# Patient Record
Sex: Female | Born: 1940
Health system: Southern US, Community
[De-identification: ages and names within clinical notes are randomized; demographics above are authoritative.]

## PROBLEM LIST (undated history)

## (undated) DIAGNOSIS — Z87898 Personal history of other specified conditions: Secondary | ICD-10-CM

## (undated) DIAGNOSIS — K219 Gastro-esophageal reflux disease without esophagitis: Secondary | ICD-10-CM

## (undated) DIAGNOSIS — M810 Age-related osteoporosis without current pathological fracture: Secondary | ICD-10-CM

## (undated) DIAGNOSIS — F418 Other specified anxiety disorders: Secondary | ICD-10-CM

## (undated) DIAGNOSIS — E039 Hypothyroidism, unspecified: Secondary | ICD-10-CM

## (undated) DIAGNOSIS — F419 Anxiety disorder, unspecified: Secondary | ICD-10-CM

## (undated) DIAGNOSIS — I4891 Unspecified atrial fibrillation: Secondary | ICD-10-CM

## (undated) HISTORY — DX: Other specified anxiety disorders: F41.8

## (undated) HISTORY — PX: VARICOSE VEIN SURGERY: SHX832

## (undated) HISTORY — DX: Unspecified atrial fibrillation: I48.91

## (undated) HISTORY — DX: Anxiety disorder, unspecified: F41.9

## (undated) HISTORY — PX: TOTAL ABDOMINAL HYSTERECTOMY: SHX209

## (undated) HISTORY — PX: MOUTH SURGERY: SHX715

## (undated) HISTORY — DX: Personal history of other specified conditions: Z87.898

## (undated) HISTORY — DX: Gastro-esophageal reflux disease without esophagitis: K21.9

## (undated) HISTORY — PX: SHOULDER ARTHROSCOPY: SHX128

## (undated) HISTORY — DX: Hemochromatosis, unspecified: E83.119

## (undated) HISTORY — DX: Hypothyroidism, unspecified: E03.9

## (undated) HISTORY — PX: COLONOSCOPY: SHX174

## (undated) HISTORY — PX: UPPER GASTROINTESTINAL ENDOSCOPY: SHX188

---

## 2003-07-09 ENCOUNTER — Encounter (INDEPENDENT_AMBULATORY_CARE_PROVIDER_SITE_OTHER): Payer: Self-pay | Admitting: *Deleted

## 2003-07-09 ENCOUNTER — Ambulatory Visit (HOSPITAL_COMMUNITY): Admission: RE | Admit: 2003-07-09 | Discharge: 2003-07-09 | Payer: Self-pay | Admitting: Surgery

## 2004-05-28 ENCOUNTER — Encounter: Admission: RE | Admit: 2004-05-28 | Discharge: 2004-05-28 | Payer: Self-pay | Admitting: Emergency Medicine

## 2004-10-26 ENCOUNTER — Ambulatory Visit: Payer: Self-pay | Admitting: Internal Medicine

## 2004-10-29 ENCOUNTER — Ambulatory Visit: Payer: Self-pay

## 2004-11-02 ENCOUNTER — Encounter (INDEPENDENT_AMBULATORY_CARE_PROVIDER_SITE_OTHER): Payer: Self-pay | Admitting: Specialist

## 2004-11-02 ENCOUNTER — Ambulatory Visit (HOSPITAL_BASED_OUTPATIENT_CLINIC_OR_DEPARTMENT_OTHER): Admission: RE | Admit: 2004-11-02 | Discharge: 2004-11-02 | Payer: Self-pay | Admitting: Otolaryngology

## 2004-11-02 ENCOUNTER — Ambulatory Visit (HOSPITAL_COMMUNITY): Admission: RE | Admit: 2004-11-02 | Discharge: 2004-11-02 | Payer: Self-pay | Admitting: Otolaryngology

## 2004-11-05 ENCOUNTER — Ambulatory Visit: Payer: Self-pay

## 2005-03-17 ENCOUNTER — Ambulatory Visit (HOSPITAL_COMMUNITY): Admission: RE | Admit: 2005-03-17 | Discharge: 2005-03-17 | Payer: Self-pay | Admitting: *Deleted

## 2005-03-17 ENCOUNTER — Encounter (INDEPENDENT_AMBULATORY_CARE_PROVIDER_SITE_OTHER): Payer: Self-pay | Admitting: *Deleted

## 2005-07-03 ENCOUNTER — Emergency Department (HOSPITAL_COMMUNITY): Admission: EM | Admit: 2005-07-03 | Discharge: 2005-07-03 | Payer: Self-pay | Admitting: Emergency Medicine

## 2006-01-16 ENCOUNTER — Ambulatory Visit: Payer: Self-pay | Admitting: Internal Medicine

## 2006-11-15 ENCOUNTER — Ambulatory Visit: Payer: Self-pay | Admitting: Internal Medicine

## 2006-11-22 ENCOUNTER — Ambulatory Visit: Payer: Self-pay

## 2008-03-12 ENCOUNTER — Ambulatory Visit: Payer: Self-pay | Admitting: Internal Medicine

## 2008-03-17 ENCOUNTER — Ambulatory Visit: Payer: Self-pay | Admitting: Internal Medicine

## 2008-04-10 ENCOUNTER — Ambulatory Visit: Payer: Self-pay | Admitting: Internal Medicine

## 2008-06-09 ENCOUNTER — Encounter: Admission: RE | Admit: 2008-06-09 | Discharge: 2008-06-09 | Payer: Self-pay | Admitting: Emergency Medicine

## 2008-10-20 DIAGNOSIS — I4891 Unspecified atrial fibrillation: Secondary | ICD-10-CM

## 2008-10-20 DIAGNOSIS — R55 Syncope and collapse: Secondary | ICD-10-CM

## 2008-10-20 DIAGNOSIS — I491 Atrial premature depolarization: Secondary | ICD-10-CM

## 2008-10-23 ENCOUNTER — Ambulatory Visit: Payer: Self-pay | Admitting: Internal Medicine

## 2009-05-18 ENCOUNTER — Ambulatory Visit: Payer: Self-pay | Admitting: Oncology

## 2009-06-15 ENCOUNTER — Encounter: Payer: Self-pay | Admitting: Internal Medicine

## 2009-06-15 LAB — CBC & DIFF AND RETIC
BASO%: 1.2 % (ref 0.0–2.0)
HCT: 37.6 % (ref 34.8–46.6)
Immature Retic Fract: 4.7 % (ref 0.00–10.70)
MCHC: 34.3 g/dL (ref 31.5–36.0)
MONO#: 0.5 10*3/uL (ref 0.1–0.9)
NEUT%: 56.6 % (ref 38.4–76.8)
RBC: 3.73 10*6/uL (ref 3.70–5.45)
Retic %: 0.98 % (ref 0.50–1.50)
Retic Ct Abs: 36.55 10*3/uL (ref 18.30–72.70)
WBC: 5.8 10*3/uL (ref 3.9–10.3)
lymph#: 1.6 10*3/uL (ref 0.9–3.3)
nRBC: 0 % (ref 0–0)

## 2009-06-15 LAB — IRON AND TIBC
Iron: 177 ug/dL — ABNORMAL HIGH (ref 42–145)
TIBC: 259 ug/dL (ref 250–470)
UIBC: 82 ug/dL

## 2009-06-15 LAB — COMPREHENSIVE METABOLIC PANEL
ALT: 24 U/L (ref 0–35)
AST: 25 U/L (ref 0–37)
Alkaline Phosphatase: 43 U/L (ref 39–117)
Creatinine, Ser: 0.61 mg/dL (ref 0.40–1.20)
Total Bilirubin: 0.6 mg/dL (ref 0.3–1.2)

## 2009-06-15 LAB — SEDIMENTATION RATE: Sed Rate: 7 mm/hr (ref 0–22)

## 2009-06-15 LAB — LACTATE DEHYDROGENASE: LDH: 169 U/L (ref 94–250)

## 2009-07-08 ENCOUNTER — Encounter (HOSPITAL_COMMUNITY): Admission: RE | Admit: 2009-07-08 | Discharge: 2009-10-06 | Payer: Self-pay | Admitting: Oncology

## 2009-07-31 ENCOUNTER — Encounter: Admission: RE | Admit: 2009-07-31 | Discharge: 2009-07-31 | Payer: Self-pay | Admitting: Emergency Medicine

## 2009-10-07 ENCOUNTER — Encounter: Admission: RE | Admit: 2009-10-07 | Discharge: 2009-10-07 | Payer: Self-pay | Admitting: Emergency Medicine

## 2009-10-09 ENCOUNTER — Encounter (HOSPITAL_COMMUNITY): Admission: RE | Admit: 2009-10-09 | Discharge: 2010-01-07 | Payer: Self-pay | Admitting: Oncology

## 2009-10-12 ENCOUNTER — Encounter: Admission: RE | Admit: 2009-10-12 | Discharge: 2009-10-12 | Payer: Self-pay | Admitting: Emergency Medicine

## 2009-12-04 ENCOUNTER — Ambulatory Visit: Payer: Self-pay | Admitting: Oncology

## 2009-12-08 LAB — CBC & DIFF AND RETIC
BASO%: 0.7 % (ref 0.0–2.0)
Basophils Absolute: 0 10*3/uL (ref 0.0–0.1)
EOS%: 2.5 % (ref 0.0–7.0)
Eosinophils Absolute: 0.1 10*3/uL (ref 0.0–0.5)
HCT: 36.2 % (ref 34.8–46.6)
HGB: 12.4 g/dL (ref 11.6–15.9)
Immature Retic Fract: 5.8 % (ref 0.00–10.70)
LYMPH%: 26.5 % (ref 14.0–49.7)
MCH: 34.9 pg — ABNORMAL HIGH (ref 25.1–34.0)
MCHC: 34.3 g/dL (ref 31.5–36.0)
MCV: 102 fL — ABNORMAL HIGH (ref 79.5–101.0)
MONO#: 0.4 10*3/uL (ref 0.1–0.9)
MONO%: 7.6 % (ref 0.0–14.0)
NEUT#: 3.5 10*3/uL (ref 1.5–6.5)
NEUT%: 62.7 % (ref 38.4–76.8)
Platelets: 211 10*3/uL (ref 145–400)
RBC: 3.55 10*6/uL — ABNORMAL LOW (ref 3.70–5.45)
RDW: 12.9 % (ref 11.2–14.5)
Retic %: 1.48 % (ref 0.50–1.50)
Retic Ct Abs: 52.54 10*3/uL (ref 18.30–72.70)
WBC: 5.5 10*3/uL (ref 3.9–10.3)
lymph#: 1.5 10*3/uL (ref 0.9–3.3)

## 2009-12-08 LAB — IRON AND TIBC
%SAT: 35 % (ref 20–55)
Iron: 90 ug/dL (ref 42–145)
TIBC: 256 ug/dL (ref 250–470)
UIBC: 166 ug/dL

## 2009-12-08 LAB — COMPREHENSIVE METABOLIC PANEL
ALT: 20 U/L (ref 0–35)
AST: 23 U/L (ref 0–37)
Albumin: 4.3 g/dL (ref 3.5–5.2)
Alkaline Phosphatase: 50 U/L (ref 39–117)
BUN: 11 mg/dL (ref 6–23)
CO2: 24 mEq/L (ref 19–32)
Calcium: 8.9 mg/dL (ref 8.4–10.5)
Chloride: 105 mEq/L (ref 96–112)
Creatinine, Ser: 0.63 mg/dL (ref 0.40–1.20)
Glucose, Bld: 127 mg/dL — ABNORMAL HIGH (ref 70–99)
Potassium: 3.5 mEq/L (ref 3.5–5.3)
Sodium: 141 mEq/L (ref 135–145)
Total Bilirubin: 0.4 mg/dL (ref 0.3–1.2)
Total Protein: 6.6 g/dL (ref 6.0–8.3)

## 2009-12-08 LAB — LACTATE DEHYDROGENASE: LDH: 184 U/L (ref 94–250)

## 2009-12-08 LAB — FERRITIN: Ferritin: 117 ng/mL (ref 10–291)

## 2009-12-15 ENCOUNTER — Encounter: Payer: Self-pay | Admitting: Internal Medicine

## 2010-02-04 ENCOUNTER — Encounter (HOSPITAL_COMMUNITY)
Admission: RE | Admit: 2010-02-04 | Discharge: 2010-03-23 | Payer: Self-pay | Source: Home / Self Care | Admitting: Oncology

## 2010-03-16 ENCOUNTER — Encounter: Admission: RE | Admit: 2010-03-16 | Discharge: 2010-03-16 | Payer: Self-pay | Admitting: Emergency Medicine

## 2010-04-22 ENCOUNTER — Ambulatory Visit: Payer: Self-pay | Admitting: Internal Medicine

## 2010-04-26 ENCOUNTER — Encounter (HOSPITAL_COMMUNITY)
Admission: RE | Admit: 2010-04-26 | Discharge: 2010-07-25 | Payer: Self-pay | Source: Home / Self Care | Attending: Oncology | Admitting: Oncology

## 2010-06-04 ENCOUNTER — Ambulatory Visit: Payer: Self-pay | Admitting: Oncology

## 2010-06-08 LAB — COMPREHENSIVE METABOLIC PANEL
ALT: 12 U/L (ref 0–35)
AST: 17 U/L (ref 0–37)
Albumin: 4.1 g/dL (ref 3.5–5.2)
Alkaline Phosphatase: 55 U/L (ref 39–117)
BUN: 14 mg/dL (ref 6–23)
CO2: 26 mEq/L (ref 19–32)
Calcium: 9.3 mg/dL (ref 8.4–10.5)
Chloride: 106 mEq/L (ref 96–112)
Creatinine, Ser: 0.7 mg/dL (ref 0.40–1.20)
Glucose, Bld: 91 mg/dL (ref 70–99)
Potassium: 4.3 mEq/L (ref 3.5–5.3)
Sodium: 141 mEq/L (ref 135–145)
Total Bilirubin: 0.3 mg/dL (ref 0.3–1.2)
Total Protein: 6.6 g/dL (ref 6.0–8.3)

## 2010-06-08 LAB — LACTATE DEHYDROGENASE: LDH: 172 U/L (ref 94–250)

## 2010-06-08 LAB — CBC & DIFF AND RETIC
BASO%: 0.6 % (ref 0.0–2.0)
Basophils Absolute: 0 10*3/uL (ref 0.0–0.1)
EOS%: 3 % (ref 0.0–7.0)
Eosinophils Absolute: 0.2 10*3/uL (ref 0.0–0.5)
HCT: 36.2 % (ref 34.8–46.6)
HGB: 12.6 g/dL (ref 11.6–15.9)
Immature Retic Fract: 5.4 % (ref 0.00–10.70)
LYMPH%: 24.3 % (ref 14.0–49.7)
MCH: 34.9 pg — ABNORMAL HIGH (ref 25.1–34.0)
MCHC: 34.8 g/dL (ref 31.5–36.0)
MCV: 100.3 fL (ref 79.5–101.0)
MONO#: 0.4 10*3/uL (ref 0.1–0.9)
MONO%: 6.9 % (ref 0.0–14.0)
NEUT#: 4.2 10*3/uL (ref 1.5–6.5)
NEUT%: 65.2 % (ref 38.4–76.8)
Platelets: 243 10*3/uL (ref 145–400)
RBC: 3.61 10*6/uL — ABNORMAL LOW (ref 3.70–5.45)
RDW: 12.7 % (ref 11.2–14.5)
Retic %: 0.7 % (ref 0.50–1.50)
Retic Ct Abs: 25.27 10*3/uL (ref 18.30–72.70)
WBC: 6.4 10*3/uL (ref 3.9–10.3)
lymph#: 1.6 10*3/uL (ref 0.9–3.3)
nRBC: 0 % (ref 0–0)

## 2010-06-08 LAB — IRON AND TIBC
%SAT: 46 % (ref 20–55)
Iron: 114 ug/dL (ref 42–145)
TIBC: 248 ug/dL — ABNORMAL LOW (ref 250–470)
UIBC: 134 ug/dL

## 2010-06-08 LAB — FERRITIN: Ferritin: 36 ng/mL (ref 10–291)

## 2010-06-15 ENCOUNTER — Encounter: Payer: Self-pay | Admitting: Internal Medicine

## 2010-07-15 ENCOUNTER — Encounter
Admission: RE | Admit: 2010-07-15 | Discharge: 2010-07-15 | Payer: Self-pay | Source: Home / Self Care | Attending: Emergency Medicine | Admitting: Emergency Medicine

## 2010-07-21 LAB — HEMOGLOBIN AND HEMATOCRIT, BLOOD
HCT: 41.6 % (ref 36.0–46.0)
Hemoglobin: 14.3 g/dL (ref 12.0–15.0)

## 2010-07-21 LAB — FERRITIN: Ferritin: 60 ng/mL (ref 10–291)

## 2010-07-27 NOTE — Letter (Signed)
[facility]   Imported By: Roderic Ovens 07/20/2009 10:16:47  _____________________________________________________________________  External Attachment:    Type:   Image     Comment:   External Document

## 2010-07-27 NOTE — Letter (Signed)
[facility]   Imported By: Roderic Ovens 01/29/2010 14:02:22  _____________________________________________________________________  External Attachment:    Type:   Image     Comment:   External Document

## 2010-07-27 NOTE — Assessment & Plan Note (Signed)
Summary: pt wants checkup/past due appt   CC:  check up.  History of Present Illness: [name] is seen in followup for atrial fibrillation. This is the largely quiescent on  flecainide therapy.   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   She is now undergoing phlebotomy monthly     He does have occasional palpitations associated with lightheadedness. We tried to use a 30 day event recorder in t. Unfortunately we did not get a very good data and the one date on which he had symptoms she had only an isolated PAC.  The symptoms occur primarily while standing; it occurred occasionally while sitting and driving.  Current Medications (verified): 1)  Flecainide Acetate 100 Mg Tabs (Flecainide Acetate) .... Take 1 Tablet Twice A Day 2)  Synthroid 112 Mcg Tabs (Levothyroxine Sodium) .... Take 1 By Mouth Once Daily 3)  Nexium 40 Mg Cpdr (Esomeprazole Magnesium) .... Take 1 By Mouth Once Daily 4)  Folic Acid 1 Mg Tabs (Folic Acid) .... Take 1 By Mouth Once Daily 5)  B-12 Injection .... Every Month 6)  Vitamin D .... Weekly 7)  Caltrate 600 1500 Mg Tabs (Calcium Carbonate) .... Take Once Daily 8)  Celexa 10 Mg Tabs (Citalopram Hydrobromide) .Marland Kitchen.. 1 Tab By Mouth Once Daily 9)  Aspirin 81 Mg Tbec (Aspirin) .... Take One Tablet By Mouth Daily  Allergies: No Known Drug Allergies  Past History:  Past Surgical History: Last updated: 10/20/2008 Tonsillectomy hysterectomy - 1971  Social History: Last updated: 10/20/2008 Retired  Tobacco Use - Former. - 1999 Alcohol Use - yes - 2 daily Regular Exercise - yes Drug Use - no  Past Medical History: Atrial fibrillation Syncope Hemochromatosis-homozygous-C282Y gene  Family History: hemochromatosis  Vital Signs:  Patient profile:   70 year  old female Height:      64 inches Weight:      143 pounds BMI:     24.63 Pulse rate:   70 / minute Resp:     12 per minute BP sitting:   118 / 78  (left arm)  Vitals Entered By: Kem Parkinson (April 22, 2010 11:24 AM)  Physical Exam  General:  The patient was alert and oriented in no acute distress. HEENT Normal.  Neck veins were flat, carotids were brisk.  Lungs were clear.  Heart sounds were regular without murmurs or gallops.  Abdomen was soft with active bowel sounds. There is no clubbing cyanosis or edema. Skin Warm and dry    EKG  Procedure date:  04/22/2010  Findings:      sinus rhythm at 70 Intervals 0.16/0.07/0.40 Axis is -17 Low-voltage  Impression & Recommendations:  Problem # 1:  ATRIAL FIBRILLATION (ICD-427.31) stabel Her updated medication list for this problem includes:    Flecainide Acetate 100 Mg Tabs (Flecainide acetate) .Marland Kitchen... Take 1 tablet twice a day    Aspirin 81 Mg Tbec (Aspirin) .Marland Kitchen... Take one tablet by mouth daily  Orders: EKG w/ Interpretation (93000)  Problem # 2:  HEREDITARY HEMOCHROMATOSIS (ICD-275.01) we discussed at great length hemochromatosis and its potential effects. Holding to further review it is a mildly low voltage might be a manifestation although the current therapy of phlebotomy is clearly won't be begun in any case.  Patient Instructions: 1)  Your physician recommends that you schedule  a follow-up appointment in: YEAR WITH DR Graciela Husbands 2)  Your physician recommends that you continue on your current medications as directed. Please refer to the Current Medication list given to you today.

## 2010-07-29 NOTE — Letter (Signed)
Summary: [facility]  [facility]   Imported By: Lester Woodbury 06/30/2010 10:22:06  _____________________________________________________________________  External Attachment:    Type:   Image     Comment:   External Document

## 2010-08-04 ENCOUNTER — Other Ambulatory Visit: Payer: Self-pay | Admitting: Emergency Medicine

## 2010-08-04 DIAGNOSIS — M25551 Pain in right hip: Secondary | ICD-10-CM

## 2010-08-07 ENCOUNTER — Other Ambulatory Visit: Payer: Self-pay

## 2010-08-11 ENCOUNTER — Ambulatory Visit
Admission: RE | Admit: 2010-08-11 | Discharge: 2010-08-11 | Disposition: A | Discharge status: Home / Self Care | Payer: Medicare Other | Source: Ambulatory Visit | Attending: Emergency Medicine | Admitting: Emergency Medicine

## 2010-08-11 DIAGNOSIS — M25551 Pain in right hip: Secondary | ICD-10-CM

## 2010-08-20 ENCOUNTER — Telehealth: Payer: Self-pay | Admitting: Internal Medicine

## 2010-09-02 NOTE — Progress Notes (Signed)
Summary: pt calling re heat waves-sob  Phone Note Call from Patient   Caller: Patient [phone] or [phone] Reason for Call: Talk to Nurse Summary of Call: feeling like heat waves-sob-x 2 weeks-denies chest pain-not sure heart related- Initial call taken by: Glynda Jaeger,  August 20, 2010 10:35 AM  Follow-up for Phone Call        Phone Call Completed SPOKE WITH PT FEELS OKAY AT THIS TIME S/S NOTED OVER 2 WEEK PERIOD. INSTRUCTED TO F/U WITH PMD DOES NOT SOUND HEART RELATED. PER PT NO CHESTPAIN,B/P RUNNING GOOD ,FEELS HEART IS IN NORMAL RYTHM NO SWELLING NOTED PT VERBALIZED UNDERSTANDING . INFORMED TO CALL BACK I F S/S INCREASE OR CHANGE . Follow-up by: Scherrie Bateman, LPN,  August 20, 2010 11:34 AM  Additional Follow-up for Phone Call Additional follow up Details #1::        agree  Additional Follow-up by: Nathen May, MD, [facility],  August 23, 2010 6:21 PM

## 2010-09-07 LAB — FERRITIN: Ferritin: 80 ng/mL (ref 10–291)

## 2010-09-07 LAB — HEMOGLOBIN AND HEMATOCRIT, BLOOD
HCT: 39.3 % (ref 36.0–46.0)
Hemoglobin: 13.6 g/dL (ref 12.0–15.0)

## 2010-09-10 LAB — FERRITIN: Ferritin: 58 ng/mL (ref 10–291)

## 2010-09-10 LAB — HEMOGLOBIN AND HEMATOCRIT, BLOOD
HCT: 40 % (ref 36.0–46.0)
Hemoglobin: 14.1 g/dL (ref 12.0–15.0)

## 2010-09-12 LAB — HEMOGLOBIN AND HEMATOCRIT, BLOOD
HCT: 37.9 % (ref 36.0–46.0)
Hemoglobin: 12.9 g/dL (ref 12.0–15.0)

## 2010-09-12 LAB — FERRITIN: Ferritin: 253 ng/mL (ref 10–291)

## 2010-09-13 LAB — HEMOGLOBIN AND HEMATOCRIT, BLOOD
HCT: 40.1 % (ref 36.0–46.0)
Hemoglobin: 13.7 g/dL (ref 12.0–15.0)

## 2010-09-13 LAB — FERRITIN: Ferritin: 216 ng/mL (ref 10–291)

## 2010-09-14 LAB — HEMOGLOBIN AND HEMATOCRIT, BLOOD: HCT: 39.5 % (ref 36.0–46.0)

## 2010-09-16 LAB — HEMOGLOBIN AND HEMATOCRIT, BLOOD: Hemoglobin: 12.7 g/dL (ref 12.0–15.0)

## 2010-09-16 LAB — FERRITIN: Ferritin: 248 ng/mL (ref 10–291)

## 2010-10-05 ENCOUNTER — Ambulatory Visit
Admission: RE | Admit: 2010-10-05 | Discharge: 2010-10-05 | Disposition: A | Discharge status: Home / Self Care | Payer: Medicare Other | Source: Ambulatory Visit | Attending: Emergency Medicine | Admitting: Emergency Medicine

## 2010-10-05 ENCOUNTER — Encounter: Payer: Self-pay | Admitting: Internal Medicine

## 2010-10-05 ENCOUNTER — Encounter: Payer: Self-pay | Admitting: Physician Assistant

## 2010-10-05 ENCOUNTER — Other Ambulatory Visit: Payer: Self-pay | Admitting: Emergency Medicine

## 2010-10-05 DIAGNOSIS — R072 Precordial pain: Secondary | ICD-10-CM

## 2010-10-06 ENCOUNTER — Encounter: Payer: Self-pay | Admitting: Physician Assistant

## 2010-10-06 ENCOUNTER — Other Ambulatory Visit: Payer: Self-pay | Admitting: Emergency Medicine

## 2010-10-06 ENCOUNTER — Ambulatory Visit (INDEPENDENT_AMBULATORY_CARE_PROVIDER_SITE_OTHER): Payer: Medicare Other | Admitting: Physician Assistant

## 2010-10-06 VITALS — BP 112/80 | HR 78 | Ht 64.0 in | Wt 142.0 lb

## 2010-10-06 DIAGNOSIS — R110 Nausea: Secondary | ICD-10-CM

## 2010-10-06 DIAGNOSIS — R079 Chest pain, unspecified: Secondary | ICD-10-CM

## 2010-10-06 DIAGNOSIS — R0602 Shortness of breath: Secondary | ICD-10-CM | POA: Insufficient documentation

## 2010-10-06 DIAGNOSIS — E039 Hypothyroidism, unspecified: Secondary | ICD-10-CM | POA: Insufficient documentation

## 2010-10-06 DIAGNOSIS — R002 Palpitations: Secondary | ICD-10-CM

## 2010-10-06 DIAGNOSIS — I4891 Unspecified atrial fibrillation: Secondary | ICD-10-CM

## 2010-10-06 NOTE — Assessment & Plan Note (Signed)
She is currently maintaining sinus rhythm.  If her monitor demonstrates more frequent atrial fibrillation and her Myoview study rules out ischemic heart disease, she may be able to increase her flecainide.

## 2010-10-06 NOTE — Assessment & Plan Note (Addendum)
Her chest symptoms are somewhat atypical for ischemia.  However, they were severe.  Her EKG is unchanged.  She does have hemochromatosis.  I would think that she is at risk for pulmonary emboli.  She did have a recent trip to [place].  Her PCP did order a d-dimer.  Those results are not back yet.  I would have a low threshold to proceed with chest CT to rule out pulmonary embolism.  Given her family history, I would suggest that we proceed with a stress Myoview study.  If she has ischemic heart disease, she would not be a candidate for flecainide, which he is currently taking.  I will have her follow up with Dr. Graciela Husbands in the next couple weeks.  She knows to go to the emergency room if she has recurrent or severe symptoms.  I did speak to Dr. Lorenz Coaster.  Her DDimer was negative and her troponin was negative.

## 2010-10-06 NOTE — Patient Instructions (Signed)
Your physician has requested that you have en exercise stress Myoview 786.50, 786.05. For further information please visit [email or web address]. Please follow instruction sheet, as given.  Your physician has requested that you have an echocardiogram 786.05, 427.31. Echocardiography is a painless test that uses sound waves to create images of your heart. It provides your doctor with information about the size and shape of your heart and how well your heart's chambers and valves are working. This procedure takes approximately one hour. There are no restrictions for this procedure.   Your physician has recommended that you wear an event monitor 785.1. Event monitors are medical devices that record the heart's electrical activity. Doctors most often [name] these monitors to diagnose arrhythmias. Arrhythmias are problems with the speed or rhythm of the heartbeat. The monitor is a small, portable device. You can wear one while you do your normal daily activities. This is usually used to diagnose what is causing palpitations/syncope (passing out).  Your physician recommends that you schedule a follow-up appointment in: 2 WEEKS WITH DR. Graciela Husbands AS PER SCOTT WEAVER, PA-C.

## 2010-10-06 NOTE — Assessment & Plan Note (Signed)
She has noted some increased shortness of breath recently.  Her volume is stable on exam.  I will obtain an echocardiogram as well.

## 2010-10-06 NOTE — Assessment & Plan Note (Signed)
She may be having recurrent atrial fibrillation.  It has been over 2 years since she last wore a monitor.  We will obtain a 21 day event monitor as well.  I have asked her PCP to also add a TSH to her labs.

## 2010-10-06 NOTE — Progress Notes (Signed)
History of Present Illness: Primary Electrophysiologist:  Dr. Sherryl Manges  [name] is a 70 y.o. female with a h/o atrial fibrillation largely controlled by Flecainide and hereditary hemochromatosis.  Echo done in 2006 demonstrated normal LVF.  Myoview in 2008 was low risk.  When last seen in 03/2010 by Dr. Graciela Husbands she was maintaining NSR.  She is referred back by her PCP for chest pain.    3 days ago, she developed sharp substernal chest pain while at rest.  It radiated to her back.  She felt like she pulled a muscle.  The pain lasted most of the day.  It occurred again yesterday while she was just pouring coffee.  It again occurred throughout the day.  She had some diaphoresis and shortness of breath.  She denies syncope.  She denies exertional chest pain.  She does have some dyspnea with exertion.  This seems to have been present over the last couple of months.  She probably describes NYHA class II symptoms.  She has had chronic palpitations over the years even while on flecainide.  However, over the last 6 months or so they've increased in frequency.  She denies orthopnea, PND or significant pedal edema.  She denies hemoptysis.  Of note, she did travel to [place] a few weeks ago by airplane.  EKG done at her PCP's office yesterday was non-acute.  CXR done was also unremarkable.  She had some labs drawn but we do not have those results yet.  Past Medical History  Diagnosis Date  . Atrial fibrillation     Flecainide therapy  . Hemochromatosis     indentified by the C282Y gene mutation; Dr. Cyndie Chime  . History of syncope   . Hypothyroidism   . Depression with anxiety   . GERD (gastroesophageal reflux disease)    Past Surgical History  Procedure Date  . Total abdominal hysterectomy     Current Outpatient Prescriptions  Medication Sig Dispense Refill  . aspirin 81 MG EC tablet Take 81 mg by mouth daily.        Marland Kitchen buPROPion (WELLBUTRIN SR) 150 MG 12 hr tablet Take 150 mg by mouth 2  (two) times daily.        . Calcium Carbonate 1500 MG TABS Take 1 tablet by mouth daily.        . Cholecalciferol (VITAMIN D) 1000 UNITS capsule Take 1,000 Units by mouth daily.        Marland Kitchen esomeprazole (NEXIUM) 40 MG capsule Take 40 mg by mouth daily before breakfast.        . flecainide (TAMBOCOR) 100 MG tablet Take 100 mg by mouth. 1/2 tablet in the morning and 1/2 tablet in the evening        . folic acid (FOLVITE) 1 MG tablet Take 1 mg by mouth daily.        Marland Kitchen levothyroxine (SYNTHROID, LEVOTHROID) 112 MCG tablet Take 112 mcg by mouth daily.        Marland Kitchen VITAMIN B1-B12 IM Inject 1,000 mcg into the muscle every 30 (thirty) days.        Marland Kitchen DISCONTD: citalopram (CELEXA) 10 MG tablet Take 10 mg by mouth daily.        Marland Kitchen DISCONTD: ergocalciferol (VITAMIN D2) 50000 UNITS capsule Take 50,000 Units by mouth once a week.        Marland Kitchen DISCONTD: flecainide (TAMBOCOR) 100 MG tablet Take 100 mg by mouth 2 (two) times daily.          No  Known Allergies  History  Substance Use Topics  . Smoking status: Former Smoker    Quit date: 06/27/1997  . Smokeless tobacco: Not on file  . Alcohol Use: 0.5 oz/week    1 drink(s) per week     2 DRINKS DAILY    Family History  Problem Relation Age of Onset  . Stroke Mother   . Heart disease Father 56  . Hemochromatosis Brother     ROS:  Please see the history of present illness.  She denies fevers, chills, cough, melena, hematochezia, weight changes.  All other systems reviewed and negative.  Vital Signs: BP 112/80  Pulse 78  Ht 5\' 4"  (1.626 m)  Wt 142 lb (64.411 kg)  BMI 24.37 kg/m2  PHYSICAL EXAM: Well nourished, well developed, in no acute distress HEENT: normal Neck: no JVD Vascular: No carotid bruits Endocrine: No thyromegaly Cardiac:  normal S1, S2; RRR; no murmur Lungs:  clear to auscultation bilaterally, no wheezing, rhonchi or rales Abd: soft, nontender, no hepatomegaly Ext: no edema Skin: warm and dry Neuro:  CNs 2-12 intact, no focal  abnormalities noted Psych: Normal affect  EKG:  Normal sinus rhythm, heart rate 78, left axis deviation, nonspecific ST-T wave changes, no significant change when compared to prior tracings  ASSESSMENT AND PLAN:

## 2010-10-07 ENCOUNTER — Encounter: Payer: Self-pay | Admitting: Physician Assistant

## 2010-10-07 ENCOUNTER — Ambulatory Visit
Admission: RE | Admit: 2010-10-07 | Discharge: 2010-10-07 | Disposition: A | Discharge status: Home / Self Care | Payer: Medicare Other | Source: Ambulatory Visit | Attending: Emergency Medicine | Admitting: Emergency Medicine

## 2010-10-07 DIAGNOSIS — R110 Nausea: Secondary | ICD-10-CM

## 2010-10-08 ENCOUNTER — Other Ambulatory Visit: Payer: Self-pay | Admitting: Emergency Medicine

## 2010-10-08 ENCOUNTER — Other Ambulatory Visit (HOSPITAL_COMMUNITY): Payer: Self-pay | Admitting: Emergency Medicine

## 2010-10-08 DIAGNOSIS — R7989 Other specified abnormal findings of blood chemistry: Secondary | ICD-10-CM

## 2010-10-08 DIAGNOSIS — R110 Nausea: Secondary | ICD-10-CM

## 2010-10-08 DIAGNOSIS — D1800 Hemangioma unspecified site: Secondary | ICD-10-CM

## 2010-10-08 DIAGNOSIS — R1011 Right upper quadrant pain: Secondary | ICD-10-CM

## 2010-10-11 ENCOUNTER — Other Ambulatory Visit: Payer: Medicare Other

## 2010-10-12 ENCOUNTER — Other Ambulatory Visit: Payer: Self-pay | Admitting: Oncology

## 2010-10-12 ENCOUNTER — Encounter (HOSPITAL_COMMUNITY): Payer: Medicare Other | Attending: Oncology

## 2010-10-12 LAB — HEMOGLOBIN AND HEMATOCRIT, BLOOD: Hemoglobin: 13.8 g/dL (ref 12.0–15.0)

## 2010-10-14 ENCOUNTER — Encounter (HOSPITAL_COMMUNITY): Payer: Self-pay | Admitting: Radiology

## 2010-10-14 ENCOUNTER — Ambulatory Visit (INDEPENDENT_AMBULATORY_CARE_PROVIDER_SITE_OTHER): Payer: Medicare Other

## 2010-10-14 ENCOUNTER — Ambulatory Visit (HOSPITAL_COMMUNITY): Payer: Medicare Other | Attending: Internal Medicine

## 2010-10-14 ENCOUNTER — Ambulatory Visit (HOSPITAL_BASED_OUTPATIENT_CLINIC_OR_DEPARTMENT_OTHER): Payer: Medicare Other | Admitting: Radiology

## 2010-10-14 DIAGNOSIS — R0609 Other forms of dyspnea: Secondary | ICD-10-CM | POA: Insufficient documentation

## 2010-10-14 DIAGNOSIS — I079 Rheumatic tricuspid valve disease, unspecified: Secondary | ICD-10-CM | POA: Insufficient documentation

## 2010-10-14 DIAGNOSIS — R079 Chest pain, unspecified: Secondary | ICD-10-CM

## 2010-10-14 DIAGNOSIS — I4891 Unspecified atrial fibrillation: Secondary | ICD-10-CM | POA: Insufficient documentation

## 2010-10-14 DIAGNOSIS — R0602 Shortness of breath: Secondary | ICD-10-CM

## 2010-10-14 DIAGNOSIS — I491 Atrial premature depolarization: Secondary | ICD-10-CM

## 2010-10-14 DIAGNOSIS — R002 Palpitations: Secondary | ICD-10-CM

## 2010-10-14 DIAGNOSIS — I059 Rheumatic mitral valve disease, unspecified: Secondary | ICD-10-CM | POA: Insufficient documentation

## 2010-10-14 DIAGNOSIS — R0989 Other specified symptoms and signs involving the circulatory and respiratory systems: Secondary | ICD-10-CM | POA: Insufficient documentation

## 2010-10-14 DIAGNOSIS — R072 Precordial pain: Secondary | ICD-10-CM | POA: Insufficient documentation

## 2010-10-14 MED ORDER — TECHNETIUM TC 99M TETROFOSMIN IV KIT
33.0000 | PACK | Freq: Once | INTRAVENOUS | Status: AC | PRN
  Administered 2010-10-14: 33 via INTRAVENOUS

## 2010-10-14 MED ORDER — TECHNETIUM TC 99M TETROFOSMIN IV KIT
11.0000 | PACK | Freq: Once | INTRAVENOUS | Status: AC | PRN
  Administered 2010-10-14: 11 via INTRAVENOUS

## 2010-10-14 NOTE — Progress Notes (Signed)
[facility] SITE 3 NUCLEAR MED [address] [place] [address] [phone]  Cardiology Nuclear Med Study  [name] is a 70 y.o. female [id number] 1940/11/14   Nuclear Med Background Indication for Stress Test:  Evaluation for Ischemia History: 2006  Echo -Nml and 5/08 Myocardial Perfusion Study-Nml Cardiac Risk Factors: Family History - CAD and History of Smoking  Symptoms:  Chest Pain- last episode-last week, DOE, Fatigue with Exertion, Light-Headedness, Palpitations and SOB   Nuclear Pre-Procedure Caffeine/Decaff Intake:  None NPO After: 7:30pm   Lungs:  clear IV 0.9% NS with Angio Cath:  20g  IV Site: R Antecubital  IV Started by:  Bonnita Levan, RN  Chest Size (in):  38 Cup Size: C  Height: 5\' 4"  (1.626 m)  Weight:  140 lb (63.504 kg)  BMI:  Body mass index is 24.03 kg/(m^2). Tech Comments:  N/A    Nuclear Med Study 1 or 2 day study: 1 day  Stress Test Type:  Stress  Reading MD: Olga Millers, MD  Order Authorizing Provider:  Dr. Berton Mount  Resting Radionuclide: Technetium 41m Tetrofosmin  Resting Radionuclide Dose: 11.0 mCi   Stress Radionuclide:  Technetium 69m Tetrofosmin  Stress Radionuclide Dose: 33.0 mCi           Stress Protocol Rest HR: 75 Stress HR: 144  Rest BP: 132/81 Stress BP: 147/67  Exercise Time (min): 7:32 METS: 9.3     % Max HR: 95.36 bpm    Dose of Adenosine (mg):  n/a Dose of Lexiscan: n/a mg  Dose of Atropine (mg): n/a Dose of Dobutamine: n/a mcg/kg/min (at max HR)  Stress Test Technologist: Frederick Peers, EMT-P  Nuclear Technologist:  Doyne Keel, CNMT     Rest Procedure:  Myocardial perfusion imaging was performed at rest 45 minutes following the intravenous administration of Technetium 53m Tetrofosmin. Rest ECG: NSR  Stress Procedure:  The patient exercised for 7:72mins.  The patient stopped due to SOB/leg fatigue and denied any chest pain(pt felt chest burning"" like when you run"left as quick as she  sat down at rest..  There were non specific ST-T wave changes/occ PACs/PVCS.  Technetium 75m Tetrofosmin was injected at peak exercise and myocardial perfusion imaging was performed after a brief delay. Stress ECG: No significant ST segment change suggestive of ischemia.  QPS Raw Data Images:  Acquisition technically good; normal left ventricular size. Stress Images:  Normal homogeneous uptake in all areas of the myocardium. Rest Images:  Normal homogeneous uptake in all areas of the myocardium. Subtraction (SDS):  No evidence of ischemia. Transient Ischemic Dilatation (Normal <1.22):  0.84 Lung/Heart Ratio (Normal <0.45):  0.30  Quantitative Gated Spect Images QGS EDV:  43 ml QGS ESV:  6 ml QGS cine images:  Normal Wall Motion QGS EF: 86%  Impression Exercise Capacity:  Fair exercise capacity. BP Response:  Normal blood pressure response. Clinical Symptoms:  There is chest burning. ECG Impression:  No significant ST segment change suggestive of ischemia. Comparison with Prior Nuclear Study: No significant change from previous study  Overall Impression:  Normal stress nuclear study.  Olga Millers

## 2010-10-15 NOTE — Progress Notes (Addendum)
Copy routed to Dr. Graciela Husbands.[name]  Please Inform Patient normal study  Thanks

## 2010-10-18 ENCOUNTER — Encounter (HOSPITAL_COMMUNITY)
Admission: RE | Admit: 2010-10-18 | Discharge: 2010-10-18 | Disposition: A | Discharge status: Home / Self Care | Payer: Medicare Other | Source: Ambulatory Visit | Attending: Emergency Medicine | Admitting: Emergency Medicine

## 2010-10-18 DIAGNOSIS — R1011 Right upper quadrant pain: Secondary | ICD-10-CM | POA: Insufficient documentation

## 2010-10-18 DIAGNOSIS — R7402 Elevation of levels of lactic acid dehydrogenase (LDH): Secondary | ICD-10-CM | POA: Insufficient documentation

## 2010-10-18 DIAGNOSIS — R7401 Elevation of levels of liver transaminase levels: Secondary | ICD-10-CM | POA: Insufficient documentation

## 2010-10-18 DIAGNOSIS — R110 Nausea: Secondary | ICD-10-CM | POA: Insufficient documentation

## 2010-10-18 MED ORDER — TECHNETIUM TC 99M MEBROFENIN IV KIT
5.0000 | PACK | Freq: Once | INTRAVENOUS | Status: AC | PRN
  Administered 2010-10-18: 5 via INTRAVENOUS

## 2010-10-21 ENCOUNTER — Ambulatory Visit: Payer: Medicare Other | Admitting: Physician Assistant

## 2010-10-26 ENCOUNTER — Ambulatory Visit (INDEPENDENT_AMBULATORY_CARE_PROVIDER_SITE_OTHER): Payer: Medicare Other | Admitting: Physician Assistant

## 2010-10-26 ENCOUNTER — Encounter: Payer: Self-pay | Admitting: Physician Assistant

## 2010-10-26 VITALS — BP 118/78 | HR 72 | Ht 64.0 in | Wt 140.0 lb

## 2010-10-26 DIAGNOSIS — I4891 Unspecified atrial fibrillation: Secondary | ICD-10-CM

## 2010-10-26 DIAGNOSIS — R079 Chest pain, unspecified: Secondary | ICD-10-CM

## 2010-10-26 NOTE — Assessment & Plan Note (Signed)
Atypical.  With normal Myoview and Echo, this appears to be non-cardiac.  Continue with follow up with PCP to look for other reasons.

## 2010-10-26 NOTE — Patient Instructions (Signed)
Your physician recommends that you schedule a follow-up appointment in: 2 months with Dr Logan Bores may stop wearing the monitor and mail it back in.

## 2010-10-26 NOTE — Progress Notes (Signed)
History of Present Illness: Primary Electrophysiologist:  Dr. Sherryl Manges  [name] is a 70 y.o. female with a h/o atrial fibrillation largely controlled by Flecainide and hereditary hemochromatosis.  Echo done in 2006 demonstrated normal LVF.  Myoview in 2008 was low risk.  When last seen in 03/2010 by Dr. Graciela Husbands she was maintaining NSR.  I saw her 10/06/10 for chest pain.  A d-dimer done by her PCP was negative.  I set her up for a stress Myoview study which was done on 10/14/10 that demonstrated no ischemia and an EF of 86%.  I also obtained an echocardiogram that demonstrated an EF of 55-60%, Grade one diastolic dysfunction and LAE.  She is wearing an event monitor.  This has demonstrated mainly NSR with PACs.  She did have 2 brief episodes of WCT.  I reviewed this with Dr. Graciela Husbands.  He axis did not change and this appeared to be preceded by a PAC.  With normal LVF and no ventricular arrhythmias with exercise during her GXT myoview, this seems to be paroxysmal atrial tachycardia (possibly atrial fibrillation).    She is doing well.  Her PCP has worked her up for gallbladder disease which has been negative.  She did have some elevated LFTs and has follow up pending.  She denies exertional chest pain or dyspnea.  No syncope or near syncope.  No orthopnea or PND or edema.  She is eager to get her monitor off.  Her palpitations are infrequent now.  Past Medical History  Diagnosis Date  . Atrial fibrillation     Flecainide therapy  . Hemochromatosis     indentified by the C282Y gene mutation; Dr. Cyndie Chime  . History of syncope   . Hypothyroidism   . Depression with anxiety   . GERD (gastroesophageal reflux disease)    Past Surgical History  Procedure Date  . Total abdominal hysterectomy     Current Outpatient Prescriptions  Medication Sig Dispense Refill  . aspirin 81 MG EC tablet Take 81 mg by mouth daily.        Marland Kitchen buPROPion (WELLBUTRIN SR) 150 MG 12 hr tablet Take 150 mg by mouth  daily.       . Calcium Carbonate 1500 MG TABS Take 1 tablet by mouth daily.        . Cholecalciferol (VITAMIN D) 1000 UNITS capsule Take 1,000 Units by mouth daily.        Marland Kitchen esomeprazole (NEXIUM) 40 MG capsule Take 40 mg by mouth daily before breakfast.        . flecainide (TAMBOCOR) 100 MG tablet Take 100 mg by mouth. 1/2 tablet in the morning and 1/2 tablet in the evening        . folic acid (FOLVITE) 1 MG tablet Take 1 mg by mouth daily.        Marland Kitchen levothyroxine (SYNTHROID, LEVOTHROID) 112 MCG tablet Take 112 mcg by mouth daily.        Marland Kitchen VITAMIN B1-B12 IM Inject 1,000 mcg into the muscle every 30 (thirty) days.          No Known Allergies  Vital Signs: BP 118/78  Pulse 72  Ht 5\' 4"  (1.626 m)  Wt 140 lb (63.504 kg)  BMI 24.03 kg/m2  PHYSICAL EXAM: Well nourished, well developed, in no acute distress HEENT: normal Neck: no JVD Cardiac:  normal S1, S2; RRR; no murmur Lungs:  clear to auscultation bilaterally, no wheezing, rhonchi or rales Abd: soft, nontender, no hepatomegaly Ext: no edema  Skin: warm and dry Neuro:  CNs 2-12 intact, no focal abnormalities noted  ASSESSMENT AND PLAN:

## 2010-10-26 NOTE — Assessment & Plan Note (Signed)
As noted, she had 2 episodes of wide complex tachycardia that was likely an ectopic atrial tachycardia.  I reviewed this with Dr. Graciela Husbands.  She has normal LV function.  She had no ventricular arrhythmias with exercise during her Myoview.  This is all likely paroxysmal atrial fibrillation or some other type of atrial tachycardia.  I discussed with Dr. Graciela Husbands the possibility of adjusting her flecainide.  However, we will continue her current dose for now.  I will have her follow up with Dr. Graciela Husbands in 2 months.  She knows to return sooner if she has a change in her symptoms.

## 2010-11-01 ENCOUNTER — Telehealth: Payer: Self-pay | Admitting: *Deleted

## 2010-11-01 NOTE — Telephone Encounter (Signed)
PT AWARE OF MYOVIEW RESULTS./CY 

## 2010-11-01 NOTE — Telephone Encounter (Signed)
LMTCB RE NORMAL MYOVIEW .Zack Seal

## 2010-11-01 NOTE — Telephone Encounter (Signed)
Pt rtn your call and you can reach her at [phone]

## 2010-11-02 ENCOUNTER — Telehealth: Payer: Self-pay | Admitting: *Deleted

## 2010-11-02 NOTE — Telephone Encounter (Signed)
PT AWARE OF MYOVIEW RESULTS./CY 

## 2010-11-09 NOTE — Assessment & Plan Note (Signed)
[facility]                         ELECTROPHYSIOLOGY OFFICE NOTE   DONTA, MCINROY                      MRN:          [id number]  DATE:03/12/2008                            DOB:          [birth year]    Ms. Creekmore is seen in followup for atrial fibrillation.  She has  complaints of episodes where she becomes abruptly presyncopal.  These  last only seconds.   She also has episodes where she feels her heart fluttering.  These  former episodes occur every week or two and the others occur almost  daily.  The more concerning spells have occurred while sitting.   Her medications include flecainide 50 b.i.d., Synthroid 112 mcg a day,  she is not on a rate-controlling drug for reasons that are not entirely  clear at this point.   Her blood pressure was 98/60, her pulse was 72.  Her lungs were clear.  Her heart sounds were regular.  Extremities were without edema.   Electrocardiogram demonstrated sinus rhythm at 70 with intervals of  0.16/0.08/0.3.  The axis was leftward of -24.   IMPRESSION:  1. Paroxysmal atrial fibrillation.  2. Recurrent presyncope.  3. Flutters, which I presume were atrial fibrillation.   I am concerned about these episodes of presyncope.  I wonder whether  they are post-termination pauses or vasomotor response to atrial  fibrillation.   We will plan to get event recorders done in about 3 weeks to review it.     Duke Salvia, MD, [facility]  Electronically Signed    SCK/MedQ  DD: 03/12/2008  DT: 03/13/2008  Job #: [id number]   cc:   Reuben Likes, M.D.

## 2010-11-09 NOTE — Assessment & Plan Note (Signed)
[facility]                         ELECTROPHYSIOLOGY OFFICE NOTE   LAASYA, PEYTON                      MRN:          [id number]  DATE:04/10/2008                            DOB:          April 10, 1941    Ms. Hornbaker is seen following an event recorder placed for episodes of  presyncope.  These occurred standing and sitting.  She has a history of  atrial fibrillation.  Unfortunately, we have incomplete data with a most  recent recording been only of through the first week and I have not seen  anything subsequent.  However, she did have 1 of her serious episodes on  March 21, 2008, which was in the record that we had on that day, the  one thing we saw was a single PAC.   Her medications are unchanged.  Her blood pressure today was somewhat  better 111/67, the pulse is 75.  The lungs were clear.  Heart sounds  were regular.  Extremities were without edema.   IMPRESSION:  1. Paroxysmal atrial fibrillation.  2. Recurrent presyncope.  3. Flutters, with all that we could record was PACs.   I am not quite sure what is the explanation for Mrs. Bolds's spells,  We will plan to see her again in 3-4 months.  She is to let us know if  they are more serious.  I told her that I think implantable recorder or  a 30-day event monitor might be helpful in the event that we have a  problem.     Duke Salvia, MD, [facility]  Electronically Signed    SCK/MedQ  DD: 04/10/2008  DT: 04/11/2008  Job #: [id number]

## 2010-11-09 NOTE — Letter (Signed)
Nov 15, 2006    Reuben Likes, M.D.  [address]  [place], [place] [address]   RE:  [name], [name]  MRN:  [id number]  /  DOB:  01-19-1941   Dear Onalee Hua:   It was a pleasure to get your tracing on Ms. Renfro the other day.  I am  always impressed at your astuteness.   I was also very concerned about the electrocardiogram and bring her in  today.  She has had some increasing problems with shortness of breath  but no significant chest discomfort.   We reviewed her previous Myoview scan from 2 years ago that demonstrated  one, both normal left ventricular function; two, normal blood pressure  response to exercise; and three, no evidence of ischemia, however, not  withstanding that the electrocardiographic abnormalities were quite  striking.   She is also complaining of more problems with palpitations.  These were  quite brief, lasting mostly seconds, occasionally up to a minute.  They  were quite different from the atrial fibrillation that was sustained,  that was identified in 2002 of which we have an electrocardiogram.  She  is not sure whether her heart started going faster or slower with these  palpitations.   MEDICATIONS:  1. Flecainide, she is still taking it once a day.  2. Folic acid.  3. Nexium.  4. Synthroid.   EXAMINATION:  Her blood pressure is 102/70, her pulse was 76.  LUNGS:  Clear.  HEART:  Sounds were regular.  EXTREMITIES:  Without edema.   Electrocardiogram dated today demonstrated sinus rhythm at 76 with  interval 0.15/0.08/0.38, the axis was mildly leftward.  There is minor  ST segment flattening and biphasic Q waves in V2 and V3.  The  electrocardiogram from your office last week demonstrated ST segment  depression in leads V1, V2, V3, V4, and V5 with T wave inversions in the  anterior precordium.  There is mild ST segment depression in the  inferior leads.   IMPRESSION:  1. Atrial fibrillation and palpitations, currently on flecainide.  2.  New onset ST segment changes in the anterior precordium that are      quite dynamic in the setting of a previously negative Myoview scan      2 years ago.  3. Flecainide therapy for #1 being taken daily.   Theodoro Grist, I am concerned about the abnormalities on the EKG and I suggest  that we undertake repeat Myoview scanning.  I would have a low threshold  to pursue catheterization if there were any abnormalities evident.   This is important both for prognosis also related to her antiarrhythmic  drug therapy as flecainide is contraindicated in the presence of  coronary artery disease.   Thank you very much for asking [name] to see her and I will touch base with  you about this over the next couple of days.    Sincerely,      Duke Salvia, MD, [facility]  Electronically Signed    SCK/MedQ  DD: 11/15/2006  DT: 11/15/2006  Job #: [id number]

## 2010-11-12 NOTE — Op Note (Signed)
NAME:  [name], [name]             ACCOUNT NO.:  [id number]   MEDICAL RECORD NO.:  [id number]          PATIENT TYPE:  AMB   LOCATION:  SDS                          FACILITY:  [facility]   PHYSICIAN:  Balinda Quails, M.D.    DATE OF BIRTH:  Jan 30, 1941   DATE OF PROCEDURE:  03/17/2005  DATE OF DISCHARGE:  03/17/2005                                 OPERATIVE REPORT   SURGEON:  Denman George, M.D.   ASSISTANT:  Nurse.   ANESTHETIC:  General endotracheal.   ANESTHESIOLOGIST:  Maren Beach, M.D.   PREOPERATIVE DIAGNOSIS:  Varicose veins, left lower extremity.   POSTOPERATIVE DIAGNOSIS:  Varicose veins, left lower extremity.   PROCEDURE:  1.  Stripping of left greater saphenous vein.  2.  Stripping and ligation of multiple venous tributaries, left lower      extremity.   CLINICAL NOTE:  [name] is a 70 year old female who has previously  undergone intravenous laser therapy for left greater saphenous incompetence  associated with symptomatic varicosities.  She has aching discomfort and  swelling.  This was unrelieved by conservative measures including  analgesics, leg elevation and support hose.   Due to previous failure of her endovenous approach, she is brought to the  operating at this time for an open stripping of her left greater saphenous  vein along with ligation and stripping of tributaries.   PROCEDURE NOTE:  The patient was brought to the operating room in stable  condition.  General endotracheal anesthesia induced.  Left leg prepped and  draped in sterile fashion.   Oblique skin incision was made through the left groin at the saphenofemoral  junction.  Dissection carried down to expose a very large incompetence  greater saphenous vein.  The tributaries of the saphenous vein in the groin  were ligated with 3-0 silk and divided.  The vein was encircled with a 2-0  silk tie.   A second skin incision was then made through a scar in the in the left calf.  Dissection carried down through subcutaneous tissue.  A large tuft of  superficial varicosities was present.  These were teased from the  subcutaneous tissue, ligated with 3-0 silk and the stripped.  The greater  saphenous vein was identified.  This was ligated distally with 2-0 silk.  The vein was controlled proximally and a long plastic stripper passed up the  vein to the saphenofemoral junction.  The greater saphenous vein was ligated  to the saphenofemoral junction with 2-0 silk.  Using a stripper device, the  greater saphenous vein was then stripped from the proximal calf to the  groin.   There was a large ropy varicosity along the lateral malleolus which was  causing the patient discomfort.  his was all stripped through stepladder  incisions throughout its length.  Ligated both proximally and distally with  3-0 silk.   The wound was then irrigated with saline solution.  The left groin incision  was closed with running 3-0 Vicryl suture in a subcutaneous layer and 4-0  Monocryl for skin.  The medial calf incision was closed with interrupted  3-0  Vicryl suture for the subcutaneous layer and 4-0 Monocryl for the skin.   The left lateral malleolar incisions were closed with single layer of  interrupted 4-0 dermal Vicryl.  Steri-Strips applied.   The left leg was then dressed with 4 x 4's, Kerlix and Ace wrap from the  ankle to the proximal thigh.   There were no apparent complications.  The patient tolerated the procedure  well.  Transferred to the recovery room in stable condition.      Balinda Quails, M.D.  Electronically Signed     PGH/MEDQ  D:  03/17/2005  T:  03/18/2005  Job:  [id number]

## 2010-11-12 NOTE — Op Note (Signed)
NAME:  [name], [name]             ACCOUNT NO.:  [id number]   MEDICAL RECORD NO.:  [id number]          PATIENT TYPE:  AMB   LOCATION:  DSC                          FACILITY:  [facility]   PHYSICIAN:  Christopher E. Ezzard Standing, M.D.DATE OF BIRTH:  04/18/1941   DATE OF PROCEDURE:  11/02/2004  DATE OF DISCHARGE:                                 OPERATIVE REPORT   PREOPERATIVE DIAGNOSIS:  Palate lesion.   POSTOPERATIVE DIAGNOSIS:  Palate lesion.   PROCEDURE:  Excision of hard palate lesion (1 cm).   SURGEON:  Kristine Garbe. Ezzard Standing, M.D.   ANESTHESIA:  General endotracheal.   COMPLICATIONS:  None.   BRIEF CLINICAL NOTE:  [name] is a 70 year old female who has had a  small nodule on the palate area that has been intermittently bleeding for  the past several months.  She has noted the small lesion for about a year  now.  On examination, she has a slightly raised nodule on the hard palate  just right of the midline, slightly raised and discolored consistent with  probable hemangioma.  She is taken to the operating room at this time for  excision of palatal lesion.   DESCRIPTION OF PROCEDURE:  After adequate endotracheal anesthesia, the  palate was injected with 1 mL of Xylocaine with epinephrine for hemostasis  and local anesthetic.  The lesion was excised and sent to pathology.  She  had some slight elevation or nodule of the bony palate most likely reactive.  As we were waiting for frozen section on the soft tissue lesion, the bony  curet was used to remove the nodular elevation of the hard palate.  Final  pathology report on the palatal lesion was consistent with hemangioma.  Hemostasis was obtained with a cautery.  This completed the procedure.  [name] awoke from anesthesia and was transferred to recovery room  postoperatively doing well.   DISPOSITION:  [name] is discharged home later this morning on Tylenol No.3  p.r.n. pain and will continue with her regular medications  and follow up in  my office in 10 days for recheck.      CEN/MEDQ  D:  11/02/2004  T:  11/02/2004  Job:  [id number]   cc:   Reuben Likes, M.D.  [address]  [place]  [place] [address]  Fax: [phone]

## 2010-11-12 NOTE — Op Note (Signed)
NAME:  [name], [name]                         ACCOUNT NO.:  [id number]   MEDICAL RECORD NO.:  [id number]                   PATIENT TYPE:  OIB   LOCATION:  2899                                 FACILITY:  [facility]   PHYSICIAN:  Theresia Majors. Tanda Rockers, M.D.             DATE OF BIRTH:  07/13/1940   DATE OF PROCEDURE:  07/09/2003  DATE OF DISCHARGE:                                 OPERATIVE REPORT   PREOPERATIVE DIAGNOSIS:  Ulcerated lesion, left lower extremity,  questionable thrombosed and ulcerated varix, versus neoplasia.   POSTOPERATIVE DIAGNOSIS:  Ulcerated lesion, left lower extremity  questionable thrombosed and ulcerated varix, versus neoplasia.   OPERATION:  Phlebectomy with excisional biopsy.   SURGEON:  Theresia Majors. Tanda Rockers, M.D.   INDICATIONS FOR PROCEDURE:  The patient is a 71 year old lady referred by  Dr. Karie Soda. Woods for evaluation of a non-healing ulceration on the medial  aspect of the left lower extremity.  The non-invasive studies suggested a  plethora of varicosities underlying the inflammatory lesion.  We recommended  that the patient go for an excisional biopsy and a phlebectomy.   DESCRIPTION OF PROCEDURE:  The patient was taken to the day surgery suite  and placed on the table in the supine position.  The anesthetic used were 1%  Xylocaine.  The areas were prepped with Betadine soap and Betadine solution  and sterile drapes were applied.  The initial elliptical incision completely  excised the inflammatory lesion, and it was noted that underneath there were  areas of varicose veins which were in addition excised.  Several areas of  vein were extracted with hemorrhage controlled with cautery.  The incisions  were closed with interrupted subcuticular sutures of #5-0 nylon.  Sterile  compressive dressings were applied, reinforced with a six inch Ace bandage.  The patient tolerated the procedure well and was transferred to the holding  area in satisfactory condition, with  stable vital signs.  Specimens will be forwarded to pathology for final disposition.                                               Harold A. Tanda Rockers, M.D.    Cephus Slater  D:  07/09/2003  T:  07/09/2003  Job:  [id number]   cc:   Reuben Likes, M.D.  [address]  [place]  [place] [address]  Fax: [phone]

## 2010-11-30 ENCOUNTER — Telehealth: Payer: Self-pay | Admitting: *Deleted

## 2010-11-30 NOTE — Telephone Encounter (Signed)
The patient is aware of her event monitor results.

## 2010-12-07 ENCOUNTER — Encounter (HOSPITAL_BASED_OUTPATIENT_CLINIC_OR_DEPARTMENT_OTHER): Payer: Medicare Other | Admitting: Oncology

## 2010-12-07 ENCOUNTER — Other Ambulatory Visit: Payer: Self-pay | Admitting: Oncology

## 2010-12-07 LAB — CBC WITH DIFFERENTIAL/PLATELET
BASO%: 1 % (ref 0.0–2.0)
LYMPH%: 26.9 % (ref 14.0–49.7)
MCHC: 34.5 g/dL (ref 31.5–36.0)
MONO#: 0.5 10*3/uL (ref 0.1–0.9)
RBC: 3.64 10*6/uL — ABNORMAL LOW (ref 3.70–5.45)
RDW: 12.8 % (ref 11.2–14.5)
WBC: 5.8 10*3/uL (ref 3.9–10.3)
lymph#: 1.5 10*3/uL (ref 0.9–3.3)

## 2010-12-07 LAB — COMPREHENSIVE METABOLIC PANEL
ALT: 22 U/L (ref 0–35)
CO2: 25 mEq/L (ref 19–32)
Chloride: 102 mEq/L (ref 96–112)
Sodium: 138 mEq/L (ref 135–145)
Total Bilirubin: 0.6 mg/dL (ref 0.3–1.2)
Total Protein: 6.6 g/dL (ref 6.0–8.3)

## 2010-12-07 LAB — LACTATE DEHYDROGENASE: LDH: 193 U/L (ref 94–250)

## 2010-12-07 LAB — IRON AND TIBC: %SAT: 49 % (ref 20–55)

## 2010-12-31 ENCOUNTER — Encounter (HOSPITAL_BASED_OUTPATIENT_CLINIC_OR_DEPARTMENT_OTHER): Payer: Medicare Other | Admitting: Oncology

## 2011-01-13 ENCOUNTER — Encounter: Payer: Self-pay | Admitting: Internal Medicine

## 2011-01-17 ENCOUNTER — Ambulatory Visit (INDEPENDENT_AMBULATORY_CARE_PROVIDER_SITE_OTHER): Payer: Medicare Other | Admitting: Internal Medicine

## 2011-01-17 ENCOUNTER — Encounter: Payer: Self-pay | Admitting: Internal Medicine

## 2011-01-17 DIAGNOSIS — R002 Palpitations: Secondary | ICD-10-CM

## 2011-01-17 DIAGNOSIS — I4891 Unspecified atrial fibrillation: Secondary | ICD-10-CM

## 2011-01-17 NOTE — Assessment & Plan Note (Signed)
No symptomatic atrial fibrillation. 30 day monitor demonstrated none. We will continue her flecainide

## 2011-01-17 NOTE — Progress Notes (Signed)
  HPI  [name] is a 70 y.o. female seen in followup for atrial fibrillation Occurring in the context of hemochromatosis This is the largely quiescent on flecainide therapy.  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  She is now undergoing phlebotomy monthly  She is having palpitations. In the fall we tried an event recorder it did not work for a well. Tereso Newcomer has tried another one. She is here today to review it.  She is having dyspnea and underwent an echo And a stress test. I could find a former. I could not find the latter. I do see note however from Tereso Newcomer that both were read as normal. She does have mild left atrial enlargement   Past Medical History  Diagnosis Date  . Atrial fibrillation     a. Flecainide therapy;  b. event monitor 4/12  . Hemochromatosis     indentified by the C282Y gene mutation; Dr. Cyndie Chime  . History of syncope   . Hypothyroidism   . Depression with anxiety   . GERD (gastroesophageal reflux disease)   . Chest pain     a. GXT myoview 4/12: no isch., EF 86%;   b. echo 4/12: EF 55-65%, grade 1 diast dysfxn, LAE    Past Surgical History  Procedure Date  . Total abdominal hysterectomy   . Tonsillectomy     Current Outpatient Prescriptions  Medication Sig Dispense Refill  . aspirin 81 MG EC tablet Take 81 mg by mouth daily.        . Calcium Carbonate 1500 MG TABS Take 1 tablet by mouth daily.        . Cholecalciferol (VITAMIN D) 1000 UNITS capsule Take 1,000 Units by mouth daily.        Marland Kitchen esomeprazole (NEXIUM) 40 MG capsule Take 40 mg by mouth daily before breakfast.        . flecainide (TAMBOCOR) 100 MG tablet Take 100 mg by mouth. 1/2 tablet in the morning and 1/2 tablet in the evening        . folic acid (FOLVITE) 1 MG tablet Take 1 mg by  mouth daily.        Marland Kitchen levothyroxine (SYNTHROID, LEVOTHROID) 112 MCG tablet Take 112 mcg by mouth daily.        Marland Kitchen VITAMIN B1-B12 IM Inject 1,000 mcg into the muscle every 30 (thirty) days.          No Known Allergies  Review of Systems negative except from HPI and PMH  Physical Exam Well developed and well nourished in no acute distress HENT normal E scleral and icterus clear Neck Supple JVP flat; carotids brisk and full Clear to ausculation Regular rate and rhythm, no murmurs gallops or rub Soft with active bowel sounds No clubbing cyanosis and edema Alert and oriented, grossly normal motor and sensory function Skin Warm and Dry    Assessment and  Plan

## 2011-01-17 NOTE — Patient Instructions (Signed)
Your physician wants you to follow-up in: 6 months  You will receive a reminder letter in the mail two months in advance. If you don't receive a letter, please call our office to schedule the follow-up appointment.  Your physician recommends that you continue on your current medications as directed. Please refer to the Current Medication list given to you today.  

## 2011-01-18 ENCOUNTER — Other Ambulatory Visit: Payer: Self-pay | Admitting: Oncology

## 2011-01-18 ENCOUNTER — Ambulatory Visit (HOSPITAL_COMMUNITY): Payer: Medicare Other | Attending: Oncology

## 2011-01-18 LAB — FERRITIN: Ferritin: 19 ng/mL (ref 10–291)

## 2011-04-06 ENCOUNTER — Other Ambulatory Visit: Payer: Self-pay | Admitting: Dermatology

## 2011-04-08 ENCOUNTER — Ambulatory Visit
Admission: RE | Admit: 2011-04-08 | Discharge: 2011-04-08 | Disposition: A | Discharge status: Home / Self Care | Payer: Medicare Other | Source: Ambulatory Visit | Attending: Emergency Medicine | Admitting: Emergency Medicine

## 2011-04-19 ENCOUNTER — Encounter (HOSPITAL_COMMUNITY): Payer: Medicare Other

## 2011-04-21 ENCOUNTER — Other Ambulatory Visit: Payer: Self-pay | Admitting: Oncology

## 2011-04-21 ENCOUNTER — Encounter (HOSPITAL_COMMUNITY): Payer: Medicare Other

## 2011-04-21 ENCOUNTER — Ambulatory Visit (HOSPITAL_COMMUNITY)
Admission: RE | Admit: 2011-04-21 | Discharge: 2011-04-21 | Disposition: A | Discharge status: Home / Self Care | Payer: Medicare Other | Source: Ambulatory Visit | Attending: Oncology | Admitting: Oncology

## 2011-04-21 LAB — HEMOGLOBIN AND HEMATOCRIT, BLOOD
HCT: 37.5 % (ref 36.0–46.0)
Hemoglobin: 12.4 g/dL (ref 12.0–15.0)

## 2011-04-21 LAB — FERRITIN: Ferritin: 10 ng/mL (ref 10–291)

## 2011-05-21 ENCOUNTER — Telehealth: Payer: Self-pay | Admitting: Oncology

## 2011-05-21 NOTE — Telephone Encounter (Signed)
Talked to pt's husband , left message ,regarding appt that was cancelled in December and r/s to February

## 2011-05-27 ENCOUNTER — Telehealth: Payer: Self-pay

## 2011-05-27 NOTE — Telephone Encounter (Signed)
Received VM from pt wishing to r/s a lab appt next month.  Message forwarded to [facility], scheduler. dph

## 2011-06-07 ENCOUNTER — Other Ambulatory Visit: Payer: Medicare Other | Admitting: Lab

## 2011-07-01 ENCOUNTER — Telehealth: Payer: Self-pay | Admitting: Oncology

## 2011-07-01 ENCOUNTER — Other Ambulatory Visit: Payer: Medicare Other | Admitting: Lab

## 2011-07-01 NOTE — Telephone Encounter (Signed)
Talked to pt, she is aware of alab on 08/03/11 and 08/12/11 MD visit

## 2011-07-19 ENCOUNTER — Other Ambulatory Visit (HOSPITAL_COMMUNITY): Payer: Self-pay

## 2011-07-19 ENCOUNTER — Other Ambulatory Visit: Payer: Self-pay

## 2011-07-19 ENCOUNTER — Other Ambulatory Visit: Payer: Self-pay | Admitting: Oncology

## 2011-07-19 ENCOUNTER — Telehealth: Payer: Self-pay

## 2011-07-19 NOTE — Telephone Encounter (Signed)
Received call from Tammy at [facility] short stay requesting orders for pt for labs (previously H&H & Ferritin) and phlebotomy for Hgb > 12.  Pt scheduled for tomorrow 1/23.  Per Tammy, Dr Cyndie Chime to "sign & hold" orders, so they can be released in their dept.    Note to Dr Cyndie Chime. dph

## 2011-07-21 ENCOUNTER — Encounter (HOSPITAL_COMMUNITY): Admission: RE | Admit: 2011-07-21 | Payer: Medicare Other | Source: Ambulatory Visit

## 2011-07-21 ENCOUNTER — Encounter (HOSPITAL_COMMUNITY): Payer: Medicare Other

## 2011-07-26 ENCOUNTER — Other Ambulatory Visit (HOSPITAL_COMMUNITY): Payer: Self-pay | Admitting: *Deleted

## 2011-07-27 ENCOUNTER — Encounter (HOSPITAL_COMMUNITY): Payer: Self-pay

## 2011-07-27 ENCOUNTER — Encounter (HOSPITAL_COMMUNITY)
Admission: RE | Admit: 2011-07-27 | Discharge: 2011-07-27 | Disposition: A | Discharge status: Home / Self Care | Payer: Medicare Other | Source: Ambulatory Visit | Attending: Oncology | Admitting: Oncology

## 2011-07-27 LAB — FERRITIN: Ferritin: 12 ng/mL (ref 10–291)

## 2011-07-27 LAB — HEMOGLOBIN AND HEMATOCRIT, BLOOD
HCT: 32.1 % — ABNORMAL LOW (ref 36.0–46.0)
Hemoglobin: 10.6 g/dL — ABNORMAL LOW (ref 12.0–15.0)

## 2011-07-28 ENCOUNTER — Telehealth: Payer: Self-pay | Admitting: *Deleted

## 2011-07-28 NOTE — Telephone Encounter (Signed)
Message copied by Sabino Snipes on Thu Jul 28, 2011 10:22 AM ------      Message from: Levert Feinstein      Created: Thu Jul 28, 2011  9:27 AM       Call pt:  Ferritin 12 and hemoglobin down - we can back off on phlebotomy - check lab in 3 months and  Re-eval

## 2011-07-28 NOTE — Telephone Encounter (Signed)
Pt notified of Dr.Granfortuna's message & reports that she has an appt in April.

## 2011-08-03 ENCOUNTER — Other Ambulatory Visit: Payer: Medicare Other | Admitting: Lab

## 2011-08-12 ENCOUNTER — Ambulatory Visit: Payer: Medicare Other | Admitting: Oncology

## 2011-10-07 ENCOUNTER — Other Ambulatory Visit (HOSPITAL_BASED_OUTPATIENT_CLINIC_OR_DEPARTMENT_OTHER): Payer: Medicare Other | Admitting: Lab

## 2011-10-07 LAB — CBC WITH DIFFERENTIAL/PLATELET
BASO%: 1.6 % (ref 0.0–2.0)
Basophils Absolute: 0.1 10*3/uL (ref 0.0–0.1)
EOS%: 4.1 % (ref 0.0–7.0)
Eosinophils Absolute: 0.2 10*3/uL (ref 0.0–0.5)
HCT: 33 % — ABNORMAL LOW (ref 34.8–46.6)
HGB: 10.8 g/dL — ABNORMAL LOW (ref 11.6–15.9)
LYMPH%: 21.7 % (ref 14.0–49.7)
MCH: 31.3 pg (ref 25.1–34.0)
MCHC: 32.9 g/dL (ref 31.5–36.0)
MCV: 95.2 fL (ref 79.5–101.0)
MONO#: 0.6 10*3/uL (ref 0.1–0.9)
MONO%: 9.6 % (ref 0.0–14.0)
NEUT#: 3.7 10*3/uL (ref 1.5–6.5)
NEUT%: 63 % (ref 38.4–76.8)
Platelets: 251 10*3/uL (ref 145–400)
RBC: 3.46 10*6/uL — ABNORMAL LOW (ref 3.70–5.45)
RDW: 15.9 % — ABNORMAL HIGH (ref 11.2–14.5)
WBC: 5.9 10*3/uL (ref 3.9–10.3)
lymph#: 1.3 10*3/uL (ref 0.9–3.3)
nRBC: 0 % (ref 0–0)

## 2011-10-10 ENCOUNTER — Telehealth: Payer: Self-pay | Admitting: Oncology

## 2011-10-10 ENCOUNTER — Ambulatory Visit (HOSPITAL_BASED_OUTPATIENT_CLINIC_OR_DEPARTMENT_OTHER): Payer: Medicare Other | Admitting: Oncology

## 2011-10-10 NOTE — Progress Notes (Signed)
Hematology and Oncology Follow Up Visit  [name] [id number] 1941-02-10 71 y.o. 10/10/2011 5:05 PM   Principle Diagnosis: Encounter Diagnosis  Name Primary?  . 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 Her brother and 2 of her daughters are affected as well. Her brother required a liver transplant. She has done well with her disease and never required phlebotomy until December of 2010 since her serum ferritin levels always ran low and she had normal liver functions. Ferritin recorded in December 2010 was 248. She was started on a monthly phlebotomy program. She had a rapid response. Ferritin was down to 58 by August 2011. Interval between phlebotomies decreased to every 3 months. Most recently at time of phlebotomy in October 2012 ferritin was down to 10. Repeat value at time of most recent phlebotomy at the end of January of this year was 17. Hemoglobin has now started to fall from her baseline of 12.5 g down to a current value of 10.8 g.  She is asymptomatic at this time. She's had no interim medical problems. She denies any joint pains. No paresthesias. Liver functions remain normal.  Medications: reviewed  Allergies: No Known Allergies  Review of Systems: Constitutional:   No constitutional symptoms Respiratory: no cough or dyspnea Cardiovascular:  No chest pain or palpitations  Gastrointestinal: no change in bowel habit Genito-Urinary:  Not questioned Musculoskeletal: no muscle or bone pain , no joint pain Neurologic: no headache or change in vision Skin: no rash Remaining ROS negative.  Physical Exam: Blood pressure 123/52, pulse 96, temperature 96.8 F (36 C), temperature source Oral, height 5\' 4"  (1.626 m), weight 144 lb 4.8 oz (65.454 kg). Wt Readings from Last 3 Encounters:  10/10/11 144 lb 4.8 oz (65.454 kg)  01/17/11 144 lb 12.8 oz (65.681 kg)  10/26/10 140 lb (63.504  kg)     General appearance:  Thin Caucasian woman HENNT:  Pharynx no erythema or exudate Lymph nodes:  No adenopathy Breasts: not examined Lungs: clear to auscultation resonant to percussion Heart: regular rhythm no murmur Abdomen: soft nontender no mass no organomegaly Extremities: no edema no calf tenderness Vascular: no cyanosis Neurologic: motor strength 5 over 5 reflexes 1+ symmetric Skin: no rash or ecchymosis  Lab Results: Lab Results  Component Value Date   WBC 5.9 10/07/2011   HGB 10.8* 10/07/2011   HCT 33.0* 10/07/2011   MCV 95.2 10/07/2011   PLT 251 10/07/2011     Chemistry      Component Value Date/Time   NA 138 12/07/2010 1341   K 4.1 12/07/2010 1341   CL 102 12/07/2010 1341   CO2 25 12/07/2010 1341   BUN 11 12/07/2010 1341   CREATININE 0.67 12/07/2010 1341      Component Value Date/Time   CALCIUM 9.4 12/07/2010 1341   ALKPHOS 59 12/07/2010 1341   AST 19 12/07/2010 1341   ALT 22 12/07/2010 1341   BILITOT 0.6 12/07/2010 1341       Impression and Plan: #1. Homozygote for the C282Y hemachromatosis gene. She is now iron deplete from repetitive phlebotomies to the extent that she is now anemic. I'm going to stop the regular phlebotomy program. Repeat a CBC and iron studies on May 20. Make a decision at that point on the optimal interval for her next phlebotomy.  #2. History of atrial fibrillation  #3. Hypothyroid on replacement   CC:.  Dr. Leslee Home; Dr. Sherryl Manges;  Dr. Sharrell Ku; Dr. Varney Baas   Levert Feinstein, MD 4/15/20135:05 PM

## 2011-10-10 NOTE — Telephone Encounter (Signed)
Per pt she states that we are to cx her phleb for 4/24 at med day.  L/M  For Donica/dr g nurse to take care of cx appt.   Appts made and printed for pt aom

## 2011-10-18 ENCOUNTER — Telehealth: Payer: Self-pay

## 2011-10-18 NOTE — Telephone Encounter (Signed)
Received call from Clydie Braun at [facility] Stay reporting pt is scheduled to come in tomorrow & requesting orders be entered into EP[facility].   Note to Dr Cyndie Chime. dph

## 2011-10-18 NOTE — Telephone Encounter (Signed)
Pt notified by phone that appt for phlebotomy tomorrow has been cancelled as was discussed at pt's MD appt.  Pt agrees.  Misty Stanley, RN in short stay aware. dph

## 2011-10-19 ENCOUNTER — Encounter (HOSPITAL_COMMUNITY): Admission: RE | Admit: 2011-10-19 | Payer: Medicare Other | Source: Ambulatory Visit

## 2011-11-14 ENCOUNTER — Other Ambulatory Visit: Payer: Medicare Other

## 2011-11-15 ENCOUNTER — Telehealth: Payer: Self-pay | Admitting: Oncology

## 2011-11-15 ENCOUNTER — Other Ambulatory Visit: Payer: Medicare Other

## 2011-11-15 NOTE — Telephone Encounter (Signed)
Pt called cancelling lab appt for today, she will call to r/s her appt, Called nurse to Dr.Granfortuna and left message regarding lab being cancelled by pt

## 2011-11-24 ENCOUNTER — Other Ambulatory Visit (HOSPITAL_BASED_OUTPATIENT_CLINIC_OR_DEPARTMENT_OTHER): Payer: Medicare Other | Admitting: Lab

## 2011-11-24 LAB — CBC WITH DIFFERENTIAL/PLATELET
BASO%: 1.4 % (ref 0.0–2.0)
LYMPH%: 25.1 % (ref 14.0–49.7)
MCHC: 33.2 g/dL (ref 31.5–36.0)
MCV: 94.9 fL (ref 79.5–101.0)
MONO#: 0.4 10*3/uL (ref 0.1–0.9)
MONO%: 7 % (ref 0.0–14.0)
NEUT#: 3.1 10*3/uL (ref 1.5–6.5)
Platelets: 236 10*3/uL (ref 145–400)
RBC: 3.71 10*6/uL (ref 3.70–5.45)
RDW: 15.7 % — ABNORMAL HIGH (ref 11.2–14.5)
WBC: 5 10*3/uL (ref 3.9–10.3)
nRBC: 0 % (ref 0–0)

## 2011-11-24 LAB — IRON AND TIBC: Iron: 154 ug/dL — ABNORMAL HIGH (ref 42–145)

## 2012-01-26 ENCOUNTER — Ambulatory Visit: Payer: Medicare Other | Admitting: Physician Assistant

## 2012-02-02 ENCOUNTER — Ambulatory Visit (INDEPENDENT_AMBULATORY_CARE_PROVIDER_SITE_OTHER): Payer: Medicare Other | Admitting: Physician Assistant

## 2012-02-02 ENCOUNTER — Encounter: Payer: Self-pay | Admitting: Physician Assistant

## 2012-02-02 VITALS — BP 114/73 | HR 93 | Ht 64.0 in | Wt 143.0 lb

## 2012-02-02 DIAGNOSIS — I4891 Unspecified atrial fibrillation: Secondary | ICD-10-CM

## 2012-02-02 DIAGNOSIS — R42 Dizziness and giddiness: Secondary | ICD-10-CM

## 2012-02-02 LAB — BASIC METABOLIC PANEL
BUN: 13 mg/dL (ref 6–23)
Chloride: 103 mEq/L (ref 96–112)
Creatinine, Ser: 0.6 mg/dL (ref 0.4–1.2)
Glucose, Bld: 92 mg/dL (ref 70–99)
Potassium: 3.4 mEq/L — ABNORMAL LOW (ref 3.5–5.1)

## 2012-02-02 MED ORDER — GABAPENTIN 300 MG PO CAPS: 300.0000 mg | ORAL_CAPSULE | Freq: Every evening | ORAL | Status: DC | PRN

## 2012-02-02 MED ORDER — IBUPROFEN 800 MG PO TABS: 800.0000 mg | ORAL_TABLET | ORAL | Status: DC | PRN

## 2012-02-02 MED ORDER — METHOCARBAMOL 500 MG PO TABS: 500.0000 mg | ORAL_TABLET | Freq: Every evening | ORAL | Status: AC | PRN

## 2012-02-02 MED ORDER — METOPROLOL SUCCINATE ER 25 MG PO TB24: 12.5000 mg | ORAL_TABLET | Freq: Every day | ORAL | Status: DC

## 2012-02-02 NOTE — Patient Instructions (Addendum)
Your physician has recommended you make the following change in your medication: START TOPROLOL XL 25 MG TABLET; TAKE 1/2 (HALF) TABLET TO = 12.5 MG DAILY.  Your physician recommends that you return for lab work in: [facility], [facility]  Your physician recommends that you schedule a follow-up appointment in: 3-4 MONTHS WITH DR. Graciela Husbands

## 2012-02-02 NOTE — Progress Notes (Signed)
[address] [place], [place]  [address] Phone: [phone] Fax:  [phone]  Date:  02/02/2012   Name:  [name]   DOB:  May 12, 1941   MRN:  [id number]  PCP:  Teena Irani, PA  Primary Cardiologist/Primary Electrophysiologist:  Dr. Sherryl Manges    History of Present Illness: [name] is a 71 y.o. female who returns for evaluation of dizziness.   She has a h/o atrial fibrillation largely controlled by Flecainide and hereditary hemochromatosis. Myoview 10/14/10: no ischemia and an EF of 86%. Echocardiogram: EF of 55-60%, Grade 1 diastolic dysfunction and LAE. Event monitor 5/12: mainly NSR with PACs. She did have 2 brief episodes of WCT. I reviewed this with Dr. Graciela Husbands. No AFib felt to be evident.  Last seen by Dr. Sherryl Manges in 7/12.    She had 2 episodes of dizziness 2 weeks ago that was short lived.  May have lasted 2-3 mins.  EKG at her PCP office was "ok.".  She denies chest pain or dyspnea.  She did have jaw and shoulder discomfort with this episode like she had with her first episode of AFib (not as bad this time).  No orthopnea, PND, edema.  She has not had a recurrence.    Wt Readings from Last 3 Encounters:  02/02/12 143 lb (64.864 kg)  10/10/11 144 lb 4.8 oz (65.454 kg)  01/17/11 144 lb 12.8 oz (65.681 kg)     Past Medical History  Diagnosis Date  . Atrial fibrillation     a. Flecainide therapy;  b. event monitor 4/12  . Hemochromatosis     indentified by the C282Y gene mutation; Dr. Cyndie Chime  . History of syncope   . Hypothyroidism   . Depression with anxiety   . GERD (gastroesophageal reflux disease)   . Chest pain     a. GXT myoview 4/12: no isch., EF 86%;   b. echo 4/12: EF 55-65%, grade 1 diast dysfxn, LAE    Current Outpatient Prescriptions  Medication Sig Dispense Refill  . aspirin 81 MG EC tablet Take 81 mg by mouth daily.        . Calcium Carbonate 1500 MG TABS Take 1 tablet by mouth as needed.       .  Cholecalciferol (VITAMIN D) 1000 UNITS capsule Take 1,000 Units by mouth as needed.       Marland Kitchen esomeprazole (NEXIUM) 40 MG capsule Take 40 mg by mouth daily before breakfast.        . flecainide (TAMBOCOR) 100 MG tablet Take 100 mg by mouth. 1/2 tablet in the morning and 1/2 tablet in the evening        . folic acid (FOLVITE) 1 MG tablet Take 1 mg by mouth daily.        Marland Kitchen levothyroxine (SYNTHROID, LEVOTHROID) 112 MCG tablet Take 112 mcg by mouth daily.        Marland Kitchen VITAMIN B1-B12 IM Inject 1,000 mcg into the muscle every 30 (thirty) days.          Allergies: No Known Allergies  History  Substance Use Topics  . Smoking status: Former Smoker    Quit date: 06/27/1997  . Smokeless tobacco: Never Used  . Alcohol Use: 0.5 oz/week    1 drink(s) per week     2 DRINKS DAILY     ROS:  Please see the history of present illness.    All other systems reviewed and negative.   PHYSICAL EXAM: VS:  BP 114/73  Pulse 93  Ht 5\' 4"  (1.626 m)  Wt 143 lb (64.864 kg)  BMI 24.55 kg/m2  Orthostatic VS: Lying  BP 103/68, HR 83 Sitting BP 103/68, HR 85 Stand BP 115/74, HR 96  Well nourished, well developed, in no acute distress HEENT: normal Neck: no JVD Vascular: no carotid bruits Cardiac:  normal S1, S2; RRR; no murmur Lungs:  clear to auscultation bilaterally, no wheezing, rhonchi or rales Abd: soft, nontender, no hepatomegaly Ext: no edema Skin: warm and dry Neuro:  CNs 2-12 intact, no focal abnormalities noted  EKG:  Sinus rhythm, heart rate 92, normal axis, nonspecific ST-T wave changes, PAC, no change from prior tracing      ASSESSMENT AND PLAN:  1. Dizziness She had symptoms that were somewhat reminiscent of her prior AFib.  This was very brief.  I notice she is not on an AV nodal blocking agent in the setting of Flecainide. In case she is having recurrent atrial arrhythmias I will put her on low dose Toprol XL 12.5 mg daily.  She will call me if she cannot tolerate it.  I will also check  BMET, TSH.  Follow up with Dr. Sherryl Manges in 3 mos or sooner prn.  2.  Atrial Fibrillation Maintaining NSR.  Continue Flecainide.  Start Toprol as noted and follow up in 3 mos.  Signed, Tereso Newcomer, PA-C  1:03 PM 02/02/2012

## 2012-02-03 ENCOUNTER — Telehealth: Payer: Self-pay | Admitting: *Deleted

## 2012-02-03 DIAGNOSIS — I4891 Unspecified atrial fibrillation: Secondary | ICD-10-CM

## 2012-02-03 NOTE — Telephone Encounter (Signed)
Message copied by Tarri Fuller on Fri Feb 03, 2012 10:33 AM ------      Message from: [place], [place] T      Created: Thu Feb 02, 2012  5:19 PM       K+ a little low.      Increase dietary K+.      Repeat bmet in 2 weeks.      Tereso Newcomer, PA-C  5:18 PM 02/02/2012

## 2012-02-03 NOTE — Telephone Encounter (Signed)
s/w pt's husband who is aware of lab results today and will get repeat bmet 8/21

## 2012-02-13 ENCOUNTER — Telehealth: Payer: Self-pay | Admitting: Physician Assistant

## 2012-02-13 NOTE — Telephone Encounter (Signed)
Patient is aware to come for repeat lab work on 02/15/12 to re-check potassium level.

## 2012-02-13 NOTE — Telephone Encounter (Signed)
Pt calling re blood work for 8-21, not sure why or what this is for, said she had blood work at last visit 02-02-12, pls call

## 2012-02-15 ENCOUNTER — Other Ambulatory Visit (INDEPENDENT_AMBULATORY_CARE_PROVIDER_SITE_OTHER): Payer: Medicare Other

## 2012-02-15 DIAGNOSIS — I4891 Unspecified atrial fibrillation: Secondary | ICD-10-CM

## 2012-02-15 LAB — BASIC METABOLIC PANEL
BUN: 11 mg/dL (ref 6–23)
CO2: 28 mEq/L (ref 19–32)
Chloride: 105 mEq/L (ref 96–112)
Creatinine, Ser: 0.7 mg/dL (ref 0.4–1.2)
Glucose, Bld: 85 mg/dL (ref 70–99)

## 2012-02-20 ENCOUNTER — Telehealth: Payer: Self-pay | Admitting: *Deleted

## 2012-02-20 NOTE — Telephone Encounter (Signed)
no answer, I will try again later  

## 2012-02-20 NOTE — Telephone Encounter (Signed)
lmom labs ok, no changes 

## 2012-02-20 NOTE — Telephone Encounter (Signed)
Message copied by Tarri Fuller on Mon Feb 20, 2012 11:24 AM ------      Message from: [place], [place] T      Created: Sun Feb 19, 2012  6:38 AM       K+ ok.      Continue with current treatment plan.      Tereso Newcomer, PA-C  6:38 AM 02/19/2012

## 2012-02-20 NOTE — Telephone Encounter (Signed)
Message copied by Tarri Fuller on Mon Feb 20, 2012  3:20 PM ------      Message from: [place], [place] T      Created: Sun Feb 19, 2012  6:38 AM       K+ ok.      Continue with current treatment plan.      Tereso Newcomer, PA-C  6:38 AM 02/19/2012

## 2012-03-06 ENCOUNTER — Other Ambulatory Visit: Payer: Self-pay | Admitting: Physician Assistant

## 2012-03-06 DIAGNOSIS — D1803 Hemangioma of intra-abdominal structures: Secondary | ICD-10-CM

## 2012-03-09 ENCOUNTER — Ambulatory Visit
Admission: RE | Admit: 2012-03-09 | Discharge: 2012-03-09 | Disposition: A | Discharge status: Home / Self Care | Payer: Medicare Other | Source: Ambulatory Visit | Attending: Physician Assistant | Admitting: Physician Assistant

## 2012-03-09 DIAGNOSIS — D1803 Hemangioma of intra-abdominal structures: Secondary | ICD-10-CM

## 2012-04-02 ENCOUNTER — Other Ambulatory Visit (HOSPITAL_BASED_OUTPATIENT_CLINIC_OR_DEPARTMENT_OTHER): Payer: Medicare Other | Admitting: Lab

## 2012-04-02 LAB — CBC WITH DIFFERENTIAL/PLATELET
BASO%: 0.5 % (ref 0.0–2.0)
EOS%: 0.9 % (ref 0.0–7.0)
Eosinophils Absolute: 0.1 10*3/uL (ref 0.0–0.5)
LYMPH%: 17.3 % (ref 14.0–49.7)
MCH: 31.6 pg (ref 25.1–34.0)
MCHC: 33.3 g/dL (ref 31.5–36.0)
MCV: 94.7 fL (ref 79.5–101.0)
MONO%: 10.6 % (ref 0.0–14.0)
Platelets: 296 10*3/uL (ref 145–400)
RBC: 3.8 10*6/uL (ref 3.70–5.45)
nRBC: 0 % (ref 0–0)

## 2012-04-03 ENCOUNTER — Telehealth: Payer: Self-pay | Admitting: *Deleted

## 2012-04-03 NOTE — Telephone Encounter (Signed)
Message copied by Orbie Hurst on Tue Apr 03, 2012 12:19 PM ------      Message from: Levert Feinstein      Created: Mon Apr 02, 2012  7:33 PM       Call pt  Ferritin remains low at 7  Can hold off on phlebotomies

## 2012-04-03 NOTE — Telephone Encounter (Signed)
Spoke with patient and let her know that ferritin remains low at 7.  She can hold off on phlebtomies.  She appreciated the information

## 2012-04-09 ENCOUNTER — Telehealth: Payer: Self-pay | Admitting: Oncology

## 2012-04-09 ENCOUNTER — Ambulatory Visit (HOSPITAL_BASED_OUTPATIENT_CLINIC_OR_DEPARTMENT_OTHER): Payer: Medicare Other | Admitting: Nurse Practitioner

## 2012-04-09 DIAGNOSIS — I4891 Unspecified atrial fibrillation: Secondary | ICD-10-CM

## 2012-04-09 NOTE — Telephone Encounter (Signed)
gv and printed appt schedule for JAN and April for pt.

## 2012-04-09 NOTE — Progress Notes (Signed)
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 She initially required phlebotomy December 2010 at which time the ferritin was 248. She was started on monthly phlebotomy program. Ferritin was down to 58 by 02/01/2010. Interval between phlebotomies was decreased every 3 months. In October 2012 the ferritin was down to 10. She was last phlebotomized in January of this year at which time the ferritin was 12. She had also become anemic with a hemoglobin of 10.6. Labs done 11/24/2011 returned with a ferritin of 10 and hemoglobin 11.7. Most recent ferritin on 04/02/2012 returned at 7 and hemoglobin 12.  [name] reports that she feels well. No interim illnesses or infections. She denies any bleeding. No chest pain or shortness of breath.  Objective: Blood pressure 110/66, pulse 70, temperature 99.1 F (37.3 C), temperature source Oral, resp. rate 20, height 5\' 4"  (1.626 m), weight 144 lb 6.4 oz (65.499 kg).  Oropharynx is without thrush or ulceration. No palpable cervical, supraclavicular or axillary lymph nodes. Lungs are clear. Regular cardiac rhythm. Abdomen is soft and nontender. No organomegaly. Extremities are without edema. Calves are soft and nontender.  Lab Results: Lab Results  Component Value Date   WBC 9.1 04/02/2012   HGB 12.0 04/02/2012   HCT 36.0 04/02/2012   MCV 94.7 04/02/2012   PLT 296 04/02/2012    Chemistry:    Chemistry      Component Value Date/Time   NA 140 02/15/2012 1444   K 4.1 02/15/2012 1444   CL 105 02/15/2012 1444   CO2 28 02/15/2012 1444   BUN 11 02/15/2012 1444   CREATININE 0.7 02/15/2012 1444      Component Value Date/Time   CALCIUM 9.4 02/15/2012 1444   ALKPHOS 59 12/07/2010 1341   AST 19 12/07/2010 1341   ALT 22 12/07/2010 1341   BILITOT 0.6 12/07/2010 1341       Studies/Results: No results found.  Medications: I have reviewed the patient's current  medications.  Assessment/Plan:  1. Homozygote for the C282Y hemachromatosis gene. She was last phlebotomized in January of this year. Most recent ferritin on 04/02/2012 returned at 7. 2. History of atrial fibrillation. 3. Hypothyroid on replacement.  Disposition-[name]'s ferritin remains low. We will have her return at a three-month interval for a repeat ferritin and CBC. She will return for a followup visit in 6 months. She will contact the office in the interim with any problems.  Lonna Cobb ANP/GNP-BC

## 2012-06-07 ENCOUNTER — Ambulatory Visit: Payer: Medicare Other | Admitting: Internal Medicine

## 2012-07-03 ENCOUNTER — Other Ambulatory Visit: Payer: Medicare Other | Admitting: Lab

## 2012-07-17 ENCOUNTER — Ambulatory Visit (INDEPENDENT_AMBULATORY_CARE_PROVIDER_SITE_OTHER): Payer: Medicare Other | Admitting: Internal Medicine

## 2012-07-17 ENCOUNTER — Encounter: Payer: Self-pay | Admitting: Internal Medicine

## 2012-07-17 VITALS — BP 102/65 | HR 60 | Ht 63.5 in | Wt 149.8 lb

## 2012-07-17 DIAGNOSIS — I4891 Unspecified atrial fibrillation: Secondary | ICD-10-CM

## 2012-07-17 NOTE — Assessment & Plan Note (Signed)
Paroxysmal atrial fibrillation. We'll after discussed with Dr. Lavone Nian whether she needs to be on aspirin; probably not from afib point of view

## 2012-07-17 NOTE — Progress Notes (Signed)
skf Patient Care Team: Teena Irani as PCP - General (Physician Assistant)   HPI  [name] is a 72 y.o. female seen in followup for atrial fibrillation Occurring in the context of hemochromatosis   She is doing quite well. With only infrequent arrhythmia  Her phlebotomies are becoming increasingly infrequent    She also has been diagnosed as having hemochromatosis identified by the C282Y gene mutation for which she is homozygous, and by , piss poor luck, with her heterozygous spouse, she gave birth to three daughters all of whom are homozygous.     She is having dyspnea and underwent an echo And a stress test. I could find a former. I could not find the latter. I do see note however from Tereso Newcomer that both were read as normal.     Past Medical History  Diagnosis Date  . Atrial fibrillation     a. Flecainide therapy;  b. event monitor 4/12  . Hemochromatosis     indentified by the C282Y gene mutation; Dr. Cyndie Chime  . History of syncope   . Hypothyroidism   . Depression with anxiety   . GERD (gastroesophageal reflux disease)   . Chest pain     a. GXT myoview 4/12: no isch., EF 86%;   b. echo 4/12: EF 55-65%, grade 1 diast dysfxn, LAE    Past Surgical History  Procedure Date  . Total abdominal hysterectomy   . Tonsillectomy     Current Outpatient Prescriptions  Medication Sig Dispense Refill  . aspirin 81 MG EC tablet Take 81 mg by mouth daily.        . Calcium Carbonate 1500 MG TABS Take 1 tablet by mouth as needed.       . Cholecalciferol (VITAMIN D) 1000 UNITS capsule Take 1,000 Units by mouth as needed.       . flecainide (TAMBOCOR) 100 MG tablet Take 100 mg by mouth. 1/2 tablet in the morning and 1/2 tablet in the evening        . folic acid (FOLVITE) 1 MG tablet Take 1 mg by mouth daily.        Marland Kitchen gabapentin (NEURONTIN) 300 MG capsule Take 1 capsule (300 mg total) by mouth at bedtime as needed.      Marland Kitchen ibuprofen (ADVIL,MOTRIN) 800 MG tablet Take 1  tablet (800 mg total) by mouth as needed for pain.      Marland Kitchen levothyroxine (SYNTHROID, LEVOTHROID) 112 MCG tablet Take 112 mcg by mouth daily.        . metoprolol succinate (TOPROL-XL) 25 MG 24 hr tablet Take 0.5 tablets (12.5 mg total) by mouth daily.  30 tablet  11  . pantoprazole (PROTONIX) 40 MG tablet Take 40 mg by mouth daily.      Marland Kitchen VITAMIN B1-B12 IM Inject 1,000 mcg into the muscle every 30 (thirty) days.          No Known Allergies  Review of Systems negative except from HPI and PMH  Physical Exam BP 102/65  Pulse 60  Ht 5' 3.5" (1.613 m)  Wt 149 lb 12.8 oz (67.949 kg)  BMI 26.12 kg/m2 Well developed and well nourished in no acute distress HENT normal E scleral and icterus clear Neck Supple JVP flat; carotids brisk and full Clear to ausculation Regular rate and rhythm, no murmurs gallops or rub Soft with active bowel sounds No clubbing cyanosis none Edema Alert and oriented, grossly normal motor and sensory function Skin Warm and Dry  ECG demonstrates sinus at 69 Interval 17/07/41 Low-voltage  Assessment and  Plan

## 2012-07-17 NOTE — Patient Instructions (Signed)
Your physician wants you to follow-up in: 1 year with Dr. Klein. You will receive a reminder letter in the mail two months in advance. If you don't receive a letter, please call our office to schedule the follow-up appointment.  Your physician recommends that you continue on your current medications as directed. Please refer to the Current Medication list given to you today.  

## 2012-08-13 ENCOUNTER — Telehealth: Payer: Self-pay | Admitting: Oncology

## 2012-08-13 NOTE — Telephone Encounter (Signed)
Pt called and r/s appt labs from 4/4 to 3/24, pt aware of appts

## 2012-09-17 ENCOUNTER — Other Ambulatory Visit (HOSPITAL_BASED_OUTPATIENT_CLINIC_OR_DEPARTMENT_OTHER): Payer: Medicare Other | Admitting: Lab

## 2012-09-17 LAB — CBC WITH DIFFERENTIAL/PLATELET
Basophils Absolute: 0.1 10*3/uL (ref 0.0–0.1)
EOS%: 4.3 % (ref 0.0–7.0)
HGB: 12.3 g/dL (ref 11.6–15.9)
MCH: 32.8 pg (ref 25.1–34.0)
MCV: 97.2 fL (ref 79.5–101.0)
MONO%: 8.4 % (ref 0.0–14.0)
RDW: 15 % — ABNORMAL HIGH (ref 11.2–14.5)

## 2012-09-17 LAB — COMPREHENSIVE METABOLIC PANEL (CC13)
AST: 23 U/L (ref 5–34)
Alkaline Phosphatase: 61 U/L (ref 40–150)
BUN: 14.3 mg/dL (ref 7.0–26.0)
Creatinine: 0.7 mg/dL (ref 0.6–1.1)
Potassium: 3.8 mEq/L (ref 3.5–5.1)

## 2012-09-25 ENCOUNTER — Other Ambulatory Visit: Payer: Medicare Other | Admitting: Lab

## 2012-09-28 ENCOUNTER — Ambulatory Visit (HOSPITAL_BASED_OUTPATIENT_CLINIC_OR_DEPARTMENT_OTHER): Payer: Medicare Other | Admitting: Oncology

## 2012-09-28 ENCOUNTER — Telehealth: Payer: Self-pay | Admitting: Oncology

## 2012-09-28 ENCOUNTER — Other Ambulatory Visit: Payer: Medicare Other | Admitting: Lab

## 2012-09-28 NOTE — Patient Instructions (Signed)
Lab and vist 1 week after with NP in August Lab and visit 1 week later with MD in 1 year

## 2012-09-28 NOTE — Telephone Encounter (Signed)
gv and printed appt schedule for pt for Aug and March 2015

## 2012-09-30 NOTE — Progress Notes (Signed)
Hematology and Oncology Follow Up Visit  [name] [id number] 1941/01/24 72 y.o. 09/30/2012 10:25 AM   Principle Diagnosis: Encounter Diagnosis  Name Primary?  . Hereditary hemochromatosis Yes     Interim History:   Followup visit for this pleasant 72 year old woman who is a homozygote for the C282Y hemachromatosis gene. She has an interesting family history since the gene is dominant but variably expressed in different family members. Her brother had clinical hemachromatosis and developed end-stage liver disease requiring transplant and ultimately died of complications of his disease. [name] has only required a rare phlebotomy since her ferritin levels run low. Her 3 daughters are affected. 2 of the 3 have not required phlebotomies despite homozygous status.  She's had no interim medical problems.  Medications: reviewed  Allergies: No Known Allergies  Review of Systems: Constitutional:   No constitutional symptoms Respiratory: No cough or dyspnea Cardiovascular:  No chest pain or palpitations Gastrointestinal: No change in bowel habit Genito-Urinary: No urinary tract symptoms, no vaginal bleeding Musculoskeletal: No muscle bone or joint pain Neurologic: No headache or change in vision Skin: No rash or ecchymosis Remaining ROS negative.  Physical Exam: Blood pressure 120/59, pulse 67, temperature 97.9 F (36.6 C), temperature source Oral, resp. rate 20, height 5' 3.5" (1.613 m), weight 144 lb 14.4 oz (65.726 kg). Wt Readings from Last 3 Encounters:  09/28/12 144 lb 14.4 oz (65.726 kg)  07/17/12 149 lb 12.8 oz (67.949 kg)  04/09/12 144 lb 6.4 oz (65.499 kg)     General appearance: Thin Caucasian woman HENNT: Pharynx no erythema or exudate Lymph nodes: No adenopathy Breasts: Lungs: Clear to auscultation resonant to percussion Heart: Regular rhythm no murmur Abdomen: Soft, nontender, no mass, no organomegaly Extremities: No edema, no calf tenderness Vascular:  No cyanosis Neurologic: No focal deficit. Skin: No rash or ecchymosis. No bronzing  Lab Results: Lab Results  Component Value Date   WBC 5.0 09/17/2012   HGB 12.3 09/17/2012   HCT 36.3 09/17/2012   MCV 97.2 09/17/2012   PLT 207 09/17/2012     Chemistry      Component Value Date/Time   NA 140 09/17/2012 1136   NA 140 02/15/2012 1444   K 3.8 09/17/2012 1136   K 4.1 02/15/2012 1444   CL 105 09/17/2012 1136   CL 105 02/15/2012 1444   CO2 26 09/17/2012 1136   CO2 28 02/15/2012 1444   BUN 14.3 09/17/2012 1136   BUN 11 02/15/2012 1444   CREATININE 0.7 09/17/2012 1136   CREATININE 0.7 02/15/2012 1444      Component Value Date/Time   CALCIUM 9.1 09/17/2012 1136   CALCIUM 9.4 02/15/2012 1444   ALKPHOS 61 09/17/2012 1136   ALKPHOS 59 12/07/2010 1341   AST 23 09/17/2012 1136   AST 19 12/07/2010 1341   ALT 21 09/17/2012 1136   ALT 22 12/07/2010 1341   BILITOT 0.37 09/17/2012 1136   BILITOT 0.6 12/07/2010 1341    Ferritin 11 done 09/17/2012 with highest value recorded in the last 3 years of 262 in April 2011  Impression and Plan: Homozygous status for the C282Y hemachromatosis gene. Low expression with chronically low ferritins with minimal phlebotomy requirements. Plan: Continue to monitor lab studies every 3 months. Phlebotomy only if ferritin is just up over 150.   CC:.    Levert Feinstein, MD 4/6/201410:25 AM

## 2012-11-14 ENCOUNTER — Other Ambulatory Visit: Payer: Self-pay | Admitting: Dermatology

## 2012-11-22 ENCOUNTER — Other Ambulatory Visit: Payer: Self-pay | Admitting: *Deleted

## 2012-11-22 DIAGNOSIS — R42 Dizziness and giddiness: Secondary | ICD-10-CM

## 2012-11-22 DIAGNOSIS — I4891 Unspecified atrial fibrillation: Secondary | ICD-10-CM

## 2012-11-22 MED ORDER — METOPROLOL SUCCINATE ER 25 MG PO TB24: 12.5000 mg | ORAL_TABLET | Freq: Every day | ORAL | Status: DC

## 2013-01-17 ENCOUNTER — Telehealth: Payer: Self-pay | Admitting: Oncology

## 2013-01-17 NOTE — Telephone Encounter (Signed)
Pt called and r/s appt to 8/8/ lab and ML

## 2013-01-25 ENCOUNTER — Ambulatory Visit: Payer: Medicare Other | Admitting: Nurse Practitioner

## 2013-01-25 ENCOUNTER — Other Ambulatory Visit: Payer: Medicare Other | Admitting: Lab

## 2013-02-01 ENCOUNTER — Other Ambulatory Visit (HOSPITAL_BASED_OUTPATIENT_CLINIC_OR_DEPARTMENT_OTHER): Payer: Medicare Other | Admitting: Lab

## 2013-02-01 ENCOUNTER — Telehealth: Payer: Self-pay | Admitting: Oncology

## 2013-02-01 ENCOUNTER — Ambulatory Visit (HOSPITAL_BASED_OUTPATIENT_CLINIC_OR_DEPARTMENT_OTHER): Payer: Medicare Other | Admitting: Nurse Practitioner

## 2013-02-01 NOTE — Telephone Encounter (Signed)
gv and printed appt sched and avs for pt  °

## 2013-02-01 NOTE — Progress Notes (Signed)
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 She initially required phlebotomy December 2010 at which time the ferritin was 248. She was started on monthly phlebotomy program. Ferritin was down to 58 by 02/01/2010. Interval between phlebotomies was decreased every 3 months. In October 2012 the ferritin was down to 10. She was last phlebotomized in January 2013. Subsequent ferritin levels have remained less than 12.  She is seen today for scheduled followup. No interim illnesses or infections. Overall she is feeling well. Only complaint is intermittent pain at the right groin and anterior right thigh. She had the pain intermittently for several years. It has worsened recently. She reports a previous diagnosis of "arthritis". She denies back pain. She has a good appetite. No fevers. No shortness of breath or cough. No bowel or bladder problems.   Objective: Blood pressure 99/66, pulse 67, temperature 97 F (36.1 C), temperature source Oral, resp. rate 18, height 5' 3.5" (1.613 m), weight 146 lb 14.4 oz (66.633 kg).  No thrush or ulcerations. No palpable cervical, supraclavicular or axillary lymph nodes. Lungs are clear. Regular cardiac rhythm. Abdomen is soft and nontender. No organomegaly. Extremities are without edema. Calves are soft and nontender. Nontender over the right hip region. No discomfort with internal/external rotation of the right hip.  Lab Results: Lab Results  Component Value Date   WBC 5.0 09/17/2012   HGB 12.3 09/17/2012   HCT 36.3 09/17/2012   MCV 97.2 09/17/2012   PLT 207 09/17/2012    Chemistry:    Chemistry      Component Value Date/Time   NA 140 09/17/2012 1136   NA 140 02/15/2012 1444   K 3.8 09/17/2012 1136   K 4.1 02/15/2012 1444   CL 105 09/17/2012 1136   CL 105 02/15/2012 1444   CO2 26 09/17/2012 1136   CO2 28 02/15/2012 1444   BUN 14.3 09/17/2012 1136   BUN 11 02/15/2012 1444   CREATININE 0.7  09/17/2012 1136   CREATININE 0.7 02/15/2012 1444      Component Value Date/Time   CALCIUM 9.1 09/17/2012 1136   CALCIUM 9.4 02/15/2012 1444   ALKPHOS 61 09/17/2012 1136   ALKPHOS 59 12/07/2010 1341   AST 23 09/17/2012 1136   AST 19 12/07/2010 1341   ALT 21 09/17/2012 1136   ALT 22 12/07/2010 1341   BILITOT 0.37 09/17/2012 1136   BILITOT 0.6 12/07/2010 1341       Studies/Results: No results found.  Medications: I have reviewed the patient's current medications.  Assessment/Plan:  1. Homozygote for the C282Y hemachromatosis gene. She was last phlebotomized in January 2013. Ferritin today returned at 10. 2. History of atrial fibrillation. 3. Hypothyroid on replacement. 4. Right hip/leg pain. She plans to followup with her primary care provider.  Disposition-she appears stable. Ferritin remains in goal range. She will return for a repeat ferritin in 3 months and a followup visit in 6 months.  [name] ANP/GNP-BC

## 2013-04-24 ENCOUNTER — Other Ambulatory Visit: Payer: Self-pay | Admitting: Obstetrics and Gynecology

## 2013-04-24 DIAGNOSIS — N644 Mastodynia: Secondary | ICD-10-CM

## 2013-05-03 ENCOUNTER — Encounter (INDEPENDENT_AMBULATORY_CARE_PROVIDER_SITE_OTHER): Payer: Self-pay

## 2013-05-03 ENCOUNTER — Other Ambulatory Visit (HOSPITAL_BASED_OUTPATIENT_CLINIC_OR_DEPARTMENT_OTHER): Payer: Medicare Other

## 2013-05-03 DIAGNOSIS — D649 Anemia, unspecified: Secondary | ICD-10-CM

## 2013-05-03 LAB — FERRITIN CHCC: Ferritin: 17 ng/ml (ref 9–269)

## 2013-05-10 ENCOUNTER — Telehealth: Payer: Self-pay | Admitting: *Deleted

## 2013-05-10 NOTE — Telephone Encounter (Signed)
Message copied by Sabino Snipes on Fri May 10, 2013  1:45 PM ------      Message from: Levert Feinstein      Created: Mon May 06, 2013  9:25 PM       Call pt  Ferritin remains low at 17 ------

## 2013-05-10 NOTE — Telephone Encounter (Signed)
Pt informed of ferritin result per Dr Cyndie Chime.

## 2013-05-14 ENCOUNTER — Ambulatory Visit
Admission: RE | Admit: 2013-05-14 | Discharge: 2013-05-14 | Disposition: A | Discharge status: Home / Self Care | Payer: Medicare Other | Source: Ambulatory Visit | Attending: Obstetrics and Gynecology | Admitting: Obstetrics and Gynecology

## 2013-05-14 DIAGNOSIS — N644 Mastodynia: Secondary | ICD-10-CM

## 2013-05-20 ENCOUNTER — Emergency Department (HOSPITAL_COMMUNITY)
Admission: EM | Admit: 2013-05-20 | Discharge: 2013-05-20 | Disposition: A | Discharge status: Home / Self Care | Payer: Medicare Other | Attending: Emergency Medicine | Admitting: Emergency Medicine

## 2013-05-20 ENCOUNTER — Encounter (HOSPITAL_COMMUNITY): Payer: Self-pay | Admitting: Emergency Medicine

## 2013-05-20 DIAGNOSIS — R002 Palpitations: Secondary | ICD-10-CM | POA: Insufficient documentation

## 2013-05-20 DIAGNOSIS — Z79899 Other long term (current) drug therapy: Secondary | ICD-10-CM | POA: Insufficient documentation

## 2013-05-20 DIAGNOSIS — Z7982 Long term (current) use of aspirin: Secondary | ICD-10-CM | POA: Insufficient documentation

## 2013-05-20 DIAGNOSIS — K219 Gastro-esophageal reflux disease without esophagitis: Secondary | ICD-10-CM | POA: Insufficient documentation

## 2013-05-20 DIAGNOSIS — I4891 Unspecified atrial fibrillation: Secondary | ICD-10-CM

## 2013-05-20 DIAGNOSIS — M79609 Pain in unspecified limb: Secondary | ICD-10-CM | POA: Insufficient documentation

## 2013-05-20 DIAGNOSIS — R011 Cardiac murmur, unspecified: Secondary | ICD-10-CM | POA: Insufficient documentation

## 2013-05-20 DIAGNOSIS — E039 Hypothyroidism, unspecified: Secondary | ICD-10-CM | POA: Insufficient documentation

## 2013-05-20 DIAGNOSIS — R6884 Jaw pain: Secondary | ICD-10-CM | POA: Insufficient documentation

## 2013-05-20 DIAGNOSIS — I209 Angina pectoris, unspecified: Secondary | ICD-10-CM | POA: Insufficient documentation

## 2013-05-20 DIAGNOSIS — F341 Dysthymic disorder: Secondary | ICD-10-CM | POA: Insufficient documentation

## 2013-05-20 DIAGNOSIS — Z87891 Personal history of nicotine dependence: Secondary | ICD-10-CM | POA: Insufficient documentation

## 2013-05-20 LAB — CBC WITH DIFFERENTIAL/PLATELET
Basophils Absolute: 0.1 10*3/uL (ref 0.0–0.1)
HCT: 39.4 % (ref 36.0–46.0)
Hemoglobin: 13.7 g/dL (ref 12.0–15.0)
Lymphocytes Relative: 31 % (ref 12–46)
Lymphs Abs: 2 10*3/uL (ref 0.7–4.0)
MCV: 99 fL (ref 78.0–100.0)
Monocytes Absolute: 0.7 10*3/uL (ref 0.1–1.0)
Monocytes Relative: 11 % (ref 3–12)
Neutro Abs: 3.2 10*3/uL (ref 1.7–7.7)
Neutrophils Relative %: 50 % (ref 43–77)
RDW: 13.6 % (ref 11.5–15.5)
WBC: 6.5 10*3/uL (ref 4.0–10.5)

## 2013-05-20 LAB — BASIC METABOLIC PANEL
BUN: 11 mg/dL (ref 6–23)
CO2: 28 mEq/L (ref 19–32)
Chloride: 105 mEq/L (ref 96–112)
Creatinine, Ser: 0.56 mg/dL (ref 0.50–1.10)
GFR calc Af Amer: >90 mL/min (ref >90)
Glucose, Bld: 99 mg/dL (ref 70–99)
Potassium: 3.4 mEq/L — ABNORMAL LOW (ref 3.5–5.1)

## 2013-05-20 LAB — MAGNESIUM: Magnesium: 1.8 mg/dL (ref 1.5–2.5)

## 2013-05-20 LAB — PHOSPHORUS: Phosphorus: 3.1 mg/dL (ref 2.3–4.6)

## 2013-05-20 LAB — TROPONIN I: Troponin I: <0.3 ng/mL (ref <0.30)

## 2013-05-20 MED ORDER — ASPIRIN 81 MG PO CHEW
162.0000 mg | CHEWABLE_TABLET | Freq: Once | ORAL | Status: AC
  Administered 2013-05-20: 162 mg via ORAL
  Filled 2013-05-20: qty 2

## 2013-05-20 MED ORDER — FLECAINIDE ACETATE 50 MG PO TABS
50.0000 mg | ORAL_TABLET | Freq: Once | ORAL | Status: AC
  Administered 2013-05-20: 50 mg via ORAL
  Filled 2013-05-20: qty 1

## 2013-05-20 MED ORDER — SODIUM CHLORIDE 0.9 % IV BOLUS (SEPSIS)
1000.0000 mL | Freq: Once | INTRAVENOUS | Status: AC
  Administered 2013-05-20: 1000 mL via INTRAVENOUS

## 2013-05-20 NOTE — ED Notes (Signed)
Patient found waking up from chest pain, some pain in arm and neck, took 325mg  asa, ems arrived pt in rapid heart rate 184, afib  Rvr, heart rate converted before intervention were needed

## 2013-05-28 ENCOUNTER — Encounter: Payer: Self-pay | Admitting: Physician Assistant

## 2013-05-28 ENCOUNTER — Ambulatory Visit (INDEPENDENT_AMBULATORY_CARE_PROVIDER_SITE_OTHER): Payer: Medicare Other | Admitting: Physician Assistant

## 2013-05-28 VITALS — BP 126/74 | HR 69 | Ht 64.0 in | Wt 146.0 lb

## 2013-05-28 DIAGNOSIS — E039 Hypothyroidism, unspecified: Secondary | ICD-10-CM

## 2013-05-28 DIAGNOSIS — I4891 Unspecified atrial fibrillation: Secondary | ICD-10-CM

## 2013-05-28 DIAGNOSIS — E876 Hypokalemia: Secondary | ICD-10-CM

## 2013-05-28 LAB — BASIC METABOLIC PANEL
CO2: 26 mEq/L (ref 19–32)
Calcium: 9.4 mg/dL (ref 8.4–10.5)
Chloride: 104 mEq/L (ref 96–112)
Potassium: 4.3 mEq/L (ref 3.5–5.1)
Sodium: 138 mEq/L (ref 135–145)

## 2013-05-28 MED ORDER — METOPROLOL SUCCINATE ER 25 MG PO TB24: 25.0000 mg | ORAL_TABLET | Freq: Every day | ORAL | Status: DC

## 2013-05-28 NOTE — Progress Notes (Signed)
[address] [place], [place]  [address] Phone: [phone] Fax:  [phone]  Date:  05/28/2013   ID:  [name], DOB 06-27-1941, MRN [id number]  PCP:  Teena Irani, PA-C  Cardiologist:  Dr. Sherryl Manges     History of Present Illness: [name] is a 72 y.o. female with a h/o atrial fibrillation largely controlled by Flecainide and hereditary hemochromatosis. Myoview 10/14/10: no ischemia and an EF of 86%. Echocardiogram: EF of 55-60%, Grade 1 diastolic dysfunction and LAE. Event monitor 5/12: mainly NSR with PACs. She did have 2 brief episodes of WCT but no AFib.  Last seen by Dr. Sherryl Manges 06/2012.  She was seen in the ED 11/24 with chest pain and palpitations.  ED notes indicate she was in AFib with RVR (HR 181) when EMS arrived but she converted to NSR spontaneously.  All ECGs in the ED demonstrate NSR.  CEs were negative.  She was d/c to home to f/u with cardiology.  She notes that she awoke from sleep around 12 AM with severe chest, L arm and jaw pain.  She thought she was having a MI.  She called EMS and notes that she felt better when she converted to NSR in the ambulance.  She had similar episodes with her AFib 10+ years ago.  She has not had any further palpitations, chest pain, dyspnea since her trip to the ED.  She denies orthopnea, PND, edema.  No syncope.   Recent Labs: 09/17/2012: ALT 21  05/20/2013: Creatinine 0.56; Hemoglobin 13.7; Potassium 3.4*   Wt Readings from Last 3 Encounters:  05/28/13 146 lb (66.225 kg)  05/20/13 145 lb (65.772 kg)  02/01/13 146 lb 14.4 oz (66.633 kg)     Past Medical History  Diagnosis Date  . Atrial fibrillation     a. Flecainide therapy;  b. event monitor 4/12  . Hemochromatosis     indentified by the C282Y gene mutation; Dr. Cyndie Chime  . History of syncope   . Hypothyroidism   . Depression with anxiety   . GERD (gastroesophageal reflux disease)   . Chest pain     a. GXT myoview 4/12: no isch., EF  86%;   b. echo 4/12: EF 55-65%, grade 1 diast dysfxn, LAE    Current Outpatient Prescriptions  Medication Sig Dispense Refill  . aspirin 81 MG EC tablet Take 81 mg by mouth daily.        . Calcium Carbonate 1500 MG TABS Take 1 tablet by mouth 3 (three) times a week.      . cholecalciferol (VITAMIN D) 1000 UNITS tablet Take 1,000 Units by mouth 3 (three) times a week.      . flecainide (TAMBOCOR) 100 MG tablet Take 100 mg by mouth. 1/2 tablet in the morning and 1/2 tablet in the evening        . folic acid (FOLVITE) 1 MG tablet Take 1 mg by mouth daily.        Marland Kitchen levothyroxine (SYNTHROID, LEVOTHROID) 112 MCG tablet Take 112 mcg by mouth daily.        . metoprolol succinate (TOPROL-XL) 25 MG 24 hr tablet Take 0.5 tablets (12.5 mg total) by mouth daily.  90 tablet  3  . pantoprazole (PROTONIX) 40 MG tablet Take 40 mg by mouth daily.      Marland Kitchen VITAMIN B1-B12 IM Inject 1,000 mcg into the muscle every 30 (thirty) days.       Marland Kitchen VITAMIN E PO Take  1 tablet by mouth 3 (three) times a week.       No current facility-administered medications for this visit.    Allergies:   Review of patient's allergies indicates no known allergies.   Social History:  The patient  reports that she quit smoking about 15 years ago. She has never used smokeless tobacco. She reports that she drinks about 0.5 ounces of alcohol per week. She reports that she does not use illicit drugs.   Family History:  The patient's family history includes Heart disease (age of onset: 38) in her father; Hemochromatosis in her brother; Stroke in her mother.   ROS:  Please see the history of present illness.      All other systems reviewed and negative.   PHYSICAL EXAM: VS:  BP 126/74  Pulse 69  Ht 5\' 4"  (1.626 m)  Wt 146 lb (66.225 kg)  BMI 25.05 kg/m2 Well nourished, well developed, in no acute distress HEENT: normal Neck: no JVD Endocrine:  No TM Cardiac:  normal S1, S2; RRR; no murmur Lungs:  clear to auscultation bilaterally, no  wheezing, rhonchi or rales Abd: soft, nontender, no hepatomegaly Ext: no edema Skin: warm and dry Neuro:  CNs 2-12 intact, no focal abnormalities noted  EKG:  NSR, HR 69, NSSTTW changes     ASSESSMENT AND PLAN:  1. Atrial Fibrillation:  There are no ECGs or tele strips available from 11/24 that demonstrate AFib.  She had fairly consistent symptoms 10+ years ago and her symptoms resolved with restoration of NSR.  I reviewed her case with Dr. Sherryl Manges.  We will keep her on the same dose of Flecainide.  I will increase her Toprol XL to 25 mg QD. She can take an extra Toprol XL 25 mg if she has recurrent symptoms.  She will need earlier follow up if she has recurrent episodes.  CHADS2=0.  She remains on ASA only.  2. Hypothyroidism:  Check TSH. 3. Hypokalemia:  Check BMET today to follow up on K+. 4. Disposition:  Keep F/u with Dr. Sherryl Manges in 07/2013 as planned.   Signed, Tereso Newcomer, PA-C  05/28/2013 10:10 AM

## 2013-05-28 NOTE — Patient Instructions (Addendum)
Keep with follow appointment with Dr Graciela Husbands for 08/19/12 at 11:00am.  Your physician has recommended you make the following change in your medication: INCREASE TOPROL 25 MG DAILY, OK to take an extra toprol 25 mg  x 1 for palpatations/chest pain( atrial fib )  Your physician recommends that you have lab work today ( BMET & TSH )

## 2013-06-01 NOTE — ED Provider Notes (Signed)
CSN: [id number]     Arrival date & time 05/20/13  0103 History   First MD Initiated Contact with Patient 05/20/13 0109     Chief Complaint  Patient presents with  . Tachycardia    pt at midnight woke up for chest pain, took 324mg  po, ems arrived pt in afib rvr   (Consider location/radiation/quality/duration/timing/severity/associated sxs/prior Treatment) HPI Comments: 72 y.o. female with a h/o atrial fibrillation largely controlled by Flecainide and hereditary hemochromatosis. Myoview 10/14/10: no ischemia and an EF of 86%. Echocardiogram: EF of 55-60%, Grade 1 diastolic dysfunction and LAE.   Pt comes in with cc of chest pain. She notes that she awoke from sleep around 12 AM with severe chest, L arm and jaw pain.  She thought she was having a MI.  She called EMS - who noted that patient was in afib with RVR. Pt notes that she felt better when she converted to NSR in the ambulance.  She had similar episodes with her AFib 10+ years ago.  She has not had any further palpitations, chest pain, dyspnea since her trip to the ED.  She denies orthopnea, PND, edema.  No syncope. No recent infections. Pt does admit to occasionally missing flecainide. No hx of PE, DVT, and no risk factors for the same.   The history is provided by the patient and medical records.    Past Medical History  Diagnosis Date  . Atrial fibrillation     a. Flecainide therapy;  b. event monitor 4/12  . Hemochromatosis     indentified by the C282Y gene mutation; Dr. Cyndie Chime  . History of syncope   . Hypothyroidism   . Depression with anxiety   . GERD (gastroesophageal reflux disease)   . Chest pain     a. GXT myoview 4/12: no isch., EF 86%;   b. echo 4/12: EF 55-65%, grade 1 diast dysfxn, LAE   Past Surgical History  Procedure Laterality Date  . Total abdominal hysterectomy    . Tonsillectomy     Family History  Problem Relation Age of Onset  . Stroke Mother   . Heart disease Father 5  . Hemochromatosis  Brother    History  Substance Use Topics  . Smoking status: Former Smoker    Quit date: 06/27/1997  . Smokeless tobacco: Never Used  . Alcohol Use: 0.5 oz/week    1 drink(s) per week     Comment: 2 DRINKS DAILY   OB History   Grav Para Term Preterm Abortions TAB SAB Ect Mult Living                 Review of Systems  Constitutional: Negative for fever and activity change.  HENT: Negative for facial swelling.   Respiratory: Negative for cough, shortness of breath and wheezing.   Cardiovascular: Positive for chest pain and palpitations.  Gastrointestinal: Negative for nausea, vomiting, abdominal pain, diarrhea, constipation, blood in stool and abdominal distention.  Genitourinary: Negative for dysuria, hematuria and difficulty urinating.  Musculoskeletal: Negative for neck pain.  Skin: Negative for color change.  Neurological: Negative for syncope, speech difficulty and headaches.  Hematological: Does not bruise/bleed easily.  Psychiatric/Behavioral: Negative for confusion.    Allergies  Review of patient's allergies indicates no known allergies.  Home Medications   Current Outpatient Rx  Name  Route  Sig  Dispense  Refill  . aspirin 81 MG EC tablet   Oral   Take 81 mg by mouth daily.           Marland Kitchen  Calcium Carbonate 1500 MG TABS   Oral   Take 1 tablet by mouth 3 (three) times a week.         . cholecalciferol (VITAMIN D) 1000 UNITS tablet   Oral   Take 1,000 Units by mouth 3 (three) times a week.         . flecainide (TAMBOCOR) 100 MG tablet   Oral   Take 100 mg by mouth. 1/2 tablet in the morning and 1/2 tablet in the evening           . folic acid (FOLVITE) 1 MG tablet   Oral   Take 1 mg by mouth daily.           Marland Kitchen levothyroxine (SYNTHROID, LEVOTHROID) 112 MCG tablet   Oral   Take 112 mcg by mouth daily.           . pantoprazole (PROTONIX) 40 MG tablet   Oral   Take 40 mg by mouth daily.         Marland Kitchen VITAMIN E PO   Oral   Take 1 tablet by  mouth 3 (three) times a week.         . metoprolol succinate (TOPROL-XL) 25 MG 24 hr tablet   Oral   Take 1 tablet (25 mg total) by mouth daily.   90 tablet   3   . VITAMIN B1-B12 IM   Intramuscular   Inject 1,000 mcg into the muscle every 30 (thirty) days.           BP 121/57  Pulse 73  Temp(Src) 98 F (36.7 C) (Oral)  Resp 16  Ht 5\' 4"  (1.626 m)  Wt 145 lb (65.772 kg)  BMI 24.88 kg/m2  SpO2 93% Physical Exam  Nursing note and vitals reviewed. Constitutional: She is oriented to person, place, and time. She appears well-developed and well-nourished.  HENT:  Head: Normocephalic and atraumatic.  Eyes: EOM are normal. Pupils are equal, round, and reactive to light.  Neck: Neck supple.  Cardiovascular: Normal rate and regular rhythm.   Murmur heard. Pulmonary/Chest: Effort normal. No respiratory distress.  Abdominal: Soft. She exhibits no distension. There is no tenderness. There is no rebound and no guarding.  Musculoskeletal: She exhibits no edema and no tenderness.  Neurological: She is alert and oriented to person, place, and time.  Skin: Skin is warm and dry.    ED Course  Procedures (including critical care time) Labs Review Labs Reviewed  CBC WITH DIFFERENTIAL - Abnormal; Notable for the following:    MCH 34.4 (*)    Eosinophils Relative 8 (*)    All other components within normal limits  BASIC METABOLIC PANEL - Abnormal; Notable for the following:    Potassium 3.4 (*)    All other components within normal limits  MAGNESIUM  PHOSPHORUS  TROPONIN I  TROPONIN I   Imaging Review No results found.  EKG Interpretation    Date/Time:  Monday May 20 2013 01:16:22 EST Ventricular Rate:  94 PR Interval:  154 QRS Duration: 88 QT Interval:  373 QTC Calculation: 466 R Axis:   -38 Text Interpretation:  Sinus rhythm Left axis deviation Low voltage, precordial leads RSR' in V1 or V2, right VCD or RVH Borderline T abnormalities, anterior leads ED PHYSICIAN  INTERPRETATION AVAILABLE IN [facility] Confirmed by TEST, RECORD ([id number]) on 05/22/2013 2:20:33 PM            MDM   1. Atrial fibrillation   2. Angina pectoris  EKG Interpretation    Date/Time:  Monday May 20 2013 01:16:22 EST Ventricular Rate:  94 PR Interval:  154 QRS Duration: 88 QT Interval:  373 QTC Calculation: 466 R Axis:   -38 Text Interpretation:  Sinus rhythm Left axis deviation Low voltage, precordial leads RSR' in V1 or V2, right VCD or RVH Borderline T abnormalities, anterior leads ED PHYSICIAN INTERPRETATION AVAILABLE IN [facility] Confirmed by TEST, RECORD ([id number]) on 05/22/2013 2:20:33 PM            Pt comes in with cc of chest pain. Hx of afib, on ASA and flecainide. Chest pain was accompanied by afib with rvr. Now chest pain free, and is sinus rhythm. EKG shows no acute signs of MI. Pt was observed for extended period of time in the ED and remained chest pain free and is sinus rhythm.  The chest pain with afib with rvr - could be essentially a positive stress. Cards f/u stressed. Pt reliable, has a Data processing manager. Will d.c   Derwood Kaplan, MD 06/01/13 [phone]

## 2013-06-06 ENCOUNTER — Telehealth: Payer: Self-pay | Admitting: Internal Medicine

## 2013-06-06 NOTE — Telephone Encounter (Signed)
New Problem:  Pt states she was recently given  Etodolac 400 mg tablets, one tab twice daily for inflammation and pain.. Pt is calling to see if this medication is ok for her to take. Pt states it says the medication may cause heart problems. PT is requesting a call back. If she does not answer... Pt states it is ok to speak with her husband, Mr Whitebread or her daughter Carollee Herter.

## 2013-06-07 NOTE — Telephone Encounter (Signed)
Ok to take per Dr. Graciela Husbands. I did overview with her that the longer she takes this medication the greater risk she has for heart attack/stroke. Patient verbalized understanding and agreeable to plan.

## 2013-08-19 ENCOUNTER — Ambulatory Visit (INDEPENDENT_AMBULATORY_CARE_PROVIDER_SITE_OTHER): Payer: Medicare Other | Admitting: Internal Medicine

## 2013-08-19 ENCOUNTER — Encounter: Payer: Self-pay | Admitting: Internal Medicine

## 2013-08-19 VITALS — BP 113/69 | HR 59 | Ht 63.5 in | Wt 146.0 lb

## 2013-08-19 DIAGNOSIS — I4891 Unspecified atrial fibrillation: Secondary | ICD-10-CM

## 2013-08-19 NOTE — Progress Notes (Signed)
Patient Care Team: Aura Dials as PCP - General (Physician Assistant)   HPI  [name] is a 73 y.o. female seen in followup for atrial fibrillation occurring in the context of hemochromatosis  She was seen in the emergency room 11/14 for chest pain and palpitations. Per EMS note, she was in atrial fibrillation on their arrival; was in sinus by the time she was seen in the ER. It was elected to keep her on her flecainide  Her CHADS-VASc score is 2; age/gender.   Her phlebotomies are becoming increasingly infrequent  She also has been diagnosed as having hemochromatosis identified by the C282Y gene mutation for which she is homozygous, and by , piss poor luck, with her heterozygous spouse, she gave birth to three daughters all of whom are homozygous.      Past Medical History  Diagnosis Date  . Atrial fibrillation     a. Flecainide therapy;  b. event monitor 4/12  . Hemochromatosis     indentified by the C282Y gene mutation; Dr. Beryle Beams  . History of syncope   . Hypothyroidism   . Depression with anxiety   . GERD (gastroesophageal reflux disease)   . Chest pain     a. GXT myoview 4/12: no isch., EF 86%;   b. echo 4/12: EF 55-65%, grade 1 diast dysfxn, LAE    Past Surgical History  Procedure Laterality Date  . Total abdominal hysterectomy    . Tonsillectomy      Current Outpatient Prescriptions  Medication Sig Dispense Refill  . aspirin 81 MG EC tablet Take 81 mg by mouth daily.        . Calcium Carbonate 1500 MG TABS Take 1 tablet by mouth 3 (three) times a week.      . cholecalciferol (VITAMIN D) 1000 UNITS tablet Take 1,000 Units by mouth 3 (three) times a week.      . cyclobenzaprine (FLEXERIL) 10 MG tablet as needed.      . flecainide (TAMBOCOR) 100 MG tablet Take 100 mg by mouth. 1/2 tablet in the morning and 1/2 tablet in the evening        . folic acid (FOLVITE) 1 MG tablet Take 1 mg by mouth daily.        Marland Kitchen levothyroxine (SYNTHROID,  LEVOTHROID) 112 MCG tablet Take 112 mcg by mouth daily.        . metoprolol succinate (TOPROL-XL) 25 MG 24 hr tablet Take 1 tablet (25 mg total) by mouth daily.  90 tablet  3  . omeprazole (PRILOSEC) 40 MG capsule daily.      Baird Cancer ophthalmic ointment as needed.      Marland Kitchen VITAMIN B1-B12 IM Inject 1,000 mcg into the muscle every 30 (thirty) days.       Marland Kitchen VITAMIN E PO Take 1 tablet by mouth 3 (three) times a week.      . zolpidem (AMBIEN) 10 MG tablet as needed.       No current facility-administered medications for this visit.    No Known Allergies  Review of Systems negative except from HPI and PMH  Physical Exam BP 113/69  Pulse 59  Ht 5' 3.5" (1.613 m)  Wt 146 lb (66.225 kg)  BMI 25.45 kg/m2 Well developed and nourished in no acute distress HENT normal Neck supple with JVP-flat Clear Regular rate and rhythm, no murmurs or gallops Abd-soft with active BS No Clubbing cyanosis edema Skin-warm and dry A & Oriented  Grossly  normal sensory and motor function\  ECG demonstrates sinus rhythm at 56 Intervals 16[birth year]44   Assessment and  Plan  1 Atrial Fibrillation:  We'll continue on current medications. In the event that there are clinical recurrences of note, we'll need to discuss anticoagulation probably done in conjunction with hematology.  I suggested that she have recurrent atrial fibrillation she could take 200 mg of flecainide on a one-time basis.  We'll see her again in one year  Laboratories are normal 12/14

## 2013-08-19 NOTE — Patient Instructions (Signed)
Your physician recommends that you continue on your current medications as directed. Please refer to the Current Medication list given to you today.  Your physician wants you to follow-up in: 1 year with Dr. Klein.  You will receive a reminder letter in the mail two months in advance. If you don't receive a letter, please call our office to schedule the follow-up appointment.  

## 2013-08-25 ENCOUNTER — Encounter: Payer: Self-pay | Admitting: Oncology

## 2013-09-11 ENCOUNTER — Other Ambulatory Visit: Payer: Self-pay | Admitting: *Deleted

## 2013-09-11 MED ORDER — FLECAINIDE ACETATE 100 MG PO TABS: ORAL_TABLET | ORAL | Status: DC

## 2013-09-20 ENCOUNTER — Ambulatory Visit: Payer: Medicare Other | Admitting: Oncology

## 2013-09-20 ENCOUNTER — Other Ambulatory Visit: Payer: Medicare Other

## 2013-10-10 ENCOUNTER — Other Ambulatory Visit (INDEPENDENT_AMBULATORY_CARE_PROVIDER_SITE_OTHER): Payer: Medicare Other

## 2013-10-10 LAB — CBC WITH DIFFERENTIAL/PLATELET
BASOS ABS: 0.1 10*3/uL (ref 0.0–0.1)
BASOS PCT: 1 % (ref 0–1)
EOS ABS: 0.3 10*3/uL (ref 0.0–0.7)
Eosinophils Relative: 6 % — ABNORMAL HIGH (ref 0–5)
HCT: 37.2 % (ref 36.0–46.0)
Hemoglobin: 12.6 g/dL (ref 12.0–15.0)
Lymphocytes Relative: 24 % (ref 12–46)
Lymphs Abs: 1.2 10*3/uL (ref 0.7–4.0)
MCH: 32.9 pg (ref 26.0–34.0)
MCHC: 33.9 g/dL (ref 30.0–36.0)
MCV: 97.1 fL (ref 78.0–100.0)
MONO ABS: 0.7 10*3/uL (ref 0.1–1.0)
Monocytes Relative: 14 % — ABNORMAL HIGH (ref 3–12)
NEUTROS ABS: 2.8 10*3/uL (ref 1.7–7.7)
NEUTROS PCT: 55 % (ref 43–77)
Platelets: 266 10*3/uL (ref 150–400)
RBC: 3.83 MIL/uL — ABNORMAL LOW (ref 3.87–5.11)
RDW: 14.1 % (ref 11.5–15.5)
WBC: 5.1 10*3/uL (ref 4.0–10.5)

## 2013-10-10 LAB — COMPLETE METABOLIC PANEL WITH GFR
ALBUMIN: 4 g/dL (ref 3.5–5.2)
ALK PHOS: 61 U/L (ref 39–117)
ALT: 13 U/L (ref 0–35)
AST: 19 U/L (ref 0–37)
BILIRUBIN TOTAL: 0.5 mg/dL (ref 0.2–1.2)
BUN: 9 mg/dL (ref 6–23)
CO2: 30 mEq/L (ref 19–32)
Calcium: 9.2 mg/dL (ref 8.4–10.5)
Chloride: 104 mEq/L (ref 96–112)
Creat: 0.58 mg/dL (ref 0.50–1.10)
GFR, Est African American: >89 mL/min
GFR, Est Non African American: >89 mL/min
GLUCOSE: 81 mg/dL (ref 70–99)
POTASSIUM: 4.2 meq/L (ref 3.5–5.3)
Sodium: 140 mEq/L (ref 135–145)
Total Protein: 6.7 g/dL (ref 6.0–8.3)

## 2013-10-10 LAB — FERRITIN: FERRITIN: 12 ng/mL (ref 10–291)

## 2013-10-10 NOTE — Addendum Note (Signed)
Addended by: Orson Gear on: 10/10/2013 10:29 AM   Modules accepted: Orders

## 2013-10-14 ENCOUNTER — Encounter: Payer: Self-pay | Admitting: Oncology

## 2013-10-14 ENCOUNTER — Ambulatory Visit (INDEPENDENT_AMBULATORY_CARE_PROVIDER_SITE_OTHER): Payer: Medicare Other | Admitting: Oncology

## 2013-10-14 NOTE — Patient Instructions (Signed)
Lab checks every 4 months: 02/10/14, 06/09/14, 10/06/14 Visit with Dr Darnell Level 10/13/14  30 minutes

## 2013-10-14 NOTE — Progress Notes (Signed)
Patient ID: [name], female   DOB: 05-30-1941, 73 y.o.   MRN: [id number] Hematology and Oncology Follow Up Visit  STELLAROSE [name] [id number] 1941-03-28 73 y.o. 10/14/2013 10:49 AM   Principle Diagnosis: Encounter Diagnosis  Name Primary?  . Hereditary hemochromatosis Yes     Interim History:    Followup visit for this pleasant 73 year old woman who is a homozygote for the C282Y hemachromatosis gene. She has an interesting family history since the gene is dominant but variably expressed in different family members. Her brother had clinical hemachromatosis and developed end-stage liver disease requiring transplant and ultimately died of complications of his disease. [name] has only required a rare phlebotomy since her ferritin levels run low. She initially required phlebotomy December 2010 at which time the ferritin was 248. She was started on monthly phlebotomy program. Ferritin was down to 58 by 02/01/2010. Interval between phlebotomies was decreased every 3 months. In October 2012 the ferritin was down to 10. She was last phlebotomized in January 2013. Ferritin 79 consistently low since that time and she has not required subsequent phlebotomy. Lab in anticipation of today's visit done on 10/10/2013 with ferritin of 12.  Her 3 daughters are affected. All are homozygote's. 2 of the 3 have not required phlebotomies despite homozygous status. She has had no interim medical problems. Atrial fibrillation well-controlled on current medications. She is not on any chronic anticoagulation.  Medications: reviewed  Allergies: No Known Allergies  Review of Systems: Hematology:  No bleeding or bruising ENT ROS: No sore throat Breast ROS: She gets annual mammograms Respiratory ROS: No cough or dyspnea Cardiovascular ROS:  No chest pain or palpitations Gastrointestinal ROS: No abdominal pain or change in bowel habit   Genito-Urinary ROS: Not questioned Musculoskeletal ROS: No muscle  bone or joint pain Neurological ROS: No headache or change in vision  Dermatological ROS: No rash  Remaining ROS negative:   Physical Exam: Blood pressure 101/70, pulse 58, temperature 97.6 F (36.4 C), temperature source Oral, height 5\' 4"  (1.626 m), weight 146 lb 12.8 oz (66.588 kg), SpO2 96.00%. Wt Readings from Last 3 Encounters:  10/14/13 146 lb 12.8 oz (66.588 kg)  08/19/13 146 lb (66.225 kg)  05/28/13 146 lb (66.225 kg)     General appearance: Well-nourished Caucasian woman HENNT: Pharynx no erythema, exudate, mass, or ulcer. No thyromegaly or thyroid nodules Lymph nodes: No cervical, supraclavicular, or axillary lymphadenopathy Breasts:  Lungs: Clear to auscultation, resonant to percussion throughout Heart: Regular rhythm, no murmur, no gallop, no rub, no click, no edema Abdomen: Soft, nontender, normal bowel sounds, no mass, no organomegaly Extremities: No edema, no calf tenderness Musculoskeletal: no joint deformities GU:  Vascular: Carotid pulses 2+, no bruits,  Neurologic: Alert, oriented, PERRLA,  nerves grossly normal, motor strength 5 over 5, reflexes 1+ symmetric, upper body coordination normal, gait normal, Skin: No rash or ecchymosis  Lab Results: CBC W/Diff    Component Value Date/Time   WBC 5.1 10/10/2013 1026   WBC 5.0 09/17/2012 1136   RBC 3.83* 10/10/2013 1026   RBC 3.73 09/17/2012 1136   HGB 12.6 10/10/2013 1026   HGB 12.3 09/17/2012 1136   HCT 37.2 10/10/2013 1026   HCT 36.3 09/17/2012 1136   PLT 266 10/10/2013 1026   PLT 207 09/17/2012 1136   MCV 97.1 10/10/2013 1026   MCV 97.2 09/17/2012 1136   MCH 32.9 10/10/2013 1026   MCH 32.8 09/17/2012 1136   MCHC 33.9 10/10/2013 1026   MCHC 33.8 09/17/2012 1136  RDW 14.1 10/10/2013 1026   RDW 15.0* 09/17/2012 1136   LYMPHSABS 1.2 10/10/2013 1026   LYMPHSABS 1.5 09/17/2012 1136   MONOABS 0.7 10/10/2013 1026   MONOABS 0.4 09/17/2012 1136   EOSABS 0.3 10/10/2013 1026   EOSABS 0.2 09/17/2012 1136   BASOSABS 0.1 10/10/2013  1026   BASOSABS 0.1 09/17/2012 1136     Chemistry      Component Value Date/Time   NA 140 10/10/2013 1026   NA 140 09/17/2012 1136   K 4.2 10/10/2013 1026   K 3.8 09/17/2012 1136   CL 104 10/10/2013 1026   CL 105 09/17/2012 1136   CO2 30 10/10/2013 1026   CO2 26 09/17/2012 1136   BUN 9 10/10/2013 1026   BUN 14.3 09/17/2012 1136   CREATININE 0.58 10/10/2013 1026   CREATININE 0.5 05/28/2013 1051   CREATININE 0.7 09/17/2012 1136      Component Value Date/Time   CALCIUM 9.2 10/10/2013 1026   CALCIUM 9.1 09/17/2012 1136   ALKPHOS 61 10/10/2013 1026   ALKPHOS 61 09/17/2012 1136   AST 19 10/10/2013 1026   AST 23 09/17/2012 1136   ALT 13 10/10/2013 1026   ALT 21 09/17/2012 1136   BILITOT 0.5 10/10/2013 1026   BILITOT 0.37 09/17/2012 1136      Impression:  #1. Homozygous status for the C282Y hemachromatosis gene. Ferritin staying low off phlebotomy. #2 decrease frequency of blood checks to every 4 months and clinical visit in one year.  #2. History of gastric fibrillation currently in sinus rhythm on current medications.  #3. Hypothyroid on replacement    CC: Patient Care Team: Aura Dials, PA-C as PCP - General (Physician Assistant)   Annia Belt, MD 4/20/201510:49 AM

## 2014-01-29 ENCOUNTER — Telehealth: Payer: Self-pay | Admitting: Internal Medicine

## 2014-01-29 ENCOUNTER — Ambulatory Visit (INDEPENDENT_AMBULATORY_CARE_PROVIDER_SITE_OTHER): Payer: Medicare Other | Admitting: *Deleted

## 2014-01-29 VITALS — BP 138/85 | HR 56

## 2014-01-29 DIAGNOSIS — I4891 Unspecified atrial fibrillation: Secondary | ICD-10-CM

## 2014-01-29 NOTE — Telephone Encounter (Signed)
New message          Pt is in afib and she believes it is acting up / pt would like to see dr [name] today

## 2014-01-29 NOTE — Telephone Encounter (Signed)
Patient tells me that this morning she felt some pain in her chest/arm/neck that lasted very briefly. She states that she currently feels fine. Pt coming in this morning around 11 am for [name] to do EKG and determine course of action.

## 2014-01-29 NOTE — Patient Instructions (Signed)
Patient EKG showing sinus brady. Pt not in AFib. Pt instructed to call back if symptoms arise/occur, AFib reoccurs. She is agreeable to plan.

## 2014-02-19 ENCOUNTER — Other Ambulatory Visit: Payer: Medicare Other

## 2014-06-09 ENCOUNTER — Other Ambulatory Visit: Payer: Medicare Other

## 2014-07-07 ENCOUNTER — Other Ambulatory Visit: Payer: Self-pay | Admitting: Physician Assistant

## 2014-07-31 ENCOUNTER — Other Ambulatory Visit: Payer: Self-pay | Admitting: Oncology

## 2014-08-25 ENCOUNTER — Encounter: Payer: Self-pay | Admitting: Internal Medicine

## 2014-08-25 ENCOUNTER — Ambulatory Visit (INDEPENDENT_AMBULATORY_CARE_PROVIDER_SITE_OTHER): Payer: Medicare Other | Admitting: Internal Medicine

## 2014-08-25 VITALS — BP 110/64 | HR 62 | Ht 63.5 in | Wt 147.2 lb

## 2014-08-25 DIAGNOSIS — R0689 Other abnormalities of breathing: Secondary | ICD-10-CM

## 2014-08-25 DIAGNOSIS — I480 Paroxysmal atrial fibrillation: Secondary | ICD-10-CM

## 2014-08-25 DIAGNOSIS — R0989 Other specified symptoms and signs involving the circulatory and respiratory systems: Secondary | ICD-10-CM

## 2014-08-25 MED ORDER — METOPROLOL SUCCINATE ER 25 MG PO TB24: ORAL_TABLET | ORAL | Status: DC

## 2014-08-25 NOTE — Progress Notes (Signed)
Patient Care Team: Aura Dials, PA-C as PCP - General (Physician Assistant)   HPI  [name] is a 74 y.o. female seen in followup for atrial fibrillation occurring in the context of hemochromatosis  She was seen in the emergency room 11/14 for chest pain and palpitations. Per EMS note, she was in atrial fibrillation on their arrival; was in sinus by the time she was seen in the ER. It was elected to keep her on her flecainide  Her CHADS-VASc score is 2; age/gender.   Her phlebotomies are becoming increasingly infrequent  She also has been diagnosed as having hemochromatosis identified by the C282Y gene mutation for which she is homozygous, and by , piss poor luck, with her heterozygous spouse, she gave birth to three daughters all of whom are homozygous.    normal nuclear study 4/15  Past Medical History  Diagnosis Date  . Atrial fibrillation     a. Flecainide therapy;  b. event monitor 4/12  . Hemochromatosis     indentified by the C282Y gene mutation; Dr. Beryle Beams  . History of syncope   . Hypothyroidism   . Depression with anxiety   . GERD (gastroesophageal reflux disease)   . Chest pain     a. GXT myoview 4/12: no isch., EF 86%;   b. echo 4/12: EF 55-65%, grade 1 diast dysfxn, LAE    Past Surgical History  Procedure Laterality Date  . Total abdominal hysterectomy    . Tonsillectomy      Current Outpatient Prescriptions  Medication Sig Dispense Refill  . aspirin 81 MG EC tablet Take 81 mg by mouth daily.      . Calcium Carbonate 1500 MG TABS Take 1 tablet by mouth 3 (three) times a week.    . cholecalciferol (VITAMIN D) 1000 UNITS tablet Take 1,000 Units by mouth 3 (three) times a week.    . cyclobenzaprine (FLEXERIL) 10 MG tablet as needed.    . flecainide (TAMBOCOR) 100 MG tablet 1/2 tablet in the morning and 1/2 tablet in the evening 90 tablet 3  . folic acid (FOLVITE) 1 MG tablet Take 1 mg by mouth daily.      Marland Kitchen levothyroxine (SYNTHROID,  LEVOTHROID) 112 MCG tablet Take 112 mcg by mouth daily.      . metoprolol succinate (TOPROL-XL) 25 MG 24 hr tablet Take 1 tablet by mouth  daily 90 tablet 0  . omeprazole (PRILOSEC) 40 MG capsule daily.    Baird Cancer ophthalmic ointment as needed.    Marland Kitchen VITAMIN B1-B12 IM Inject 1,000 mcg into the muscle every 30 (thirty) days.     Marland Kitchen VITAMIN E PO Take 1 tablet by mouth 3 (three) times a week.    . zolpidem (AMBIEN) 10 MG tablet as needed.     No current facility-administered medications for this visit.    No Known Allergies  Review of Systems negative except from HPI and PMH  Physical Exam BP 110/64 mmHg  Pulse 62  Ht 5' 3.5" (1.613 m)  Wt 147 lb 3.2 oz (66.769 kg)  BMI 25.66 kg/m2 Well developed and nourished in no acute distress HENT normal Neck supple with JVP-flat Clear Regular rate and rhythm, no murmurs or gallops Abd-soft with active BS No Clubbing cyanosis edema Skin-warm and dry A & Oriented  Grossly normal sensory and motor function\  ECG demonstrates sinus rhythm at 56 Intervals 16/08/44   Assessment and  Plan  1 Atrial Fibrillation:  We'll continue  on current medications. In the event that there are clinical recurrences of note, we'll need to discuss anticoagulation probably done in conjunction with hematology.  I suggested that she have recurrent atrial fibrillation she could take 200 mg of flecainide on a one-time basis.  We'll see her again in one year  Laboratories are normal 12/14

## 2014-08-25 NOTE — Patient Instructions (Signed)
Your physician recommends that you continue on your current medications as directed. Please refer to the Current Medication list given to you today.  A chest x-ray takes a picture of the organs and structures inside the chest, including the heart, lungs, and blood vessels. This test can show several things, including, whether the heart is enlarges; whether fluid is building up in the lungs; and whether pacemaker / defibrillator leads are still in place.  Your physician wants you to follow-up in: 1 year with Dr. Caryl Comes.  You will receive a reminder letter in the mail two months in advance. If you don't receive a letter, please call our office to schedule the follow-up appointment.

## 2014-08-26 ENCOUNTER — Ambulatory Visit
Admission: RE | Admit: 2014-08-26 | Discharge: 2014-08-26 | Disposition: A | Discharge status: Home / Self Care | Payer: Medicare Other | Source: Ambulatory Visit | Attending: Internal Medicine | Admitting: Internal Medicine

## 2014-08-26 DIAGNOSIS — R0689 Other abnormalities of breathing: Secondary | ICD-10-CM

## 2014-08-27 ENCOUNTER — Other Ambulatory Visit: Payer: Self-pay | Admitting: *Deleted

## 2014-09-29 ENCOUNTER — Other Ambulatory Visit (INDEPENDENT_AMBULATORY_CARE_PROVIDER_SITE_OTHER): Payer: Medicare Other

## 2014-09-29 LAB — CBC WITH DIFFERENTIAL/PLATELET
BASOS PCT: 1 % (ref 0–1)
Basophils Absolute: 0.1 10*3/uL (ref 0.0–0.1)
EOS ABS: 0.3 10*3/uL (ref 0.0–0.7)
Eosinophils Relative: 6 % — ABNORMAL HIGH (ref 0–5)
HEMATOCRIT: 38.4 % (ref 36.0–46.0)
Hemoglobin: 12.7 g/dL (ref 12.0–15.0)
Lymphocytes Relative: 26 % (ref 12–46)
Lymphs Abs: 1.3 10*3/uL (ref 0.7–4.0)
MCH: 32.6 pg (ref 26.0–34.0)
MCHC: 33.1 g/dL (ref 30.0–36.0)
MCV: 98.7 fL (ref 78.0–100.0)
MONOS PCT: 10 % (ref 3–12)
MPV: 12.8 fL — AB (ref 8.6–12.4)
Monocytes Absolute: 0.5 10*3/uL (ref 0.1–1.0)
NEUTROS ABS: 2.9 10*3/uL (ref 1.7–7.7)
NEUTROS PCT: 57 % (ref 43–77)
Platelets: 227 10*3/uL (ref 150–400)
RBC: 3.89 MIL/uL (ref 3.87–5.11)
RDW: 14 % (ref 11.5–15.5)
WBC: 5.1 10*3/uL (ref 4.0–10.5)

## 2014-09-29 LAB — COMPREHENSIVE METABOLIC PANEL
ALBUMIN: 4.1 g/dL (ref 3.5–5.2)
ALK PHOS: 60 U/L (ref 39–117)
ALT: 18 U/L (ref 0–35)
AST: 21 U/L (ref 0–37)
BILIRUBIN TOTAL: 0.6 mg/dL (ref 0.2–1.2)
BUN: 12 mg/dL (ref 6–23)
CALCIUM: 9.1 mg/dL (ref 8.4–10.5)
CO2: 26 mEq/L (ref 19–32)
CREATININE: 0.54 mg/dL (ref 0.50–1.10)
Chloride: 104 mEq/L (ref 96–112)
Glucose, Bld: 74 mg/dL (ref 70–99)
Potassium: 4.4 mEq/L (ref 3.5–5.3)
Sodium: 139 mEq/L (ref 135–145)
Total Protein: 6.6 g/dL (ref 6.0–8.3)

## 2014-09-29 LAB — FERRITIN: FERRITIN: 20 ng/mL (ref 10–291)

## 2014-09-30 ENCOUNTER — Telehealth: Payer: Self-pay | Admitting: *Deleted

## 2014-09-30 NOTE — Telephone Encounter (Signed)
-----   Message from Annia Belt, MD sent at 09/29/2014  8:43 PM EDT ----- Call pt  Ferritin remains low at 20  Nothing needs to be done

## 2014-09-30 NOTE — Telephone Encounter (Signed)
Pt called - no answer. Left message Ferritin low @ 20 and nothing needs to be done per Dr Beryle Beams. And to call if she has any questions.

## 2014-10-06 ENCOUNTER — Encounter: Payer: Self-pay | Admitting: Oncology

## 2014-10-06 ENCOUNTER — Ambulatory Visit (INDEPENDENT_AMBULATORY_CARE_PROVIDER_SITE_OTHER): Payer: Medicare Other | Admitting: Oncology

## 2014-10-06 ENCOUNTER — Other Ambulatory Visit: Payer: Medicare Other

## 2014-10-06 ENCOUNTER — Encounter: Payer: Medicare Other | Admitting: Oncology

## 2014-10-06 NOTE — Patient Instructions (Signed)
Lab 10/06/15 MD visit 1-2 weeks after lab

## 2014-10-06 NOTE — Progress Notes (Signed)
Patient ID: [name], female   DOB: 1941/02/23, 74 y.o.   MRN: [id number] Hematology and Oncology Follow Up Visit  [name] [id number] August 21, 1940 74 y.o. 10/06/2014 12:33 PM   Principle Diagnosis: Encounter Diagnosis  Name Primary?  . Hereditary hemochromatosis Yes   clinical summary: 74 year old woman who is a homozygote for the C282Y hemachromatosis gene. She has an interesting family history since the gene is dominant but variably expressed in different family members. Her brother had clinical hemachromatosis and developed end-stage liver disease requiring transplant and ultimately died of complications of his disease. Another brother is affected and on a phlebotomy program. A third brother is not affected. All 3 of her daughters are affected. All are homozygotes.  2 of  3 have not required phlebotomies despite homozygous status. Mrs. Fernicola has only required a rare phlebotomy since her ferritin levels run low. She initially required phlebotomy December 2010 at which time the ferritin was 248. She was started on monthly phlebotomy program. Ferritin was down to 58 by 02/01/2010. Interval between phlebotomies was decreased every 3 months. In October 2012 the ferritin was down to 10. She was last phlebotomized in January 2013. Ferritin 79 consistently low since that time and she has not required subsequent phlebotomy.    Interim History:   She is doing very well. She has had no interim medical problems. She remains active. She follows up with primary care physician, gynecology and cardiology (chronic atrial fibrillation). At time of most recent visit with cardiology 08/25/2014, EKG showed sinus rhythm.  Medications: reviewed  Allergies: No Known Allergies  Review of Systems: See history of present illness. She is up-to-date on all of her health maintenance exams including pelvic and Pap smears, colonoscopies, and mammograms. Remaining ROS negative:   Physical Exam: Blood  pressure 112/56, pulse 65, temperature 97.7 F (36.5 C), temperature source Oral, height 5\' 4"  (1.626 m), weight 149 lb 9.6 oz (67.858 kg), SpO2 98 %. Wt Readings from Last 3 Encounters:  10/06/14 149 lb 9.6 oz (67.858 kg)  08/25/14 147 lb 3.2 oz (66.769 kg)  10/14/13 146 lb 12.8 oz (66.588 kg)     General appearance: Thin well-nourished Caucasian woman HENNT: Pharynx no erythema, exudate, mass, or ulcer. No thyromegaly or thyroid nodules Lymph nodes: No cervical, supraclavicular, or axillary lymphadenopathy Breasts:  Lungs: Clear to auscultation, resonant to percussion throughout Heart: Regular rhythm, no murmur, no gallop, no rub, no click, no edema Abdomen: Soft, nontender, normal bowel sounds, no mass, no organomegaly Extremities: No edema, no calf tenderness Musculoskeletal: no joint deformities GU:  Vascular: Carotid pulses 2+, no bruits,  Neurologic: Alert, oriented, PERRLA, , cranial nerves grossly normal, motor strength 5 over 5, reflexes 1+ symmetric, upper body coordination normal, gait normal, Skin: No rash or ecchymosis  Lab Results: CBC W/Diff    Component Value Date/Time   WBC 5.1 09/29/2014 1104   WBC 5.0 09/17/2012 1136   RBC 3.89 09/29/2014 1104   RBC 3.73 09/17/2012 1136   HGB 12.7 09/29/2014 1104   HGB 12.3 09/17/2012 1136   HCT 38.4 09/29/2014 1104   HCT 36.3 09/17/2012 1136   PLT 227 09/29/2014 1104   PLT 207 09/17/2012 1136   MCV 98.7 09/29/2014 1104   MCV 97.2 09/17/2012 1136   MCH 32.6 09/29/2014 1104   MCH 32.8 09/17/2012 1136   MCHC 33.1 09/29/2014 1104   MCHC 33.8 09/17/2012 1136   RDW 14.0 09/29/2014 1104   RDW 15.0* 09/17/2012 1136   LYMPHSABS 1.3 09/29/2014  1104   LYMPHSABS 1.5 09/17/2012 1136   MONOABS 0.5 09/29/2014 1104   MONOABS 0.4 09/17/2012 1136   EOSABS 0.3 09/29/2014 1104   EOSABS 0.2 09/17/2012 1136   BASOSABS 0.1 09/29/2014 1104   BASOSABS 0.1 09/17/2012 1136     Chemistry      Component Value Date/Time   NA 139  09/29/2014 1104   NA 140 09/17/2012 1136   K 4.4 09/29/2014 1104   K 3.8 09/17/2012 1136   CL 104 09/29/2014 1104   CL 105 09/17/2012 1136   CO2 26 09/29/2014 1104   CO2 26 09/17/2012 1136   BUN 12 09/29/2014 1104   BUN 14.3 09/17/2012 1136   CREATININE 0.54 09/29/2014 1104   CREATININE 0.5 05/28/2013 1051   CREATININE 0.7 09/17/2012 1136      Component Value Date/Time   CALCIUM 9.1 09/29/2014 1104   CALCIUM 9.1 09/17/2012 1136   ALKPHOS 60 09/29/2014 1104   ALKPHOS 61 09/17/2012 1136   AST 21 09/29/2014 1104   AST 23 09/17/2012 1136   ALT 18 09/29/2014 1104   ALT 21 09/17/2012 1136   BILITOT 0.6 09/29/2014 1104   BILITOT 0.37 09/17/2012 1136    Ferritin: 20   Radiological Studies: No results found.  Impression:  #1. Homozygous status for the C282Y hemachromatosis gene. Ferritin staying low off phlebotomy.  She is now on annual follow-up.  #2. History of atrial fibrillation currently in sinus rhythm on current medications. She has no contraindication to anticoagulation if it is needed.  #3. Hypothyroid on replacement  CC: Patient Care Team: Aura Dials, PA-C as PCP - General (Physician Assistant)   Annia Belt, MD 4/11/201612:33 PM

## 2014-10-07 ENCOUNTER — Other Ambulatory Visit: Payer: Self-pay | Admitting: Internal Medicine

## 2015-07-28 ENCOUNTER — Other Ambulatory Visit: Payer: Self-pay | Admitting: Internal Medicine

## 2015-10-06 ENCOUNTER — Other Ambulatory Visit (INDEPENDENT_AMBULATORY_CARE_PROVIDER_SITE_OTHER): Payer: Medicare Other

## 2015-10-07 LAB — CBC WITH DIFFERENTIAL/PLATELET
Basophils Absolute: 0.1 10*3/uL (ref 0.0–0.2)
Basos: 1 %
EOS (ABSOLUTE): 0.3 10*3/uL (ref 0.0–0.4)
EOS: 5 %
HEMATOCRIT: 36.5 % (ref 34.0–46.6)
HEMOGLOBIN: 12.1 g/dL (ref 11.1–15.9)
IMMATURE GRANULOCYTES: 0 %
Immature Grans (Abs): 0 10*3/uL (ref 0.0–0.1)
LYMPHS: 27 %
Lymphocytes Absolute: 1.3 10*3/uL (ref 0.7–3.1)
MCH: 32.4 pg (ref 26.6–33.0)
MCHC: 33.2 g/dL (ref 31.5–35.7)
MCV: 98 fL — AB (ref 79–97)
Monocytes Absolute: 0.5 10*3/uL (ref 0.1–0.9)
Monocytes: 10 %
NEUTROS ABS: 2.8 10*3/uL (ref 1.4–7.0)
NEUTROS PCT: 57 %
Platelets: 228 10*3/uL (ref 150–379)
RBC: 3.73 x10E6/uL — ABNORMAL LOW (ref 3.77–5.28)
RDW: 14.6 % (ref 12.3–15.4)
WBC: 5 10*3/uL (ref 3.4–10.8)

## 2015-10-07 LAB — CMP14 + ANION GAP
ALBUMIN: 4.2 g/dL (ref 3.5–4.8)
ALT: 14 IU/L (ref 0–32)
ANION GAP: 19 mmol/L — AB (ref 10.0–18.0)
AST: 21 IU/L (ref 0–40)
Albumin/Globulin Ratio: 1.7 (ref 1.2–2.2)
Alkaline Phosphatase: 58 IU/L (ref 39–117)
BUN / CREAT RATIO: 25 (ref 12–28)
BUN: 16 mg/dL (ref 8–27)
Bilirubin Total: 0.2 mg/dL (ref 0.0–1.2)
CALCIUM: 9.4 mg/dL (ref 8.7–10.3)
CO2: 22 mmol/L (ref 18–29)
CREATININE: 0.64 mg/dL (ref 0.57–1.00)
Chloride: 102 mmol/L (ref 96–106)
GFR, EST AFRICAN AMERICAN: 102 mL/min/{1.73_m2} (ref >59)
GFR, EST NON AFRICAN AMERICAN: 88 mL/min/{1.73_m2} (ref >59)
GLOBULIN, TOTAL: 2.5 g/dL (ref 1.5–4.5)
Glucose: 88 mg/dL (ref 65–99)
Potassium: 4.4 mmol/L (ref 3.5–5.2)
SODIUM: 143 mmol/L (ref 134–144)
TOTAL PROTEIN: 6.7 g/dL (ref 6.0–8.5)

## 2015-10-07 LAB — FERRITIN: Ferritin: 15 ng/mL (ref 15–150)

## 2015-10-09 ENCOUNTER — Encounter: Payer: Self-pay | Admitting: Internal Medicine

## 2015-10-09 ENCOUNTER — Ambulatory Visit (INDEPENDENT_AMBULATORY_CARE_PROVIDER_SITE_OTHER): Payer: Medicare Other | Admitting: Internal Medicine

## 2015-10-09 VITALS — BP 108/70 | HR 59 | Ht 64.0 in | Wt 145.4 lb

## 2015-10-09 DIAGNOSIS — I480 Paroxysmal atrial fibrillation: Secondary | ICD-10-CM

## 2015-10-09 NOTE — Patient Instructions (Signed)
Medication Instructions: - Your physician recommends that you continue on your current medications as directed. Please refer to the Current Medication list given to you today.  Labwork: - none  Procedures/Testing: - none  Follow-Up: - Your physician wants you to follow-up in: 1 year with Dr. Caryl Comes. You will receive a reminder letter in the mail two months in advance. If you don't receive a letter, please call our office to schedule the follow-up appointment.  Any Additional Special Instructions Will Be Listed Below (If Applicable). - Please call Heather back at [phone] if you decide which blood thinner you would like to start.    If you need a refill on your cardiac medications before your next appointment, please call your pharmacy.

## 2015-10-09 NOTE — Progress Notes (Signed)
Patient Care Team: Aura Dials, PA-C as PCP - General (Physician Assistant) Annia Belt, MD as Consulting Physician (Hematology) Deboraha Sprang, MD as Consulting Physician (Cardiology)   HPI  [name] is a 75 y.o. female seen in followup for atrial fibrillation for which he takes flecainide 50 mg twice daily. This occurs  in the context of hemochromatosis.  She is followed for this by Dr. Beryle Beams.    She was seen in the emergency room 11/14 for chest pain and palpitations. Per EMS note, she was in atrial fibrillation on their arrival; was in sinus by the time she was seen in the ER   No intercurrent   Her CHADS-VASc score is 2; age/gender.  She is not on anticoagulation although Dr. Timoteo Expose note 4/16 outlines that he sees no problems with using anticoagulation is indicated     She also has been diagnosed as having hemochromatosis identified by the C282Y gene mutation for which she is homozygous, and by , piss poor luck, with her heterozygous spouse, she gave birth to three daughters all of whom are homozygous.   Normal nuclear study 4/15  Past Medical History  Diagnosis Date  . Atrial fibrillation ([place])     a. Flecainide therapy;  b. event monitor 4/12  . Hemochromatosis     indentified by the C282Y gene mutation; Dr. Beryle Beams  . History of syncope   . Hypothyroidism   . Depression with anxiety   . GERD (gastroesophageal reflux disease)   . Chest pain     a. GXT myoview 4/12: no isch., EF 86%;   b. echo 4/12: EF 55-65%, grade 1 diast dysfxn, LAE    Past Surgical History  Procedure Laterality Date  . Total abdominal hysterectomy    . Tonsillectomy      Current Outpatient Prescriptions  Medication Sig Dispense Refill  . Biotin 1000 MCG tablet Take 1,000 mcg by mouth daily.    . Calcium Carbonate 1500 MG TABS Take 1 tablet by mouth 3 (three) times a week.    . cholecalciferol (VITAMIN D) 1000 UNITS tablet Take 1,000 Units by mouth 3  (three) times a week.    . cyclobenzaprine (FLEXERIL) 10 MG tablet as needed.    . flecainide (TAMBOCOR) 100 MG tablet Take one-half tablet by  mouth in the morning and  one-half tablet by mouth in the evening 90 tablet 3  . folic acid (FOLVITE) 1 MG tablet Take 1 mg by mouth daily.      Marland Kitchen levothyroxine (SYNTHROID, LEVOTHROID) 112 MCG tablet Take 112 mcg by mouth daily.      . metoprolol succinate (TOPROL-XL) 25 MG 24 hr tablet Take 1 tablet by mouth  daily 90 tablet 3  . omeprazole (PRILOSEC) 40 MG capsule daily.    Baird Cancer ophthalmic ointment as needed.    Marland Kitchen VITAMIN B1-B12 IM Inject 1,000 mcg into the muscle every 30 (thirty) days.     Marland Kitchen VITAMIN E PO Take 1 tablet by mouth 3 (three) times a week.    . zolpidem (AMBIEN) 10 MG tablet as needed.     No current facility-administered medications for this visit.    No Known Allergies  Review of Systems negative except from HPI and PMH  Physical Exam BP 108/70 mmHg  Pulse 59  Ht 5\' 4"  (1.626 m)  Wt 145 lb 6.4 oz (65.953 kg)  BMI 24.95 kg/m2 Well developed and nourished in no acute distress HENT normal Neck  supple with JVP-flat Clear Regular rate and rhythm, no murmurs or gallops Abd-soft with active BS No Clubbing cyanosis 1. Syncope + edema Skin-warm and dry A & Oriented  Grossly normal sensory and motor function\  ECG demonstrates  Assessment and  Plan  1 Atrial Fibrillation:     I suggested that she have recurrent atrial fibrillation she could take 200 mg of flecainide on a one-time basis.  We'll see her again in 9 months  Laboratories are normal 12/14

## 2015-10-13 ENCOUNTER — Ambulatory Visit (INDEPENDENT_AMBULATORY_CARE_PROVIDER_SITE_OTHER): Payer: Medicare Other | Admitting: Oncology

## 2015-10-13 ENCOUNTER — Encounter: Payer: Self-pay | Admitting: Oncology

## 2015-10-13 DIAGNOSIS — I480 Paroxysmal atrial fibrillation: Secondary | ICD-10-CM | POA: Diagnosis not present

## 2015-10-13 NOTE — Patient Instructions (Signed)
Visit with Dr Darnell Level in 1 year Lab 1-2 weeks before visit

## 2015-10-13 NOTE — Progress Notes (Signed)
Patient ID: [name], female   DOB: June 21, 1941, 75 y.o.   MRN: [id number] Hematology and Oncology Follow Up Visit  Tara YONKO [id number] 1941/05/31 75 y.o. 10/13/2015 1:54 PM   Principle Diagnosis: Encounter Diagnosis  Name Primary?  . Hereditary hemochromatosis ([place]) Yes  Clinical Summary: 75 year old woman who is a homozygote for the C282Y hemachromatosis gene. She has an interesting family history since the gene is dominant but variably expressed in different family members. Her brother had clinical hemachromatosis and developed end-stage liver disease requiring transplant and ultimately died of complications of his disease. Another brother is affected and on a phlebotomy program. A third brother is not affected. All 3 of her daughters are affected. All are homozygotes. 2 of3 have not required phlebotomies despite homozygous status. [name] has only required a rare phlebotomy since her ferritin levels run low. She initially required phlebotomy December 2010 at which time the ferritin was 248. She was started on monthly phlebotomy program. Ferritin was down to 58 by 02/01/2010. Interval between phlebotomies was decreased every 3 months. In October 2012 the ferritin was down to 10. She was last phlebotomized in January 2013. Ferritin  consistently low since that time and she has not required subsequent phlebotomy.   Interim History:  No medical issues since her last visit with me in April 2016. We spent most of our time discussing the risk versus benefit going on long-term anticoagulation in view of her paroxysmal atrial fibrillation. Her rate is well controlled. Every routine cardiogram done for the last 5 years shows that she is in sinus rhythm. She had one emergency room visit for acute onset of chest pain radiating to her jaw on 06/01/2013. Monitor strips on the ambulance showedatrial fibrillation but EKG in the emergency department showed sinus rhythm at that time rate 94.  She had a follow-up office visit with her cardiologist last week on April 14.  Medications: reviewed  Allergies: No Known Allergies  Review of Systems: See interim history Last colonoscopy 7 years ago. Not due for another one for 3 years. No change in bowel habit. Remaining ROS negative:   Physical Exam: Blood pressure 113/55, pulse 56, temperature 97.6 F (36.4 C), temperature source Oral, height 5\' 4"  (1.626 m), weight 146 lb (66.225 kg), SpO2 100 %. Wt Readings from Last 3 Encounters:  10/13/15 146 lb (66.225 kg)  10/09/15 145 lb 6.4 oz (65.953 kg)  10/06/14 149 lb 9.6 oz (67.858 kg)     General appearance: Well-nourished Caucasian woman HENNT: Pharynx no erythema, exudate, mass, or ulcer. No thyromegaly or thyroid nodules Lymph nodes: No cervical, supraclavicular, or axillary lymphadenopathy Breasts:  Lungs: Clear to auscultation, resonant to percussion throughout Heart: Regular rhythm, no murmur, no gallop, no rub, no click, no edema Abdomen: Soft, nontender, normal bowel sounds, no mass, no organomegaly Extremities: No edema, no calf tenderness Musculoskeletal: no joint deformities GU:  Vascular: Carotid pulses 2+, no bruits,  Neurologic: Alert, oriented, PERRLA, optic discs sharp and vessels normal, no hemorrhage or exudate, cranial nerves grossly normal, motor strength 5 over 5, reflexes 1+ symmetric, upper body coordination normal, gait normal, Skin: No rash or ecchymosis  Lab Results: CBC W/Diff    Component Value Date/Time   WBC 5.0 10/06/2015 1104   WBC 5.1 09/29/2014 1104   WBC 5.0 09/17/2012 1136   RBC 3.73* 10/06/2015 1104   RBC 3.89 09/29/2014 1104   RBC 3.73 09/17/2012 1136   HGB 12.7 09/29/2014 1104   HGB 12.3 09/17/2012 1136  HCT 36.5 10/06/2015 1104   HCT 38.4 09/29/2014 1104   HCT 36.3 09/17/2012 1136   PLT 228 10/06/2015 1104   PLT 227 09/29/2014 1104   PLT 207 09/17/2012 1136   MCV 98* 10/06/2015 1104   MCV 98.7 09/29/2014 1104   MCV 97.2  09/17/2012 1136   MCH 32.4 10/06/2015 1104   MCH 32.6 09/29/2014 1104   MCH 32.8 09/17/2012 1136   MCHC 33.2 10/06/2015 1104   MCHC 33.1 09/29/2014 1104   MCHC 33.8 09/17/2012 1136   RDW 14.6 10/06/2015 1104   RDW 14.0 09/29/2014 1104   RDW 15.0* 09/17/2012 1136   LYMPHSABS 1.3 10/06/2015 1104   LYMPHSABS 1.3 09/29/2014 1104   LYMPHSABS 1.5 09/17/2012 1136   MONOABS 0.5 09/29/2014 1104   MONOABS 0.4 09/17/2012 1136   EOSABS 0.3 10/06/2015 1104   EOSABS 0.3 09/29/2014 1104   EOSABS 0.2 09/17/2012 1136   BASOSABS 0.1 10/06/2015 1104   BASOSABS 0.1 09/29/2014 1104   BASOSABS 0.1 09/17/2012 1136     Chemistry      Component Value Date/Time   NA 143 10/06/2015 1104   NA 139 09/29/2014 1104   NA 140 09/17/2012 1136   K 4.4 10/06/2015 1104   K 3.8 09/17/2012 1136   CL 102 10/06/2015 1104   CL 105 09/17/2012 1136   CO2 22 10/06/2015 1104   CO2 26 09/17/2012 1136   BUN 16 10/06/2015 1104   BUN 12 09/29/2014 1104   BUN 14.3 09/17/2012 1136   CREATININE 0.64 10/06/2015 1104   CREATININE 0.54 09/29/2014 1104   CREATININE 0.7 09/17/2012 1136      Component Value Date/Time   CALCIUM 9.4 10/06/2015 1104   CALCIUM 9.1 09/17/2012 1136   ALKPHOS 58 10/06/2015 1104   ALKPHOS 61 09/17/2012 1136   AST 21 10/06/2015 1104   AST 23 09/17/2012 1136   ALT 14 10/06/2015 1104   ALT 21 09/17/2012 1136   BILITOT 0.2 10/06/2015 1104   BILITOT 0.6 09/29/2014 1104   BILITOT 0.37 09/17/2012 1136       Radiological Studies: No results found.  Impression:  #1. Homozygous for the C282Y hemachromatosis gene. She has not required phlebotomy in 4 years. Most recent ferritin was 15 on 10/06/2015. Hemoglobin 13 with MCV 98. Normal liver functions. At this point I am monitoring her labs and clinical status on a annual basis.  #2. Paroxysmal atrial fibrillation We discussed the risk versus benefit of long-term anticoagulation. Although she is in sinus rhythm most of the time. The fact that she  has episodic nature fibrillation still puts her at risk for stroke. We reviewed all of the available anticoagulants and their benefits versus risks. She has normal renal function so this is not an issue and she could take anyone of the 4 drugs approved. 2 of the drugs Pradaxa and Eloquis or twice daily drugs. 2 of the drugs Xarelto and Sayvasa oral once daily. All of them probably have some minor interaction with her Tambocor. Drugs with the least renal clearance would be Eloquis and Xarelto in that order. I gave her printed information about the Xarelto and the Eloquis. I do think it would be very reasonable to put her on long-term anticoagulation and she will call me with her decision on which drug she would prefer to start with.  #3. Hypothyroid on replacement  CC: Patient Care Team: Aura Dials, PA-C as PCP - General (Physician Assistant) Annia Belt, MD as Consulting Physician (Hematology) Revonda Standard  Caryl Comes, MD as Consulting Physician (Cardiology)   Annia Belt, MD 4/18/20171:54 PM

## 2015-10-19 NOTE — Addendum Note (Signed)
Addended by: Freada Bergeron on: 10/19/2015 02:18 PM   Modules accepted: Orders

## 2015-11-25 ENCOUNTER — Telehealth: Payer: Self-pay | Admitting: Internal Medicine

## 2015-11-25 NOTE — Telephone Encounter (Signed)
New Message;  Pt is calling in wanting to know if there are any Pulmonary doctor's he could referred her to for the persisting cough that she has. Please f/u with her.

## 2015-11-25 NOTE — Telephone Encounter (Signed)
Spoke with pt, she is getting the referral from her PCP, she wants to know from dr Caryl Comes who he likes as far as a pulmonary doctor. Will forward for dr klein's review.

## 2015-11-30 NOTE — Telephone Encounter (Signed)
Follow Up  Pt is calling to follow up on prev messages.

## 2015-11-30 NOTE — Telephone Encounter (Signed)
I called and spoke with the patient's husband- the patient is currently out of the house picking up a child, but he knew what the patient was calling about. I advised that Dr. Caryl Comes has had good experiences with Dr. Halford Chessman Dr. Alva/ & Dr. Chase Caller for pulmonary. He will grateful for the call back and will advise the patient.

## 2015-12-14 ENCOUNTER — Other Ambulatory Visit: Payer: Self-pay | Admitting: Internal Medicine

## 2015-12-15 ENCOUNTER — Other Ambulatory Visit: Payer: Self-pay

## 2015-12-15 MED ORDER — FLECAINIDE ACETATE 100 MG PO TABS: ORAL_TABLET | ORAL | Status: DC

## 2016-02-10 ENCOUNTER — Encounter: Payer: Self-pay | Admitting: Internal Medicine

## 2016-02-10 ENCOUNTER — Ambulatory Visit (INDEPENDENT_AMBULATORY_CARE_PROVIDER_SITE_OTHER): Payer: Medicare Other | Admitting: Internal Medicine

## 2016-02-10 VITALS — BP 112/64 | HR 64 | Ht 64.0 in | Wt 143.8 lb

## 2016-02-10 DIAGNOSIS — R05 Cough: Secondary | ICD-10-CM | POA: Diagnosis not present

## 2016-02-10 DIAGNOSIS — J387 Other diseases of larynx: Secondary | ICD-10-CM | POA: Diagnosis not present

## 2016-02-10 DIAGNOSIS — Z87891 Personal history of nicotine dependence: Secondary | ICD-10-CM | POA: Diagnosis not present

## 2016-02-10 DIAGNOSIS — R053 Chronic cough: Secondary | ICD-10-CM | POA: Insufficient documentation

## 2016-02-10 LAB — NITRIC OXIDE: Nitric Oxide: 12

## 2016-02-10 NOTE — Progress Notes (Signed)
Subjective:    Patient ID: [name], female    DOB: 1941/03/13, 75 y.o.   MRN: [id number]  PCP [name],[name], PA-C   HPI  IOV 02/10/2016  Chief Complaint  Patient presents with  . Pulmonary Consult    Pt referred by Dr. Melford Aase for chronic cough x 1 year. Pt states she feels she has a tickle that is causing her dry cough. Pt states she has DOE when climbing stairs. Pt deneis CP/tightness.     75 year old female with hemochromatosis homozygous gene followed by Dr. Beryle Beams (last phlebotomy many years ago and is on serial monitoring) with children and siblings with active disease. She has also atrial fibrillation followed by Dr. Caryl Comes. Reports insidious onset of chronic cough in the last 1 year. It is stable since onset. It fluctuates between mild and severe in severity. It is mostly dry in quality. Mostly present in the daytime but sometimes also wakes her up at night. It is definitely not progressive. It is episodic and present every day. Aggravated by talking sometimes and then the throat feels dry associated with tickle in the throat and also sensation the cough is coming from the upper chest retrosternally and sometimes relieved by drinking water or chewing on a lozenge. Also aggravated by seasonal changes particularly in the spring and the fall which makes her think she has allergies. There is sometimes associated gag. She also reports nonspecific occasional wheezing and associated shortness of breath that is nonspecific but present with exertion and relieved by rest No other clear cut aggravating or relieving factors.   cough associated history  - Medications: She is not on fish oil or ACE inhibitors - Sinus drainage: She does admit to spring allergies. She did have something removed from her hard palate several years ago by ENT does not know details - Acid reflux: She says she is significant acid reflux and is on Prilosec. Without which she'll have significant symptoms -  Pulmonary disease: FenO 12 02/10/2016  and normal. She denies any personal history of asthma or pulmonary fibrosis of COPD or emphysema smoked one pack per day started smoking at age 89 and quit in 1999. Making a 42 pack smoking history. Chest x-ray 08/26/2014 personally visualized is clear    Dr Lorenza Cambridge Reflux Symptom Index (> 13-15 suggestive of LPR cough) 0 -> 5  =  none ->severe problem 02/10/2016   Hoarseness of problem with voice 0  Clearing  Of Throat 2  Excess throat mucus or feeling of post nasal drip 2  Difficulty swallowing food, liquid or tablets 0  Cough after eating or lying down 1  Breathing difficulties or choking episodes 1  Troublesome or annoying cough 2  Sensation of something sticking in throat or lump in throat 0  Heartburn, chest pain, indigestion, or stomach acid coming up 3  TOTAL 11       has a past medical history of Atrial fibrillation ([place]); Chest pain; Depression with anxiety; GERD (gastroesophageal reflux disease); Hemochromatosis; History of syncope; and Hypothyroidism.   reports that she quit smoking about 18 years ago. She has a 42.00 pack-year smoking history. She has never used smokeless tobacco.  Past Surgical History:  Procedure Laterality Date  . MOUTH SURGERY    . SHOULDER ARTHROSCOPY Left   . TOTAL ABDOMINAL HYSTERECTOMY    . VARICOSE VEIN SURGERY      No Known Allergies   There is no immunization history on file for this patient.  Family History  Problem Relation Age of Onset  . Stroke Mother   . Heart disease Father 71  . Hemochromatosis Brother      Current Outpatient Prescriptions:  .  Biotin 1000 MCG tablet, Take 1,000 mcg by mouth daily., Disp: , Rfl:  .  Calcium Carbonate 1500 MG TABS, Take 1 tablet by mouth 3 (three) times a week., Disp: , Rfl:  .  cholecalciferol (VITAMIN D) 1000 UNITS tablet, Take 1,000 Units by mouth 3 (three) times a week., Disp: , Rfl:  .  cyclobenzaprine (FLEXERIL) 10 MG tablet, as needed., Disp:  , Rfl:  .  flecainide (TAMBOCOR) 100 MG tablet, Take one-half tablet by  mouth in the morning and  one-half tablet by mouth in the evening, Disp: 90 tablet, Rfl: 3 .  folic acid (FOLVITE) 1 MG tablet, Take 1 mg by mouth daily.  , Disp: , Rfl:  .  levothyroxine (SYNTHROID, LEVOTHROID) 112 MCG tablet, Take 112 mcg by mouth daily.  , Disp: , Rfl:  .  metoprolol succinate (TOPROL-XL) 25 MG 24 hr tablet, Take 1 tablet by mouth  daily, Disp: 90 tablet, Rfl: 3 .  omeprazole (PRILOSEC) 40 MG capsule, daily., Disp: , Rfl:  .  TOBRADEX ophthalmic ointment, as needed., Disp: , Rfl:  .  VITAMIN B1-B12 IM, Inject 1,000 mcg into the muscle every 30 (thirty) days. , Disp: , Rfl:  .  VITAMIN E PO, Take 1 tablet by mouth 3 (three) times a week., Disp: , Rfl:  .  zolpidem (AMBIEN) 10 MG tablet, as needed., Disp: , Rfl:      Review of Systems  Constitutional: Negative for fever and unexpected weight change.  HENT: Positive for congestion. Negative for dental problem, ear pain, nosebleeds, postnasal drip, rhinorrhea, sinus pressure, sneezing, sore throat and trouble swallowing.   Eyes: Negative for redness and itching.  Respiratory: Positive for cough. Negative for chest tightness, shortness of breath and wheezing.   Cardiovascular: Negative for palpitations and leg swelling.  Gastrointestinal: Negative for nausea and vomiting.  Genitourinary: Negative for dysuria.  Musculoskeletal: Negative for joint swelling.  Skin: Negative for rash.  Neurological: Negative for headaches.  Hematological: Does not bruise/bleed easily.  Psychiatric/Behavioral: Negative for dysphoric mood. The patient is not nervous/anxious.        Objective:   Physical Exam  Constitutional: She is oriented to person, place, and time. She appears well-developed and well-nourished. No distress.  HENT:  Head: Normocephalic and atraumatic.  Right Ear: External ear normal.  Left Ear: External ear normal.  Mouth/Throat: Oropharynx is  clear and moist. No oropharyngeal exudate.  Laryngeal quality to cough Intermittent clearing  Eyes: Conjunctivae and EOM are normal. Pupils are equal, round, and reactive to light. Right eye exhibits no discharge. Left eye exhibits no discharge. No scleral icterus.  Neck: Normal range of motion. Neck supple. No JVD present. No tracheal deviation present. No thyromegaly present.  Cardiovascular: Normal rate, regular rhythm, normal heart sounds and intact distal pulses.  Exam reveals no gallop and no friction rub.   No murmur heard. Pulmonary/Chest: Effort normal and breath sounds normal. No respiratory distress. She has no wheezes. She has no rales. She exhibits no tenderness.  I'm not sure if she has right-sided basal crackles  Abdominal: Soft. Bowel sounds are normal. She exhibits no distension and no mass. There is no tenderness. There is no rebound and no guarding.  Musculoskeletal: Normal range of motion. She exhibits no edema or tenderness.  Lymphadenopathy:    She has no  cervical adenopathy.  Neurological: She is alert and oriented to person, place, and time. She has normal reflexes. No cranial nerve deficit. She exhibits normal muscle tone. Coordination normal.  Skin: Skin is warm and dry. No rash noted. She is not diaphoretic. No erythema. No pallor.  Psychiatric: She has a normal mood and affect. Her behavior is normal. Judgment and thought content normal.  Vitals reviewed.  Vitals:   02/10/16 0920  BP: 112/64  Pulse: 64  SpO2: 99%  Weight: 143 lb 12.8 oz (65.2 kg)  Height: 5\' 4"  (1.626 m)          Assessment & Plan:     ICD-9-CM ICD-10-CM   1. Chronic cough 786.2 R05 CT Chest High Resolution     Pulmonary function test  2. Irritable larynx 478.79 J38.7 CT Chest High Resolution     Pulmonary function test  3. Stopped smoking with greater than 40 pack year history V15.82 Z87.891 CT Chest High Resolution     Pulmonary function test    Differential diagnosis is broad.  Eosinophilic Asthma is a very low probability based on normal exhaled nitric oxide. I think sinus drainage, acid reflux or playing a role. Given her smoking history intrinsic parenchymal lung disease such as COPD or interstitial lung disease had to be ruled out. Therefore we'll get a pulmonary function test and a high-resolution CT chest. Of note the CT chest can also service lung cancer screening given her smoking history and age of 77. She does seem to have an element of cough neuropathy and irritable larynx syndrome. 2 address this she would need gabapentin along with voice rehabilitation. At this point in time she does not want off for this approach because of pharmacology. We therefore don't pulmonary function test and high resolution CT chest and regroup and try to see if he can address any abnormalities.  She verbalized understanding and agreement with the plan    Dr. Brand Males, M.D., [facility].C.P Pulmonary and Critical Care Medicine Staff Physician [place] [facility] Pager: [phone], If no answer or between  15:00h - 7:00h: call [phone]  02/10/2016 9:55 AM

## 2016-02-10 NOTE — Addendum Note (Signed)
Addended by: Collier Salina on: 02/10/2016 02:37 PM   Modules accepted: Orders

## 2016-02-10 NOTE — Patient Instructions (Addendum)
ICD-9-CM ICD-10-CM   1. Chronic cough 786.2 R05   2. Irritable larynx 478.79 J38.7   3. Stopped smoking with greater than 40 pack year history V15.82 Z87.891    Do full PFT Do HRCT    Return to see me or my NP next few weeks after completion of above

## 2016-02-12 ENCOUNTER — Encounter (INDEPENDENT_AMBULATORY_CARE_PROVIDER_SITE_OTHER): Payer: Medicare Other | Admitting: Internal Medicine

## 2016-02-12 DIAGNOSIS — J387 Other diseases of larynx: Secondary | ICD-10-CM

## 2016-02-12 DIAGNOSIS — R05 Cough: Secondary | ICD-10-CM | POA: Diagnosis not present

## 2016-02-12 DIAGNOSIS — R053 Chronic cough: Secondary | ICD-10-CM

## 2016-02-12 DIAGNOSIS — Z87891 Personal history of nicotine dependence: Secondary | ICD-10-CM

## 2016-02-12 LAB — PULMONARY FUNCTION TEST
DL/VA % PRED: 63 %
DL/VA: 3.03 ml/min/mmHg/L
DLCO COR % PRED: 60 %
DLCO cor: 14.54 ml/min/mmHg
DLCO unc % pred: 58 %
DLCO unc: 14.07 ml/min/mmHg
FEF 25-75 POST: 1.57 L/s
FEF 25-75 Pre: 0.95 L/sec
FEF2575-%Change-Post: 64 %
FEF2575-%Pred-Post: 94 %
FEF2575-%Pred-Pre: 57 %
FEV1-%CHANGE-POST: 11 %
FEV1-%Pred-Post: 102 %
FEV1-%Pred-Pre: 91 %
FEV1-Post: 2.15 L
FEV1-Pre: 1.92 L
FEV1FVC-%Change-Post: 4 %
FEV1FVC-%PRED-PRE: 90 %
FEV6-%Change-Post: 7 %
FEV6-%Pred-Post: 112 %
FEV6-%Pred-Pre: 104 %
FEV6-POST: 2.99 L
FEV6-Pre: 2.79 L
FEV6FVC-%Change-Post: 0 %
FEV6FVC-%PRED-PRE: 103 %
FEV6FVC-%Pred-Post: 103 %
FVC-%CHANGE-POST: 6 %
FVC-%PRED-PRE: 101 %
FVC-%Pred-Post: 108 %
FVC-POST: 3.05 L
FVC-PRE: 2.85 L
POST FEV1/FVC RATIO: 71 %
PRE FEV6/FVC RATIO: 98 %
Post FEV6/FVC ratio: 98 %
Pre FEV1/FVC ratio: 68 %
RV % PRED: 106 %
RV: 2.41 L
TLC % pred: 115 %
TLC: 5.77 L

## 2016-02-16 ENCOUNTER — Ambulatory Visit (INDEPENDENT_AMBULATORY_CARE_PROVIDER_SITE_OTHER)
Admission: RE | Admit: 2016-02-16 | Discharge: 2016-02-16 | Disposition: A | Discharge status: Home / Self Care | Payer: Medicare Other | Source: Ambulatory Visit | Attending: Internal Medicine | Admitting: Internal Medicine

## 2016-02-16 DIAGNOSIS — R05 Cough: Secondary | ICD-10-CM | POA: Diagnosis not present

## 2016-02-16 DIAGNOSIS — J387 Other diseases of larynx: Secondary | ICD-10-CM | POA: Diagnosis not present

## 2016-02-16 DIAGNOSIS — Z87891 Personal history of nicotine dependence: Secondary | ICD-10-CM | POA: Diagnosis not present

## 2016-02-16 DIAGNOSIS — R053 Chronic cough: Secondary | ICD-10-CM

## 2016-02-18 ENCOUNTER — Other Ambulatory Visit: Payer: Medicare Other

## 2016-02-18 ENCOUNTER — Encounter: Payer: Self-pay | Admitting: Internal Medicine

## 2016-02-18 ENCOUNTER — Ambulatory Visit (INDEPENDENT_AMBULATORY_CARE_PROVIDER_SITE_OTHER): Payer: Medicare Other | Admitting: Internal Medicine

## 2016-02-18 VITALS — BP 104/64 | HR 68 | Ht 64.0 in | Wt 143.0 lb

## 2016-02-18 DIAGNOSIS — R918 Other nonspecific abnormal finding of lung field: Secondary | ICD-10-CM

## 2016-02-18 DIAGNOSIS — J849 Interstitial pulmonary disease, unspecified: Secondary | ICD-10-CM

## 2016-02-18 DIAGNOSIS — J439 Emphysema, unspecified: Secondary | ICD-10-CM

## 2016-02-18 DIAGNOSIS — Z87891 Personal history of nicotine dependence: Secondary | ICD-10-CM

## 2016-02-18 DIAGNOSIS — R05 Cough: Secondary | ICD-10-CM | POA: Diagnosis not present

## 2016-02-18 DIAGNOSIS — J387 Other diseases of larynx: Secondary | ICD-10-CM | POA: Diagnosis not present

## 2016-02-18 DIAGNOSIS — R053 Chronic cough: Secondary | ICD-10-CM

## 2016-02-18 MED ORDER — UMECLIDINIUM BROMIDE 62.5 MCG/INH IN AEPB: 1.0000 | INHALATION_SPRAY | Freq: Every day | RESPIRATORY_TRACT | 5 refills | Status: DC

## 2016-02-18 NOTE — Patient Instructions (Addendum)
Chronic cough - cough can be explained by any of the below issues  Stopped smoking with greater than 40 pack year history - continue remission  Irritable larynx - address this over time depending on residual cough after treatment with inhalers  Pulmonary emphysema, unspecified emphysema type (HCC) - start incruse daily  Multiple lung nodules on CT with smoking history - repeat HRCT chest without contrast in 4 months  ILD (interstitial lung disease) (HCC) - Check Serum:  ANA,  RF, anti-CCP, ssA, ssB, scl-70, - will call with results  - will monitor progress as we do followup CT for lung nodules - any discussion on biopsy based on progress of this on CT and your symptoms

## 2016-02-18 NOTE — Progress Notes (Signed)
Subjective:     Patient ID: [name], female   DOB: 1940-12-21, 75 y.o.   MRN: [id number]   HPI   PCP [name],[name], PA-C   HPI  IOV 02/10/2016  Chief Complaint  Patient presents with  . Pulmonary Consult    Pt referred by Dr. Melford Aase for chronic cough x 1 year. Pt states she feels she has a tickle that is causing her dry cough. Pt states she has DOE when climbing stairs. Pt deneis CP/tightness.     75 year old female with hemochromatosis homozygous gene followed by Dr. Beryle Beams (last phlebotomy many years ago and is on serial monitoring) with children and siblings with active disease. She has also atrial fibrillation followed by Dr. Caryl Comes. Reports insidious onset of chronic cough in the last 1 year. It is stable since onset. It fluctuates between mild and severe in severity. It is mostly dry in quality. Mostly present in the daytime but sometimes also wakes her up at night. It is definitely not progressive. It is episodic and present every day. Aggravated by talking sometimes and then the throat feels dry associated with tickle in the throat and also sensation the cough is coming from the upper chest retrosternally and sometimes relieved by drinking water or chewing on a lozenge. Also aggravated by seasonal changes particularly in the spring and the fall which makes her think she has allergies. There is sometimes associated gag. She also reports nonspecific occasional wheezing and associated shortness of breath that is nonspecific but present with exertion and relieved by rest No other clear cut aggravating or relieving factors.   cough associated history  - Medications: She is not on fish oil or ACE inhibitors - Sinus drainage: She does admit to spring allergies. She did have something removed from her hard palate several years ago by ENT does not know details - Acid reflux: She says she is significant acid reflux and is on Prilosec. Without which she'll have significant  symptoms - Pulmonary disease: FenO 12 02/10/2016  and normal. She denies any personal history of asthma or pulmonary fibrosis of COPD or emphysema smoked one pack per day started smoking at age 53 and quit in 1999. Making a 42 pack smoking history. Chest x-ray 08/26/2014 personally visualized is clear - Tobacco :  reports that she quit smoking about 18 years ago. She has a 42.00 pack-year smoking history. She has never used smokeless tobacco.       OV 02/18/2016  Chief Complaint  Patient presents with  . Follow-up    Pt here after PFT and HRCT. Pt denies changes in SOB and cough. Pt denies any new complaints at this time.    Follow-up chronic cough. This visit is to follow-up on test results of function test and high resolution CT chest which are described below9 results show isolated reduction in diffusion capacity which can be explained by possible early ILD and also emphysema. There are also new findings of nodule 6 mm bilaterally on lower lobes. She prefers a very cautious approach to this evaluation.     Pulmonary function test 02/12/2016 - FVC 2.8 cm/101%, FEV1 1.9 L/91%. Ratio 60/90%. Total lung capacity 115%. DLCO reduced at 14.5/60%. She is isolated reduction in diffusion capacity    HRCT chest 02/16/16 IMPRESSION: 1. Mild patchy subpleural reticulation in both lungs with a basilar predominance. No significant traction bronchiectasis. No frank honeycombing. Findings could represent an interstitial lung disease such as nonspecific interstitial pneumonia (NSIP), with early usual interstitial pneumonia (UIP)  not excluded. A follow-up high-resolution chest CT study in 12 months is recommended to assess temporal pattern stability. 2. Bilateral lower lobe solid pulmonary nodules, largest 6 mm. Non-contrast chest CT at 3-6 months is recommended. If the nodules are stable at time of repeat CT, then future CT at 18-24 months (from today's scan) is considered optional for low-risk  patients, but is recommended for high-risk patients. This recommendation follows the consensus statement: Guidelines for Management of Incidental Pulmonary Nodules Detected on CT Images:From the Fleischner Society 2017; published online before print (10.1148/radiol.IJ:2314499). 3. Additional findings include mild-to-moderate centrilobular emphysema, aortic atherosclerosis and 2 vessel coronary atherosclerosis.   Electronically Signed   By: Ilona Sorrel M.D.   On: 02/16/2016 14:14    Dr Lorenza Cambridge Reflux Symptom Index (> 13-15 suggestive of LPR cough)  02/10/2016   Hoarseness of problem with voice 0  Clearing  Of Throat 2  Excess throat mucus or feeling of post nasal drip 2  Difficulty swallowing food, liquid or tablets 0  Cough after eating or lying down 1  Breathing difficulties or choking episodes 1  Troublesome or annoying cough 2  Sensation of something sticking in throat or lump in throat 0  Heartburn, chest pain, indigestion, or stomach acid coming up 3  TOTAL 11      has a past medical history of Atrial fibrillation ([place]); Chest pain; Depression with anxiety; GERD (gastroesophageal reflux disease); Hemochromatosis; History of syncope; and Hypothyroidism.   reports that she quit smoking about 18 years ago. She has a 42.00 pack-year smoking history. She has never used smokeless tobacco.  Past Surgical History:  Procedure Laterality Date  . MOUTH SURGERY    . SHOULDER ARTHROSCOPY Left   . TOTAL ABDOMINAL HYSTERECTOMY    . VARICOSE VEIN SURGERY      No Known Allergies   There is no immunization history on file for this patient.  Family History  Problem Relation Age of Onset  . Stroke Mother   . Heart disease Father 2  . Hemochromatosis Brother      Current Outpatient Prescriptions:  .  Biotin 1000 MCG tablet, Take 1,000 mcg by mouth daily., Disp: , Rfl:  .  Calcium Carbonate 1500 MG TABS, Take 1 tablet by mouth 3 (three) times a week., Disp: , Rfl:  .   cholecalciferol (VITAMIN D) 1000 UNITS tablet, Take 1,000 Units by mouth 3 (three) times a week., Disp: , Rfl:  .  cyclobenzaprine (FLEXERIL) 10 MG tablet, as needed., Disp: , Rfl:  .  flecainide (TAMBOCOR) 100 MG tablet, Take one-half tablet by  mouth in the morning and  one-half tablet by mouth in the evening, Disp: 90 tablet, Rfl: 3 .  folic acid (FOLVITE) 1 MG tablet, Take 1 mg by mouth daily.  , Disp: , Rfl:  .  levothyroxine (SYNTHROID, LEVOTHROID) 112 MCG tablet, Take 112 mcg by mouth daily.  , Disp: , Rfl:  .  metoprolol succinate (TOPROL-XL) 25 MG 24 hr tablet, Take 1 tablet by mouth  daily, Disp: 90 tablet, Rfl: 3 .  omeprazole (PRILOSEC) 40 MG capsule, daily., Disp: , Rfl:  .  TOBRADEX ophthalmic ointment, as needed., Disp: , Rfl:  .  VITAMIN B1-B12 IM, Inject 1,000 mcg into the muscle every 30 (thirty) days. , Disp: , Rfl:  .  VITAMIN E PO, Take 1 tablet by mouth 3 (three) times a week., Disp: , Rfl:  .  zolpidem (AMBIEN) 10 MG tablet, as needed., Disp: , Rfl:  Review of Systems     Objective:   Physical Exam  Vitals:   02/18/16 1046  BP: 104/64  Pulse: 68  SpO2: 97%  Weight: 143 lb (64.9 kg)  Height: 5\' 4"  (1.626 m)   discussion only visit    Assessment:       ICD-9-CM ICD-10-CM   1. Chronic cough 786.2 R05   2. Stopped smoking with greater than 40 pack year history V15.82 Z87.891   3. Irritable larynx 478.79 J38.7   4. Pulmonary emphysema, unspecified emphysema type ([place]) 492.8 J43.9   5. Multiple lung nodules on CT 793.19 R91.8   6. ILD (interstitial lung disease) ([place]) 515 J84.9        Plan:     Chronic cough - cough can be explained by any of the below issues  Stopped smoking with greater than 40 pack year history - continue remission  Irritable larynx - address this over time depending on residual cough after treatment with inhalers  Pulmonary emphysema, unspecified emphysema type ([place]) - start incruse with next daily  Multiple lung nodules  on CT with smoking history - repeat HRCT chest without contrast in 4 months  ILD (interstitial lung disease) (HCC) - Check Serum:  ANA,  RF, anti-CCP, ssA, ssB, scl-70, - will call with results  - will monitor progress as we do followup CT for lung nodules - any discussion on biopsy based on progress of this on CT and your symptoms   > 50% of this > 25 min visit spent in face to face counseling or coordination of care    Dr. Brand Males, M.D., [facility].C.P Pulmonary and Critical Care Medicine Staff Physician [place] [facility] Pager: [phone], If no answer or between  15:00h - 7:00h: call [phone]  02/18/2016 11:31 AM

## 2016-02-18 NOTE — Progress Notes (Signed)
Pt seen at 02/18/16 appt and results were reviewed. Will sign off .

## 2016-02-19 LAB — ANTI-SCLERODERMA ANTIBODY: SCLERODERMA (SCL-70) (ENA) ANTIBODY, IGG: POSITIVE — AB

## 2016-02-19 LAB — RHEUMATOID FACTOR: Rhuematoid fact SerPl-aCnc: <10 IU/mL (ref <=14)

## 2016-02-19 LAB — SJOGRENS SYNDROME-A EXTRACTABLE NUCLEAR ANTIBODY: SSA (Ro) (ENA) Antibody, IgG: <1

## 2016-02-19 LAB — SJOGRENS SYNDROME-B EXTRACTABLE NUCLEAR ANTIBODY: SSB (La) (ENA) Antibody, IgG: <1

## 2016-02-19 LAB — CYCLIC CITRUL PEPTIDE ANTIBODY, IGG: Cyclic Citrullin Peptide Ab: <16 Units

## 2016-02-19 LAB — ANA: Anti Nuclear Antibody(ANA): NEGATIVE

## 2016-02-22 ENCOUNTER — Telehealth: Payer: Self-pay | Admitting: Internal Medicine

## 2016-02-22 NOTE — Telephone Encounter (Signed)
Autoimmune scleroderma antibody slightly positive. Might be related to ILD. Please refer to Dr Bronson Curb  Thanks  .Dr. Brand Males, M.D., [facility].C.P Pulmonary and Critical Care Medicine Staff Physician [place] [facility] Pager: [phone], If no answer or between  15:00h - 7:00h: call [phone]  02/22/2016 11:47 AM

## 2016-02-22 NOTE — Telephone Encounter (Signed)
LMTC x 1  

## 2016-02-22 NOTE — Telephone Encounter (Signed)
Spoke with pt and advised that Incruse was sent to [facility].  Pt states that this is fine.  She will wait to receive this via mail.

## 2016-02-22 NOTE — Telephone Encounter (Signed)
Lm with family for pt to call back x 1.

## 2016-04-14 ENCOUNTER — Telehealth: Payer: Self-pay | Admitting: Internal Medicine

## 2016-04-14 NOTE — Telephone Encounter (Signed)
Spoke with pt. States that she is having chest/shoulder/back pain. Describes the pain has being sharp. Does not think that this is heart related. Denies chest tightness, wheezing or SOB. Has been using OTC pain medications with no relief. Would like MR's recommendations.  MR - please advise. Thanks.

## 2016-04-14 NOTE — Telephone Encounter (Signed)
Pt aware of MR recommendations. Pt voiced understanding and had no further questions. Nothing further needed.

## 2016-04-14 NOTE — Telephone Encounter (Signed)
Does not sound lung related but do not know what it is from. IF persists, ges worse, any opther associated symptoms to go to ER or call PCP [name],[name], PA-C  Or call us back.

## 2016-04-30 ENCOUNTER — Other Ambulatory Visit: Payer: Self-pay | Admitting: Internal Medicine

## 2016-05-04 ENCOUNTER — Telehealth: Payer: Self-pay | Admitting: Internal Medicine

## 2016-05-04 NOTE — Telephone Encounter (Signed)
Called pharmacy to inform them that pt has request a refill with PCP. Pharmacy verbalized understanding.

## 2016-05-04 NOTE — Telephone Encounter (Signed)
Long term refills on this medication should be done by her PCP.  Thanks!

## 2016-05-04 NOTE — Telephone Encounter (Signed)
[facility] requesting a refill on Levothyroxine 112 mcg, taken daily. Would you like to refill this medication? Please advise

## 2016-06-02 ENCOUNTER — Emergency Department (HOSPITAL_COMMUNITY)
Admission: EM | Admit: 2016-06-02 | Discharge: 2016-06-02 | Disposition: A | Discharge status: Home / Self Care | Payer: Medicare Other | Attending: Emergency Medicine | Admitting: Emergency Medicine

## 2016-06-02 ENCOUNTER — Encounter (HOSPITAL_COMMUNITY): Payer: Self-pay | Admitting: Emergency Medicine

## 2016-06-02 ENCOUNTER — Emergency Department (HOSPITAL_COMMUNITY): Payer: Medicare Other

## 2016-06-02 DIAGNOSIS — R0600 Dyspnea, unspecified: Secondary | ICD-10-CM | POA: Diagnosis not present

## 2016-06-02 DIAGNOSIS — I480 Paroxysmal atrial fibrillation: Secondary | ICD-10-CM | POA: Insufficient documentation

## 2016-06-02 DIAGNOSIS — Z87891 Personal history of nicotine dependence: Secondary | ICD-10-CM | POA: Diagnosis not present

## 2016-06-02 DIAGNOSIS — E039 Hypothyroidism, unspecified: Secondary | ICD-10-CM | POA: Insufficient documentation

## 2016-06-02 DIAGNOSIS — I4891 Unspecified atrial fibrillation: Secondary | ICD-10-CM | POA: Diagnosis present

## 2016-06-02 LAB — I-STAT TROPONIN, ED: Troponin i, poc: 0 ng/mL (ref 0.00–0.08)

## 2016-06-02 LAB — PROTIME-INR
INR: 0.92
PROTHROMBIN TIME: 12.4 s (ref 11.4–15.2)

## 2016-06-02 LAB — CBC WITH DIFFERENTIAL/PLATELET
BASOS ABS: 0.1 10*3/uL (ref 0.0–0.1)
BASOS PCT: 1 %
EOS ABS: 0.3 10*3/uL (ref 0.0–0.7)
Eosinophils Relative: 7 %
HCT: 34.7 % — ABNORMAL LOW (ref 36.0–46.0)
HEMOGLOBIN: 11.5 g/dL — AB (ref 12.0–15.0)
Lymphocytes Relative: 31 %
Lymphs Abs: 1.3 10*3/uL (ref 0.7–4.0)
MCH: 32.3 pg (ref 26.0–34.0)
MCHC: 33.1 g/dL (ref 30.0–36.0)
MCV: 97.5 fL (ref 78.0–100.0)
MONOS PCT: 10 %
Monocytes Absolute: 0.4 10*3/uL (ref 0.1–1.0)
NEUTROS PCT: 51 %
Neutro Abs: 2.1 10*3/uL (ref 1.7–7.7)
Platelets: 211 10*3/uL (ref 150–400)
RBC: 3.56 MIL/uL — AB (ref 3.87–5.11)
RDW: 14.1 % (ref 11.5–15.5)
WBC: 4.2 10*3/uL (ref 4.0–10.5)

## 2016-06-02 LAB — BASIC METABOLIC PANEL
ANION GAP: 8 (ref 5–15)
BUN: 10 mg/dL (ref 6–20)
CHLORIDE: 106 mmol/L (ref 101–111)
CO2: 26 mmol/L (ref 22–32)
CREATININE: 0.58 mg/dL (ref 0.44–1.00)
Calcium: 9.2 mg/dL (ref 8.9–10.3)
GFR calc non Af Amer: >60 mL/min (ref >60)
Glucose, Bld: 108 mg/dL — ABNORMAL HIGH (ref 65–99)
Potassium: 3.6 mmol/L (ref 3.5–5.1)
SODIUM: 140 mmol/L (ref 135–145)

## 2016-06-02 LAB — BRAIN NATRIURETIC PEPTIDE: B Natriuretic Peptide: 289.8 pg/mL — ABNORMAL HIGH (ref 0.0–100.0)

## 2016-06-02 LAB — MAGNESIUM: MAGNESIUM: 1.9 mg/dL (ref 1.7–2.4)

## 2016-06-02 NOTE — ED Triage Notes (Signed)
Patient arrived to ED via [facility]. EMS reports: Patient awakened approx 0300-0315 with reports of chest pain radiating to jaw, arm, shoulder pain. No shortness of breath. Patient has hx of Afib & takes Fleckinide twice daily.  Patient took ASA 325 x 2 prior to EMS arrival.  Upon EMS arrival, pulse 130's-170's. Patient converted to NSR.  VSS. 143/64, Pulse 70, 96-99% on room air. Denies pain upon arrival to ED.

## 2016-06-02 NOTE — ED Provider Notes (Signed)
[facility] DEPT Provider Note   CSN: [id number] Arrival date & time: 06/02/16  0540     History   Chief Complaint Chief Complaint  Patient presents with  . Atrial Fibrillation    HPI [name] is a 75 y.o. female with a 14 year history of paroxysmal afib treated with flecainide and metoprolol succinate who had an episode of Afib today at 0300 which woke her from sleep. Patient states that she woke up with left sided chest pain that radiated into her neck, back and right arm which is typical for her afib. She endorsed lightheadedness with this pain but denied HA, SOB, N/V, abd pain, numbness or weakness of all extremities. Patient measured her HR at home and found it to be between 160-170. When the afib persisted for >1hr, she called EMS. En route to hospital at approximately 0500, patient spontaneously converted to normal sinus rhythm.  Patient did not take any additional doses of flecainide when the afib started. At time of interview, [name] states that she is not experiencing any symptoms and denies any chest pain, SOB, lightheadedness. Patient is not currently on any anticoagulation but is considering starting at her next cardiology visit. She states that Afib episodes are infrequent and she has not had a "long one" since last year. Patient has a history of hypothyroid that is treated with synthroid.   HPI  Past Medical History:  Diagnosis Date  . Atrial fibrillation ([place])    a. Flecainide therapy;  b. event monitor 4/12  . Chest pain    a. GXT myoview 4/12: no isch., EF 86%;   b. echo 4/12: EF 55-65%, grade 1 diast dysfxn, LAE  . Depression with anxiety   . GERD (gastroesophageal reflux disease)   . 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 Beryle Beams  . History of syncope   . Hypothyroidism     Patient Active Problem List   Diagnosis Date Noted  . Chronic cough 02/10/2016  . Irritable larynx 02/10/2016  . Stopped smoking with greater  than 40 pack year history 02/10/2016  . Chest pain, unspecified 10/06/2010  . Shortness of breath 10/06/2010  . Hypothyroidism   . Hereditary hemochromatosis ([place]) 04/22/2010  . ATRIAL FIBRILLATION 10/20/2008  . [facility] 10/20/2008  . SYNCOPE AND COLLAPSE 10/20/2008    Past Surgical History:  Procedure Laterality Date  . MOUTH SURGERY    . SHOULDER ARTHROSCOPY Left   . TOTAL ABDOMINAL HYSTERECTOMY    . VARICOSE VEIN SURGERY      OB History    No data available       Home Medications    Prior to Admission medications   Medication Sig Start Date End Date Taking? Authorizing Provider  Biotin 1000 MCG tablet Take 1,000 mcg by mouth daily.    Historical Provider, MD  Calcium Carbonate 1500 MG TABS Take 1 tablet by mouth 3 (three) times a week.    Historical Provider, MD  cholecalciferol (VITAMIN D) 1000 UNITS tablet Take 1,000 Units by mouth 3 (three) times a week.    Historical Provider, MD  cyclobenzaprine (FLEXERIL) 10 MG tablet as needed. 07/01/13   Historical Provider, MD  flecainide (TAMBOCOR) 100 MG tablet Take one-half tablet by  mouth in the morning and  one-half tablet by mouth in the evening 12/15/15   Deboraha Sprang, MD  folic acid (FOLVITE) 1 MG tablet Take 1 mg by mouth daily.      Historical Provider, MD  levothyroxine (  SYNTHROID, LEVOTHROID) 112 MCG tablet Take 112 mcg by mouth daily.      Historical Provider, MD  metoprolol succinate (TOPROL-XL) 25 MG 24 hr tablet TAKE 1 TABLET BY MOUTH  DAILY 05/02/16   Deboraha Sprang, MD  omeprazole (PRILOSEC) 40 MG capsule daily. 08/03/13   Historical Provider, MD  TOBRADEX ophthalmic ointment as needed. 07/01/13   Historical Provider, MD  umeclidinium bromide (INCRUSE ELLIPTA) 62.5 MCG/INH AEPB Inhale 1 puff into the lungs daily. 02/18/16   Brand Males, MD  VITAMIN B1-B12 IM Inject 1,000 mcg into the muscle every 30 (thirty) days.     Historical Provider, MD  VITAMIN E PO Take 1 tablet by mouth 3 (three) times a week.    Historical  Provider, MD  zolpidem (AMBIEN) 10 MG tablet as needed. 07/17/13   Historical Provider, MD    Family History Family History  Problem Relation Age of Onset  . Stroke Mother   . Heart disease Father 90  . Hemochromatosis Brother     Social History Social History  Substance Use Topics  . Smoking status: Former Smoker    Packs/day: 1.00    Years: 42.00    Quit date: 06/27/1997  . Smokeless tobacco: Never Used  . Alcohol use 0.6 oz/week    1 Standard drinks or equivalent per week     Comment: 2 DRINKS DAILY     Allergies   Patient has no known allergies.   Review of Systems Review of Systems  ROS 10 Systems reviewed and are negative for acute change except as noted in the HPI.     Physical Exam Updated Vital Signs BP 125/58 (BP Location: Right Arm)   Pulse 63   Temp 97.8 F (36.6 C) (Oral)   Resp 18   Ht 5\' 4"  (1.626 m)   Wt 145 lb (65.8 kg)   SpO2 99%   BMI 24.89 kg/m   Physical Exam  Constitutional: She is oriented to person, place, and time. She appears well-developed.  HENT:  Head: Normocephalic and atraumatic.  Eyes: EOM are normal.  Neck: Neck supple.  Cardiovascular: Normal rate and regular rhythm.   Pulmonary/Chest: Effort normal and breath sounds normal. No respiratory distress.  Abdominal: Soft. Bowel sounds are normal.  Neurological: She is alert and oriented to person, place, and time.  Skin: Skin is warm and dry.  Nursing note and vitals reviewed.    ED Treatments / Results  Labs (all labs ordered are listed, but only abnormal results are displayed) Labs Reviewed  CBC WITH DIFFERENTIAL/PLATELET - Abnormal; Notable for the following:       Result Value   RBC 3.56 (*)    Hemoglobin 11.5 (*)    HCT 34.7 (*)    All other components within normal limits  BASIC METABOLIC PANEL - Abnormal; Notable for the following:    Glucose, Bld 108 (*)    All other components within normal limits  BRAIN NATRIURETIC PEPTIDE - Abnormal; Notable for the  following:    B Natriuretic Peptide 289.8 (*)    All other components within normal limits  PROTIME-INR  MAGNESIUM  I-STAT TROPOININ, ED    EKG  EKG Interpretation  Date/Time:  Thursday June 02 2016 05:43:17 EST Ventricular Rate:  77 PR Interval:    QRS Duration: 91 QT Interval:  389 QTC Calculation: 441 R Axis:   -23 Text Interpretation:  Sinus rhythm Borderline left axis deviation Low voltage, precordial leads Abnormal R-wave progression, early transition No acute changes  Confirmed by Kathrynn Humble, MD, Thelma Comp [phone]) on 06/02/2016 7:36:32 AM       Radiology Dg Chest 2 View  Result Date: 06/02/2016 CLINICAL DATA:  Dyspnea.  Atrial fibrillation. EXAM: CHEST  2 VIEW COMPARISON:  08/26/2014 FINDINGS: The heart size and mediastinal contours are within normal limits. Both lungs are clear. The visualized skeletal structures are unremarkable. IMPRESSION: No active cardiopulmonary disease. Electronically Signed   By: Andreas Newport M.D.   On: 06/02/2016 06:34    Procedures Procedures (including critical care time)  Medications Ordered in ED Medications - No data to display   Initial Impression / Assessment and Plan / ED Course  I have reviewed the triage vital signs and the nursing notes.  Pertinent labs & imaging results that were available during my care of the patient were reviewed by me and considered in my medical decision making (see chart for details).  At the time of encounter, patient was asymptomatic and in normal sinus rhythm for > 3 hours. Possible causes of Afib episode including volume overload, new medications, new ischemic event, electrolyte abnormalities. There were no symptoms consistent with infection and pt has no PE risk factors.  MDM 1. Patient appears to be of normal fluid status and BNP is elevated (289.8). There is no previous BNP in EMR. CXR was [facility]. Patient does not have JVD, pedal edema. Patient's last echo was in 2012 and EF was WNL at that time.  BNP elevation could be simply due to afib with RVR causing heart strain earlier, or could be present due to new onset adib. Pt is not in decompensated CHF - and so we will defer further management to  Cards. -Patient is currently not on anticoagulation with CHA2DS2/VAS score of 2 (1 for age 27 and 22 for gender female). In January patient will turn 77 which will increase score to 3. Discussed risks/benefits of anticoagulation with patient and stated that she will likely start on anticoagulation to reduce stroke risk at next cardiology appointment.  CHA2DS2/VAS Stroke Risk Points      1 >= 2 Points: High Risk  1 - 1.99 Points: Medium Risk  0 Points: Low Risk    The patient's score has not changed in the past year.:  No Change        Details    Note: External data might be a factor in metrics not marked with    Points Metrics   This score determines the patient's risk of having a stroke if the  patient has atrial fibrillation.       0 Has Congestive Heart Failure:  No   0 Has Vascular Disease:  No   0 Has Hypertension:  No   0 Age:  34   0 Has Diabetes:  No   0 Had Stroke:  No Had TIA:  No Had thromboembolism:  No   1 Female:  Yes    3. Patient's EKG did not show any signs of ischemic changes. While presentation is concerning for possible ischemia, troponin levels were undetectable. Patient also states that this is her normal Afib presentation so suspicion for ACS is low at this time. Additionally, pt denies any exertional chest pain/dib during her routine  activities.  4. Patients BMP was unremarkable. Unlikely that electrolyte abnormality contributed to presentation based on current lab results.  Will discharge patient with plan to follow-up with cardiology and determine need for anticoagulation and possible medication changes at this time.     Clinical Course  Final Clinical Impressions(s) / ED Diagnoses   Final diagnoses:  PAF (paroxysmal atrial fibrillation) [facility])     New Prescriptions Discharge Medication List as of 06/02/2016  8:01 AM       Varney Biles, MD 06/02/16 1506

## 2016-06-02 NOTE — Discharge Instructions (Signed)
RETURN TO THE ER IF THE SYMPTOMS RETURN.

## 2016-06-02 NOTE — ED Notes (Signed)
Patient transported to X-ray 

## 2016-06-15 ENCOUNTER — Ambulatory Visit (INDEPENDENT_AMBULATORY_CARE_PROVIDER_SITE_OTHER)
Admission: RE | Admit: 2016-06-15 | Discharge: 2016-06-15 | Disposition: A | Discharge status: Home / Self Care | Payer: Medicare Other | Source: Ambulatory Visit | Attending: Internal Medicine | Admitting: Internal Medicine

## 2016-06-15 DIAGNOSIS — J849 Interstitial pulmonary disease, unspecified: Secondary | ICD-10-CM | POA: Diagnosis not present

## 2016-06-16 ENCOUNTER — Encounter: Payer: Self-pay | Admitting: Internal Medicine

## 2016-06-16 ENCOUNTER — Encounter (INDEPENDENT_AMBULATORY_CARE_PROVIDER_SITE_OTHER): Payer: Self-pay

## 2016-06-16 ENCOUNTER — Ambulatory Visit (INDEPENDENT_AMBULATORY_CARE_PROVIDER_SITE_OTHER): Payer: Medicare Other | Admitting: Internal Medicine

## 2016-06-16 VITALS — BP 130/74 | HR 60 | Ht 64.0 in | Wt 144.8 lb

## 2016-06-16 DIAGNOSIS — I480 Paroxysmal atrial fibrillation: Secondary | ICD-10-CM | POA: Diagnosis not present

## 2016-06-16 NOTE — Patient Instructions (Signed)
Medication Instructions: - Your physician recommends that you continue on your current medications as directed. Please refer to the Current Medication list given to you today.  Labwork: - none ordered  Procedures/Testing: - none ordered  Follow-Up: - Your physician wants you to follow-up in: 1 year with Dr. Caryl Comes. You will receive a reminder letter in the mail two months in advance. If you don't receive a letter, please call our office to schedule the follow-up appointment.  Any Additional Special Instructions Will Be Listed Below (If Applicable).  - Please call Dr. Olin Pia nurse, Nira Conn, at [phone] when you decide which blood thinner you want to take 1) Eliquis- twice daily 2) Pradaxa- twice daily 3) Xarelto- once daily    If you need a refill on your cardiac medications before your next appointment, please call your pharmacy.

## 2016-06-16 NOTE — Progress Notes (Signed)
Patient Care Team: Aura Dials, PA-C as PCP - General (Physician Assistant) Annia Belt, MD as Consulting Physician (Hematology) Deboraha Sprang, MD as Consulting Physician (Cardiology)   HPI  [name] is a 75 y.o. female seen in followup for atrial fibrillation for which he takes flecainide 50 mg twice daily. This occurs  in the context of hemochromatosis.  She is followed for this by Dr. Beryle Beams.    She was seen in the emergency room 11/14 for chest pain and palpitations. Per EMS note, she was in atrial fibrillation on their arrival; was in sinus by the time she was seen in the ER   this was associated with severe chest discomfort. Troponins were negative. She reverted spontaneously. She did not take metoprolol or flecainide.   t   Her CHADS-VASc score is 3; age/gender.  She has not been on anticoagulation although Dr. Timoteo Expose note 4/16 outlines that he sees no problems with using anticoagulation is indicated  At this juncture she would like to begin anticoagulation     She also has been diagnosed as having hemochromatosis identified by the C282Y gene mutation for which she is homozygous, and by , piss poor luck, with her heterozygous spouse, she gave birth to three daughters all of whom are homozygous.   Normal nuclear study 4/15  Past Medical History:  Diagnosis Date  . Atrial fibrillation ([place])    a. Flecainide therapy;  b. event monitor 4/12  . Chest pain    a. GXT myoview 4/12: no isch., EF 86%;   b. echo 4/12: EF 55-65%, grade 1 diast dysfxn, LAE  . Depression with anxiety   . GERD (gastroesophageal reflux disease)   . 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 Beryle Beams  . History of syncope   . Hypothyroidism     Past Surgical History:  Procedure Laterality Date  . MOUTH SURGERY    . SHOULDER ARTHROSCOPY Left   . TOTAL ABDOMINAL HYSTERECTOMY    . VARICOSE VEIN SURGERY      Current Outpatient Prescriptions    Medication Sig Dispense Refill  . Biotin 1000 MCG tablet Take 1,000 mcg by mouth daily.    . Calcium Carbonate 1500 MG TABS Take 1 tablet by mouth 3 (three) times a week.    . cholecalciferol (VITAMIN D) 1000 UNITS tablet Take 1,000 Units by mouth 3 (three) times a week.    . cyclobenzaprine (FLEXERIL) 10 MG tablet as needed.    . flecainide (TAMBOCOR) 100 MG tablet Take one-half tablet by  mouth in the morning and  one-half tablet by mouth in the evening 90 tablet 3  . folic acid (FOLVITE) 1 MG tablet Take 1 mg by mouth daily.      Marland Kitchen levothyroxine (SYNTHROID, LEVOTHROID) 112 MCG tablet Take 112 mcg by mouth daily.      . metoprolol succinate (TOPROL-XL) 25 MG 24 hr tablet TAKE 1 TABLET BY MOUTH  DAILY 90 tablet 3  . omeprazole (PRILOSEC) 40 MG capsule daily.    [facility] ophthalmic ointment as needed.    . umeclidinium bromide (INCRUSE ELLIPTA) 62.5 MCG/INH AEPB Inhale 1 puff into the lungs daily. 30 each 5  . VITAMIN B1-B12 IM Inject 1,000 mcg into the muscle every 30 (thirty) days.     Marland Kitchen VITAMIN E PO Take 1 tablet by mouth 3 (three) times a week.    . zolpidem (AMBIEN) 10 MG tablet as needed.  No current facility-administered medications for this visit.     No Known Allergies  Review of Systems negative except from HPI and PMH  Physical Exam BP 130/74   Pulse 60   Ht 5\' 4"  (1.626 m)   Wt 144 lb 12.8 oz (65.7 kg)   SpO2 94%   BMI 24.85 kg/m  Well developed and nourished in no acute distress HENT normal Neck supple with JVP-flat Clear Regular rate and rhythm, no murmurs or gallops Abd-soft with active BS No Clubbing cyanosis 1. Syncope + edema Skin-warm and dry A & Oriented  Grossly normal sensory and motor function\  ECG demonstrates Sinus rhythm at 57 Intervals 17/08/43   Assessment and  Plan  1 Atrial Fibrillation:     we have discussed again a plan for  recurrent atrial fibrillation;  she could take 200 mg of flecainide on a one-time basis.  She can also  take an extra metoprolol 25 mg.    We discussed the use of the NOACs compared to Coumadin. We briefly reviewed the data of at least comparability in stroke prevention, bleeding and outcome. We discussed some of the new once wherein somewhat associated with decreased ischemic stroke risk, one to be taken daily, and has been shown to be comparable and bleeding risk to aspirin.  We also discussed bleeding associated with warfarin as well as NOACs and a wall bleeding as a complication of all these drugs intracranial bleeding is more frequently associated with warfarin then the NOACs and a GI bleeding is more commonly associated with the latter  We have given her the list of the 3 anticoagulants. Her husband takes Rivaroxaban she would prefer that if it remains on the preferred drug list. She will let us know  Laboratories are normal  12/17 ne

## 2016-06-22 ENCOUNTER — Telehealth: Payer: Self-pay | Admitting: Internal Medicine

## 2016-06-22 DIAGNOSIS — R0602 Shortness of breath: Secondary | ICD-10-CM

## 2016-06-22 NOTE — Telephone Encounter (Signed)
Let [name] know that compared to august 2017 that decv 2017 shows no change. THe nodules and mild ILD are still there without change.   Plan  - give her first avail to come in and discuss  - repeat HRCT in 1 year - dec 2018  Thanks  [name], M.D., [facility].C.P Pulmonary and Critical Care Medicine Staff Physician [facility] Pager: [phone], If no answer or between  15:00h - 7:00h: call [phone]  06/22/2016 2:10 AM

## 2016-06-24 NOTE — Telephone Encounter (Signed)
Attempted to contact pt. No answer, no option to leave a message. Will try back.  

## 2016-06-28 NOTE — Telephone Encounter (Signed)
Called and spoke to pt. Informed her of the results and recs per MR. Order placed for HRCT and appt made with MR for 08/02/2016. Pt verbalized understanding and denied any further questions or concerns at this time.

## 2016-06-29 ENCOUNTER — Telehealth: Payer: Self-pay | Admitting: Internal Medicine

## 2016-06-29 MED ORDER — RIVAROXABAN 20 MG PO TABS: ORAL_TABLET | ORAL | 3 refills | Status: DC

## 2016-06-29 NOTE — Telephone Encounter (Signed)
New Message  Pt c/o medication issue:  1. Name of Medication: Xarelto once daily  2. How are you currently taking this medication (dosage and times per day)? Not Listed  3. Are you having a reaction (difficulty breathing--STAT)? N/A  4. What is your medication issue? Pt voiced she would like to go on this medication.  Please f/u

## 2016-06-29 NOTE — Telephone Encounter (Signed)
I called and spoke with the patient. She is aware that her Xarelto dose will be 20 mg once daily with food. She is agreeable. RX sent to [facility].

## 2016-06-29 NOTE — Telephone Encounter (Signed)
20 mg daily

## 2016-06-29 NOTE — Telephone Encounter (Signed)
To Dr. Caryl Comes to confirm dose of Xarelto.

## 2016-08-02 ENCOUNTER — Ambulatory Visit: Payer: Medicare Other | Admitting: Internal Medicine

## 2016-08-23 ENCOUNTER — Ambulatory Visit: Payer: Medicare Other | Admitting: Internal Medicine

## 2016-08-31 ENCOUNTER — Encounter: Payer: Self-pay | Admitting: Internal Medicine

## 2016-08-31 ENCOUNTER — Ambulatory Visit (INDEPENDENT_AMBULATORY_CARE_PROVIDER_SITE_OTHER): Payer: Medicare Other | Admitting: Internal Medicine

## 2016-08-31 DIAGNOSIS — J387 Other diseases of larynx: Secondary | ICD-10-CM

## 2016-08-31 DIAGNOSIS — J8410 Pulmonary fibrosis, unspecified: Secondary | ICD-10-CM | POA: Diagnosis not present

## 2016-08-31 DIAGNOSIS — J328 Other chronic sinusitis: Secondary | ICD-10-CM | POA: Diagnosis not present

## 2016-08-31 DIAGNOSIS — I7300 Raynaud's syndrome without gangrene: Secondary | ICD-10-CM

## 2016-08-31 DIAGNOSIS — J439 Emphysema, unspecified: Secondary | ICD-10-CM | POA: Diagnosis not present

## 2016-08-31 DIAGNOSIS — J329 Chronic sinusitis, unspecified: Secondary | ICD-10-CM | POA: Insufficient documentation

## 2016-08-31 MED ORDER — TIOTROPIUM BROMIDE MONOHYDRATE 2.5 MCG/ACT IN AERS: 2.0000 | INHALATION_SPRAY | Freq: Every day | RESPIRATORY_TRACT | 3 refills | Status: DC

## 2016-08-31 MED ORDER — FLUTICASONE PROPIONATE 50 MCG/ACT NA SUSP: 2.0000 | Freq: Every day | NASAL | 5 refills | Status: DC

## 2016-08-31 NOTE — Assessment & Plan Note (Signed)
This is contrinubting to cough  Plan Ct sinus wo contrast  take generic fluticasone inhaler 2 squirts each nostril daily Will reassess at followup

## 2016-08-31 NOTE — Assessment & Plan Note (Signed)
Conttributing to cough and shortness of  Breath - ? Progression  PLAN Change incruse to spiriva respimat  Refer Dr Estanislado Pandy to rule out autoimmune-ILD In 3 months do Pre-bd spiro and dlco only. No lung volume or bd response Timing of repeat CT chest depending on Dr Estanislado Pandy opinion

## 2016-08-31 NOTE — Patient Instructions (Signed)
Pulmonary emphysema with fibrosis of lung ([place]) Conttributing to cough and shortness of  Breath - ? Progression  PLAN Change incruse to spiriva respimat  Refer Dr Estanislado Pandy to rule out autoimmune-ILD In 3 months do Pre-bd spiro and dlco only. No lung volume or bd response Timing of repeat CT chest depending on Dr Estanislado Pandy opinion  [name] phenomenon (by history or observed) Based on history of Raynaud + autoimmune antibody positive and + ILD - cncern you have systemic autoimmune disease  Plan  - refer Dr Charlette Caffey of rheum  Irritable larynx Definite element of cough neuropathy Rx for this is voice rehab and gabapentin but will address this in future  Sinusitis, chronic This is contrinubting to cough  Plan Ct sinus wo contrast  take generic fluticasone inhaler 2 squirts each nostril daily Will reassess at followup  Followup  - 3 months to Pre-bd spiro and dlco only. No lung volume or bd response.  - return to see me after pft in 3 months

## 2016-08-31 NOTE — Assessment & Plan Note (Signed)
Definite element of cough neuropathy Rx for this is voice rehab and gabapentin but will address this in future

## 2016-08-31 NOTE — Progress Notes (Signed)
Subjective:     Patient ID: [name], female   DOB: Oct 31, 1940, 76 y.o.   MRN: [id number]  HPI    PCP [name],[name], PA-C   HPI  IOV 02/10/2016  Chief Complaint  Patient presents with  . Pulmonary Consult    Pt referred by Dr. Melford Aase for chronic cough x 1 year. Pt states she feels she has a tickle that is causing her dry cough. Pt states she has DOE when climbing stairs. Pt deneis CP/tightness.     76 year old female with hemochromatosis homozygous gene followed by Dr. Beryle Beams (last phlebotomy many years ago and is on serial monitoring) with children and siblings with active disease. She has also atrial fibrillation followed by Dr. Caryl Comes. Reports insidious onset of chronic cough in the last 1 year. It is stable since onset. It fluctuates between mild and severe in severity. It is mostly dry in quality. Mostly present in the daytime but sometimes also wakes her up at night. It is definitely not progressive. It is episodic and present every day. Aggravated by talking sometimes and then the throat feels dry associated with tickle in the throat and also sensation the cough is coming from the upper chest retrosternally and sometimes relieved by drinking water or chewing on a lozenge. Also aggravated by seasonal changes particularly in the spring and the fall which makes her think she has allergies. There is sometimes associated gag. She also reports nonspecific occasional wheezing and associated shortness of breath that is nonspecific but present with exertion and relieved by rest No other clear cut aggravating or relieving factors.   cough associated history  - Medications: She is not on fish oil or ACE inhibitors - Sinus drainage: She does admit to spring allergies. She did have something removed from her hard palate several years ago by ENT does not know details - Acid reflux: She says she is significant acid reflux and is on Prilosec. Without which she'll have significant  symptoms - Pulmonary disease: FenO 12 02/10/2016  and normal. She denies any personal history of asthma or pulmonary fibrosis of COPD or emphysema smoked one pack per day started smoking at age 20 and quit in 1999. Making a 42 pack smoking history. Chest x-ray 08/26/2014 personally visualized is clear - Tobacco :  reports that she quit smoking about 18 years ago. She has a 42.00 pack-year smoking history. She has never used smokeless tobacco.       OV 02/18/2016  Chief Complaint  Patient presents with  . Follow-up    Pt here after PFT and HRCT. Pt denies changes in SOB and cough. Pt denies any new complaints at this time.    Follow-up chronic cough. This visit is to follow-up on test results of function test and high resolution CT chest which are described below9 results show isolated reduction in diffusion capacity which can be explained by possible early ILD and also emphysema. There are also new findings of nodule 6 mm bilaterally on lower lobes. She prefers a very cautious approach to this evaluation.     Pulmonary function test 02/12/2016 - FVC 2.8 cm/101%, FEV1 1.9 L/91%. Ratio 60/90%. Total lung capacity 115%. DLCO reduced at 14.5/60%. She is isolated reduction in diffusion capacity    HRCT chest 02/16/16 IMPRESSION: 1. Mild patchy subpleural reticulation in both lungs with a basilar predominance. No significant traction bronchiectasis. No frank honeycombing. Findings could represent an interstitial lung disease such as nonspecific interstitial pneumonia (NSIP), with early usual interstitial pneumonia (UIP)  not excluded. A follow-up high-resolution chest CT study in 12 months is recommended to assess temporal pattern stability. 2. Bilateral lower lobe solid pulmonary nodules, largest 6 mm. Non-contrast chest CT at 3-6 months is recommended. If the nodules are stable at time of repeat CT, then future CT at 18-24 months (from today's scan) is considered optional for low-risk  patients, but is recommended for high-risk patients. This recommendation follows the consensus statement: Guidelines for Management of Incidental Pulmonary Nodules Detected on CT Images:From the Fleischner Society 2017; published online before print (10.1148/radiol.7902409735). 3. Additional findings include mild-to-moderate centrilobular emphysema, aortic atherosclerosis and 2 vessel coronary atherosclerosis.   Electronically Signed   By: Ilona Sorrel M.D.   On: 02/16/2016 14:14   OV 08/31/2016  Chief Complaint  Patient presents with  . Follow-up    HRCT was never scheduled. Pt states that the cough is still present >> slightly improved since last OV. Pt states that she feels her allergies has gotten it ramped up again.    Follow-up multifactorial cough associated with autoimmune antibody positive, interstitial lung disease and emphysema previous history of smoking  After last visit in August 2017 she had high resolution CT chest in December 2017 that showed persistence of ILD. I personally visualized the CT chest. He comes for follow-up. She tells me that since August 2017 she has insidious onset of shortness of breath and is progressively worse. It is mild and present only on inclines is not therefore tenderness. In terms of her cough is persistent. It is associated with significant sinus drainage that she thinks is allergy related. She constantly clears her throat. Lab review shows autoimmune antibody positive in August 2017 with scleroderma antibody slightly elevated at 6.4. I referred her to rheumatology but she does not remember this and she did not make this follow-up. She also tells me that she definitely has a long history of [name] phenomena in her fingers but no one has ever formally diagnosed with connective tissue disease.   Dr Lorenza Cambridge Reflux Symptom Index (> 13-15 suggestive of LPR cough)  02/10/2016   Hoarseness of problem with voice 0  Clearing  Of Throat 2   Excess throat mucus or feeling of post nasal drip 2  Difficulty swallowing food, liquid or tablets 0  Cough after eating or lying down 1  Breathing difficulties or choking episodes 1  Troublesome or annoying cough 2  Sensation of something sticking in throat or lump in throat 0  Heartburn, chest pain, indigestion, or stomach acid coming up 3  TOTAL 11      has a past medical history of Atrial fibrillation ([place]); Chest pain; Depression with anxiety; GERD (gastroesophageal reflux disease); Hemochromatosis; History of syncope; and Hypothyroidism.   reports that she quit smoking about 19 years ago. She has a 42.00 pack-year smoking history. She has never used smokeless tobacco.  Past Surgical History:  Procedure Laterality Date  . MOUTH SURGERY    . SHOULDER ARTHROSCOPY Left   . TOTAL ABDOMINAL HYSTERECTOMY    . VARICOSE VEIN SURGERY      No Known Allergies  Immunization History  Administered Date(s) Administered  . Pneumococcal Conjugate-13 06/12/2014  . Pneumococcal Polysaccharide-23 06/27/2010  . Tdap 03/16/2011  . Zoster 06/27/2010    Family History  Problem Relation Age of Onset  . Stroke Mother   . Heart disease Father 18  . Hemochromatosis Brother      Current Outpatient Prescriptions:  .  Biotin 1000 MCG tablet, Take 1,000 mcg by  mouth daily., Disp: , Rfl:  .  Calcium Carbonate 1500 MG TABS, Take 1 tablet by mouth 3 (three) times a week., Disp: , Rfl:  .  cholecalciferol (VITAMIN D) 1000 UNITS tablet, Take 1,000 Units by mouth 3 (three) times a week., Disp: , Rfl:  .  cyclobenzaprine (FLEXERIL) 10 MG tablet, as needed., Disp: , Rfl:  .  flecainide (TAMBOCOR) 100 MG tablet, Take one-half tablet by  mouth in the morning and  one-half tablet by mouth in the evening, Disp: 90 tablet, Rfl: 3 .  folic acid (FOLVITE) 1 MG tablet, Take 1 mg by mouth daily.  , Disp: , Rfl:  .  levothyroxine (SYNTHROID, LEVOTHROID) 112 MCG tablet, Take 112 mcg by mouth daily.  , Disp: ,  Rfl:  .  metoprolol succinate (TOPROL-XL) 25 MG 24 hr tablet, TAKE 1 TABLET BY MOUTH  DAILY, Disp: 90 tablet, Rfl: 3 .  omeprazole (PRILOSEC) 40 MG capsule, daily., Disp: , Rfl:  .  rivaroxaban (XARELTO) 20 MG TABS tablet, Take one tablet (20 mg) by mouth once daily with food, Disp: 90 tablet, Rfl: 3 .  TOBRADEX ophthalmic ointment, as needed., Disp: , Rfl:  .  umeclidinium bromide (INCRUSE ELLIPTA) 62.5 MCG/INH AEPB, Inhale 1 puff into the lungs daily., Disp: 30 each, Rfl: 5 .  VITAMIN B1-B12 IM, Inject 1,000 mcg into the muscle every 30 (thirty) days. , Disp: , Rfl:  .  VITAMIN E PO, Take 1 tablet by mouth 3 (three) times a week., Disp: , Rfl:  .  zolpidem (AMBIEN) 10 MG tablet, as needed., Disp: , Rfl:     Review of Systems     Objective:   Physical Exam  Constitutional: She is oriented to person, place, and time. She appears well-developed and well-nourished. No distress.  HENT:  Head: Normocephalic and atraumatic.  Right Ear: External ear normal.  Left Ear: External ear normal.  Mouth/Throat: Oropharynx is clear and moist. No oropharyngeal exudate.  Nasal twang Clears throat Post nasal drip +  Eyes: Conjunctivae and EOM are normal. Pupils are equal, round, and reactive to light. Right eye exhibits no discharge. Left eye exhibits no discharge. No scleral icterus.  Neck: Normal range of motion. Neck supple. No JVD present. No tracheal deviation present. No thyromegaly present.  Cardiovascular: Normal rate, regular rhythm, normal heart sounds and intact distal pulses.  Exam reveals no gallop and no friction rub.   No murmur heard. Pulmonary/Chest: Effort normal. No respiratory distress. She has no wheezes. She has rales. She exhibits no tenderness.  Abdominal: Soft. Bowel sounds are normal. She exhibits no distension and no mass. There is no tenderness. There is no rebound and no guarding.  Musculoskeletal: Normal range of motion. She exhibits no edema or tenderness.   Lymphadenopathy:    She has no cervical adenopathy.  Neurological: She is alert and oriented to person, place, and time. She has normal reflexes. No cranial nerve deficit. She exhibits normal muscle tone. Coordination normal.  Skin: Skin is warm and dry. No rash noted. She is not diaphoretic. No erythema. No pallor.  Normal skin wrinkles Mild pallor of fingers  Psychiatric: She has a normal mood and affect. Her behavior is normal. Judgment and thought content normal.  Vitals reviewed.  Vitals:   08/31/16 1127  BP: 124/82  Pulse: (!) 55  SpO2: 96%  Weight: 143 lb 3.2 oz (65 kg)  Height: 5\' 4"  (1.626 m)   Estimated body mass index is 24.58 kg/m as calculated from the  following:   Height as of this encounter: 5\' 4"  (1.626 m).   Weight as of this encounter: 143 lb 3.2 oz (65 kg).      Assessment:       ICD-9-CM ICD-10-CM   1. Pulmonary emphysema with fibrosis of lung (HCC) 492.8 J43.9    515 J84.10   2. [name] phenomenon without gangrene 443.0 I73.00   3. Irritable larynx 478.79 J38.7   4. Other chronic sinusitis 473.8 J32.8    Raynaud -0 new    Plan:     Pulmonary emphysema with fibrosis of lung (HCC) Conttributing to cough and shortness of  Breath - ? Progression  PLAN Change incruse to spiriva respimat  Refer Dr Estanislado Pandy to rule out autoimmune-ILD In 3 months do Pre-bd spiro and dlco only. No lung volume or bd response Timing of repeat CT chest depending on Dr Estanislado Pandy opinion  [name] phenomenon (by history or observed) Based on history of Raynaud + autoimmune antibody positive and + ILD - cncern you have systemic autoimmune disease  Plan  - refer Dr Charlette Caffey of rheum  Irritable larynx Definite element of cough neuropathy Rx for this is voice rehab and gabapentin but will address this in future  Sinusitis, chronic This is contrinubting to cough  Plan Ct sinus wo contrast  take generic fluticasone inhaler 2 squirts each nostril daily Will  reassess at followup   Dr. Brand Males, M.D., [facility].C.P Pulmonary and Critical Care Medicine Staff Physician [facility] Pager: [phone], If no answer or between  15:00h - 7:00h: call [phone]  08/31/2016 12:13 PM

## 2016-08-31 NOTE — Assessment & Plan Note (Signed)
Based on history of Raynaud + autoimmune antibody positive and + ILD - cncern you have systemic autoimmune disease  Plan  - refer Dr Charlette Caffey of rheum

## 2016-10-10 ENCOUNTER — Other Ambulatory Visit (INDEPENDENT_AMBULATORY_CARE_PROVIDER_SITE_OTHER): Payer: Medicare Other

## 2016-10-11 LAB — CBC WITH DIFFERENTIAL/PLATELET
BASOS ABS: 0.1 10*3/uL (ref 0.0–0.2)
Basos: 1 %
EOS (ABSOLUTE): 0.4 10*3/uL (ref 0.0–0.4)
EOS: 8 %
HEMOGLOBIN: 12.4 g/dL (ref 11.1–15.9)
Hematocrit: 37.6 % (ref 34.0–46.6)
Immature Grans (Abs): 0 10*3/uL (ref 0.0–0.1)
Immature Granulocytes: 0 %
LYMPHS ABS: 1.3 10*3/uL (ref 0.7–3.1)
LYMPHS: 27 %
MCH: 32.4 pg (ref 26.6–33.0)
MCHC: 33 g/dL (ref 31.5–35.7)
MCV: 98 fL — ABNORMAL HIGH (ref 79–97)
MONOCYTES: 12 %
MONOS ABS: 0.6 10*3/uL (ref 0.1–0.9)
NEUTROS ABS: 2.6 10*3/uL (ref 1.4–7.0)
Neutrophils: 52 %
PLATELETS: 244 10*3/uL (ref 150–379)
RBC: 3.83 x10E6/uL (ref 3.77–5.28)
RDW: 14.9 % (ref 12.3–15.4)
WBC: 5 10*3/uL (ref 3.4–10.8)

## 2016-10-11 LAB — COMPREHENSIVE METABOLIC PANEL
ALBUMIN: 4.3 g/dL (ref 3.5–4.8)
ALK PHOS: 68 IU/L (ref 39–117)
ALT: 12 IU/L (ref 0–32)
AST: 19 IU/L (ref 0–40)
Albumin/Globulin Ratio: 1.8 (ref 1.2–2.2)
BILIRUBIN TOTAL: 0.4 mg/dL (ref 0.0–1.2)
BUN / CREAT RATIO: 15 (ref 12–28)
BUN: 11 mg/dL (ref 8–27)
CHLORIDE: 101 mmol/L (ref 96–106)
CO2: 25 mmol/L (ref 18–29)
Calcium: 9.7 mg/dL (ref 8.7–10.3)
Creatinine, Ser: 0.74 mg/dL (ref 0.57–1.00)
GFR calc Af Amer: 92 mL/min/{1.73_m2} (ref >59)
GFR calc non Af Amer: 80 mL/min/{1.73_m2} (ref >59)
GLUCOSE: 86 mg/dL (ref 65–99)
Globulin, Total: 2.4 g/dL (ref 1.5–4.5)
Potassium: 4.3 mmol/L (ref 3.5–5.2)
Sodium: 141 mmol/L (ref 134–144)
Total Protein: 6.7 g/dL (ref 6.0–8.5)

## 2016-10-11 LAB — FERRITIN: FERRITIN: 15 ng/mL (ref 15–150)

## 2016-10-17 ENCOUNTER — Telehealth: Payer: Self-pay | Admitting: *Deleted

## 2016-10-17 NOTE — Progress Notes (Signed)
Office Visit Note  Patient: [name]             Date of Birth: 1941-02-28           MRN: [id number]             PCP: Selinda Orion Referring: Brand Males, MD Visit Date: 10/20/2016 Occupation: @GUAROCC @    Subjective:  [name] phenomenon.   History of Present Illness: [name] is a 76 y.o. female seen in consultation per request of Dr. Chase Caller for evaluation of Raynauds. According to patient she's had Raynauds for last 8-9 years. She denies any history of digital ulcers. It only involves her hands. She states about 3 years ago she started having dry cough initially she thought it was allergies and then it became more persistent. She was seen by her PCP last year referred her to Dr. Chase Caller. He did PFTs and CT scan and diagnosed her with emphysema and pulmonary fibrosis . He also did some labs which came positive for SCL 70. She has had some problems with this disease of her lumbar spine and right sided radiculopathy for which she's seeing Dr. Deatra Robinson in the past. She states recently she's been having some muscle spasms in her lower back. She's taken Robaxin in the past which is been helpful. She also complains of some knee joint discomfort. Her right knee joint hurts more than left knee joint. She denies any history of joint swelling.  Activities of Daily Living:  Patient reports morning stiffness for 5 minutes.   Patient Reports nocturnal pain. Lower back Difficulty dressing/grooming: Denies Difficulty climbing stairs: Denies experiences shortness of breath Difficulty getting out of chair: Denies Difficulty using hands for taps, buttons, cutlery, and/or writing: Denies   Review of Systems  Constitutional: Negative for fatigue, night sweats, weight gain, weight loss and weakness.  HENT: Negative for mouth sores, trouble swallowing, trouble swallowing, mouth dryness and nose dryness.   Eyes: Negative for pain, redness, visual disturbance and dryness.   Respiratory: Positive for cough and shortness of breath. Negative for difficulty breathing.   Cardiovascular: Negative for chest pain, palpitations, hypertension, irregular heartbeat and swelling in legs/feet.  Gastrointestinal: Negative for blood in stool, constipation and diarrhea.  Endocrine: Negative for increased urination.  Genitourinary: Negative for vaginal dryness.  Musculoskeletal: Positive for arthralgias, joint pain, myalgias and myalgias. Negative for joint swelling, muscle weakness, morning stiffness and muscle tenderness.       Lower back  Skin: Positive for color change. Negative for rash, hair loss, skin tightness, ulcers and sensitivity to sunlight.  Allergic/Immunologic: Negative for susceptible to infections.  Neurological: Negative for dizziness, memory loss and night sweats.  Hematological: Negative for swollen glands.  Psychiatric/Behavioral: Positive for sleep disturbance. Negative for depressed mood. The patient is not nervous/anxious.        Nocturnal pain    PMFS History:  Patient Active Problem List   Diagnosis Date Noted  . Pulmonary emphysema with fibrosis of lung ([place]) 08/31/2016  . [name] phenomenon (by history or observed) 08/31/2016  . Sinusitis, chronic 08/31/2016  . Chronic cough 02/10/2016  . Irritable larynx 02/10/2016  . Stopped smoking with greater than 40 pack year history 02/10/2016  . Chest pain, unspecified 10/06/2010  . Shortness of breath 10/06/2010  . Hypothyroidism   . Hereditary hemochromatosis ([place]) 04/22/2010  . ATRIAL FIBRILLATION 10/20/2008  . [facility] 10/20/2008  . SYNCOPE AND COLLAPSE 10/20/2008    Past Medical History:  Diagnosis Date  . Atrial fibrillation (  [place])    a. Flecainide therapy;  b. event monitor 4/12  . Chest pain    a. GXT myoview 4/12: no isch., EF 86%;   b. echo 4/12: EF 55-65%, grade 1 diast dysfxn, LAE  . Depression with anxiety   . GERD (gastroesophageal reflux disease)   . Hemochromatosis    indentified  by the C282Y gene mutation; Dr. Beryle Beams  . History of syncope   . Hypothyroidism     Family History  Problem Relation Age of Onset  . Stroke Mother   . Heart disease Father 65  . Hemochromatosis Brother    Past Surgical History:  Procedure Laterality Date  . MOUTH SURGERY    . SHOULDER ARTHROSCOPY Left   . TOTAL ABDOMINAL HYSTERECTOMY    . VARICOSE VEIN SURGERY     Social History   Social History Narrative   REGULAR EXERCISE     Objective: Vital Signs: BP 116/70 (BP Location: Right Arm)   Resp 14   Wt 145 lb (65.8 kg)   BMI 24.89 kg/m    Physical Exam  Constitutional: She is oriented to person, place, and time. She appears well-developed and well-nourished.  HENT:  Head: Normocephalic and atraumatic.  Eyes: Conjunctivae and EOM are normal.  Neck: Normal range of motion.  Cardiovascular: Normal rate, regular rhythm, normal heart sounds and intact distal pulses.   Pulmonary/Chest: Effort normal and breath sounds normal.  Abdominal: Soft. Bowel sounds are normal.  Lymphadenopathy:    She has no cervical adenopathy.  Neurological: She is alert and oriented to person, place, and time.  Skin: Skin is warm and dry. Capillary refill takes 2 to 3 seconds.  Noted injectates is were noted. No sclerodactyly was noted. No nailbed capillary changes were noted. Raynolds involves her hands only. No digital ulcers were noted.  Psychiatric: She has a normal mood and affect. Her behavior is normal.  Nursing note and vitals reviewed.    Musculoskeletal Exam: C-spine limited lateral rotation, she has limited range of motion of her lumbar spine. Shoulder joints elbow joints wrist joints were good range of motion. She has minimal PIP/DIP thickening. Hip joints knee joints ankles MTPs PIPs with good range of motion with no synovitis.  CDAI Exam: No CDAI exam completed.    Investigation: No additional findings. 10/10/2016 CBC normal, CMP normal, 02/18/2016 ANA negative, RF  negative, CCP negative, Ro negative, La negative, SCL 70 positive, magnesium normal  Imaging: No results found.  Speciality Comments: No specialty comments available.    Procedures:  No procedures performed Allergies: Patient has no known allergies.   Assessment / Plan:     Visit Diagnoses: Pulmonary emphysema with fibrosis of lung ([place]) - Seen by Dr. Chase Caller  [name] phenomenon without gangrene -patient states that she's been having more problems with Raynauds in her hands. She is on beta blocker which probably is needed for her atrial fibrillation. Beta blockers can make the Raynauds worse. And calcium channel blockers can help. She will have to discuss this further with her cardiologist. Topical nitroglycerin can be tried as well. Plan: Anti-DNA antibody, double-stranded, Anti-Smith antibody, Cardiolipin antibodies, IgG, IgM, IgA, Lupus anticoagulant panel, Beta-2 glycoprotein antibodies, Pan-ANCA, Cryoglobulin  Abnormal laboratory test - SCL 70 positive. She has no clinical findings of scleroderma on examination today.  Spondylosis of lumbar region without myelopathy or radiculopathy: She's been having increased discomfort in her lower back. She states that Flexeril makes her sleepy and she would like a prescription for Robaxin. I'll give her  a short-term prescription. Robaxin 500 mg by mouth daily at bedtime when necessary total 30 tablets were given. I've advised her not to take it with Flexeril. Side effects were reviewed.  Irritable larynx  Stopped smoking with greater than 40 pack year history  Hereditary hemochromatosis ([place]) - Followed up by Dr. Waymon Budge, treated with phlebotomies in the past  Paroxysmal atrial fibrillation ([place]) - Followed up by Dr. Lequita Halt - Plan: Lupus anticoagulant panel  Acquired hypothyroidism  SCL-70 antibody positive - Plan: Anti-DNA antibody, double-stranded, Anti-Smith antibody, Cardiolipin antibodies, IgG, IgM, IgA, Lupus anticoagulant  panel, Beta-2 glycoprotein antibodies, Pan-ANCA, Cryoglobulin    Orders: Orders Placed This Encounter  Procedures  . Anti-DNA antibody, double-stranded  . Anti-Smith antibody  . Cardiolipin antibodies, IgG, IgM, IgA  . Lupus anticoagulant panel  . Beta-2 glycoprotein antibodies  . Pan-ANCA  . Cryoglobulin   Meds ordered this encounter  Medications  . methocarbamol (ROBAXIN) 500 MG tablet    Sig: Take 1 tablet (500 mg total) by mouth at bedtime as needed for muscle spasms.    Dispense:  30 tablet    Refill:  0    Face-to-face time spent with patient was 50 minutes. 50% of time was spent in counseling and coordination of care.  Follow-Up Instructions: Return for Raynauds.   Bo Merino, MD  Note - This record has been created using Editor, commissioning.  Chart creation errors have been sought, but may not always  have been located. Such creation errors do not reflect on  the standard of medical care.

## 2016-10-17 NOTE — Telephone Encounter (Signed)
Pt called / informed "labs good. Ferritin 15. Liver tests normal" per Dr Beryle Beams. Stated very good and will see [name] on Monday.

## 2016-10-20 ENCOUNTER — Ambulatory Visit (INDEPENDENT_AMBULATORY_CARE_PROVIDER_SITE_OTHER): Payer: Medicare Other | Admitting: Rheumatology

## 2016-10-20 ENCOUNTER — Encounter: Payer: Self-pay | Admitting: Rheumatology

## 2016-10-20 VITALS — BP 116/70 | Resp 14 | Wt 145.0 lb

## 2016-10-20 DIAGNOSIS — R899 Unspecified abnormal finding in specimens from other organs, systems and tissues: Secondary | ICD-10-CM | POA: Diagnosis not present

## 2016-10-20 DIAGNOSIS — J8410 Pulmonary fibrosis, unspecified: Secondary | ICD-10-CM | POA: Diagnosis not present

## 2016-10-20 DIAGNOSIS — E039 Hypothyroidism, unspecified: Secondary | ICD-10-CM

## 2016-10-20 DIAGNOSIS — J387 Other diseases of larynx: Secondary | ICD-10-CM | POA: Diagnosis not present

## 2016-10-20 DIAGNOSIS — I7300 Raynaud's syndrome without gangrene: Secondary | ICD-10-CM

## 2016-10-20 DIAGNOSIS — I480 Paroxysmal atrial fibrillation: Secondary | ICD-10-CM

## 2016-10-20 DIAGNOSIS — J439 Emphysema, unspecified: Secondary | ICD-10-CM | POA: Diagnosis not present

## 2016-10-20 DIAGNOSIS — R768 Other specified abnormal immunological findings in serum: Secondary | ICD-10-CM

## 2016-10-20 DIAGNOSIS — Z87891 Personal history of nicotine dependence: Secondary | ICD-10-CM | POA: Diagnosis not present

## 2016-10-20 DIAGNOSIS — M47816 Spondylosis without myelopathy or radiculopathy, lumbar region: Secondary | ICD-10-CM

## 2016-10-20 MED ORDER — METHOCARBAMOL 500 MG PO TABS: 500.0000 mg | ORAL_TABLET | Freq: Every evening | ORAL | 0 refills | Status: DC | PRN

## 2016-10-21 LAB — ANTI-DNA ANTIBODY, DOUBLE-STRANDED: DS DNA AB: 2 [IU]/mL

## 2016-10-21 LAB — BETA-2 GLYCOPROTEIN ANTIBODIES
Beta-2 Glyco I IgG: <9 SGU (ref <=20)
Beta-2-Glycoprotein I IgA: <9 SAU (ref <=20)
Beta-2-Glycoprotein I IgM: 31 SMU — ABNORMAL HIGH (ref <=20)

## 2016-10-21 LAB — CARDIOLIPIN ANTIBODIES, IGG, IGM, IGA
ANTICARDIOLIPIN IGA: 12 [APL'U] — AB
Anticardiolipin IgM: 47 [MPL'U] — ABNORMAL HIGH

## 2016-10-21 LAB — PAN-ANCA: ANCA SCREEN: NEGATIVE

## 2016-10-21 LAB — ANTI-SMITH ANTIBODY: ENA SM Ab Ser-aCnc: <1

## 2016-10-23 LAB — RFX PTT-LA W/RFX TO HEX PHASE CONF: PTT-LA SCREEN: 77 s — AB (ref <=40)

## 2016-10-23 LAB — LUPUS ANTICOAGULANT PANEL

## 2016-10-23 LAB — RFX DRVVT SCR W/RFLX CONF 1:1 MIX: DRVVT SCREEN: 102 s — AB (ref <=45)

## 2016-10-23 LAB — RFLX HEXAGONAL PHASE CONFIRM: Hexagonal Phase Confirm: NEGATIVE

## 2016-10-23 LAB — RFLX DRVVT CONFRIM: DRVVT CONFIRMATION: NEGATIVE

## 2016-10-24 ENCOUNTER — Encounter: Payer: Medicare Other | Admitting: Oncology

## 2016-10-25 ENCOUNTER — Ambulatory Visit (INDEPENDENT_AMBULATORY_CARE_PROVIDER_SITE_OTHER): Payer: Medicare Other | Admitting: Oncology

## 2016-10-25 ENCOUNTER — Encounter: Payer: Self-pay | Admitting: Oncology

## 2016-10-25 DIAGNOSIS — Z7901 Long term (current) use of anticoagulants: Secondary | ICD-10-CM

## 2016-10-25 DIAGNOSIS — E039 Hypothyroidism, unspecified: Secondary | ICD-10-CM

## 2016-10-25 DIAGNOSIS — I480 Paroxysmal atrial fibrillation: Secondary | ICD-10-CM

## 2016-10-25 DIAGNOSIS — Z79899 Other long term (current) drug therapy: Secondary | ICD-10-CM | POA: Diagnosis not present

## 2016-10-25 DIAGNOSIS — Z832 Family history of diseases of the blood and blood-forming organs and certain disorders involving the immune mechanism: Secondary | ICD-10-CM

## 2016-10-25 DIAGNOSIS — I7300 Raynaud's syndrome without gangrene: Secondary | ICD-10-CM

## 2016-10-25 DIAGNOSIS — Z87891 Personal history of nicotine dependence: Secondary | ICD-10-CM

## 2016-10-25 DIAGNOSIS — D510 Vitamin B12 deficiency anemia due to intrinsic factor deficiency: Secondary | ICD-10-CM

## 2016-10-25 NOTE — Progress Notes (Signed)
Hematology and Oncology Follow Up Visit  LIZVET CHUNN [id number] 02/18/1941 76 y.o. 10/25/2016 1:49 PM   Principle Diagnosis: Encounter Diagnoses  Name Primary?  . Hereditary hemochromatosis ([place]) Yes  . Hereditary hemochromatosis ([place])   . Pernicious anemia   Clinical summary: 76 year old woman who is a homozygote for the C282Y hemachromatosis gene. She has an interesting family history since the gene is dominant but only variably expressed in different family members. One of her brothers had clinical hemachromatosis and developed end-stage liver disease requiring transplant and ultimately died of complications of his disease. Another brother is affected and on a phlebotomy program. A third brother is a heterozygote. All 3 of her daughters are affected. All are homozygotes. 2 of3 have not required phlebotomies despite homozygous status. Mrs. Mahmood has only required a rare phlebotomy since her ferritin levels run low. She initially required phlebotomy December 2010 at which time the ferritin was 248. She was started on monthly phlebotomy program. Ferritin was down to 58 by 02/01/2010. Interval between phlebotomies was decreased every 3 months. In October 2012 the ferritin was down to 10. She was last phlebotomized in January 2013. Ferritin  consistently low since that time and she has not required subsequent phlebotomy. She has a remote diagnosis of pernicious anemia and is still getting monthly B12 injections.  Her ferritin levels remain extremely low and she has not required phlebotomy.  Interim History:   Overall doing well.  She recently saw Dr. Chase Caller, pulmonary critical care medicine, for a persistent nonproductive cough.  She had a high resolution CT scan of the chest in August and again in December 2017.  A number of subcentimeter pulmonary nodules are seen and unchanged.  Subtle changes at the lung bases suggestive of early or mild interstitial lung disease.  Mild to moderate  changes of emphysema.  A number of laboratory tests were done to look for autoimmune lung disease.  ANA, rheumatoid factor, CCP antibodies negative.  Sjogren antibodies negative.  Antibodies to scleroderma SCL-70 was positive at 6.4 normal less than 1.  She was referred to Dr. Estanislado Pandy, rheumatology.  She found no evidence for clinical scleroderma.  Additional tests are in progress.  2 of her 3 daughters are having significant health issues.  One has developed a stress related movement disorder.  She just divorced her husband.  The other daughter Larene Beach has severe chronic fatigue syndrome and also what sounds like a chronic idiopathic autonomic neuropathy.  Both of these daughters are now back home living with her.  Medications: reviewed  Allergies: No Known Allergies  Review of Systems: See interim history Remaining ROS negative:   Physical Exam: Blood pressure 127/65, pulse (!) 54, temperature 97.5 F (36.4 C), temperature source Oral, height 5\' 4"  (1.626 m), weight 143 lb 11.2 oz (65.2 kg), SpO2 98 %. Wt Readings from Last 3 Encounters:  10/25/16 143 lb 11.2 oz (65.2 kg)  10/20/16 145 lb (65.8 kg)  08/31/16 143 lb 3.2 oz (65 kg)     General appearance: Well-nourished Caucasian woman HENNT: Pharynx no erythema, exudate, mass, or ulcer. No thyromegaly or thyroid nodules Lymph nodes: No cervical, supraclavicular, or axillary lymphadenopathy Breasts: Lungs: Clear to auscultation, resonant to percussion throughout Heart: Regular rhythm, no murmur, no gallop, no rub, no click, no edema Abdomen: Soft, nontender, normal bowel sounds, no mass, no organomegaly Extremities: No edema, no calf tenderness Musculoskeletal: no joint deformities GU:  Vascular: Carotid pulses 2+, no bruits, Neurologic: Alert, oriented, PERRLA, cranial nerves grossly normal, motor  strength 5 over 5, reflexes 1+ symmetric, upper body coordination normal, gait normal, Skin: No rash or ecchymosis  Lab Results: CBC  W/Diff    Component Value Date/Time   WBC 5.0 10/10/2016 0839   WBC 4.2 06/02/2016 0600   RBC 3.83 10/10/2016 0839   RBC 3.56 (L) 06/02/2016 0600   HGB 11.5 (L) 06/02/2016 0600   HGB 12.3 09/17/2012 1136   HCT 37.6 10/10/2016 0839   HCT 36.3 09/17/2012 1136   PLT 244 10/10/2016 0839   MCV 98 (H) 10/10/2016 0839   MCV 97.2 09/17/2012 1136   MCH 32.4 10/10/2016 0839   MCH 32.3 06/02/2016 0600   MCHC 33.0 10/10/2016 0839   MCHC 33.1 06/02/2016 0600   RDW 14.9 10/10/2016 0839   RDW 15.0 (H) 09/17/2012 1136   LYMPHSABS 1.3 10/10/2016 0839   LYMPHSABS 1.5 09/17/2012 1136   MONOABS 0.4 06/02/2016 0600   MONOABS 0.4 09/17/2012 1136   EOSABS 0.4 10/10/2016 0839   BASOSABS 0.1 10/10/2016 0839   BASOSABS 0.1 09/17/2012 1136     Chemistry      Component Value Date/Time   NA 141 10/10/2016 0839   NA 140 09/17/2012 1136   K 4.3 10/10/2016 0839   K 3.8 09/17/2012 1136   CL 101 10/10/2016 0839   CL 105 09/17/2012 1136   CO2 25 10/10/2016 0839   CO2 26 09/17/2012 1136   BUN 11 10/10/2016 0839   BUN 14.3 09/17/2012 1136   CREATININE 0.74 10/10/2016 0839   CREATININE 0.54 09/29/2014 1104   CREATININE 0.7 09/17/2012 1136      Component Value Date/Time   CALCIUM 9.7 10/10/2016 0839   CALCIUM 9.1 09/17/2012 1136   ALKPHOS 68 10/10/2016 0839   ALKPHOS 61 09/17/2012 1136   AST 19 10/10/2016 0839   AST 23 09/17/2012 1136   ALT 12 10/10/2016 0839   ALT 21 09/17/2012 1136   BILITOT 0.4 10/10/2016 0839   BILITOT 0.37 09/17/2012 1136    Ferritin 15 on October 10, 2016 Repeat CBC done on April 16 shows hemoglobin which had dipped slightly to 11.5, now back to her chronic baseline at 12.4 with MCV 98.  Normal white count, differential, and platelet count.   Radiological Studies: No results found.  Impression:  #1.  Homozygous for the hemochromatosis gene C282Y without ever developing clinical hemochromatosis.  Chronically low ferritin levels not requiring phlebotomy in many years. I  will continue annual follow-up.  2.  Pernicious anemia on chronic B12 injections  3.  Paroxysmal atrial fibrillation now on chronic anticoagulation  4.  Hypothyroid on replacement  5.  [name] without any clinical signs or symptoms of a collagen vascular disorder  CC: Patient Care Team: Aura Dials, PA-C as PCP - General (Physician Assistant) Annia Belt, MD as Consulting Physician (Hematology) Deboraha Sprang, MD as Consulting Physician (Cardiology)   Murriel Hopper, MD, [place]  Hematology-Oncology/Internal Medicine     5/1/20181:49 PM

## 2016-10-25 NOTE — Patient Instructions (Signed)
Return in 1 year Lab 1-2 weeks before visit

## 2016-10-27 LAB — CRYOGLOBULIN

## 2016-10-27 NOTE — Progress Notes (Signed)
Will discuss at fu

## 2016-10-28 ENCOUNTER — Telehealth: Payer: Self-pay | Admitting: Rheumatology

## 2016-10-28 NOTE — Telephone Encounter (Signed)
Patient called to see if she could get results from last labs. Please call patients mobile if no answer at home number.

## 2016-10-28 NOTE — Telephone Encounter (Signed)
Patient advised her labs would be discussed at her new patient follow up. Patient verbalized understanding.

## 2016-11-10 DIAGNOSIS — Z8639 Personal history of other endocrine, nutritional and metabolic disease: Secondary | ICD-10-CM | POA: Insufficient documentation

## 2016-11-10 DIAGNOSIS — R768 Other specified abnormal immunological findings in serum: Secondary | ICD-10-CM | POA: Insufficient documentation

## 2016-11-10 NOTE — Progress Notes (Signed)
Office Visit Note  Patient: [name]             Date of Birth: 01-16-1941           MRN: [id number]             PCP: Chesley Noon, MD Referring: Aura Dials, PA-C Visit Date: 11/18/2016 Occupation: @GUAROCC @    Subjective:  Raynauds   History of Present Illness: [name] is a 76 y.o. female with history of idiopathic pulmonary fibrosis. She continues to have mild shortness of breath is not worsening. She continues to have [name] phenomenon which is quite severe. She denies any digital ulcers but her hands get numb. She's been having some stiffness in her C-spine. She will be seeing Dr. Deatra Robinson for that. Her lower back pain is tolerable. She describes some discomfort in her bilateral knee joints. She also has discomfort in the right trochanteric bursa area especially at nighttime.  Activities of Daily Living:  Patient reports morning stiffness for 2 minutes.   Patient Reports nocturnal pain.  Difficulty dressing/grooming: Denies Difficulty climbing stairs: Denies Difficulty getting out of chair: Denies Difficulty using hands for taps, buttons, cutlery, and/or writing: Denies   Review of Systems  Constitutional: Positive for fatigue. Negative for night sweats, weight gain, weight loss and weakness.  HENT: Negative for mouth sores, trouble swallowing, trouble swallowing, mouth dryness and nose dryness.   Eyes: Negative for pain, redness, visual disturbance and dryness.  Respiratory: Positive for shortness of breath. Negative for cough and difficulty breathing.   Cardiovascular: Positive for palpitations. Negative for chest pain, hypertension, irregular heartbeat and swelling in legs/feet.  Gastrointestinal: Negative for blood in stool, constipation and diarrhea.  Endocrine: Negative for increased urination.  Genitourinary: Negative for vaginal dryness.  Musculoskeletal: Positive for arthralgias, joint pain and morning stiffness. Negative for joint  swelling, myalgias, muscle weakness, muscle tenderness and myalgias.  Skin: Positive for color change. Negative for rash, hair loss, skin tightness, ulcers and sensitivity to sunlight.  Allergic/Immunologic: Negative for susceptible to infections.  Neurological: Negative for dizziness, memory loss and night sweats.  Hematological: Negative for swollen glands.  Psychiatric/Behavioral: Positive for sleep disturbance. Negative for depressed mood. The patient is not nervous/anxious.     PMFS History:  Patient Active Problem List   Diagnosis Date Noted  . Abnormal laboratory test 11/18/2016  . DDD L spine 11/18/2016  . SCL-70 antibody positive 11/10/2016  . History of hypothyroidism 11/10/2016  . History of hemochromatosis 11/10/2016  . Pulmonary emphysema with fibrosis of lung ([place]) 08/31/2016  . [name] phenomenon (by history or observed) 08/31/2016  . Sinusitis, chronic 08/31/2016  . Chronic cough 02/10/2016  . Irritable larynx 02/10/2016  . Stopped smoking with greater than 40 pack year history 02/10/2016  . Chest pain, unspecified 10/06/2010  . Shortness of breath 10/06/2010  . Hypothyroidism   . Hereditary hemochromatosis ([place]) 04/22/2010  . ATRIAL FIBRILLATION 10/20/2008  . [facility] 10/20/2008  . SYNCOPE AND COLLAPSE 10/20/2008    Past Medical History:  Diagnosis Date  . Atrial fibrillation ([place])    a. Flecainide therapy;  b. event monitor 4/12  . Chest pain    a. GXT myoview 4/12: no isch., EF 86%;   b. echo 4/12: EF 55-65%, grade 1 diast dysfxn, LAE  . Depression with anxiety   . GERD (gastroesophageal reflux disease)   . 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 Beryle Beams  . History of syncope   .  Hypothyroidism     Family History  Problem Relation Age of Onset  . Stroke Mother   . Heart disease Father 3  . Hemochromatosis Brother    Past Surgical History:  Procedure Laterality Date  . MOUTH SURGERY    . SHOULDER ARTHROSCOPY Left   .  TOTAL ABDOMINAL HYSTERECTOMY    . VARICOSE VEIN SURGERY     Social History   Social History Narrative   REGULAR EXERCISE     Objective: Vital Signs: BP 120/70   Pulse 74   Resp 14   Wt 148 lb (67.1 kg)   BMI 25.40 kg/m    Physical Exam  Constitutional: She is oriented to person, place, and time. She appears well-developed and well-nourished.  HENT:  Head: Normocephalic and atraumatic.  Eyes: Conjunctivae and EOM are normal.  Neck: Normal range of motion.  Cardiovascular: Normal rate, regular rhythm, normal heart sounds and intact distal pulses.   Pulmonary/Chest: Effort normal and breath sounds normal.  Abdominal: Soft. Bowel sounds are normal.  Lymphadenopathy:    She has no cervical adenopathy.  Neurological: She is alert and oriented to person, place, and time.  Skin: Skin is warm and dry. Capillary refill takes less than 2 seconds.  No sclerodactyly, no Telengectesia were noted  Psychiatric: She has a normal mood and affect. Her behavior is normal.  Nursing note and vitals reviewed.    Musculoskeletal Exam: C-spine limited range of motion especially with lateral rotation to the left. Thoracic and lumbar spine good range of motion. Shoulder joints elbow joints wrist joint MCPs PIPs DIPs with good range of motion with no synovitis. Hip joints knee joints ankles MTPs PIPs DIPs with good range of motion with no synovitis. She has minimal thickening of bilateral PIP/DIP joints consistent with osteoarthritis.  CDAI Exam: No CDAI exam completed.    Investigation: Findings:  10/20/2016 Anti-DNA antibody ds normal; Anti-Smith antibody negative;Cardiolipin antibodies, IgG normal less than 14.,IgM 47 (elevated) , IgA 12 High , Lupus anticoagulant panel lupus anticoagulant not detected , Beta-2 glycoprotein antibodies Beta-2-Glycoprotein I IgM Elevated 31 IgA and IgG normal less than 9, Pan-ANCA normal ; and Cryoglobulin not detected.   06/02/2016 CBC hemoglobin 11.5, BMP  normal, ANA negative, RF negative, anti-CCP negative, Ro negative,La Negative, SCL 70 +6.4  10/10/2016 CBC normal, CMP normal     Imaging: No results found.  Speciality Comments: No specialty comments available.    Procedures:  No procedures performed Allergies: Patient has no known allergies.   Assessment / Plan:     Visit Diagnoses: [name] phenomenon without gangrene: Her renal does not very active currently. Although she states she has episodes of increased pain and discomfort from Raynauds. She's also on beta blockers which continues to worsening of [name]. She has positive anticardiolipin and positive IgM which can predispose to Raynauds as well. She is on anticoagulants no additional therapy is needed.  Pulmonary emphysema with fibrosis of the lung. She's SCL 70 positive. She has no clinical features of scleroderma at this point. We will continue to follow her. Besides SCL 70 wrist the ENA panel and ANA was negative. Dr. Chase Caller plans to do repeat CT chest in future.  Bilateral hand pain: She has mild osteoarthritis  Abnormal laboratory test - Positive anticardiolipin IgM 47, positive beta-2 IgM 31  Trochanteric bursitis of right hip - Plan: Ambulatory referral to Physical Therapy  Myalgia  C-spine discomfort. She will be seeing Dr. Ellene Route from that.  DDD L spine: Chronic pain  History  of hemochromatosis: Followed up by hematology  Other chronic sinusitis  History of atrial fibrillation  History of hypothyroidism  History of emphysema  Stopped smoking with greater than 40 pack year history   Orders: Orders Placed This Encounter  Procedures  . Ambulatory referral to Physical Therapy   No orders of the defined types were placed in this encounter.   Face-to-face time spent with patient was 30 minutes. 50% of time was spent in counseling and coordination of care.  Follow-Up Instructions: Return in about 6 months (around 05/21/2017) for raynauds, Scl  70+, PF.   Bo Merino, MD  Note - This record has been created using Editor, commissioning.  Chart creation errors have been sought, but may not always  have been located. Such creation errors do not reflect on  the standard of medical care.

## 2016-11-18 ENCOUNTER — Encounter: Payer: Self-pay | Admitting: Rheumatology

## 2016-11-18 ENCOUNTER — Ambulatory Visit (INDEPENDENT_AMBULATORY_CARE_PROVIDER_SITE_OTHER): Payer: Medicare Other | Admitting: Rheumatology

## 2016-11-18 VITALS — BP 120/70 | HR 74 | Resp 14 | Wt 148.0 lb

## 2016-11-18 DIAGNOSIS — I7300 Raynaud's syndrome without gangrene: Secondary | ICD-10-CM | POA: Diagnosis not present

## 2016-11-18 DIAGNOSIS — M79642 Pain in left hand: Secondary | ICD-10-CM

## 2016-11-18 DIAGNOSIS — J328 Other chronic sinusitis: Secondary | ICD-10-CM | POA: Diagnosis not present

## 2016-11-18 DIAGNOSIS — M7061 Trochanteric bursitis, right hip: Secondary | ICD-10-CM | POA: Diagnosis not present

## 2016-11-18 DIAGNOSIS — Z8639 Personal history of other endocrine, nutritional and metabolic disease: Secondary | ICD-10-CM | POA: Diagnosis not present

## 2016-11-18 DIAGNOSIS — M791 Myalgia, unspecified site: Secondary | ICD-10-CM

## 2016-11-18 DIAGNOSIS — R899 Unspecified abnormal finding in specimens from other organs, systems and tissues: Secondary | ICD-10-CM | POA: Insufficient documentation

## 2016-11-18 DIAGNOSIS — M79641 Pain in right hand: Secondary | ICD-10-CM | POA: Diagnosis not present

## 2016-11-18 DIAGNOSIS — Z8709 Personal history of other diseases of the respiratory system: Secondary | ICD-10-CM | POA: Diagnosis not present

## 2016-11-18 DIAGNOSIS — J84112 Idiopathic pulmonary fibrosis: Secondary | ICD-10-CM | POA: Insufficient documentation

## 2016-11-18 DIAGNOSIS — Z87891 Personal history of nicotine dependence: Secondary | ICD-10-CM

## 2016-11-18 DIAGNOSIS — Z8679 Personal history of other diseases of the circulatory system: Secondary | ICD-10-CM | POA: Diagnosis not present

## 2016-11-18 DIAGNOSIS — M47816 Spondylosis without myelopathy or radiculopathy, lumbar region: Secondary | ICD-10-CM | POA: Insufficient documentation

## 2016-11-18 DIAGNOSIS — J439 Emphysema, unspecified: Secondary | ICD-10-CM

## 2016-11-18 DIAGNOSIS — J8410 Pulmonary fibrosis, unspecified: Secondary | ICD-10-CM

## 2016-11-28 ENCOUNTER — Ambulatory Visit (INDEPENDENT_AMBULATORY_CARE_PROVIDER_SITE_OTHER): Payer: Medicare Other | Admitting: Internal Medicine

## 2016-11-28 ENCOUNTER — Telehealth: Payer: Self-pay | Admitting: Internal Medicine

## 2016-11-28 ENCOUNTER — Encounter: Payer: Self-pay | Admitting: Internal Medicine

## 2016-11-28 VITALS — BP 130/70 | HR 59 | Ht 63.75 in | Wt 145.0 lb

## 2016-11-28 DIAGNOSIS — Z87891 Personal history of nicotine dependence: Secondary | ICD-10-CM | POA: Diagnosis not present

## 2016-11-28 DIAGNOSIS — J439 Emphysema, unspecified: Secondary | ICD-10-CM

## 2016-11-28 DIAGNOSIS — R768 Other specified abnormal immunological findings in serum: Secondary | ICD-10-CM

## 2016-11-28 DIAGNOSIS — J8410 Pulmonary fibrosis, unspecified: Secondary | ICD-10-CM | POA: Diagnosis not present

## 2016-11-28 DIAGNOSIS — I7300 Raynaud's syndrome without gangrene: Secondary | ICD-10-CM | POA: Diagnosis not present

## 2016-11-28 DIAGNOSIS — J849 Interstitial pulmonary disease, unspecified: Secondary | ICD-10-CM | POA: Diagnosis not present

## 2016-11-28 DIAGNOSIS — R918 Other nonspecific abnormal finding of lung field: Secondary | ICD-10-CM | POA: Insufficient documentation

## 2016-11-28 LAB — PULMONARY FUNCTION TEST
DL/VA % PRED: 57 %
DL/VA: 2.75 ml/min/mmHg/L
DLCO COR % PRED: 55 %
DLCO cor: 13.36 ml/min/mmHg
DLCO unc % pred: 54 %
DLCO unc: 12.93 ml/min/mmHg
FEF 25-75 Pre: 0.87 L/sec
FEF2575-%Pred-Pre: 53 %
FEV1-%Pred-Pre: 87 %
FEV1-PRE: 1.82 L
FEV1FVC-%Pred-Pre: 86 %
FEV6-%Pred-Pre: 104 %
FEV6-Pre: 2.74 L
FEV6FVC-%Pred-Pre: 102 %
FVC-%PRED-PRE: 101 %
FVC-PRE: 2.81 L
Pre FEV1/FVC ratio: 65 %
Pre FEV6/FVC Ratio: 98 %

## 2016-11-28 NOTE — Patient Instructions (Addendum)
ICD-9-CM ICD-10-CM   1. ILD (interstitial lung disease) ([place]) 515 J84.9   2. [name] phenomenon without gangrene 443.0 I73.00   3. Multiple lung nodules on CT 793.19 R91.8   4. Pulmonary emphysema, unspecified emphysema type ([place]) 492.8 J43.9   5. Stopped smoking with greater than 40 pack year history V15.82 Z87.891   6. SCL-70 antibody positive 795.79 R76.8     Clinically and PFT stable with dyspnea but improved cough Suspect you have smokers emphysema and auto-immune ILD   Plan coontinue daily spiriva or incruse continu daily treadmill exercise but monitor your pulse ox and ensure > 88% Have written to dr  Caryl Comes about changing beta blocker to calcium channel blocker Flu shot in fall Repeat in dec 2018; Pre-bd spiro and dlco only. No lung volume or bd response.  Repeat in dec 2018: HRCT supine and prone   Followup Dec 2018 after ct and pft Followup dec 2018

## 2016-11-28 NOTE — Addendum Note (Signed)
Addended by: Collier Salina on: 11/28/2016 12:34 PM   Modules accepted: Orders

## 2016-11-28 NOTE — Telephone Encounter (Signed)
HI Lillia Mountain will be seeing [name] later in month I believe: she has raynaud and her rheum MD wishes for her to be off beta blocker. Would calcium channel blocker be possible? Ok to address at your OV  Thanks  Dr. Brand Males, M.D., [facility].C.P Pulmonary and Critical Care Medicine Staff Physician [place] [facility] Pager: [phone], If no answer or between  15:00h - 7:00h: call [phone]  11/28/2016 12:21 PM

## 2016-11-28 NOTE — Progress Notes (Signed)
PFT done today. 

## 2016-11-28 NOTE — Progress Notes (Signed)
Subjective:     Patient ID: [name], female   DOB: 1940-10-15, 76 y.o.   MRN: [id number]  HPI     PCP [name],[name], PA-C   HPI  IOV 02/10/2016  Chief Complaint  Patient presents with  . Pulmonary Consult    Pt referred by Dr. Melford Aase for chronic cough x 1 year. Pt states she feels she has a tickle that is causing her dry cough. Pt states she has DOE when climbing stairs. Pt deneis CP/tightness.     76 year old female with hemochromatosis homozygous gene followed by Dr. Beryle Beams (last phlebotomy many years ago and is on serial monitoring) with children and siblings with active disease. She has also atrial fibrillation followed by Dr. Caryl Comes. Reports insidious onset of chronic cough in the last 1 year. It is stable since onset. It fluctuates between mild and severe in severity. It is mostly dry in quality. Mostly present in the daytime but sometimes also wakes her up at night. It is definitely not progressive. It is episodic and present every day. Aggravated by talking sometimes and then the throat feels dry associated with tickle in the throat and also sensation the cough is coming from the upper chest retrosternally and sometimes relieved by drinking water or chewing on a lozenge. Also aggravated by seasonal changes particularly in the spring and the fall which makes her think she has allergies. There is sometimes associated gag. She also reports nonspecific occasional wheezing and associated shortness of breath that is nonspecific but present with exertion and relieved by rest No other clear cut aggravating or relieving factors.   cough associated history  - Medications: She is not on fish oil or ACE inhibitors - Sinus drainage: She does admit to spring allergies. She did have something removed from her hard palate several years ago by ENT does not know details - Acid reflux: She says she is significant acid reflux and is on Prilosec. Without which she'll have significant  symptoms - Pulmonary disease: FenO 12 02/10/2016  and normal. She denies any personal history of asthma or pulmonary fibrosis of COPD or emphysema smoked one pack per day started smoking at age 21 and quit in 1999. Making a 42 pack smoking history. Chest x-ray 08/26/2014 personally visualized is clear - Tobacco :  reports that she quit smoking about 18 years ago. She has a 42.00 pack-year smoking history. She has never used smokeless tobacco.       OV 02/18/2016  Chief Complaint  Patient presents with  . Follow-up    Pt here after PFT and HRCT. Pt denies changes in SOB and cough. Pt denies any new complaints at this time.    Follow-up chronic cough. This visit is to follow-up on test results of function test and high resolution CT chest which are described below9 results show isolated reduction in diffusion capacity which can be explained by possible early ILD and also emphysema. There are also new findings of nodule 6 mm bilaterally on lower lobes. She prefers a very cautious approach to this evaluation.     Pulmonary function test 02/12/2016 - FVC 2.8 cm/101%, FEV1 1.9 L/91%. Ratio 60/90%. Total lung capacity 115%. DLCO reduced at 14.5/60%. She is isolated reduction in diffusion capacity    HRCT chest 02/16/16 IMPRESSION: 1. Mild patchy subpleural reticulation in both lungs with a basilar predominance. No significant traction bronchiectasis. No frank honeycombing. Findings could represent an interstitial lung disease such as nonspecific interstitial pneumonia (NSIP), with early usual interstitial pneumonia (  UIP) not excluded. A follow-up high-resolution chest CT study in 12 months is recommended to assess temporal pattern stability. 2. Bilateral lower lobe solid pulmonary nodules, largest 6 mm. Non-contrast chest CT at 3-6 months is recommended. If the nodules are stable at time of repeat CT, then future CT at 18-24 months (from today's scan) is considered optional for low-risk  patients, but is recommended for high-risk patients. This recommendation follows the consensus statement: Guidelines for Management of Incidental Pulmonary Nodules Detected on CT Images:From the Fleischner Society 2017; published online before print (10.1148/radiol.1610960454). 3. Additional findings include mild-to-moderate centrilobular emphysema, aortic atherosclerosis and 2 vessel coronary atherosclerosis.   Electronically Signed   By: Ilona Sorrel M.D.   On: 02/16/2016 14:14   OV 08/31/2016  Chief Complaint  Patient presents with  . Follow-up    HRCT was never scheduled. Pt states that the cough is still present >> slightly improved since last OV. Pt states that she feels her allergies has gotten it ramped up again.    Follow-up multifactorial cough associated with autoimmune antibody positive, interstitial lung disease and emphysema previous history of smoking  After last visit in August 2017 she had high resolution CT chest in December 2017 that showed persistence of ILD. I personally visualized the CT chest. He comes for follow-up. She tells me that since August 2017 she has insidious onset of shortness of breath and is progressively worse. It is mild and present only on inclines is not therefore tenderness. In terms of her cough is persistent. It is associated with significant sinus drainage that she thinks is allergy related. She constantly clears her throat. Lab review shows autoimmune antibody positive in August 2017 with scleroderma antibody slightly elevated at 6.4. I referred her to rheumatology but she does not remember this and she did not make this follow-up. She also tells me that she definitely has a long history of [name] phenomena in her fingers but no one has ever formally diagnosed with connective tissu  e disease.  OV 11/28/2016  Chief Complaint  Patient presents with  . Follow-up    Pt here after PFT. Pt states her breathing is uchanged since last OV. Pt  c/o dry cough.- pt states this has slightly improved since last OV. Pt denies CP/tightness and f/c/s.     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 Since then she has seen rheumatologis Dr. Keturah Barre. I reviewd the notes and also discussed with the patient wa a good understading of what s going on. She tells me that some of the lupus antibodies have been positive correlating with Raynaud. Patient is on beta blocker for atrial fbrillation and it is the preference by the rheumatologist Dr. D that she switc to calcium channel blocker. She has a follow-up appointment pending with her electrophysiologist Dr. Caryl Comes.  She is scleroderma antibody positive but according to her rheumtolgist she does not have dermatologic  Manifestation of the disese. 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 She pulm  function test today and this is stable. In terms of arrest or symptoms she stable. The cough is actually improved. But she does get dyspneic walking up stairs especially a few flights. She does not wnt her to pulmonary habilitation because se exrcises on the treadmill although she does not monitor her saturations.    Dr Lorenza Cambridge Reflux Symptom Index (>  13-15 suggestive of LPR cough)  02/10/2016   Hoarseness of problem with voice 0  Clearing  Of Throat 2  Excess throat mucus or feeling of post nasal drip 2  Difficulty swallowing food, liquid or tablets 0  Cough after eating or lying down 1  Breathing difficulties or choking episodes 1  Troublesome or annoying cough 2  Sensation of something sticking in throat or lump in throat 0  Heartburn, chest pain, indigestion, or stomach acid coming up 3  TOTAL 11     Results for [name], [name] (MRN [id number]) as of 11/28/2016 12:08  Ref. Range 02/12/2016 12:57 11/28/2016 10:53  FVC-Pre Latest Units:  L 2.85 2.81  FVC-%Pred-Pre Latest Units: % 101 101  FEV1-Pre Latest Units: L 1.92 1.82  FEV1-%Pred-Pre Latest Units: % 91 87  Pre FEV1/FVC ratio Latest Units: % 68 65    Results for [name], [name] (MRN [id number]) as of 11/28/2016 12:08  Ref. Range 02/12/2016 12:57 11/28/2016 10:53  DLCO cor Latest Units: ml/min/mmHg 14.54 13.36  DLCO cor % pred Latest Units: % 60 55     has a past medical history of Atrial fibrillation ([place]); Chest pain; Depression with anxiety; GERD (gastroesophageal reflux disease); Hemochromatosis; History of syncope; and Hypothyroidism.   reports that she quit smoking about 19 years ago. She has a 42.00 pack-year smoking history. She has never used smokeless tobacco.  Past Surgical History:  Procedure Laterality Date  . MOUTH SURGERY    . SHOULDER ARTHROSCOPY Left   . TOTAL ABDOMINAL HYSTERECTOMY    . VARICOSE VEIN SURGERY      No Known Allergies  Immunization History  Administered Date(s) Administered  . Pneumococcal Conjugate-13 06/12/2014  . Pneumococcal Polysaccharide-23 06/27/2010  . Tdap 03/16/2011  . Zoster 06/27/2010    Family History  Problem Relation Age of Onset  . Stroke Mother   . Heart disease Father 64  . Hemochromatosis Brother      Current Outpatient Prescriptions:  .  Biotin 1000 MCG tablet, Take 1,000 mcg by mouth daily., Disp: , Rfl:  .  Calcium Carbonate 1500 MG TABS, Take 1 tablet by mouth 3 (three) times a week., Disp: , Rfl:  .  cholecalciferol (VITAMIN D) 1000 UNITS tablet, Take 1,000 Units by mouth 3 (three) times a week., Disp: , Rfl:  .  cyclobenzaprine (FLEXERIL) 10 MG tablet, as needed., Disp: , Rfl:  .  flecainide (TAMBOCOR) 100 MG tablet, Take one-half tablet by  mouth in the morning and  one-half tablet by mouth in the evening, Disp: 90 tablet, Rfl: 3 .  fluticasone (FLONASE) 50 MCG/ACT nasal spray, Place 2 sprays into both nostrils daily., Disp: 16 g, Rfl: 5 .  folic acid (FOLVITE) 1 MG tablet, Take 1 mg by mouth  daily.  , Disp: , Rfl:  .  levothyroxine (SYNTHROID, LEVOTHROID) 112 MCG tablet, Take 112 mcg by mouth daily.  , Disp: , Rfl:  .  Magnesium Gluconate 550 MG TABS, Take 30 mg by mouth., Disp: , Rfl:  .  methocarbamol (ROBAXIN) 500 MG tablet, Take 1 tablet (500 mg total) by mouth at bedtime as needed for muscle spasms., Disp: 30 tablet, Rfl: 0 .  metoprolol succinate (TOPROL-XL) 25 MG 24 hr tablet, TAKE 1 TABLET BY MOUTH  DAILY, Disp: 90 tablet, Rfl: 3 .  omeprazole (PRILOSEC) 40 MG capsule, daily., Disp: , Rfl:  .  rivaroxaban (XARELTO) 20 MG TABS tablet, Take one tablet (20 mg) by mouth once daily with food, Disp: 90 tablet,  Rfl: 3 .  Tiotropium Bromide Monohydrate (SPIRIVA RESPIMAT) 2.5 MCG/ACT AERS, Inhale 2 puffs into the lungs daily., Disp: 1 Inhaler, Rfl: 3 .  TOBRADEX ophthalmic ointment, as needed., Disp: , Rfl:  .  VITAMIN B1-B12 IM, Inject 1,000 mcg into the muscle every 30 (thirty) days. , Disp: , Rfl:  .  VITAMIN E PO, Take 1 tablet by mouth 3 (three) times a week., Disp: , Rfl:    Review of Systems     Objective:   Physical Exam  Constitutional: She is oriented to person, place, and time. She appears well-developed and well-nourished. No distress.  HENT:  Head: Normocephalic and atraumatic.  Right Ear: External ear normal.  Left Ear: External ear normal.  Mouth/Throat: Oropharynx is clear and moist. No oropharyngeal exudate.  Eyes: Conjunctivae and EOM are normal. Pupils are equal, round, and reactive to light. Right eye exhibits no discharge. Left eye exhibits no discharge. No scleral icterus.  Neck: Normal range of motion. Neck supple. No JVD present. No tracheal deviation present. No thyromegaly present.  Cardiovascular: Normal rate, regular rhythm, normal heart sounds and intact distal pulses.  Exam reveals no gallop and no friction rub.   No murmur heard. Pulmonary/Chest: Effort normal and breath sounds normal. No respiratory distress. She has no wheezes. She has no  rales. She exhibits no tenderness.  ? Faint crackles at lung base  Abdominal: Soft. Bowel sounds are normal. She exhibits no distension and no mass. There is no tenderness. There is no rebound and no guarding.  Musculoskeletal: Normal range of motion. She exhibits no edema or tenderness.  Lymphadenopathy:    She has no cervical adenopathy.  Neurological: She is alert and oriented to person, place, and time. She has normal reflexes. No cranial nerve deficit. She exhibits normal muscle tone. Coordination normal.  Skin: Skin is warm and dry. No rash noted. She is not diaphoretic. No erythema. No pallor.  Psychiatric: She has a normal mood and affect. Her behavior is normal. Judgment and thought content normal.  Vitals reviewed.  Vitals:   11/28/16 1202  BP: 130/70  Pulse: (!) 59  SpO2: 95%  Weight: 145 lb (65.8 kg)  Height: 5' 3.75" (1.619 m)    Estimated body mass index is 25.08 kg/m as calculated from the following:   Height as of this encounter: 5' 3.75" (1.619 m).   Weight as of this encounter: 145 lb (65.8 kg).     Assessment:       ICD-9-CM ICD-10-CM   1. ILD (interstitial lung disease) ([place]) 515 J84.9   2. [name] phenomenon without gangrene 443.0 I73.00   3. Multiple lung nodules on CT 793.19 R91.8   4. Pulmonary emphysema, unspecified emphysema type ([place]) 492.8 J43.9   5. Stopped smoking with greater than 40 pack year history V15.82 Z87.891   6. SCL-70 antibody positive 795.79 R76.8        Plan:      Clinically and PFT stable with dyspnea but improved cough Suspect you have smokers emphysema and auto-immune ILD   Plan At this point continue support for emphysema with observatio for ILD coontinue daily spiriva or incruse continu daily treadmill exercise but monitor your pulse ox and ensure > 88% Have written to dr  Caryl Comes about changing beta blocker to calcium channel blocker Flu shot in fall Repeat in dec 2018; Pre-bd spiro and dlco only. No lung volume or bd  response.  Repeat in dec 2018: HRCT supine and prone   Followup Dec 2018  after ct and pft Followup  > 50% of this > 25 min visit spent in face to face counseling or coordination of care    Dr. Brand Males, M.D., [facility].C.P Pulmonary and Critical Care Medicine Staff Physician [place] [facility] Pager: [phone], If no answer or between  15:00h - 7:00h: call [phone]  11/28/2016 12:26 PM

## 2016-11-30 NOTE — Addendum Note (Signed)
Addended by: Collier Salina on: 11/30/2016 03:12 PM   Modules accepted: Orders

## 2016-12-02 ENCOUNTER — Ambulatory Visit: Payer: Medicare Other | Admitting: Internal Medicine

## 2016-12-06 ENCOUNTER — Ambulatory Visit (INDEPENDENT_AMBULATORY_CARE_PROVIDER_SITE_OTHER)
Admission: RE | Admit: 2016-12-06 | Discharge: 2016-12-06 | Disposition: A | Discharge status: Home / Self Care | Payer: Medicare Other | Source: Ambulatory Visit | Attending: Internal Medicine | Admitting: Internal Medicine

## 2016-12-06 DIAGNOSIS — J849 Interstitial pulmonary disease, unspecified: Secondary | ICD-10-CM

## 2016-12-22 ENCOUNTER — Telehealth: Payer: Self-pay | Admitting: Internal Medicine

## 2016-12-22 NOTE — Telephone Encounter (Signed)
I wantd her DENA ESPERANZA to have CT dec 2018 wich would have been 6 months from prior visit but instead they did in julne 2018 which is 6 months from prior CT. Do you know why?  Dr. Brand Males, M.D., [facility]C.P Pulmonary and Critical Care Medicine Staff Physician [place] [facility] Pager: [phone], If no answer or between  15:00h - 7:00h: call [phone]  12/22/2016 12:16 PM

## 2016-12-22 NOTE — Telephone Encounter (Signed)
MR, it appears the CT scan had been ordered twice. Once on 11/28/16 and 11/30/16. The order from 11/28/16 has a date of 05/2017 for the scan to be done. The order from 11/30/16 had no date and therefore the PCCs went ahead and scheduled it.

## 2017-01-12 ENCOUNTER — Ambulatory Visit (INDEPENDENT_AMBULATORY_CARE_PROVIDER_SITE_OTHER): Payer: Medicare Other | Admitting: Internal Medicine

## 2017-01-12 ENCOUNTER — Encounter: Payer: Self-pay | Admitting: Internal Medicine

## 2017-01-12 VITALS — BP 106/70 | HR 58 | Ht 64.0 in | Wt 145.0 lb

## 2017-01-12 DIAGNOSIS — I480 Paroxysmal atrial fibrillation: Secondary | ICD-10-CM | POA: Diagnosis not present

## 2017-01-12 DIAGNOSIS — I7300 Raynaud's syndrome without gangrene: Secondary | ICD-10-CM | POA: Diagnosis not present

## 2017-01-12 NOTE — Progress Notes (Signed)
Patient Care Team: Chesley Noon, MD as PCP - General (Family Medicine) Annia Belt, MD as Consulting Physician (Hematology) Deboraha Sprang, MD as Consulting Physician (Cardiology)   HPI  [name] is a 76 y.o. female seen in followup for atrial fibrillation for which he takes flecainide 50 mg twice daily. This occurs  in the context of hemochromatosis.  She is followed for this by Dr. Beryle Beams.   This is stable   Her CHADS-VASc score is 3; age/gender.  She takes Rivaroxaban without problems  Exercise tolerance is good without shortness of breath or chestpain or edema No significant palpitations     She also has been diagnosed as having hemochromatosis identified by the C282Y gene mutation for which she is homozygous, and by , piss poor luck, with her heterozygous spouse, she gave birth to three daughters all of whom are homozygous.   Normal nuclear study 4/15  Date Cr Hgb  4/18 0.74 12.4          Past Medical History:  Diagnosis Date  . Atrial fibrillation ([place])    a. Flecainide therapy;  b. event monitor 4/12  . Chest pain    a. GXT myoview 4/12: no isch., EF 86%;   b. echo 4/12: EF 55-65%, grade 1 diast dysfxn, LAE  . Depression with anxiety   . GERD (gastroesophageal reflux disease)   . 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 Beryle Beams  . History of syncope   . Hypothyroidism     Past Surgical History:  Procedure Laterality Date  . MOUTH SURGERY    . SHOULDER ARTHROSCOPY Left   . TOTAL ABDOMINAL HYSTERECTOMY    . VARICOSE VEIN SURGERY      Current Outpatient Prescriptions  Medication Sig Dispense Refill  . Biotin 1000 MCG tablet Take 1,000 mcg by mouth daily.    . Calcium Carbonate 1500 MG TABS Take 1 tablet by mouth 3 (three) times a week.    . cholecalciferol (VITAMIN D) 1000 UNITS tablet Take 1,000 Units by mouth 3 (three) times a week.    . cyclobenzaprine (FLEXERIL) 10 MG tablet as needed.    .  flecainide (TAMBOCOR) 100 MG tablet Take one-half tablet by  mouth in the morning and  one-half tablet by mouth in the evening 90 tablet 3  . fluticasone (FLONASE) 50 MCG/ACT nasal spray Place 2 sprays into both nostrils daily. 16 g 5  . folic acid (FOLVITE) 1 MG tablet Take 1 mg by mouth daily.      Marland Kitchen levothyroxine (SYNTHROID, LEVOTHROID) 112 MCG tablet Take 112 mcg by mouth daily.      . Magnesium Gluconate 550 MG TABS Take 30 mg by mouth.    . methocarbamol (ROBAXIN) 500 MG tablet Take 1 tablet (500 mg total) by mouth at bedtime as needed for muscle spasms. 30 tablet 0  . metoprolol succinate (TOPROL-XL) 25 MG 24 hr tablet TAKE 1 TABLET BY MOUTH  DAILY 90 tablet 3  . omeprazole (PRILOSEC) 40 MG capsule daily.    . rivaroxaban (XARELTO) 20 MG TABS tablet Take one tablet (20 mg) by mouth once daily with food 90 tablet 3  . Tiotropium Bromide Monohydrate (SPIRIVA RESPIMAT) 2.5 MCG/ACT AERS Inhale 2 puffs into the lungs daily. 1 Inhaler 3  . TOBRADEX ophthalmic ointment as needed.    Marland Kitchen VITAMIN B1-B12 IM Inject 1,000 mcg into the muscle every 30 (thirty) days.     Marland Kitchen VITAMIN  E PO Take 1 tablet by mouth 3 (three) times a week.     No current facility-administered medications for this visit.     No Known Allergies  Review of Systems negative except from HPI and PMH  Physical Exam BP 106/70   Pulse (!) 58   Ht 5\' 4"  (1.626 m)   Wt 145 lb (65.8 kg)   SpO2 95%   BMI 24.89 kg/m  Well developed and nourished in no acute distress HENT normal Neck supple with JVP-flat Carotids brisk and full without bruits Clear Regular rate and rhythm, no murmurs or gallops Abd-soft with active BS without hepatomegaly No Clubbing cyanosis edema Skin-warm and dry A & Oriented  Grossly normal sensory and motor function   ECG demonstrates Sinus rhythm at 57 Intervals 17/08/43   Assessment and  Plan  Atrial Fibrillation :    Raynauds  Atrial fibrillation is quiet on the flecainide.  On  Anticoagulation;  No bleeding issues   As relates to review those, this is becoming increasingly problematic. She raised the question as to discontinuing her beta blockers and trying something else. We could either try alpha/beta Blocker or CCB  We have elected to try the latter; she has no constipation so we'll try verapamil or diltiazem whichever is cheaper  Laboratories are normal  12/17 ne

## 2017-01-12 NOTE — Patient Instructions (Addendum)
Medication Instructions:  Your physician recommends that you continue on your current medications as directed. Please refer to the Current Medication list given to you today.  Handwritten Rx given for diltiazem 120 mg QD and verapamil 120 mg QD. Patient to call and let us know which medication works best.  Worthy Keeler: None ordered  Testing/Procedures: None ordered  Follow-Up: Your physician wants you to follow-up in: 6 months with Tommye Standard, PA. You will receive a reminder letter in the mail two months in advance. If you don't receive a letter, please call our office to schedule the follow-up appointment.   Any Other Special Instructions Will Be Listed Below (If Applicable).     If you need a refill on your cardiac medications before your next appointment, please call your pharmacy.

## 2017-03-30 ENCOUNTER — Other Ambulatory Visit: Payer: Self-pay | Admitting: Internal Medicine

## 2017-03-30 NOTE — Telephone Encounter (Signed)
Age 76 years Wt 65.8kg 01/12/2017  Saw Dr Caryl Comes on 01/12/2017 10/10/2016 SrCr 0.74 10/10/2016 Hgb 12.4 HCT 37.6  CrCl 68.23 Refill done for Xarelto 20 mg daily as requested

## 2017-04-10 ENCOUNTER — Other Ambulatory Visit: Payer: Self-pay | Admitting: Rheumatology

## 2017-04-10 NOTE — Telephone Encounter (Signed)
Last visit: 11/18/16 Next visit: 06/22/17  Ok to change methocarbamol to 750 mg?

## 2017-04-10 NOTE — Telephone Encounter (Signed)
ok 

## 2017-04-12 MED ORDER — METHOCARBAMOL 750 MG PO TABS: 750.0000 mg | ORAL_TABLET | Freq: Every evening | ORAL | 2 refills | Status: DC | PRN

## 2017-05-05 NOTE — Telephone Encounter (Signed)
Judeen Hammans from [facility] has scheduled pt to have a CT scan done 06/15/17. Since the pt had the CT scan done 12/06/16, does pt still need this CT to happen.  It seems the reason for the pt's CT scan that happened in June was due to an order that was placed 06/28/16 that might not have been cancelled and that was the reason for pt's CT scan that was done in June.  MR, please advise if you want pt to have the follow-up CT that is scheduled 12/20 or if we can cancel this.  Thanks!

## 2017-05-11 NOTE — Telephone Encounter (Signed)
Will see in clinic and then decide  Dr. Brand Males, M.D., [facility].C.P Pulmonary and Critical Care Medicine Staff Physician [place] [facility] Pager: [phone], If no answer or between  15:00h - 7:00h: call [phone]  05/11/2017 1:05 PM

## 2017-05-11 NOTE — Telephone Encounter (Signed)
CT canceled.

## 2017-05-11 NOTE — Telephone Encounter (Signed)
[name], we can cancel the CT scan that is scheduled in December and if we need to have her do another one after her OV, we can place another order.  Thanks!

## 2017-05-22 ENCOUNTER — Ambulatory Visit: Payer: Medicare Other | Admitting: Rheumatology

## 2017-05-29 ENCOUNTER — Other Ambulatory Visit: Payer: Self-pay | Admitting: Internal Medicine

## 2017-06-08 NOTE — Progress Notes (Signed)
Office Visit Note  Patient: [name]             Date of Birth: 10/04/1940           MRN: [id number]             PCP: Chesley Noon, MD Referring: Chesley Noon, MD Visit Date: 06/22/2017 Occupation: @GUAROCC @    Subjective:  [name] symptoms    History of Present Illness: [name] is a 76 y.o. female with a history of [name] and pulmonary emphysema with fibrosis and interstitial lung disease.  Patient states she continues to have raynaud's symptoms.  She tries to wear gloves as much as she can.  She is still taking her beta blocker for her Afib.  She states she followed up with Dr. Chase Caller on 06/12/17.  She is going to have a repeat chest CT, which will be repeated every 6 months.  She had mild worsening of her PFTs. She continues to be followed by Dr. Caryl Comes for her Afib.  She continues to follow up yearly with hematology for hemachromatosis.  Patient states her bilateral trochanteric bursitis is doing better.  She states her lumbar spine is also doing better.  She takes Robaxin as needed if she is having severe pain or muscle spasms.  She states occasionally her left 3rd digit hurts her as well as her left knee.  She denies any joint swelling.  She states her joints feel much better when she exercises on the treadmill on a regular basis.       Activities of Daily Living:  Patient reports morning stiffness for 10 minutes.   Patient Denies nocturnal pain.  Difficulty dressing/grooming: Denies Difficulty climbing stairs: Denies Difficulty getting out of chair: Denies Difficulty using hands for taps, buttons, cutlery, and/or writing: Denies   Review of Systems  Constitutional: Negative.  Negative for fatigue and weakness.  HENT: Negative.  Negative for mouth dryness.   Eyes: Negative.  Negative for dryness.  Respiratory: Positive for cough. Negative for shortness of breath.   Cardiovascular: Positive for palpitations. Negative for chest pain,  hypertension and swelling in legs/feet.  Gastrointestinal: Negative.  Negative for blood in stool, constipation and diarrhea.  Musculoskeletal: Positive for myalgias, morning stiffness and myalgias. Negative for arthralgias, joint pain, joint swelling, muscle weakness and muscle tenderness.  Skin: Positive for hair loss. Negative for rash and sensitivity to sunlight.  Neurological: Negative.  Negative for dizziness, numbness and headaches.  Psychiatric/Behavioral: Negative.  Negative for depressed mood and sleep disturbance.    PMFS History:  Patient Active Problem List   Diagnosis Date Noted  . ILD (interstitial lung disease) ([place]) 11/28/2016  . Multiple lung nodules on CT 11/28/2016  . Abnormal laboratory test 11/18/2016  . DDD L spine 11/18/2016  . 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 History of hypothyroidism 11/10/2016  . History of hemochromatosis 11/10/2016  . Pulmonary emphysema ([place]) 08/31/2016  . [name] phenomenon without gangrene 08/31/2016  . Sinusitis, chronic 08/31/2016  . Chronic cough 02/10/2016  . Irritable larynx 02/10/2016  . Stopped smoking with greater than 40 pack year history 02/10/2016  . Chest pain, unspecified 10/06/2010  . Shortness of breath 10/06/2010  . Hypothyroidism   . Hereditary hemochromatosis ([place]) 04/22/2010  . ATRIAL FIBRILLATION 10/20/2008  . [facility] 10/20/2008  . SYNCOPE AND COLLAPSE 10/20/2008    Past Medical History:  Diagnosis Date  . Atrial fibrillation ([place])    a. Flecainide therapy;  b. event monitor 4/12  .  Chest pain    a. GXT myoview 4/12: no isch., EF 86%;   b. echo 4/12: EF 55-65%, grade 1 diast dysfxn, LAE  . Depression with anxiety   . GERD (gastroesophageal reflux disease)   . 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 Beryle Beams  . History of syncope   . Hypothyroidism     Family History  Problem Relation Age of Onset  . Stroke Mother   . Heart disease Father 72  . Hemochromatosis Brother     Past Surgical History:  Procedure Laterality Date  . MOUTH SURGERY    . SHOULDER ARTHROSCOPY Left   . TOTAL ABDOMINAL HYSTERECTOMY    . VARICOSE VEIN SURGERY     Social History   Social History Narrative   REGULAR EXERCISE     Objective: Vital Signs: BP 125/62 (BP Location: Right Arm, Patient Position: Sitting, Cuff Size: Normal)   Pulse 61   Ht 5\' 3"  (1.6 m)   Wt 149 lb (67.6 kg)   BMI 26.39 kg/m    Physical Exam  Constitutional: She is oriented to person, place, and time. She appears well-developed and well-nourished.  HENT:  Head: Normocephalic and atraumatic.  Eyes: Conjunctivae and EOM are normal.  Neck: Normal range of motion.  Cardiovascular: Normal heart sounds and intact distal pulses.  Irregularly irregular   Pulmonary/Chest: Effort normal and breath sounds normal.  Abdominal: Soft. Bowel sounds are normal.  Lymphadenopathy:    She has no cervical adenopathy.  Neurological: She is alert and oriented to person, place, and time.  Skin: Skin is warm and dry. Capillary refill takes less than 2 seconds.  No skin thickness or tightness noted. No signs of digital ulcerations.   Psychiatric: She has a normal mood and affect. Her behavior is normal.  Nursing note and vitals reviewed.    Musculoskeletal Exam: Limited C-spine ROM especially with lateral ROM.  Thoracic and lumbar spine good ROM.  Shoulder joints, elbow joints, and wrist joints good ROM. MCPs, PIPs, and DIPs good ROM with no synovitis.  Mild PIP and DIP thickening consistent with osteoarthritis. Hip joints, knee joints, and ankle joints good ROM.  MTPs, PIPs, and DIPs good ROM with no synovitis.    CDAI Exam: No CDAI exam completed.    Investigation: No additional findings. CBC Latest Ref Rng & Units 10/10/2016 06/02/2016 10/06/2015  WBC 3.4 - 10.8 x10E3/uL 5.0 4.2 5.0  Hemoglobin 11.1 - 15.9 g/dL 12.4 11.5(L) 12.1  Hematocrit 34.0 - 46.6 % 37.6 34.7(L) 36.5  Platelets 150 - 379 x10E3/uL 244 211  228   CMP Latest Ref Rng & Units 10/10/2016 06/02/2016 10/06/2015  Glucose 65 - 99 mg/dL 86 108(H) 88  BUN 8 - 27 mg/dL 11 10 16   Creatinine 0.57 - 1.00 mg/dL 0.74 0.58 0.64  Sodium 134 - 144 mmol/L 141 140 143  Potassium 3.5 - 5.2 mmol/L 4.3 3.6 4.4  Chloride 96 - 106 mmol/L 101 106 102  CO2 18 - 29 mmol/L 25 26 22   Calcium 8.7 - 10.3 mg/dL 9.7 9.2 9.4  Total Protein 6.0 - 8.5 g/dL 6.7 - 6.7  Total Bilirubin 0.0 - 1.2 mg/dL 0.4 - 0.2  Alkaline Phos 39 - 117 IU/L 68 - 58  AST 0 - 40 IU/L 19 - 21  ALT 0 - 32 IU/L 12 - 14    Imaging: No results found.  Speciality Comments: No specialty comments available.    Procedures:  No procedures performed Allergies: Patient has  no known allergies.   Assessment / Plan:     Visit Diagnoses: [name] phenomenon without gangrene: She continues to have symptoms of [name] that have worsened with the cold weather.  Discussed the importance of keeping her core body temperature warm and wearing gloves.  She remains on a beta blocker to control her Afib, which causes worsening of [name] symptoms.  She will remain on Xarelto.  No ulcerations or signs of gangrene on exam.  No increased skin thickness or tightness noted.   Pulmonary emphysema with fibrosis of lung ([place]) - She's SCL 70 positive. She has no clinical features of scleroderma at this point.  She will continue to follow up with Dr. Chase Caller.     ILD (interstitial lung disease) ([place]): Her recent PFT showed mild worsening.  She will be having a CT scan of her chest in January 2019 and every 6 months following.     Abnormal laboratory test - Positive anticardiolipin IgM 47, positive beta-2 IgM 31: Her yearly labs were drawn today. - Plan: Anti-DNA antibody, double-stranded, Urinalysis, Routine w reflex microscopic, COMPLETE METABOLIC PANEL WITH GFR, CBC with Differential/Platelet, Beta-2 glycoprotein antibodies, Cardiolipin antibodies, IgG, IgM, IgA, C3 and C4  Trochanteric bursitis of right  hip: Doing better.    Bilateral hand pain - She has mild osteoarthritis.  Discussed joint protection and muscle strengthening.    Vitamin D deficiency -Vitamin D level was drawn today. Plan: VITAMIN D 25 Hydroxy (Vit-D Deficiency, Fractures)   Myalgia: Doing better   DDD (degenerative disc disease), lumbar: Improved.  No discomfort or stiffness at this time.    History of emphysema: Followed by Dr. Chase Caller   History of hemochromatosis - Followed up by hematology yearly.    History of atrial fibrillation: Followed by Dr. Caryl Comes.    Other medical conditions are listed as follows:  Other chronic sinusitis  History of hypothyroidism  Stopped smoking - with greater than 40 pack year history  Multiple lung nodules on CT     Orders: Orders Placed This Encounter  Procedures  . Anti-DNA antibody, double-stranded  . Urinalysis, Routine w reflex microscopic  . COMPLETE METABOLIC PANEL WITH GFR  . CBC with Differential/Platelet  . Beta-2 glycoprotein antibodies  . Cardiolipin antibodies, IgG, IgM, IgA  . C3 and C4  . VITAMIN D 25 Hydroxy (Vit-D Deficiency, Fractures)   No orders of the defined types were placed in this encounter.     Follow-Up Instructions: Return in about 6 months (around 12/21/2017) for [name] Phenomenon, ILD.  Bo Merino, MD Note - This record has been created using Editor, commissioning.  Chart creation errors have been sought, but may not always  have been located. Such creation errors do not reflect on  the standard of medical care.

## 2017-06-12 ENCOUNTER — Encounter: Payer: Self-pay | Admitting: Internal Medicine

## 2017-06-12 ENCOUNTER — Ambulatory Visit (INDEPENDENT_AMBULATORY_CARE_PROVIDER_SITE_OTHER): Payer: Medicare Other | Admitting: Internal Medicine

## 2017-06-12 VITALS — BP 126/78 | HR 65 | Ht 63.75 in | Wt 148.0 lb

## 2017-06-12 DIAGNOSIS — J849 Interstitial pulmonary disease, unspecified: Secondary | ICD-10-CM

## 2017-06-12 DIAGNOSIS — R768 Other specified abnormal immunological findings in serum: Secondary | ICD-10-CM | POA: Diagnosis not present

## 2017-06-12 DIAGNOSIS — I7300 Raynaud's syndrome without gangrene: Secondary | ICD-10-CM | POA: Diagnosis not present

## 2017-06-12 DIAGNOSIS — J439 Emphysema, unspecified: Secondary | ICD-10-CM | POA: Diagnosis not present

## 2017-06-12 DIAGNOSIS — R918 Other nonspecific abnormal finding of lung field: Secondary | ICD-10-CM

## 2017-06-12 DIAGNOSIS — Z87891 Personal history of nicotine dependence: Secondary | ICD-10-CM | POA: Diagnosis not present

## 2017-06-12 LAB — PULMONARY FUNCTION TEST
DL/VA % pred: 61 %
DL/VA: 2.91 ml/min/mmHg/L
DLCO UNC: 12.17 ml/min/mmHg
DLCO cor % pred: 54 %
DLCO cor: 13 ml/min/mmHg
DLCO unc % pred: 50 %
FEF 25-75 Pre: 0.84 L/sec
FEF2575-%PRED-PRE: 52 %
FEV1-%PRED-PRE: 84 %
FEV1-PRE: 1.74 L
FEV1FVC-%Pred-Pre: 86 %
FEV6-%PRED-PRE: 100 %
FEV6-PRE: 2.62 L
FEV6FVC-%PRED-PRE: 102 %
FVC-%PRED-PRE: 97 %
FVC-PRE: 2.68 L
PRE FEV1/FVC RATIO: 65 %
Pre FEV6/FVC Ratio: 98 %

## 2017-06-12 NOTE — Progress Notes (Signed)
PFT done today. 

## 2017-06-12 NOTE — Patient Instructions (Addendum)
ICD-10-CM   1. ILD (interstitial lung disease) ([place]) J84.9   2. Pulmonary emphysema, unspecified emphysema type ([place]) J43.9   3. Multiple lung nodules on CT R91.8   4. Scl-70 antibody positive R76.8   5. Stopped smoking with greater than 40 pack year history Z87.891   6. [name] phenomenon without gangrene I73.00     Pulmonary function test might show slow decline over the last 1 year  Suspect you have smokers emphysema I also think you have interstitial lung disease in the basis for this is autoimmune disease undifferentiated (on the basis that you have Raynaud and different antibody positive_    Plan coontinue daily spiriva or incruse Repeat in dec 2018/jan 2019: HRCT supine and prone  - If there is evidence of progressive interstitial lung disease then I would recommend CellCept or Imuran with comanagement with supportive Dr.  Estanislado Pandy  Followup depending on CT chest results 2018

## 2017-06-12 NOTE — Progress Notes (Signed)
Subjective:     Patient ID: [name], female   DOB: 02/28/1941, 76 y.o.   MRN: [id number]  HPI      PCP [name],[name], PA-C   HPI  IOV 02/10/2016  Chief Complaint  Patient presents with  . Pulmonary Consult    Pt referred by Dr. Melford Aase for chronic cough x 1 year. Pt states she feels she has a tickle that is causing her dry cough. Pt states she has DOE when climbing stairs. Pt deneis CP/tightness.     76 year old female with hemochromatosis homozygous gene followed by Dr. Beryle Beams (last phlebotomy many years ago and is on serial monitoring) with children and siblings with active disease. She has also atrial fibrillation followed by Dr. Caryl Comes. Reports insidious onset of chronic cough in the last 1 year. It is stable since onset. It fluctuates between mild and severe in severity. It is mostly dry in quality. Mostly present in the daytime but sometimes also wakes her up at night. It is definitely not progressive. It is episodic and present every day. Aggravated by talking sometimes and then the throat feels dry associated with tickle in the throat and also sensation the cough is coming from the upper chest retrosternally and sometimes relieved by drinking water or chewing on a lozenge. Also aggravated by seasonal changes particularly in the spring and the fall which makes her think she has allergies. There is sometimes associated gag. She also reports nonspecific occasional wheezing and associated shortness of breath that is nonspecific but present with exertion and relieved by rest No other clear cut aggravating or relieving factors.   cough associated history  - Medications: She is not on fish oil or ACE inhibitors - Sinus drainage: She does admit to spring allergies. She did have something removed from her hard palate several years ago by ENT does not know details - Acid reflux: She says she is significant acid reflux and is on Prilosec. Without which she'll have significant  symptoms - Pulmonary disease: FenO 12 02/10/2016  and normal. She denies any personal history of asthma or pulmonary fibrosis of COPD or emphysema smoked one pack per day started smoking at age 27 and quit in 1999. Making a 42 pack smoking history. Chest x-ray 08/26/2014 personally visualized is clear - Tobacco :  reports that she quit smoking about 18 years ago. She has a 42.00 pack-year smoking history. She has never used smokeless tobacco.       OV 02/18/2016  Chief Complaint  Patient presents with  . Follow-up    Pt here after PFT and HRCT. Pt denies changes in SOB and cough. Pt denies any new complaints at this time.    Follow-up chronic cough. This visit is to follow-up on test results of function test and high resolution CT chest which are described below9 results show isolated reduction in diffusion capacity which can be explained by possible early ILD and also emphysema. There are also new findings of nodule 6 mm bilaterally on lower lobes. She prefers a very cautious approach to this evaluation.     Pulmonary function test 02/12/2016 - FVC 2.8 cm/101%, FEV1 1.9 L/91%. Ratio 60/90%. Total lung capacity 115%. DLCO reduced at 14.5/60%. She is isolated reduction in diffusion capacity    HRCT chest 02/16/16 IMPRESSION: 1. Mild patchy subpleural reticulation in both lungs with a basilar predominance. No significant traction bronchiectasis. No frank honeycombing. Findings could represent an interstitial lung disease such as nonspecific interstitial pneumonia (NSIP), with early usual interstitial  pneumonia (UIP) not excluded. A follow-up high-resolution chest CT study in 12 months is recommended to assess temporal pattern stability. 2. Bilateral lower lobe solid pulmonary nodules, largest 6 mm. Non-contrast chest CT at 3-6 months is recommended. If the nodules are stable at time of repeat CT, then future CT at 18-24 months (from today's scan) is considered optional for low-risk  patients, but is recommended for high-risk patients. This recommendation follows the consensus statement: Guidelines for Management of Incidental Pulmonary Nodules Detected on CT Images:From the Fleischner Society 2017; published online before print (10.1148/radiol.7371062694). 3. Additional findings include mild-to-moderate centrilobular emphysema, aortic atherosclerosis and 2 vessel coronary atherosclerosis.   Electronically Signed   By: Ilona Sorrel M.D.   On: 02/16/2016 14:14   OV 08/31/2016  Chief Complaint  Patient presents with  . Follow-up    HRCT was never scheduled. Pt states that the cough is still present >> slightly improved since last OV. Pt states that she feels her allergies has gotten it ramped up again.    Follow-up multifactorial cough associated with autoimmune antibody positive, interstitial lung disease and emphysema previous history of smoking  After last visit in August 2017 she had high resolution CT chest in December 2017 that showed persistence of ILD. I personally visualized the CT chest. He comes for follow-up. She tells me that since August 2017 she has insidious onset of shortness of breath and is progressively worse. It is mild and present only on inclines is not therefore tenderness. In terms of her cough is persistent. It is associated with significant sinus drainage that she thinks is allergy related. She constantly clears her throat. Lab review shows autoimmune antibody positive in August 2017 with scleroderma antibody slightly elevated at 6.4. I referred her to rheumatology but she does not remember this and she did not make this follow-up. She also tells me that she definitely has a long history of [name] phenomena in her fingers but no one has ever formally diagnosed with connective tissu  e disease.  OV 11/28/2016  Chief Complaint  Patient presents with  . Follow-up    Pt here after PFT. Pt states her breathing is uchanged since last OV. Pt  c/o dry cough.- pt states this has slightly improved since last OV. Pt denies CP/tightness and f/c/s.     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 Since then she has seen rheumatologis Dr. Keturah Barre. I reviewd the notes and also discussed with the patient wa a good understading of what s going on. She tells me that some of the lupus antibodies have been positive correlating with Raynaud. Patient is on beta blocker for atrial fbrillation and it is the preference by the rheumatologist Dr. D that she switc to calcium channel blocker. She has a follow-up appointment pending with her electrophysiologist Dr. Caryl Comes.  She is scleroderma antibody positive but according to her rheumtolgist she does not have dermatologic  Manifestation of the disese. 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 She pulm  function test today and this is stable. In terms of arrest or symptoms she stable. The cough is actually improved. But she does get dyspneic walking up stairs especially a few flights. She does not wnt her to pulmonary habilitation because se exrcises on the treadmill although she does not monitor her saturations.   reports that she quit smoking  about 19 years ago. She has a 42.00 pack-year smoking history. she has never used smokeless tobacco.   OV 06/12/2017  Chief Complaint  Patient presents with  . Follow-up    Pt states that she has been doing good since last visit. States that she has a "tickle cough" that is sporadic and has SOB that she states is when she climbs 2 flights of stairs. Denies any CP.    Follow-up combined emphysema was interstitial lung disease [autoimmune undifferentiated connective tissue disease interstitial lung disease]. Autoimmune features from May 2018 rheumatology noted with Dr. Keturah Barre: Raynaud phenomenon positive  without gangrene, positive anti-cardiolipin and positive IgM and SCL-70 positive without clinical features of scleroderma    Last seen June 2018. Since then she's stable. Overall she tells me that only problem is a tickle in her throat and slight cough. This is up and down depending on the pollen exposure she gets. She gets dyspneic for climbing few to several flights of stairs. This is unchanged. She did have full function test today and that shows mild significant worsening though overall gradient is only mild.. However she's not feeling this. She has appointment with Dr. Keturah Barre  pending  Dr Lorenza Cambridge Reflux Symptom Index (> 13-15 suggestive of LPR cough)  02/10/2016   Hoarseness of problem with voice 0  Clearing  Of Throat 2  Excess throat mucus or feeling of post nasal drip 2  Difficulty swallowing food, liquid or tablets 0  Cough after eating or lying down 1  Breathing difficulties or choking episodes 1  Troublesome or annoying cough 2  Sensation of something sticking in throat or lump in throat 0  Heartburn, chest pain, indigestion, or stomach acid coming up 3  TOTAL 11     Results for ELIZ, NIGG (MRN [id number]) as of 11/28/2016 12:08  Ref. Range 02/12/2016 12:57 11/28/2016 10:53 06/12/2017   FVC-Pre Latest Units: L 2.85 2.81 2.68  FVC-%Pred-Pre Latest Units: % 101 101 97    Results for FINNLEE, GUARNIERI (MRN [id number]) as of 11/28/2016 12:08  Ref. Range 02/12/2016 12:57 11/28/2016 10:53 06/12/2017   DLCO cor Latest Units: ml/min/mmHg 14.54 13.36 12.17  DLCO cor % pred Latest Units: % 60 55 50      has a past medical history of Atrial fibrillation ([place]), Chest pain, Depression with anxiety, GERD (gastroesophageal reflux disease), Hemochromatosis, History of syncope, and Hypothyroidism.   reports that she quit smoking about 19 years ago. She has a 42.00 pack-year smoking history. she has never used smokeless tobacco.  Past Surgical History:  Procedure Laterality Date  . MOUTH  SURGERY    . SHOULDER ARTHROSCOPY Left   . TOTAL ABDOMINAL HYSTERECTOMY    . VARICOSE VEIN SURGERY      No Known Allergies  Immunization History  Administered Date(s) Administered  . Pneumococcal Conjugate-13 06/12/2014  . Pneumococcal Polysaccharide-23 06/27/2010  . Tdap 03/16/2011  . Zoster 06/27/2010    Family History  Problem Relation Age of Onset  . Stroke Mother   . Heart disease Father 16  . Hemochromatosis Brother      Current Outpatient Medications:  .  Biotin 1000 MCG tablet, Take 1,000 mcg by mouth daily., Disp: , Rfl:  .  Calcium Carbonate 1500 MG TABS, Take 1 tablet by mouth 3 (three) times a week., Disp: , Rfl:  .  cholecalciferol (VITAMIN D) 1000 UNITS tablet, Take 1,000 Units by mouth 3 (three) times a week., Disp: , Rfl:  .  flecainide (TAMBOCOR) 100 MG  tablet, Take one-half tablet by  mouth in the morning and  one-half tablet by mouth in the evening, Disp: 90 tablet, Rfl: 3 .  fluticasone (FLONASE) 50 MCG/ACT nasal spray, Place 2 sprays into both nostrils daily., Disp: 16 g, Rfl: 5 .  folic acid (FOLVITE) 1 MG tablet, Take 1 mg by mouth daily.  , Disp: , Rfl:  .  levothyroxine (SYNTHROID, LEVOTHROID) 112 MCG tablet, Take 112 mcg by mouth daily.  , Disp: , Rfl:  .  methocarbamol (ROBAXIN) 750 MG tablet, Take 1 tablet (750 mg total) by mouth at bedtime as needed for muscle spasms., Disp: 30 tablet, Rfl: 2 .  metoprolol succinate (TOPROL-XL) 25 MG 24 hr tablet, TAKE 1 TABLET BY MOUTH  DAILY, Disp: 90 tablet, Rfl: 2 .  omeprazole (PRILOSEC) 40 MG capsule, daily., Disp: , Rfl:  .  Tiotropium Bromide Monohydrate (SPIRIVA RESPIMAT) 2.5 MCG/ACT AERS, Inhale 2 puffs into the lungs daily., Disp: 1 Inhaler, Rfl: 3 .  VITAMIN B1-B12 IM, Inject 1,000 mcg into the muscle every 30 (thirty) days. , Disp: , Rfl:  .  VITAMIN E PO, Take 1 tablet by mouth 3 (three) times a week., Disp: , Rfl:  .  XARELTO 20 MG TABS tablet, TAKE 1 TABLET BY MOUTH ONCE DAILY WITH FOOD, Disp: 90  tablet, Rfl: 1  Review of Systems     Objective:   Physical Exam  Constitutional: She is oriented to person, place, and time. She appears well-developed and well-nourished. No distress.  HENT:  Head: Normocephalic and atraumatic.  Right Ear: External ear normal.  Left Ear: External ear normal.  Mouth/Throat: Oropharynx is clear and moist. No oropharyngeal exudate.  Eyes: Conjunctivae and EOM are normal. Pupils are equal, round, and reactive to light. Right eye exhibits no discharge. Left eye exhibits no discharge. No scleral icterus.  Neck: Normal range of motion. Neck supple. No JVD present. No tracheal deviation present. No thyromegaly present.  Cardiovascular: Normal rate, regular rhythm, normal heart sounds and intact distal pulses. Exam reveals no gallop and no friction rub.  No murmur heard. Pulmonary/Chest: Effort normal and breath sounds normal. No respiratory distress. She has no wheezes. She has no rales. She exhibits no tenderness.  ? Crackles at base  Abdominal: Soft. Bowel sounds are normal. She exhibits no distension and no mass. There is no tenderness. There is no rebound and no guarding.  Musculoskeletal: Normal range of motion. She exhibits no edema or tenderness.  Lymphadenopathy:    She has no cervical adenopathy.  Neurological: She is alert and oriented to person, place, and time. She has normal reflexes. No cranial nerve deficit. She exhibits normal muscle tone. Coordination normal.  Skin: Skin is warm and dry. No rash noted. She is not diaphoretic. No erythema. No pallor.  Mild raynaud  Psychiatric: She has a normal mood and affect. Her behavior is normal. Judgment and thought content normal.  Vitals reviewed.  Vitals:   06/12/17 1158  BP: 126/78  Pulse: 65  SpO2: 99%  Weight: 148 lb (67.1 kg)  Height: 5' 3.75" (1.619 m)    BMI    Body Mass Index:  25.60 kg/m        Assessment:       ICD-10-CM   1. ILD (interstitial lung disease) ([place]) J84.9   2.  Pulmonary emphysema, unspecified emphysema type ([place]) J43.9   3. Multiple lung nodules on CT R91.8   4. Scl-70 antibody positive R76.8   5. Stopped smoking with greater than  40 pack year history Z87.891   6. [name] phenomenon without gangrene I73.00        Plan:      Pulmonary function test might show slow decline over the last 1 year  Suspect you have smokers emphysema I also think you have interstitial lung disease in the basis for this is autoimmune disease undifferentiated (on the basis that you have Raynaud and different antibody positive_    Plan coontinue daily spiriva or incruse Repeat in dec 2018/jan 2019: HRCT supine and prone  - If there is evidence of progressive interstitial lung disease then I would recommend CellCept or Imuran with comanagement with supportive Dr.  Estanislado Pandy  Followup depending on CT chest results 2018   (> 50% of this 15 min visit spent in face to face counseling or/and coordination of care)   Dr. Brand Males, M.D., [facility].C.P Pulmonary and Critical Care Medicine Staff Physician, [place] Director - Interstitial Lung Disease  Program  Pulmonary [place] at [place], [place], [address]  Pager: [phone], If no answer or between  15:00h - 7:00h: call [phone] Telephone: [phone]

## 2017-06-15 ENCOUNTER — Inpatient Hospital Stay: Admission: RE | Admit: 2017-06-15 | Payer: Medicare Other | Source: Ambulatory Visit

## 2017-06-22 ENCOUNTER — Encounter: Payer: Self-pay | Admitting: Rheumatology

## 2017-06-22 ENCOUNTER — Ambulatory Visit (INDEPENDENT_AMBULATORY_CARE_PROVIDER_SITE_OTHER): Payer: Medicare Other | Admitting: Rheumatology

## 2017-06-22 VITALS — BP 125/62 | HR 61 | Ht 63.0 in | Wt 149.0 lb

## 2017-06-22 DIAGNOSIS — M79642 Pain in left hand: Secondary | ICD-10-CM

## 2017-06-22 DIAGNOSIS — E559 Vitamin D deficiency, unspecified: Secondary | ICD-10-CM

## 2017-06-22 DIAGNOSIS — R899 Unspecified abnormal finding in specimens from other organs, systems and tissues: Secondary | ICD-10-CM | POA: Diagnosis not present

## 2017-06-22 DIAGNOSIS — J849 Interstitial pulmonary disease, unspecified: Secondary | ICD-10-CM

## 2017-06-22 DIAGNOSIS — Z87891 Personal history of nicotine dependence: Secondary | ICD-10-CM

## 2017-06-22 DIAGNOSIS — M791 Myalgia, unspecified site: Secondary | ICD-10-CM

## 2017-06-22 DIAGNOSIS — M7061 Trochanteric bursitis, right hip: Secondary | ICD-10-CM

## 2017-06-22 DIAGNOSIS — Z8679 Personal history of other diseases of the circulatory system: Secondary | ICD-10-CM | POA: Diagnosis not present

## 2017-06-22 DIAGNOSIS — Z8639 Personal history of other endocrine, nutritional and metabolic disease: Secondary | ICD-10-CM

## 2017-06-22 DIAGNOSIS — J439 Emphysema, unspecified: Secondary | ICD-10-CM | POA: Diagnosis not present

## 2017-06-22 DIAGNOSIS — Z8709 Personal history of other diseases of the respiratory system: Secondary | ICD-10-CM | POA: Diagnosis not present

## 2017-06-22 DIAGNOSIS — M79641 Pain in right hand: Secondary | ICD-10-CM

## 2017-06-22 DIAGNOSIS — R918 Other nonspecific abnormal finding of lung field: Secondary | ICD-10-CM

## 2017-06-22 DIAGNOSIS — M5136 Other intervertebral disc degeneration, lumbar region: Secondary | ICD-10-CM | POA: Diagnosis not present

## 2017-06-22 DIAGNOSIS — J328 Other chronic sinusitis: Secondary | ICD-10-CM | POA: Diagnosis not present

## 2017-06-22 DIAGNOSIS — I7300 Raynaud's syndrome without gangrene: Secondary | ICD-10-CM | POA: Diagnosis not present

## 2017-06-22 DIAGNOSIS — J8410 Pulmonary fibrosis, unspecified: Secondary | ICD-10-CM

## 2017-06-23 LAB — COMPLETE METABOLIC PANEL WITH GFR
AG Ratio: 1.7 (calc) (ref 1.0–2.5)
ALKALINE PHOSPHATASE (APISO): 64 U/L (ref 33–130)
ALT: 9 U/L (ref 6–29)
AST: 16 U/L (ref 10–35)
Albumin: 4.2 g/dL (ref 3.6–5.1)
BILIRUBIN TOTAL: 0.4 mg/dL (ref 0.2–1.2)
BUN: 10 mg/dL (ref 7–25)
CHLORIDE: 103 mmol/L (ref 98–110)
CO2: 30 mmol/L (ref 20–32)
Calcium: 9.5 mg/dL (ref 8.6–10.4)
Creat: 0.68 mg/dL (ref 0.60–0.93)
GFR, EST AFRICAN AMERICAN: 99 mL/min/{1.73_m2} (ref >=60)
GFR, Est Non African American: 86 mL/min/{1.73_m2} (ref >=60)
GLUCOSE: 87 mg/dL (ref 65–99)
Globulin: 2.5 g/dL (calc) (ref 1.9–3.7)
Potassium: 4.5 mmol/L (ref 3.5–5.3)
Sodium: 139 mmol/L (ref 135–146)
TOTAL PROTEIN: 6.7 g/dL (ref 6.1–8.1)

## 2017-06-23 LAB — URINALYSIS, ROUTINE W REFLEX MICROSCOPIC
BILIRUBIN URINE: NEGATIVE
Bacteria, UA: NONE SEEN /HPF
Glucose, UA: NEGATIVE
HGB URINE DIPSTICK: NEGATIVE
Hyaline Cast: NONE SEEN /LPF
Ketones, ur: NEGATIVE
Nitrite: NEGATIVE
PROTEIN: NEGATIVE
RBC / HPF: NONE SEEN /HPF (ref 0–2)
SQUAMOUS EPITHELIAL / LPF: NONE SEEN /HPF (ref <=5)
Specific Gravity, Urine: 1.005 (ref 1.001–1.03)
WBC, UA: NONE SEEN /HPF (ref 0–5)
pH: 8 (ref 5.0–8.0)

## 2017-06-23 LAB — CBC WITH DIFFERENTIAL/PLATELET
BASOS PCT: 1.3 %
Basophils Absolute: 69 cells/uL (ref 0–200)
EOS PCT: 3.6 %
Eosinophils Absolute: 191 cells/uL (ref 15–500)
HEMATOCRIT: 35.9 % (ref 35.0–45.0)
HEMOGLOBIN: 12.1 g/dL (ref 11.7–15.5)
LYMPHS ABS: 1373 {cells}/uL (ref 850–3900)
MCH: 32.5 pg (ref 27.0–33.0)
MCHC: 33.7 g/dL (ref 32.0–36.0)
MCV: 96.5 fL (ref 80.0–100.0)
MPV: 12.6 fL — AB (ref 7.5–12.5)
Monocytes Relative: 8.7 %
NEUTROS ABS: 3207 {cells}/uL (ref 1500–7800)
Neutrophils Relative %: 60.5 %
Platelets: 249 10*3/uL (ref 140–400)
RBC: 3.72 10*6/uL — AB (ref 3.80–5.10)
RDW: 12.8 % (ref 11.0–15.0)
Total Lymphocyte: 25.9 %
WBC mixed population: 461 cells/uL (ref 200–950)
WBC: 5.3 10*3/uL (ref 3.8–10.8)

## 2017-06-23 LAB — ANTI-DNA ANTIBODY, DOUBLE-STRANDED: ds DNA Ab: 2 IU/mL

## 2017-06-23 LAB — C3 AND C4
C3 Complement: 149 mg/dL (ref 83–193)
C4 Complement: 30 mg/dL (ref 15–57)

## 2017-06-23 LAB — VITAMIN D 25 HYDROXY (VIT D DEFICIENCY, FRACTURES): Vit D, 25-Hydroxy: 31 ng/mL (ref 30–100)

## 2017-06-23 LAB — CARDIOLIPIN ANTIBODIES, IGG, IGM, IGA
ANTICARDIOLIPIN IGM: 35 [MPL'U] — AB
Anticardiolipin IgA: 29 [APL'U] — ABNORMAL HIGH

## 2017-06-23 LAB — BETA-2 GLYCOPROTEIN ANTIBODIES: BETA 2 GLYCO 1 IGM: 19 SMU (ref <=20)

## 2017-06-23 NOTE — Progress Notes (Signed)
Anticardiolipin remains elevated.  She should continue taking Xarelto.  All other labs are stable.

## 2017-07-02 ENCOUNTER — Other Ambulatory Visit: Payer: Self-pay | Admitting: Rheumatology

## 2017-07-02 ENCOUNTER — Other Ambulatory Visit: Payer: Self-pay | Admitting: Internal Medicine

## 2017-07-03 ENCOUNTER — Ambulatory Visit (INDEPENDENT_AMBULATORY_CARE_PROVIDER_SITE_OTHER)
Admission: RE | Admit: 2017-07-03 | Discharge: 2017-07-03 | Disposition: A | Discharge status: Home / Self Care | Payer: Medicare Other | Source: Ambulatory Visit | Attending: Internal Medicine | Admitting: Internal Medicine

## 2017-07-03 ENCOUNTER — Telehealth: Payer: Self-pay | Admitting: Internal Medicine

## 2017-07-03 DIAGNOSIS — J849 Interstitial pulmonary disease, unspecified: Secondary | ICD-10-CM

## 2017-07-03 NOTE — Telephone Encounter (Signed)
Tara SarnaAnastasia Fiedler appears t have definite ILD and is more than aug 2017. Please give her followup into an ILD clinic with me 30 min slot. 07/18/17 or in feb. TUe PM ILD clinic  Dr. Brand Males, M.D., [facility].C.P Pulmonary and Critical Care Medicine Staff Physician, [place] Director - Interstitial Lung Disease  Program  Pulmonary [place] at [place], [place], [address]  Pager: [phone], If no answer or between  15:00h - 7:00h: call [phone] Telephone: [phone]      Ct Chest High Resolution  Result Date: 07/03/2017 CLINICAL DATA:  Pulmonary fibrosis, interstitial lung disease. EXAM: CT CHEST WITHOUT CONTRAST TECHNIQUE: Multidetector CT imaging of the chest was performed following the standard protocol without intravenous contrast. High resolution imaging of the lungs, as well as inspiratory and expiratory imaging, was performed. COMPARISON:  12/06/2016, 06/15/2016 and 02/16/2016. FINDINGS: Cardiovascular: Atherosclerotic calcification of the arterial vasculature, including moderate involvement of the coronary arteries. Heart size normal. No pericardial effusion. Mediastinum/Nodes: Mediastinal lymph nodes are not enlarged by CT size criteria. Hilar regions are difficult to definitively evaluate without IV contrast. No axillary adenopathy. Esophagus is grossly unremarkable. Lungs/Pleura: Moderate centrilobular emphysema. Mild basilar predominant subpleural reticulation and ground-glass, increased from 02/16/2016. No traction bronchiectasis/ bronchiolectasis, architectural distortion or honeycombing. Side-by-side nodules in the medial left lower lobe measure up to 6 mm (series 3, image 95), unchanged from 02/16/2016 and considered benign. No air trapping. No pleural fluid. Airway is unremarkable. Upper Abdomen: Visualized portions of the liver, gallbladder, adrenal glands, kidneys, spleen, pancreas, stomach and bowel are  grossly unremarkable. No upper abdominal adenopathy. Musculoskeletal: Degenerative changes in the spine. No worrisome lytic or sclerotic lesions. IMPRESSION: 1. Mild progression in basilar predominant subpleural fibrosis from 02/16/2016, raising suspicion for usual interstitial pneumonitis. 2. Aortic atherosclerosis (ICD10-170.0). Moderate coronary artery calcification. 3.  Emphysema (ICD10-J43.9). Electronically Signed   By: Lorin Picket M.D.   On: 07/03/2017 09:54

## 2017-07-03 NOTE — Telephone Encounter (Signed)
Last Visit: 04/22/17 Next Visit due in June 2019. Message sent to the front to schedule patient   Okay to refill per Dr. Estanislado Pandy

## 2017-07-04 NOTE — Telephone Encounter (Signed)
Called pt to let her know results of ct scan and to see if I could schedule her next appt with MR but spoke with pt's daughter, Larene Beach who stated pt was not home.   Palma Holter the date of 07/18/17 that I was trying to get pt set up for an appt. Also told her that we would let pt know the CT results when pt called back.  Will leave this encounter open until we have scheduled pt for an appt in the ILD clinic with MR

## 2017-07-04 NOTE — Telephone Encounter (Signed)
Pt is aware of below message and voiced her understanding.  Apt has been made for 07/18/17 at 4:00. Nothing further is needed.

## 2017-07-07 ENCOUNTER — Encounter: Payer: Self-pay | Admitting: *Deleted

## 2017-07-09 IMAGING — CT CT CHEST HIGH RESOLUTION W/O CM
2 of 6 series · 14 of 36 positions shown, 17 images · non-contrast
Comparison: Chest CT 06/15/2016.

CLINICAL DATA: 76-year-old female with history of interstitial lung
disease. Followup study.

EXAM:
CT CHEST WITHOUT CONTRAST
TECHNIQUE: Multidetector CT imaging of the chest was performed following the
standard protocol without intravenous contrast. High resolution
imaging of the lungs, as well as inspiratory and expiratory imaging,
was performed.

[Series 2: high resolution · axial · 0.62mm/px · z∈[-290,-26]mm · 11 of 148 slices shown, 14 images]
[im 8/148  mediastinal]
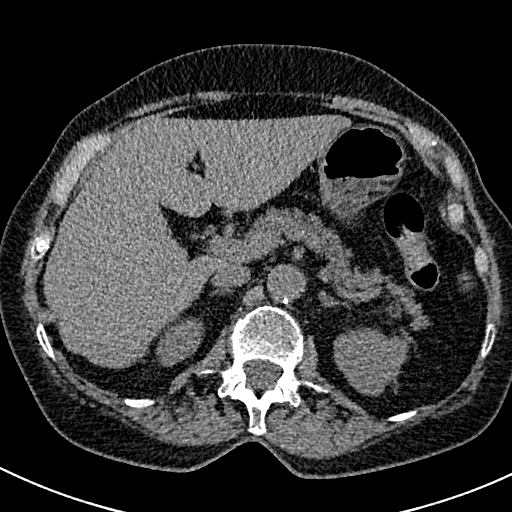
[im 8/148  lung]
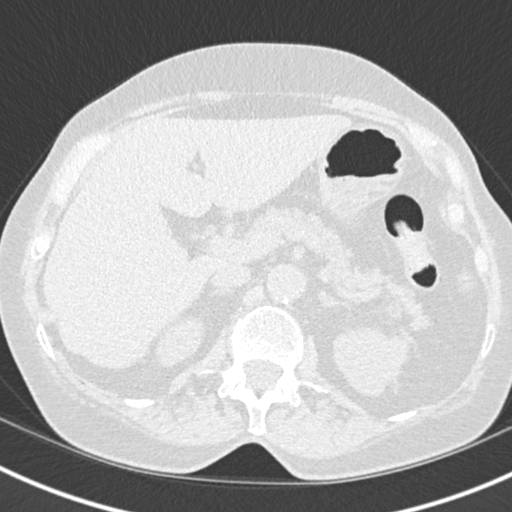
[im 23/148  lung]
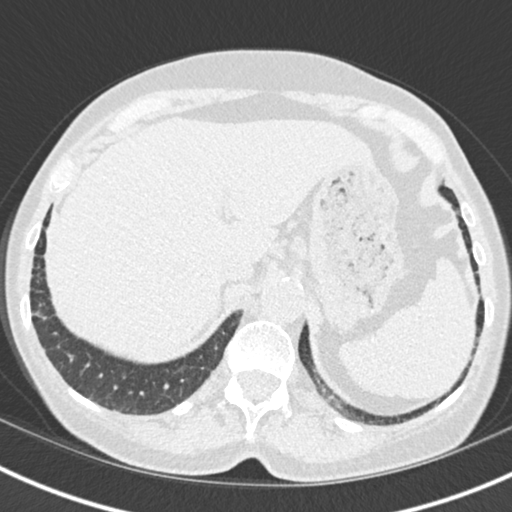
[im 37/148  lung]
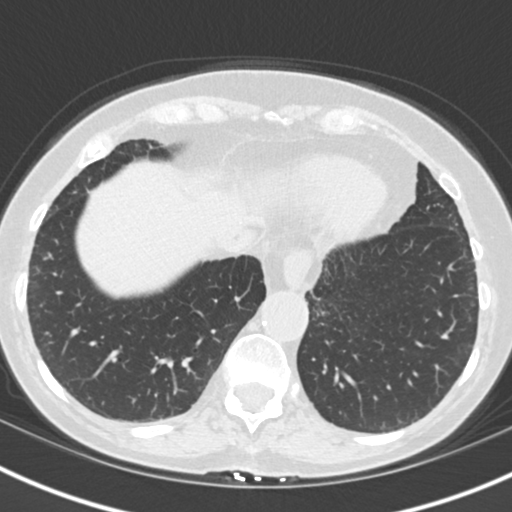
[im 52/148  lung]
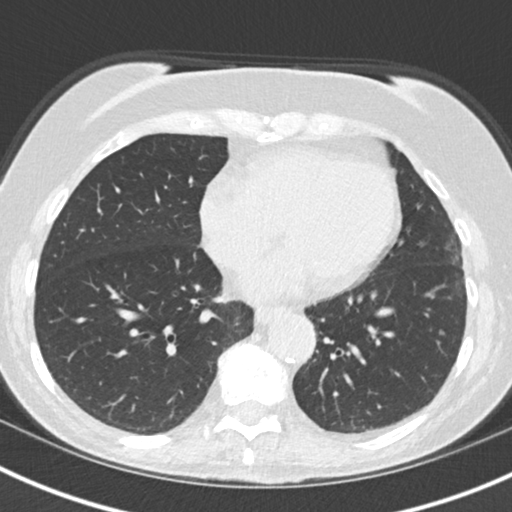
[im 59/148  mediastinal]
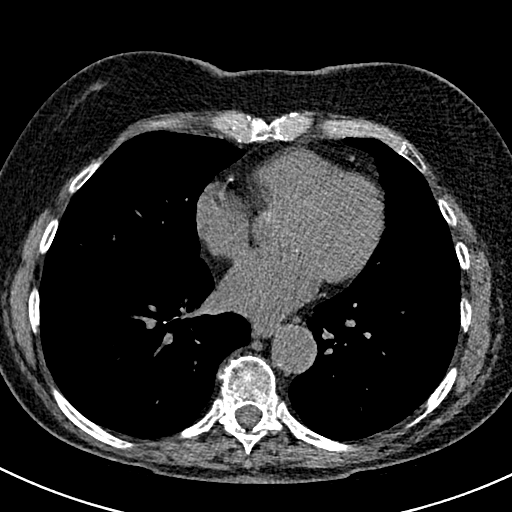
[im 59/148  lung]
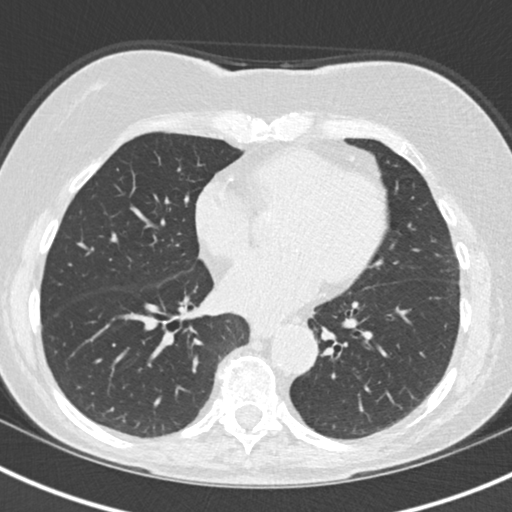
[im 74/148  lung]
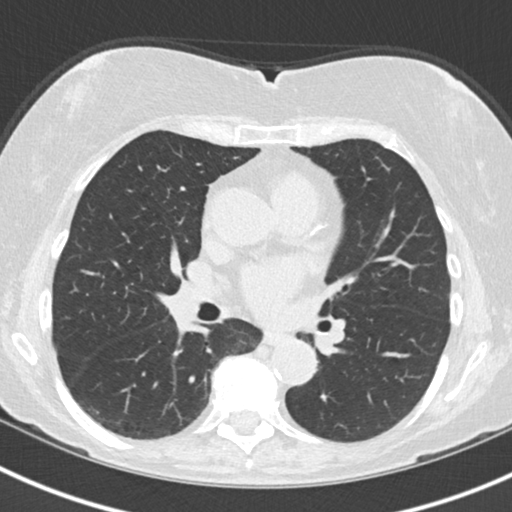
[im 89/148  lung]
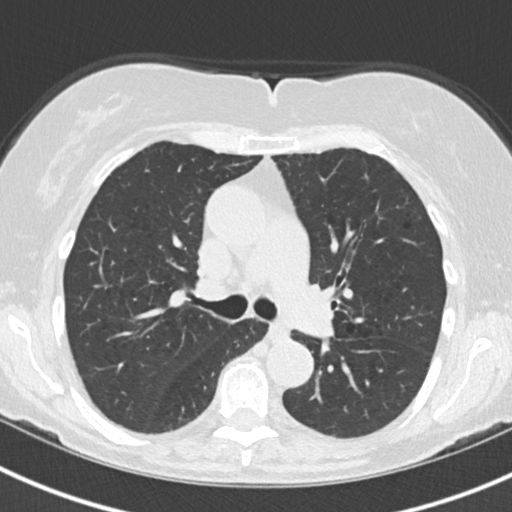
[im 96/148  lung]
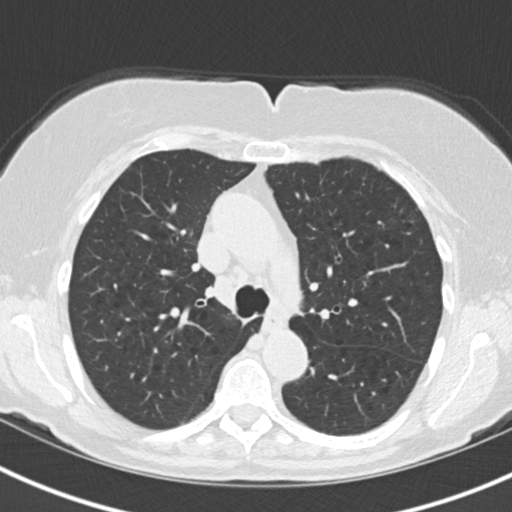
[im 111/148  mediastinal]
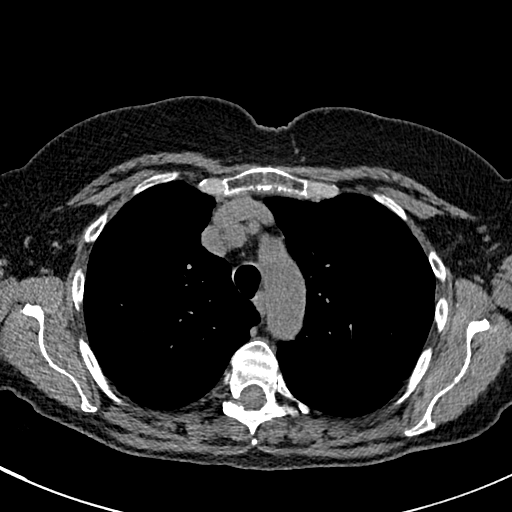
[im 111/148  lung]
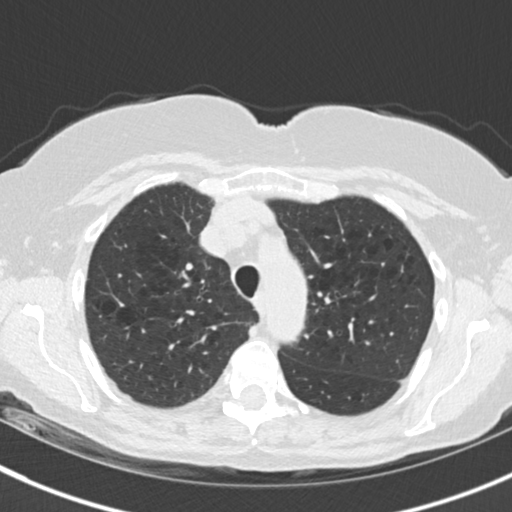
[im 125/148  lung]
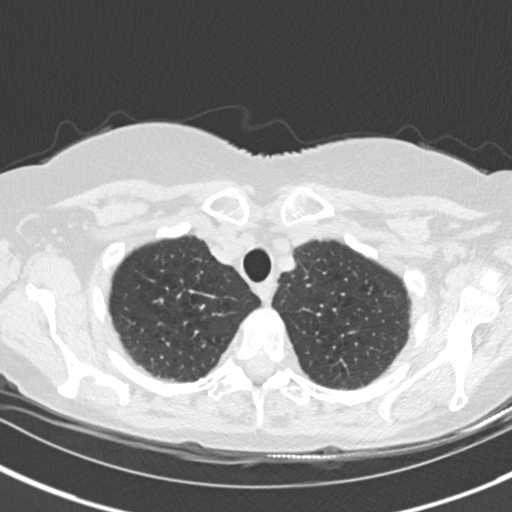
[im 140/148  lung]
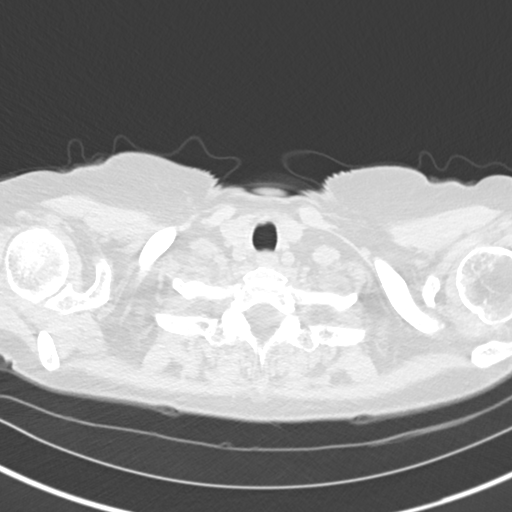

[Series 10: coronal · coronal · 0.59mm/px · 3 of 115 slices shown]
[im 23/115  lung]
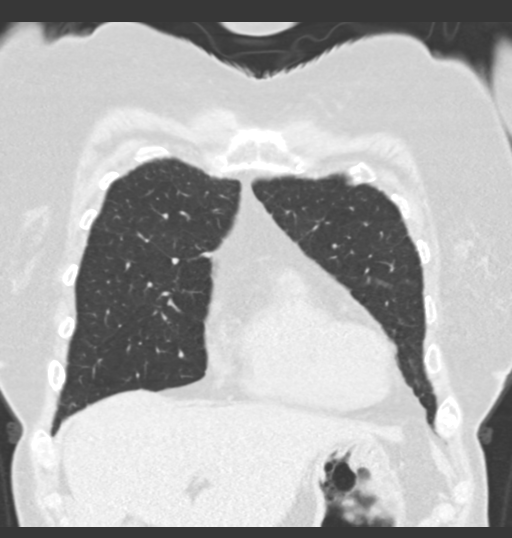
[im 46/115  lung]
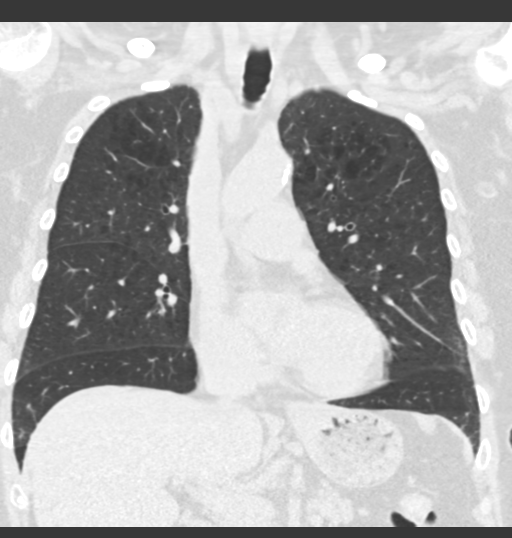
[im 69/115  lung]
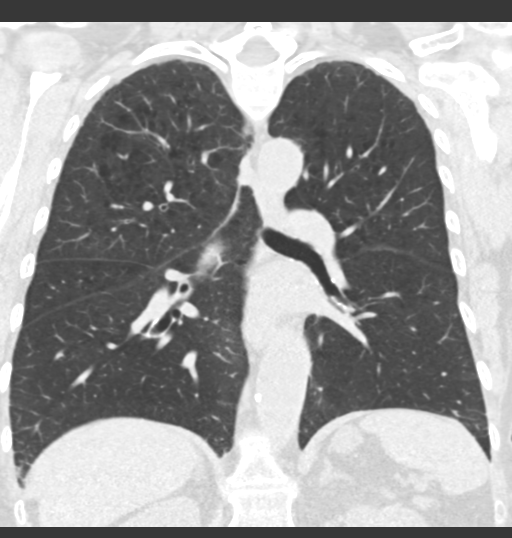

[14 of 36 positions shown; findings below may reference images not displayed]

FINDINGS: Cardiovascular: Heart size is normal. There is no significant
pericardial fluid, thickening or pericardial calcification. There is
aortic atherosclerosis, as well as atherosclerosis of the great
vessels of the mediastinum and the coronary arteries, including
calcified atherosclerotic plaque in the left main, left anterior
descending, left circumflex and right coronary arteries.

Mediastinum/Nodes: No pathologically enlarged mediastinal or hilar
lymph nodes. Esophagus is unremarkable in appearance. No axillary
lymphadenopathy.

Lungs/Pleura: 6 mm subpleural nodule in the periphery of the right
lower lobe (axial image 128 of series 3) is stable compared to prior
study 02/16/2016, considered benign. 6 mm left lower lobe nodule
(image 93 of series 3) and adjacent satellite 3 mm nodule are both
unchanged, also considered benign. No other larger more suspicious
appearing pulmonary nodules or masses are noted. No acute
consolidative airspace disease. No pleural effusions. Mild diffuse
bronchial wall thickening with mild to moderate centrilobular and
mild paraseptal emphysema. High-resolution images again demonstrate
some very subtle areas of peripheral predominant ground-glass
attenuation and septal thickening in the extreme lung bases
bilaterally which appear essentially unchanged compared to the prior
study. No associated traction bronchiectasis or frank honeycombing.
Inspiratory and expiratory imaging demonstrates mild air trapping,
indicative of mild small airways disease.

Upper Abdomen: Aortic atherosclerosis.

Musculoskeletal: There are no aggressive appearing lytic or blastic
lesions noted in the visualized portions of the skeleton.
IMPRESSION: 1. Previously noted tiny pulmonary nodules are stable compared to
prior examinations, considered benign requiring no future imaging
followup. This recommendation follows the consensus statement:
Guidelines for Management of Incidental Pulmonary Nodules Detected
[DATE].
2. Subtle changes again noted in the lung bases, stable compared to
the prior examination, again suggestive of mild nonspecific
interstitial pneumonia (NSIP).
3. Mild diffuse bronchial wall thickening with mild to moderate
centrilobular and mild paraseptal emphysema; imaging findings
suggestive of underlying COPD.
4. Aortic atherosclerosis, in addition to left main and 3 vessel
coronary artery disease. Please note that although the presence of
coronary artery calcium documents the presence of coronary artery
disease, the severity of this disease and any potential stenosis
cannot be assessed on this non-gated CT examination. Assessment for
potential risk factor modification, dietary therapy or pharmacologic
therapy may be warranted, if clinically indicated.

Aortic Atherosclerosis (WJZ1K-SOW.W) and Emphysema (WJZ1K-T30.P).

## 2017-07-13 ENCOUNTER — Telehealth: Payer: Self-pay | Admitting: Rheumatology

## 2017-07-13 NOTE — Telephone Encounter (Signed)
-----   Message from Carole Binning, LPN sent at 11/30/598  8:21 AM EST ----- Regarding: Please schedule patient for a follow up visit.  Please schedule patient for a follow up visit. Patient due June 2019. Thanks!

## 2017-07-13 NOTE — Telephone Encounter (Signed)
I LMOM for patient to call, and schedule rov. °

## 2017-07-18 ENCOUNTER — Ambulatory Visit (INDEPENDENT_AMBULATORY_CARE_PROVIDER_SITE_OTHER): Payer: Medicare Other

## 2017-07-18 ENCOUNTER — Encounter: Payer: Self-pay | Admitting: Internal Medicine

## 2017-07-18 ENCOUNTER — Other Ambulatory Visit: Payer: Medicare Other

## 2017-07-18 ENCOUNTER — Encounter (INDEPENDENT_AMBULATORY_CARE_PROVIDER_SITE_OTHER): Payer: Self-pay | Admitting: Orthopaedic Surgery

## 2017-07-18 ENCOUNTER — Ambulatory Visit (INDEPENDENT_AMBULATORY_CARE_PROVIDER_SITE_OTHER): Payer: Medicare Other | Admitting: Orthopaedic Surgery

## 2017-07-18 ENCOUNTER — Ambulatory Visit (INDEPENDENT_AMBULATORY_CARE_PROVIDER_SITE_OTHER): Payer: Medicare Other | Admitting: Internal Medicine

## 2017-07-18 ENCOUNTER — Telehealth: Payer: Self-pay | Admitting: Internal Medicine

## 2017-07-18 VITALS — BP 122/72 | HR 67 | Ht 63.0 in | Wt 145.8 lb

## 2017-07-18 DIAGNOSIS — M25562 Pain in left knee: Secondary | ICD-10-CM | POA: Diagnosis not present

## 2017-07-18 DIAGNOSIS — J439 Emphysema, unspecified: Secondary | ICD-10-CM | POA: Diagnosis not present

## 2017-07-18 DIAGNOSIS — R768 Other specified abnormal immunological findings in serum: Secondary | ICD-10-CM

## 2017-07-18 DIAGNOSIS — I7300 Raynaud's syndrome without gangrene: Secondary | ICD-10-CM | POA: Diagnosis not present

## 2017-07-18 DIAGNOSIS — J849 Interstitial pulmonary disease, unspecified: Secondary | ICD-10-CM

## 2017-07-18 NOTE — Progress Notes (Signed)
Office Visit Note   Patient: [name]           Date of Birth: 05-16-1941           MRN: [id number] Visit Date: 07/18/2017              Requested by: Chesley Noon, MD [place], [place] [address] PCP: Chesley Noon, MD   Assessment & Plan: Visit Diagnoses:  1. Acute pain of left knee     Plan: Impression is 77 year old female with age-appropriate osteoarthritis exacerbation.  Patient seems to be doing well overall.  I recommend supportive treatment.  Discussed with patient that pain is likely referred from the knee and less likely from the cyst itself.  Questions encouraged and answered.  Follow-up as needed.  Follow-Up Instructions: Return if symptoms worsen or fail to improve.   Orders:  Orders Placed This Encounter  Procedures  . XR KNEE 3 VIEW LEFT   No orders of the defined types were placed in this encounter.     Procedures: No procedures performed   Clinical Data: No additional findings.   Subjective: Chief Complaint  Patient presents with  . Left Knee - Pain    Patient is a 77 year old female who comes in with left knee pain radiating posteriorly.  She was found to have a Baker's cyst on a Doppler which was also negative for DVT.  She occasionally has paresthesias but overall she is feeling better.  Swelling.    Review of Systems  Constitutional: Negative.   HENT: Negative.   Eyes: Negative.   Respiratory: Negative.   Cardiovascular: Negative.   Endocrine: Negative.   Musculoskeletal: Negative.   Neurological: Negative.   Hematological: Negative.   Psychiatric/Behavioral: Negative.   All other systems reviewed and are negative.    Objective: Vital Signs: There were no vitals taken for this visit.  Physical Exam  Constitutional: She is oriented to person, place, and time. She appears well-developed and well-nourished.  HENT:  Head: Normocephalic and atraumatic.  Eyes: EOM are normal.  Neck: Neck supple.    Pulmonary/Chest: Effort normal.  Abdominal: Soft.  Neurological: She is alert and oriented to person, place, and time.  Skin: Skin is warm. Capillary refill takes less than 2 seconds.  Psychiatric: She has a normal mood and affect. Her behavior is normal. Judgment and thought content normal.  Nursing note and vitals reviewed.   Ortho Exam Left knee exam shows no joint effusion.  Collaterals and cruciates are stable.  Normal range of motion.  Baker's cyst is not overly impressive or easy to palpate. Specialty Comments:  No specialty comments available.  Imaging: Xr Knee 3 View Left  Result Date: 07/18/2017 Age-appropriate osteoarthritis    PMFS History: Patient Active Problem List   Diagnosis Date Noted  . ILD (interstitial lung disease) ([place]) 11/28/2016  . Multiple lung nodules on CT 11/28/2016  . Abnormal laboratory test 11/18/2016  . DDD L spine 11/18/2016  . 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 History of hypothyroidism 11/10/2016  . History of hemochromatosis 11/10/2016  . Pulmonary emphysema ([place]) 08/31/2016  . [name] phenomenon without gangrene 08/31/2016  . Sinusitis, chronic 08/31/2016  . Chronic cough 02/10/2016  . Irritable larynx 02/10/2016  . Stopped smoking with greater than 40 pack year history 02/10/2016  . Chest pain, unspecified 10/06/2010  . Shortness of breath 10/06/2010  . Hypothyroidism   . Hereditary hemochromatosis ([place]) 04/22/2010  . ATRIAL FIBRILLATION 10/20/2008  . [facility] 10/20/2008  .  SYNCOPE AND COLLAPSE 10/20/2008   Past Medical History:  Diagnosis Date  . Atrial fibrillation ([place])    a. Flecainide therapy;  b. event monitor 4/12  . Chest pain    a. GXT myoview 4/12: no isch., EF 86%;   b. echo 4/12: EF 55-65%, grade 1 diast dysfxn, LAE  . Depression with anxiety   . GERD (gastroesophageal reflux disease)   . 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 Beryle Beams  . History of syncope   . Hypothyroidism      Family History  Problem Relation Age of Onset  . Stroke Mother   . Heart disease Father 109  . Hemochromatosis Brother     Past Surgical History:  Procedure Laterality Date  . MOUTH SURGERY    . SHOULDER ARTHROSCOPY Left   . TOTAL ABDOMINAL HYSTERECTOMY    . VARICOSE VEIN SURGERY     Social History   Occupational History  . Occupation: RETIRED    Employer: RETIRED  Tobacco Use  . Smoking status: Former Smoker    Packs/day: 1.00    Years: 42.00    Pack years: 42.00    Last attempt to quit: 06/27/1997    Years since quitting: 20.0  . Smokeless tobacco: Never Used  Substance and Sexual Activity  . Alcohol use: Yes    Alcohol/week: 0.6 oz    Types: 1 Standard drinks or equivalent per week    Comment: 2 DRINKS DAILY  . Drug use: No  . Sexual activity: Not on file

## 2017-07-18 NOTE — Telephone Encounter (Signed)
Hi Shaili  [name] has progressive ILD with scl-70 positive and Raynaodu. Can you see her sooner please as pre-cellcept visit. I do not mind starting and monitoring but like your visit and input before I start  Thanks  Dr. Brand Males, M.D., [facility].C.P Pulmonary and Critical Care Medicine Staff Physician, [place] Director - Interstitial Lung Disease  Program  Pulmonary [place] at [place], [place], [address]  Pager: [phone], If no answer or between  15:00h - 7:00h: call [phone] Telephone: [phone]

## 2017-07-18 NOTE — Patient Instructions (Addendum)
ICD-10-CM   1. ILD (interstitial lung disease) ([place]) J84.9   2. Pulmonary emphysema, unspecified emphysema type ([place]) J43.9   3. Scl-70 antibody positive R76.8   4. [name] phenomenon without gangrene I73.00    There is evidence of progresssion in ILD and the basis for this is autoimmune  Plan Check quanterferon gold test, hepatitis virus panel , and G6PD blood test Would like to start cellcept with bactrim based on above results Have sent message to Dr Estanislado Pandy to see you soon; will try to reach out to her coontinue daily spiriva or incruse  Followup 6 - 8 weeks in ILD clinic or sooner if needed

## 2017-07-18 NOTE — Progress Notes (Signed)
Subjective:     Patient ID: [name], female   DOB: 10/26/1940, 77 y.o.   MRN: [id number]  HPI    PCP [name],[name], PA-C   HPI  IOV 02/10/2016  Chief Complaint  Patient presents with  . Pulmonary Consult    Pt referred by Dr. Melford Aase for chronic cough x 1 year. Pt states she feels she has a tickle that is causing her dry cough. Pt states she has DOE when climbing stairs. Pt deneis CP/tightness.     77 year old female with hemochromatosis homozygous gene followed by Dr. Beryle Beams (last phlebotomy many years ago and is on serial monitoring) with children and siblings with active disease. She has also atrial fibrillation followed by Dr. Caryl Comes. Reports insidious onset of chronic cough in the last 1 year. It is stable since onset. It fluctuates between mild and severe in severity. It is mostly dry in quality. Mostly present in the daytime but sometimes also wakes her up at night. It is definitely not progressive. It is episodic and present every day. Aggravated by talking sometimes and then the throat feels dry associated with tickle in the throat and also sensation the cough is coming from the upper chest retrosternally and sometimes relieved by drinking water or chewing on a lozenge. Also aggravated by seasonal changes particularly in the spring and the fall which makes her think she has allergies. There is sometimes associated gag. She also reports nonspecific occasional wheezing and associated shortness of breath that is nonspecific but present with exertion and relieved by rest No other clear cut aggravating or relieving factors.   cough associated history  - Medications: She is not on fish oil or ACE inhibitors - Sinus drainage: She does admit to spring allergies. She did have something removed from her hard palate several years ago by ENT does not know details - Acid reflux: She says she is significant acid reflux and is on Prilosec. Without which she'll have significant  symptoms - Pulmonary disease: FenO 12 02/10/2016  and normal. She denies any personal history of asthma or pulmonary fibrosis of COPD or emphysema smoked one pack per day started smoking at age 42 and quit in 1999. Making a 42 pack smoking history. Chest x-ray 08/26/2014 personally visualized is clear - Tobacco :  reports that she quit smoking about 18 years ago. She has a 42.00 pack-year smoking history. She has never used smokeless tobacco.       OV 02/18/2016  Chief Complaint  Patient presents with  . Follow-up    Pt here after PFT and HRCT. Pt denies changes in SOB and cough. Pt denies any new complaints at this time.    Follow-up chronic cough. This visit is to follow-up on test results of function test and high resolution CT chest which are described below9 results show isolated reduction in diffusion capacity which can be explained by possible early ILD and also emphysema. There are also new findings of nodule 6 mm bilaterally on lower lobes. She prefers a very cautious approach to this evaluation.     Pulmonary function test 02/12/2016 - FVC 2.8 cm/101%, FEV1 1.9 L/91%. Ratio 60/90%. Total lung capacity 115%. DLCO reduced at 14.5/60%. She is isolated reduction in diffusion capacity    HRCT chest 02/16/16 IMPRESSION: 1. Mild patchy subpleural reticulation in both lungs with a basilar predominance. No significant traction bronchiectasis. No frank honeycombing. Findings could represent an interstitial lung disease such as nonspecific interstitial pneumonia (NSIP), with early usual interstitial pneumonia (UIP)  not excluded. A follow-up high-resolution chest CT study in 12 months is recommended to assess temporal pattern stability. 2. Bilateral lower lobe solid pulmonary nodules, largest 6 mm. Non-contrast chest CT at 3-6 months is recommended. If the nodules are stable at time of repeat CT, then future CT at 18-24 months (from today's scan) is considered optional for low-risk  patients, but is recommended for high-risk patients. This recommendation follows the consensus statement: Guidelines for Management of Incidental Pulmonary Nodules Detected on CT Images:From the Fleischner Society 2017; published online before print (10.1148/radiol.1610960454). 3. Additional findings include mild-to-moderate centrilobular emphysema, aortic atherosclerosis and 2 vessel coronary atherosclerosis.   Electronically Signed   By: Ilona Sorrel M.D.   On: 02/16/2016 14:14   OV 08/31/2016  Chief Complaint  Patient presents with  . Follow-up    HRCT was never scheduled. Pt states that the cough is still present >> slightly improved since last OV. Pt states that she feels her allergies has gotten it ramped up again.    Follow-up multifactorial cough associated with autoimmune antibody positive, interstitial lung disease and emphysema previous history of smoking  After last visit in August 2017 she had high resolution CT chest in December 2017 that showed persistence of ILD. I personally visualized the CT chest. He comes for follow-up. She tells me that since August 2017 she has insidious onset of shortness of breath and is progressively worse. It is mild and present only on inclines is not therefore tenderness. In terms of her cough is persistent. It is associated with significant sinus drainage that she thinks is allergy related. She constantly clears her throat. Lab review shows autoimmune antibody positive in August 2017 with scleroderma antibody slightly elevated at 6.4. I referred her to rheumatology but she does not remember this and she did not make this follow-up. She also tells me that she definitely has a long history of [name] phenomena in her fingers but no one has ever formally diagnosed with connective tissu  e disease.  OV 11/28/2016  Chief Complaint  Patient presents with  . Follow-up    Pt here after PFT. Pt states her breathing is uchanged since last OV. Pt  c/o dry cough.- pt states this has slightly improved since last OV. Pt denies CP/tightness and f/c/s.     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 Since then she has seen rheumatologis Dr. Keturah Barre. I reviewd the notes and also discussed with the patient wa a good understading of what s going on. She tells me that some of the lupus antibodies have been positive correlating with Raynaud. Patient is on beta blocker for atrial fbrillation and it is the preference by the rheumatologist Dr. D that she switc to calcium channel blocker. She has a follow-up appointment pending with her electrophysiologist Dr. Caryl Comes.  She is scleroderma antibody positive but according to her rheumtolgist she does not have dermatologic  Manifestation of the disese. 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 She pulm  function test today and this is stable. In terms of arrest or symptoms she stable. The cough is actually improved. But she does get dyspneic walking up stairs especially a few flights. She does not wnt her to pulmonary habilitation because se exrcises on the treadmill although she does not monitor her saturations.   reports that she quit smoking about 19  years ago. She has a 42.00 pack-year smoking history. she has never used smokeless tobacco.   OV 06/12/2017  Chief Complaint  Patient presents with  . Follow-up    Pt states that she has been doing good since last visit. States that she has a "tickle cough" that is sporadic and has SOB that she states is when she climbs 2 flights of stairs. Denies any CP.    Follow-up combined emphysema was interstitial lung disease [autoimmune undifferentiated connective tissue disease interstitial lung disease]. Autoimmune features from May 2018 rheumatology noted with Dr. Keturah Barre: Raynaud phenomenon positive  without gangrene, positive anti-cardiolipin and positive IgM and SCL-70 positive without clinical features of scleroderma    Last seen June 2018. Since then she's stable. Overall she tells me that only problem is a tickle in her throat and slight cough. This is up and down depending on the pollen exposure she gets. She gets dyspneic for climbing few to several flights of stairs. This is unchanged. She did have full function test today and that shows mild significant worsening though overall gradient is only mild.. However she's not feeling this. She has appointment with Dr. Keturah Barre  pending    OV 07/18/2017  Chief Complaint  Patient presents with  . Follow-up    HRCT done 07/03/17.  Pt states she is the same as she was at last visit. SOB with exertion.    Follow-up combined emphysema was interstitial lung disease [autoimmune undifferentiated connective tissue disease interstitial lung disease]. Autoimmune features from May 2018 rheumatology noted with Dr. Keturah Barre: Raynaud phenomenon positive without gangrene, positive anti-cardiolipin and positive IgM and SCL-70 positive without clinical features of scleroderma    [name] presents for follow-up.  She is here to discuss the results of a high-resolution CT scan of the chest.  The scan was reviewed by Dr. Rosario Jacks thoracic radiology who feels that patient has probable UIP pattern that is definitely progressive compared to August 2017 although there is no comment about progression since June 2018 CT scan.  The pulmonary nodules itself are stable since August 2017 and are likely benign.  The change in the CT scan which is mild progression pulmonary fibrosis corresponds with the pulmonary function test that shows mild progression.  She is now here with her daughter Junie Panning and her husband.  She also states that 1 of her other daughters has rheumatoid arthritis and is on TNF alpha blockade.  All her 3 daughters have hemo-chromatosis   Lungs/Pleura: Moderate  centrilobular emphysema. Mild basilar predominant subpleural reticulation and ground-glass, increased from 02/16/2016. No traction bronchiectasis/ bronchiolectasis, architectural distortion or honeycombing. Side-by-side nodules in the medial left lower lobe measure up to 6 mm (series 3, image 95), unchanged from 02/16/2016 and considered benign. No air trapping. No pleural fluid. Airway is unremarkable.  IMPRESSION: 1. Mild progression in basilar predominant subpleural fibrosis from 02/16/2016, raising suspicion for usual interstitial pneumonitis. 2. Aortic atherosclerosis (ICD10-170.0). Moderate coronary artery calcification. 3.  Emphysema (ICD10-J43.9).   Electronically Signed   By: Lorin Picket M.D.   On: 07/03/2017 09:54   Results for ARNETTE, DRIGGS (MRN [id number]) as of 11/28/2016 12:08  Ref. Range 02/12/2016 12:57 11/28/2016 10:53 06/12/2017   FVC-Pre Latest Units: L 2.85 2.81 2.68  FVC-%Pred-Pre Latest Units: % 101 101 97    Results for TYIANA, HILL (MRN [id number]) as of 11/28/2016 12:08  Ref. Range 02/12/2016 12:57 11/28/2016 10:53 06/12/2017   DLCO cor Latest Units: ml/min/mmHg 14.54 13.36 12.17  DLCO cor %  pred Latest Units: % 60 55 50      has a past medical history of Atrial fibrillation ([place]), Chest pain, Depression with anxiety, GERD (gastroesophageal reflux disease), Hemochromatosis, History of syncope, and Hypothyroidism.   Review of Systems     Objective:   Physical Exam Discussion only visit    Assessment:       ICD-10-CM   1. ILD (interstitial lung disease) ([facility]) J84.9 QuantiFERON-TB Gold Plus    Hepatitis B Virus (Profile VI)    Hepatitis A Antibody, Total    Hepatitis C Antibody    Glucose 6 phosphate dehydrogenase  2. Pulmonary emphysema, unspecified emphysema type ([place]) J43.9   3. Scl-70 antibody positive R76.8   4. [name] phenomenon without gangrene I73.00    She has definite autoimmune ILD.  This is because of positive  antibody including scleroderma-70 and the Raynaud phenomena.  Compared to August 2017 it is progressive both on CT scan of the chest and pulmonary function test.  The rate of progression is only mild.  This is scleroderma-70 antibody predicts progression    Plan:     Given the evidence of progression particularly in the presence of scleroderma-70 antibody I think she should be on a immune modulator to slow down the rate of progression.  CellCept is probably the best first choice.  I discussed this and Bactrim.  As a prelude to starting the drug I will get a QuantiFERON gold test, hepatitis virus panel and G6PD blood test.  Meanwhile I will see if Dr. Estanislado Pandy can see her sooner.  I have sent a message as such.  Plan was discussed in detail with patient, her husband and daughter ERin.  The completely family with strong immune modulator therapy because of the fact her daughter is on Enbrel.   > 50% of this > 25 min visit spent in face to face counseling or coordination of care      Dr. Brand Males, M.D., [facility].C.P Pulmonary and Critical Care Medicine Staff Physician, [place] Director - Interstitial Lung Disease  Program  Pulmonary [place] at [place], [place], [address]  Pager: [phone], If no answer or between  15:00h - 7:00h: call [phone] Telephone: [phone]

## 2017-07-19 LAB — GLUCOSE 6 PHOSPHATE DEHYDROGENASE: G-6PDH: 18.6 U/g Hgb (ref 7.0–20.5)

## 2017-07-19 LAB — HEPATITIS C ANTIBODY
Hepatitis C Ab: NONREACTIVE
SIGNAL TO CUT-OFF: 0.08 (ref <1.00)

## 2017-07-19 LAB — HEPATITIS A ANTIBODY, TOTAL: HEPATITIS A AB,TOTAL: NONREACTIVE

## 2017-07-19 NOTE — Telephone Encounter (Signed)
I evaluated patient on 06/22/2017. Please see the progress note. If you need any further input please that me know. Thanks

## 2017-07-20 LAB — HEPATITIS B VIRUS (PROFILE VI)
HEP B E AG: NEGATIVE
HEP B S AG: NEGATIVE
Hep B C IgM: NEGATIVE
Hep B Core Total Ab: NEGATIVE
Hep B E Ab: NEGATIVE
Hep B Surface Ab, Qual: NONREACTIVE

## 2017-07-21 ENCOUNTER — Other Ambulatory Visit: Payer: Self-pay | Admitting: Internal Medicine

## 2017-07-23 NOTE — Progress Notes (Signed)
Cardiology Office Note Date:  07/26/2017  Patient ID:  Lataya, Varnell 01/08/1941, MRN [id number] PCP:  Chesley Noon, MD  Electrophysiologist:  Dr. Caryl Comes Hematologist: Dr. Beryle Beams Rheumatology: Dr. Estanislado Pandy Pulmonology: Dr. Chase Caller    Chief Complaint: planned 6 mo EP follow up  History of Present Illness: ALDEA AVIS is a 77 y.o. female with history of hemachromatosis, GERD, depression, hypothyroidism, Reynaud's, and PAFib on Flecainide/Toprol.  She comes today to be seen for Dr. Caryl Comes, last seen by him in July 2018, at that time doing well on her a/c, her BB was changed to CCB it seems 2/2 her Reynaud's.  Shre reports being recently found with Pulmonary Fibrosis, is seeing Dr. Chase Caller and [facility] get started on Cell cept soon.  She is also following with Rheumatology, Dr. Estanislado Pandy she says 2/2 her Reynaud's and + antibody for scleroderma.  I note she remains on metoprolol, she states she recalls having the discussion abot making a change at her last visit, she says prompted by rheumatology sho felt that may diminish her hand symptoms with her Reynaud's though never happened.    She denies any change or escalation in her symptoms over the years, and only affects her hands.  We discussed changing her off the BB to another family of medicine though she prefers not to at this point starting new therapies for her pulmonary fibrosis.  Cardia-wise, she has infrequent and fleeting palpitations, she does not think she has had any AFib, no CP, no rest SOB, no symptoms of PND or orthopnea.  She has some degree of SOE that prompted the pulmonary evaluation and finding of fibrosis.  No bloating/wtare retention or swelling.  She was getting nose bleeds with the onset of winter, though the addition of flonase/saline spray have resolved these, no other bleeding or signs of bleeding  Past Medical History:  Diagnosis Date  . Atrial fibrillation ([place])    a. Flecainide  therapy;  b. event monitor 4/12  . Chest pain    a. GXT myoview 4/12: no isch., EF 86%;   b. echo 4/12: EF 55-65%, grade 1 diast dysfxn, LAE  . Depression with anxiety   . GERD (gastroesophageal reflux disease)   . 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 Beryle Beams  . History of syncope   . Hypothyroidism     Past Surgical History:  Procedure Laterality Date  . MOUTH SURGERY    . SHOULDER ARTHROSCOPY Left   . TOTAL ABDOMINAL HYSTERECTOMY    . VARICOSE VEIN SURGERY      Current Outpatient Medications  Medication Sig Dispense Refill  . Biotin 1000 MCG tablet Take 1,000 mcg by mouth daily.    . Calcium Carbonate 1500 MG TABS Take 1 tablet by mouth 3 (three) times a week.    . Cholecalciferol (VITAMIN D PO) Take 5,000 Units by mouth daily.     . flecainide (TAMBOCOR) 100 MG tablet TAKE ONE-HALF TABLET BY  MOUTH IN THE MORNING AND  ONE-HALF TABLET IN THE  EVENING 90 tablet 1  . fluticasone (FLONASE) 50 MCG/ACT nasal spray Place 2 sprays into both nostrils daily. 16 g 5  . folic acid (FOLVITE) 1 MG tablet Take 1 mg by mouth daily.      Marland Kitchen levothyroxine (SYNTHROID, LEVOTHROID) 112 MCG tablet Take 112 mcg by mouth daily.      . methocarbamol (ROBAXIN) 500 MG tablet TAKE 1 TABLET BY MOUTH AT  BEDTIME AS NEEDED FOR  MUSCLE  SPASM(S) 30 tablet 0  . methocarbamol (ROBAXIN) 750 MG tablet Take 1 tablet (750 mg total) by mouth at bedtime as needed for muscle spasms. 30 tablet 2  . metoprolol succinate (TOPROL-XL) 25 MG 24 hr tablet TAKE 1 TABLET BY MOUTH  DAILY 90 tablet 2  . omeprazole (PRILOSEC) 40 MG capsule daily.    Marland Kitchen SPIRIVA RESPIMAT 2.5 MCG/ACT AERS INHALE 2 PUFFS INTO THE LUNGS DAILY 4 g 0  . VITAMIN B1-B12 IM Inject 1,000 mcg into the muscle every 30 (thirty) days.     Marland Kitchen VITAMIN E PO Take 1 tablet by mouth daily.     Alveda Reasons 20 MG TABS tablet TAKE 1 TABLET BY MOUTH ONCE DAILY WITH FOOD 90 tablet 1   No current facility-administered medications for this visit.       Allergies:   Patient has no known allergies.   Social History:  The patient  reports that she quit smoking about 20 years ago. She has a 42.00 pack-year smoking history. she has never used smokeless tobacco. She reports that she drinks about 0.6 oz of alcohol per week. She reports that she does not use drugs.   Family History:  The patient's family history includes Heart disease (age of onset: 69) in her father; Hemochromatosis in her brother; Stroke in her mother.  ROS:  Please see the history of present illness.  All other systems are reviewed and otherwise negative.   PHYSICAL EXAM:  VS:  BP (!) 148/74   Ht 5' 3.5" (1.613 m)   Wt 147 lb (66.7 kg)   BMI 25.63 kg/m  BMI: Body mass index is 25.63 kg/m. Well nourished, well developed, thin body habitus,  in no acute distress  HEENT: normocephalic, atraumatic  Neck: no JVD, carotid bruits or masses Cardiac:  RRR; no significant murmurs, no rubs, or gallops Lungs:  CTA b/l, no wheezing, rhonchi or rales  Abd: soft, nontender MS: no deformity, age appropriate atrophy Ext:  no edema, 2-3+ palpable pedal pulses b/l, no skin changes or cyanosis is appreciate, no cyanosis hands b/l Skin: warm and dry, no rash Neuro:  No gross deficits appreciated Psych: euthymic mood, full affect   EKG:  Done today and reviewed by myself SB, 58bpm PR 119ms, QRS 78ms, QTc 412ms  10/14/10: TTE Study Conclusions - Left ventricle: The cavity size was normal. There was mild focal basal hypertrophy of the septum. Systolic function was normal. The estimated ejection fraction was in the range of 55% to 65%. Wall motion was normal; there were no regional wall motion abnormalities. Doppler parameters are consistent with abnormal left ventricular relaxation (grade 1 diastolic dysfunction). - Mitral valve: Calcified annulus. - Left atrium: The atrium was mildly dilated.  Recent Labs: 06/22/2017: ALT 9; BUN 10; Creat 0.68; Hemoglobin 12.1;  Platelets 249; Potassium 4.5; Sodium 139  No results found for requested labs within last 8760 hours.   CrCl cannot be calculated (Patient's most recent lab result is older than the maximum 21 days allowed.).   Wt Readings from Last 3 Encounters:  07/26/17 147 lb (66.7 kg)  07/18/17 145 lb 12.8 oz (66.1 kg)  06/22/17 149 lb (67.6 kg)     Other studies reviewed: Additional studies/records reviewed today include: summarized above  ASSESSMENT AND PLAN:  1. Paroxysmal AFib     CHA2DS2Vasc is 3, on Xarelto, appropriately dosed by labs last month w/Calc Cr. Cl 73     On Flecainide w/ metoprolol     stable intervals  The  patient will revisit with her rheumatologist/MDs, if felt she is better served with a CCB we can make that switch, though the patient feels like for the most part things are is a steady place would prefer not to make any changes.  Disposition: F/u with EP in 6 months, sooner if needed.  Current medicines are reviewed at length with the patient today.  The patient did not have any concerns regarding medicines.  Venetia Night, PA-C 07/26/2017 11:52 AM     [facility] [place] [address] [phone] (office)  [phone] (fax)

## 2017-07-24 ENCOUNTER — Ambulatory Visit (INDEPENDENT_AMBULATORY_CARE_PROVIDER_SITE_OTHER): Payer: Medicare Other | Admitting: Orthopaedic Surgery

## 2017-07-25 ENCOUNTER — Telehealth: Payer: Self-pay | Admitting: Internal Medicine

## 2017-07-25 NOTE — Telephone Encounter (Signed)
Pt is requesting lab results from 07/18/17.  MR - please advise. Thanks.

## 2017-07-26 ENCOUNTER — Ambulatory Visit (INDEPENDENT_AMBULATORY_CARE_PROVIDER_SITE_OTHER): Payer: Medicare Other | Admitting: Physician Assistant

## 2017-07-26 VITALS — BP 148/74 | HR 58 | Ht 63.5 in | Wt 147.0 lb

## 2017-07-26 DIAGNOSIS — I480 Paroxysmal atrial fibrillation: Secondary | ICD-10-CM

## 2017-07-26 DIAGNOSIS — Z79899 Other long term (current) drug therapy: Secondary | ICD-10-CM

## 2017-07-26 NOTE — Addendum Note (Signed)
Addended by: Claude Manges on: 07/26/2017 02:39 PM   Modules accepted: Orders

## 2017-07-26 NOTE — Patient Instructions (Signed)
Medication Instructions:   Your physician recommends that you continue on your current medications as directed. Please refer to the Current Medication list given to you today.    If you need a refill on your cardiac medications before your next appointment, please call your pharmacy.  Labwork:  NONE ORDERED  TODAY    Testing/Procedures: NONE ORDERED  TODAY    Follow-Up:  Your physician wants you to follow-up in:  IN  [place] will receive a reminder letter in the mail two months in advance. If you don't receive a letter, please call our office to schedule the follow-up appointment.      Any Other Special Instructions Will Be Listed Below (If Applicable).

## 2017-07-27 NOTE — Telephone Encounter (Signed)
Called pt letting her know that MR has been out of the office this week and has not had a chance to look at her labwork from the previous day.  Also stated to pt that MR did send a message to Dr. Estanislado Pandy and Dr. Estanislado Pandy sent another message back to MR and was waiting to hear from him if anything else needed to be pursued on her end.  Told pt we would call her as soon as we could once MR has viewed her labwork.  Pt expressed understanding. Will route this message to MR for him to view.

## 2017-07-31 ENCOUNTER — Telehealth: Payer: Self-pay | Admitting: Internal Medicine

## 2017-07-31 DIAGNOSIS — J849 Interstitial pulmonary disease, unspecified: Secondary | ICD-10-CM

## 2017-07-31 NOTE — Telephone Encounter (Signed)
See other note  Dr. Brand Males, M.D., [facility].C.P Pulmonary and Critical Care Medicine Staff Physician, [place] Director - Interstitial Lung Disease  Program  Pulmonary [place] at [place], [place], [address]  Pager: [phone], If no answer or between  15:00h - 7:00h: call [phone] Telephone: [phone]

## 2017-07-31 NOTE — Telephone Encounter (Signed)
Regarding  blood work. My apologies for the delay  - hepatiis virus  Panel - NEGATIVE - good - G6PDH - normal ; good - Quantiferon GOLD - did your order?   Thanks  Dr. Brand Males, M.D., [facility]C.P Pulmonary and Critical Care Medicine Staff Physician, [place] Director - Interstitial Lung Disease  Program  Pulmonary [place] at [place], [place], [address]  Pager: [phone], If no answer or between  15:00h - 7:00h: call [phone] Telephone: [phone]     Results for [name], [name] (MRN [id number]) as of 07/31/2017 17:11  Ref. Range 07/18/2017 17:27  Hepatitis B Surface Ag Latest Ref Range: Negative  Negative  Hep B Core Ab, Tot Latest Ref Range: Negative  Negative  Hep B Core Ab, IgM Latest Ref Range: Negative  Negative  Hep B E Ab Latest Ref Range: Negative  Negative  Hep B E Ag Latest Ref Range: Negative  Negative  Hep B Surface Ab, Qual Unknown Non Reactive  Hepatitis C Ab Latest Ref Range: NON-REACTI  NON-REACTIVE  Results for [name], [name] (MRN [id number]) as of 07/31/2017 17:18  Ref. Range 07/18/2017 17:27  G-6PDH Latest Ref Range: 7.0 - 20.5 U/g Hgb 18.6

## 2017-07-31 NOTE — Telephone Encounter (Signed)
Pt was in office today, 07/31/17 with her husband who was in for an OV with MW.  Pt stated to me that she was still waiting to hear from MR if pt was to start on Cellcept.  MR stated he discussed with Dr. Estanislado Pandy pt's recent visit and her thought on pt beginning Cellcept and MR stated Dr. Estanislado Pandy has left it up to MR regarding the Cellcept.  Stated to pt that MR needs to discuss with a pharmacist to make sure it would be safe for pt to take cellcept along with her other meds.  Also stated to pt that MR had mentioned sending pt for a referral to [facility] if pt would be okay with that.  Pt stated she would be fine for the referral to be placed.  MR, pt states she still is waiting to know the results of her recent labwork.  MR, please advise on all the above.  Thanks!

## 2017-08-01 NOTE — Telephone Encounter (Signed)
The quantiferon gold was ordered on 07/18/17 along with all the other labs.  Just looked back at pt's lab tab and I am seeing where it is stating it still needs to be collected.  I do not know why they did not collect this lab when they did all the other ones as the order was placed for it.  The only thing I can think is if it was not made as a future lab and they did not see it.  But with that, they did not collect the lab.

## 2017-08-03 NOTE — Telephone Encounter (Signed)
Called and spoke with pt giving her the results of her labwork.  MR, again I do not know why quantiferon was not collected due to it being ordered.  Pt is coming in for a visit 08/29/17.  If you want [name] to reorder it at that visit, let me know and we can.  Pt is wanting the referral to [facility]. If you are fine with me placing the order, just specify for the exact reason for the referral.  Pt is also waiting to hear from you after talking with pharmacist regarding the cellcept to see if it would be safe for her to take along with all her current meds.  If it is fine for pt to take the cellcept, pt is wanting to start med March 5 due to arriving back home after a trip to [place] and is not wanting to risk experiencing side effects while out of state.  MR, please advise on all this.  Thanks!

## 2017-08-04 NOTE — Telephone Encounter (Signed)
Let [name] know that  # I d/w Hughes Better a hospital pharmacist. Per our discussion ok to start cellcept and prednisone. However  A. When we do that the prilosec will have to be changed to pepcid or zantac B. If she is on maalox for any reason should be avoided C. Apologize for whateer reason the lab dropped ball on Quantiferon Gold. So she needs to do this. Once this is negative (results out in few days), then I can send in cellcept, bactrim, prednisone   #[facility] - I am going to hold off on that for now. Can discuss at fu  Dr. Brand Males, M.D., [facility].C.P Pulmonary and Critical Care Medicine Staff Physician, [place] Director - Interstitial Lung Disease  Program  Pulmonary [place] at [place], [place], [address]  Pager: [phone], If no answer or between  15:00h - 7:00h: call [phone] Telephone: [phone]

## 2017-08-04 NOTE — Telephone Encounter (Signed)
Attempted to call pt but pt was not home.  Spoke with pt's husband Ronalee Belts and asked him to have pt call our office once she arrived home so I could go over some information I found out per MR.  Ronalee Belts expressed understanding. Will await pt's call to go over this info.

## 2017-08-04 NOTE — Telephone Encounter (Signed)
Pt returned my call and I explained the conversation that MR had with pharmacist Hughes Better about it being fine for her to do the cellcept along with the prednisone but we would need to change her omeprazole to either pepcid or zantac once she began the cellcept.  Stated to her she would be doing bactrim along with the cellcept but we would need her to come have quantiferon gold labwork done first.  Stated to her we had ordered this when she had the other labwork done but for some reason this was not collected.  Pt expressed understanding and stated she would come by sometime next week to have labwork done.  I stated to pt once we had the labwork back and result was negative we could then send Rx's to her preferred pharmacy.  Pt expressed understanding. Lab order sent. Nothing further needed at this current time.

## 2017-08-10 ENCOUNTER — Other Ambulatory Visit: Payer: Medicare Other

## 2017-08-10 DIAGNOSIS — J849 Interstitial pulmonary disease, unspecified: Secondary | ICD-10-CM

## 2017-08-11 ENCOUNTER — Telehealth: Payer: Self-pay | Admitting: Internal Medicine

## 2017-08-11 LAB — QUANTIFERON-TB GOLD PLUS
NIL: 0.07 [IU]/mL
QuantiFERON-TB Gold Plus: NEGATIVE
TB1-NIL: 0.14 [IU]/mL
TB2-NIL: 0.08 [IU]/mL

## 2017-08-11 NOTE — Telephone Encounter (Signed)
Quantiferon gold negative  Plan Start cellcept /bactrim/prednisone for Scleroderma ILD  Plan CELLCEPT  - Start mycophenolate mofetil 500mg  twice a day  - Oral dosage formulations (tablet, capsule, suspension) should be administered on an empty stomach (1 hour before or 2 hours after meals) to avoid variability in MPA absorption.  - Oral suspension should not be mixed with other medications.  - Delayed release tablets should not be crushed, cut, or chewed.  - - If a dose is missed, administer as soon as it is remembered.  - If it is close to the next scheduled dose, skip the missed dose and resume at next regularly scheduled time; do not double a dose to make up for a missed dos    #Start Bactrim 1 DS tablet Monday, Wed, Friday   #start prednisone 10mg  per day  #concomitant meds  - stop prilosec because it reduces cellcept effectiveness. isntead take OTC ranitidine 300mg  at night  - always try to avoid maalox   #followup 2 weeks after starting cellcept - to return to ILD clinic   Dr. Brand Males, M.D., [facility].C.P Pulmonary and Critical Care Medicine Staff Physician, [place] Director - Interstitial Lung Disease  Program  Pulmonary [place] at [place], [place], [address]  Pager: [phone], If no answer or between  15:00h - 7:00h: call [phone] Telephone: [phone]

## 2017-08-16 MED ORDER — SULFAMETHOXAZOLE-TRIMETHOPRIM 800-160 MG PO TABS: ORAL_TABLET | ORAL | 5 refills | Status: DC

## 2017-08-16 MED ORDER — PREDNISONE 10 MG PO TABS: 10.0000 mg | ORAL_TABLET | Freq: Every day | ORAL | 5 refills | Status: DC

## 2017-08-16 MED ORDER — MYCOPHENOLATE MOFETIL 500 MG PO TABS: 500.0000 mg | ORAL_TABLET | Freq: Two times a day (BID) | ORAL | 5 refills | Status: DC

## 2017-08-16 NOTE — Telephone Encounter (Signed)
Called pt to give her all of this information regarding her starting cellcept, bactrim, and prednisone to treat her scleroderma ILD.  Pt does have an appt currently scheduled with MR 08/29/17 which was a follow-up visit previously scheduled.  Due to pt having questions regarding the cellcept,bactrim,and prednisone along with some of the other meds she is currently taking, will keep pt's current appt that is scheduled.  Pt is not wanting to start taking the meds until after she returns back from her trip.  Pt leaves 08/25/17 and returns 08/28/17 and after the OV with MR 08/29/17 having questions answered, pt will then begin taking the meds.  Am going to go ahead and send meds to pt's preferred pharmacy so she will have the meds already once her OV is done on 08/29/17.  Am also going to send all the instructions per MR that was stated regarding meds to pt in the mail at her address we have on file so she can have all the instructions to view.  Scripts sent to pt's preferred pharmacy of all medications stated per MR. Will have pt discuss this further at her current OV 08/29/17.  Nothing further needed at this current time.

## 2017-08-17 ENCOUNTER — Other Ambulatory Visit: Payer: Self-pay | Admitting: Internal Medicine

## 2017-08-24 ENCOUNTER — Other Ambulatory Visit: Payer: Self-pay | Admitting: Oncology

## 2017-08-29 ENCOUNTER — Encounter: Payer: Self-pay | Admitting: Internal Medicine

## 2017-08-29 ENCOUNTER — Ambulatory Visit (INDEPENDENT_AMBULATORY_CARE_PROVIDER_SITE_OTHER): Payer: Medicare Other | Admitting: Internal Medicine

## 2017-08-29 VITALS — BP 138/72 | HR 63 | Ht 63.5 in | Wt 149.0 lb

## 2017-08-29 DIAGNOSIS — R768 Other specified abnormal immunological findings in serum: Secondary | ICD-10-CM | POA: Diagnosis not present

## 2017-08-29 DIAGNOSIS — J849 Interstitial pulmonary disease, unspecified: Secondary | ICD-10-CM

## 2017-08-29 DIAGNOSIS — Z23 Encounter for immunization: Secondary | ICD-10-CM

## 2017-08-29 DIAGNOSIS — I7300 Raynaud's syndrome without gangrene: Secondary | ICD-10-CM

## 2017-08-29 NOTE — Progress Notes (Signed)
Subjective:     Patient ID: [name], female   DOB: [birth year], 77 y.o.   MRN: [id number]  HPI     PCP [name],[name], PA-C   HPI  IOV 02/10/2016  Chief Complaint  Patient presents with  . Pulmonary Consult    Pt referred by Dr. Melford Aase for chronic cough x 1 year. Pt states she feels she has a tickle that is causing her dry cough. Pt states she has DOE when climbing stairs. Pt deneis CP/tightness.     77 year old female with hemochromatosis homozygous gene followed by Dr. Beryle Beams (last phlebotomy many years ago and is on serial monitoring) with children and siblings with active disease. She has also atrial fibrillation followed by Dr. Caryl Comes. Reports insidious onset of chronic cough in the last 1 year. It is stable since onset. It fluctuates between mild and severe in severity. It is mostly dry in quality. Mostly present in the daytime but sometimes also wakes her up at night. It is definitely not progressive. It is episodic and present every day. Aggravated by talking sometimes and then the throat feels dry associated with tickle in the throat and also sensation the cough is coming from the upper chest retrosternally and sometimes relieved by drinking water or chewing on a lozenge. Also aggravated by seasonal changes particularly in the spring and the fall which makes her think she has allergies. There is sometimes associated gag. She also reports nonspecific occasional wheezing and associated shortness of breath that is nonspecific but present with exertion and relieved by rest No other clear cut aggravating or relieving factors.   cough associated history  - Medications: She is not on fish oil or ACE inhibitors - Sinus drainage: She does admit to spring allergies. She did have something removed from her hard palate several years ago by ENT does not know details - Acid reflux: She says she is significant acid reflux and is on Prilosec. Without which she'll have significant  symptoms - Pulmonary disease: FenO 12 02/10/2016  and normal. She denies any personal history of asthma or pulmonary fibrosis of COPD or emphysema smoked one pack per day started smoking at age 77 and quit in 1999. Making a 42 pack smoking history. Chest x-ray 08/26/2014 personally visualized is clear - Tobacco :  reports that she quit smoking about 18 years ago. She has a 42.00 pack-year smoking history. She has never used smokeless tobacco.       OV 02/18/2016  Chief Complaint  Patient presents with  . Follow-up    Pt here after PFT and HRCT. Pt denies changes in SOB and cough. Pt denies any new complaints at this time.    Follow-up chronic cough. This visit is to follow-up on test results of function test and high resolution CT chest which are described below9 results show isolated reduction in diffusion capacity which can be explained by possible early ILD and also emphysema. There are also new findings of nodule 6 mm bilaterally on lower lobes. She prefers a very cautious approach to this evaluation.     Pulmonary function test 02/12/2016 - FVC 2.8 cm/101%, FEV1 1.9 L/91%. Ratio 60/90%. Total lung capacity 115%. DLCO reduced at 14.5/60%. She is isolated reduction in diffusion capacity    HRCT chest 02/16/16 IMPRESSION: 1. Mild patchy subpleural reticulation in both lungs with a basilar predominance. No significant traction bronchiectasis. No frank honeycombing. Findings could represent an interstitial lung disease such as nonspecific interstitial pneumonia (NSIP), with early usual interstitial pneumonia (  UIP) not excluded. A follow-up high-resolution chest CT study in 12 months is recommended to assess temporal pattern stability. 2. Bilateral lower lobe solid pulmonary nodules, largest 6 mm. Non-contrast chest CT at 3-6 months is recommended. If the nodules are stable at time of repeat CT, then future CT at 18-24 months (from today's scan) is considered optional for low-risk  patients, but is recommended for high-risk patients. This recommendation follows the consensus statement: Guidelines for Management of Incidental Pulmonary Nodules Detected on CT Images:From the Fleischner Society 2017; published online before print (10.1148/radiol.8546270350). 3. Additional findings include mild-to-moderate centrilobular emphysema, aortic atherosclerosis and 2 vessel coronary atherosclerosis.   Electronically Signed   By: Ilona Sorrel M.D.   On: 02/16/2016 14:14   OV 08/31/2016  Chief Complaint  Patient presents with  . Follow-up    HRCT was never scheduled. Pt states that the cough is still present >> slightly improved since last OV. Pt states that she feels her allergies has gotten it ramped up again.    Follow-up multifactorial cough associated with autoimmune antibody positive, interstitial lung disease and emphysema previous history of smoking  After last visit in August 2017 she had high resolution CT chest in December 2017 that showed persistence of ILD. I personally visualized the CT chest. He comes for follow-up. She tells me that since August 2017 she has insidious onset of shortness of breath and is progressively worse. It is mild and present only on inclines is not therefore tenderness. In terms of her cough is persistent. It is associated with significant sinus drainage that she thinks is allergy related. She constantly clears her throat. Lab review shows autoimmune antibody positive in August 2017 with scleroderma antibody slightly elevated at 6.4. I referred her to rheumatology but she does not remember this and she did not make this follow-up. She also tells me that she definitely has a long history of [name] phenomena in her fingers but no one has ever formally diagnosed with connective tissu  e disease.  OV 11/28/2016  Chief Complaint  Patient presents with  . Follow-up    Pt here after PFT. Pt states her breathing is uchanged since last OV. Pt  c/o dry cough.- pt states this has slightly improved since last OV. Pt denies CP/tightness and f/c/s.     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 Since then she has seen rheumatologis Dr. Keturah Barre. I reviewd the notes and also discussed with the patient wa a good understading of what s going on. She tells me that some of the lupus antibodies have been positive correlating with Raynaud. Patient is on beta blocker for atrial fbrillation and it is the preference by the rheumatologist Dr. D that she switc to calcium channel blocker. She has a follow-up appointment pending with her electrophysiologist Dr. Caryl Comes.  She is scleroderma antibody positive but according to her rheumtolgist she does not have dermatologic  Manifestation of the disese. 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 She pulm  function test today and this is stable. In terms of arrest or symptoms she stable. The cough is actually improved. But she does get dyspneic walking up stairs especially a few flights. She does not wnt her to pulmonary habilitation because se exrcises on the treadmill although she does not monitor her saturations.   reports that she quit smoking about  19 years ago. She has a 42.00 pack-year smoking history. she has never used smokeless tobacco.   OV 06/12/2017  Chief Complaint  Patient presents with  . Follow-up    Pt states that she has been doing good since last visit. States that she has a "tickle cough" that is sporadic and has SOB that she states is when she climbs 2 flights of stairs. Denies any CP.    Follow-up combined emphysema was interstitial lung disease [autoimmune undifferentiated connective tissue disease interstitial lung disease]. Autoimmune features from May 2018 rheumatology noted with Dr. Keturah Barre: Raynaud phenomenon positive  without gangrene, positive anti-cardiolipin and positive IgM and SCL-70 positive without clinical features of scleroderma    Last seen June 2018. Since then she's stable. Overall she tells me that only problem is a tickle in her throat and slight cough. This is up and down depending on the pollen exposure she gets. She gets dyspneic for climbing few to several flights of stairs. This is unchanged. She did have full function test today and that shows mild significant worsening though overall gradient is only mild.. However she's not feeling this. She has appointment with Dr. Keturah Barre  pending    OV 07/18/2017  Chief Complaint  Patient presents with  . Follow-up    HRCT done 07/03/17.  Pt states she is the same as she was at last visit. SOB with exertion.    Follow-up combined emphysema was interstitial lung disease [autoimmune undifferentiated connective tissue disease interstitial lung disease]. Autoimmune features from May 2018 rheumatology noted with Dr. Keturah Barre: Raynaud phenomenon positive without gangrene, positive anti-cardiolipin and positive IgM and SCL-70 positive without clinical features of scleroderma    [name] presents for follow-up.  She is here to discuss the results of a high-resolution CT scan of the chest.  The scan was reviewed by Dr. Rosario Jacks thoracic radiology who feels that patient has probable UIP pattern that is definitely progressive compared to August 2017 although there is no comment about progression since June 2018 CT scan.  The pulmonary nodules itself are stable since August 2017 and are likely benign.  The change in the CT scan which is mild progression pulmonary fibrosis corresponds with the pulmonary function test that shows mild progression.  She is now here with her daughter Junie Panning and her husband.  She also states that 1 of her other daughters has rheumatoid arthritis and is on TNF alpha blockade.  All her 3 daughters have hemo-chromatosis    OV 08/29/2017  Chief  Complaint  Patient presents with  . Follow-up    Pt states she has been doing good since last visit. Pt recently went to [place] and had some mild problems with SOB due to heat.  Pt was prescribed cellcept and bactrim but has not yet started meds due to questions with other meds she is taking.    77 year old female with ILD secondary to autoimmune disease clinically suspicious of scleroderma but also previous history of hemochromatosis with family history of hemochromatosis.  She is here with her husband.  This visit is only a discussion visit.  Because she has many questions about starting CellCept before she actually does.  We did hepatitis virus panel, QuantiFERON gold, G6PD and all this is normal.  Lab work is normal.  I checked with [facility] pharmacist about interactions and was cleared for her to start CellCept.  The only recommendation was for patient to come off Prilosec because of reduced effect of CellCept.  Patient  questions revolved around CellCept: She does not want to come off PPI because of hiatal hernia and has had bad acid reflux.  So I would not Willis Modena asking for any alternative PPI.  In addition she states she takes multiple multivitamins including Biotene, vitamin D3, vitamin D, vitamin D14 and folic acid.  She also takes Allegra occasionally.  She wants to make sure it is all okay with CellCept.  She has not had a flu shot today and she is asking if she should have it.  She has never had flu shot before.  She is also wondering about anticoagulation in the setting of CellCept and thyroid issues in the setting of Bactrim.  In terms of her genetics: Initially I corresponded with our local geneticist who thought she might be better served at [facility] but the [facility] genetics person wrote to me saying that patient should first be seen by [facility] rheumatology and hematology.  Patient's not so sure she wants to see the subspecialist.  She will speak to Dr.  Beryle Beams her local hematologist to see if it is worthwhile seeing the hematology department at [facility].   Lungs/Pleura: Moderate centrilobular emphysema. Mild basilar predominant subpleural reticulation and ground-glass, increased from 02/16/2016. No traction bronchiectasis/ bronchiolectasis, architectural distortion or honeycombing. Side-by-side nodules in the medial left lower lobe measure up to 6 mm (series 3, image 95), unchanged from 02/16/2016 and considered benign. No air trapping. No pleural fluid. Airway is unremarkable.  IMPRESSION: 1. Mild progression in basilar predominant subpleural fibrosis from 02/16/2016, raising suspicion for usual interstitial pneumonitis. 2. Aortic atherosclerosis (ICD10-170.0). Moderate coronary artery calcification. 3.  Emphysema (ICD10-J43.9).   Electronically Signed   By: Lorin Picket M.D.   On: 07/03/2017 09:54   Results for Tara, [name] (MRN [id number]) as of 11/28/2016 12:08  Ref. Range 02/12/2016 12:57 11/28/2016 10:53 06/12/2017   FVC-Pre Latest Units: L 2.85 2.81 2.68  FVC-%Pred-Pre Latest Units: % 101 101 97    Results for Tara, [name] (MRN [id number]) as of 11/28/2016 12:08  Ref. Range 02/12/2016 12:57 11/28/2016 10:53 06/12/2017   DLCO cor Latest Units: ml/min/mmHg 14.54 13.36 12.17  DLCO cor % pred Latest Units: % 60 55 50      has a past medical history of Atrial fibrillation ([place]), Chest pain, Depression with anxiety, GERD (gastroesophageal reflux disease), Hemochromatosis, History of syncope, and Hypothyroidism.   reports that she quit smoking about 20 years ago. She has a 42.00 pack-year smoking history. she has never used smokeless tobacco.  Past Surgical History:  Procedure Laterality Date  . MOUTH SURGERY    . SHOULDER ARTHROSCOPY Left   . TOTAL ABDOMINAL HYSTERECTOMY    . VARICOSE VEIN SURGERY      No Known Allergies  Immunization History  Administered Date(s) Administered  . Pneumococcal  Conjugate-13 06/12/2014  . Pneumococcal Polysaccharide-23 06/27/2010  . Tdap 03/16/2011  . Zoster 06/27/2010    Family History  Problem Relation Age of Onset  . Stroke Mother   . Heart disease Father 97  . Hemochromatosis Brother      Current Outpatient Medications:  .  Biotin 1000 MCG tablet, Take 1,000 mcg by mouth daily., Disp: , Rfl:  .  Calcium Carbonate 1500 MG TABS, Take 1 tablet by mouth 3 (three) times a week., Disp: , Rfl:  .  Cholecalciferol (VITAMIN D PO), Take 5,000 Units by mouth daily. , Disp: , Rfl:  .  flecainide (TAMBOCOR) 100 MG tablet, TAKE ONE-HALF TABLET BY  MOUTH IN THE MORNING AND  ONE-HALF TABLET IN THE  EVENING, Disp: 90 tablet, Rfl: 1 .  fluticasone (FLONASE) 50 MCG/ACT nasal spray, Place 2 sprays into both nostrils daily., Disp: 16 g, Rfl: 5 .  folic acid (FOLVITE) 1 MG tablet, Take 1 mg by mouth daily.  , Disp: , Rfl:  .  levothyroxine (SYNTHROID, LEVOTHROID) 112 MCG tablet, Take 112 mcg by mouth daily.  , Disp: , Rfl:  .  methocarbamol (ROBAXIN) 500 MG tablet, TAKE 1 TABLET BY MOUTH AT  BEDTIME AS NEEDED FOR  MUSCLE SPASM(S), Disp: 30 tablet, Rfl: 0 .  methocarbamol (ROBAXIN) 750 MG tablet, Take 1 tablet (750 mg total) by mouth at bedtime as needed for muscle spasms., Disp: 30 tablet, Rfl: 2 .  metoprolol succinate (TOPROL-XL) 25 MG 24 hr tablet, TAKE 1 TABLET BY MOUTH  DAILY, Disp: 90 tablet, Rfl: 2 .  predniSONE (DELTASONE) 10 MG tablet, Take 1 tablet (10 mg total) by mouth daily with breakfast., Disp: 60 tablet, Rfl: 5 .  SPIRIVA RESPIMAT 2.5 MCG/ACT AERS, INHALE 2 PUFFS INTO THE LUNGS DAILY, Disp: 4 g, Rfl: 3 .  VITAMIN B1-B12 IM, Inject 1,000 mcg into the muscle every 30 (thirty) days. , Disp: , Rfl:  .  VITAMIN E PO, Take 1 tablet by mouth daily. , Disp: , Rfl:  .  XARELTO 20 MG TABS tablet, TAKE 1 TABLET BY MOUTH ONCE DAILY WITH FOOD, Disp: 90 tablet, Rfl: 1 .  mycophenolate (CELLCEPT) 500 MG tablet, Take 1 tablet (500 mg total) by mouth 2 (two)  times daily. (Patient not taking: Reported on 08/29/2017), Disp: 60 tablet, Rfl: 5 .  sulfamethoxazole-trimethoprim (BACTRIM DS,SEPTRA DS) 800-160 MG tablet, Take 1tablet Monday, 1tablet Wednesday, 1tablet Friday (Patient not taking: Reported on 08/29/2017), Disp: 60 tablet, Rfl: 5   Review of Systems     Objective:   Physical Exam Vitals:   08/29/17 1332  BP: 138/72  Pulse: 63  SpO2: 99%  Weight: 149 lb (67.6 kg)  Height: 5' 3.5" (1.613 m)    Estimated body mass index is 25.98 kg/m as calculated from the following:   Height as of this encounter: 5' 3.5" (1.613 m).   Weight as of this encounter: 149 lb (67.6 kg).     Assessment:       ICD-10-CM   1. ILD (interstitial lung disease) ([place]) J84.9   2. Scl-70 antibody positive R76.8   3. [name] phenomenon without gangrene I73.00     8% decline in FVC/DlCO Between 2017 -> 2018    Plan:       Hve sent additional cellcept bactrim questions to phramacist; await response  High dose flu shot 08/29/2017  Gave you information on specialists at [facility] but check with Dr Darnell Level if worth going there because the geneticist at [facility] did not feel you should start with the geneticist  Start cellcept 500mg  twice daily with bactrim 3 times a week   Followup 2-3 weeks with DR Chase Caller (after starting cellcept)    - at followup do cbc, bmet, mag, phos, lft and basic spirometry with dlco    > 50% of this > 25 min visit spent in face to face counseling or coordination of care    Dr. Brand Males, M.D., [facility].C.P Pulmonary and Critical Care Medicine Staff Physician, [place] Director - Interstitial Lung Disease  Program  Pulmonary [place] at [place], [place], [address]  Pager: [phone], If no answer  or between  15:00h - 7:00h: call [phone] Telephone: [phone]

## 2017-08-29 NOTE — Patient Instructions (Addendum)
ICD-10-CM   1. ILD (interstitial lung disease) ([place]) J84.9   2. Scl-70 antibody positive R76.8   3. [name] phenomenon without gangrene I73.00      Hve sent additional cellcept bactrim questions to phramacist; await response  High dose flu shot 08/29/2017  Gave you information on specialists at [facility] but check with Dr Darnell Level if worth going there because the geneticist at [facility] did not feel you should start with the geneticist  Start cellcept 500mg  twice daily with bactrim 3 times a week   Followup 2-3 weeks with DR Chase Caller (after starting cellcept)    - at followup do cbc, bmet, mag, phos, lft and basic spirometry with dlco

## 2017-08-30 ENCOUNTER — Telehealth: Payer: Self-pay | Admitting: Internal Medicine

## 2017-08-30 NOTE — Telephone Encounter (Signed)
I saw patient 08/30/2017 In hallway. She told me started cellcept. Let her know I heard back from [employer] the pharmacist and per pharmacysit - Magnesiu, Vit E, D3, biotin , P82, Folic acid, allegra - all are fine with cellcept. Xarelto/Eliquis also ok  Also , thyroid med ok wih bactrim  Regarding acid reflux  - all meds can lower the effect of cellcept . But if is a problem as we go along we can change to another named brand called Myfortic. For now, I recommend patient try zantac   Dr. Brand Males, M.D., [facility].C.P Pulmonary and Critical Care Medicine Staff Physician, [place] Director - Interstitial Lung Disease  Program  Pulmonary [place] at [place], [place], [address]  Pager: [phone], If no answer or between  15:00h - 7:00h: call [phone] Telephone: [phone]

## 2017-09-04 NOTE — Telephone Encounter (Signed)
Spoke with patient. She is aware of MR's recs. She stated that she will try to stick with taking the Zantac, she hasn't been on it for long and perhaps her body needs more time to get used to it. Advised patient to call us back if she wasn't feeling any better. She verbalized understanding.   Nothing else needed at time of call.

## 2017-09-04 NOTE — Telephone Encounter (Signed)
Pt is calling back [phone]  Until 2:15  Then just call her cell     [phone]

## 2017-09-08 ENCOUNTER — Telehealth: Payer: Self-pay | Admitting: Internal Medicine

## 2017-09-08 NOTE — Telephone Encounter (Signed)
Restart omeprazole. AT followup, I will also d/w rheumatologist Dr Bronson Curb. According to the pharmacist the omeprazole makes the cellcept a bit weaker . This is ok for now because we are still making sure she is adjusting to the drug well.

## 2017-09-08 NOTE — Telephone Encounter (Signed)
Called and spoke wit patient, she states that since she has been taking the Cellcept she cannot take the Omeprazole for her acid reflux. She has tried many home remedies, tums, and Zantac but nothing is helping and her acid reflux is getting worse. Patient wants to know if there is something else she can take while also taking the Cellcept or if not then she will have to stop the Cellcept due to her acid reflux being so bad.   MR please advise on this, thanks.

## 2017-09-08 NOTE — Telephone Encounter (Signed)
Spoke with pt's husband, Legrand Como. He is aware of MR's respone. Legrand Como will relay this message to pt and have her call if she has any questions. Nothing further was needed at this time.

## 2017-09-11 ENCOUNTER — Encounter: Payer: Self-pay | Admitting: Oncology

## 2017-09-11 ENCOUNTER — Other Ambulatory Visit: Payer: Self-pay

## 2017-09-11 ENCOUNTER — Ambulatory Visit (INDEPENDENT_AMBULATORY_CARE_PROVIDER_SITE_OTHER): Payer: Medicare Other | Admitting: Oncology

## 2017-09-11 DIAGNOSIS — Z87891 Personal history of nicotine dependence: Secondary | ICD-10-CM

## 2017-09-11 DIAGNOSIS — J8489 Other specified interstitial pulmonary diseases: Secondary | ICD-10-CM

## 2017-09-11 DIAGNOSIS — Z832 Family history of diseases of the blood and blood-forming organs and certain disorders involving the immune mechanism: Secondary | ICD-10-CM

## 2017-09-11 DIAGNOSIS — E039 Hypothyroidism, unspecified: Secondary | ICD-10-CM | POA: Diagnosis not present

## 2017-09-11 DIAGNOSIS — J432 Centrilobular emphysema: Secondary | ICD-10-CM | POA: Diagnosis not present

## 2017-09-11 DIAGNOSIS — Z79890 Hormone replacement therapy (postmenopausal): Secondary | ICD-10-CM

## 2017-09-11 DIAGNOSIS — D510 Vitamin B12 deficiency anemia due to intrinsic factor deficiency: Secondary | ICD-10-CM

## 2017-09-11 DIAGNOSIS — Z7952 Long term (current) use of systemic steroids: Secondary | ICD-10-CM | POA: Diagnosis not present

## 2017-09-11 LAB — COMPREHENSIVE METABOLIC PANEL
ALBUMIN: 4 g/dL (ref 3.5–5.0)
ALK PHOS: 57 U/L (ref 38–126)
ALT: 17 U/L (ref 14–54)
AST: 19 U/L (ref 15–41)
Anion gap: 10 (ref 5–15)
BUN: 10 mg/dL (ref 6–20)
CO2: 24 mmol/L (ref 22–32)
CREATININE: 0.79 mg/dL (ref 0.44–1.00)
Calcium: 9 mg/dL (ref 8.9–10.3)
Chloride: 103 mmol/L (ref 101–111)
GFR calc Af Amer: >60 mL/min (ref >60)
GFR calc non Af Amer: >60 mL/min (ref >60)
GLUCOSE: 112 mg/dL — AB (ref 65–99)
Potassium: 4.4 mmol/L (ref 3.5–5.1)
SODIUM: 137 mmol/L (ref 135–145)
Total Bilirubin: 0.6 mg/dL (ref 0.3–1.2)
Total Protein: 7.2 g/dL (ref 6.5–8.1)

## 2017-09-11 LAB — CBC WITH DIFFERENTIAL/PLATELET
BASOS ABS: 0.1 10*3/uL (ref 0.0–0.1)
BASOS PCT: 1 %
Eosinophils Absolute: 0 10*3/uL (ref 0.0–0.7)
Eosinophils Relative: 0 %
HEMATOCRIT: 38.5 % (ref 36.0–46.0)
HEMOGLOBIN: 12.4 g/dL (ref 12.0–15.0)
Lymphocytes Relative: 8 %
Lymphs Abs: 0.7 10*3/uL (ref 0.7–4.0)
MCH: 32 pg (ref 26.0–34.0)
MCHC: 32.2 g/dL (ref 30.0–36.0)
MCV: 99.2 fL (ref 78.0–100.0)
MONOS PCT: 5 %
Monocytes Absolute: 0.4 10*3/uL (ref 0.1–1.0)
NEUTROS ABS: 6.7 10*3/uL (ref 1.7–7.7)
NEUTROS PCT: 86 %
Platelets: 269 10*3/uL (ref 150–400)
RBC: 3.88 MIL/uL (ref 3.87–5.11)
RDW: 14.1 % (ref 11.5–15.5)
WBC: 7.8 10*3/uL (ref 4.0–10.5)

## 2017-09-11 LAB — FERRITIN: Ferritin: 9 ng/mL — ABNORMAL LOW (ref 11–307)

## 2017-09-11 NOTE — Addendum Note (Signed)
Addended by: Truddie Crumble on: 09/11/2017 02:11 PM   Modules accepted: Orders

## 2017-09-11 NOTE — Progress Notes (Signed)
Hematology and Oncology Follow Up Visit  [name] [id number] Mar 15, 1941 77 y.o. 09/11/2017 4:39 PM   Principle Diagnosis: Encounter Diagnosis  Name Primary?  . Hereditary hemochromatosis ([place]) Yes  Clinical summary: 77 year old woman who is a homozygote for the C282Y hemachromatosis gene. She has an interesting family history since the gene is dominant but only variably expressed in different family members. One of her brothers had clinical hemachromatosis and developed end-stage liver disease requiring transplant and ultimately died of complications of his disease. Another brother is affected and on a phlebotomy program. A third brother is a heterozygote. All 3 of her daughters are affected. All are homozygotes. 2 of3 have not required phlebotomies despite homozygous status. [name] has only required a rare phlebotomy since her ferritin levels run low. She initially required phlebotomy December 2010 at which time the ferritin was 248. She was started on monthly phlebotomy program. Ferritin was down to 58 by 02/01/2010. Interval between phlebotomies was decreased every 3 months. In October 2012 the ferritin was down to 10. She was last phlebotomized in January 2013. Ferritin consistently low since that time and she has not required subsequent phlebotomy. She has a remote diagnosis of pernicious anemia and is still getting monthly B12 injections.  Her ferritin levels remain extremely low and she has not required phlebotomy except on rare occasions.  Interim History: With respect to her hemochromatosis, ferritin level have remained stable and low.  Value today is 9. She was evaluated for a persistent cough.  She is a former smoker who stopped 20 years ago.  She saw Dr. Chase Caller, pulmonary medicine.  Serology positive for anti-Sjogren antibodies.  She was also evaluated by rheumatology Dr. Bo Merino. She is now felt to have a component of idiopathic pulmonary fibrosis.  Most  recent pulmonary function test done June 12, 2017 with only minimal obstructive disease.  However DLCO reduced 50% of normal.  High resolution CT scan of the chest done July 03, 2017 shows changes consistent with moderate centrilobular emphysema as well as subpleural fibrosis at the lung bases mildly progressed compared with August 2017 study consistent with usual interstitial pneumonitis. About 2 weeks ago she was put on a trial of CellCept and low-dose prednisone.  Medications: reviewed  Allergies: No Known Allergies  Review of Systems: See interim history Remaining ROS negative:   Physical Exam: Blood pressure (!) 112/45, pulse 60, temperature 97.6 F (36.4 C), temperature source Oral, height 5\' 4"  (1.626 m), weight 145 lb 9.6 oz (66 kg), SpO2 96 %. Wt Readings from Last 3 Encounters:  09/11/17 145 lb 9.6 oz (66 kg)  08/29/17 149 lb (67.6 kg)  07/26/17 147 lb (66.7 kg)     General appearance: Thin but adequately nourished Caucasian woman HENNT: Pharynx no erythema, exudate, mass, or ulcer. No thyromegaly or thyroid nodules Lymph nodes: No cervical, supraclavicular, or axillary lymphadenopathy Breasts: No abnormal skin changes, no dominant mass in either breast Lungs: Clear to auscultation, resonant to percussion throughout Heart: Regular rhythm, no murmur, no gallop, no rub, no click, no edema Abdomen: Soft, nontender, normal bowel sounds, no mass, no organomegaly Extremities: No edema, no calf tenderness Musculoskeletal: no joint deformities GU:  Vascular: Carotid pulses 2+, no bruits, distal pulses: Dorsalis pedis 1+ symmetric Neurologic: Alert, oriented, PERRLA,   cranial nerves grossly normal, motor strength 5 over 5, reflexes 1+ symmetric, upper body coordination normal, gait normal, Skin: No rash or ecchymosis.  Prominent palmar erythema.  Lab Results: CBC W/Diff    Component  Value Date/Time   WBC 7.8 09/11/2017 1412   RBC 3.88 09/11/2017 1412   HGB 12.4  09/11/2017 1412   HGB 12.4 10/10/2016 0839   HGB 12.3 09/17/2012 1136   HCT 38.5 09/11/2017 1412   HCT 37.6 10/10/2016 0839   HCT 36.3 09/17/2012 1136   PLT 269 09/11/2017 1412   PLT 244 10/10/2016 0839   MCV 99.2 09/11/2017 1412   MCV 98 (H) 10/10/2016 0839   MCV 97.2 09/17/2012 1136   MCH 32.0 09/11/2017 1412   MCHC 32.2 09/11/2017 1412   RDW 14.1 09/11/2017 1412   RDW 14.9 10/10/2016 0839   RDW 15.0 (H) 09/17/2012 1136   LYMPHSABS 0.7 09/11/2017 1412   LYMPHSABS 1.3 10/10/2016 0839   LYMPHSABS 1.5 09/17/2012 1136   MONOABS 0.4 09/11/2017 1412   MONOABS 0.4 09/17/2012 1136   EOSABS 0.0 09/11/2017 1412   EOSABS 0.4 10/10/2016 0839   BASOSABS 0.1 09/11/2017 1412   BASOSABS 0.1 10/10/2016 0839   BASOSABS 0.1 09/17/2012 1136     Chemistry      Component Value Date/Time   NA 137 09/11/2017 1412   NA 141 10/10/2016 0839   NA 140 09/17/2012 1136   K 4.4 09/11/2017 1412   K 3.8 09/17/2012 1136   CL 103 09/11/2017 1412   CL 105 09/17/2012 1136   CO2 24 09/11/2017 1412   CO2 26 09/17/2012 1136   BUN 10 09/11/2017 1412   BUN 11 10/10/2016 0839   BUN 14.3 09/17/2012 1136   CREATININE 0.79 09/11/2017 1412   CREATININE 0.68 06/22/2017 1104   CREATININE 0.7 09/17/2012 1136      Component Value Date/Time   CALCIUM 9.0 09/11/2017 1412   CALCIUM 9.1 09/17/2012 1136   ALKPHOS 57 09/11/2017 1412   ALKPHOS 61 09/17/2012 1136   AST 19 09/11/2017 1412   AST 23 09/17/2012 1136   ALT 17 09/11/2017 1412   ALT 21 09/17/2012 1136   BILITOT 0.6 09/11/2017 1412   BILITOT 0.4 10/10/2016 0839   BILITOT 0.37 09/17/2012 1136       Radiological Studies: No results found.  Impression:  1.  Homozygous carrier for [id number] hemochromatosis gene. Low genetic penetrance with consistently low ferritin and minimal phlebotomy requirements-last phlebotomy October 2012.  2.  New diagnosis pulmonary fibrosis recently started on low-dose prednisone plus CellCept March 2019.  Prophylactic  Septra.  3.  Pernicious anemia on monthly B12 replacement  4.  Hypothyroid on replacement  5.   Remote history of paroxysmal atrial fibrillation.  Most recent cardiograms from July 2018 and January 2019 showing sinus bradycardia.  Currently on flecainide and Toprol-XL.  6.  Mild emphysema on Spiriva  CC: Patient Care Team: Chesley Noon, MD as PCP - General (Family Medicine) Annia Belt, MD as Consulting Physician (Hematology) Deboraha Sprang, MD as Consulting Physician (Cardiology)   Murriel Hopper, MD, [place]  Hematology-Oncology/Internal Medicine     3/18/20194:39 PM

## 2017-09-11 NOTE — Patient Instructions (Signed)
Lab in 6 months MD visit 1-2 weeks after lab 

## 2017-09-19 ENCOUNTER — Encounter: Payer: Self-pay | Admitting: Internal Medicine

## 2017-09-19 ENCOUNTER — Ambulatory Visit (INDEPENDENT_AMBULATORY_CARE_PROVIDER_SITE_OTHER): Payer: Medicare Other | Admitting: Internal Medicine

## 2017-09-19 ENCOUNTER — Other Ambulatory Visit (INDEPENDENT_AMBULATORY_CARE_PROVIDER_SITE_OTHER): Payer: Medicare Other

## 2017-09-19 VITALS — BP 108/64 | HR 62 | Ht 63.75 in | Wt 145.0 lb

## 2017-09-19 DIAGNOSIS — R768 Other specified abnormal immunological findings in serum: Secondary | ICD-10-CM

## 2017-09-19 DIAGNOSIS — J849 Interstitial pulmonary disease, unspecified: Secondary | ICD-10-CM

## 2017-09-19 DIAGNOSIS — I7300 Raynaud's syndrome without gangrene: Secondary | ICD-10-CM | POA: Diagnosis not present

## 2017-09-19 DIAGNOSIS — J439 Emphysema, unspecified: Secondary | ICD-10-CM

## 2017-09-19 DIAGNOSIS — Z5181 Encounter for therapeutic drug level monitoring: Secondary | ICD-10-CM | POA: Diagnosis not present

## 2017-09-19 LAB — BASIC METABOLIC PANEL
BUN: 13 mg/dL (ref 6–23)
CALCIUM: 9.6 mg/dL (ref 8.4–10.5)
CHLORIDE: 104 meq/L (ref 96–112)
CO2: 27 meq/L (ref 19–32)
Creatinine, Ser: 0.67 mg/dL (ref 0.40–1.20)
GFR: 90.91 mL/min (ref >60.00)
GLUCOSE: 110 mg/dL — AB (ref 70–99)
POTASSIUM: 4.6 meq/L (ref 3.5–5.1)
SODIUM: 135 meq/L (ref 135–145)

## 2017-09-19 LAB — HEPATIC FUNCTION PANEL
ALBUMIN: 4.2 g/dL (ref 3.5–5.2)
ALT: 12 U/L (ref 0–35)
AST: 14 U/L (ref 0–37)
Alkaline Phosphatase: 48 U/L (ref 39–117)
BILIRUBIN TOTAL: 0.5 mg/dL (ref 0.2–1.2)
Bilirubin, Direct: 0.1 mg/dL (ref 0.0–0.3)
Total Protein: 7.2 g/dL (ref 6.0–8.3)

## 2017-09-19 LAB — CBC WITH DIFFERENTIAL/PLATELET
BASOS ABS: 0.1 10*3/uL (ref 0.0–0.1)
Basophils Relative: 1.2 % (ref 0.0–3.0)
EOS PCT: 0.1 % (ref 0.0–5.0)
Eosinophils Absolute: 0 10*3/uL (ref 0.0–0.7)
HEMATOCRIT: 36.4 % (ref 36.0–46.0)
Hemoglobin: 12.2 g/dL (ref 12.0–15.0)
LYMPHS ABS: 1 10*3/uL (ref 0.7–4.0)
LYMPHS PCT: 16.4 % (ref 12.0–46.0)
MCHC: 33.4 g/dL (ref 30.0–36.0)
MCV: 98 fl (ref 78.0–100.0)
MONOS PCT: 5.2 % (ref 3.0–12.0)
Monocytes Absolute: 0.3 10*3/uL (ref 0.1–1.0)
NEUTROS ABS: 4.8 10*3/uL (ref 1.4–7.7)
Neutrophils Relative %: 77.1 % — ABNORMAL HIGH (ref 43.0–77.0)
PLATELETS: 234 10*3/uL (ref 150.0–400.0)
RBC: 3.72 Mil/uL — ABNORMAL LOW (ref 3.87–5.11)
RDW: 14.4 % (ref 11.5–15.5)
WBC: 6.2 10*3/uL (ref 4.0–10.5)

## 2017-09-19 LAB — PULMONARY FUNCTION TEST
DL/VA % PRED: 61 %
DL/VA: 2.93 ml/min/mmHg/L
DLCO COR % PRED: 59 %
DLCO COR: 14.29 ml/min/mmHg
DLCO unc % pred: 57 %
DLCO unc: 13.83 ml/min/mmHg
FEF 25-75 Pre: 1 L/sec
FEF2575-%Pred-Pre: 62 %
FEV1-%Pred-Pre: 98 %
FEV1-Pre: 2.02 L
FEV1FVC-%PRED-PRE: 88 %
FEV6-%Pred-Pre: 115 %
FEV6-PRE: 3 L
FEV6FVC-%Pred-Pre: 103 %
FVC-%Pred-Pre: 111 %
FVC-Pre: 3.04 L
PRE FEV6/FVC RATIO: 98 %
Pre FEV1/FVC ratio: 66 %

## 2017-09-19 MED ORDER — MYCOPHENOLATE SODIUM 360 MG PO TBEC: 360.0000 mg | DELAYED_RELEASE_TABLET | Freq: Two times a day (BID) | ORAL | 2 refills | Status: DC

## 2017-09-19 NOTE — Patient Instructions (Addendum)
ILD (interstitial lung disease) (HCC) Scl-70 antibody positive [name] phenomenon without gangrene Therapeutic drug monitoring  - ILD some better after starting cellcpet - However, omeprazole (that you are unable to be without) can make drug less effective  - So, let us change cellcept 500mg  twice daily to myfortic 360mg  twice daily - continue bactrim as before - continue daily prednisone - check cbc, bmet, lft 09/19/2017   Pulmonary emphysema, unspecified emphysema type ([place]) - continue spiriva daily   Followup 6-8 weeks to see how transition to myfortic has taken place

## 2017-09-19 NOTE — Progress Notes (Signed)
Subjective:     Patient ID: [name], female   DOB: 1941/01/04, 77 y.o.   MRN: [id number]  HPI      PCP [name],[name], PA-C   HPI  IOV 02/10/2016  Chief Complaint  Patient presents with  . Pulmonary Consult    Pt referred by Dr. Melford Aase for chronic cough x 1 year. Pt states she feels she has a tickle that is causing her dry cough. Pt states she has DOE when climbing stairs. Pt deneis CP/tightness.     77 year old female with hemochromatosis homozygous gene followed by Dr. Beryle Beams (last phlebotomy many years ago and is on serial monitoring) with children and siblings with active disease. She has also atrial fibrillation followed by Dr. Caryl Comes. Reports insidious onset of chronic cough in the last 1 year. It is stable since onset. It fluctuates between mild and severe in severity. It is mostly dry in quality. Mostly present in the daytime but sometimes also wakes her up at night. It is definitely not progressive. It is episodic and present every day. Aggravated by talking sometimes and then the throat feels dry associated with tickle in the throat and also sensation the cough is coming from the upper chest retrosternally and sometimes relieved by drinking water or chewing on a lozenge. Also aggravated by seasonal changes particularly in the spring and the fall which makes her think she has allergies. There is sometimes associated gag. She also reports nonspecific occasional wheezing and associated shortness of breath that is nonspecific but present with exertion and relieved by rest No other clear cut aggravating or relieving factors.   cough associated history  - Medications: She is not on fish oil or ACE inhibitors - Sinus drainage: She does admit to spring allergies. She did have something removed from her hard palate several years ago by ENT does not know details - Acid reflux: She says she is significant acid reflux and is on Prilosec. Without which she'll have significant  symptoms - Pulmonary disease: FenO 12 02/10/2016  and normal. She denies any personal history of asthma or pulmonary fibrosis of COPD or emphysema smoked one pack per day started smoking at age 16 and quit in 1999. Making a 42 pack smoking history. Chest x-ray 08/26/2014 personally visualized is clear - Tobacco :  reports that she quit smoking about 18 years ago. She has a 42.00 pack-year smoking history. She has never used smokeless tobacco.       OV 02/18/2016  Chief Complaint  Patient presents with  . Follow-up    Pt here after PFT and HRCT. Pt denies changes in SOB and cough. Pt denies any new complaints at this time.    Follow-up chronic cough. This visit is to follow-up on test results of function test and high resolution CT chest which are described below9 results show isolated reduction in diffusion capacity which can be explained by possible early ILD and also emphysema. There are also new findings of nodule 6 mm bilaterally on lower lobes. She prefers a very cautious approach to this evaluation.     Pulmonary function test 02/12/2016 - FVC 2.8 cm/101%, FEV1 1.9 L/91%. Ratio 60/90%. Total lung capacity 115%. DLCO reduced at 14.5/60%. She is isolated reduction in diffusion capacity    HRCT chest 02/16/16 IMPRESSION: 1. Mild patchy subpleural reticulation in both lungs with a basilar predominance. No significant traction bronchiectasis. No frank honeycombing. Findings could represent an interstitial lung disease such as nonspecific interstitial pneumonia (NSIP), with early usual interstitial  pneumonia (UIP) not excluded. A follow-up high-resolution chest CT study in 12 months is recommended to assess temporal pattern stability. 2. Bilateral lower lobe solid pulmonary nodules, largest 6 mm. Non-contrast chest CT at 3-6 months is recommended. If the nodules are stable at time of repeat CT, then future CT at 18-24 months (from today's scan) is considered optional for low-risk  patients, but is recommended for high-risk patients. This recommendation follows the consensus statement: Guidelines for Management of Incidental Pulmonary Nodules Detected on CT Images:From the Fleischner Society 2017; published online before print (10.1148/radiol.1610960454). 3. Additional findings include mild-to-moderate centrilobular emphysema, aortic atherosclerosis and 2 vessel coronary atherosclerosis.   Electronically Signed   By: Ilona Sorrel M.D.   On: 02/16/2016 14:14   OV 08/31/2016  Chief Complaint  Patient presents with  . Follow-up    HRCT was never scheduled. Pt states that the cough is still present >> slightly improved since last OV. Pt states that she feels her allergies has gotten it ramped up again.    Follow-up multifactorial cough associated with autoimmune antibody positive, interstitial lung disease and emphysema previous history of smoking  After last visit in August 2017 she had high resolution CT chest in December 2017 that showed persistence of ILD. I personally visualized the CT chest. He comes for follow-up. She tells me that since August 2017 she has insidious onset of shortness of breath and is progressively worse. It is mild and present only on inclines is not therefore tenderness. In terms of her cough is persistent. It is associated with significant sinus drainage that she thinks is allergy related. She constantly clears her throat. Lab review shows autoimmune antibody positive in August 2017 with scleroderma antibody slightly elevated at 6.4. I referred her to rheumatology but she does not remember this and she did not make this follow-up. She also tells me that she definitely has a long history of [name] phenomena in her fingers but no one has ever formally diagnosed with connective tissu  e disease.  OV 11/28/2016  Chief Complaint  Patient presents with  . Follow-up    Pt here after PFT. Pt states her breathing is uchanged since last OV. Pt  c/o dry cough.- pt states this has slightly improved since last OV. Pt denies CP/tightness and f/c/s.     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 Since then she has seen rheumatologis Dr. Keturah Barre. I reviewd the notes and also discussed with the patient wa a good understading of what s going on. She tells me that some of the lupus antibodies have been positive correlating with Raynaud. Patient is on beta blocker for atrial fbrillation and it is the preference by the rheumatologist Dr. D that she switc to calcium channel blocker. She has a follow-up appointment pending with her electrophysiologist Dr. Caryl Comes.  She is scleroderma antibody positive but according to her rheumtolgist she does not have dermatologic  Manifestation of the disese. 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 She pulm  function test today and this is stable. In terms of arrest or symptoms she stable. The cough is actually improved. But she does get dyspneic walking up stairs especially a few flights. She does not wnt her to pulmonary habilitation because se exrcises on the treadmill although she does not monitor her saturations.   reports that she quit smoking  about 19 years ago. She has a 42.00 pack-year smoking history. she has never used smokeless tobacco.   OV 06/12/2017  Chief Complaint  Patient presents with  . Follow-up    Pt states that she has been doing good since last visit. States that she has a "tickle cough" that is sporadic and has SOB that she states is when she climbs 2 flights of stairs. Denies any CP.    Follow-up combined emphysema was interstitial lung disease [autoimmune undifferentiated connective tissue disease interstitial lung disease]. Autoimmune features from May 2018 rheumatology noted with Dr. Keturah Barre: Raynaud phenomenon positive  without gangrene, positive anti-cardiolipin and positive IgM and SCL-70 positive without clinical features of scleroderma    Last seen June 2018. Since then she's stable. Overall she tells me that only problem is a tickle in her throat and slight cough. This is up and down depending on the pollen exposure she gets. She gets dyspneic for climbing few to several flights of stairs. This is unchanged. She did have full function test today and that shows mild significant worsening though overall gradient is only mild.. However she's not feeling this. She has appointment with Dr. Keturah Barre  pending    OV 07/18/2017  Chief Complaint  Patient presents with  . Follow-up    HRCT done 07/03/17.  Pt states she is the same as she was at last visit. SOB with exertion.    Follow-up combined emphysema was interstitial lung disease [autoimmune undifferentiated connective tissue disease interstitial lung disease]. Autoimmune features from May 2018 rheumatology noted with Dr. Keturah Barre: Raynaud phenomenon positive without gangrene, positive anti-cardiolipin and positive IgM and SCL-70 positive without clinical features of scleroderma    [name] presents for follow-up.  She is here to discuss the results of a high-resolution CT scan of the chest.  The scan was reviewed by Dr. Rosario Jacks thoracic radiology who feels that patient has probable UIP pattern that is definitely progressive compared to August 2017 although there is no comment about progression since June 2018 CT scan.  The pulmonary nodules itself are stable since August 2017 and are likely benign.  The change in the CT scan which is mild progression pulmonary fibrosis corresponds with the pulmonary function test that shows mild progression.  She is now here with her daughter Junie Panning and her husband.  She also states that 1 of her other daughters has rheumatoid arthritis and is on TNF alpha blockade.  All her 3 daughters have hemo-chromatosis    OV 08/29/2017  Chief  Complaint  Patient presents with  . Follow-up    Pt states she has been doing good since last visit. Pt recently went to [place] and had some mild problems with SOB due to heat.  Pt was prescribed cellcept and bactrim but has not yet started meds due to questions with other meds she is taking.    77 year old female with ILD secondary to autoimmune disease clinically suspicious of scleroderma but also previous history of hemochromatosis with family history of hemochromatosis.  She is here with her husband.  This visit is only a discussion visit.  Because she has many questions about starting CellCept before she actually does.  We did hepatitis virus panel, QuantiFERON gold, G6PD and all this is normal.  Lab work is normal.  I checked with [facility] pharmacist about interactions and was cleared for her to start CellCept.  The only recommendation was for patient to come off Prilosec because of reduced effect of CellCept.  Patient questions revolved around CellCept: She does not want to come off PPI because of hiatal hernia and has had bad acid reflux.  So I would not Willis Modena asking for any alternative PPI.  In addition she states she takes multiple multivitamins including Biotene, vitamin D3, vitamin D, vitamin Z61 and folic acid.  She also takes Allegra occasionally.  She wants to make sure it is all okay with CellCept.  She has not had a flu shot today and she is asking if she should have it.  She has never had flu shot before.  She is also wondering about anticoagulation in the setting of CellCept and thyroid issues in the setting of Bactrim.  In terms of her genetics: Initially I corresponded with our local geneticist who thought she might be better served at [facility] but the [facility] genetics person wrote to me saying that patient should first be seen by [facility] rheumatology and hematology.  Patient's not so sure she wants to see the subspecialist.  She will speak to Dr.  Beryle Beams her local hematologist to see if it is worthwhile seeing the hematology department at [facility].   Lungs/Pleura: Moderate centrilobular emphysema. Mild basilar predominant subpleural reticulation and ground-glass, increased from 02/16/2016. No traction bronchiectasis/ bronchiolectasis, architectural distortion or honeycombing. Side-by-side nodules in the medial left lower lobe measure up to 6 mm (series 3, image 95), unchanged from 02/16/2016 and considered benign. No air trapping. No pleural fluid. Airway is unremarkable.  IMPRESSION: 1. Mild progression in basilar predominant subpleural fibrosis from 02/16/2016, raising suspicion for usual interstitial pneumonitis. 2. Aortic atherosclerosis (ICD10-170.0). Moderate coronary artery calcification. 3.  Emphysema (ICD10-J43.9).   Electronically Signed   By: Lorin Picket M.D.   On: 07/03/2017 09:54    OV 09/19/2017  Chief Complaint  Patient presents with  . Follow-up    Pt states she has been doing good. Currently on cellcept and bactrim and has been doing well on it once put back on omeprazole. Pt still has the cough and sometimes when trying to take a deep breath can't fully get all O2 out.   Lezlie Ritchey returns for follow-up.  This is for autoimmune interstitial lung disease.  She was started on CellCept recently.  She is here to follow-up for therapeutic drug monitoring.  Because she was on omeprazole and we were initiating CellCept with Bactrim we asked her to hold off on omeprazole because the omeprazole impairs drug levels of CellCept.  However she tried it for a week and her acid reflux was significant despite ranitidine so she went back on omeprazole.  Pharmacist Willis Modena is advising changing the CellCept to equivaekbtdose of Myfortic. So far tolerating cellcept/pred/bactrimm well. Has seen dR G in heme and on observation. Had PFT today on cellcept/pred/bactrim and is better! Continue siwht  spriva    Results for RUVI, FULLENWIDER (MRN [id number]) as of 11/28/2016 12:08  Ref. Range 02/12/2016 12:57 11/28/2016 10:53 06/12/2017  09/19/2017 On cellcept/pred/.bactrim   FVC-Pre Latest Units: L 2.85 2.81 2.68 3.04  FVC-%Pred-Pre Latest Units: % 101 101 97 111    Results for JUDA, LAJEUNESSE (MRN [id number]) as of 11/28/2016 12:08  Ref. Range 02/12/2016 12:57 11/28/2016 10:53 06/12/2017  09/19/2017   DLCO cor Latest Units: ml/min/mmHg 14.54 13.36 12.17 14.29  DLCO cor % pred Latest Units: % 60 55 50 59       has a past medical history of Atrial fibrillation ([place]), Chest pain, Depression with anxiety, GERD (gastroesophageal reflux disease),  Hemochromatosis, History of syncope, and Hypothyroidism.   reports that she quit smoking about 20 years ago. She has a 42.00 pack-year smoking history. She has never used smokeless tobacco.  Past Surgical History:  Procedure Laterality Date  . MOUTH SURGERY    . SHOULDER ARTHROSCOPY Left   . TOTAL ABDOMINAL HYSTERECTOMY    . VARICOSE VEIN SURGERY      No Known Allergies  Immunization History  Administered Date(s) Administered  . Influenza, High Dose Seasonal PF 08/29/2017  . Pneumococcal Conjugate-13 06/12/2014  . Pneumococcal Polysaccharide-23 06/27/2010  . Tdap 03/16/2011  . Zoster 06/27/2010    Family History  Problem Relation Age of Onset  . Stroke Mother   . Heart disease Father 69  . Hemochromatosis Brother      Current Outpatient Medications:  .  Biotin 1000 MCG tablet, Take 1,000 mcg by mouth daily., Disp: , Rfl:  .  Calcium Carbonate 1500 MG TABS, Take 1 tablet by mouth 3 (three) times a week., Disp: , Rfl:  .  Cholecalciferol (VITAMIN D PO), Take 5,000 Units by mouth daily. , Disp: , Rfl:  .  flecainide (TAMBOCOR) 100 MG tablet, TAKE ONE-HALF TABLET BY  MOUTH IN THE MORNING AND  ONE-HALF TABLET IN THE  EVENING, Disp: 90 tablet, Rfl: 1 .  fluticasone (FLONASE) 50 MCG/ACT nasal spray, Place 2 sprays into both nostrils  daily., Disp: 16 g, Rfl: 5 .  folic acid (FOLVITE) 1 MG tablet, Take 1 mg by mouth daily.  , Disp: , Rfl:  .  levothyroxine (SYNTHROID, LEVOTHROID) 112 MCG tablet, Take 112 mcg by mouth daily.  , Disp: , Rfl:  .  methocarbamol (ROBAXIN) 500 MG tablet, TAKE 1 TABLET BY MOUTH AT  BEDTIME AS NEEDED FOR  MUSCLE SPASM(S), Disp: 30 tablet, Rfl: 0 .  methocarbamol (ROBAXIN) 750 MG tablet, Take 1 tablet (750 mg total) by mouth at bedtime as needed for muscle spasms., Disp: 30 tablet, Rfl: 2 .  metoprolol succinate (TOPROL-XL) 25 MG 24 hr tablet, TAKE 1 TABLET BY MOUTH  DAILY, Disp: 90 tablet, Rfl: 2 .  mycophenolate (CELLCEPT) 500 MG tablet, Take 1 tablet (500 mg total) by mouth 2 (two) times daily., Disp: 60 tablet, Rfl: 5 .  omeprazole (PRILOSEC) 40 MG capsule, Take 40 mg by mouth daily., Disp: , Rfl:  .  predniSONE (DELTASONE) 10 MG tablet, Take 1 tablet (10 mg total) by mouth daily with breakfast., Disp: 60 tablet, Rfl: 5 .  SPIRIVA RESPIMAT 2.5 MCG/ACT AERS, INHALE 2 PUFFS INTO THE LUNGS DAILY, Disp: 4 g, Rfl: 3 .  sulfamethoxazole-trimethoprim (BACTRIM DS,SEPTRA DS) 800-160 MG tablet, Take 1tablet Monday, 1tablet Wednesday, 1tablet Friday, Disp: 60 tablet, Rfl: 5 .  VITAMIN B1-B12 IM, Inject 1,000 mcg into the muscle every 30 (thirty) days. , Disp: , Rfl:  .  VITAMIN E PO, Take 1 tablet by mouth daily. , Disp: , Rfl:  .  XARELTO 20 MG TABS tablet, TAKE 1 TABLET BY MOUTH ONCE DAILY WITH FOOD, Disp: 90 tablet, Rfl: 1   Review of Systems     Objective:   Physical Exam  Constitutional: She is oriented to person, place, and time. She appears well-developed and well-nourished. No distress.  HENT:  Head: Normocephalic and atraumatic.  Right Ear: External ear normal.  Left Ear: External ear normal.  Mouth/Throat: Oropharynx is clear and moist. No oropharyngeal exudate.  Eyes: Pupils are equal, round, and reactive to light. Conjunctivae and EOM are normal. Right eye exhibits no discharge. Left eye  exhibits no discharge. No scleral icterus.  Neck: Normal range of motion. Neck supple. No JVD present. No tracheal deviation present. No thyromegaly present.  Cardiovascular: Normal rate, regular rhythm, normal heart sounds and intact distal pulses. Exam reveals no gallop and no friction rub.  No murmur heard. Pulmonary/Chest: Effort normal and breath sounds normal. No respiratory distress. She has no wheezes. She has no rales. She exhibits no tenderness.  Abdominal: Soft. Bowel sounds are normal. She exhibits no distension and no mass. There is no tenderness. There is no rebound and no guarding.  Musculoskeletal: Normal range of motion. She exhibits no edema or tenderness.  raynaud  Lymphadenopathy:    She has no cervical adenopathy.  Neurological: She is alert and oriented to person, place, and time. She has normal reflexes. No cranial nerve deficit. She exhibits normal muscle tone. Coordination normal.  Skin: Skin is warm and dry. No rash noted. She is not diaphoretic. No erythema. No pallor.  Psychiatric: She has a normal mood and affect. Her behavior is normal. Judgment and thought content normal.  Vitals reviewed.  Vitals:   09/19/17 1350  BP: 108/64  Pulse: 62  SpO2: 97%  Weight: 145 lb (65.8 kg)  Height: 5' 3.75" (1.619 m)    Estimated body mass index is 25.08 kg/m as calculated from the following:   Height as of this encounter: 5' 3.75" (1.619 m).   Weight as of this encounter: 145 lb (65.8 kg).     Assessment:       ICD-10-CM   1. ILD (interstitial lung disease) (HCC) J84.9 CBC w/Diff    Basic Metabolic Panel (BMET)    Hepatic function panel  2. Scl-70 antibody positive R76.8   3. [name] phenomenon without gangrene I73.00   4. Therapeutic drug monitoring Z51.81   5. Pulmonary emphysema, unspecified emphysema type ([place]) J43.9        Plan:     ILD (interstitial lung disease) (HCC) Scl-70 antibody positive [name] phenomenon without gangrene Therapeutic  drug monitoring  - ILD some better after starting cellcpet - However, omeprazole (that you are unable to be without) can make drug less effective  - So, let us change cellcept 500mg  twice daily to myfortic 360mg  twice daily - continue bactrim as before - continue daily prednisone - check cbc, bmet, lft 09/19/2017   Pulmonary emphysema, unspecified emphysema type ([place]) - continue spiriva daily   Followup 6-8 weeks to see how transition to    > 50% of this > 25 min visit spent in face to face counseling or coordination of care    Dr. Brand Males, M.D., [facility].C.P Pulmonary and Critical Care Medicine Staff Physician, [place] Director - Interstitial Lung Disease  Program  Pulmonary [place] at [place], [place], [address]  Pager: [phone], If no answer or between  15:00h - 7:00h: call [phone] Telephone: [phone]

## 2017-09-19 NOTE — Progress Notes (Signed)
PFT done today. 

## 2017-09-21 ENCOUNTER — Telehealth: Payer: Self-pay | Admitting: Internal Medicine

## 2017-09-21 NOTE — Telephone Encounter (Signed)
Yes   A. Myfortic dose equvialent is 360mg  for cellcept 500mg  B. Ok to start 09/22/17 - just simple switch . STOP CELLCEPT. START MYFORTIC  Dr. Brand Males, M.D., [facility].C.P Pulmonary and Critical Care Medicine Staff Physician, [place] Director - Interstitial Lung Disease  Program  Pulmonary [place] at [place], [place], [address]  Pager: [phone], If no answer or between  15:00h - 7:00h: call [phone] Telephone: [phone]

## 2017-09-21 NOTE — Telephone Encounter (Signed)
Called and spoke with patient she states that MR took her off 500 mg of cellcept and placed her on Mycophenolic 830 mg. Patient wanted MR to know that she will start it tomorrow since picking it up late today. Routing to MR for [employer]

## 2017-09-22 ENCOUNTER — Telehealth: Payer: Self-pay | Admitting: Internal Medicine

## 2017-09-22 MED ORDER — ONDANSETRON 4 MG PO TBDP: ORAL_TABLET | ORAL | 1 refills | Status: DC

## 2017-09-22 NOTE — Telephone Encounter (Signed)
Patient is aware. Nothing further needed.  

## 2017-09-22 NOTE — Telephone Encounter (Signed)
1. Did the patient switch to myfortic yet ? Or is this all cellcept?  2. PAtient did not have any side effects few days ago when I saw her and was low dose. So is it since then?   3. Was she taking it on empty stomach (1h before a meal or 2h after a meal)   4. For now  - hold cellcept  - do not start myfortic yet  - hold the bactrim too while holding cellcept  - DRink G2 gatorade in simple sips - Zofran sublingual 4-8 mg three times daily as needed; 7 day supply with 1 refill - any issues call over weekend through answering service. - Dr Nelda Marseille on call but he can call me     Dr. Brand Males, M.D., [facility].C.P Pulmonary and Critical Care Medicine Staff Physician, [place] Director - Interstitial Lung Disease  Program  Pulmonary [place] at [place], [place], [address]  Pager: [phone], If no answer or between  15:00h - 7:00h: call [phone] Telephone: [phone]

## 2017-09-22 NOTE — Telephone Encounter (Signed)
Spoke with Larene Beach. Larene Beach stated that her mother started the Myfortic this morning but was unable to keep it down. Larene Beach also stated that she has been having issues with her stomach since before her OV. As for the administration, she has been taking the Cellcept 1hr before a meal or 2hr after a meal.   She is aware to hold the 3 medications and that the Zofran will be called in. She appreciates MR responding so promptly.   Will route to MR so he is aware.

## 2017-09-22 NOTE — Telephone Encounter (Signed)
Spoke with patient's daughter Larene Beach. She stated that her mother has been taking Cellcept for the past few days and has had terrible diarrhea and vomiting spells ever since. She is not able to keep anything down at this point, including a 4mg  tablet of Zofran.   Larene Beach wants to know if MR could possible call in a RX for sublingual Zofran.   Pt uses [facility] on [address].   MR, please advise. Thanks!

## 2017-09-25 ENCOUNTER — Telehealth: Payer: Self-pay | Admitting: Internal Medicine

## 2017-09-25 NOTE — Telephone Encounter (Signed)
error 

## 2017-09-26 ENCOUNTER — Telehealth: Payer: Self-pay | Admitting: Internal Medicine

## 2017-09-26 NOTE — Telephone Encounter (Signed)
Called and spoke with patients daughter. She states that now that the patient is better she is wanting to know if she should start back on her medications. There are no symptoms of nausea or vomiting.   MR please advise, thanks.

## 2017-09-27 NOTE — Telephone Encounter (Signed)
Yes she can start medication back. However, please note most recent OV instruction was to switch from cellcept to myfortic and there is also dosing difference bdtween cellcept and myfortic. The myfortic is 360mg  twice daily. She has to restart bactrim 1DS Mon, Wed, Friday  as well and prednisone. All per prior OV instructions. Pleas mkake sure that      - - Oral dosage formulations (tablet, capsule, suspension) should be administered on an empty stomach (1 hour before or 2 hours after meals) to avoid variability in MPA absorption.  -> please ensure this is what pharmacy told her -

## 2017-09-27 NOTE — Telephone Encounter (Signed)
Spoke with patient. She is aware to take the Myfortic twice daily and when to take it. She is aware to restart the rest of her medications. Advised patient to call us back if she had any questions. She verbalized understanding.   Nothing else needed at time of call.

## 2017-09-28 ENCOUNTER — Other Ambulatory Visit: Payer: Self-pay | Admitting: Internal Medicine

## 2017-09-28 NOTE — Telephone Encounter (Signed)
Xarelto 20mg  refill request received; pt is 77 yrs old, wt-65.8kg, Crea-0.67 on 09/19/17, last seen by Tommye Standard on 07/26/17, CrCl-74.29ml/min; will send in refill to requested pharmacy.

## 2017-10-05 ENCOUNTER — Other Ambulatory Visit: Payer: Self-pay

## 2017-10-05 MED ORDER — METHOCARBAMOL 500 MG PO TABS: ORAL_TABLET | ORAL | 0 refills | Status: DC

## 2017-10-05 NOTE — Telephone Encounter (Signed)
ok 

## 2017-10-05 NOTE — Telephone Encounter (Signed)
Refill request received via fax.   Last visit: 06/22/2017 Next visit: 12/05/2017   Last fill: 07/03/2017  Okay to refill Robaxin?

## 2017-10-23 ENCOUNTER — Telehealth: Payer: Self-pay | Admitting: Internal Medicine

## 2017-10-23 NOTE — Telephone Encounter (Signed)
Per MR request sending this back to him. Patient is aware.

## 2017-10-23 NOTE — Telephone Encounter (Signed)
Can start with simple OTC melatonin 3mg  at night Avoid caffeeine after 2pm Avoid exercise last 3h before bed time

## 2017-10-23 NOTE — Telephone Encounter (Signed)
She's requesting a sleep aid to help with this, some type of melatonin possibly.

## 2017-10-23 NOTE — Telephone Encounter (Signed)
Called and spoke with patient, she states that ever since she started taking the medication myfortic she has had trouble sleeping. She says that she was told insomnia was a side effect of this medication. She is asking that we send her in something for her to get some sleep.   MR please advise, thank you.

## 2017-10-23 NOTE — Telephone Encounter (Signed)
Spoke with pt. States that she is taking Melatonin 10mg  nightly and this is not working.  Dr. Chase Caller - please advise. Thanks.

## 2017-10-23 NOTE — Telephone Encounter (Signed)
LEt patient know that I am going to discusss with a pharmacist and get back to her 10/24/17 Please send message back to me    Current Outpatient Medications:  .  Biotin 1000 MCG tablet, Take 1,000 mcg by mouth daily., Disp: , Rfl:  .  Calcium Carbonate 1500 MG TABS, Take 1 tablet by mouth 3 (three) times a week., Disp: , Rfl:  .  Cholecalciferol (VITAMIN D PO), Take 5,000 Units by mouth daily. , Disp: , Rfl:  .  flecainide (TAMBOCOR) 100 MG tablet, TAKE ONE-HALF TABLET BY  MOUTH IN THE MORNING AND  ONE-HALF TABLET IN THE  EVENING, Disp: 90 tablet, Rfl: 1 .  fluticasone (FLONASE) 50 MCG/ACT nasal spray, Place 2 sprays into both nostrils daily., Disp: 16 g, Rfl: 5 .  folic acid (FOLVITE) 1 MG tablet, Take 1 mg by mouth daily.  , Disp: , Rfl:  .  levothyroxine (SYNTHROID, LEVOTHROID) 112 MCG tablet, Take 112 mcg by mouth daily.  , Disp: , Rfl:  .  methocarbamol (ROBAXIN) 500 MG tablet, TAKE 1 TABLET BY MOUTH AT  BEDTIME AS NEEDED FOR  MUSCLE SPASM(S), Disp: 30 tablet, Rfl: 0 .  methocarbamol (ROBAXIN) 750 MG tablet, Take 1 tablet (750 mg total) by mouth at bedtime as needed for muscle spasms., Disp: 30 tablet, Rfl: 2 .  metoprolol succinate (TOPROL-XL) 25 MG 24 hr tablet, TAKE 1 TABLET BY MOUTH  DAILY, Disp: 90 tablet, Rfl: 2 .  mycophenolate (MYFORTIC) 360 MG TBEC EC tablet, Take 1 tablet (360 mg total) by mouth 2 (two) times daily., Disp: 120 tablet, Rfl: 2 .  omeprazole (PRILOSEC) 40 MG capsule, Take 40 mg by mouth daily., Disp: , Rfl:  .  ondansetron (ZOFRAN ODT) 4 MG disintegrating tablet, Take 1-2 tablets three times daily as needed, Disp: 42 tablet, Rfl: 1 .  predniSONE (DELTASONE) 10 MG tablet, Take 1 tablet (10 mg total) by mouth daily with breakfast., Disp: 60 tablet, Rfl: 5 .  SPIRIVA RESPIMAT 2.5 MCG/ACT AERS, INHALE 2 PUFFS INTO THE LUNGS DAILY, Disp: 4 g, Rfl: 3 .  sulfamethoxazole-trimethoprim (BACTRIM DS,SEPTRA DS) 800-160 MG tablet, Take 1tablet Monday, 1tablet Wednesday, 1tablet  Friday, Disp: 60 tablet, Rfl: 5 .  VITAMIN B1-B12 IM, Inject 1,000 mcg into the muscle every 30 (thirty) days. , Disp: , Rfl:  .  VITAMIN E PO, Take 1 tablet by mouth daily. , Disp: , Rfl:  .  XARELTO 20 MG TABS tablet, TAKE 1 TABLET BY MOUTH ONCE DAILY WITH FOOD, Disp: 90 tablet, Rfl: 2

## 2017-10-25 NOTE — Telephone Encounter (Signed)
MR, please advise if anything else is needed. Thanks!

## 2017-10-26 NOTE — Telephone Encounter (Signed)
Left message for patient to call back  

## 2017-10-26 NOTE — Telephone Encounter (Signed)
Please call patient back and see if insomnia is still there. Please let patient know that the pharmacist is doing some research and is recommending ellavil . I would need to run that by her cardiologist as well. Other thing is to take a small dose of restoril but that is a benzodiazepine (like xanax) and we are weighing the options so that we are not over medicating her but still giving her quality of life Please send note back to me

## 2017-10-27 NOTE — Telephone Encounter (Signed)
Heard back from [employer] pharmacist who stated either ellavil or restoril would be fine. However if insomnia getting better I would prefer a wait and watch approach before throwing another medicine into the mix

## 2017-10-27 NOTE — Telephone Encounter (Signed)
Spoke with pt, advised message from MR. She states her insomnia is getting better and she feels she can wait until Tuesday to discuss further. FYI MR

## 2017-10-31 ENCOUNTER — Ambulatory Visit (INDEPENDENT_AMBULATORY_CARE_PROVIDER_SITE_OTHER): Payer: Medicare Other | Admitting: Internal Medicine

## 2017-10-31 ENCOUNTER — Encounter: Payer: Self-pay | Admitting: Internal Medicine

## 2017-10-31 VITALS — BP 112/64 | HR 60 | Ht 63.75 in | Wt 145.2 lb

## 2017-10-31 DIAGNOSIS — J8410 Pulmonary fibrosis, unspecified: Secondary | ICD-10-CM

## 2017-10-31 DIAGNOSIS — J849 Interstitial pulmonary disease, unspecified: Secondary | ICD-10-CM | POA: Diagnosis not present

## 2017-10-31 DIAGNOSIS — Z5181 Encounter for therapeutic drug level monitoring: Secondary | ICD-10-CM

## 2017-10-31 DIAGNOSIS — J439 Emphysema, unspecified: Secondary | ICD-10-CM | POA: Diagnosis not present

## 2017-10-31 DIAGNOSIS — I7300 Raynaud's syndrome without gangrene: Secondary | ICD-10-CM | POA: Diagnosis not present

## 2017-10-31 DIAGNOSIS — R768 Other specified abnormal immunological findings in serum: Secondary | ICD-10-CM | POA: Diagnosis not present

## 2017-10-31 NOTE — Addendum Note (Signed)
Addended by: Vivia Ewing on: 10/31/2017 09:56 AM   Modules accepted: Orders

## 2017-10-31 NOTE — Patient Instructions (Addendum)
ILD (interstitial lung disease) (HCC) Scl-70 antibody positive [name] phenomenon without gangrene Therapeutic drug monitoring  - glad you are tolerating myfortic well with just some mild nausea that is occassional - glad insomnia resolved - CMA to list cellcept as allergy of "nausea and vomit" - continue myfortic at 360mg  twice daily + prednisone 10mg  daily + bactrim 1DS mon/wed/fri  - will not increase myfortic dose given efficachy at similar dose of cellcept and good tolerance - contine omepreazole - do cbc, bmet, lft  10/31/2017  - discussed ILD PRO registry study - please take consent copy   - if you meet criteria someone from [facility] should call you next 4-6 weeks   Pulmonary emphysema, unspecified emphysema type ([place]) - contnue spiriva daily  Followup - in 4 weeks do cbc, bmet, lft again - in 3 months do Pre-bd spiro and dlco only. No lung volume or bd response. No post-bd spiro - Return Dr Chase Caller ILD clinic in 60months

## 2017-10-31 NOTE — Progress Notes (Signed)
Subjective:     Patient ID: [name], female   DOB: 1941/02/27, 77 y.o.   MRN: [id number]  HPI   PCP [name],[name], PA-C   HPI  IOV 02/10/2016  Chief Complaint  Patient presents with  . Pulmonary Consult    Pt referred by Dr. Melford Aase for chronic cough x 1 year. Pt states she feels she has a tickle that is causing her dry cough. Pt states she has DOE when climbing stairs. Pt deneis CP/tightness.     77 year old female with hemochromatosis homozygous gene followed by Dr. Beryle Beams (last phlebotomy many years ago and is on serial monitoring) with children and siblings with active disease. She has also atrial fibrillation followed by Dr. Caryl Comes. Reports insidious onset of chronic cough in the last 1 year. It is stable since onset. It fluctuates between mild and severe in severity. It is mostly dry in quality. Mostly present in the daytime but sometimes also wakes her up at night. It is definitely not progressive. It is episodic and present every day. Aggravated by talking sometimes and then the throat feels dry associated with tickle in the throat and also sensation the cough is coming from the upper chest retrosternally and sometimes relieved by drinking water or chewing on a lozenge. Also aggravated by seasonal changes particularly in the spring and the fall which makes her think she has allergies. There is sometimes associated gag. She also reports nonspecific occasional wheezing and associated shortness of breath that is nonspecific but present with exertion and relieved by rest No other clear cut aggravating or relieving factors.   cough associated history  - Medications: She is not on fish oil or ACE inhibitors - Sinus drainage: She does admit to spring allergies. She did have something removed from her hard palate several years ago by ENT does not know details - Acid reflux: She says she is significant acid reflux and is on Prilosec. Without which she'll have significant  symptoms - Pulmonary disease: FenO 12 02/10/2016  and normal. She denies any personal history of asthma or pulmonary fibrosis of COPD or emphysema smoked one pack per day started smoking at age 78 and quit in 1999. Making a 42 pack smoking history. Chest x-ray 08/26/2014 personally visualized is clear - Tobacco :  reports that she quit smoking about 18 years ago. She has a 42.00 pack-year smoking history. She has never used smokeless tobacco.       OV 02/18/2016  Chief Complaint  Patient presents with  . Follow-up    Pt here after PFT and HRCT. Pt denies changes in SOB and cough. Pt denies any new complaints at this time.    Follow-up chronic cough. This visit is to follow-up on test results of function test and high resolution CT chest which are described below9 results show isolated reduction in diffusion capacity which can be explained by possible early ILD and also emphysema. There are also new findings of nodule 6 mm bilaterally on lower lobes. She prefers a very cautious approach to this evaluation.     Pulmonary function test 02/12/2016 - FVC 2.8 cm/101%, FEV1 1.9 L/91%. Ratio 60/90%. Total lung capacity 115%. DLCO reduced at 14.5/60%. She is isolated reduction in diffusion capacity    HRCT chest 02/16/16 IMPRESSION: 1. Mild patchy subpleural reticulation in both lungs with a basilar predominance. No significant traction bronchiectasis. No frank honeycombing. Findings could represent an interstitial lung disease such as nonspecific interstitial pneumonia (NSIP), with early usual interstitial pneumonia (UIP) not  excluded. A follow-up high-resolution chest CT study in 12 months is recommended to assess temporal pattern stability. 2. Bilateral lower lobe solid pulmonary nodules, largest 6 mm. Non-contrast chest CT at 3-6 months is recommended. If the nodules are stable at time of repeat CT, then future CT at 18-24 months (from today's scan) is considered optional for low-risk  patients, but is recommended for high-risk patients. This recommendation follows the consensus statement: Guidelines for Management of Incidental Pulmonary Nodules Detected on CT Images:From the Fleischner Society 2017; published online before print (10.1148/radiol.3614431540). 3. Additional findings include mild-to-moderate centrilobular emphysema, aortic atherosclerosis and 2 vessel coronary atherosclerosis.   Electronically Signed   By: Ilona Sorrel M.D.   On: 02/16/2016 14:14   OV 08/31/2016  Chief Complaint  Patient presents with  . Follow-up    HRCT was never scheduled. Pt states that the cough is still present >> slightly improved since last OV. Pt states that she feels her allergies has gotten it ramped up again.    Follow-up multifactorial cough associated with autoimmune antibody positive, interstitial lung disease and emphysema previous history of smoking  After last visit in August 2017 she had high resolution CT chest in December 2017 that showed persistence of ILD. I personally visualized the CT chest. He comes for follow-up. She tells me that since August 2017 she has insidious onset of shortness of breath and is progressively worse. It is mild and present only on inclines is not therefore tenderness. In terms of her cough is persistent. It is associated with significant sinus drainage that she thinks is allergy related. She constantly clears her throat. Lab review shows autoimmune antibody positive in August 2017 with scleroderma antibody slightly elevated at 6.4. I referred her to rheumatology but she does not remember this and she did not make this follow-up. She also tells me that she definitely has a long history of [name] phenomena in her fingers but no one has ever formally diagnosed with connective tissu  e disease.  OV 11/28/2016  Chief Complaint  Patient presents with  . Follow-up    Pt here after PFT. Pt states her breathing is uchanged since last OV. Pt  c/o dry cough.- pt states this has slightly improved since last OV. Pt denies CP/tightness and f/c/s.     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 Since then she has seen rheumatologis Dr. Keturah Barre. I reviewd the notes and also discussed with the patient wa a good understading of what s going on. She tells me that some of the lupus antibodies have been positive correlating with Raynaud. Patient is on beta blocker for atrial fbrillation and it is the preference by the rheumatologist Dr. D that she switc to calcium channel blocker. She has a follow-up appointment pending with her electrophysiologist Dr. Caryl Comes.  She is scleroderma antibody positive but according to her rheumtolgist she does not have dermatologic  Manifestation of the disese. 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 She pulm  function test today and this is stable. In terms of arrest or symptoms she stable. The cough is actually improved. But she does get dyspneic walking up stairs especially a few flights. She does not wnt her to pulmonary habilitation because se exrcises on the treadmill although she does not monitor her saturations.   reports that she quit smoking about 19 years  ago. She has a 42.00 pack-year smoking history. she has never used smokeless tobacco.   OV 06/12/2017  Chief Complaint  Patient presents with  . Follow-up    Pt states that she has been doing good since last visit. States that she has a "tickle cough" that is sporadic and has SOB that she states is when she climbs 2 flights of stairs. Denies any CP.    Follow-up combined emphysema was interstitial lung disease [autoimmune undifferentiated connective tissue disease interstitial lung disease]. Autoimmune features from May 2018 rheumatology noted with Dr. Keturah Barre: Raynaud phenomenon positive  without gangrene, positive anti-cardiolipin and positive IgM and SCL-70 positive without clinical features of scleroderma    Last seen June 2018. Since then she's stable. Overall she tells me that only problem is a tickle in her throat and slight cough. This is up and down depending on the pollen exposure she gets. She gets dyspneic for climbing few to several flights of stairs. This is unchanged. She did have full function test today and that shows mild significant worsening though overall gradient is only mild.. However she's not feeling this. She has appointment with Dr. Keturah Barre  pending    OV 07/18/2017  Chief Complaint  Patient presents with  . Follow-up    HRCT done 07/03/17.  Pt states she is the same as she was at last visit. SOB with exertion.    Follow-up combined emphysema was interstitial lung disease [autoimmune undifferentiated connective tissue disease interstitial lung disease]. Autoimmune features from May 2018 rheumatology noted with Dr. Keturah Barre: Raynaud phenomenon positive without gangrene, positive anti-cardiolipin and positive IgM and SCL-70 positive without clinical features of scleroderma    AKSHITHA CULMER presents for follow-up.  She is here to discuss the results of a high-resolution CT scan of the chest.  The scan was reviewed by Dr. Rosario Jacks thoracic radiology who feels that patient has probable UIP pattern that is definitely progressive compared to August 2017 although there is no comment about progression since June 2018 CT scan.  The pulmonary nodules itself are stable since August 2017 and are likely benign.  The change in the CT scan which is mild progression pulmonary fibrosis corresponds with the pulmonary function test that shows mild progression.  She is now here with her daughter Junie Panning and her husband.  She also states that 1 of her other daughters has rheumatoid arthritis and is on TNF alpha blockade.  All her 3 daughters have hemo-chromatosis    OV 08/29/2017  Chief  Complaint  Patient presents with  . Follow-up    Pt states she has been doing good since last visit. Pt recently went to [place] and had some mild problems with SOB due to heat.  Pt was prescribed cellcept and bactrim but has not yet started meds due to questions with other meds she is taking.    77 year old female with ILD secondary to autoimmune disease clinically suspicious of scleroderma but also previous history of hemochromatosis with family history of hemochromatosis.  She is here with her husband.  This visit is only a discussion visit.  Because she has many questions about starting CellCept before she actually does.  We did hepatitis virus panel, QuantiFERON gold, G6PD and all this is normal.  Lab work is normal.  I checked with [facility] pharmacist about interactions and was cleared for her to start CellCept.  The only recommendation was for patient to come off Prilosec because of reduced effect of CellCept.  Patient questions revolved  around CellCept: She does not want to come off PPI because of hiatal hernia and has had bad acid reflux.  So I would not Willis Modena asking for any alternative PPI.  In addition she states she takes multiple multivitamins including Biotene, vitamin D3, vitamin D, vitamin A21 and folic acid.  She also takes Allegra occasionally.  She wants to make sure it is all okay with CellCept.  She has not had a flu shot today and she is asking if she should have it.  She has never had flu shot before.  She is also wondering about anticoagulation in the setting of CellCept and thyroid issues in the setting of Bactrim.  In terms of her genetics: Initially I corresponded with our local geneticist who thought she might be better served at [facility] but the [facility] genetics person wrote to me saying that patient should first be seen by [facility] rheumatology and hematology.  Patient's not so sure she wants to see the subspecialist.  She will speak to Dr.  Beryle Beams her local hematologist to see if it is worthwhile seeing the hematology department at [facility].   Lungs/Pleura: Moderate centrilobular emphysema. Mild basilar predominant subpleural reticulation and ground-glass, increased from 02/16/2016. No traction bronchiectasis/ bronchiolectasis, architectural distortion or honeycombing. Side-by-side nodules in the medial left lower lobe measure up to 6 mm (series 3, image 95), unchanged from 02/16/2016 and considered benign. No air trapping. No pleural fluid. Airway is unremarkable.  IMPRESSION: 1. Mild progression in basilar predominant subpleural fibrosis from 02/16/2016, raising suspicion for usual interstitial pneumonitis. 2. Aortic atherosclerosis (ICD10-170.0). Moderate coronary artery calcification. 3.  Emphysema (ICD10-J43.9).   Electronically Signed   By: Lorin Picket M.D.   On: 07/03/2017 09:54    OV 09/19/2017  Chief Complaint  Patient presents with  . Follow-up    Pt states she has been doing good. Currently on cellcept and bactrim and has been doing well on it once put back on omeprazole. Pt still has the cough and sometimes when trying to take a deep breath can't fully get all O2 out.   Kiah Vanalstine returns for follow-up.  This is for autoimmune interstitial lung disease.  She was started on CellCept recently.  She is here to follow-up for therapeutic drug monitoring.  Because she was on omeprazole and we were initiating CellCept with Bactrim we asked her to hold off on omeprazole because the omeprazole impairs drug levels of CellCept.  However she tried it for a week and her acid reflux was significant despite ranitidine so she went back on omeprazole.  Pharmacist Willis Modena is advising changing the CellCept to equivaekbtdose of Myfortic. So far tolerating cellcept/pred/bactrimm well. Has seen dR G in heme and on observation. Had PFT today on cellcept/pred/bactrim and is better! Continue siwht  spriva  OV 10/31/2017  . Chief Complaint  Patient presents with  . Follow-up    Pt currently taking myfortic and states she has been well on that. Pt states she has some mild nausea and has some problems with sleeping at night. SOB is stable,mild coughing. Denies any CP.    Follow-up progressive interstitial lung disease with SCL-70 antibody positive and Raynard Follow-up associated emphysema.  Last visit September 19, 2017.  At that time she had intolerance to the CellCept therefore we switched her to Myfortic.  She is here to report for follow-up about this.  She is tolerating Myfortic just well.  She had some transient insomnia for a few days last week  but this resolved.  She only has mild intermittent occasional nausea with the Myfortic but otherwise is tolerating it really well at the low-dose of 360 mg twice daily.  She continues on prednisone 10 mg daily and Bactrim 3 times a week.  She is due for blood work today.  There are no other new issues.  She continues on Spiriva for her emphysema  Results for JOHNYE, KIST (MRN [id number]) as of 11/28/2016 12:08  Ref. Range 02/12/2016 12:57 11/28/2016 10:53 06/12/2017  09/19/2017 On cellcept/pred/.bactrim   FVC-Pre Latest Units: L 2.85 2.81 2.68 3.04  FVC-%Pred-Pre Latest Units: % 101 101 97 111    Results for PETA, PEACHEY (MRN [id number]) as of 11/28/2016 12:08  Ref. Range 02/12/2016 12:57 11/28/2016 10:53 06/12/2017  09/19/2017   DLCO cor Latest Units: ml/min/mmHg 14.54 13.36 12.17 14.29  DLCO cor % pred Latest Units: % 60 55 50 59   Simple office walk 185 feet x  3 laps goal with forehead probe 10/31/2017   O2 used Room air  Number laps completed 3  Comments about pace Normal pace  Resting Pulse Ox/HR 98% and 63/min  Final Pulse Ox/HR 99% and 80/min  Desaturated </= 88% no  Desaturated <= 3% points no  Got Tachycardic >/= 90/min no  Symptoms at end of test none  Miscellaneous comments none       has a past medical history of  Atrial fibrillation ([place]), Chest pain, Depression with anxiety, GERD (gastroesophageal reflux disease), Hemochromatosis, History of syncope, and Hypothyroidism.   reports that she quit smoking about 20 years ago. She has a 42.00 pack-year smoking history. She has never used smokeless tobacco.  Past Surgical History:  Procedure Laterality Date  . MOUTH SURGERY    . SHOULDER ARTHROSCOPY Left   . TOTAL ABDOMINAL HYSTERECTOMY    . VARICOSE VEIN SURGERY      No Known Allergies  Immunization History  Administered Date(s) Administered  . Influenza, High Dose Seasonal PF 08/29/2017  . Pneumococcal Conjugate-13 06/12/2014  . Pneumococcal Polysaccharide-23 06/27/2010  . Tdap 03/16/2011  . Zoster 06/27/2010    Family History  Problem Relation Age of Onset  . Stroke Mother   . Heart disease Father 58  . Hemochromatosis Brother      Current Outpatient Medications:  .  Biotin 1000 MCG tablet, Take 1,000 mcg by mouth daily., Disp: , Rfl:  .  Calcium Carbonate 1500 MG TABS, Take 1 tablet by mouth 3 (three) times a week., Disp: , Rfl:  .  Cholecalciferol (VITAMIN D PO), Take 5,000 Units by mouth daily. , Disp: , Rfl:  .  flecainide (TAMBOCOR) 100 MG tablet, TAKE ONE-HALF TABLET BY  MOUTH IN THE MORNING AND  ONE-HALF TABLET IN THE  EVENING, Disp: 90 tablet, Rfl: 1 .  fluticasone (FLONASE) 50 MCG/ACT nasal spray, Place 2 sprays into both nostrils daily., Disp: 16 g, Rfl: 5 .  folic acid (FOLVITE) 1 MG tablet, Take 1 mg by mouth daily.  , Disp: , Rfl:  .  levothyroxine (SYNTHROID, LEVOTHROID) 112 MCG tablet, Take 112 mcg by mouth daily.  , Disp: , Rfl:  .  methocarbamol (ROBAXIN) 500 MG tablet, TAKE 1 TABLET BY MOUTH AT  BEDTIME AS NEEDED FOR  MUSCLE SPASM(S), Disp: 30 tablet, Rfl: 0 .  methocarbamol (ROBAXIN) 750 MG tablet, Take 1 tablet (750 mg total) by mouth at bedtime as needed for muscle spasms., Disp: 30 tablet, Rfl: 2 .  metoprolol succinate (TOPROL-XL) 25 MG 24 hr tablet,  TAKE 1 TABLET  BY MOUTH  DAILY, Disp: 90 tablet, Rfl: 2 .  mycophenolate (MYFORTIC) 360 MG TBEC EC tablet, Take 1 tablet (360 mg total) by mouth 2 (two) times daily., Disp: 120 tablet, Rfl: 2 .  omeprazole (PRILOSEC) 40 MG capsule, Take 40 mg by mouth daily., Disp: , Rfl:  .  ondansetron (ZOFRAN ODT) 4 MG disintegrating tablet, Take 1-2 tablets three times daily as needed, Disp: 42 tablet, Rfl: 1 .  predniSONE (DELTASONE) 10 MG tablet, Take 1 tablet (10 mg total) by mouth daily with breakfast., Disp: 60 tablet, Rfl: 5 .  SPIRIVA RESPIMAT 2.5 MCG/ACT AERS, INHALE 2 PUFFS INTO THE LUNGS DAILY, Disp: 4 g, Rfl: 3 .  sulfamethoxazole-trimethoprim (BACTRIM DS,SEPTRA DS) 800-160 MG tablet, Take 1tablet Monday, 1tablet Wednesday, 1tablet Friday, Disp: 60 tablet, Rfl: 5 .  VITAMIN B1-B12 IM, Inject 1,000 mcg into the muscle every 30 (thirty) days. , Disp: , Rfl:  .  VITAMIN E PO, Take 1 tablet by mouth daily. , Disp: , Rfl:  .  XARELTO 20 MG TABS tablet, TAKE 1 TABLET BY MOUTH ONCE DAILY WITH FOOD, Disp: 90 tablet, Rfl: 2    Review of Systems     Objective:   Physical Exam  Constitutional: She is oriented to person, place, and time. She appears well-developed and well-nourished. No distress.  HENT:  Head: Normocephalic and atraumatic.  Right Ear: External ear normal.  Left Ear: External ear normal.  Mouth/Throat: Oropharynx is clear and moist. No oropharyngeal exudate.  Eyes: Pupils are equal, round, and reactive to light. Conjunctivae and EOM are normal. Right eye exhibits no discharge. Left eye exhibits no discharge. No scleral icterus.  Neck: Normal range of motion. Neck supple. No JVD present. No tracheal deviation present. No thyromegaly present.  Cardiovascular: Normal rate, regular rhythm, normal heart sounds and intact distal pulses. Exam reveals no gallop and no friction rub.  No murmur heard. Pulmonary/Chest: Effort normal. No respiratory distress. She has no wheezes. She has rales. She exhibits no  tenderness.  Very faint crackles at lung base  Abdominal: Soft. Bowel sounds are normal. She exhibits no distension and no mass. There is no tenderness. There is no rebound and no guarding.  Musculoskeletal: Normal range of motion. She exhibits no edema or tenderness.  Lymphadenopathy:    She has no cervical adenopathy.  Neurological: She is alert and oriented to person, place, and time. She has normal reflexes. No cranial nerve deficit. She exhibits normal muscle tone. Coordination normal.  Skin: Skin is warm and dry. No rash noted. She is not diaphoretic. No erythema. No pallor.  Psychiatric: She has a normal mood and affect. Her behavior is normal. Judgment and thought content normal.  Vitals reviewed.  Vitals:   10/31/17 0912  BP: 112/64  Pulse: 60  SpO2: 99%  Weight: 145 lb 3.2 oz (65.9 kg)  Height: 5' 3.75" (1.619 m)   Estimated body mass index is 25.12 kg/m as calculated from the following:   Height as of this encounter: 5' 3.75" (1.619 m).   Weight as of this encounter: 145 lb 3.2 oz (65.9 kg).      Assessment:       ICD-10-CM   1. ILD (interstitial lung disease) (HCC) J84.9 CBC w/Diff    Basic Metabolic Panel (BMET)    Hepatic function panel  2. Scl-70 antibody positive R76.8   3. [name] phenomenon without gangrene I73.00   4. Therapeutic drug monitoring Z51.81 CBC w/Diff    Basic Metabolic  Panel (BMET)    Hepatic function panel  5. Pulmonary emphysema with fibrosis of lung ([place]) J43.9    J84.10        Plan:     ILD (interstitial lung disease) (HCC) Scl-70 antibody positive [name] phenomenon without gangrene Therapeutic drug monitoring  - glad you are tolerating myfortic well with just some mild nausea that is occassional - glad insomnia resolved - CMA to list cellcept as allergy of "nausea and vomit" - continue myfortic at 360mg  twice daily + prednisone 10mg  daily + bactrim 1DS mon/wed/fri  - will not increase myfortic dose given efficachy at  similar dose of cellcept and good tolerance - contine omepreazole - do cbc, bmet, lft  10/31/2017  - discussed ILD PRO registry study - please take consent copy   - if you meet criteria someone from [facility] should call you next 4-6 weeks   Pulmonary emphysema, unspecified emphysema type ([place]) - contnue spiriva daily  Followup - in 4 weeks do cbc, bmet, lft again - in 3 months do Pre-bd spiro and dlco only. No lung volume or bd response. No post-bd spiro - Return Dr Chase Caller ILD clinic in 51months   Dr. Brand Males, M.D., [facility].C.P Pulmonary and Critical Care Medicine Staff Physician, [place] Director - Interstitial Lung Disease  Program  Pulmonary [place] [facility] [place], [place], [address]  Pager: [phone], If no answer or between  15:00h - 7:00h: call [phone] Telephone: [phone]

## 2017-11-23 NOTE — Progress Notes (Signed)
Office Visit Note  Patient: [name]             Date of Birth: 1941/01/15           MRN: [id number]             PCP: Chesley Noon, MD Referring: Chesley Noon, MD Visit Date: 12/05/2017 Occupation: @GUAROCC @    Subjective:  Raynauds and osteoarthritis.   History of Present Illness: [name] is a 77 y.o. female with history of Raynauds and interstitial lung disease.  She has been followed by Dr. Chase Caller for interstitial lung disease.  She switched from CellCept to Myfortic per his recommendation.  She is still on prednisone 5 mg p.o. daily.  She continues to have some Raynauds phenomenon.  Lower back pain and hands are doing well.  Her right trochanteric bursitis is improved after the physical therapy.  Activities of Daily Living:  Patient reports morning stiffness for 10- 15  minutes.   Patient Reports nocturnal pain.  Difficulty dressing/grooming: Denies Difficulty climbing stairs: Denies Difficulty getting out of chair: Reports Difficulty using hands for taps, buttons, cutlery, and/or writing: Denies   Review of Systems  Constitutional: Positive for fatigue. Negative for fever, night sweats, weight gain and weight loss.  HENT: Negative for ear pain, mouth sores, trouble swallowing, trouble swallowing, mouth dryness and nose dryness.   Eyes: Negative for pain, redness, visual disturbance and dryness.  Respiratory: Positive for cough. Negative for shortness of breath and difficulty breathing.   Cardiovascular: Negative for chest pain, palpitations, hypertension, irregular heartbeat and swelling in legs/feet.  Gastrointestinal: Negative for blood in stool, constipation and diarrhea.  Endocrine: Negative for increased urination.  Genitourinary: Negative for difficulty urinating and vaginal dryness.  Musculoskeletal: Positive for myalgias, morning stiffness and myalgias. Negative for arthralgias, joint pain, joint swelling, muscle weakness and muscle  tenderness.  Skin: Positive for color change. Negative for rash, hair loss, skin tightness, ulcers and sensitivity to sunlight.  Allergic/Immunologic: Negative for susceptible to infections.  Neurological: Negative for dizziness, numbness, memory loss, night sweats and weakness.  Hematological: Negative for swollen glands.  Psychiatric/Behavioral: Positive for sleep disturbance. Negative for depressed mood. The patient is not nervous/anxious.     PMFS History:  Patient Active Problem List   Diagnosis Date Noted  . ILD (interstitial lung disease) ([place]) 11/28/2016  . Multiple lung nodules on CT 11/28/2016  . Abnormal laboratory test 11/18/2016  . DDD L spine 11/18/2016  . 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 History of hypothyroidism 11/10/2016  . History of hemochromatosis 11/10/2016  . Pulmonary emphysema ([place]) 08/31/2016  . [name] phenomenon without gangrene 08/31/2016  . Sinusitis, chronic 08/31/2016  . Chronic cough 02/10/2016  . Irritable larynx 02/10/2016  . Stopped smoking with greater than 40 pack year history 02/10/2016  . Chest pain, unspecified 10/06/2010  . Shortness of breath 10/06/2010  . Hypothyroidism   . Hereditary hemochromatosis ([place]) 04/22/2010  . ATRIAL FIBRILLATION 10/20/2008  . [facility] 10/20/2008  . SYNCOPE AND COLLAPSE 10/20/2008    Past Medical History:  Diagnosis Date  . Atrial fibrillation ([place])    a. Flecainide therapy;  b. event monitor 4/12  . Chest pain    a. GXT myoview 4/12: no isch., EF 86%;   b. echo 4/12: EF 55-65%, grade 1 diast dysfxn, LAE  . Depression with anxiety   . GERD (gastroesophageal reflux disease)   . 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 Beryle Beams  .  History of syncope   . Hypothyroidism     Family History  Problem Relation Age of Onset  . Stroke Mother   . Heart disease Father 49  . Hemochromatosis Brother    Past Surgical History:  Procedure Laterality Date  . MOUTH SURGERY    .  SHOULDER ARTHROSCOPY Left   . TOTAL ABDOMINAL HYSTERECTOMY    . VARICOSE VEIN SURGERY     Social History   Social History Narrative   REGULAR EXERCISE     Objective: Vital Signs: BP 113/63 (BP Location: Left Arm, Patient Position: Sitting, Cuff Size: Normal)   Pulse 67   Ht 5' 3.5" (1.613 m)   Wt 140 lb (63.5 kg)   BMI 24.41 kg/m    Physical Exam  Constitutional: She is oriented to person, place, and time. She appears well-developed and well-nourished.  HENT:  Head: Normocephalic and atraumatic.  Eyes: Conjunctivae and EOM are normal.  Neck: Normal range of motion.  Cardiovascular: Normal rate, regular rhythm, normal heart sounds and intact distal pulses.  Pulmonary/Chest: Effort normal and breath sounds normal.  Abdominal: Soft. Bowel sounds are normal.  Lymphadenopathy:    She has no cervical adenopathy.  Neurological: She is alert and oriented to person, place, and time.  Skin: Skin is warm and dry. Capillary refill takes less than 2 seconds.  Psychiatric: She has a normal mood and affect. Her behavior is normal.  Nursing note and vitals reviewed.    Musculoskeletal Exam: C-spine thoracic spine good range of motion.  She has some limitation and discomfort range of motion of her lumbar spine.  Shoulder joints elbow joints wrist joints are good range of motion.  She has DIP PIP and CMC thickening in her hands consistent with osteoarthritis.  Hip joints and knee joints are good range of motion.  She has palpable Baker's cyst in her left knee.  CDAI Exam: No CDAI exam completed.    Investigation: No additional findings. CBC Latest Ref Rng & Units 11/30/2017 09/19/2017 09/11/2017  WBC 4.0 - 10.5 K/uL 7.6 6.2 7.8  Hemoglobin 12.0 - 15.0 g/dL 12.2 12.2 12.4  Hematocrit 36.0 - 46.0 % 36.2 36.4 38.5  Platelets 150.0 - 400.0 K/uL 250.0 234.0 269   CMP Latest Ref Rng & Units 11/30/2017 09/19/2017 09/11/2017  Glucose 70 - 99 mg/dL 105(H) 110(H) 112(H)  BUN 6 - 23 mg/dL 11 13 10     Creatinine 0.40 - 1.20 mg/dL 0.78 0.67 0.79  Sodium 135 - 145 mEq/L 139 135 137  Potassium 3.5 - 5.1 mEq/L 3.9 4.6 4.4  Chloride 96 - 112 mEq/L 102 104 103  CO2 19 - 32 mEq/L 28 27 24   Calcium 8.4 - 10.5 mg/dL 9.5 9.6 9.0  Total Protein 6.0 - 8.3 g/dL 6.9 7.2 7.2  Total Bilirubin 0.2 - 1.2 mg/dL 0.6 0.5 0.6  Alkaline Phos 39 - 117 U/L 44 48 57  AST 0 - 37 U/L 15 14 19   ALT 0 - 35 U/L 11 12 17     Imaging: No results found.  Speciality Comments: No specialty comments available.    Procedures:  No procedures performed Allergies: Patient has no known allergies.   Assessment / Plan:     Visit Diagnoses: [name] phenomenon without gangrene-patient states that her [name] is still active.  She had good capillary refill on examination and her hands were nice and warm to touch.  There were no digital ulcers.  She is on Xarelto.  Abnormal laboratory test - Positive anticardiolipin IgM  47, positive beta-2 IgM 31:  ILD (interstitial lung disease) (HCC) - Scl-70+.  She has been followed by Dr. Chase Caller.  High risk medication use - On Myfortic and prednisone 10 mg p.o. daily for her lung disease.  History of emphysema - Followed by Dr. Chase Caller   Multiple lung nodules on CT  DDD (degenerative disc disease), lumbar-she has chronic discomfort in her lower back.  Back exercises and maintaining good posture was discussed.  Primary osteoarthritis of both hands - mild  History of hypothyroidism  History of atrial fibrillation - Followed by Dr. Caryl Comes.   History of hemochromatosis - Followed up by hematology yearly.    Former smoker - with greater than 40 pack year history    Orders: No orders of the defined types were placed in this encounter.  No orders of the defined types were placed in this encounter.   Face-to-face time spent with patient was 30 minutes. >50% of time was spent in counseling and coordination of care.  Follow-Up Instructions: Return in about 6 months  (around 06/06/2018) for ILD, Raynauds.   Bo Merino, MD  Note - This record has been created using Editor, commissioning.  Chart creation errors have been sought, but may not always  have been located. Such creation errors do not reflect on  the standard of medical care.

## 2017-11-30 ENCOUNTER — Other Ambulatory Visit (INDEPENDENT_AMBULATORY_CARE_PROVIDER_SITE_OTHER): Payer: Medicare Other

## 2017-11-30 DIAGNOSIS — J849 Interstitial pulmonary disease, unspecified: Secondary | ICD-10-CM

## 2017-11-30 DIAGNOSIS — Z5181 Encounter for therapeutic drug level monitoring: Secondary | ICD-10-CM | POA: Diagnosis not present

## 2017-11-30 LAB — CBC WITH DIFFERENTIAL/PLATELET
BASOS ABS: 0.1 10*3/uL (ref 0.0–0.1)
Basophils Relative: 0.7 % (ref 0.0–3.0)
EOS PCT: 1 % (ref 0.0–5.0)
Eosinophils Absolute: 0.1 10*3/uL (ref 0.0–0.7)
HEMATOCRIT: 36.2 % (ref 36.0–46.0)
Hemoglobin: 12.2 g/dL (ref 12.0–15.0)
LYMPHS ABS: 1 10*3/uL (ref 0.7–4.0)
LYMPHS PCT: 13.4 % (ref 12.0–46.0)
MCHC: 33.8 g/dL (ref 30.0–36.0)
MCV: 98.5 fl (ref 78.0–100.0)
MONOS PCT: 4.9 % (ref 3.0–12.0)
Monocytes Absolute: 0.4 10*3/uL (ref 0.1–1.0)
NEUTROS PCT: 80 % — AB (ref 43.0–77.0)
Neutro Abs: 6.1 10*3/uL (ref 1.4–7.7)
Platelets: 250 10*3/uL (ref 150.0–400.0)
RBC: 3.67 Mil/uL — ABNORMAL LOW (ref 3.87–5.11)
RDW: 15 % (ref 11.5–15.5)
WBC: 7.6 10*3/uL (ref 4.0–10.5)

## 2017-11-30 LAB — BASIC METABOLIC PANEL
BUN: 11 mg/dL (ref 6–23)
CALCIUM: 9.5 mg/dL (ref 8.4–10.5)
CO2: 28 mEq/L (ref 19–32)
Chloride: 102 mEq/L (ref 96–112)
Creatinine, Ser: 0.78 mg/dL (ref 0.40–1.20)
GFR: 76.24 mL/min (ref >60.00)
GLUCOSE: 105 mg/dL — AB (ref 70–99)
POTASSIUM: 3.9 meq/L (ref 3.5–5.1)
SODIUM: 139 meq/L (ref 135–145)

## 2017-11-30 LAB — HEPATIC FUNCTION PANEL
ALBUMIN: 4.3 g/dL (ref 3.5–5.2)
ALT: 11 U/L (ref 0–35)
AST: 15 U/L (ref 0–37)
Alkaline Phosphatase: 44 U/L (ref 39–117)
BILIRUBIN TOTAL: 0.6 mg/dL (ref 0.2–1.2)
Bilirubin, Direct: 0.1 mg/dL (ref 0.0–0.3)
Total Protein: 6.9 g/dL (ref 6.0–8.3)

## 2017-12-01 ENCOUNTER — Telehealth: Payer: Self-pay | Admitting: Rheumatology

## 2017-12-01 NOTE — Telephone Encounter (Signed)
Per Dr. Estanislado Pandy Prescription faxed to [facility] to change prescription to Tizanidine 4 mg 1 po q hs # 30 tabs with 2 refills.

## 2017-12-01 NOTE — Telephone Encounter (Signed)
Samantha from [facility] left a voicemail stating they received a prescription for Methocarbamol 500 mg which is not listed under patient's insurance and they need an alternative medication.  Please call [phone]  Ref #[id number]

## 2017-12-05 ENCOUNTER — Encounter: Payer: Self-pay | Admitting: Rheumatology

## 2017-12-05 ENCOUNTER — Ambulatory Visit (INDEPENDENT_AMBULATORY_CARE_PROVIDER_SITE_OTHER): Payer: Medicare Other | Admitting: Rheumatology

## 2017-12-05 VITALS — BP 113/63 | HR 67 | Ht 63.5 in | Wt 140.0 lb

## 2017-12-05 DIAGNOSIS — J849 Interstitial pulmonary disease, unspecified: Secondary | ICD-10-CM

## 2017-12-05 DIAGNOSIS — Z8709 Personal history of other diseases of the respiratory system: Secondary | ICD-10-CM | POA: Diagnosis not present

## 2017-12-05 DIAGNOSIS — M5136 Other intervertebral disc degeneration, lumbar region: Secondary | ICD-10-CM | POA: Diagnosis not present

## 2017-12-05 DIAGNOSIS — Z87891 Personal history of nicotine dependence: Secondary | ICD-10-CM

## 2017-12-05 DIAGNOSIS — R918 Other nonspecific abnormal finding of lung field: Secondary | ICD-10-CM | POA: Diagnosis not present

## 2017-12-05 DIAGNOSIS — Z8679 Personal history of other diseases of the circulatory system: Secondary | ICD-10-CM

## 2017-12-05 DIAGNOSIS — R899 Unspecified abnormal finding in specimens from other organs, systems and tissues: Secondary | ICD-10-CM

## 2017-12-05 DIAGNOSIS — Z8639 Personal history of other endocrine, nutritional and metabolic disease: Secondary | ICD-10-CM | POA: Diagnosis not present

## 2017-12-05 DIAGNOSIS — M7122 Synovial cyst of popliteal space [Baker], left knee: Secondary | ICD-10-CM

## 2017-12-05 DIAGNOSIS — M19042 Primary osteoarthritis, left hand: Secondary | ICD-10-CM

## 2017-12-05 DIAGNOSIS — I7300 Raynaud's syndrome without gangrene: Secondary | ICD-10-CM | POA: Diagnosis not present

## 2017-12-05 DIAGNOSIS — Z79899 Other long term (current) drug therapy: Secondary | ICD-10-CM

## 2017-12-05 DIAGNOSIS — M19041 Primary osteoarthritis, right hand: Secondary | ICD-10-CM

## 2017-12-11 ENCOUNTER — Telehealth: Payer: Self-pay | Admitting: Internal Medicine

## 2017-12-11 NOTE — Telephone Encounter (Signed)
Called and spoke with pt letting her know the results of her labwork.  Pt expressed understanding.  Also scheduled pt's next f/u appt with MR having a 67min PFT before.  Pt's f/u appt is scheduled Thurs 8/8 having PFT at 12pm followed by OV at 1:30.  Nothing further needed at this time.

## 2017-12-13 ENCOUNTER — Telehealth: Payer: Self-pay | Admitting: Internal Medicine

## 2017-12-13 NOTE — Telephone Encounter (Signed)
Tara Harrier do not but need to know if she breaks into shingles rash. However  A) she needs to tell us if she breaks into shingles or not B) she should hold myfortic for the time she is taking the antiviral C) Did her PCP Chesley Noon, MD give her new shingrix vaccine?  Thanks      Immunization History  Administered Date(s) Administered  . Influenza, High Dose Seasonal PF 08/29/2017  . Pneumococcal Conjugate-13 06/12/2014  . Pneumococcal Polysaccharide-23 06/27/2010  . Tdap 03/16/2011  . Zoster 06/27/2010

## 2017-12-13 NOTE — Telephone Encounter (Signed)
Called and spoke with patient, she states that she has been having some pain in her neck and lower back so she went to her PCP. They placed her on prednisone and an antiviral medication because they think it is the early stages of shingles. She wants to make sure that these medications do not interact with her myfortic.   MR please advise, thank you.

## 2017-12-14 NOTE — Telephone Encounter (Signed)
Attempted to contact pt. I did not receive an answer. There was no option for me to leave a message. Will try back.  

## 2017-12-14 NOTE — Telephone Encounter (Signed)
Spoke with pt. She is aware of Dr. Golden Pop response. States that she has not had the update shingles vaccine. Pt is also requesting something for pain.  Dr. Chase Caller - please advise. Thanks.

## 2017-12-14 NOTE — Telephone Encounter (Signed)
Patient returned call, CB is [phone]

## 2017-12-15 NOTE — Telephone Encounter (Signed)
Called and spoke with pt stating the information per MR.  Pt stated she does not think it is coming from the myfortic.   Pt is wanting to know if any labwork can be done to rule out an infection and also wants to know if some labwork might be able to be done to see if pt does indeed have the shingles virus in her system.  MR, please advise on this if we can order labwork for pt, and if so, which labs?  Thanks!

## 2017-12-15 NOTE — Telephone Encounter (Signed)
I would not be able to treat pain My advise is to hold  Myfortic till she is done wih the anti-viral

## 2017-12-16 ENCOUNTER — Encounter (HOSPITAL_BASED_OUTPATIENT_CLINIC_OR_DEPARTMENT_OTHER): Payer: Self-pay | Admitting: Emergency Medicine

## 2017-12-16 ENCOUNTER — Other Ambulatory Visit: Payer: Self-pay

## 2017-12-16 ENCOUNTER — Emergency Department (HOSPITAL_BASED_OUTPATIENT_CLINIC_OR_DEPARTMENT_OTHER)
Admission: EM | Admit: 2017-12-16 | Discharge: 2017-12-16 | Disposition: A | Discharge status: Home / Self Care | Payer: Medicare Other | Attending: Emergency Medicine | Admitting: Emergency Medicine

## 2017-12-16 DIAGNOSIS — E039 Hypothyroidism, unspecified: Secondary | ICD-10-CM | POA: Insufficient documentation

## 2017-12-16 DIAGNOSIS — Z87891 Personal history of nicotine dependence: Secondary | ICD-10-CM | POA: Diagnosis not present

## 2017-12-16 DIAGNOSIS — M5412 Radiculopathy, cervical region: Secondary | ICD-10-CM | POA: Diagnosis not present

## 2017-12-16 DIAGNOSIS — I4891 Unspecified atrial fibrillation: Secondary | ICD-10-CM | POA: Diagnosis not present

## 2017-12-16 DIAGNOSIS — M542 Cervicalgia: Secondary | ICD-10-CM | POA: Diagnosis present

## 2017-12-16 DIAGNOSIS — B029 Zoster without complications: Secondary | ICD-10-CM | POA: Diagnosis not present

## 2017-12-16 DIAGNOSIS — Z79899 Other long term (current) drug therapy: Secondary | ICD-10-CM | POA: Insufficient documentation

## 2017-12-16 DIAGNOSIS — Z7901 Long term (current) use of anticoagulants: Secondary | ICD-10-CM | POA: Insufficient documentation

## 2017-12-16 MED ORDER — GABAPENTIN 100 MG PO CAPS: 100.0000 mg | ORAL_CAPSULE | Freq: Three times a day (TID) | ORAL | 0 refills | Status: DC

## 2017-12-16 MED ORDER — HYDROCODONE-ACETAMINOPHEN 5-325 MG PO TABS
1.0000 | ORAL_TABLET | Freq: Once | ORAL | Status: AC
  Administered 2017-12-16: 1 via ORAL
  Filled 2017-12-16: qty 1

## 2017-12-16 MED ORDER — HYDROCODONE-ACETAMINOPHEN 5-325 MG PO TABS: 1.0000 | ORAL_TABLET | Freq: Four times a day (QID) | ORAL | 0 refills | Status: DC | PRN

## 2017-12-16 NOTE — ED Provider Notes (Signed)
[facility] EMERGENCY DEPARTMENT Provider Note   CSN: [id number] Arrival date & time: 12/16/17  1031     History   Chief Complaint Chief Complaint  Patient presents with  . Neck pain    HPI [name] is a 77 y.o. female.  HPI  77 year old female presents with right-sided neck pain and arm pain.  Started about 8 days ago.  Saw her doctor a few days ago and was told it might be shingles versus cervical radiculopathy.  Put on prednisone as well as Valtrex.  She was having some hypersensitivity to her medial arm which led the doctor to think there might be a shingles component though there has been no rash until today.  The pain is not well controlled.  She did take a hydrocodone this morning after she was given 3 by her doctor and states this did help.  Otherwise denies weakness or numbness in her extremity.  It radiates from her trapezius up to her head and down her arm.  Lesions starting today are new.  Past Medical History:  Diagnosis Date  . Atrial fibrillation ([place])    a. Flecainide therapy;  b. event monitor 4/12  . Chest pain    a. GXT myoview 4/12: no isch., EF 86%;   b. echo 4/12: EF 55-65%, grade 1 diast dysfxn, LAE  . Depression with anxiety   . GERD (gastroesophageal reflux disease)   . 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 Beryle Beams  . History of syncope   . Hypothyroidism     Patient Active Problem List   Diagnosis Date Noted  . ILD (interstitial lung disease) ([place]) 11/28/2016  . Multiple lung nodules on CT 11/28/2016  . Abnormal laboratory test 11/18/2016  . DDD L spine 11/18/2016  . 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 History of hypothyroidism 11/10/2016  . History of hemochromatosis 11/10/2016  . Pulmonary emphysema ([place]) 08/31/2016  . [name] phenomenon without gangrene 08/31/2016  . Sinusitis, chronic 08/31/2016  . Chronic cough 02/10/2016  . Irritable larynx 02/10/2016  . Stopped smoking with greater  than 40 pack year history 02/10/2016  . Chest pain, unspecified 10/06/2010  . Shortness of breath 10/06/2010  . Hypothyroidism   . Hereditary hemochromatosis ([place]) 04/22/2010  . ATRIAL FIBRILLATION 10/20/2008  . [facility] 10/20/2008  . SYNCOPE AND COLLAPSE 10/20/2008    Past Surgical History:  Procedure Laterality Date  . MOUTH SURGERY    . SHOULDER ARTHROSCOPY Left   . TOTAL ABDOMINAL HYSTERECTOMY    . VARICOSE VEIN SURGERY       OB History   None      Home Medications    Prior to Admission medications   Medication Sig Start Date End Date Taking? Authorizing Provider  Biotin 1000 MCG tablet Take 1,000 mcg by mouth daily.    [provider]  Calcium Carbonate 1500 MG TABS Take 1 tablet by mouth 3 (three) times a week.    [provider]  Cholecalciferol (VITAMIN D PO) Take 5,000 Units by mouth daily.     [provider]  flecainide (TAMBOCOR) 100 MG tablet TAKE ONE-HALF TABLET BY  MOUTH IN THE MORNING AND  ONE-HALF TABLET IN THE  EVENING 07/03/17   Deboraha Sprang, MD  fluticasone [facility]) 50 MCG/ACT nasal spray Place 2 sprays into both nostrils daily. 08/31/16   Brand Males, MD  folic acid (FOLVITE) 1 MG tablet Take 1 mg by mouth daily.      [provider]  gabapentin (NEURONTIN) 100 MG capsule Take 1 capsule (100 mg total) by mouth 3 (three) times daily. 12/16/17   Sherwood Gambler, MD  HYDROcodone-acetaminophen (NORCO) 5-325 MG tablet Take 1 tablet by mouth every 6 (six) hours as needed. 12/16/17   Sherwood Gambler, MD  levothyroxine (SYNTHROID, LEVOTHROID) 112 MCG tablet Take 112 mcg by mouth daily.      [provider]  methocarbamol (ROBAXIN) 500 MG tablet TAKE 1 TABLET BY MOUTH AT  BEDTIME AS NEEDED FOR  MUSCLE SPASM(S) Patient not taking: Reported on 12/05/2017 10/05/17   Bo Merino, MD  methocarbamol (ROBAXIN) 750 MG tablet Take 1 tablet (750 mg total) by mouth at bedtime as needed for muscle spasms. 04/12/17   Bo Merino, MD  metoprolol succinate (TOPROL-XL) 25 MG 24 hr tablet TAKE 1 TABLET BY MOUTH  DAILY 05/29/17   Deboraha Sprang, MD  mycophenolate (MYFORTIC) 360 MG TBEC EC tablet Take 1 tablet (360 mg total) by mouth 2 (two) times daily. 09/19/17   Brand Males, MD  omeprazole (PRILOSEC) 40 MG capsule Take 40 mg by mouth daily.    [provider]  ondansetron (ZOFRAN ODT) 4 MG disintegrating tablet Take 1-2 tablets three times daily as needed 09/22/17   Brand Males, MD  predniSONE (DELTASONE) 10 MG tablet Take 1 tablet (10 mg total) by mouth daily with breakfast. 08/16/17   Brand Males, MD  SPIRIVA RESPIMAT 2.5 MCG/ACT AERS INHALE 2 PUFFS INTO THE LUNGS DAILY 08/17/17   Brand Males, MD  sulfamethoxazole-trimethoprim (BACTRIM DS,SEPTRA DS) 800-160 MG tablet Take 1tablet Monday, 1tablet Wednesday, 1tablet Friday 08/16/17   Brand Males, MD  VITAMIN B1-B12 IM Inject 1,000 mcg into the muscle every 30 (thirty) days.     [provider]  VITAMIN E PO Take 1 tablet by mouth daily.     [provider]  XARELTO 20 MG TABS tablet TAKE 1 TABLET BY MOUTH ONCE DAILY WITH FOOD 09/28/17   Deboraha Sprang, MD    Family History Family History  Problem Relation Age of Onset  . Stroke Mother   . Heart disease Father 18  . Hemochromatosis Brother     Social History Social History   Tobacco Use  . Smoking status: Former Smoker    Packs/day: 1.00    Years: 42.00    Pack years: 42.00    Last attempt to quit: 06/27/1997    Years since quitting: 20.4  . Smokeless tobacco: Never Used  Substance Use Topics  . Alcohol use: Yes  . Drug use: No     Allergies   Patient has no known allergies.   Review of Systems Review of Systems  Constitutional: Negative for fever.  Cardiovascular: Negative for chest pain.  Musculoskeletal: Positive for neck pain.  Skin: Positive for rash.  Neurological: Negative for weakness and numbness.     Physical Exam Updated Vital  Signs BP 122/67 (BP Location: Right Arm)   Pulse 77   Temp 97.9 F (36.6 C) (Oral)   Resp 18   Ht 5' 3.5" (1.613 m)   Wt 64.9 kg (143 lb)   SpO2 99%   BMI 24.93 kg/m   Physical Exam  Constitutional: She is oriented to person, place, and time. She appears well-developed and well-nourished.  HENT:  Head: Normocephalic and atraumatic.  Right Ear: External ear normal.  Left Ear: External ear normal.  Nose: Nose normal.  Eyes: Right eye exhibits no discharge. Left eye exhibits no discharge.  Neck: Neck  supple. Muscular tenderness present. No spinous process tenderness present.    Cardiovascular: Normal rate and regular rhythm.  Pulses:      Radial pulses are 2+ on the right side.  Pulmonary/Chest: Effort normal and breath sounds normal.  Abdominal: She exhibits no distension.  Neurological: She is alert and oriented to person, place, and time.  5/5 strength in BUE. Normal gross sensation.  Skin: Skin is warm and dry. Rash noted. Rash is vesicular.     Vesicular lesions scattered throughout the right upper extremity as indicated.  Nursing note and vitals reviewed.    ED Treatments / Results  Labs (all labs ordered are listed, but only abnormal results are displayed) Labs Reviewed - No data to display  EKG None  Radiology No results found.  Procedures Procedures (including critical care time)  Medications Ordered in ED Medications  HYDROcodone-acetaminophen (NORCO/VICODIN) 5-325 MG per tablet 1 tablet (has no administration in time range)     Initial Impression / Assessment and Plan / ED Course  I have reviewed the triage vital signs and the nursing notes.  Pertinent labs & imaging results that were available during my care of the patient were reviewed by me and considered in my medical decision making (see chart for details).     Patient has some symptoms that could be consistent with radiculopathy with the pain radiating from her trapezius to her paraspinal  neck and down her arm.  However she also has small vesicles in different branches that could be consistent with herpes zoster.  She is already on Valtrex.  There could be a neuropathic component and so I will start her on gabapentin as well as give her a few Norco to help control the severe pain.  Otherwise she does not have any acute neurologic compromise to suggest needing an emergent MRI.  No cardiac symptoms.  Discharged home to follow-up with PCP.  Final Clinical Impressions(s) / ED Diagnoses   Final diagnoses:  Herpes zoster without complication  Cervical radiculopathy    ED Discharge Orders        Ordered    HYDROcodone-acetaminophen (NORCO) 5-325 MG tablet  Every 6 hours PRN     12/16/17 1325    gabapentin (NEURONTIN) 100 MG capsule  3 times daily     12/16/17 1325       Sherwood Gambler, MD 12/16/17 1519

## 2017-12-16 NOTE — ED Triage Notes (Signed)
Neck pain radiating into her arm for over a week. Pt was seen by PCP and given prednisone and tx with an antiviral for possible shingles. Pt reports pain persists.

## 2017-12-16 NOTE — ED Notes (Signed)
Pt teaching provided on medications that may cause drowsiness. Pt instructed not to drive or operate heavy machinery while taking the prescribed medication. Pt verbalized understanding.   

## 2017-12-17 ENCOUNTER — Encounter: Payer: Self-pay | Admitting: Internal Medicine

## 2017-12-17 NOTE — Telephone Encounter (Signed)
Lot of back and forth. IS there an ILD clinic slot this wee ? Can she come in?

## 2017-12-18 NOTE — Telephone Encounter (Signed)
Spoke with patient. She stated that she does indeed have shingles. Advised patient I would route this message over to MR.   MR, please advise. Thanks!

## 2017-12-18 NOTE — Telephone Encounter (Signed)
She states she does indeed have shingles and was told to call us back and let us know this ASAP. CB is [phone].

## 2017-12-18 NOTE — Telephone Encounter (Signed)
D/w patient - On 6/18 0 she got pain in right neck and right arm. On wednesdya 6/19 started on valtrex. Then by Friday 6/21 developoed rash. Confirmed as shingles on 12/16/17 in ER. Still on valtex and opioid and gabapentin for pain that is severe.   Discussed shingles vaccine - she got this  In 2012. She recollects me mentiioning getting new shingles vaccine shingrix but says she decided against it thinking it was "live" vaccine (mistaken)  Plan  - shingles mgmt by PCP - hold myfortic for 48h as holiday while on valtrex and then restart - when dust settle she is to talk to PCP Chesley Noon, MD about shingrix   Dr. Brand Males, M.D., [facility].C.P Pulmonary and Critical Care Medicine Staff Physician, [place] Director - Interstitial Lung Disease  Program  Pulmonary [place] [facility] [place], [place], [address]  Pager: [phone], If no answer or between  15:00h - 7:00h: call [phone] Telephone: [phone]

## 2017-12-20 NOTE — Progress Notes (Signed)
Spoke with pt and notified of results per Dr. Ramaswamy. Pt verbalized understanding and denied any questions.  

## 2018-02-01 ENCOUNTER — Encounter: Payer: Self-pay | Admitting: Internal Medicine

## 2018-02-01 ENCOUNTER — Ambulatory Visit (INDEPENDENT_AMBULATORY_CARE_PROVIDER_SITE_OTHER): Payer: Medicare Other | Admitting: Internal Medicine

## 2018-02-01 ENCOUNTER — Other Ambulatory Visit (INDEPENDENT_AMBULATORY_CARE_PROVIDER_SITE_OTHER): Payer: Medicare Other

## 2018-02-01 VITALS — BP 120/70 | HR 80

## 2018-02-01 DIAGNOSIS — R768 Other specified abnormal immunological findings in serum: Secondary | ICD-10-CM

## 2018-02-01 DIAGNOSIS — J849 Interstitial pulmonary disease, unspecified: Secondary | ICD-10-CM

## 2018-02-01 DIAGNOSIS — J8410 Pulmonary fibrosis, unspecified: Secondary | ICD-10-CM

## 2018-02-01 DIAGNOSIS — I7300 Raynaud's syndrome without gangrene: Secondary | ICD-10-CM

## 2018-02-01 DIAGNOSIS — Z79899 Other long term (current) drug therapy: Secondary | ICD-10-CM

## 2018-02-01 DIAGNOSIS — J439 Emphysema, unspecified: Secondary | ICD-10-CM

## 2018-02-01 DIAGNOSIS — Z5181 Encounter for therapeutic drug level monitoring: Secondary | ICD-10-CM

## 2018-02-01 LAB — BASIC METABOLIC PANEL
BUN: 12 mg/dL (ref 6–23)
CALCIUM: 9.5 mg/dL (ref 8.4–10.5)
CO2: 28 mEq/L (ref 19–32)
CREATININE: 0.86 mg/dL (ref 0.40–1.20)
Chloride: 103 mEq/L (ref 96–112)
GFR: 68.08 mL/min (ref >60.00)
Glucose, Bld: 167 mg/dL — ABNORMAL HIGH (ref 70–99)
Potassium: 3.8 mEq/L (ref 3.5–5.1)
Sodium: 139 mEq/L (ref 135–145)

## 2018-02-01 LAB — CBC
HEMATOCRIT: 35.7 % — AB (ref 36.0–46.0)
Hemoglobin: 11.9 g/dL — ABNORMAL LOW (ref 12.0–15.0)
MCHC: 33.4 g/dL (ref 30.0–36.0)
MCV: 102.3 fl — ABNORMAL HIGH (ref 78.0–100.0)
Platelets: 250 10*3/uL (ref 150.0–400.0)
RBC: 3.49 Mil/uL — ABNORMAL LOW (ref 3.87–5.11)
RDW: 16 % — AB (ref 11.5–15.5)
WBC: 7.2 10*3/uL (ref 4.0–10.5)

## 2018-02-01 LAB — HEPATIC FUNCTION PANEL
ALK PHOS: 40 U/L (ref 39–117)
ALT: 10 U/L (ref 0–35)
AST: 12 U/L (ref 0–37)
Albumin: 4.2 g/dL (ref 3.5–5.2)
BILIRUBIN DIRECT: 0.1 mg/dL (ref 0.0–0.3)
TOTAL PROTEIN: 6.9 g/dL (ref 6.0–8.3)
Total Bilirubin: 0.6 mg/dL (ref 0.2–1.2)

## 2018-02-01 LAB — PULMONARY FUNCTION TEST
DL/VA % pred: 60 %
DL/VA: 2.86 ml/min/mmHg/L
DLCO UNC: 13.07 ml/min/mmHg
DLCO unc % pred: 54 %
FEF 25-75 Pre: 1.06 L/sec
FEF2575-%Pred-Pre: 67 %
FEV1-%PRED-PRE: 96 %
FEV1-Pre: 1.95 L
FEV1FVC-%Pred-Pre: 90 %
FEV6-%Pred-Pre: 110 %
FEV6-PRE: 2.84 L
FEV6FVC-%PRED-PRE: 104 %
FVC-%PRED-PRE: 105 %
FVC-PRE: 2.87 L
PRE FEV6/FVC RATIO: 99 %
Pre FEV1/FVC ratio: 68 %

## 2018-02-01 NOTE — Progress Notes (Signed)
Subjective:     Patient ID: [name], female   DOB: 1941/02/27, 77 y.o.   MRN: [id number]  HPI   PCP [name],[name], PA-C   HPI  IOV 02/10/2016  Chief Complaint  Patient presents with  . Pulmonary Consult    Pt referred by Dr. Melford Aase for chronic cough x 1 year. Pt states she feels she has a tickle that is causing her dry cough. Pt states she has DOE when climbing stairs. Pt deneis CP/tightness.     77 year old female with hemochromatosis homozygous gene followed by Dr. Beryle Beams (last phlebotomy many years ago and is on serial monitoring) with children and siblings with active disease. She has also atrial fibrillation followed by Dr. Caryl Comes. Reports insidious onset of chronic cough in the last 1 year. It is stable since onset. It fluctuates between mild and severe in severity. It is mostly dry in quality. Mostly present in the daytime but sometimes also wakes her up at night. It is definitely not progressive. It is episodic and present every day. Aggravated by talking sometimes and then the throat feels dry associated with tickle in the throat and also sensation the cough is coming from the upper chest retrosternally and sometimes relieved by drinking water or chewing on a lozenge. Also aggravated by seasonal changes particularly in the spring and the fall which makes her think she has allergies. There is sometimes associated gag. She also reports nonspecific occasional wheezing and associated shortness of breath that is nonspecific but present with exertion and relieved by rest No other clear cut aggravating or relieving factors.   cough associated history  - Medications: She is not on fish oil or ACE inhibitors - Sinus drainage: She does admit to spring allergies. She did have something removed from her hard palate several years ago by ENT does not know details - Acid reflux: She says she is significant acid reflux and is on Prilosec. Without which she'll have significant  symptoms - Pulmonary disease: FenO 12 02/10/2016  and normal. She denies any personal history of asthma or pulmonary fibrosis of COPD or emphysema smoked one pack per day started smoking at age 78 and quit in 1999. Making a 42 pack smoking history. Chest x-ray 08/26/2014 personally visualized is clear - Tobacco :  reports that she quit smoking about 18 years ago. She has a 42.00 pack-year smoking history. She has never used smokeless tobacco.       OV 02/18/2016  Chief Complaint  Patient presents with  . Follow-up    Pt here after PFT and HRCT. Pt denies changes in SOB and cough. Pt denies any new complaints at this time.    Follow-up chronic cough. This visit is to follow-up on test results of function test and high resolution CT chest which are described below9 results show isolated reduction in diffusion capacity which can be explained by possible early ILD and also emphysema. There are also new findings of nodule 6 mm bilaterally on lower lobes. She prefers a very cautious approach to this evaluation.     Pulmonary function test 02/12/2016 - FVC 2.8 cm/101%, FEV1 1.9 L/91%. Ratio 60/90%. Total lung capacity 115%. DLCO reduced at 14.5/60%. She is isolated reduction in diffusion capacity    HRCT chest 02/16/16 IMPRESSION: 1. Mild patchy subpleural reticulation in both lungs with a basilar predominance. No significant traction bronchiectasis. No frank honeycombing. Findings could represent an interstitial lung disease such as nonspecific interstitial pneumonia (NSIP), with early usual interstitial pneumonia (UIP) not  excluded. A follow-up high-resolution chest CT study in 12 months is recommended to assess temporal pattern stability. 2. Bilateral lower lobe solid pulmonary nodules, largest 6 mm. Non-contrast chest CT at 3-6 months is recommended. If the nodules are stable at time of repeat CT, then future CT at 18-24 months (from today's scan) is considered optional for low-risk  patients, but is recommended for high-risk patients. This recommendation follows the consensus statement: Guidelines for Management of Incidental Pulmonary Nodules Detected on CT Images:From the Fleischner Society 2017; published online before print (10.1148/radiol.3614431540). 3. Additional findings include mild-to-moderate centrilobular emphysema, aortic atherosclerosis and 2 vessel coronary atherosclerosis.   Electronically Signed   By: Ilona Sorrel M.D.   On: 02/16/2016 14:14   OV 08/31/2016  Chief Complaint  Patient presents with  . Follow-up    HRCT was never scheduled. Pt states that the cough is still present >> slightly improved since last OV. Pt states that she feels her allergies has gotten it ramped up again.    Follow-up multifactorial cough associated with autoimmune antibody positive, interstitial lung disease and emphysema previous history of smoking  After last visit in August 2017 she had high resolution CT chest in December 2017 that showed persistence of ILD. I personally visualized the CT chest. He comes for follow-up. She tells me that since August 2017 she has insidious onset of shortness of breath and is progressively worse. It is mild and present only on inclines is not therefore tenderness. In terms of her cough is persistent. It is associated with significant sinus drainage that she thinks is allergy related. She constantly clears her throat. Lab review shows autoimmune antibody positive in August 2017 with scleroderma antibody slightly elevated at 6.4. I referred her to rheumatology but she does not remember this and she did not make this follow-up. She also tells me that she definitely has a long history of [name] phenomena in her fingers but no one has ever formally diagnosed with connective tissu  e disease.  OV 11/28/2016  Chief Complaint  Patient presents with  . Follow-up    Pt here after PFT. Pt states her breathing is uchanged since last OV. Pt  c/o dry cough.- pt states this has slightly improved since last OV. Pt denies CP/tightness and f/c/s.     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 Since then she has seen rheumatologis Dr. Keturah Barre. I reviewd the notes and also discussed with the patient wa a good understading of what s going on. She tells me that some of the lupus antibodies have been positive correlating with Raynaud. Patient is on beta blocker for atrial fbrillation and it is the preference by the rheumatologist Dr. D that she switc to calcium channel blocker. She has a follow-up appointment pending with her electrophysiologist Dr. Caryl Comes.  She is scleroderma antibody positive but according to her rheumtolgist she does not have dermatologic  Manifestation of the disese. 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 She pulm  function test today and this is stable. In terms of arrest or symptoms she stable. The cough is actually improved. But she does get dyspneic walking up stairs especially a few flights. She does not wnt her to pulmonary habilitation because se exrcises on the treadmill although she does not monitor her saturations.   reports that she quit smoking about 19 years  ago. She has a 42.00 pack-year smoking history. she has never used smokeless tobacco.   OV 06/12/2017  Chief Complaint  Patient presents with  . Follow-up    Pt states that she has been doing good since last visit. States that she has a "tickle cough" that is sporadic and has SOB that she states is when she climbs 2 flights of stairs. Denies any CP.    Follow-up combined emphysema was interstitial lung disease [autoimmune undifferentiated connective tissue disease interstitial lung disease]. Autoimmune features from May 2018 rheumatology noted with Dr. Keturah Barre: Raynaud phenomenon positive  without gangrene, positive anti-cardiolipin and positive IgM and SCL-70 positive without clinical features of scleroderma    Last seen June 2018. Since then she's stable. Overall she tells me that only problem is a tickle in her throat and slight cough. This is up and down depending on the pollen exposure she gets. She gets dyspneic for climbing few to several flights of stairs. This is unchanged. She did have full function test today and that shows mild significant worsening though overall gradient is only mild.. However she's not feeling this. She has appointment with Dr. Keturah Barre  pending    OV 07/18/2017  Chief Complaint  Patient presents with  . Follow-up    HRCT done 07/03/17.  Pt states she is the same as she was at last visit. SOB with exertion.    Follow-up combined emphysema was interstitial lung disease [autoimmune undifferentiated connective tissue disease interstitial lung disease]. Autoimmune features from May 2018 rheumatology noted with Dr. Keturah Barre: Raynaud phenomenon positive without gangrene, positive anti-cardiolipin and positive IgM and SCL-70 positive without clinical features of scleroderma    AKSHITHA CULMER presents for follow-up.  She is here to discuss the results of a high-resolution CT scan of the chest.  The scan was reviewed by Dr. Rosario Jacks thoracic radiology who feels that patient has probable UIP pattern that is definitely progressive compared to August 2017 although there is no comment about progression since June 2018 CT scan.  The pulmonary nodules itself are stable since August 2017 and are likely benign.  The change in the CT scan which is mild progression pulmonary fibrosis corresponds with the pulmonary function test that shows mild progression.  She is now here with her daughter Junie Panning and her husband.  She also states that 1 of her other daughters has rheumatoid arthritis and is on TNF alpha blockade.  All her 3 daughters have hemo-chromatosis    OV 08/29/2017  Chief  Complaint  Patient presents with  . Follow-up    Pt states she has been doing good since last visit. Pt recently went to [place] and had some mild problems with SOB due to heat.  Pt was prescribed cellcept and bactrim but has not yet started meds due to questions with other meds she is taking.    77 year old female with ILD secondary to autoimmune disease clinically suspicious of scleroderma but also previous history of hemochromatosis with family history of hemochromatosis.  She is here with her husband.  This visit is only a discussion visit.  Because she has many questions about starting CellCept before she actually does.  We did hepatitis virus panel, QuantiFERON gold, G6PD and all this is normal.  Lab work is normal.  I checked with [facility] pharmacist about interactions and was cleared for her to start CellCept.  The only recommendation was for patient to come off Prilosec because of reduced effect of CellCept.  Patient questions revolved  around CellCept: She does not want to come off PPI because of hiatal hernia and has had bad acid reflux.  So I would not Willis Modena asking for any alternative PPI.  In addition she states she takes multiple multivitamins including Biotene, vitamin D3, vitamin D, vitamin A21 and folic acid.  She also takes Allegra occasionally.  She wants to make sure it is all okay with CellCept.  She has not had a flu shot today and she is asking if she should have it.  She has never had flu shot before.  She is also wondering about anticoagulation in the setting of CellCept and thyroid issues in the setting of Bactrim.  In terms of her genetics: Initially I corresponded with our local geneticist who thought she might be better served at [facility] but the [facility] genetics person wrote to me saying that patient should first be seen by [facility] rheumatology and hematology.  Patient's not so sure she wants to see the subspecialist.  She will speak to Dr.  Beryle Beams her local hematologist to see if it is worthwhile seeing the hematology department at [facility].   Lungs/Pleura: Moderate centrilobular emphysema. Mild basilar predominant subpleural reticulation and ground-glass, increased from 02/16/2016. No traction bronchiectasis/ bronchiolectasis, architectural distortion or honeycombing. Side-by-side nodules in the medial left lower lobe measure up to 6 mm (series 3, image 95), unchanged from 02/16/2016 and considered benign. No air trapping. No pleural fluid. Airway is unremarkable.  IMPRESSION: 1. Mild progression in basilar predominant subpleural fibrosis from 02/16/2016, raising suspicion for usual interstitial pneumonitis. 2. Aortic atherosclerosis (ICD10-170.0). Moderate coronary artery calcification. 3.  Emphysema (ICD10-J43.9).   Electronically Signed   By: Lorin Picket M.D.   On: 07/03/2017 09:54    OV 09/19/2017  Chief Complaint  Patient presents with  . Follow-up    Pt states she has been doing good. Currently on cellcept and bactrim and has been doing well on it once put back on omeprazole. Pt still has the cough and sometimes when trying to take a deep breath can't fully get all O2 out.   Kiah Vanalstine returns for follow-up.  This is for autoimmune interstitial lung disease.  She was started on CellCept recently.  She is here to follow-up for therapeutic drug monitoring.  Because she was on omeprazole and we were initiating CellCept with Bactrim we asked her to hold off on omeprazole because the omeprazole impairs drug levels of CellCept.  However she tried it for a week and her acid reflux was significant despite ranitidine so she went back on omeprazole.  Pharmacist Willis Modena is advising changing the CellCept to equivaekbtdose of Myfortic. So far tolerating cellcept/pred/bactrimm well. Has seen dR G in heme and on observation. Had PFT today on cellcept/pred/bactrim and is better! Continue siwht  spriva  OV 10/31/2017  . Chief Complaint  Patient presents with  . Follow-up    Pt currently taking myfortic and states she has been well on that. Pt states she has some mild nausea and has some problems with sleeping at night. SOB is stable,mild coughing. Denies any CP.    Follow-up progressive interstitial lung disease with SCL-70 antibody positive and Raynard Follow-up associated emphysema.  Last visit September 19, 2017.  At that time she had intolerance to the CellCept therefore we switched her to Myfortic.  She is here to report for follow-up about this.  She is tolerating Myfortic just well.  She had some transient insomnia for a few days last week  but this resolved.  She only has mild intermittent occasional nausea with the Myfortic but otherwise is tolerating it really well at the low-dose of 360 mg twice daily.  She continues on prednisone 10 mg daily and Bactrim 3 times a week.  She is due for blood work today.  There are no other new issues.  She continues on Spiriva for her emphysema   OV 02/01/2018  Chief Complaint  Patient presents with  . Follow-up    PFT performed today.  Pt stated other than having shingles earlier, things have been doing good for her. Pt states SOB is stable and states she still has an occ cough.    Follow-up progressive interstitial lung disease with SCL-70 antibody positive and Raynud - IPAF Follow-up associated emphysema   ANAHITA CUA presents for routine follow-up with interstitial lung disease clinic. She has the above problems. In the interim she tells me she had developed shingles on the right neck but this is resolved. This is despite having zoster vaccine in 2012. She was yet to have the bnnew  shingles vaccine.in any event currently she is feeling well. She only has occasional post residual neuropathic pain. In terms of her shortness of breath it is stable and minimal. Walking desaturation test shows stability. Pulmicort function test below  show stability. She is tolerating the mycophenolate myfortic well at 360 mg twice daily and prednisone 10 mg daily and Bactrim for PCP prophylaxis. Overall she feels stable and feels that it is best medicines on touch in terms of dosing change    Results for ARMONI, KLUDT (MRN [id number]) as of 11/28/2016 12:08  Ref. Range 02/12/2016 12:57 11/28/2016 10:53 06/12/2017  09/19/2017 On cellcept/Pred/.bactrim  02/01/2018   FVC-Pre Latest Units: L 2.85 2.81 2.68 3.04 2.87  FVC-%Pred-Pre Latest Units: % 101 101 97 111 105%    Results for DENISA, ENTERLINE (MRN [id number]) as of 11/28/2016 12:08  Ref. Range 02/12/2016 12:57 11/28/2016 10:53 06/12/2017  09/19/2017  02/01/2018   DLCO cor Latest Units: ml/min/mmHg 14.54 13.36 12.17 14.29 13.07  DLCO cor % pred Latest Units: % 60 55 50 59 54%  CT     Jan 2019 - mild prog in CT since jan 2019    Simple office walk 185 feet x  3 laps goal with forehead probe 10/31/2017  02/01/2018   O2 used Room air Room air  Number laps completed 3 3  Comments about pace Normal pace normal  Resting Pulse Ox/HR 98% and 63/min 99% and 80/min  Final Pulse Ox/HR 99% and 80/min 97% and 91/min  Desaturated </= 88% no no  Desaturated <= 3% points no no  Got Tachycardic >/= 90/min no yes  Symptoms at end of test none Mild dyspnea  Miscellaneous comments none x      has a past medical history of Atrial fibrillation ([place]), Chest pain, Depression with anxiety, GERD (gastroesophageal reflux disease), Hemochromatosis, History of syncope, and Hypothyroidism.   reports that she quit smoking about 20 years ago. She has a 42.00 pack-year smoking history. She has never used smokeless tobacco.  Past Surgical History:  Procedure Laterality Date  . MOUTH SURGERY    . SHOULDER ARTHROSCOPY Left   . TOTAL ABDOMINAL HYSTERECTOMY    . VARICOSE VEIN SURGERY      No Known Allergies  Immunization History  Administered Date(s) Administered  . Influenza, High Dose Seasonal PF 08/29/2017   . Pneumococcal Conjugate-13 06/12/2014  . Pneumococcal Polysaccharide-23 06/27/2010  . Tdap 03/16/2011  .  Zoster 06/27/2010    Family History  Problem Relation Age of Onset  . Stroke Mother   . Heart disease Father 53  . Hemochromatosis Brother      Current Outpatient Medications:  .  Biotin 1000 MCG tablet, Take 1,000 mcg by mouth daily., Disp: , Rfl:  .  Calcium Carbonate 1500 MG TABS, Take 1 tablet by mouth 3 (three) times a week., Disp: , Rfl:  .  Cholecalciferol (VITAMIN D PO), Take 5,000 Units by mouth daily. , Disp: , Rfl:  .  flecainide (TAMBOCOR) 100 MG tablet, TAKE ONE-HALF TABLET BY  MOUTH IN THE MORNING AND  ONE-HALF TABLET IN THE  EVENING, Disp: 90 tablet, Rfl: 1 .  fluticasone (FLONASE) 50 MCG/ACT nasal spray, Place 2 sprays into both nostrils daily., Disp: 16 g, Rfl: 5 .  folic acid (FOLVITE) 1 MG tablet, Take 1 mg by mouth daily.  , Disp: , Rfl:  .  levothyroxine (SYNTHROID, LEVOTHROID) 112 MCG tablet, Take 112 mcg by mouth daily.  , Disp: , Rfl:  .  metoprolol succinate (TOPROL-XL) 25 MG 24 hr tablet, TAKE 1 TABLET BY MOUTH  DAILY, Disp: 90 tablet, Rfl: 2 .  mycophenolate (MYFORTIC) 360 MG TBEC EC tablet, Take 1 tablet (360 mg total) by mouth 2 (two) times daily., Disp: 120 tablet, Rfl: 2 .  omeprazole (PRILOSEC) 40 MG capsule, Take 40 mg by mouth daily., Disp: , Rfl:  .  ondansetron (ZOFRAN ODT) 4 MG disintegrating tablet, Take 1-2 tablets three times daily as needed, Disp: 42 tablet, Rfl: 1 .  predniSONE (DELTASONE) 10 MG tablet, Take 1 tablet (10 mg total) by mouth daily with breakfast., Disp: 60 tablet, Rfl: 5 .  SPIRIVA RESPIMAT 2.5 MCG/ACT AERS, INHALE 2 PUFFS INTO THE LUNGS DAILY, Disp: 4 g, Rfl: 3 .  sulfamethoxazole-trimethoprim (BACTRIM DS,SEPTRA DS) 800-160 MG tablet, Take 1tablet Monday, 1tablet Wednesday, 1tablet Friday, Disp: 60 tablet, Rfl: 5 .  VITAMIN B1-B12 IM, Inject 1,000 mcg into the muscle every 30 (thirty) days. , Disp: , Rfl:  .  VITAMIN E PO,  Take 1 tablet by mouth daily. , Disp: , Rfl:  .  XARELTO 20 MG TABS tablet, TAKE 1 TABLET BY MOUTH ONCE DAILY WITH FOOD, Disp: 90 tablet, Rfl: 2 .  gabapentin (NEURONTIN) 100 MG capsule, Take 1 capsule (100 mg total) by mouth 3 (three) times daily. (Patient not taking: Reported on 02/01/2018), Disp: 60 capsule, Rfl: 0 .  HYDROcodone-acetaminophen (NORCO) 5-325 MG tablet, Take 1 tablet by mouth every 6 (six) hours as needed. (Patient not taking: Reported on 02/01/2018), Disp: 10 tablet, Rfl: 0 .  methocarbamol (ROBAXIN) 750 MG tablet, Take 1 tablet (750 mg total) by mouth at bedtime as needed for muscle spasms. (Patient not taking: Reported on 02/01/2018), Disp: 30 tablet, Rfl: 2   Review of Systems     Objective:   Physical Exam  Constitutional: She is oriented to person, place, and time. She appears well-developed and well-nourished. No distress.  HENT:  Head: Normocephalic and atraumatic.  Right Ear: External ear normal.  Left Ear: External ear normal.  Mouth/Throat: Oropharynx is clear and moist. No oropharyngeal exudate.  Eyes: Pupils are equal, round, and reactive to light. Conjunctivae and EOM are normal. Right eye exhibits no discharge. Left eye exhibits no discharge. No scleral icterus.  Neck: Normal range of motion. Neck supple. No JVD present. No tracheal deviation present. No thyromegaly present.  Cardiovascular: Normal rate, regular rhythm, normal heart sounds and intact distal pulses. Exam  reveals no gallop and no friction rub.  No murmur heard. Pulmonary/Chest: Effort normal and breath sounds normal. No respiratory distress. She has no wheezes. She has no rales. She exhibits no tenderness.  Abdominal: Soft. Bowel sounds are normal. She exhibits no distension and no mass. There is no tenderness. There is no rebound and no guarding.  Musculoskeletal: Normal range of motion. She exhibits no edema or tenderness.  Lymphadenopathy:    She has no cervical adenopathy.  Neurological: She is  alert and oriented to person, place, and time. She has normal reflexes. No cranial nerve deficit. She exhibits normal muscle tone. Coordination normal.  Skin: Skin is warm and dry. No rash noted. She is not diaphoretic. No erythema. No pallor.  Erythematous hands  Psychiatric: She has a normal mood and affect. Her behavior is normal. Judgment and thought content normal.  Vitals reviewed.  Vitals:   02/01/18 1330  BP: 120/70  Pulse: 80  SpO2: 99%    Estimated body mass index is 24.93 kg/m as calculated from the following:   Height as of 12/16/17: 5' 3.5" (1.613 m).   Weight as of 12/16/17: 143 lb (64.9 kg).     Assessment:       ICD-10-CM   1. ILD (interstitial lung disease) ([place]) J84.9 CBC    Basic Metabolic Panel (BMET)    Hepatic function panel    Pulmonary function test  2. Scl-70 antibody positive R76.8 CBC    Basic Metabolic Panel (BMET)    Hepatic function panel    Pulmonary function test  3. [name] phenomenon without gangrene I73.00 CBC    Basic Metabolic Panel (BMET)    Hepatic function panel    Pulmonary function test  4. Therapeutic drug monitoring Z51.81   5. High risk medication use Z79.899   6. Pulmonary emphysema with fibrosis of lung ([place]) J43.9    J84.10        Plan:     ILD (interstitial lung disease) (HCC) Scl-70 antibody positive [name] phenomenon without gangrene  - stable disease based on sy,mtpms and breathing test - in 6 months do Pre-bd spiro and dlco only. No lung volume or bd response. No post-bd spiro   Therapeutic drug monitoring High risk med use  - tolerating well - heck cbc, bmet, lft  - continue myfortic 360mg  twice daily, prednisone 10mg  daily and bactrim prophylaxis as before  - flu shot in fall  - too bad you got shingles despite zoster vaccine in 2012 - Please talk to PCP Chesley Noon, MD -  and discuss if you still should get  shingarix vaccine or not   Pulmonary emphysema with fibrosis of lung ([place]) -  continue spiriva   Followup - 6 months to ILD clinic but aftter PFT    Dr. Brand Males, M.D., [facility].C.P Pulmonary and Critical Care Medicine Staff Physician, [place] Director - Interstitial Lung Disease  Program  P[facility] [place], [place], [address]  Pager: [phone], If no answer or between  15:00h - 7:00h: call [phone] Telephone: [phone]

## 2018-02-01 NOTE — Progress Notes (Signed)
Spirometry and Dlco done today. 

## 2018-02-01 NOTE — Patient Instructions (Addendum)
ILD (interstitial lung disease) (HCC) Scl-70 antibody positive [name] phenomenon without gangrene  - stable disease based on sy,mtpms and breathing test - in 6 months do Pre-bd spiro and dlco only. No lung volume or bd response. No post-bd spiro   Therapeutic drug monitoring High risk med use  - tolerating well - check cbc, bmet, lft  - continue myfortic 360mg  twice daily, prednisone 10mg  daily and bactrim prophylaxis as before  - flu shot in fall  - too bad you got shingles despite zoster vaccine in 2012 - Please talk to PCP Chesley Noon, MD -  and discuss if you still should get  shingarix vaccine or not   Pulmonary emphysema with fibrosis of lung ([place]) - continue spiriva   Followup - 6 months to ILD clinic but aftter PFT

## 2018-02-21 ENCOUNTER — Other Ambulatory Visit: Payer: Self-pay | Admitting: Internal Medicine

## 2018-03-01 ENCOUNTER — Other Ambulatory Visit: Payer: Self-pay | Admitting: Internal Medicine

## 2018-03-28 ENCOUNTER — Other Ambulatory Visit: Payer: Self-pay | Admitting: Internal Medicine

## 2018-03-29 ENCOUNTER — Other Ambulatory Visit: Payer: Self-pay | Admitting: Oncology

## 2018-05-16 ENCOUNTER — Telehealth: Payer: Self-pay

## 2018-05-16 NOTE — Telephone Encounter (Signed)
   [facility] Pre-operative Risk Assessment    Request for surgical clearance:  1. What type of surgery is being performed?  Colonoscopy/EGD   2. When is this surgery scheduled?  05/31/18   3. What type of clearance is required (medical clearance vs. Pharmacy clearance to hold med vs. Both)?  BOTH  4. Are there any medications that need to be held prior to surgery and how long? Xarelto   5. Practice name and name of physician performing surgery?  [facility]/ Dr Earlean Shawl   6. What is your office phone number [phone]    7.   What is your office fax number [phone]  8.   Anesthesia type (None, local, MAC, general) ?  Gwyneth Sprout  Yeni Jiggetts 05/16/2018, 1:40 PM  _________________________________________________________________   (provider comments below)

## 2018-05-17 NOTE — Telephone Encounter (Signed)
Will ask pharmacy to comment on Xarelto and then contact patient for clearance.  Kerin Ransom PA-C 05/17/2018 4:28 PM

## 2018-05-18 NOTE — Telephone Encounter (Signed)
Patient with diagnosis of atrial fibrillation on Xarelto for anticoagulation.    Procedure: colonoscopy/EGD Date of procedure: 05/31/18  CHADS2-VASc score of  3 (, AGE,, AGE, female)  CrCl 55.8 Platelet count 250  Per office protocol, patient can hold Xarelto for 1 days prior to procedure.    Patient should restart Xarelto on the evening of procedure or day after, at discretion of procedure MD

## 2018-05-18 NOTE — Telephone Encounter (Signed)
   Primary Cardiologist: Virl Axe, MD  Chart reviewed as part of pre-operative protocol coverage. Patient was contacted 05/18/2018 in reference to pre-operative risk assessment for pending surgery as outlined below.  [name] was last seen on 07/26/2017 by Dr. Caryl Comes.  Since that day, [name] has done well with no new cardiac complaints.  Therefore, based on ACC/AHA guidelines, the patient would be at acceptable risk for the planned procedure without further cardiovascular testing.   Anticoagulation: Patient with diagnosis of atrial fibrillation on Xarelto for anticoagulation.   Procedure: colonoscopy/EGD Date of procedure: 05/31/18 CHADS2-VASc score of  3 (, AGE,, AGE, female) CrCl 55.8 Platelet count 250  Per office protocol, patient can hold Xarelto for 1 days prior to procedure.   Patient should restart Xarelto on the evening of procedure or day after, at discretion of procedure MD  I will route this recommendation to the requesting party via [facility] fax function and remove from pre-op pool.  Please call with questions.  Daune Perch, NP 05/18/2018, 2:25 PM

## 2018-05-22 ENCOUNTER — Other Ambulatory Visit: Payer: Self-pay | Admitting: Internal Medicine

## 2018-05-22 NOTE — Progress Notes (Signed)
Office Visit Note  Patient: [name]             Date of Birth: 10/31/1940           MRN: [id number]             PCP: Chesley Noon, MD Referring: Chesley Noon, MD Visit Date: 06/04/2018 Occupation: @GUAROCC @  Subjective:  [name] .   History of Present Illness: [name] is a 77 y.o. female with history of raynauds phenomenon, interstitial lung disease and positive SCL 70.  Patient states her Raynauds is worse during the winter months.  She does not have any digital ulcers.  She denies any joint swelling.  She has been having some discomfort in her hands off and on in her left knee joint.  Patient states that she has been taking has Myfortic on a regular basis.  She denies any increased shortness of breath.  Activities of Daily Living:  Patient reports morning stiffness for 5 minutes.   Patient Denies nocturnal pain.  Difficulty dressing/grooming: Denies Difficulty climbing stairs: Denies Difficulty getting out of chair: Denies Difficulty using hands for taps, buttons, cutlery, and/or writing: Denies  Review of Systems  Constitutional: Positive for fatigue. Negative for night sweats, weight gain and weight loss.  HENT: Positive for mouth sores. Negative for trouble swallowing, trouble swallowing, mouth dryness and nose dryness.   Eyes: Negative for pain, redness, visual disturbance and dryness.  Respiratory: Negative for cough, shortness of breath and difficulty breathing.   Cardiovascular: Positive for palpitations. Negative for chest pain, hypertension, irregular heartbeat and swelling in legs/feet.  Gastrointestinal: Negative for blood in stool, constipation and diarrhea.  Endocrine: Negative for increased urination.  Genitourinary: Negative for vaginal dryness.  Musculoskeletal: Negative for arthralgias, joint pain, joint swelling, myalgias, muscle weakness, morning stiffness, muscle tenderness and myalgias.  Skin: Positive for color change.  Negative for rash, hair loss, skin tightness, ulcers and sensitivity to sunlight.  Allergic/Immunologic: Negative for susceptible to infections.  Neurological: Negative for dizziness, memory loss, night sweats and weakness.  Hematological: Negative for swollen glands.  Psychiatric/Behavioral: Negative for depressed mood and sleep disturbance. The patient is not nervous/anxious.     PMFS History:  Patient Active Problem List   Diagnosis Date Noted  . ILD (interstitial lung disease) ([place]) 11/28/2016  . Multiple lung nodules on CT 11/28/2016  . Abnormal laboratory test 11/18/2016  . DDD L spine 11/18/2016  . 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 History of hypothyroidism 11/10/2016  . History of hemochromatosis 11/10/2016  . Pulmonary emphysema ([place]) 08/31/2016  . [name] phenomenon without gangrene 08/31/2016  . Sinusitis, chronic 08/31/2016  . Chronic cough 02/10/2016  . Irritable larynx 02/10/2016  . Stopped smoking with greater than 40 pack year history 02/10/2016  . Chest pain, unspecified 10/06/2010  . Shortness of breath 10/06/2010  . Hypothyroidism   . Hereditary hemochromatosis ([place]) 04/22/2010  . ATRIAL FIBRILLATION 10/20/2008  . [facility] 10/20/2008  . SYNCOPE AND COLLAPSE 10/20/2008    Past Medical History:  Diagnosis Date  . Atrial fibrillation ([place])    a. Flecainide therapy;  b. event monitor 4/12  . Chest pain    a. GXT myoview 4/12: no isch., EF 86%;   b. echo 4/12: EF 55-65%, grade 1 diast dysfxn, LAE  . Depression with anxiety   . GERD (gastroesophageal reflux disease)   . 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 Beryle Beams  . History of syncope   .  Hypothyroidism     Family History  Problem Relation Age of Onset  . Stroke Mother   . Heart disease Father 45  . Hemochromatosis Brother    Past Surgical History:  Procedure Laterality Date  . MOUTH SURGERY    . SHOULDER ARTHROSCOPY Left   . TOTAL ABDOMINAL HYSTERECTOMY    .  VARICOSE VEIN SURGERY     Social History   Social History Narrative   REGULAR EXERCISE    Objective: Vital Signs: BP 129/71 (BP Location: Left Arm, Patient Position: Sitting, Cuff Size: Normal)   Pulse 66   Resp 12   Ht 5' 3.5" (1.613 m)   Wt 151 lb (68.5 kg)   BMI 26.33 kg/m    Physical Exam  Constitutional: She is oriented to person, place, and time. She appears well-developed and well-nourished.  HENT:  Head: Normocephalic and atraumatic.  Eyes: Conjunctivae and EOM are normal.  Neck: Normal range of motion.  Cardiovascular: Normal rate, regular rhythm, normal heart sounds and intact distal pulses.  Pulmonary/Chest: Effort normal and breath sounds normal.  Abdominal: Soft. Bowel sounds are normal.  Lymphadenopathy:    She has no cervical adenopathy.  Neurological: She is alert and oriented to person, place, and time.  Skin: Skin is warm and dry. Capillary refill takes 2 to 3 seconds.  Psychiatric: She has a normal mood and affect. Her behavior is normal.  Nursing note and vitals reviewed.    Musculoskeletal Exam: C-spine and thoracic spine with good range of motion.  She has some discomfort range of motion of her lumbar spine.  Shoulder joints elbow joints wrist joints with good range of motion.  She has DIP thickening in her bilateral hands with a mucinous cyst present over her right second DIP.  An indentation in the nail is also noted be due to  mucin cyst.  Hip joints, knee joints, ankles, MTPs PIPs been good range of motion.  She has a Baker's cyst present in her left knee with slight warmth.  CDAI Exam: CDAI Score: Not documented Patient Global Assessment: Not documented; Provider Global Assessment: Not documented Swollen: Not documented; Tender: Not documented Joint Exam   Not documented   There is currently no information documented on the homunculus. Go to the Rheumatology activity and complete the homunculus joint exam.  Investigation: No additional  findings.  Imaging: No results found.  Recent Labs: Lab Results  Component Value Date   WBC 7.2 02/01/2018   HGB 11.9 (L) 02/01/2018   PLT 250.0 02/01/2018   NA 139 02/01/2018   K 3.8 02/01/2018   CL 103 02/01/2018   CO2 28 02/01/2018   GLUCOSE 167 (H) 02/01/2018   BUN 12 02/01/2018   CREATININE 0.86 02/01/2018   BILITOT 0.6 02/01/2018   ALKPHOS 40 02/01/2018   AST 12 02/01/2018   ALT 10 02/01/2018   PROT 6.9 02/01/2018   ALBUMIN 4.2 02/01/2018   CALCIUM 9.5 02/01/2018   GFRAA >60 09/11/2017   QFTBGOLDPLUS NEGATIVE 08/10/2017    Speciality Comments: No specialty comments available.  Procedures:  No procedures performed Allergies: Patient has no known allergies.   Assessment / Plan:     Visit Diagnoses: [name] phenomenon without gangrene-patient has mildly decreased capillary refill.  She states she does have increased problems with raynauds phenomenon during the wintertime.  She had noticed ulcers on examination.  Protective clothing and keeping body temperature warm was discussed.  I do not see any sclerodactyly or nailbed capillary changes at this time.  Abnormal laboratory test - Positive anticardiolipin IgM 47, positive beta-2 IgM 31: The labs done last year were stable.  ILD (interstitial lung disease) ([place]) - Scl-70+.  She has been followed by Dr. Chase Caller.  Patient is on Myfortic and prednisone 10 mg p.o. daily.  Her labs are followed by Dr. Chase Caller which are stable.  High risk medication use -  On Myfortic and prednisone 10 mg p.o. daily for her lung disease.  Multiple lung nodules on CT  History of emphysema - Followed by Dr. Chase Caller   DDD (degenerative disc disease), lumbar-she is currently not having much discomfort.  Primary osteoarthritis of both hands-she is osteoarthritis in her hands and use NSAIDs.  Synovial cyst of left popliteal space-it is not very bothersome.  I am hesitant to aspirated that she is on anticoagulants.  History of  hemochromatosis - Followed up by hematology yearly.    History of hypothyroidism  History of atrial fibrillation - Followed by Dr. Caryl Comes.   Former smoker - with greater than 40 pack year history    Orders: No orders of the defined types were placed in this encounter.  No orders of the defined types were placed in this encounter.     Follow-Up Instructions: Return in about 6 months (around 12/04/2018) for Raynauds, ILD.   Bo Merino, MD  Note - This record has been created using Editor, commissioning.  Chart creation errors have been sought, but may not always  have been located. Such creation errors do not reflect on  the standard of medical care.

## 2018-06-04 ENCOUNTER — Encounter: Payer: Self-pay | Admitting: Rheumatology

## 2018-06-04 ENCOUNTER — Ambulatory Visit (INDEPENDENT_AMBULATORY_CARE_PROVIDER_SITE_OTHER): Payer: Medicare Other | Admitting: Rheumatology

## 2018-06-04 ENCOUNTER — Other Ambulatory Visit: Payer: Self-pay | Admitting: Pharmacist

## 2018-06-04 VITALS — BP 129/71 | HR 66 | Resp 12 | Ht 63.5 in | Wt 151.0 lb

## 2018-06-04 DIAGNOSIS — Z79899 Other long term (current) drug therapy: Secondary | ICD-10-CM

## 2018-06-04 DIAGNOSIS — J849 Interstitial pulmonary disease, unspecified: Secondary | ICD-10-CM | POA: Diagnosis not present

## 2018-06-04 DIAGNOSIS — I7300 Raynaud's syndrome without gangrene: Secondary | ICD-10-CM | POA: Diagnosis not present

## 2018-06-04 DIAGNOSIS — Z8709 Personal history of other diseases of the respiratory system: Secondary | ICD-10-CM

## 2018-06-04 DIAGNOSIS — Z8679 Personal history of other diseases of the circulatory system: Secondary | ICD-10-CM

## 2018-06-04 DIAGNOSIS — M19041 Primary osteoarthritis, right hand: Secondary | ICD-10-CM

## 2018-06-04 DIAGNOSIS — M5136 Other intervertebral disc degeneration, lumbar region: Secondary | ICD-10-CM

## 2018-06-04 DIAGNOSIS — R899 Unspecified abnormal finding in specimens from other organs, systems and tissues: Secondary | ICD-10-CM

## 2018-06-04 DIAGNOSIS — M7122 Synovial cyst of popliteal space [Baker], left knee: Secondary | ICD-10-CM

## 2018-06-04 DIAGNOSIS — J439 Emphysema, unspecified: Secondary | ICD-10-CM

## 2018-06-04 DIAGNOSIS — R918 Other nonspecific abnormal finding of lung field: Secondary | ICD-10-CM

## 2018-06-04 DIAGNOSIS — Z8639 Personal history of other endocrine, nutritional and metabolic disease: Secondary | ICD-10-CM

## 2018-06-04 DIAGNOSIS — M51369 Other intervertebral disc degeneration, lumbar region without mention of lumbar back pain or lower extremity pain: Secondary | ICD-10-CM

## 2018-06-04 DIAGNOSIS — M19042 Primary osteoarthritis, left hand: Secondary | ICD-10-CM

## 2018-06-04 DIAGNOSIS — Z87891 Personal history of nicotine dependence: Secondary | ICD-10-CM

## 2018-07-10 ENCOUNTER — Encounter (HOSPITAL_COMMUNITY): Payer: Self-pay | Admitting: Emergency Medicine

## 2018-07-10 ENCOUNTER — Emergency Department (HOSPITAL_COMMUNITY): Payer: Medicare Other

## 2018-07-10 ENCOUNTER — Emergency Department (HOSPITAL_COMMUNITY)
Admission: EM | Admit: 2018-07-10 | Discharge: 2018-07-10 | Disposition: A | Discharge status: Home / Self Care | Payer: Medicare Other | Attending: Emergency Medicine | Admitting: Emergency Medicine

## 2018-07-10 DIAGNOSIS — Z7901 Long term (current) use of anticoagulants: Secondary | ICD-10-CM | POA: Insufficient documentation

## 2018-07-10 DIAGNOSIS — I4891 Unspecified atrial fibrillation: Secondary | ICD-10-CM | POA: Diagnosis not present

## 2018-07-10 DIAGNOSIS — Z79899 Other long term (current) drug therapy: Secondary | ICD-10-CM | POA: Insufficient documentation

## 2018-07-10 DIAGNOSIS — Z87891 Personal history of nicotine dependence: Secondary | ICD-10-CM | POA: Diagnosis not present

## 2018-07-10 DIAGNOSIS — E039 Hypothyroidism, unspecified: Secondary | ICD-10-CM | POA: Insufficient documentation

## 2018-07-10 DIAGNOSIS — R079 Chest pain, unspecified: Secondary | ICD-10-CM | POA: Diagnosis present

## 2018-07-10 LAB — I-STAT TROPONIN, ED: Troponin i, poc: 0.01 ng/mL (ref 0.00–0.08)

## 2018-07-10 LAB — BASIC METABOLIC PANEL
Anion gap: 10 (ref 5–15)
BUN: 12 mg/dL (ref 8–23)
CO2: 23 mmol/L (ref 22–32)
Calcium: 8.9 mg/dL (ref 8.9–10.3)
Chloride: 106 mmol/L (ref 98–111)
Creatinine, Ser: 0.78 mg/dL (ref 0.44–1.00)
GFR calc Af Amer: >60 mL/min (ref >60)
GFR calc non Af Amer: >60 mL/min (ref >60)
Glucose, Bld: 110 mg/dL — ABNORMAL HIGH (ref 70–99)
Potassium: 3.9 mmol/L (ref 3.5–5.1)
Sodium: 139 mmol/L (ref 135–145)

## 2018-07-10 LAB — CBC
HCT: 35.9 % — ABNORMAL LOW (ref 36.0–46.0)
Hemoglobin: 11.4 g/dL — ABNORMAL LOW (ref 12.0–15.0)
MCH: 32.2 pg (ref 26.0–34.0)
MCHC: 31.8 g/dL (ref 30.0–36.0)
MCV: 101.4 fL — ABNORMAL HIGH (ref 80.0–100.0)
Platelets: 310 10*3/uL (ref 150–400)
RBC: 3.54 MIL/uL — ABNORMAL LOW (ref 3.87–5.11)
RDW: 13.4 % (ref 11.5–15.5)
WBC: 8 10*3/uL (ref 4.0–10.5)
nRBC: 0 % (ref 0.0–0.2)

## 2018-07-10 LAB — PROTIME-INR
INR: 1.25
Prothrombin Time: 15.6 seconds — ABNORMAL HIGH (ref 11.4–15.2)

## 2018-07-10 MED ORDER — ETOMIDATE 2 MG/ML IV SOLN
INTRAVENOUS | Status: AC | PRN
  Administered 2018-07-10: 7 mg via INTRAVENOUS

## 2018-07-10 MED ORDER — ETOMIDATE 2 MG/ML IV SOLN
7.0000 mg | Freq: Once | INTRAVENOUS | Status: DC
  Filled 2018-07-10: qty 10

## 2018-07-10 MED ORDER — FENTANYL CITRATE (PF) 100 MCG/2ML IJ SOLN
50.0000 ug | Freq: Once | INTRAMUSCULAR | Status: DC
  Filled 2018-07-10: qty 2

## 2018-07-10 MED ORDER — SODIUM CHLORIDE 0.9 % IV SOLN
INTRAVENOUS | Status: DC
  Administered 2018-07-10: 06:00:00 via INTRAVENOUS

## 2018-07-10 MED ORDER — ONDANSETRON HCL 4 MG/2ML IJ SOLN
4.0000 mg | Freq: Once | INTRAMUSCULAR | Status: DC
  Filled 2018-07-10: qty 2

## 2018-07-10 MED ORDER — FENTANYL CITRATE (PF) 100 MCG/2ML IJ SOLN
INTRAMUSCULAR | Status: AC | PRN
  Administered 2018-07-10: 50 ug via INTRAVENOUS

## 2018-07-10 MED ORDER — ONDANSETRON HCL 4 MG/2ML IJ SOLN
INTRAMUSCULAR | Status: AC | PRN
  Administered 2018-07-10: 4 mg via INTRAVENOUS

## 2018-07-10 NOTE — ED Provider Notes (Addendum)
TIME SEEN: 4:59 AM  CHIEF COMPLAINT: Chest pain, atrial fibrillation  HPI: Patient is a 78 year old female with history of atrial fibrillation on flecainide, Toprol and Xarelto, hypothyroidism who presents to the emergency department with complaints of palpitations, chest pain that woke her from sleep at 1:25 AM.  Felt like her heart was racing and felt like she had left-sided chest tightness that radiated up into her jaw and down her left arm.  States she felt short of breath but no nausea, dizziness or diaphoresis.  Has had similar symptoms with her atrial fibrillation previously and normally converts on her own.  States she waited several hours and symptoms did not improve so she called EMS.  Received 15 mg of metoprolol with EMS without improvement.  She reports compliance with all of her medications including her Xarelto.  No previous history of electric cardioversion or ablation.  ROS: See HPI Constitutional: no fever  Eyes: no drainage  ENT: no runny nose   Cardiovascular:   chest pain  Resp: no SOB  GI: no vomiting GU: no dysuria Integumentary: no rash  Allergy: no hives  Musculoskeletal: no leg swelling  Neurological: no slurred speech ROS otherwise negative  PAST MEDICAL HISTORY/PAST SURGICAL HISTORY:  Past Medical History:  Diagnosis Date  . Atrial fibrillation ([place])    a. Flecainide therapy;  b. event monitor 4/12  . Chest pain    a. GXT myoview 4/12: no isch., EF 86%;   b. echo 4/12: EF 55-65%, grade 1 diast dysfxn, LAE  . Depression with anxiety   . GERD (gastroesophageal reflux disease)   . 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 Beryle Beams  . History of syncope   . Hypothyroidism     MEDICATIONS:  Prior to Admission medications   Medication Sig Start Date End Date Taking? Authorizing Provider  Biotin 1000 MCG tablet Take 1,000 mcg by mouth daily.    [provider]  Calcium Carbonate 1500 MG TABS Take 1 tablet by mouth 3 (three)  times a week.    [provider]  Cholecalciferol (VITAMIN D PO) Take 5,000 Units by mouth daily.     [provider]  flecainide (TAMBOCOR) 100 MG tablet TAKE ONE-HALF TABLET BY  MOUTH IN THE MORNING AND  ONE-HALF TABLET IN THE  EVENING 02/21/18   Deboraha Sprang, MD  fluticasone [facility]) 50 MCG/ACT nasal spray Place 2 sprays into both nostrils daily. Patient taking differently: Place 2 sprays into both nostrils as needed.  08/31/16   Brand Males, MD  folic acid (FOLVITE) 1 MG tablet Take 1 mg by mouth daily.      [provider]  gabapentin (NEURONTIN) 100 MG capsule Take 1 capsule (100 mg total) by mouth 3 (three) times daily. Patient taking differently: Take 100 mg by mouth as needed.  12/16/17   Sherwood Gambler, MD  HYDROcodone-acetaminophen (NORCO) 5-325 MG tablet Take 1 tablet by mouth every 6 (six) hours as needed. Patient not taking: Reported on 02/01/2018 12/16/17   Sherwood Gambler, MD  levothyroxine (SYNTHROID, LEVOTHROID) 112 MCG tablet Take 112 mcg by mouth daily.      [provider]  methocarbamol (ROBAXIN) 750 MG tablet Take 1 tablet (750 mg total) by mouth at bedtime as needed for muscle spasms. 04/12/17   Bo Merino, MD  metoprolol succinate (TOPROL-XL) 25 MG 24 hr tablet TAKE 1 TABLET BY MOUTH  DAILY 05/29/17   Deboraha Sprang, MD  mycophenolate (MYFORTIC) 360 MG TBEC EC  tablet TAKE 1 TABLET BY MOUTH TWICE DAILY 05/23/18   Brand Males, MD  omeprazole (PRILOSEC) 40 MG capsule Take 40 mg by mouth daily.    [provider]  ondansetron (ZOFRAN ODT) 4 MG disintegrating tablet Take 1-2 tablets three times daily as needed 09/22/17   Brand Males, MD  predniSONE (DELTASONE) 10 MG tablet Take 1 tablet (10 mg total) by mouth daily with breakfast. 08/16/17   Brand Males, MD  SPIRIVA RESPIMAT 2.5 MCG/ACT AERS INHALE 2 PUFFS INTO THE LUNGS DAILY 03/28/18   Brand Males, MD  sulfamethoxazole-trimethoprim (BACTRIM DS,SEPTRA  DS) 800-160 MG tablet Take 1tablet Monday, 1tablet Wednesday, 1tablet Friday 08/16/17   Brand Males, MD  VITAMIN B1-B12 IM Inject 1,000 mcg into the muscle every 30 (thirty) days.     [provider]  VITAMIN E PO Take 1 tablet by mouth daily.     [provider]  XARELTO 20 MG TABS tablet TAKE 1 TABLET BY MOUTH ONCE DAILY WITH FOOD 09/28/17   Deboraha Sprang, MD    ALLERGIES:  No Known Allergies  SOCIAL HISTORY:  Social History   Tobacco Use  . Smoking status: Former Smoker    Packs/day: 1.00    Years: 42.00    Pack years: 42.00    Last attempt to quit: 06/27/1997    Years since quitting: 21.0  . Smokeless tobacco: Never Used  Substance Use Topics  . Alcohol use: Yes    Comment: occ    FAMILY HISTORY: Family History  Problem Relation Age of Onset  . Stroke Mother   . Heart disease Father 11  . Hemochromatosis Brother     EXAM: BP 122/87 (BP Location: Left Arm)   Pulse (!) 163   Resp (!) 22   SpO2 99%  CONSTITUTIONAL: Alert and oriented and responds appropriately to questions. Well-appearing; well-nourished, in no distress HEAD: Normocephalic EYES: Conjunctivae clear, pupils appear equal, EOMI ENT: normal nose; moist mucous membranes NECK: Supple, no meningismus, no nuchal rigidity, no LAD  CARD: Irregularly irregular and tachycardic; S1 and S2 appreciated; no murmurs, no clicks, no rubs, no gallops RESP: Normal chest excursion without splinting or tachypnea; breath sounds clear and equal bilaterally; no wheezes, no rhonchi, no rales, no hypoxia or respiratory distress, speaking full sentences ABD/GI: Normal bowel sounds; non-distended; soft, non-tender, no rebound, no guarding, no peritoneal signs, no hepatosplenomegaly BACK:  The back appears normal and is non-tender to palpation, there is no CVA tenderness EXT: Normal ROM in all joints; non-tender to palpation; no edema; normal capillary refill; no cyanosis, no calf tenderness or swelling    SKIN:  Normal color for age and race; warm; no rash NEURO: Moves all extremities equally PSYCH: The patient's mood and manner are appropriate. Grooming and personal hygiene are appropriate.  MEDICAL DECISION MAKING: Patient here in A. fib with RVR.  We have discussed risk and benefits of medical versus electric cardioversion.  Given she has been compliant with her Xarelto over the past 30 days and risk of stroke would be low, patient and husband agreed to electric cardioversion for symptom management.  EKG shows A. fib with RVR today without other ischemic abnormality.  She is otherwise hemodynamically stable.  Risk and benefits of sedation and cardioversion discussed with patient and husband.  Will give fentanyl, Zofran, etomidate.  ED PROGRESS: Labs here are reassuring.  Chest x-ray shows possible right upper lobe pneumonia but she has no symptoms.  No fever, cough, leukocytosis.  Discussed these findings with patient  and recommended close follow-up with her PCP.  She agrees and agrees that she does not want antibiotics at this time.  Recommended close follow-up with cardiology as an outpatient.  Patient cardioverted successfully and back in normal sinus rhythm.  Now asymptomatic.   7:00 AM  Patient able to eat and drink, ambulate.  She states she is feeling great and ready for discharge home.  Will discharge home with her husband with close outpatient follow-up.  Chest pain-free at this time.   At this time, I do not feel there is any life-threatening condition present. I have reviewed and discussed all results (EKG, imaging, lab, urine as appropriate) and exam findings with patient/family. I have reviewed nursing notes and appropriate previous records.  I feel the patient is safe to be discharged home without further emergent workup and can continue workup as an outpatient as needed. Discussed usual and customary return precautions. Patient/family verbalize understanding and are comfortable with this plan.   Outpatient follow-up has been provided as needed. All questions have been answered.     EKG Interpretation  Date/Time:  Tuesday July 10 2018 04:52:44 EST Ventricular Rate:  141 PR Interval:    QRS Duration: 78 QT Interval:  290 QTC Calculation: 445 R Axis:   7 Text Interpretation:  Atrial fibrillation with rapid V-rate Low voltage, precordial leads Abnormal R-wave progression, early transition Minimal ST depression, inferior leads Patient now in a fib compared to normal sinus rhythm in December 2017 Confirmed by Pryor Curia [phone]) on 07/10/2018 4:59:45 AM       EKG Interpretation  Date/Time:  Tuesday July 10 2018 06:33:54 EST Ventricular Rate:  74 PR Interval:    QRS Duration: 99 QT Interval:  371 QTC Calculation: 412 R Axis:   -10 Text Interpretation:  Sinus rhythm Atrial premature complex Low voltage, precordial leads RSR' in V1 or V2, right VCD or RVH Borderline T abnormalities, anterior leads A fib has resolved compared to prior Confirmed by Pryor Curia [phone]) on 07/10/2018 6:38:12 AM         CRITICAL CARE Performed by: Cyril Mourning Ward   Total critical care time: 65 minutes  Critical care time was exclusive of separately billable procedures and treating other patients.  Critical care was necessary to treat or prevent imminent or life-threatening deterioration.  Critical care was time spent personally by me on the following activities: development of treatment plan with patient and/or surrogate as well as nursing, discussions with consultants, evaluation of patient's response to treatment, examination of patient, obtaining history from patient or surrogate, ordering and performing treatments and interventions, ordering and review of laboratory studies, ordering and review of radiographic studies, pulse oximetry and re-evaluation of patient's condition.     .Cardioversion Date/Time: 07/10/2018 6:39 AM Performed by: Ward, Delice Bison, DO Authorized by: Ward,  Delice Bison, DO   Consent:    Consent obtained:  Written   Consent given by:  Patient   Risks discussed:  Cutaneous burn, death, induced arrhythmia and pain   Alternatives discussed:  Rate-control medication Universal protocol:    Procedure explained and questions answered to patient or proxy's satisfaction: yes     Relevant documents present and verified: yes     Test results available and properly labeled: yes     Imaging studies available: yes     Required blood products, implants, devices, and special equipment available: yes     Site/side marked: yes     Immediately prior to procedure a time out was called: yes  Patient identity confirmed:  Verbally with patient Pre-procedure details:    Cardioversion basis:  Elective   Rhythm:  Atrial fibrillation   Electrode placement:  Anterior-posterior Patient sedated: Yes. Refer to sedation procedure documentation for details of sedation.  Attempt one:    Cardioversion mode:  Synchronous   Waveform:  Biphasic   Shock (Joules):  120   Shock outcome:  Conversion to normal sinus rhythm Post-procedure details:    Patient status:  Awake   Patient tolerance of procedure:  Tolerated well, no immediate complications .Sedation Date/Time: 07/10/2018 6:40 AM Performed by: Ward, Delice Bison, DO Authorized by: Ward, Delice Bison, DO   Consent:    Consent obtained:  Written   Consent given by:  Patient   Risks discussed:  Allergic reaction, dysrhythmia, inadequate sedation, nausea, vomiting, respiratory compromise necessitating ventilatory assistance and intubation, prolonged sedation necessitating reversal and prolonged hypoxia resulting in organ damage   Alternatives discussed:  Analgesia without sedation Universal protocol:    Procedure explained and questions answered to patient or proxy's satisfaction: yes     Relevant documents present and verified: yes     Test results available and properly labeled: yes     Imaging studies available: yes      Required blood products, implants, devices, and special equipment available: yes     Site/side marked: yes     Immediately prior to procedure a time out was called: yes   Indications:    Procedure performed:  Cardioversion   Procedure necessitating sedation performed by:  Physician performing sedation Pre-sedation assessment:    Time since last food or drink:  6pm 07/09/2018   ASA classification: class 2 - patient with mild systemic disease     Neck mobility: normal     Mouth opening:  3 or more finger widths   Mallampati score:  II - soft palate, uvula, fauces visible   Pre-sedation assessments completed and reviewed: airway patency, cardiovascular function, hydration status, mental status, nausea/vomiting, pain level, respiratory function and temperature     Pre-sedation assessment completed:  07/10/2018 5:30 AM Immediate pre-procedure details:    Reassessment: Patient reassessed immediately prior to procedure     Reviewed: vital signs, relevant labs/tests and NPO status     Verified: bag valve mask available, emergency equipment available, intubation equipment available, IV patency confirmed, oxygen available, reversal medications available and suction available   Procedure details (see MAR for exact dosages):    Preoxygenation:  Nasal cannula   Sedation:  Etomidate   Analgesia:  Fentanyl   Intra-procedure monitoring:  Blood pressure monitoring, cardiac monitor, continuous capnometry, continuous pulse oximetry, frequent LOC assessments and frequent vital sign checks   Intra-procedure events: hypoxia and respiratory depression     Intra-procedure management:  BVM ventilation   Total Provider sedation time (minutes):  20 Post-procedure details:    Post-sedation assessment completed:  07/10/2018 6:41 AM   Attendance: Constant attendance by certified staff until patient recovered     Recovery: Patient returned to pre-procedure baseline     Post-sedation assessments completed and  reviewed: airway patency, cardiovascular function, hydration status, mental status, nausea/vomiting, pain level, respiratory function and temperature     Patient is stable for discharge or admission: yes     Patient tolerance:  Tolerated well, no immediate complications      Ward, Delice Bison, DO 07/10/18 0708  Chads vas 2 score is 3. Ward, Delice Bison, DO 07/13/18 2340    Ward, Delice Bison, DO 07/13/18 2340

## 2018-07-10 NOTE — ED Triage Notes (Addendum)
Patient arrived with EMS from home woke up this evening with central chest pain and palpitations ( Hx. of AFib ) , received Metropolol 15 mg IV and ASA 324 mg po by EMS , pain radiating to left jaw and left arm , her cardiologist is Dr. Caryl Comes .

## 2018-07-10 NOTE — ED Notes (Signed)
Patient signed consent form for cardioversion under sedation .

## 2018-07-10 NOTE — Discharge Instructions (Addendum)
You are seen in the emergency department for atrial fibrillation in a rapid rate.  Your labs today were normal.  We used electricity to cardiovert you back into a normal rhythm.  Please continue your metoprolol, flecainide and Xarelto as prescribed.  Please follow-up with your cardiologist as an outpatient.  If you begin having chest pain, shortness of breath, sweating, feel like you may pass out, rapid irregular heartbeat, please return to the emergency department immediately.  Your chest x-ray was concerning for possible right upper lobe pneumonia.  You have no fever, elevated white blood cell count or cough.  If you develop symptoms of fever, chills, cough, shortness of breath, you may need treatment for pneumonia.  Given you are not having symptoms of pneumonia today, we are not giving you antibiotics.  I recommend close follow-up with your primary care physician for this as they may need to repeat your chest x-ray in 2 to 3 weeks to ensure resolution.

## 2018-07-10 NOTE — ED Notes (Signed)
Patient ambulated hallway with minimal assistance . EDP explained plan of care to pt.

## 2018-07-10 NOTE — ED Notes (Signed)
Patient alert and oriented , respirations unlabored , IV site intact , family at bedside , HR= 76/min NSR , denies chest pain / no palpitations .

## 2018-07-10 NOTE — ED Notes (Signed)
Zoll pads applied to pt.'s chest , RT notified by secretary , IV site intact , respirations unlabored , HR= 150's-140's .

## 2018-07-10 NOTE — ED Notes (Signed)
Patient tolerated juice/apple juice , no nausea or emesis .

## 2018-07-11 ENCOUNTER — Telehealth: Payer: Self-pay | Admitting: Internal Medicine

## 2018-07-11 DIAGNOSIS — J189 Pneumonia, unspecified organism: Secondary | ICD-10-CM

## 2018-07-11 NOTE — Telephone Encounter (Signed)
Call made to patient, patient states she usually gets a yearly CT scan. She is requesting that we get her scheduled for her yearly scan. She was seen in the ED yesterday for A-fib and states she was told that she has a touch of PNE and to call her pulmonologist. I did inform the patient she would probably need to be seen sooner than her already scheduled appt.   MR please advise if you would like an order placed for CT scan , if so what DX? Also if you would like her seen sooner than 08/14/18.

## 2018-07-12 NOTE — Telephone Encounter (Signed)
Patient called asking to speak to nurse to clarify some information, CB is [phone].

## 2018-07-12 NOTE — Telephone Encounter (Signed)
Spoke with pt's husband, he states she is not doing too well and seems like she is dragging. He doesn't remember them refusing abx and stated "they did not offer them to [name]" He said she would have taken them. He states she already had a pneumonia shot. He wants too know should she be on ABX now? MR please advise.

## 2018-07-12 NOTE — Telephone Encounter (Signed)
MR, please advise if you want pt to be prescribed any abx to help with the pna or if you want her to just repeat the cxr next week and then go from there to see what needs to happen?

## 2018-07-12 NOTE — Telephone Encounter (Signed)
Noted.  Called and spoke with pt letting her know that MR said to repeat cxr 1 week and then we would go from there to see if any abx needed to be done. Stated to pt after the cxr, he would decide timing of CT. Pt expressed understanding. Order has been placed for cxr. Nothing further needed.

## 2018-07-12 NOTE — Telephone Encounter (Signed)
Spoke with the pt  She wanted to clarify that the hospital did not offer her any abx but she was not having any symptoms  She states she has felt slight increase in her SOB over the past several wks  She has felt some fatigue  She denies any CP or tightness, f/c/s, wheezing, increased cough or sputum production  Do you want her to take abx? And she is also asking when she can have the ct chest done  Please advise, thanks!

## 2018-07-12 NOTE — Telephone Encounter (Signed)
Visualized cxr from ER and also ED notes from 07/10/2018 - there eis RUL pneumonia. Why did she refuse antibiotics? Yes she needs CT but agree not now. Need to get pneumonia treated esp in setting of cellcept. Please find out how she is feeling now and why she refused antibiotics

## 2018-07-12 NOTE — Telephone Encounter (Signed)
Just repeat cxr next week and go from there

## 2018-07-12 NOTE — Telephone Encounter (Signed)
I agree on need to get  A follwup 1 year HRCT. I just worry that if I get a CT now and with that pneumonia I might end up ordering excess CT next few months. Maybe is best if she does another CXR 2 view this next week and we can decide on timing of CT and will reduce risk of multiple CT next few months

## 2018-07-17 ENCOUNTER — Ambulatory Visit (INDEPENDENT_AMBULATORY_CARE_PROVIDER_SITE_OTHER)
Admission: RE | Admit: 2018-07-17 | Discharge: 2018-07-17 | Disposition: A | Discharge status: Home / Self Care | Payer: Medicare Other | Source: Ambulatory Visit | Attending: Internal Medicine | Admitting: Internal Medicine

## 2018-07-17 ENCOUNTER — Telehealth: Payer: Self-pay | Admitting: Internal Medicine

## 2018-07-17 DIAGNOSIS — J189 Pneumonia, unspecified organism: Secondary | ICD-10-CM

## 2018-07-17 DIAGNOSIS — J849 Interstitial pulmonary disease, unspecified: Secondary | ICD-10-CM

## 2018-07-17 NOTE — Telephone Encounter (Signed)
Called and spoke with pt's husband [name] who stated pt was currently not at home. I stated to him based on the results of the cxr, MR was wanting pt to have a HRCT tomorrow so we can decide based on that if the colonoscopy and endoscopy procedures need to be cancelled. I stated to him that I already placed the order STAT so it could be done tomorrow and stated to him that our [facility] will call them to schedule the CT for pt. [name] expressed understanding. I stated to him that I would try to call pt on cell to see if I could reach her but stated to him that I wanted him to know about what was going on in case I could not reach pt.  Attempted to call pt but was unable to reach her. Left a detailed message on pt's machine with the information I left with pt's husband [name] that we were going to have her get HRCT tomorrow to see if procedures currently scheduled for Thursday need to be cancelled or not.   Dr. Earlean Shawl is pt's gastroenterologist and pt's procedures are elective procedures as it is time for them to be done again.

## 2018-07-17 NOTE — Telephone Encounter (Signed)
cxrt with persistent RUL consolidation  Plan HRCT 07/18/18 and we decide on colonoscopy/endoscopy  Likely that we might have to cancel colonoscopy esp if is elective - please find out indication for endoscopy/colonoscopy and name of MD doing it   Dg Chest 2 View  Result Date: 07/17/2018 CLINICAL DATA:  Shortness of breath for 4 weeks. EXAM: CHEST - 2 VIEW COMPARISON:  July 10, 2018, August 26, 2014 FINDINGS: The heart size and mediastinal contours are within normal limits. There is consolidation of the right upper lobe unchanged compared prior exam of July 10, 2018. The left lung is clear. There is no pleural effusion. The visualized skeletal structures are stable. IMPRESSION: Persistent right upper lobe pneumonia. Electronically Signed   By: Abelardo Diesel M.D.   On: 07/17/2018 13:52

## 2018-07-18 ENCOUNTER — Ambulatory Visit (INDEPENDENT_AMBULATORY_CARE_PROVIDER_SITE_OTHER): Payer: Medicare Other | Admitting: Internal Medicine

## 2018-07-18 ENCOUNTER — Encounter: Payer: Self-pay | Admitting: Internal Medicine

## 2018-07-18 ENCOUNTER — Ambulatory Visit (HOSPITAL_COMMUNITY)
Admission: RE | Admit: 2018-07-18 | Discharge: 2018-07-18 | Disposition: A | Discharge status: Home / Self Care | Payer: Medicare Other | Source: Ambulatory Visit | Attending: Internal Medicine | Admitting: Internal Medicine

## 2018-07-18 VITALS — BP 124/70 | HR 70 | Ht 63.5 in | Wt 146.4 lb

## 2018-07-18 DIAGNOSIS — J849 Interstitial pulmonary disease, unspecified: Secondary | ICD-10-CM | POA: Insufficient documentation

## 2018-07-18 DIAGNOSIS — I480 Paroxysmal atrial fibrillation: Secondary | ICD-10-CM

## 2018-07-18 DIAGNOSIS — I7300 Raynaud's syndrome without gangrene: Secondary | ICD-10-CM | POA: Diagnosis not present

## 2018-07-18 NOTE — Progress Notes (Signed)
Patient Care Team: ViaLennette Bihari, MD as PCP - General (Family Medicine) Deboraha Sprang, MD as PCP - Cardiology (Cardiology) Annia Belt, MD as Consulting Physician (Hematology) Deboraha Sprang, MD as Consulting Physician (Cardiology)   HPI  [name] is a 78 y.o. female seen in followup for atrial fibrillation for which he takes flecainide 50 mg twice daily. This occurs  in the context of hemochromatosis.  She is followed for this by Dr. Beryle Beams.            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   Normal nuclear study 4/15  Date Cr K Hgb  4/18 0.74  12.4  1/20 0.78 3.9 11.4    DATE PR interval QRSduration Dose  1/20 164 78 50   Seen in the ER 1/20 for atrial fibrillation with a rapid rate associated with chest pain and dyspnea.  Cardioverted and discharged.  Chest x-ray had demonstrated pulmonary infiltrate.  She has intercurrently been diagnosed with pulmonary fibrosis CT scan pending for today  Her CHADS-VASc score is 3; age/gender.  She takes Rivaroxaban without bleeding  Past Medical History:  Diagnosis Date  . Atrial fibrillation ([place])    a. Flecainide therapy;  b. event monitor 4/12  . Chest pain    a. GXT myoview 4/12: no isch., EF 86%;   b. echo 4/12: EF 55-65%, grade 1 diast dysfxn, LAE  . Depression with anxiety   . GERD (gastroesophageal reflux disease)   . 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 Beryle Beams  . History of syncope   . Hypothyroidism     Past Surgical History:  Procedure Laterality Date  . MOUTH SURGERY    . SHOULDER ARTHROSCOPY Left   . TOTAL ABDOMINAL HYSTERECTOMY    . VARICOSE VEIN SURGERY      Current Outpatient Medications  Medication Sig Dispense Refill  . Biotin 1000 MCG tablet Take  1,000 mcg by mouth daily.    . Calcium Carbonate 1500 MG TABS Take 1 tablet by mouth 3 (three) times a week.    . Cholecalciferol (VITAMIN D PO) Take 5,000 Units by mouth daily.     . flecainide (TAMBOCOR) 100 MG tablet TAKE ONE-HALF TABLET BY  MOUTH IN THE MORNING AND  ONE-HALF TABLET IN THE  EVENING 90 tablet 1  . fluticasone (FLONASE) 50 MCG/ACT nasal spray Place 2 sprays into both nostrils daily. 16 g 5  . folic acid (FOLVITE) 1 MG tablet Take 1 mg by mouth daily.      Marland Kitchen gabapentin (NEURONTIN) 100 MG capsule Take 1 capsule (100 mg total) by mouth 3 (three) times daily. 60 capsule 0  . HYDROcodone-acetaminophen (NORCO) 5-325 MG tablet Take 1 tablet by mouth every 6 (six) hours as needed. 10 tablet 0  . levothyroxine (SYNTHROID, LEVOTHROID) 112 MCG tablet Take 112 mcg by mouth daily.      . methocarbamol (ROBAXIN) 750 MG tablet Take 1 tablet (750 mg total) by mouth at bedtime as needed for muscle spasms. 30 tablet 2  . metoprolol succinate (TOPROL-XL) 25 MG 24 hr tablet TAKE 1 TABLET BY MOUTH  DAILY 90 tablet 2  . mycophenolate (MYFORTIC) 360 MG TBEC EC tablet TAKE 1 TABLET BY MOUTH TWICE DAILY 60 tablet 2  .  omeprazole (PRILOSEC) 40 MG capsule Take 40 mg by mouth daily.    . ondansetron (ZOFRAN ODT) 4 MG disintegrating tablet Take 1-2 tablets three times daily as needed 42 tablet 1  . predniSONE (DELTASONE) 10 MG tablet Take 1 tablet (10 mg total) by mouth daily with breakfast. 60 tablet 5  . SPIRIVA RESPIMAT 2.5 MCG/ACT AERS INHALE 2 PUFFS INTO THE LUNGS DAILY 4 g 11  . sulfamethoxazole-trimethoprim (BACTRIM DS,SEPTRA DS) 800-160 MG tablet Take 1tablet Monday, 1tablet Wednesday, 1tablet Friday 60 tablet 5  . VITAMIN B1-B12 IM Inject 1,000 mcg into the muscle every 30 (thirty) days.     Marland Kitchen VITAMIN E PO Take 1 tablet by mouth daily.     Alveda Reasons 20 MG TABS tablet TAKE 1 TABLET BY MOUTH ONCE DAILY WITH FOOD 90 tablet 2   No current facility-administered medications for this visit.     No  Known Allergies  Review of Systems negative except from HPI and PMH  Physical Exam BP 124/70   Pulse 70   Ht 5' 3.5" (1.613 m)   Wt 146 lb 6.4 oz (66.4 kg)   SpO2 98%   BMI 25.53 kg/m  Well developed and nourished in no acute distress HENT normal Neck supple with JVP-flat Clear Regular rate and rhythm, no murmurs or gallops Abd-soft with active BS No Clubbing cyanosis edema Skin-warm and dry A & Oriented  Grossly normal sensory and motor function   ECG  65 16/08/40 Low voltage    Assessment and  Plan  Atrial Fibrillation :    Raynauds  Pulmonary fibrosis  Hemochromatosis   The patient has had intercurrent atrial fibrillation but this is been an isolated event over the last 8+ years.  Hence, would not change therapies at this time.  Patient is agreeable.  No bleeding. Continue anticoagulation  Will have to follow the course of pulm fibrosis=--it may have impact on frequency of atrial fib  We spent more than 50% of our >25 min visit in face to face counseling regarding the above

## 2018-07-18 NOTE — Patient Instructions (Signed)

## 2018-07-19 ENCOUNTER — Telehealth: Payer: Self-pay | Admitting: Internal Medicine

## 2018-07-19 NOTE — Telephone Encounter (Signed)
MR, please advise on pt's CT results as she is calling for them. Thanks!

## 2018-07-19 NOTE — Telephone Encounter (Signed)
I called and gave patient in resullt  CT results  1. ILD itself is better   2. Small bit of pneumonia present on CT but getting better by itself. HEr choice on antibiotics - if she wants she can   PLAN cephalexin 500mg  three times daily x  7 days (slight bias towards antibiotics)  Hold bactrim during this time  Triage please call in the antibiotics when you can  Thanks  MR  SIGNATURE    Dr. Brand Males, M.D., F.C.C.P,  Pulmonary and Critical Care Medicine Staff Physician, [place] Director - Interstitial Lung Disease  Program  [facility] [place], [place], [address]  Pager: [phone], If no answer or between  15:00h - 7:00h: call [phone] Telephone: [phone]  5:43 PM 07/19/2018    Dg Chest 2 View  Result Date: 07/17/2018 CLINICAL DATA:  Shortness of breath for 4 weeks. EXAM: CHEST - 2 VIEW COMPARISON:  July 10, 2018, August 26, 2014 FINDINGS: The heart size and mediastinal contours are within normal limits. There is consolidation of the right upper lobe unchanged compared prior exam of July 10, 2018. The left lung is clear. There is no pleural effusion. The visualized skeletal structures are stable. IMPRESSION: Persistent right upper lobe pneumonia. Electronically Signed   By: Abelardo Diesel M.D.   On: 07/17/2018 13:52   Ct Chest High Resolution  Result Date: 07/18/2018 CLINICAL DATA:  Follow-up interstitial lung disease. EXAM: CT CHEST WITHOUT CONTRAST TECHNIQUE: Multidetector CT imaging of the chest was performed following the standard protocol without intravenous contrast. High resolution imaging of the lungs, as well as inspiratory and expiratory imaging, was performed. COMPARISON:  07/03/2017 high-resolution chest CT. 07/17/2018 chest radiograph. FINDINGS: Cardiovascular: Normal heart size. No significant pericardial effusion/thickening. Left anterior descending and right  coronary atherosclerosis. Atherosclerotic nonaneurysmal thoracic aorta. Normal caliber pulmonary arteries. Mediastinum/Nodes: No discrete thyroid nodules. Unremarkable esophagus. No pathologically enlarged axillary, mediastinal or hilar lymph nodes, noting limited sensitivity for the detection of hilar adenopathy on this noncontrast study. Lungs/Pleura: No pneumothorax. No pleural effusion. Moderate centrilobular emphysema with mild diffuse bronchial wall thickening. A few scattered solid pulmonary nodules in the lower lobes, largest 6 mm in the left lower lobe (series 11/image 91), all stable since 12/06/2016 chest CT, considered benign. No new significant pulmonary nodules. Mild patchy consolidation and ground-glass opacity at the periphery of right upper lobe and superior segment right lower lobe, vaguely bandlike in configuration, new since 07/03/2017 chest CT, which appears to be improving compared to 07/10/2018 chest radiograph, favoring resolving pneumonia with evolving postinfectious scarring. Otherwise minimal basilar predominant patchy subpleural reticulation and ground-glass attenuation in both lungs without significant bronchiectasis, architectural distortion or frank honeycombing. These findings appear decreased since 07/03/2017 high-resolution chest CT study. No significant lobular air trapping on the expiration sequence. Upper abdomen: Small hiatal hernia. Musculoskeletal: No aggressive appearing focal osseous lesions. Moderate thoracic spondylosis. IMPRESSION: 1. Vaguely bandlike patchy consolidation and ground-glass opacity at the periphery of the right upper and superior segment right lower lobes, new since 07/03/2017 chest CT, improving compared to 07/10/2018 chest radiograph, favoring resolving pneumonia with evolving postinfectious scarring. 2. Previously described basilar predominant subpleural reticulation and ground-glass attenuation is minimal and appears decreased in the interval. No  bronchiectasis or honeycombing. Findings may represent an interstitial lung disease such as nonspecific interstitial pneumonia (NSIP) or less likely early usual interstitial pneumonia (UIP). Findings are indeterminate for UIP per consensus  guidelines: Diagnosis of Idiopathic Pulmonary Fibrosis: An Official ATS/ERS/JRS/ALAT Clinical Practice Guideline. [place], Iss 5, [phone], Feb 25 2017. 3. Moderate centrilobular emphysema with mild diffuse bronchial wall thickening, suggesting COPD. 4. Two-vessel coronary atherosclerosis. 5. Small hiatal hernia. Aortic Atherosclerosis (ICD10-I70.0) and Emphysema (ICD10-J43.9). Electronically Signed   By: Ilona Sorrel M.D.   On: 07/18/2018 15:52        No Known Allergies

## 2018-07-20 MED ORDER — CEPHALEXIN 500 MG PO CAPS: 500.0000 mg | ORAL_CAPSULE | Freq: Three times a day (TID) | ORAL | 0 refills | Status: DC

## 2018-07-20 NOTE — Telephone Encounter (Signed)
Called and spoke with Patient to clarify pharmacy.  MR instructions for antibiotics given.  Instructed to hold Bactrim. Cephalexin prescription sent to [facility].  Nothing further at this time.

## 2018-08-14 ENCOUNTER — Encounter: Payer: Self-pay | Admitting: Internal Medicine

## 2018-08-14 ENCOUNTER — Ambulatory Visit (INDEPENDENT_AMBULATORY_CARE_PROVIDER_SITE_OTHER): Payer: Medicare Other | Admitting: Internal Medicine

## 2018-08-14 VITALS — BP 120/64 | HR 75 | Ht 63.75 in | Wt 148.0 lb

## 2018-08-14 DIAGNOSIS — Z8701 Personal history of pneumonia (recurrent): Secondary | ICD-10-CM

## 2018-08-14 DIAGNOSIS — Z79899 Other long term (current) drug therapy: Secondary | ICD-10-CM

## 2018-08-14 DIAGNOSIS — Z5181 Encounter for therapeutic drug level monitoring: Secondary | ICD-10-CM

## 2018-08-14 DIAGNOSIS — I7300 Raynaud's syndrome without gangrene: Secondary | ICD-10-CM

## 2018-08-14 DIAGNOSIS — R768 Other specified abnormal immunological findings in serum: Secondary | ICD-10-CM

## 2018-08-14 DIAGNOSIS — J849 Interstitial pulmonary disease, unspecified: Secondary | ICD-10-CM | POA: Diagnosis not present

## 2018-08-14 DIAGNOSIS — J439 Emphysema, unspecified: Secondary | ICD-10-CM

## 2018-08-14 LAB — PULMONARY FUNCTION TEST
DL/VA % pred: 68 %
DL/VA: 2.8 ml/min/mmHg/L
DLCO unc % pred: 70 %
DLCO unc: 13.19 ml/min/mmHg
FEF 25-75 Pre: 1.02 L/sec
FEF2575-%Pred-Pre: 65 %
FEV1-%Pred-Pre: 96 %
FEV1-Pre: 1.95 L
FEV1FVC-%Pred-Pre: 89 %
FEV6-%Pred-Pre: 112 %
FEV6-Pre: 2.89 L
FEV6FVC-%Pred-Pre: 104 %
FVC-%Pred-Pre: 108 %
FVC-Pre: 2.92 L
Pre FEV1/FVC ratio: 67 %
Pre FEV6/FVC Ratio: 99 %

## 2018-08-14 NOTE — Patient Instructions (Addendum)
Interstitial pulmonary disease (HCC) Scl-70 antibody positive [name] phenomenon without gangrene High risk medication use Therapeutic drug monitoring  - ILD itself seems better  - reduce prednisone to 5 mg per day but continue myfortic and bactrim at current dose - do cbc, bmet, lft 08/14/2018  - out of proportion shortness of breath: monitor    Pulmonary emphysema, unspecified emphysema type ([place]) -  Continue spiriva   History Pneumonia - mid jan 2020 - do followup cxr 2 view 3rd week march 2020  Followup 3 months do spirometry and dlco Return to ILD clinic in 3 months

## 2018-08-14 NOTE — Addendum Note (Signed)
Addended by: Suzzanne Cloud E on: 08/14/2018 04:06 PM   Modules accepted: Orders

## 2018-08-14 NOTE — Progress Notes (Signed)
Spirometry and Dlco done today. 

## 2018-08-14 NOTE — Progress Notes (Signed)
HPI   PCP [name],[name], PA-C   HPI  IOV 02/10/2016  Chief Complaint  Patient presents with  . Pulmonary Consult    Pt referred by Dr. Melford Aase for chronic cough x 1 year. Pt states she feels she has a tickle that is causing her dry cough. Pt states she has DOE when climbing stairs. Pt deneis CP/tightness.     78 year old female with hemochromatosis homozygous gene followed by Dr. Beryle Beams (last phlebotomy many years ago and is on serial monitoring) with children and siblings with active disease. She has also atrial fibrillation followed by Dr. Caryl Comes. Reports insidious onset of chronic cough in the last 1 year. It is stable since onset. It fluctuates between mild and severe in severity. It is mostly dry in quality. Mostly present in the daytime but sometimes also wakes her up at night. It is definitely not progressive. It is episodic and present every day. Aggravated by talking sometimes and then the throat feels dry associated with tickle in the throat and also sensation the cough is coming from the upper chest retrosternally and sometimes relieved by drinking water or chewing on a lozenge. Also aggravated by seasonal changes particularly in the spring and the fall which makes her think she has allergies. There is sometimes associated gag. She also reports nonspecific occasional wheezing and associated shortness of breath that is nonspecific but present with exertion and relieved by rest No other clear cut aggravating or relieving factors.   cough associated history  - Medications: She is not on fish oil or ACE inhibitors - Sinus drainage: She does admit to spring allergies. She did have something removed from her hard palate several years ago by ENT does not know details - Acid reflux: She says she is significant acid reflux and is on Prilosec. Without which she'll have significant symptoms - Pulmonary disease: FenO 12 02/10/2016  and normal. She denies any personal history of asthma  or pulmonary fibrosis of COPD or emphysema smoked one pack per day started smoking at age 86 and quit in 1999. Making a 42 pack smoking history. Chest x-ray 08/26/2014 personally visualized is clear - Tobacco :  reports that she quit smoking about 18 years ago. She has a 42.00 pack-year smoking history. She has never used smokeless tobacco.       OV 02/18/2016  Chief Complaint  Patient presents with  . Follow-up    Pt here after PFT and HRCT. Pt denies changes in SOB and cough. Pt denies any new complaints at this time.    Follow-up chronic cough. This visit is to follow-up on test results of function test and high resolution CT chest which are described below9 results show isolated reduction in diffusion capacity which can be explained by possible early ILD and also emphysema. There are also new findings of nodule 6 mm bilaterally on lower lobes. She prefers a very cautious approach to this evaluation.     Pulmonary function test 02/12/2016 - FVC 2.8 cm/101%, FEV1 1.9 L/91%. Ratio 60/90%. Total lung capacity 115%. DLCO reduced at 14.5/60%. She is isolated reduction in diffusion capacity    HRCT chest 02/16/16 IMPRESSION: 1. Mild patchy subpleural reticulation in both lungs with a basilar predominance. No significant traction bronchiectasis. No frank honeycombing. Findings could represent an interstitial lung disease such as nonspecific interstitial pneumonia (NSIP), with early usual interstitial pneumonia (UIP) not excluded. A follow-up high-resolution chest CT study in 12 months is recommended to assess temporal pattern stability. 2. Bilateral lower  lobe solid pulmonary nodules, largest 6 mm. Non-contrast chest CT at 3-6 months is recommended. If the nodules are stable at time of repeat CT, then future CT at 18-24 months (from today's scan) is considered optional for low-risk patients, but is recommended for high-risk patients. This recommendation follows the consensus  statement: Guidelines for Management of Incidental Pulmonary Nodules Detected on CT Images:From the Fleischner Society 2017; published online before print (10.1148/radiol.[id number]). 3. Additional findings include mild-to-moderate centrilobular emphysema, aortic atherosclerosis and 2 vessel coronary atherosclerosis.   Electronically Signed   By: Ilona Sorrel M.D.   On: 02/16/2016 14:14   OV 08/31/2016  Chief Complaint  Patient presents with  . Follow-up    HRCT was never scheduled. Pt states that the cough is still present >> slightly improved since last OV. Pt states that she feels her allergies has gotten it ramped up again.    Follow-up multifactorial cough associated with autoimmune antibody positive, interstitial lung disease and emphysema previous history of smoking  After last visit in August 2017 she had high resolution CT chest in December 2017 that showed persistence of ILD. I personally visualized the CT chest. He comes for follow-up. She tells me that since August 2017 she has insidious onset of shortness of breath and is progressively worse. It is mild and present only on inclines is not therefore tenderness. In terms of her cough is persistent. It is associated with significant sinus drainage that she thinks is allergy related. She constantly clears her throat. Lab review shows autoimmune antibody positive in August 2017 with scleroderma antibody slightly elevated at 6.4. I referred her to rheumatology but she does not remember this and she did not make this follow-up. She also tells me that she definitely has a long history of [name] phenomena in her fingers but no one has ever formally diagnosed with connective tissu  e disease.  OV 11/28/2016  Chief Complaint  Patient presents with  . Follow-up    Pt here after PFT. Pt states her breathing is uchanged since last OV. Pt c/o dry cough.- pt states this has slightly improved since last OV. Pt denies CP/tightness and  f/c/s.     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 Since then she has seen rheumatologis Dr. Keturah Barre. I reviewd the notes and also discussed with the patient wa a good understading of what s going on. She tells me that some of the lupus antibodies have been positive correlating with Raynaud. Patient is on beta blocker for atrial fbrillation and it is the preference by the rheumatologist Dr. D that she switc to calcium channel blocker. She has a follow-up appointment pending with her electrophysiologist Dr. Caryl Comes.  She is scleroderma antibody positive but according to her rheumtolgist she does not have dermatologic  Manifestation of the disese. 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 She pulm  function test today and this is stable. In terms of arrest or symptoms she stable. The cough is actually improved. But she does get dyspneic walking up stairs especially a few flights. She does not wnt her to pulmonary habilitation because se exrcises on the treadmill although she does not monitor her saturations.   reports that she quit smoking about 19 years ago. She has a 42.00 pack-year smoking history. she has never used smokeless tobacco.   OV 06/12/2017  Chief  Complaint  Patient presents with  . Follow-up    Pt states that she has been doing good since last visit. States that she has a "tickle cough" that is sporadic and has SOB that she states is when she climbs 2 flights of stairs. Denies any CP.    Follow-up combined emphysema was interstitial lung disease [autoimmune undifferentiated connective tissue disease interstitial lung disease]. Autoimmune features from May 2018 rheumatology noted with Dr. Keturah Barre: Raynaud phenomenon positive without gangrene, positive anti-cardiolipin and positive IgM and SCL-70 positive without clinical  features of scleroderma    Last seen June 2018. Since then she's stable. Overall she tells me that only problem is a tickle in her throat and slight cough. This is up and down depending on the pollen exposure she gets. She gets dyspneic for climbing few to several flights of stairs. This is unchanged. She did have full function test today and that shows mild significant worsening though overall gradient is only mild.. However she's not feeling this. She has appointment with Dr. Keturah Barre  pending    OV 07/18/2017  Chief Complaint  Patient presents with  . Follow-up    HRCT done 07/03/17.  Pt states she is the same as she was at last visit. SOB with exertion.    Follow-up combined emphysema was interstitial lung disease [autoimmune undifferentiated connective tissue disease interstitial lung disease]. Autoimmune features from May 2018 rheumatology noted with Dr. Keturah Barre: Raynaud phenomenon positive without gangrene, positive anti-cardiolipin and positive IgM and SCL-70 positive without clinical features of scleroderma    [name] presents for follow-up.  She is here to discuss the results of a high-resolution CT scan of the chest.  The scan was reviewed by Dr. Rosario Jacks thoracic radiology who feels that patient has probable UIP pattern that is definitely progressive compared to August 2017 although there is no comment about progression since June 2018 CT scan.  The pulmonary nodules itself are stable since August 2017 and are likely benign.  The change in the CT scan which is mild progression pulmonary fibrosis corresponds with the pulmonary function test that shows mild progression.  She is now here with her daughter Junie [name] and her husband.  She also states that 1 of her other daughters has rheumatoid arthritis and is on TNF alpha blockade.  All her 3 daughters have hemo-chromatosis    OV 08/29/2017  Chief Complaint  Patient presents with  . Follow-up    Pt states she has been doing good since last  visit. Pt recently went to [place] and had some mild problems with SOB due to heat.  Pt was prescribed cellcept and bactrim but has not yet started meds due to questions with other meds she is taking.    78 year old female with ILD secondary to autoimmune disease clinically suspicious of scleroderma but also previous history of hemochromatosis with family history of hemochromatosis.  She is here with her husband.  This visit is only a discussion visit.  Because she has many questions about starting CellCept before she actually does.  We did hepatitis virus panel, QuantiFERON gold, G6PD and all this is normal.  Lab work is normal.  I checked with [facility] pharmacist about interactions and was cleared for her to start CellCept.  The only recommendation was for patient to come off Prilosec because of reduced effect of CellCept.  Patient questions revolved around CellCept: She does not want to come off PPI because of hiatal hernia and has had bad acid reflux.  So I would not Willis Modena asking for any alternative PPI.  In addition she states she takes multiple multivitamins including Biotene, vitamin D3, vitamin D, vitamin B51 and folic acid.  She also takes Allegra occasionally.  She wants to make sure it is all okay with CellCept.  She has not had a flu shot today and she is asking if she should have it.  She has never had flu shot before.  She is also wondering about anticoagulation in the setting of CellCept and thyroid issues in the setting of Bactrim.  In terms of her genetics: Initially I corresponded with our local geneticist who thought she might be better served at [facility] but the [facility] genetics person wrote to me saying that patient should first be seen by [facility] rheumatology and hematology.  Patient's not so sure she wants to see the subspecialist.  She will speak to Dr. Beryle Beams her local hematologist to see if it is worthwhile seeing the hematology department at  [facility].   Lungs/Pleura: Moderate centrilobular emphysema. Mild basilar predominant subpleural reticulation and ground-glass, increased from 02/16/2016. No traction bronchiectasis/ bronchiolectasis, architectural distortion or honeycombing. Side-by-side nodules in the medial left lower lobe measure up to 6 mm (series 3, image 95), unchanged from 02/16/2016 and considered benign. No air trapping. No pleural fluid. Airway is unremarkable.  IMPRESSION: 1. Mild progression in basilar predominant subpleural fibrosis from 02/16/2016, raising suspicion for usual interstitial pneumonitis. 2. Aortic atherosclerosis (ICD10-170.0). Moderate coronary artery calcification. 3.  Emphysema (ICD10-J43.9).   Electronically Signed   By: Lorin Picket M.D.   On: 07/03/2017 09:54    OV 09/19/2017  Chief Complaint  Patient presents with  . Follow-up    Pt states she has been doing good. Currently on cellcept and bactrim and has been doing well on it once put back on omeprazole. Pt still has the cough and sometimes when trying to take a deep breath can't fully get all O2 out.   [name] returns for follow-up.  This is for autoimmune interstitial lung disease.  She was started on CellCept recently.  She is here to follow-up for therapeutic drug monitoring.  Because she was on omeprazole and we were initiating CellCept with Bactrim we asked her to hold off on omeprazole because the omeprazole impairs drug levels of CellCept.  However she tried it for a week and her acid reflux was significant despite ranitidine so she went back on omeprazole.  Pharmacist Willis Modena is advising changing the CellCept to equivaekbtdose of Myfortic. So far tolerating cellcept/pred/bactrimm well. Has seen dR G in heme and on observation. Had PFT today on cellcept/pred/bactrim and is better! Continue siwht spriva  OV 10/31/2017  . Chief Complaint  Patient presents with  . Follow-up    Pt currently  taking myfortic and states she has been well on that. Pt states she has some mild nausea and has some problems with sleeping at night. SOB is stable,mild coughing. Denies any CP.    Follow-up progressive interstitial lung disease with SCL-70 antibody positive and Raynard Follow-up associated emphysema.  Last visit September 19, 2017.  At that time she had intolerance to the CellCept therefore we switched her to Myfortic.  She is here to report for follow-up about this.  She is tolerating Myfortic just well.  She had some transient insomnia for a few days last week but this resolved.  She only has mild intermittent occasional nausea with the Myfortic but otherwise is tolerating it really well  at the low-dose of 360 mg twice daily.  She continues on prednisone 10 mg daily and Bactrim 3 times a week.  She is due for blood work today.  There are no other new issues.  She continues on Spiriva for her emphysema   OV 02/01/2018  Chief Complaint  Patient presents with  . Follow-up    PFT performed today.  Pt stated other than having shingles earlier, things have been doing good for her. Pt states SOB is stable and states she still has an occ cough.    Follow-up progressive interstitial lung disease with SCL-70 antibody positive and Raynud - IPAF Follow-up associated emphysema   [name] presents for routine follow-up with interstitial lung disease clinic. She has the above problems. In the interim she tells me she had developed shingles on the right neck but this is resolved. This is despite having zoster vaccine in 2012. She was yet to have the bnnew  shingles vaccine.in any event currently she is feeling well. She only has occasional post residual neuropathic pain. In terms of her shortness of breath it is stable and minimal. Walking desaturation test shows stability. Pulmicort function test below show stability. She is tolerating the mycophenolate myfortic well at 360 mg twice daily and prednisone  10 mg daily and Bactrim for PCP prophylaxis. Overall she feels stable and feels that it is best medicines on touch in terms of dosing change  OV 08/14/2018  Subjective:  Patient ID: [name], female , DOB: Jan 03, 1941 , age 68 y.o. , MRN: [id number] , ADDRESS: [place] [id number]   08/14/2018 -   Chief Complaint  Patient presents with  . Follow-up    PFT performed today.  Pt states she is slowly getting better after her recent pna. States she still has some issues with SOB and an occ cough. Pt also states that she feels like she has a weight on her chest at times.    Follow-up progressive interstitial lung disease with SCL-70 antibody positive and Raynud - IPAF - on myfortic since march 2019 with pred 10 and bactrim  Follow-up associated emphysema - on spiriva    HPIfortic [name] 78 y.o. -returns for 33-monthfollow-up of her interstitial lung disease associated with emphysema.  She presents with her middle daughter Tara Panningwho is met me before.  She continues on Myfortic and prednisone 10 mg and Bactrim prophylaxis.  She continues on Spiriva.  She had an episode of atrial fibrillation according to review of the chart and my on history with in the middle of January 2020.  At this time she had a chest x-ray that showed asymptomatic upper lobe pneumonia.  I personally visualized the chest x-ray and confirmed the findings.  Antibiotic was not prescribed.  Then I follow this up with a CT scan of the chest [we canceled her elective colonoscopy].  CT chest showed improved pneumonia findings.  I prescribed even though she was feeling well cephalexin for a week.  She says she took it.  She continues to feel well except she has baseline amount of symptoms that is documented below.  Of note the high-resolution CT chest showed an improvement in ILD compared to a year ago.  In fact her pulmonary function tests are also showing stability/improvement as documented below.  She  is very happy about this outcome with immunosuppression .  However, she is a little bit unsure why she is symptomatic in terms of shortness of  breath and cough.  She is very concerned that her fibrosis is getting worse.  She wants to have closer follow-up than every 6 months.    SYMPTOM SCALE - ILD 08/14/2018   O2 use *RA  Shortness of Breath 0 -> 5 scale with 5 being worst (score 6 If unable to do)  At rest 1  Simple tasks - showers, clothes change, eating, shaving 1.5  Household (dishes, doing bed, laundry) 1.5  Shopping 1.5  Walking level at own pace 2.5  Walking keeping up with others of same age 77  Walking up Stairs 2.5  Walking up Hill 2.5  Total (40 - 48) Dyspnea Score 15  How bad is your cough? 1.5  How bad is your fatigue 1.5         Results for [name], [name] (MRN [id number]) as of 08/14/2018 15:32  Ref. Range 02/12/2016 12:57 11/28/2016 10:53 06/12/2017 11:18 09/19/2017 11:48 02/01/2018 11:55 08/14/2018 13:28  FVC-Pre Latest Units: L 2.85 2.81 2.68 3.04 2.87 2.92  FVC-%Pred-Pre Latest Units: % 101 101 97 111 105 108      Results for [name], [name] (MRN [id number]) as of 11/28/2016 12:08  Ref. Range 02/12/2016 12:57 11/28/2016 10:53 06/12/2017  09/19/2017  02/01/2018  08/14/2018   DLCO cor Latest Units: ml/min/mmHg 14.54 13.36 12.17 14.29 13.07 13.19 GLI equation  DLCO cor % pred Latest Units: % 60 55 50 59 54% 70%  CT     Jan 2019 - mild prog in CT since jan 2019     Simple office walk 185 feet x  3 laps goal with forehead probe 10/31/2017  02/01/2018  08/14/2018   O2 used Room air Room air Room air  Number laps completed _0 Comments about pace Normal pace normal normal  Resting Pulse Ox/HR 98% and 63/min 99% and 80/min 100% and 70/min  Final Pulse Ox/HR 99% and 80/min 97% and 91/min 99% and 90/min  Desaturated </= 88% no no no  Desaturated <= 3% points no no no  Got Tachycardic >/= 90/min no yes yes  Symptoms at end of test none Mild dyspnea No dyspnea    Miscellaneous comments none x     IMPRESSION: 1. Vaguely bandlike patchy consolidation and ground-glass opacity at the periphery of the right upper and superior segment right lower lobes, new since 07/03/2017 chest CT, improving compared to 07/10/2018 chest radiograph, favoring resolving pneumonia with evolving postinfectious scarring. 2. Previously described basilar predominant subpleural reticulation and ground-glass attenuation is minimal and appears decreased in the interval. No bronchiectasis or honeycombing. Findings may represent an interstitial lung disease such as nonspecific interstitial pneumonia (NSIP) or less likely early usual interstitial pneumonia (UIP). Findings are indeterminate for UIP per consensus guidelines: Diagnosis of Idiopathic Pulmonary Fibrosis: An Official ATS/ERS/JRS/ALAT Clinical Practice Guideline. [name], Iss 5, [phone], Feb 25 2017. 3. Moderate centrilobular emphysema with mild diffuse bronchial wall thickening, suggesting COPD. 4. Two-vessel coronary atherosclerosis. 5. Small hiatal hernia.  Aortic Atherosclerosis (ICD10-I70.0) and Emphysema (ICD10-J43.9). ROS - per HPI     has a past medical history of Atrial fibrillation ([place]), Chest pain, Depression with anxiety, GERD (gastroesophageal reflux disease), Hemochromatosis, History of syncope, and Hypothyroidism.   reports that she quit smoking about 21 years ago. She has a 42.00 pack-year smoking history. She has never used smokeless tobacco.  Past Surgical History:  Procedure Laterality Date  . MOUTH SURGERY    . SHOULDER ARTHROSCOPY Left   . TOTAL  ABDOMINAL HYSTERECTOMY    . VARICOSE VEIN SURGERY      No Known Allergies  Immunization History  Administered Date(s) Administered  . Influenza, High Dose Seasonal PF 08/29/2017, 04/26/2018  . Pneumococcal Conjugate-13 06/12/2014  . Pneumococcal Polysaccharide-23 06/27/2010  . Tdap 03/16/2011  . Zoster  06/27/2010    Family History  Problem Relation Age of Onset  . Stroke Mother   . Heart disease Father 77  . Hemochromatosis Brother      Current Outpatient Medications:  .  Biotin 1000 MCG tablet, Take 1,000 mcg by mouth daily., Disp: , Rfl:  .  Calcium Carbonate 1500 MG TABS, Take 1 tablet by mouth 3 (three) times a week., Disp: , Rfl:  .  Cholecalciferol (VITAMIN D PO), Take 5,000 Units by mouth daily. , Disp: , Rfl:  .  flecainide (TAMBOCOR) 100 MG tablet, TAKE ONE-HALF TABLET BY  MOUTH IN THE MORNING AND  ONE-HALF TABLET IN THE  EVENING, Disp: 90 tablet, Rfl: 1 .  folic acid (FOLVITE) 1 MG tablet, Take 1 mg by mouth daily.  , Disp: , Rfl:  .  levothyroxine (SYNTHROID, LEVOTHROID) 112 MCG tablet, Take 112 mcg by mouth daily.  , Disp: , Rfl:  .  metoprolol succinate (TOPROL-XL) 25 MG 24 hr tablet, TAKE 1 TABLET BY MOUTH  DAILY, Disp: 90 tablet, Rfl: 2 .  mycophenolate (MYFORTIC) 360 MG TBEC EC tablet, TAKE 1 TABLET BY MOUTH TWICE DAILY, Disp: 60 tablet, Rfl: 2 .  omeprazole (PRILOSEC) 40 MG capsule, Take 40 mg by mouth daily., Disp: , Rfl:  .  predniSONE (DELTASONE) 10 MG tablet, Take 1 tablet (10 mg total) by mouth daily with breakfast., Disp: 60 tablet, Rfl: 5 .  SPIRIVA RESPIMAT 2.5 MCG/ACT AERS, INHALE 2 PUFFS INTO THE LUNGS DAILY, Disp: 4 g, Rfl: 11 .  sulfamethoxazole-trimethoprim (BACTRIM DS,SEPTRA DS) 800-160 MG tablet, Take 1tablet Monday, 1tablet Wednesday, 1tablet Friday, Disp: 60 tablet, Rfl: 5 .  VITAMIN B1-B12 IM, Inject 1,000 mcg into the muscle every 30 (thirty) days. , Disp: , Rfl:  .  XARELTO 20 MG TABS tablet, TAKE 1 TABLET BY MOUTH ONCE DAILY WITH FOOD, Disp: 90 tablet, Rfl: 2 .  fluticasone (FLONASE) 50 MCG/ACT nasal spray, Place 2 sprays into both nostrils daily. (Patient not taking: Reported on 08/14/2018), Disp: 16 g, Rfl: 5 .  gabapentin (NEURONTIN) 100 MG capsule, Take 1 capsule (100 mg total) by mouth 3 (three) times daily. (Patient not taking: Reported on  08/14/2018), Disp: 60 capsule, Rfl: 0 .  methocarbamol (ROBAXIN) 750 MG tablet, Take 1 tablet (750 mg total) by mouth at bedtime as needed for muscle spasms. (Patient not taking: Reported on 08/14/2018), Disp: 30 tablet, Rfl: 2 .  ondansetron (ZOFRAN ODT) 4 MG disintegrating tablet, Take 1-2 tablets three times daily as needed (Patient not taking: Reported on 08/14/2018), Disp: 42 tablet, Rfl: 1      Objective:   Vitals:   08/14/18 1522  BP: 120/64  Pulse: 75  SpO2: 97%  Weight: 148 lb (67.1 kg)  Height: 5' 3.75" (1.619 m)    Estimated body mass index is 25.6 kg/m as calculated from the following:   Height as of this encounter: 5' 3.75" (1.619 m).   Weight as of this encounter: 148 lb (67.1 kg).  _0 @  Filed Weights   08/14/18 1522  Weight: 148 lb (67.1 kg)     Physical Exam  General Appearance:    Alert, cooperative, no distress, appears stated age - yes ,  Deconditioned looking - no , OBESE  - no, Sitting on Wheelchair -  no  Head:    Normocephalic, without obvious abnormality, atraumatic  Eyes:    PERRL, conjunctiva/corneas clear,  Ears:    Normal TM's and external ear canals, both ears  Nose:   Nares normal, septum midline, mucosa normal, no drainage    or sinus tenderness. OXYGEN ON  - no . Patient is @ ra   Throat:   Lips, mucosa, and tongue normal; teeth and gums normal. Cyanosis on lips - no  Neck:   Supple, symmetrical, trachea midline, no adenopathy;    thyroid:  no enlargement/tenderness/nodules; no carotid   bruit or JVD  Back:     Symmetric, no curvature, ROM normal, no CVA tenderness  Lungs:     Distress - no , Wheeze no, Barrell Chest - no, Purse lip breathing - no, Crackles - no   Chest Wall:    No tenderness or deformity.    Heart:    Regular rate and rhythm, S1 and S2 normal, no rub   or gallop, Murmur - no  Breast Exam:    NOT DONE  Abdomen:     Soft, non-tender, bowel sounds active all four quadrants,    no masses, no organomegaly. Visceral  obesity - no  Genitalia:   NOT DONE  Rectal:   NOT DONE  Extremities:   Extremities - normal, Has Cane - no, Clubbing - no, Edema - no  Pulses:   2+ and symmetric all extremities  Skin:   Stigmata of Connective Tissue Disease - skin tightening around face  Lymph nodes:   Cervical, supraclavicular, and axillary nodes normal  Psychiatric:  Neurologic:   Pleasant - yes, Anxious - no, Flat affect - no  CAm-ICU - neg, Alert and Oriented x 3 - yes, Moves all 4s - yes, Speech - normal, Cognition - intact           Assessment:       ICD-10-CM   1. Interstitial pulmonary disease (HCC) J84.9 CBC w/Diff    Basic Metabolic Panel (BMET)    Hepatic function panel    Pulmonary function test  2. 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 [name] phenomenon without gangrene I73.00 Pulmonary function test  4. High risk medication use Z79.899 CBC w/Diff    Basic Metabolic Panel (BMET)    Hepatic function panel  5. Therapeutic drug monitoring Z51.81 CBC w/Diff    Basic Metabolic Panel (BMET)    Hepatic function panel  6. Pulmonary emphysema, unspecified emphysema type ([place]) J43.9   7. History of bacterial pneumonia Z87.01 DG Chest 2 View       Plan:     Patient Instructions  Interstitial pulmonary disease (HCC) Scl-70 antibody positive [name] phenomenon without gangrene High risk medication use Therapeutic drug monitoring  - ILD itself seems better  - reduce prednisone to 5 mg per day but continue myfortic and bactrim at current dose - do cbc, bmet, lft 08/14/2018  - out of proportion shortness of breath: monitor    Pulmonary emphysema, unspecified emphysema type ([place]) -  Continue spiriva   History Pneumonia - mid jan 2020 - do followup cxr 2 view 3rd week march 2020  Followup 3 months do spirometry and dlco Return to ILD clinic in 3 months     SIGNATURE    Dr. Brand Males, M.D., F.C.C.P,    Pulmonary  and Critical Care Medicine Staff Physician, [place] Director - Interstitial Lung Disease  Program  P[facility] [place] at [place], [place], [address]  Pager: [phone], If no answer or between  15:00h - 7:00h: call [phone] Telephone: [phone]  3:59 PM 08/14/2018

## 2018-08-15 ENCOUNTER — Other Ambulatory Visit: Payer: Self-pay | Admitting: Internal Medicine

## 2018-08-15 LAB — CBC WITH DIFFERENTIAL/PLATELET
Basophils Absolute: 0.1 10*3/uL (ref 0.0–0.1)
Basophils Relative: 1 % (ref 0.0–3.0)
Eosinophils Absolute: 0 10*3/uL (ref 0.0–0.7)
Eosinophils Relative: 0.6 % (ref 0.0–5.0)
HCT: 35.8 % — ABNORMAL LOW (ref 36.0–46.0)
Hemoglobin: 11.8 g/dL — ABNORMAL LOW (ref 12.0–15.0)
Lymphocytes Relative: 22.3 % (ref 12.0–46.0)
Lymphs Abs: 1.4 10*3/uL (ref 0.7–4.0)
MCHC: 32.9 g/dL (ref 30.0–36.0)
MCV: 99.8 fl (ref 78.0–100.0)
Monocytes Absolute: 0.5 10*3/uL (ref 0.1–1.0)
Monocytes Relative: 7.6 % (ref 3.0–12.0)
Neutro Abs: 4.3 10*3/uL (ref 1.4–7.7)
Neutrophils Relative %: 68.5 % (ref 43.0–77.0)
Platelets: 251 10*3/uL (ref 150.0–400.0)
RBC: 3.59 Mil/uL — ABNORMAL LOW (ref 3.87–5.11)
RDW: 13.7 % (ref 11.5–15.5)
WBC: 6.4 10*3/uL (ref 4.0–10.5)

## 2018-08-15 LAB — BASIC METABOLIC PANEL
BUN: 14 mg/dL (ref 6–23)
CO2: 28 mEq/L (ref 19–32)
Calcium: 9.2 mg/dL (ref 8.4–10.5)
Chloride: 103 mEq/L (ref 96–112)
Creatinine, Ser: 0.72 mg/dL (ref 0.40–1.20)
GFR: 78.52 mL/min (ref >60.00)
Glucose, Bld: 86 mg/dL (ref 70–99)
Potassium: 4.3 mEq/L (ref 3.5–5.1)
Sodium: 138 mEq/L (ref 135–145)

## 2018-08-15 LAB — HEPATIC FUNCTION PANEL
ALT: 10 U/L (ref 0–35)
AST: 13 U/L (ref 0–37)
Albumin: 4.2 g/dL (ref 3.5–5.2)
Alkaline Phosphatase: 37 U/L — ABNORMAL LOW (ref 39–117)
Bilirubin, Direct: 0.1 mg/dL (ref 0.0–0.3)
Total Bilirubin: 0.4 mg/dL (ref 0.2–1.2)
Total Protein: 6.5 g/dL (ref 6.0–8.3)

## 2018-08-20 ENCOUNTER — Encounter: Payer: Self-pay | Admitting: *Deleted

## 2018-08-21 ENCOUNTER — Encounter: Payer: Self-pay | Admitting: Hematology

## 2018-08-21 ENCOUNTER — Telehealth: Payer: Self-pay | Admitting: Hematology

## 2018-08-21 ENCOUNTER — Other Ambulatory Visit: Payer: Self-pay

## 2018-08-21 ENCOUNTER — Ambulatory Visit (INDEPENDENT_AMBULATORY_CARE_PROVIDER_SITE_OTHER): Payer: Medicare Other | Admitting: Oncology

## 2018-08-21 ENCOUNTER — Encounter: Payer: Self-pay | Admitting: Oncology

## 2018-08-21 DIAGNOSIS — Z79890 Hormone replacement therapy (postmenopausal): Secondary | ICD-10-CM | POA: Diagnosis not present

## 2018-08-21 DIAGNOSIS — Z87891 Personal history of nicotine dependence: Secondary | ICD-10-CM

## 2018-08-21 DIAGNOSIS — Z7901 Long term (current) use of anticoagulants: Secondary | ICD-10-CM | POA: Diagnosis not present

## 2018-08-21 DIAGNOSIS — E039 Hypothyroidism, unspecified: Secondary | ICD-10-CM | POA: Diagnosis not present

## 2018-08-21 DIAGNOSIS — D510 Vitamin B12 deficiency anemia due to intrinsic factor deficiency: Secondary | ICD-10-CM | POA: Diagnosis not present

## 2018-08-21 DIAGNOSIS — Z148 Genetic carrier of other disease: Secondary | ICD-10-CM | POA: Diagnosis not present

## 2018-08-21 DIAGNOSIS — J849 Interstitial pulmonary disease, unspecified: Secondary | ICD-10-CM

## 2018-08-21 DIAGNOSIS — Z7952 Long term (current) use of systemic steroids: Secondary | ICD-10-CM

## 2018-08-21 DIAGNOSIS — J8410 Pulmonary fibrosis, unspecified: Secondary | ICD-10-CM | POA: Diagnosis not present

## 2018-08-21 DIAGNOSIS — I480 Paroxysmal atrial fibrillation: Secondary | ICD-10-CM

## 2018-08-21 DIAGNOSIS — Z79899 Other long term (current) drug therapy: Secondary | ICD-10-CM

## 2018-08-21 DIAGNOSIS — Z8639 Personal history of other endocrine, nutritional and metabolic disease: Secondary | ICD-10-CM

## 2018-08-21 NOTE — Patient Instructions (Signed)
To lab today  Referral was made to Dr Truitt Merle at [facility] for your ongoing Hematology care. Call [phone] for an appointment if you don't get a call by the end of March.

## 2018-08-21 NOTE — Telephone Encounter (Signed)
New hem appt has been scheduled for the pt to see Dr. Burr Medico on 3/23 at 2pm. Letter mailed.

## 2018-08-22 ENCOUNTER — Encounter: Payer: Self-pay | Admitting: Oncology

## 2018-08-22 LAB — FERRITIN: Ferritin: 18 ng/mL (ref 15–150)

## 2018-08-22 NOTE — Progress Notes (Signed)
Hematology and Oncology Follow Up Visit  [name] [id number] [birth year] 78 y.o. 08/22/2018 3:21 PM   Principle Diagnosis: Encounter Diagnosis  Name Primary?  . 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 She has an interesting family history since the gene is dominant but only variably expressed in different family members. One of her brothers had clinical hemachromatosis and developed end-stage liver disease requiring transplant and ultimately died of complications of his disease. Another brother is affected and on a phlebotomy program. A third brother is a heterozygote. All 3 of her daughters are affected. All are homozygotes. 2 of3 have not required phlebotomies despite homozygous status.  Has been recently tested and found to be a heterozygote which helps explain why all their children are homozygotes. [name] has only required a rare phlebotomy since her ferritin levels run low. She initially required phlebotomy December 2010 at which time the ferritin was 248. She was started on monthly phlebotomy program. Ferritin was down to 58 by 02/01/2010. Interval between phlebotomies was decreased every 3 months. In October 2012 the ferritin was down to 10. She was last phlebotomized in January 2013. Ferritin consistently low since that time and she has not required subsequent phlebotomy. She has a remote diagnosis of pernicious anemia and is still getting monthly B12 injections.  She has developed pulmonary fibrosis and is under the care of Dr. Chase Caller. She is on immunosuppression with low-dose prednisone 10 mg daily and Myfortic 360 mg daily. She is on Xarelto for stroke prophylaxis since she developed atrial fibrillation in November 2017.  She also takes flecainide for rhythm control. She is on Synthroid for hypothyroidism.  Interim History: Overall stable despite her medical problems.   Pulmonary function has leveled on current regimen.  Recent high resolution CT scanning of the chest done July 18, 2018 showed an incidental pulmonary infiltrate and she was treated for outpatient pneumonia.  Of note she had no cough or fever. Despite her homozygous status for the C282Y hemochromatosis gene, her ferritin levels remain low with no need for phlebotomy with value today 18.  Highest value recorded in our system was 262 in April 2011.  There was a less time she required phlebotomy.  Within 4 months she was down to a ferritin of 58.   Medications: reviewed  Allergies: No Known Allergies  Review of Systems: See interim history Remaining ROS negative:   Physical Exam: Blood pressure 119/61, pulse 70, temperature 97.6 F (36.4 C), temperature source Oral, height 5\' 4"  (1.626 m), weight 147 lb (66.7 kg), SpO2 99 %. Wt Readings from Last 3 Encounters:  08/21/18 147 lb (66.7 kg)  08/14/18 148 lb (67.1 kg)  07/18/18 146 lb 6.4 oz (66.4 kg)     General appearance: Well-nourished Caucasian woman HENNT: Pharynx no erythema, exudate, mass, or ulcer. No thyromegaly or thyroid nodules Lymph nodes: No cervical, supraclavicular, or axillary lymphadenopathy Breasts:  Lungs: Clear to auscultation, resonant to percussion throughout Heart: Regular rhythm, no murmur, no gallop, no rub, no click, no edema Abdomen: Soft, nontender, normal bowel sounds, no mass, no organomegaly Extremities: No edema, no calf tenderness Musculoskeletal: no joint deformities GU:  Vascular: Carotid pulses 2+, no bruits,  Neurologic: Alert, oriented, PERRL, cranial nerves grossly normal, motor strength 5 over 5, reflexes 1+ symmetric, upper body coordination normal, gait normal, Skin: No rash or ecchymosis  Lab Results: CBC W/Diff    Component Value Date/Time   WBC 6.4 08/14/2018  1606   RBC 3.59 (L) 08/14/2018 1606   HGB 11.8 (L) 08/14/2018 1606   HGB 12.4 10/10/2016 0839   HGB 12.3 09/17/2012 1136    HCT 35.8 (L) 08/14/2018 1606   HCT 37.6 10/10/2016 0839   HCT 36.3 09/17/2012 1136   PLT 251.0 08/14/2018 1606   PLT 244 10/10/2016 0839   MCV 99.8 08/14/2018 1606   MCV 98 (H) 10/10/2016 0839   MCV 97.2 09/17/2012 1136   MCH 32.2 07/10/2018 0506   MCHC 32.9 08/14/2018 1606   RDW 13.7 08/14/2018 1606   RDW 14.9 10/10/2016 0839   RDW 15.0 (H) 09/17/2012 1136   LYMPHSABS 1.4 08/14/2018 1606   LYMPHSABS 1.3 10/10/2016 0839   LYMPHSABS 1.5 09/17/2012 1136   MONOABS 0.5 08/14/2018 1606   MONOABS 0.4 09/17/2012 1136   EOSABS 0.0 08/14/2018 1606   EOSABS 0.4 10/10/2016 0839   BASOSABS 0.1 08/14/2018 1606   BASOSABS 0.1 10/10/2016 0839   BASOSABS 0.1 09/17/2012 1136     Chemistry      Component Value Date/Time   NA 138 08/14/2018 1606   NA 141 10/10/2016 0839   NA 140 09/17/2012 1136   K 4.3 08/14/2018 1606   K 3.8 09/17/2012 1136   CL 103 08/14/2018 1606   CL 105 09/17/2012 1136   CO2 28 08/14/2018 1606   CO2 26 09/17/2012 1136   BUN 14 08/14/2018 1606   BUN 11 10/10/2016 0839   BUN 14.3 09/17/2012 1136   CREATININE 0.72 08/14/2018 1606   CREATININE 0.68 06/22/2017 1104   CREATININE 0.7 09/17/2012 1136      Component Value Date/Time   CALCIUM 9.2 08/14/2018 1606   CALCIUM 9.1 09/17/2012 1136   ALKPHOS 37 (L) 08/14/2018 1606   ALKPHOS 61 09/17/2012 1136   AST 13 08/14/2018 1606   AST 23 09/17/2012 1136   ALT 10 08/14/2018 1606   ALT 21 09/17/2012 1136   BILITOT 0.4 08/14/2018 1606   BILITOT 0.4 10/10/2016 0839   BILITOT 0.37 09/17/2012 1136     Ferritin: 18 on August 21, 2018  Radiological Studies: No results found.  Impression:  1.  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 I have been monitoring her ferritin levels on an annual basis.  She can be followed by her primary care physicians at this point and does not need formal hematology  follow-up.  2.  Paroxysmal atrial fibrillation on antiarrhythmic and anticoagulation  3.  Idiopathic pulmonary fibrosis/interstitial lung disease on chronic immunosuppressive agents  4.  Hypothyroid on replacement  CC: Patient Care Team: ViaLennette Bihari, MD as PCP - General (Family Medicine) Deboraha Sprang, MD as PCP - Cardiology (Cardiology) Annia Belt, MD as Consulting Physician (Hematology) Deboraha Sprang, MD as Consulting Physician (Cardiology) Richmond Campbell, MD as Consulting Physician (Gastroenterology)   Murriel Hopper, MD, [place]  Hematology-Oncology/Internal Medicine     2/26/20203:21 PM

## 2018-08-24 ENCOUNTER — Telehealth: Payer: Self-pay | Admitting: *Deleted

## 2018-08-24 NOTE — Telephone Encounter (Signed)
Pt called / informed " ferritin remains low at 18" per Dr Beryle Beams.

## 2018-08-24 NOTE — Telephone Encounter (Signed)
-----   Message from Annia Belt, MD sent at 08/22/2018  1:51 PM EST ----- Call pt: ferritin remains low at 18

## 2018-08-31 ENCOUNTER — Telehealth: Payer: Self-pay | Admitting: Internal Medicine

## 2018-08-31 MED ORDER — PREDNISONE 5 MG PO TABS: 5.0000 mg | ORAL_TABLET | Freq: Every day | ORAL | 1 refills | Status: DC

## 2018-08-31 NOTE — Telephone Encounter (Signed)
Interstitial pulmonary disease (HCC) Scl-70 antibody positive [name] phenomenon without gangrene High risk medication use Therapeutic drug monitoring  - ILD itself seems better  - reduce prednisone to 5 mg per day but continue myfortic and bactrim at current dose - do cbc, bmet, lft 08/14/2018  - out of proportion shortness of breath: monitor  _________________________________________________________________________________   [name] and spoke with patient she stated that her prednisone was never called in. Refill sent. Nothing further needed.

## 2018-09-04 ENCOUNTER — Other Ambulatory Visit: Payer: Self-pay | Admitting: Internal Medicine

## 2018-09-12 ENCOUNTER — Other Ambulatory Visit: Payer: Self-pay

## 2018-09-12 ENCOUNTER — Ambulatory Visit (INDEPENDENT_AMBULATORY_CARE_PROVIDER_SITE_OTHER): Payer: Medicare Other

## 2018-09-12 DIAGNOSIS — Z8701 Personal history of pneumonia (recurrent): Secondary | ICD-10-CM

## 2018-09-13 ENCOUNTER — Telehealth: Payer: Self-pay | Admitting: Hematology

## 2018-09-13 NOTE — Telephone Encounter (Signed)
Pt cld to r/s appt with Dr. Burr Medico to 4/17 at 1015am with labs at 945am.

## 2018-09-14 ENCOUNTER — Other Ambulatory Visit: Payer: Self-pay | Admitting: Internal Medicine

## 2018-09-14 NOTE — Telephone Encounter (Signed)
Please review for refill, Thanks !  

## 2018-09-17 ENCOUNTER — Other Ambulatory Visit: Payer: Medicare Other

## 2018-09-17 ENCOUNTER — Encounter: Payer: Medicare Other | Admitting: Hematology

## 2018-09-22 ENCOUNTER — Emergency Department (HOSPITAL_COMMUNITY): Payer: Medicare Other

## 2018-09-22 ENCOUNTER — Encounter (HOSPITAL_COMMUNITY): Payer: Self-pay

## 2018-09-22 ENCOUNTER — Other Ambulatory Visit: Payer: Self-pay

## 2018-09-22 ENCOUNTER — Emergency Department (HOSPITAL_COMMUNITY)
Admission: EM | Admit: 2018-09-22 | Discharge: 2018-09-22 | Disposition: A | Discharge status: Home / Self Care | Payer: Medicare Other | Attending: Emergency Medicine | Admitting: Emergency Medicine

## 2018-09-22 DIAGNOSIS — E039 Hypothyroidism, unspecified: Secondary | ICD-10-CM | POA: Diagnosis not present

## 2018-09-22 DIAGNOSIS — Z79899 Other long term (current) drug therapy: Secondary | ICD-10-CM | POA: Insufficient documentation

## 2018-09-22 DIAGNOSIS — R072 Precordial pain: Secondary | ICD-10-CM

## 2018-09-22 DIAGNOSIS — Z87891 Personal history of nicotine dependence: Secondary | ICD-10-CM | POA: Insufficient documentation

## 2018-09-22 DIAGNOSIS — R079 Chest pain, unspecified: Secondary | ICD-10-CM | POA: Diagnosis present

## 2018-09-22 DIAGNOSIS — I480 Paroxysmal atrial fibrillation: Secondary | ICD-10-CM | POA: Insufficient documentation

## 2018-09-22 DIAGNOSIS — I4891 Unspecified atrial fibrillation: Secondary | ICD-10-CM

## 2018-09-22 LAB — CBC
HCT: 38.6 % (ref 36.0–46.0)
Hemoglobin: 12 g/dL (ref 12.0–15.0)
MCH: 31.7 pg (ref 26.0–34.0)
MCHC: 31.1 g/dL (ref 30.0–36.0)
MCV: 101.8 fL — ABNORMAL HIGH (ref 80.0–100.0)
Platelets: 230 10*3/uL (ref 150–400)
RBC: 3.79 MIL/uL — AB (ref 3.87–5.11)
RDW: 13.4 % (ref 11.5–15.5)
WBC: 5.8 10*3/uL (ref 4.0–10.5)
nRBC: 0 % (ref 0.0–0.2)

## 2018-09-22 LAB — BASIC METABOLIC PANEL
Anion gap: 7 (ref 5–15)
BUN: 10 mg/dL (ref 8–23)
CO2: 22 mmol/L (ref 22–32)
Calcium: 8.7 mg/dL — ABNORMAL LOW (ref 8.9–10.3)
Chloride: 108 mmol/L (ref 98–111)
Creatinine, Ser: 0.75 mg/dL (ref 0.44–1.00)
GFR calc Af Amer: >60 mL/min (ref >60)
Glucose, Bld: 104 mg/dL — ABNORMAL HIGH (ref 70–99)
Potassium: 3.8 mmol/L (ref 3.5–5.1)
Sodium: 137 mmol/L (ref 135–145)

## 2018-09-22 LAB — TROPONIN I: Troponin I: <0.03 ng/mL (ref <0.03)

## 2018-09-22 MED ORDER — FENTANYL CITRATE (PF) 100 MCG/2ML IJ SOLN
100.0000 ug | Freq: Once | INTRAMUSCULAR | Status: DC
  Filled 2018-09-22: qty 2

## 2018-09-22 MED ORDER — MIDAZOLAM HCL 2 MG/2ML IJ SOLN
4.0000 mg | Freq: Once | INTRAMUSCULAR | Status: AC
  Administered 2018-09-22: 4 mg via INTRAVENOUS
  Filled 2018-09-22: qty 4

## 2018-09-22 MED ORDER — FENTANYL CITRATE (PF) 100 MCG/2ML IJ SOLN
50.0000 ug | Freq: Once | INTRAMUSCULAR | Status: AC
  Administered 2018-09-22: 50 ug via INTRAVENOUS
  Filled 2018-09-22: qty 2

## 2018-09-22 MED ORDER — RIVAROXABAN 20 MG PO TABS
20.0000 mg | ORAL_TABLET | Freq: Every day | ORAL | Status: AC
  Administered 2018-09-22: 20 mg via ORAL
  Filled 2018-09-22: qty 1

## 2018-09-22 NOTE — ED Triage Notes (Signed)
Pt arrived via [facility]; pt from h[facility] with c/o CP that woke her out of her sleep at approx 445a. Pt stated that pain radiates to L neck and jaw. Pt has hx of Afib; EMS reports Afib rate 160-180; pt rec'd 10mg  Metoprolol; 350mL NS bolus. Pt denied dizziness, n/v; pt states that she is having diarrhea but contributes to medication. Pt placed on 2L via [place] for comfort; 133/89, 137 HR, no CBG

## 2018-09-22 NOTE — Discharge Instructions (Signed)
Please read and follow all provided instructions.  Your diagnoses today include:  1. Atrial fibrillation with RVR ([place])   2. Precordial pain    Tests performed today include:  An EKG of your heart  A chest x-ray  Cardiac enzymes - a blood test for heart muscle damage  Blood counts and electrolytes  Vital signs. See below for your results today.   Medications prescribed:   None  Take any prescribed medications only as directed.  Follow-up instructions: Please follow-up with your primary care provider as soon as you can for further evaluation of your symptoms.   Return instructions:  SEEK IMMEDIATE MEDICAL ATTENTION IF:  You have severe chest pain, especially if the pain is crushing or pressure-like and spreads to the arms, back, neck, or jaw, or if you have sweating, nausea (feeling sick to your stomach), or shortness of breath. THIS IS AN EMERGENCY. Don't wait to see if the pain will go away. Get medical help at once. Call 911 or 0 (operator). DO NOT drive yourself to the hospital.   Your chest pain gets worse and does not go away with rest.   You have an attack of chest pain lasting longer than usual, despite rest and treatment with the medications your caregiver has prescribed.   You wake from sleep with chest pain or shortness of breath.  You feel dizzy or faint.  You have chest pain not typical of your usual pain for which you originally saw your caregiver.   You have any other emergent concerns regarding your health.  Additional Information: Chest pain comes from many different causes. Your caregiver has diagnosed you as having chest pain that is not specific for one problem, but does not require admission.  You are at low risk for an acute heart condition or other serious illness.   Your vital signs today were: BP 118/87    Pulse 84    Temp 97.7 F (36.5 C) (Oral)    Resp (!) 22    Ht 5\' 4"  (1.626 m)    Wt 66.7 kg    SpO2 100%    BMI 25.23 kg/m  If your blood  pressure (BP) was elevated above 135/85 this visit, please have this repeated by your doctor within one month. --------------

## 2018-09-22 NOTE — ED Notes (Signed)
Patient cardioverted at 150 J with MD Ray at bedside, immediate return to NSR at 85 BPM, new EKG captured.

## 2018-09-22 NOTE — ED Provider Notes (Signed)
Patient with known history of atrial fibrillation presents today with rapid heartbeat.  She has been n.p.o. since last night.  She is on her Xarelto. I have discussed sedation risks and cardioversion risks with patient. .Cardioversion Date/Time: 09/22/2018 8:42 AM Performed by: Pattricia Boss, MD Authorized by: Pattricia Boss, MD   Consent:    Consent obtained:  Verbal and written   Consent given by:  Patient   Risks discussed:  Cutaneous burn, death, induced arrhythmia and pain   Alternatives discussed:  Delayed treatment Pre-procedure details:    Cardioversion basis:  Emergent   Rhythm:  Atrial fibrillation   Electrode placement:  Anterior-posterior Patient sedated: Yes. Refer to sedation procedure documentation for details of sedation.  Attempt one:    Cardioversion mode:  Synchronous   Waveform:  Biphasic   Shock (Joules):  150   Shock outcome:  Conversion to normal sinus rhythm Post-procedure details:    Patient status:  Alert   Patient tolerance of procedure:  Tolerated well, no immediate complications .Sedation Date/Time: 09/22/2018 8:43 AM Performed by: Pattricia Boss, MD Authorized by: Pattricia Boss, MD   Consent:    Consent obtained:  Written   Consent given by:  Patient   Risks discussed:  Prolonged hypoxia resulting in organ damage, prolonged sedation necessitating reversal, respiratory compromise necessitating ventilatory assistance and intubation, nausea and vomiting Universal protocol:    Procedure explained and questions answered to patient or proxy's satisfaction: yes     Imaging studies available: yes     Immediately prior to procedure a time out was called: yes     Patient identity confirmation method:  Arm band, verbally with patient and hospital-assigned identification number Indications:    Procedure performed:  Cardioversion   Procedure necessitating sedation performed by:  Physician performing sedation Pre-sedation assessment:    Time since last food or  drink:  10   ASA classification: class 2 - patient with mild systemic disease     Neck mobility: normal     Mouth opening:  3 or more finger widths   Thyromental distance:  3 finger widths   Mallampati score:  I - soft palate, uvula, fauces, pillars visible   Pre-sedation assessments completed and reviewed: airway patency     Pre-sedation assessment completed:  09/22/2018 8:45 AM Immediate pre-procedure details:    Reassessment: Patient reassessed immediately prior to procedure     Reviewed: vital signs     Verified: bag valve mask available, emergency equipment available, oxygen available and suction available   Procedure details (see MAR for exact dosages):    Preoxygenation:  Nasal cannula   Sedation:  Midazolam   Analgesia:  Fentanyl   Intra-procedure monitoring:  Blood pressure monitoring, cardiac monitor, continuous pulse oximetry, frequent LOC assessments and frequent vital sign checks   Intra-procedure events: none     Intra-procedure management:  Supplemental oxygen   Total Provider sedation time (minutes):  20 Post-procedure details:    Post-sedation assessment completed:  09/22/2018 9:09 AM   Attendance: Constant attendance by certified staff until patient recovered     Recovery: Patient returned to pre-procedure baseline     Post-sedation assessments completed and reviewed: airway patency and cardiovascular function     Patient is stable for discharge or admission: yes     Patient tolerance:  Tolerated well, no immediate complications   Patient cardioverted with 150 J of synchronized return to normal sinus rhythm Awake and alert and symptoms resolved   Pattricia Boss, MD 09/22/18 [phone]

## 2018-09-22 NOTE — ED Provider Notes (Addendum)
[facility] EMERGENCY DEPARTMENT Provider Note   CSN: [id number] Arrival date & time: 09/22/18  0751    History   Chief Complaint Chief Complaint  Patient presents with  . Chest Pain    HPI [name] is a 78 y.o. female.     Patient with history of pulmonary fibrosis on immunosuppression, atrial fibrillation on flecainide and metoprolol presents the emergency department with complaints of chest pain and pressure that radiates to her left neck and jaw.  She also has a sensation of rapid heart rate.  This awoke her from sleep at approximately 4:45 AM.  Patient states that she had fallen asleep just prior to this due to taking care of her husband who had a recent surgery.  Patient was seen in the emergency department on 07/10/2018 and had electrical cardioversion performed in the emergency department.  She denies any lightheadedness, diaphoresis, vomiting, syncope.  Followed by Dr. Caryl Comes. HR 135-160.  EMS administered 10 mg of metoprolol and gave 300 cc normal saline.     Past Medical History:  Diagnosis Date  . Atrial fibrillation ([place])    a. Flecainide therapy;  b. event monitor 4/12  . Chest pain    a. GXT myoview 4/12: no isch., EF 86%;   b. echo 4/12: EF 55-65%, grade 1 diast dysfxn, LAE  . Depression with anxiety   . GERD (gastroesophageal reflux disease)   . 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 Beryle Beams  . History of syncope   . Hypothyroidism     Patient Active Problem List   Diagnosis Date Noted  . ILD (interstitial lung disease) ([place]) 11/28/2016  . Multiple lung nodules on CT 11/28/2016  . Abnormal laboratory test 11/18/2016  . DDD L spine 11/18/2016  . 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 History of hypothyroidism 11/10/2016  . History of hemochromatosis 11/10/2016  . Pulmonary emphysema ([place]) 08/31/2016  . [name] phenomenon without gangrene 08/31/2016  . Sinusitis, chronic 08/31/2016  . Chronic  cough 02/10/2016  . Irritable larynx 02/10/2016  . Stopped smoking with greater than 40 pack year history 02/10/2016  . Chest pain, unspecified 10/06/2010  . Shortness of breath 10/06/2010  . Hypothyroidism   . Hereditary hemochromatosis ([place]) 04/22/2010  . ATRIAL FIBRILLATION 10/20/2008  . [facility] 10/20/2008  . SYNCOPE AND COLLAPSE 10/20/2008    Past Surgical History:  Procedure Laterality Date  . MOUTH SURGERY    . SHOULDER ARTHROSCOPY Left   . TOTAL ABDOMINAL HYSTERECTOMY    . VARICOSE VEIN SURGERY       OB History   No obstetric history on file.      Home Medications    Prior to Admission medications   Medication Sig Start Date End Date Taking? Authorizing Provider  Biotin 1000 MCG tablet Take 1,000 mcg by mouth daily.    [provider]  Calcium Carbonate 1500 MG TABS Take 1 tablet by mouth 3 (three) times a week.    [provider]  Cholecalciferol (VITAMIN D PO) Take 5,000 Units by mouth daily.     [provider]  flecainide (TAMBOCOR) 100 MG tablet TAKE ONE-HALF TABLET BY  MOUTH IN THE MORNING AND  ONE-HALF TABLET IN THE  EVENING 09/05/18   Deboraha Sprang, MD  fluticasone [facility]) 50 MCG/ACT nasal spray Place 2 sprays into both nostrils daily. Patient not taking: Reported on 08/14/2018 08/31/16   Brand Males, MD  folic acid (FOLVITE) 1 MG tablet Take 1 mg  by mouth daily.      [provider]  gabapentin (NEURONTIN) 100 MG capsule Take 1 capsule (100 mg total) by mouth 3 (three) times daily. Patient not taking: Reported on 08/14/2018 12/16/17   Sherwood Gambler, MD  levothyroxine (SYNTHROID, LEVOTHROID) 112 MCG tablet Take 112 mcg by mouth daily.      [provider]  methocarbamol (ROBAXIN) 750 MG tablet Take 1 tablet (750 mg total) by mouth at bedtime as needed for muscle spasms. Patient not taking: Reported on 08/14/2018 04/12/17   Bo Merino, MD  metoprolol succinate (TOPROL-XL) 25 MG 24 hr tablet TAKE 1 TABLET BY  MOUTH  DAILY 05/29/17   Deboraha Sprang, MD  mycophenolate (MYFORTIC) 360 MG TBEC EC tablet TAKE 1 TABLET BY MOUTH TWICE DAILY 08/15/18   Brand Males, MD  omeprazole (PRILOSEC) 40 MG capsule Take 40 mg by mouth daily.    [provider]  ondansetron (ZOFRAN ODT) 4 MG disintegrating tablet Take 1-2 tablets three times daily as needed Patient not taking: Reported on 08/14/2018 09/22/17   Brand Males, MD  predniSONE (DELTASONE) 5 MG tablet Take 1 tablet (5 mg total) by mouth daily with breakfast. 08/31/18   Brand Males, MD  SPIRIVA RESPIMAT 2.5 MCG/ACT AERS INHALE 2 PUFFS INTO THE LUNGS DAILY 03/28/18   Brand Males, MD  sulfamethoxazole-trimethoprim (BACTRIM DS,SEPTRA DS) 800-160 MG tablet Take 1tablet Monday, 1tablet Wednesday, 1tablet Friday 08/16/17   Brand Males, MD  VITAMIN B1-B12 IM Inject 1,000 mcg into the muscle every 30 (thirty) days.     [provider]  XARELTO 20 MG TABS tablet TAKE 1 TABLET BY MOUTH ONCE DAILY WITH FOOD 09/14/18   Deboraha Sprang, MD    Family History Family History  Problem Relation Age of Onset  . Stroke Mother   . Heart disease Father 59  . Hemochromatosis Brother     Social History Social History   Tobacco Use  . Smoking status: Former Smoker    Packs/day: 1.00    Years: 42.00    Pack years: 42.00    Last attempt to quit: 06/27/1997    Years since quitting: 21.2  . Smokeless tobacco: Never Used  Substance Use Topics  . Alcohol use: Yes    Comment: occ  . Drug use: No     Allergies   Patient has no known allergies.   Review of Systems Review of Systems  Constitutional: Negative for diaphoresis and fever.  Eyes: Negative for redness.  Respiratory: Positive for shortness of breath. Negative for cough.   Cardiovascular: Positive for chest pain and palpitations. Negative for leg swelling.  Gastrointestinal: Negative for abdominal pain, nausea and vomiting.  Genitourinary: Negative for dysuria.   Musculoskeletal: Negative for back pain and neck pain.  Skin: Negative for rash.  Neurological: Negative for syncope and light-headedness.  Psychiatric/Behavioral: The patient is not nervous/anxious.      Physical Exam Updated Vital Signs Ht 5\' 4"  (1.626 m)   Wt 66.7 kg   BMI 25.23 kg/m   Physical Exam Vitals signs and nursing note reviewed.  Constitutional:      Appearance: She is well-developed. She is not diaphoretic.  HENT:     Head: Normocephalic and atraumatic.     Mouth/Throat:     Mouth: Mucous membranes are not dry.  Eyes:     Conjunctiva/sclera: Conjunctivae normal.  Neck:     Musculoskeletal: Normal range of motion and neck supple. No muscular tenderness.     Vascular: Normal carotid  pulses. No carotid bruit or JVD.     Trachea: Trachea normal. No tracheal deviation.  Cardiovascular:     Rate and Rhythm: Tachycardia present. Rhythm irregular.     Pulses: No decreased pulses.     Heart sounds: Normal heart sounds, S1 normal and S2 normal. No murmur.  Pulmonary:     Effort: Pulmonary effort is normal. No respiratory distress.     Breath sounds: No wheezing.  Chest:     Chest wall: No tenderness.  Abdominal:     General: Bowel sounds are normal.     Palpations: Abdomen is soft.     Tenderness: There is no abdominal tenderness. There is no guarding or rebound.  Musculoskeletal: Normal range of motion.  Skin:    General: Skin is warm and dry.     Coloration: Skin is not pale.  Neurological:     Mental Status: She is alert.      ED Treatments / Results  Labs (all labs ordered are listed, but only abnormal results are displayed) Labs Reviewed  CBC - Abnormal; Notable for the following components:      Result Value   RBC 3.79 (*)    MCV 101.8 (*)    All other components within normal limits  BASIC METABOLIC PANEL - Abnormal; Notable for the following components:   Glucose, Bld 104 (*)    Calcium 8.7 (*)    All other components within normal limits   TROPONIN I    ED ECG REPORT   Date: 09/22/2018  Rate: 155  Rhythm: atrial fibrillation  QRS Axis: normal  Intervals: normal  ST/T Wave abnormalities: nonspecific ST changes  Conduction Disutrbances:none  Narrative Interpretation:   Old EKG Reviewed: changes noted  I have personally reviewed the EKG tracing and agree with the computerized printout as noted.   EKG EKG Interpretation  Date/Time:  Saturday September 22 2018 09:01:27 EDT Ventricular Rate:  85 PR Interval:    QRS Duration: 96 QT Interval:  343 QTC Calculation: 408 R Axis:   -23 Text Interpretation:  Normal sinus rhythm No significant change since EKG of 20 May 2013 Confirmed by Pattricia Boss [phone]) on 09/22/2018 10:53:16 AM   Radiology Dg Chest Portable 1 View  Result Date: 09/22/2018 CLINICAL DATA:  Chest pain, rapid AFib EXAM: PORTABLE CHEST 1 VIEW COMPARISON:  09/12/2018 FINDINGS: Lungs are clear.  No pleural effusion or pneumothorax. The heart is normal in size.  Thoracic aortic atherosclerosis. IMPRESSION: No evidence of acute cardiopulmonary disease. Electronically Signed   By: Julian Hy M.D.   On: 09/22/2018 09:01    Procedures Procedures (including critical care time)  Medications Ordered in ED Medications  rivaroxaban (XARELTO) tablet 20 mg (has no administration in time range)  fentaNYL (SUBLIMAZE) injection 50 mcg (50 mcg Intravenous Given 09/22/18 0858)  midazolam (VERSED) injection 4 mg (4 mg Intravenous Given 09/22/18 0858)     Initial Impression / Assessment and Plan / ED Course  I have reviewed the triage vital signs and the nursing notes.  Pertinent labs & imaging results that were available during my care of the patient were reviewed by me and considered in my medical decision making (see chart for details).        Patient seen and examined. Work-up initiated. Medications ordered.   Vital signs reviewed and are as follows: BP 114/87   Temp 97.7 F (36.5 C) (Oral)    Resp 20   Ht 5\' 4"  (1.626 m)   Wt 66.7 kg  SpO2 95%   BMI 25.23 kg/m   9:02 AM Pt in NSR after DC cardioversion. Please see Dr. Maretta Bees note.   9:29 AM Pt doing well s/p cardioversion. States she is pain free. Will have her drink some water. AM Xarelto dose ordered.   10:54 AM PT doing well. Ready for d/c. Note sent to Dr. Caryl Comes.   11:00 AM Patient was counseled to return with severe chest pain, especially if the pain is crushing or pressure-like and spreads to the arms, back, neck, or jaw, or if they have sweating, nausea, or shortness of breath with the pain. They were encouraged to call 911 with these symptoms.   The patient verbalized understanding and agreed.    CHA2DS2/VAS Stroke Risk Points  Current as of 12 minutes ago     3 >= 2 Points: High Risk  1 - 1.99 Points: Medium Risk  0 Points: Low Risk    This is the only CHA2DS2/VAS Stroke Risk Points available for the past  year.:  Last Change: N/A     Details    This score determines the patient's risk of having a stroke if the  patient has atrial fibrillation.       Points Metrics  0 Has Congestive Heart Failure:  No    Current as of 12 minutes ago  0 Has Vascular Disease:  No    Current as of 12 minutes ago  0 Has Hypertension:  No    Current as of 12 minutes ago  2 Age:  21    Current as of 12 minutes ago  0 Has Diabetes:  No    Current as of 12 minutes ago  0 Had Stroke:  No  Had TIA:  No  Had thromboembolism:  No    Current as of 12 minutes ago  1 Female:  Yes    Current as of 12 minutes ago    Patient previously anticoagulated.     CRITICAL CARE Performed by: Carlisle Cater PA-C Total critical care time: 35 minutes Critical care time was exclusive of separately billable procedures and treating other patients. Critical care was necessary to treat or prevent imminent or life-threatening deterioration. Critical care was time spent personally by me on the following activities: development of treatment plan  with patient and/or surrogate as well as nursing, discussions with consultants, evaluation of patient's response to treatment, examination of patient, obtaining history from patient or surrogate, ordering and performing treatments and interventions, ordering and review of laboratory studies, ordering and review of radiographic studies, pulse oximetry and re-evaluation of patient's condition.  Final Clinical Impressions(s) / ED Diagnoses   Final diagnoses:  Atrial fibrillation with RVR (HCC)  Precordial pain   Patient presented with A. fib with rapid ventricular rate and associated chest pain.  She has been anticoagulated and has not missed any doses.  Lab work is reassuring.  Patient was deemed to be an appropriate candidate for electrical cardioversion in the emergency department.  Procedure performed and patient did well.  She will be discharged home with continued follow-up by her cardiologist.  ED Discharge Orders    None         Carlisle Cater, PA-C 09/22/18 1115    Pattricia Boss, MD 09/22/18 [phone]

## 2018-09-24 ENCOUNTER — Telehealth: Payer: Self-pay

## 2018-09-24 ENCOUNTER — Telehealth (HOSPITAL_COMMUNITY): Payer: Self-pay | Admitting: *Deleted

## 2018-09-24 NOTE — Telephone Encounter (Signed)
Spoke with pt regarding message from ED PA. She agrees to have a virtual visit with Dr Caryl Comes on 4/1 @1100 . She understands we will be calling her to set up MyChart and send consent. She agrees to allow billing to her insurance company as well. She has no additional questions.

## 2018-09-24 NOTE — Telephone Encounter (Signed)
-----   Message from Deboraha Sprang, MD sent at 09/24/2018  2:42 PM EDT ----- Rolanda Lundborg  maybe we can get her in for telehealth to talk about upping her flecainide  thx   Richardson Landry  ----- Message ----- From: Carlisle Cater, Hershal Coria Sent: 09/22/2018  10:56 AM EDT To: Deboraha Sprang, MD  Dr. Caryl Comes,   Sian Rockers was here in the ED this morning with rapid afib and chest pain. She was electrically cardioverted successfully and did well after the procedure.  She will continue on her current flecainide, metoprolol and Xarelto. This is her second visit for the same since January. I just wanted to keep you in the loop.  Thanks! Josh Geiple PA-C

## 2018-09-24 NOTE — Telephone Encounter (Signed)
Pt dccv in ED. Pt feels well and reports that she has her medications and is taking them appropriately.  She would like to call Dr. Aquilla Hacker office and discuss if any med changes should take place since this is the 2nd time she's gone out of rhythm on current meds, but overall feels well and declines appt with our office at this time.  She was offered virtual or phone visit to limit exposure but she gratefully declined these as she is doing well.   Pt thanks [name] for following up with her and will call if she has any changes.

## 2018-09-26 ENCOUNTER — Telehealth: Payer: Self-pay | Admitting: Hematology

## 2018-09-26 ENCOUNTER — Telehealth (INDEPENDENT_AMBULATORY_CARE_PROVIDER_SITE_OTHER): Payer: Medicare Other | Admitting: Internal Medicine

## 2018-09-26 ENCOUNTER — Telehealth: Payer: Self-pay | Admitting: Internal Medicine

## 2018-09-26 ENCOUNTER — Other Ambulatory Visit: Payer: Self-pay

## 2018-09-26 VITALS — BP 122/67 | HR 65 | Ht 64.0 in | Wt 147.0 lb

## 2018-09-26 DIAGNOSIS — I480 Paroxysmal atrial fibrillation: Secondary | ICD-10-CM

## 2018-09-26 DIAGNOSIS — Z79899 Other long term (current) drug therapy: Secondary | ICD-10-CM

## 2018-09-26 DIAGNOSIS — I7300 Raynaud's syndrome without gangrene: Secondary | ICD-10-CM

## 2018-09-26 MED ORDER — FLECAINIDE ACETATE 100 MG PO TABS: 100.0000 mg | ORAL_TABLET | Freq: Two times a day (BID) | ORAL | 3 refills | Status: DC

## 2018-09-26 NOTE — Progress Notes (Signed)
Electrophysiology TeleHealth Note   Due to national recommendations of social distancing due to COVID 19, an audio/video telehealth visit is felt to be most appropriate for this patient at this time.  See MyChart message from today for the patient's consent to telehealth for [facility].   Date:  09/26/2018   ID:  [name], DOB 1940-09-28, MRN [id number]  Location: patient's home  Provider location: [address], [place]  Evaluation Performed: Follow-up visit  PCP:  ViaLennette Bihari, MD  Cardiologist:  Virl Axe, MD   Electrophysiologist:  None   Chief Complaint:  Atrial fib  History of Present Illness:    [name] is a 78 y.o. female who presents via audio/video conferencing for a telehealth visit today.  Since last being seen in our clinic, the patient reports The patient denies chest pain,change from baseline shortness of breath, nocturnal dyspnea, orthopnea or peripheral edema.  There have been no palpitations, lightheadedness or syncope apart from interval afib events See Below   Has been followed for atrial fibrillation for which she has been taking flecainide 50 mg twice daily.   This occurs  in the context of hemochromatosis,  identified by the C282Y gene mutation for which she is homozygous; all three daughters are homozygous. Previously followed for this by Dr. Beryle Beams.     Normal nuclear study 4/15  Date Cr K Hgb  4/18 0.74  12.4  1/20 0.78 3.9 11.4  3/28 0.75 3.8 12.0    DATE PR interval QRSduration Dose  1/20 164 78 50  3/20 162 92 50   Seen in the ER 1/20, 3/20 for atrial fibrillation with a rapid rate associated with chest pain and dyspnea.-- both occasions  Cardioverted and discharged.  Chest x-ray had demonstrated pulmonary infiltrate.  inteval CXR >> full resolution    The patient denies symptoms of fevers, chills, cough, or new SOB worrisome for COVID 19.   Past Medical History:  Diagnosis Date  . Atrial  fibrillation ([place])    a. Flecainide therapy;  b. event monitor 4/12  . Chest pain    a. GXT myoview 4/12: no isch., EF 86%;   b. echo 4/12: EF 55-65%, grade 1 diast dysfxn, LAE  . Depression with anxiety   . GERD (gastroesophageal reflux disease)   . 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 Beryle Beams  . History of syncope   . Hypothyroidism     Past Surgical History:  Procedure Laterality Date  . MOUTH SURGERY    . SHOULDER ARTHROSCOPY Left   . TOTAL ABDOMINAL HYSTERECTOMY    . VARICOSE VEIN SURGERY      Current Outpatient Medications  Medication Sig Dispense Refill  . acetaminophen (TYLENOL) 325 MG tablet Take 650 mg by mouth every 6 (six) hours as needed for mild pain or headache.    . Biotin 1000 MCG tablet Take 1,000 mcg by mouth daily.    . Calcium Carbonate 1500 MG TABS Take 1 tablet by mouth 3 (three) times a week.    . Cholecalciferol (VITAMIN D PO) Take 5,000 Units by mouth daily.     . flecainide (TAMBOCOR) 100 MG tablet TAKE ONE-HALF TABLET BY  MOUTH IN THE MORNING AND  ONE-HALF TABLET IN THE  EVENING (Patient taking differently: Take 50 mg by mouth 2 (two) times daily. ) 90 tablet 3  . fluticasone (FLONASE) 50 MCG/ACT nasal spray Place 2 sprays into both nostrils daily. (Patient taking  differently: Place 2 sprays into both nostrils daily as needed for allergies. ) 16 g 5  . folic acid (FOLVITE) 1 MG tablet Take 1 mg by mouth daily.      Marland Kitchen levothyroxine (SYNTHROID, LEVOTHROID) 112 MCG tablet Take 112 mcg by mouth daily.      . methocarbamol (ROBAXIN) 750 MG tablet Take 1 tablet (750 mg total) by mouth at bedtime as needed for muscle spasms. (Patient taking differently: Take 750 mg by mouth daily as needed for muscle spasms. ) 30 tablet 2  . metoprolol succinate (TOPROL-XL) 25 MG 24 hr tablet TAKE 1 TABLET BY MOUTH  DAILY (Patient taking differently: Take 25 mg by mouth daily. ) 90 tablet 2  . mycophenolate (MYFORTIC) 360 MG TBEC EC tablet TAKE 1  TABLET BY MOUTH TWICE DAILY (Patient taking differently: Take 360 mg by mouth 2 (two) times daily. ) 180 tablet 2  . omeprazole (PRILOSEC) 40 MG capsule Take 40 mg by mouth daily.    . ondansetron (ZOFRAN ODT) 4 MG disintegrating tablet Take 1-2 tablets three times daily as needed (Patient taking differently: Take 4 mg by mouth every 8 (eight) hours as needed for nausea or vomiting. ) 42 tablet 1  . predniSONE (DELTASONE) 5 MG tablet Take 1 tablet (5 mg total) by mouth daily with breakfast. 30 tablet 1  . SPIRIVA RESPIMAT 2.5 MCG/ACT AERS INHALE 2 PUFFS INTO THE LUNGS DAILY (Patient taking differently: Inhale 2 puffs into the lungs daily. ) 4 g 11  . sulfamethoxazole-trimethoprim (BACTRIM DS,SEPTRA DS) 800-160 MG tablet Take 1tablet Monday, 1tablet Wednesday, 1tablet Friday (Patient taking differently: Take 1 tablet by mouth See admin instructions. Take 1 tablet (800/160 mg totally) by mouth on Monday, Wednesday, and Friday) 60 tablet 5  . VITAMIN B1-B12 IM Inject 1,000 mcg into the muscle every 30 (thirty) days.     Alveda Reasons 20 MG TABS tablet TAKE 1 TABLET BY MOUTH ONCE DAILY WITH FOOD (Patient taking differently: Take 20 mg by mouth daily with supper. ) 90 tablet 2   No current facility-administered medications for this visit.     Allergies:   Patient has no known allergies.   Social History:  The patient  reports that she quit smoking about 21 years ago. She has a 42.00 pack-year smoking history. She has never used smokeless tobacco. She reports current alcohol use. She reports that she does not use drugs.   Family History:  The patient's   family history includes Heart disease (age of onset: 34) in her father; Hemochromatosis in her brother; Stroke in her mother.   ROS:  Please see the history of present illness.   All other systems are personally reviewed and negative.    Exam:    Vital Signs:  BP 122/67   Pulse 65   Ht 5\' 4"  (1.626 m)   Wt 147 lb (66.7 kg)   SpO2 96%   BMI 25.23  kg/m     Well appearing, alert and conversant, regular work of breathing,  good skin color Eyes- anicteric, neuro- grossly intact, skin- no apparent rash or lesions or cyanosis, mouth- oral mucosa is pink   Labs/Other Tests and Data Reviewed:    Recent Labs: 08/14/2018: ALT 10 09/22/2018: BUN 10; Creatinine, Ser 0.75; Hemoglobin 12.0; Platelets 230; Potassium 3.8; Sodium 137   Wt Readings from Last 3 Encounters:  09/26/18 147 lb (66.7 kg)  09/22/18 147 lb (66.7 kg)  08/21/18 147 lb (66.7 kg)     Other studies  personally reviewed: Additional studies/ records that were reviewed today include: CXR and ER records  Review of the above records today demonstrates: As above  Prior radiographs: As above        ASSESSMENT & PLAN:    Atrial Fibrillation :    Raynauds  Pulmonary fibrosis  Hemochromatosis   Intermittent Afib with increasing frequency and assoc with significant symptoms which have necessitated ER and DCCV   Have discussed increasing flecainide--she asked about another drug  Has been on flec for years and years ?20>> so decided for now to increase to 100 bid  Will need QRS measurement   Had originally suggested ECG but will use family ALIVECOR monitor and send in a baseline and a new strip in 2 weeks to rreview QRS duration    COVID 19 screen The patient denies symptoms of COVID 19 at this time.  The importance of social distancing was discussed today.  Follow-up: 6 m   Current medicines are reviewed at length with the patient today.   The patient has concerns regarding her medicines.  The following changes were made today:  As above   Labs/ tests ordered today include:   No orders of the defined types were placed in this encounter.   Future tests ( post COVID )     Patient Risk:  after full review of this patients clinical status, I feel that they are at moderate risk at this time.  Today, I have spent  28 minutes with the patient with telehealth  technology discussing the above.  Signed, Virl Axe, MD  09/26/2018 11:12 AM     [facility] [address] [place] [address] [phone] (office) [phone] (fax)

## 2018-09-26 NOTE — Telephone Encounter (Signed)
Pt calling to let us know MyChart is not working properly and will be back up in 24hrs per IT. She will be able to upload her EKG tomorrow. We reviewed her after visit summary and verbalized understanding of medication changes and follow up. She had no additional questions.

## 2018-09-26 NOTE — Telephone Encounter (Signed)
Per MD pt should be followed by her PCP. Pt has be notified.

## 2018-09-26 NOTE — Telephone Encounter (Signed)
Follow up: ° ° °Patient returning call back. Please call patient. °

## 2018-10-12 ENCOUNTER — Encounter: Payer: Medicare Other | Admitting: Hematology

## 2018-10-12 ENCOUNTER — Other Ambulatory Visit: Payer: Medicare Other

## 2018-10-26 ENCOUNTER — Telehealth: Payer: Self-pay | Admitting: Internal Medicine

## 2018-10-26 NOTE — Telephone Encounter (Signed)
I don't see a clearance form entered in the patient's chart or sent to the pool. Please clarify and enter a clearance form if needed.

## 2018-10-26 NOTE — Telephone Encounter (Signed)
[name] is calling stating that a medical clearance form was faxed over last week and she would like to make sure it was received and get an update. Procedure is scheduled for 10/30/18. Please let her know if it has not been received.

## 2018-10-27 ENCOUNTER — Other Ambulatory Visit: Payer: Self-pay | Admitting: Internal Medicine

## 2018-10-29 ENCOUNTER — Telehealth: Payer: Self-pay | Admitting: Internal Medicine

## 2018-10-29 MED ORDER — SULFAMETHOXAZOLE-TRIMETHOPRIM 800-160 MG PO TABS: ORAL_TABLET | ORAL | 5 refills | Status: DC

## 2018-10-29 NOTE — Telephone Encounter (Signed)
   Primary Cardiologist: Virl Axe, MD  Chart reviewed as part of pre-operative protocol coverage. Patient was contacted 10/29/2018 in reference to pre-operative risk assessment for pending surgery as outlined below.  [name] was last seen on 09/26/18 by Dr. Caryl Comes.  Since that day, [name] has done well.  She denies anginal complaints and can complete more than 4.0 METS. She is doing well on flecainide, last Kardia reviewed by Dr. Caryl Comes. She states she will upload an updated Kardia for Dr. Caryl Comes to review.  Per our pharmacy staff:  Pt takes Xarelto for afib with CHADS2VASc score of 3 (age x2, sex). Renal function is normal. Ok to hold Xarelto for 2 days prior to procedure  Therefore, based on ACC/AHA guidelines, the patient would be at acceptable risk for the planned procedure without further cardiovascular testing.   I will route this recommendation to the requesting party via Epic fax function and remove from pre-op pool.  Please call with questions.  Tami Lin Pecolia Marando, PA 10/29/2018, 12:21 PM

## 2018-10-29 NOTE — Telephone Encounter (Signed)
Refill of both bactrim and prednisone have been sent to pt's preferred pharmacy. Called and spoke with pt letting her know this had been done. Pt expressed understanding. Nothing further needed.

## 2018-10-29 NOTE — Telephone Encounter (Signed)
Pt takes Xarelto for afib with CHADS2VASc score of 3 (age x2, sex). Renal function is normal. Ok to hold Xarelto for 2 days prior to procedure.

## 2018-10-29 NOTE — Telephone Encounter (Signed)
Please comment on anticoagulation

## 2018-10-29 NOTE — Telephone Encounter (Signed)
I called and s/w  [name] at Dr. Liliane [name] office. I gathered the information needed for Pre Op. [name] did also state the procedure is not on 10/30/18 but 11/06/18. I will put in surg clearance and route to Pre Op. [name] thanked me for my help today.     [facility] Pre-operative Risk Assessment    Request for surgical clearance:  1. What type of surgery is being performed? COLONOSCOPY/ENDOSCOPY  2. When is this surgery scheduled? 11/06/18  3.What type of clearance is required (medical clearance vs. Pharmacy clearance to hold med vs. Both)? BOTH  4. Are there any medications that need to be held prior to surgery and how long? [name]  5. Practice name and name of physician performing surgery? [facility]; DR. MEDOFF  6. What is your office phone number [phone]   7.   What is your office fax number [phone]  8.   Anesthesia type (None, local, MAC, general) ? PROPOFOL   [name] 10/29/2018, 9:40 AM  _________________________________________________________________   (provider comments below)

## 2018-11-08 ENCOUNTER — Ambulatory Visit: Payer: Medicare Other | Admitting: Internal Medicine

## 2018-11-21 NOTE — Progress Notes (Signed)
Office Visit Note  Patient: [name]             Date of Birth: [birth year]           MRN: [id number]             PCP: Dineen Kid, MD Referring: Chesley Noon, MD Visit Date: 12/04/2018 Occupation: @GUAROCC @  Subjective:  Skin lesions     History of Present Illness: [name] is a 78 y.o. female with history of [name], ILD, DDD, and osteoarthritis.  She is taking Myfortic 360 mg BID and prednisone 5 mg po daily prescribed by Dr. Chase Caller.  She was evaluated by Dr. Chase Caller on 08/14/2018, and at that visit he reduced her prednisone from 10 mg to 5 mg po daily due to improvement in ILD on CT and PFTs.  She denies any new or worsening symptoms since reducing prednisone.  She has been walking on the treadmill daily for the past 3 months.  She has had some muscle aches in her lower extremities when walking for prolonged distances. She denies any muscle weakness. She denies any joint pain or joint swelling at this time.  She continues have intermittent symptoms of [name] in her fingertips but denies any digital ulcerations or signs of gangrene.  She states she has noticed skin lesions on her torso and lower extremities, which resemble warts.  She states she has not been evaluated by her dermatologist yet.  She is concerned that being on an immunosuppressive medication is making her more susceptible to warts.   Activities of Daily Living:  Patient reports morning stiffness for 0 minute.   Patient Denies nocturnal pain.  Difficulty dressing/grooming: Denies Difficulty climbing stairs: Denies Difficulty getting out of chair: Denies Difficulty using hands for taps, buttons, cutlery, and/or writing: Denies  Review of Systems  Constitutional: Negative for fatigue.  HENT: Negative for mouth sores, mouth dryness and nose dryness.   Eyes: Negative for pain, visual disturbance and dryness.  Respiratory: Negative for cough, hemoptysis, shortness of breath, wheezing and  difficulty breathing.   Cardiovascular: Negative for chest pain, palpitations, hypertension and swelling in legs/feet.  Gastrointestinal: Negative for abdominal pain, blood in stool, constipation and diarrhea.  Endocrine: Negative for excessive hunger and increased urination.  Genitourinary: Negative for difficulty urinating and painful urination.  Musculoskeletal: Negative for arthralgias, joint pain, joint swelling, myalgias, muscle weakness, morning stiffness, muscle tenderness and myalgias.  Skin: Negative for color change, pallor, rash, hair loss, nodules/bumps, redness, skin tightness, ulcers and sensitivity to sunlight.  Allergic/Immunologic: Negative for susceptible to infections.  Neurological: Negative for dizziness, numbness, headaches, memory loss and weakness.  Hematological: Positive for bruising/bleeding tendency. Negative for swollen glands.  Psychiatric/Behavioral: Negative for depressed mood and sleep disturbance. The patient is not nervous/anxious.     PMFS History:  Patient Active Problem List   Diagnosis Date Noted  . ILD (interstitial lung disease) ([place]) 11/28/2016  . Multiple lung nodules on CT 11/28/2016  . Abnormal laboratory test 11/18/2016  . DDD L spine 11/18/2016  . 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 History of hypothyroidism 11/10/2016  . History of hemochromatosis 11/10/2016  . Pulmonary emphysema ([place]) 08/31/2016  . [name] phenomenon without gangrene 08/31/2016  . Sinusitis, chronic 08/31/2016  . Chronic cough 02/10/2016  . Irritable larynx 02/10/2016  . Stopped smoking with greater than 40 pack year history 02/10/2016  . Chest pain, unspecified 10/06/2010  . Shortness of breath 10/06/2010  . Hypothyroidism   . Hereditary hemochromatosis ([place])  04/22/2010  . ATRIAL FIBRILLATION 10/20/2008  . [facility] 10/20/2008  . SYNCOPE AND COLLAPSE 10/20/2008    Past Medical History:  Diagnosis Date  . Atrial fibrillation ([place])    a. Flecainide therapy;  b.  event monitor 4/12  . Chest pain    a. GXT myoview 4/12: no isch., EF 86%;   b. echo 4/12: EF 55-65%, grade 1 diast dysfxn, LAE  . Depression with anxiety   . GERD (gastroesophageal reflux disease)   . 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 Beryle Beams  . History of syncope   . Hypothyroidism     Family History  Problem Relation Age of Onset  . Stroke Mother   . Heart disease Father 16  . Hemochromatosis Brother    Past Surgical History:  Procedure Laterality Date  . MOUTH SURGERY    . SHOULDER ARTHROSCOPY Left   . TOTAL ABDOMINAL HYSTERECTOMY    . VARICOSE VEIN SURGERY     Social History   Social History Narrative   REGULAR EXERCISE   Immunization History  Administered Date(s) Administered  . Influenza, High Dose Seasonal PF 08/29/2017, 04/26/2018  . Pneumococcal Conjugate-13 06/12/2014  . Pneumococcal Polysaccharide-23 06/27/2010  . Tdap 03/16/2011  . Zoster 06/27/2010     Objective: Vital Signs: BP 109/61 (BP Location: Left Arm, Patient Position: Sitting, Cuff Size: Normal)   Pulse (!) 53   Resp 12   Ht 5\' 4"  (1.626 m)   Wt 146 lb (66.2 kg)   BMI 25.06 kg/m    Physical Exam Vitals signs and nursing note reviewed.  Constitutional:      Appearance: She is well-developed.  HENT:     Head: Normocephalic and atraumatic.  Eyes:     Conjunctiva/sclera: Conjunctivae normal.  Neck:     Musculoskeletal: Normal range of motion.  Cardiovascular:     Rate and Rhythm: Normal rate and regular rhythm.     Heart sounds: Normal heart sounds.  Pulmonary:     Effort: Pulmonary effort is normal.     Breath sounds: Normal breath sounds.  Abdominal:     General: Bowel sounds are normal.     Palpations: Abdomen is soft.  Lymphadenopathy:     Cervical: No cervical adenopathy.  Skin:    General: Skin is warm and dry.     Capillary Refill: Capillary refill takes less than 2 seconds.  Neurological:     Mental Status: She is alert and oriented to  person, place, and time.  Psychiatric:        Behavior: Behavior normal.      Musculoskeletal Exam: C-spine, thoracic spine, and lumbar spine good ROM.  No midline spinal tenderness.  No SI joint tenderness.  Shoulder joints, elbow joints, wrist joints, MCPs, PIPs, and DIPs good ROM with no synovitis. Complete fist formation bilaterally.  Hip joints, knee joints, ankle joints, MTPs, PIPs, and DIPs good ROM with no synovitis.  No warmth or effusion of knee joints.  No tenderness or swelling of ankle joints.  No tenderness over trochanteric bursa bilaterally.   CDAI Exam: CDAI Score: Not documented Patient Global Assessment: Not documented; Provider Global Assessment: Not documented Swollen: Not documented; Tender: Not documented Joint Exam   Not documented   There is currently no information documented on the homunculus. Go to the Rheumatology activity and complete the homunculus joint exam.  Investigation: No additional findings.  Imaging: No results found.  Recent Labs: Lab Results  Component Value Date   WBC  5.8 09/22/2018   HGB 12.0 09/22/2018   PLT 230 09/22/2018   NA 137 09/22/2018   K 3.8 09/22/2018   CL 108 09/22/2018   CO2 22 09/22/2018   GLUCOSE 104 (H) 09/22/2018   BUN 10 09/22/2018   CREATININE 0.75 09/22/2018   BILITOT 0.4 08/14/2018   ALKPHOS 37 (L) 08/14/2018   AST 13 08/14/2018   ALT 10 08/14/2018   PROT 6.5 08/14/2018   ALBUMIN 4.2 08/14/2018   CALCIUM 8.7 (L) 09/22/2018   GFRAA >60 09/22/2018   QFTBGOLDPLUS NEGATIVE 08/10/2017    Speciality Comments: No specialty comments available.  Procedures:  No procedures performed Allergies: Patient has no known allergies.    Assessment / Plan:     Visit Diagnoses: [name] phenomenon without gangrene: She continues have intermittent symptoms of [name] in bilateral hands.  She has no digital ulcerations or signs of gangrene.  She has agreed to give her body temperature warm and wear gloves on a  regular basis.  She has not noticed any increase in tightness or skin thickening.  Abnormal laboratory test - Positive anticardiolipin IgM 47, positive beta-2 IgM 31: The labs done last year were stable.  She is taking Xarelto 20 mg 1 tablet by mouth daily.   ILD (interstitial lung disease) ([place]) - Scl-70+. She had an appointment on 08/14/18 with Dr. Chase Caller.  She has been taking Myfortic, Bactrim, and prednisone 10 mg po daily since March 2019.  According to his note, the high resolution CT showed improvement in ILD compared to 1 year ago. PFTs showed stability/improvement.  She was advised to reduce prednisone from 10 mg po daily to 5 mg po daily.  She has not developed any new or worsening symptoms since reducing prednisone.  She will continue to follow up with Dr. Chase Caller every 3 months.   High risk medication use - She is on Myfortic 360 mg twice daily and prednisone 5 mg daily prescribed by Dr. Chase Caller.  Last TB gold negative on 08/10/2017.  Most recent BMP within normal limits except low calcium and CBC within normal limits except for low RBC on 09/22/2018.  Most recent hepatic panel within normal limits on 11/30/2017.  DDD (degenerative disc disease), lumbar: She has on lower back pain at this time.  No midline spinal tenderness.  She has good ROM with no discomfort.   Primary osteoarthritis of both hands: She has mild PIP and DIP synovial thickening consistent with osteoarthritis of both hands.  She has no synovitis or tenderness on exam.  She has complete fist formation bilaterally.   Synovial cyst of left popliteal space: Resolved  History of hemochromatosis - Followed up by hematology yearly.   Other medical conditions are listed as follows:  Multiple lung nodules on CT  History of emphysema  History of hypothyroidism  History of atrial fibrillation  Former smoker   Orders: No orders of the defined types were placed in this encounter.  No orders of the defined types were  placed in this encounter.   Face-to-face time spent with patient was 30 minutes. Greater than 50% of time was spent in counseling and coordination of care.  Follow-Up Instructions: Return in about 6 months (around 06/05/2019) for [name], ILD, Osteoarthritis, DDD.   Ofilia Neas, PA-C   I examined and evaluated the patient with Tara Sams PA.  Patient is clinically doing well.  She has been followed by Dr. Chase Caller for ILD.  I do not see any synovitis on my examination.  The  plan of care was discussed as noted above.  Bo Merino, MD  Note - This record has been created using Editor, commissioning.  Chart creation errors have been sought, but may not always  have been located. Such creation errors do not reflect on  the standard of medical care.

## 2018-12-04 ENCOUNTER — Other Ambulatory Visit: Payer: Self-pay

## 2018-12-04 ENCOUNTER — Encounter: Payer: Self-pay | Admitting: Rheumatology

## 2018-12-04 ENCOUNTER — Ambulatory Visit (INDEPENDENT_AMBULATORY_CARE_PROVIDER_SITE_OTHER): Payer: Medicare Other | Admitting: Rheumatology

## 2018-12-04 VITALS — BP 109/61 | HR 53 | Resp 12 | Ht 64.0 in | Wt 146.0 lb

## 2018-12-04 DIAGNOSIS — Z8679 Personal history of other diseases of the circulatory system: Secondary | ICD-10-CM

## 2018-12-04 DIAGNOSIS — M19042 Primary osteoarthritis, left hand: Secondary | ICD-10-CM

## 2018-12-04 DIAGNOSIS — J849 Interstitial pulmonary disease, unspecified: Secondary | ICD-10-CM

## 2018-12-04 DIAGNOSIS — Z87891 Personal history of nicotine dependence: Secondary | ICD-10-CM

## 2018-12-04 DIAGNOSIS — R918 Other nonspecific abnormal finding of lung field: Secondary | ICD-10-CM

## 2018-12-04 DIAGNOSIS — I7300 Raynaud's syndrome without gangrene: Secondary | ICD-10-CM

## 2018-12-04 DIAGNOSIS — M19041 Primary osteoarthritis, right hand: Secondary | ICD-10-CM

## 2018-12-04 DIAGNOSIS — Z8709 Personal history of other diseases of the respiratory system: Secondary | ICD-10-CM

## 2018-12-04 DIAGNOSIS — M5136 Other intervertebral disc degeneration, lumbar region: Secondary | ICD-10-CM

## 2018-12-04 DIAGNOSIS — M7122 Synovial cyst of popliteal space [Baker], left knee: Secondary | ICD-10-CM

## 2018-12-04 DIAGNOSIS — Z8639 Personal history of other endocrine, nutritional and metabolic disease: Secondary | ICD-10-CM

## 2018-12-04 DIAGNOSIS — Z79899 Other long term (current) drug therapy: Secondary | ICD-10-CM

## 2018-12-04 DIAGNOSIS — R899 Unspecified abnormal finding in specimens from other organs, systems and tissues: Secondary | ICD-10-CM | POA: Diagnosis not present

## 2019-01-22 ENCOUNTER — Encounter: Payer: Self-pay | Admitting: Orthopaedic Surgery

## 2019-01-22 ENCOUNTER — Ambulatory Visit (INDEPENDENT_AMBULATORY_CARE_PROVIDER_SITE_OTHER): Payer: Medicare Other | Admitting: Orthopaedic Surgery

## 2019-01-22 ENCOUNTER — Ambulatory Visit (INDEPENDENT_AMBULATORY_CARE_PROVIDER_SITE_OTHER): Payer: Medicare Other

## 2019-01-22 DIAGNOSIS — M5431 Sciatica, right side: Secondary | ICD-10-CM

## 2019-01-22 NOTE — Progress Notes (Signed)
Office Visit Note   Patient: [name]           Date of Birth: 01/29/1941           MRN: [id number] Visit Date: 01/22/2019              Requested by: Dineen Kid, MD [address] [place],  [place] [address] PCP: Dineen Kid, MD   Assessment & Plan: Visit Diagnoses:  1. Sciatica, right side     Plan: Her exam was basically normal today other than the pain that she is having.  She does have Neurontin at home that she had last year for shingles.  I want her to start taking this at night for 5 days and then can increase that to twice daily.  I would also like to send her to physical therapy to see if they can help ascertain why she is having the symptoms she is having and see if she can be treated for sciatica or any modalities that can help decrease her pain and improve her function.  I would then like to see her back in 3 weeks to see if we would need to obtain an MRI of her lumbar spine or not.  All question concerns were answered and addressed.  Follow-Up Instructions: Return in about 3 weeks (around 02/12/2019).   Orders:  Orders Placed This Encounter  Procedures  . XR Lumbar Spine 2-3 Views   No orders of the defined types were placed in this encounter.     Procedures: No procedures performed   Clinical Data: No additional findings.   Subjective: Chief Complaint  Patient presents with  . Right Hip - Pain  The patient is a very pleasant 78 year old female that I have seen in the past.  She is very active and comes in with a chief complaint of pain that is radiating from her back down her right leg to her foot.  She denies any numbness and tingling or weakness.  This is been going on for about 2 or 3 months and is been getting worse.  It does wake her up at night.  She denies any groin pain.  She points to her trochanteric area but more of a sciatic area as a source of her pain that does radiate again down to the foot.  She is not a smoker not a diabetic.  It has  slowly been getting worse.  She denies any change in bowel bladder function.  She has taken a muscle relaxant before when she did feel like she strained her back.  She denies any instability symptoms of the right hip or right knee.  HPI  Review of Systems She currently denies any headache, chest pain, shortness of breath, fever, chills, nausea, vomiting  Objective: Vital Signs: There were no vitals taken for this visit.  Physical Exam She is alert and orient x3 and in no acute distress Ortho Exam Examination of her right hip and knee is entirely normal.  She has full active and passive range of motion of the right hip with no pain.  There is no knee joint effusion in her right knee is ligamentously stable.  She has no pain to palpation over the trochanteric area or the IT band.  She has 5 out of 5 strength in her right lower extremity and normal sensation as well.  There is some pain to palpation of the lower lumbar spine to the right side into the sciatic area. Specialty Comments:  No specialty comments available.  Imaging: Xr Lumbar Spine 2-3 Views  Result Date: 01/22/2019 2 views of the lumbar spine show no acute findings.  The alignment is very well-maintained in the AP and lateral planes.  The disc heights are maintained for age.    PMFS History: Patient Active Problem List   Diagnosis Date Noted  . ILD (interstitial lung disease) ([place]) 11/28/2016  . Multiple lung nodules on CT 11/28/2016  . Abnormal laboratory test 11/18/2016  . DDD L spine 11/18/2016  . 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 History of hypothyroidism 11/10/2016  . History of hemochromatosis 11/10/2016  . Pulmonary emphysema ([place]) 08/31/2016  . [name] phenomenon without gangrene 08/31/2016  . Sinusitis, chronic 08/31/2016  . Chronic cough 02/10/2016  . Irritable larynx 02/10/2016  . Stopped smoking with greater than 40 pack year history 02/10/2016  . Chest pain, unspecified 10/06/2010  . Shortness of  breath 10/06/2010  . Hypothyroidism   . Hereditary hemochromatosis ([place]) 04/22/2010  . ATRIAL FIBRILLATION 10/20/2008  . [facility] 10/20/2008  . SYNCOPE AND COLLAPSE 10/20/2008   Past Medical History:  Diagnosis Date  . Atrial fibrillation ([place])    a. Flecainide therapy;  b. event monitor 4/12  . Chest pain    a. GXT myoview 4/12: no isch., EF 86%;   b. echo 4/12: EF 55-65%, grade 1 diast dysfxn, LAE  . Depression with anxiety   . GERD (gastroesophageal reflux disease)   . 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 Beryle Beams  . History of syncope   . Hypothyroidism     Family History  Problem Relation Age of Onset  . Stroke Mother   . Heart disease Father 18  . Hemochromatosis Brother     Past Surgical History:  Procedure Laterality Date  . MOUTH SURGERY    . SHOULDER ARTHROSCOPY Left   . TOTAL ABDOMINAL HYSTERECTOMY    . VARICOSE VEIN SURGERY     Social History   Occupational History  . Occupation: RETIRED    Employer: RETIRED  Tobacco Use  . Smoking status: Former Smoker    Packs/day: 1.00    Years: 42.00    Pack years: 42.00    Quit date: 06/27/1997    Years since quitting: 21.5  . Smokeless tobacco: Never Used  Substance and Sexual Activity  . Alcohol use: Yes    Comment: occ  . Drug use: No  . Sexual activity: Not on file

## 2019-01-29 ENCOUNTER — Telehealth: Payer: Self-pay | Admitting: Rheumatology

## 2019-01-29 NOTE — Telephone Encounter (Signed)
I do not recall the name of the massage therapist.  Although she may consider going to [employer] which is run by a massage school.

## 2019-01-29 NOTE — Telephone Encounter (Signed)
Patient advised Dr. Estanislado Pandy  does  not recall the name of the massage therapist.  Although she may consider going to [employer] which is run by a massage school. Patient states that it was not a massage school. Patient states the place she was talking about was off [facility]. Patient advised it was [address]. Patient was provided with phone number.

## 2019-01-29 NOTE — Telephone Encounter (Signed)
Patient left a voicemail stating she lost the name of the massage therapist that Dr. Estanislado Pandy recommended.  Patient is requesting a return call.

## 2019-02-06 ENCOUNTER — Other Ambulatory Visit: Payer: Self-pay | Admitting: Internal Medicine

## 2019-02-12 ENCOUNTER — Ambulatory Visit: Payer: Medicare Other | Admitting: Orthopaedic Surgery

## 2019-02-14 ENCOUNTER — Other Ambulatory Visit (HOSPITAL_COMMUNITY)
Admission: RE | Admit: 2019-02-14 | Discharge: 2019-02-14 | Disposition: A | Discharge status: Home / Self Care | Payer: Medicare Other | Source: Ambulatory Visit | Attending: Internal Medicine | Admitting: Internal Medicine

## 2019-02-14 DIAGNOSIS — Z20828 Contact with and (suspected) exposure to other viral communicable diseases: Secondary | ICD-10-CM | POA: Diagnosis not present

## 2019-02-14 DIAGNOSIS — Z01812 Encounter for preprocedural laboratory examination: Secondary | ICD-10-CM | POA: Insufficient documentation

## 2019-02-14 LAB — SARS CORONAVIRUS 2 (TAT 6-24 HRS): SARS Coronavirus 2: NEGATIVE

## 2019-02-15 ENCOUNTER — Other Ambulatory Visit: Payer: Self-pay | Admitting: *Deleted

## 2019-02-15 DIAGNOSIS — J849 Interstitial pulmonary disease, unspecified: Secondary | ICD-10-CM

## 2019-02-15 NOTE — Progress Notes (Signed)
error 

## 2019-02-18 ENCOUNTER — Ambulatory Visit (INDEPENDENT_AMBULATORY_CARE_PROVIDER_SITE_OTHER): Payer: Medicare Other | Admitting: Pulmonary Disease

## 2019-02-18 ENCOUNTER — Encounter: Payer: Self-pay | Admitting: Pulmonary Disease

## 2019-02-18 ENCOUNTER — Other Ambulatory Visit: Payer: Self-pay

## 2019-02-18 ENCOUNTER — Telehealth: Payer: Self-pay | Admitting: Pharmacist

## 2019-02-18 ENCOUNTER — Ambulatory Visit (INDEPENDENT_AMBULATORY_CARE_PROVIDER_SITE_OTHER): Payer: Medicare Other | Admitting: Internal Medicine

## 2019-02-18 VITALS — BP 116/70 | HR 73 | Temp 98.7°F | Ht 63.75 in | Wt 145.0 lb

## 2019-02-18 DIAGNOSIS — J432 Centrilobular emphysema: Secondary | ICD-10-CM | POA: Diagnosis not present

## 2019-02-18 DIAGNOSIS — J849 Interstitial pulmonary disease, unspecified: Secondary | ICD-10-CM

## 2019-02-18 DIAGNOSIS — Z79899 Other long term (current) drug therapy: Secondary | ICD-10-CM

## 2019-02-18 DIAGNOSIS — R768 Other specified abnormal immunological findings in serum: Secondary | ICD-10-CM

## 2019-02-18 DIAGNOSIS — Z23 Encounter for immunization: Secondary | ICD-10-CM | POA: Diagnosis not present

## 2019-02-18 DIAGNOSIS — I7300 Raynaud's syndrome without gangrene: Secondary | ICD-10-CM

## 2019-02-18 DIAGNOSIS — Z0000 Encounter for general adult medical examination without abnormal findings: Secondary | ICD-10-CM

## 2019-02-18 DIAGNOSIS — Z5181 Encounter for therapeutic drug level monitoring: Secondary | ICD-10-CM

## 2019-02-18 LAB — PULMONARY FUNCTION TEST
DL/VA % pred: 79 %
DL/VA: 3.27 ml/min/mmHg/L
DLCO unc % pred: 81 %
DLCO unc: 15.31 ml/min/mmHg
FEF 25-75 Pre: 0.97 L/sec
FEF2575-%Pred-Pre: 62 %
FEV1-%Pred-Pre: 93 %
FEV1-Pre: 1.89 L
FEV1FVC-%Pred-Pre: 90 %
FEV6-%Pred-Pre: 108 %
FEV6-Pre: 2.77 L
FEV6FVC-%Pred-Pre: 103 %
FVC-%Pred-Pre: 104 %
FVC-Pre: 2.82 L
Pre FEV1/FVC ratio: 67 %
Pre FEV6/FVC Ratio: 98 %

## 2019-02-18 MED ORDER — SPIRIVA RESPIMAT 2.5 MCG/ACT IN AERS: 2.0000 | INHALATION_SPRAY | Freq: Every day | RESPIRATORY_TRACT | 0 refills | Status: DC

## 2019-02-18 NOTE — Assessment & Plan Note (Addendum)
Plan: Continue Spiriva Respimat 2.5 Referral clinic pharmacy team for medication management, med review, assistance with cost of Spiriva High-dose flu vaccine today Spirometry with DLCO in 6 months Follow-up with Dr. Chase Caller in 3 months

## 2019-02-18 NOTE — Assessment & Plan Note (Addendum)
Plan: Referral to clinic pharmacy team today Lab work today

## 2019-02-18 NOTE — Patient Instructions (Signed)
You were seen today by Lauraine Rinne, NP  for:   1. ILD (interstitial lung disease) (HCC)  Continue prednisone 5 mg daily Continue Monday Wednesday Friday Bactrim Continue Myfortic Lab work today  - CT Chest High Resolution; Future - Pulmonary function test; Future -6 months  Follow-up with Dr. Chase Caller in 3 months Flu vaccine today   2. Centrilobular emphysema (HCC)  Spiriva Respimat 2.5 >>> 2 puffs daily >>> Do this every day >>>This is not a rescue inhaler  Samples provided today Referral to clinic pharmacy team for help with Spiriva cost  - Pulmonary function test; Future  3. Interstitial pulmonary disease ([place])  CT chest ordered Continue follow-up with rheumatology  4. Therapeutic drug monitoring  - Comp Met (CMET); Future - CBC with Differential/Platelet; Future  5. High risk medication use  Referral to clinic pharmacy team  6. Scl-70 antibody positive  Continue follow-up with rheumatology  7. Healthcare maintenance  Flu vaccine in office today   We recommend today:  Orders Placed This Encounter  Procedures  . CT Chest High Resolution    -High-res CT with supine and prone positioning -inspiratory and expiratory cuts.  -Only to be read by Dr. Rosario Jacks and Dr. Weber Cooks.    Standing Status:   Future    Standing Expiration Date:   04/19/2020    Scheduling Instructions:     Over next 4-6 weeks    Order Specific Question:   Preferred imaging location?    Answer:   [place]    Order Specific Question:   Radiology Contrast Protocol - do NOT remove file path    Answer:   _0 [email or web address]  . Comp Met (CMET)    Standing Status:   Future    Standing Expiration Date:   02/18/2020  . CBC with Differential/Platelet    Standing Status:   Future    Standing Expiration Date:   02/18/2020  . Pulmonary function test    Arlyce Harman with dlco    Standing Status:   Future    Standing Expiration Date:   02/18/2020    Order  Specific Question:   Where should this test be performed?    Answer:   [facility]   Orders Placed This Encounter  Procedures  . CT Chest High Resolution  . Comp Met (CMET)  . CBC with Differential/Platelet  . Pulmonary function test   No orders of the defined types were placed in this encounter.   Follow Up:    Return in about 3 months (around 05/21/2019), or if symptoms worsen or fail to improve, for Follow up with Dr. Purnell Shoemaker - [facility] , With walk in office.   Please do your part to reduce the spread of COVID-19:      Reduce your risk of any infection  and COVID19 by using the similar precautions used for avoiding the common cold or flu:  .Marland KitchenWash your hands often with soap and warm water for at least 20 seconds.  If soap and water are not readily available, use an alcohol-based hand sanitizer with at least 60% alcohol.  . If coughing or sneezing, cover your mouth and nose by coughing or sneezing into the elbow areas of your shirt or coat, into a tissue or into your sleeve (not your hands). .Langley GaussA MASK when in public  . Avoid shaking hands with others and consider head nods or verbal greetings only. . Avoid touching your eyes, nose, or mouth with unwashed  hands.  . Avoid close contact with people who are sick. . Avoid places or events with large numbers of people in one location, like concerts or sporting events. . If you have some symptoms but not all symptoms, continue to monitor at home and seek medical attention if your symptoms worsen. . If you are having a medical emergency, call 911.   [facility] / e-Visit: [email or web address]         [facility]: [facility]: [phone]                   [facility]: [phone]     It is flu season:   >>> Best ways to protect herself from the flu:  Receive the yearly flu vaccine, practice good hand hygiene washing with soap and also using hand sanitizer when available, eat a nutritious meals, get adequate rest, hydrate appropriately   Please contact the office if your symptoms worsen or you have concerns that you are not improving.   Thank you for choosing [facility] for your healthcare, and for allowing [name] to partner with you on your healthcare journey. I am thankful to be able to provide care to you today.   Wyn Quaker FNP-C    Influenza Virus Vaccine injection What is this medicine? INFLUENZA VIRUS VACCINE (in floo EN zuh VAHY ruhs vak SEEN) helps to reduce the risk of getting influenza also known as the flu. The vaccine only helps protect you against some strains of the flu. This medicine may be used for other purposes; ask your health care provider or pharmacist if you have questions. COMMON BRAND NAME(S): Afluria, Afluria Quadrivalent, Agriflu, Alfuria, FLUAD, Fluarix, Fluarix Quadrivalent, Flublok, Flublok Quadrivalent, FLUCELVAX, Flulaval, Fluvirin, Fluzone, Fluzone High-Dose, Fluzone Intradermal What should I tell my health care provider before I take this medicine? They need to know if you have any of these conditions:  bleeding disorder like hemophilia  fever or infection  Guillain-Barre syndrome or other neurological problems  immune system problems  infection with the human immunodeficiency virus (HIV) or AIDS  low blood platelet counts  multiple sclerosis  an unusual or allergic reaction to influenza virus vaccine, latex, other medicines, foods, dyes, or preservatives. Different brands of vaccines contain different allergens. Some may contain latex or eggs. Talk to your doctor about your allergies to make sure that you get the right vaccine.  pregnant or trying to get pregnant  breast-feeding How should I use this medicine? This vaccine is for injection into a muscle or under the skin. It is  given by a health care professional. A copy of Vaccine Information Statements will be given before each vaccination. Read this sheet carefully each time. The sheet may change frequently. Talk to your healthcare provider to see which vaccines are right for you. Some vaccines should not be used in all age groups. Overdosage: If you think you have taken too much of this medicine contact a poison control center or emergency room at once. NOTE: This medicine is only for you. Do not share this medicine with others. What if I miss a dose? This does not apply. What may interact with this medicine?  chemotherapy or radiation therapy  medicines that lower your immune system like etanercept, anakinra, infliximab, and adalimumab  medicines that treat or prevent blood clots like warfarin  phenytoin  steroid medicines like prednisone or cortisone  theophylline  vaccines This list  may not describe all possible interactions. Give your health care provider a list of all the medicines, herbs, non-prescription drugs, or dietary supplements you use. Also tell them if you smoke, drink alcohol, or use illegal drugs. Some items may interact with your medicine. What should I watch for while using this medicine? Report any side effects that do not go away within 3 days to your doctor or health care professional. Call your health care provider if any unusual symptoms occur within 6 weeks of receiving this vaccine. You may still catch the flu, but the illness is not usually as bad. You cannot get the flu from the vaccine. The vaccine will not protect against colds or other illnesses that may cause fever. The vaccine is needed every year. What side effects may I notice from receiving this medicine? Side effects that you should report to your doctor or health care professional as soon as possible:  allergic reactions like skin rash, itching or hives, swelling of the face, lips, or tongue Side effects that usually  do not require medical attention (report to your doctor or health care professional if they continue or are bothersome):  fever  headache  muscle aches and pains  pain, tenderness, redness, or swelling at the injection site  tiredness This list may not describe all possible side effects. Call your doctor for medical advice about side effects. You may report side effects to FDA at 1-800-FDA-1088. Where should I keep my medicine? The vaccine will be given by a health care professional in a clinic, pharmacy, doctor's office, or other health care setting. You will not be given vaccine doses to store at home. NOTE: This sheet is a summary. It may not cover all possible information. If you have questions about this medicine, talk to your doctor, pharmacist, or health care provider.  2020 Elsevier/Gold Standard (2018-05-08 08:45:43)

## 2019-02-18 NOTE — Telephone Encounter (Signed)
Received referral from Wyn Quaker, [place] for the following:  Medication Review  Will schedule an appointment with the pharmacy team.

## 2019-02-18 NOTE — Assessment & Plan Note (Signed)
Plan: Continue follow-up with rheumatology 

## 2019-02-18 NOTE — Progress Notes (Signed)
Spiro and DLCO performed today. 

## 2019-02-18 NOTE — Assessment & Plan Note (Signed)
Plan: °Flu vaccine today °

## 2019-02-18 NOTE — Assessment & Plan Note (Signed)
Plan: Lab work today 

## 2019-02-18 NOTE — Assessment & Plan Note (Signed)
Plan: High resolution CT chest ordered Spirometry with DLCO ordered to be completed in 6 months Follow-up with Dr. Chase Caller in 3 months Walk today in office Continue follow-up with rheumatology Continue 5 mg prednisone daily Continue Myfortic Continue Monday Wednesday Friday Bactrim Referral to clinic pharmacy team for medication review

## 2019-02-18 NOTE — Progress Notes (Signed)
@Patient  ID: [name], female    DOB: 1940-11-19, 78 y.o.   MRN: [id number]  Chief Complaint  Patient presents with  . Follow-up    ILD, IPAF follow up     Referring provider: ViaLennette Bihari, MD  HPI:  78 year old female former smoker followed in our office for ILD  PMH: A. fib, hypothyroidism, raynauds phenomenon without gangrene, SCL 70 antibody positive Smoker/ Smoking History: Former smoker.  Quit 1999.  42-pack-year smoking history. Maintenance: Prednisone 5 mg daily, Myfortic, Bactrim Monday Wednesday Friday, Spiriva Respimat 2.5 Pt of: Dr. Chase Caller   02/18/2019  - Visit   78 year old female former smoker followed in our office for ILD.  Patient with previous positive SCL 70 antibody.  Patient is followed by rheumatology.  Patient is maintained on 5 mg of prednisone daily, Myfortic, Monday Wednesday Friday Bactrim.  Patient also continues to take Spiriva Respimat 2.5.  Her shortness of breath has improved slightly since last office visit in February/2020.  Patient reports she still has occasional bouts of shortness of breath the last about 1 or 2 seconds with physical exertion.  Patient presents today to complete a spirometry with DLCO.  Results are listed below:  02/18/2019-pulmonary function test-spirometry with DLCO-FVC 2.82 (104% predicted), ratio 67, FEV1 1.89 (93% predicted), DLCO 15.31 (81% predicted)  Overall, patient reports she is doing well.  She has no active concerns or complaints.  She is interested in receiving her flu vaccine today if we have it.   Tests:   02/14/2019-SARS-CoV-2-negative  Connective tissue work-up: 10/20/2016-anti-DNA antibody double-stranded-negative, anticardiolipin IgA-12, anticardiolipin IgG less than 14- Anticardiolipin IgM-47-low to medium positive Lupus anticoagulant eval-not detected Beta-2 glycoprotein IgG-less than 9, negative Beta-2 glycoprotein IgM-31 Beta-2 glycoprotein 18 IgA-less than 9, negative Pan ANCA-negative  Cryo-globin-negative RF X DRV VT SCR with reflex- negative, D or VVT screen- 102   06/22/2017- connective tissue work-up- Anti-DNA antibody double-stranded- 2, negative Beta-2 glycoprotein antibodies-negative Cardiolipin antibodies IgG, IgM, IgA:  Anticardiolipin IgA-29-low to medium positive Anticardiolipin IgG-14, negative Anticardiolipin IgM-35-low to medium positive C3 and C4 component-negative Vitamin D- 31, optimal   07/18/2017-G6PD H-18.6, normal   07/18/2018-CT chest high-res-vaguely bandlike patchy consolidation and groundglass opacity at the periphery of the right upper and superior segment right lower lobes, new since 07/03/2017 chest CT, improving compared to 07/10/2018 chest x-ray, favoring resolving pneumonia with evolving postinfectious scarring, previously described basilar predominant subpleural reticulation and groundglass attenuation is minimal and appears decreased in the interval, no bronchiectasis or honeycombing, findings may represent an interstitial lung disease such as NSIP, less likely to favor early UIP, findings are indeterminate for UIP per consensus guidelines, moderate centrilobular emphysema and mild diffuse bronchial wall thickening suggesting COPD  09/12/2018-chest x-ray-complete clearing of the patchy infiltrates in the right lung, no acute abnormalities  08/14/2018-pulmonary function test- FVC-2.92 (108% predicted), ratio 67, FEV1 1.95 (96% predicted), DLCO 13.19 (70% predicted)   02/18/2019-pulmonary function test-spirometry with DLCO-FVC 2.82 (104% predicted), ratio 67, FEV1 1.89 (93% predicted), DLCO 15.31 (81% predicted)  10/14/2010-echocardiogram-LV ejection fraction 55 to [id number], grade 1 diastolic dysfunction  SIX MIN WALK 02/18/2019 08/14/2018 02/01/2018 10/31/2017 07/18/2017  Supplimental Oxygen during Test? (L/min) No No No - No  Tech Comments: Patient was able to walk at a moderate pace. Denied any symptoms of chest pain, leg pain or SOB. No O2 was needed  during or after walk. Pt walked at a normal pace completing all required laps denying any complaints. Pt walked at a normal pace completing all required laps. Pt had  mild SOB. Walked at a normal pace with no c/o SOB or chest discomfort. Tolerated well. ambulated at fast,steady pace, no SOB     FENO:  Lab Results  Component Value Date   NITRICOXIDE 12 02/10/2016    PFT: PFT Results Latest Ref Rng & Units 02/18/2019 08/14/2018 02/01/2018 09/19/2017 06/12/2017 11/28/2016 02/12/2016  FVC-Pre L 2.82 2.92 2.87 3.04 2.68 2.81 2.85  FVC-Predicted Pre % 104 108 105 111 97 101 101  FVC-Post L - - - - - - 3.05  FVC-Predicted Post % - - - - - - 108  Pre FEV1/FVC % % 67 67 68 66 65 65 68  Post FEV1/FCV % % - - - - - - 71  FEV1-Pre L 1.89 1.95 1.95 2.02 1.74 1.82 1.92  FEV1-Predicted Pre % 93 96 96 98 84 87 91  FEV1-Post L - - - - - - 2.15  DLCO UNC% % 81 70 54 57 50 54 58  DLCO COR %Predicted % 79 68 60 61 61 57 63  TLC L - - - - - - 5.77  TLC % Predicted % - - - - - - 115  RV % Predicted % - - - - - - 106    Imaging: Xr Lumbar Spine 2-3 Views  Result Date: 01/22/2019 2 views of the lumbar spine show no acute findings.  The alignment is very well-maintained in the AP and lateral planes.  The disc heights are maintained for age.     Specialty Problems      Pulmonary Problems   Shortness of breath   Chronic cough   Irritable larynx   Pulmonary emphysema (HCC)    08/14/2018-pulmonary function test- FVC-2.92 (108% predicted), ratio 67, FEV1 1.95 (96% predicted), DLCO 13.19 (70% predicted)   02/18/2019-pulmonary function test-spirometry with DLCO-FVC 2.82 (104% predicted), ratio 67, FEV1 1.89 (93% predicted), DLCO 15.31 (81% predicted)      Sinusitis, chronic   ILD (interstitial lung disease) (HCC)    Connective tissue work-up: 10/20/2016-anti-DNA antibody double-stranded-negative, anticardiolipin IgA-12, anticardiolipin IgG less than 14- Anticardiolipin IgM-47-low to medium positive Lupus  anticoagulant eval-not detected Beta-2 glycoprotein IgG-less than 9, negative Beta-2 glycoprotein IgM-31 Beta-2 glycoprotein 18 IgA-less than 9, negative Pan ANCA-negative Cryo-globin-negative RF X DRV VT SCR with reflex- negative, D or VVT screen- 102   06/22/2017- connective tissue work-up- Anti-DNA antibody double-stranded- 2, negative Beta-2 glycoprotein antibodies-negative Cardiolipin antibodies IgG, IgM, IgA:  Anticardiolipin IgA-29-low to medium positive Anticardiolipin IgG-14, negative Anticardiolipin IgM-35-low to medium positive C3 and C4 component-negative Vitamin D- 31, optimal   07/18/2017-G6PD H-18.6, normal   07/18/2018-CT chest high-res-vaguely bandlike patchy consolidation and groundglass opacity at the periphery of the right upper and superior segment right lower lobes, new since 07/03/2017 chest CT, improving compared to 07/10/2018 chest x-ray, favoring resolving pneumonia with evolving postinfectious scarring, previously described basilar predominant subpleural reticulation and groundglass attenuation is minimal and appears decreased in the interval, no bronchiectasis or honeycombing, findings may represent an interstitial lung disease such as NSIP, less likely to favor early UIP, findings are indeterminate for UIP per consensus guidelines, moderate centrilobular emphysema and mild diffuse bronchial wall thickening suggesting COPD  08/14/2018-pulmonary function test- FVC-2.92 (108% predicted), ratio 67, FEV1 1.95 (96% predicted), DLCO 13.19 (70% predicted)   02/18/2019-pulmonary function test-spirometry with DLCO-FVC 2.82 (104% predicted), ratio 67, FEV1 1.89 (93% predicted), DLCO 15.31 (81% predicted)       Multiple lung nodules on CT      No Known Allergies  Immunization History  Administered Date(s) Administered  . Fluad Quad(high Dose 65+) 02/18/2019  . Influenza, High Dose Seasonal PF 08/29/2017, 04/26/2018  . Pneumococcal Conjugate-13 06/12/2014  .  Pneumococcal Polysaccharide-23 06/27/2010  . Tdap 03/16/2011  . Zoster 06/27/2010   Flu vaccine today   Past Medical History:  Diagnosis Date  . Atrial fibrillation ([place])    a. Flecainide therapy;  b. event monitor 4/12  . Chest pain    a. GXT myoview 4/12: no isch., EF 86%;   b. echo 4/12: EF 55-65%, grade 1 diast dysfxn, LAE  . Depression with anxiety   . GERD (gastroesophageal reflux disease)   . 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 Beryle Beams  . History of syncope   . Hypothyroidism     Tobacco History: Social History   Tobacco Use  Smoking Status Former Smoker  . Packs/day: 1.00  . Years: 42.00  . Pack years: 42.00  . Quit date: 06/27/1997  . Years since quitting: 21.6  Smokeless Tobacco Never Used   Counseling given: Yes   Continue to not smoke  Outpatient Encounter Medications as of 02/18/2019  Medication Sig  . acetaminophen (TYLENOL) 325 MG tablet Take 650 mg by mouth every 6 (six) hours as needed for mild pain or headache.  . Biotin 1000 MCG tablet Take 1,000 mcg by mouth daily.  . Calcium Carbonate 1500 MG TABS Take 1 tablet by mouth 3 (three) times a week.  . Cholecalciferol (VITAMIN D PO) Take 5,000 Units by mouth daily.   . flecainide (TAMBOCOR) 100 MG tablet Take 1 tablet (100 mg total) by mouth 2 (two) times daily.  . folic acid (FOLVITE) 1 MG tablet Take 1 mg by mouth daily.    Marland Kitchen levothyroxine (SYNTHROID, LEVOTHROID) 112 MCG tablet Take 112 mcg by mouth daily.    . methocarbamol (ROBAXIN) 750 MG tablet Take 1 tablet (750 mg total) by mouth at bedtime as needed for muscle spasms. (Patient taking differently: Take 750 mg by mouth daily as needed for muscle spasms. )  . metoprolol succinate (TOPROL-XL) 25 MG 24 hr tablet TAKE 1 TABLET BY MOUTH  DAILY (Patient taking differently: Take 25 mg by mouth daily. )  . mycophenolate (MYFORTIC) 360 MG TBEC EC tablet TAKE 1 TABLET BY MOUTH TWICE DAILY (Patient taking differently: Take 360 mg  by mouth 2 (two) times daily. )  . omeprazole (PRILOSEC) 40 MG capsule Take 40 mg by mouth daily.  . ondansetron (ZOFRAN ODT) 4 MG disintegrating tablet Take 1-2 tablets three times daily as needed (Patient taking differently: Take 4 mg by mouth every 8 (eight) hours as needed for nausea or vomiting. )  . predniSONE (DELTASONE) 5 MG tablet TAKE 1 TABLET(5 MG) BY MOUTH DAILY WITH BREAKFAST  . SPIRIVA RESPIMAT 2.5 MCG/ACT AERS INHALE 2 PUFFS INTO THE LUNGS DAILY (Patient taking differently: Inhale 2 puffs into the lungs daily. )  . sulfamethoxazole-trimethoprim (BACTRIM DS) 800-160 MG tablet Take 1tablet Monday, 1tablet Wednesday, 1tablet Friday  . VITAMIN B1-B12 IM Inject 1,000 mcg into the muscle every 30 (thirty) days.   Alveda Reasons 20 MG TABS tablet TAKE 1 TABLET BY MOUTH ONCE DAILY WITH FOOD (Patient taking differently: Take 20 mg by mouth daily with supper. )  . Tiotropium Bromide Monohydrate (SPIRIVA RESPIMAT) 2.5 MCG/ACT AERS Inhale 2 puffs into the lungs daily.  . [DISCONTINUED] fluticasone (FLONASE) 50 MCG/ACT nasal spray Place 2 sprays into both nostrils daily. (Patient taking differently: Place 2 sprays into both nostrils daily as  needed for allergies. )   No facility-administered encounter medications on file as of 02/18/2019.      Review of Systems  Review of Systems  Constitutional: Negative for activity change, fatigue and fever.  HENT: Negative for sinus pressure, sinus pain and sore throat.   Respiratory: Positive for shortness of breath (occasionally with physical exertion, feels this has improved and is stable). Negative for cough and wheezing.   Cardiovascular: Negative for chest pain, palpitations and leg swelling.  Gastrointestinal: Negative for diarrhea, nausea and vomiting.  Musculoskeletal: Negative for arthralgias.  Neurological: Negative for dizziness.  Psychiatric/Behavioral: Negative for sleep disturbance. The patient is not nervous/anxious.      Physical Exam   BP 116/70   Pulse 73   Temp 98.7 F (37.1 C) (Oral)   Ht 5' 3.75" (1.619 m)   Wt 145 lb (65.8 kg)   SpO2 97%   BMI 25.08 kg/m   Wt Readings from Last 5 Encounters:  02/18/19 145 lb (65.8 kg)  12/04/18 146 lb (66.2 kg)  09/26/18 147 lb (66.7 kg)  09/22/18 147 lb (66.7 kg)  08/21/18 147 lb (66.7 kg)    Physical Exam Vitals signs and nursing note reviewed.  Constitutional:      General: She is not in acute distress.    Appearance: Normal appearance. She is normal weight.  HENT:     Head: Normocephalic and atraumatic.     Right Ear: Tympanic membrane, ear canal and external ear normal. There is no impacted cerumen.     Left Ear: Tympanic membrane, ear canal and external ear normal. There is no impacted cerumen.     Nose: Nose normal. No congestion.     Mouth/Throat:     Mouth: Mucous membranes are moist.     Pharynx: Oropharynx is clear.  Eyes:     Pupils: Pupils are equal, round, and reactive to light.  Neck:     Musculoskeletal: Normal range of motion.  Cardiovascular:     Rate and Rhythm: Normal rate and regular rhythm.     Pulses: Normal pulses.     Heart sounds: Normal heart sounds. No murmur.  Pulmonary:     Effort: Pulmonary effort is normal. No respiratory distress.     Breath sounds: No decreased air movement. Rales (mild R> L basilar rales) present. No decreased breath sounds or wheezing.  Skin:    General: Skin is warm and dry.     Capillary Refill: Capillary refill takes less than 2 seconds.  Neurological:     General: No focal deficit present.     Mental Status: She is alert and oriented to person, place, and time. Mental status is at baseline.     Gait: Gait normal.  Psychiatric:        Mood and Affect: Mood normal.        Behavior: Behavior normal.        Thought Content: Thought content normal.        Judgment: Judgment normal.      Lab Results:  CBC    Component Value Date/Time   WBC 5.8 09/22/2018 0800   RBC 3.79 (L) 09/22/2018 0800    HGB 12.0 09/22/2018 0800   HGB 12.4 10/10/2016 0839   HGB 12.3 09/17/2012 1136   HCT 38.6 09/22/2018 0800   HCT 37.6 10/10/2016 0839   HCT 36.3 09/17/2012 1136   PLT 230 09/22/2018 0800   PLT 244 10/10/2016 0839   MCV 101.8 (H) 09/22/2018 0800   MCV  98 (H) 10/10/2016 0839   MCV 97.2 09/17/2012 1136   MCH 31.7 09/22/2018 0800   MCHC 31.1 09/22/2018 0800   RDW 13.4 09/22/2018 0800   RDW 14.9 10/10/2016 0839   RDW 15.0 (H) 09/17/2012 1136   LYMPHSABS 1.4 08/14/2018 1606   LYMPHSABS 1.3 10/10/2016 0839   LYMPHSABS 1.5 09/17/2012 1136   MONOABS 0.5 08/14/2018 1606   MONOABS 0.4 09/17/2012 1136   EOSABS 0.0 08/14/2018 1606   EOSABS 0.4 10/10/2016 0839   BASOSABS 0.1 08/14/2018 1606   BASOSABS 0.1 10/10/2016 0839   BASOSABS 0.1 09/17/2012 1136    BMET    Component Value Date/Time   NA 137 09/22/2018 0800   NA 141 10/10/2016 0839   NA 140 09/17/2012 1136   K 3.8 09/22/2018 0800   K 3.8 09/17/2012 1136   CL 108 09/22/2018 0800   CL 105 09/17/2012 1136   CO2 22 09/22/2018 0800   CO2 26 09/17/2012 1136   GLUCOSE 104 (H) 09/22/2018 0800   GLUCOSE 104 (H) 09/17/2012 1136   BUN 10 09/22/2018 0800   BUN 11 10/10/2016 0839   BUN 14.3 09/17/2012 1136   CREATININE 0.75 09/22/2018 0800   CREATININE 0.68 06/22/2017 1104   CREATININE 0.7 09/17/2012 1136   CALCIUM 8.7 (L) 09/22/2018 0800   CALCIUM 9.1 09/17/2012 1136   GFRNONAA >60 09/22/2018 0800   GFRNONAA 86 06/22/2017 1104   GFRAA >60 09/22/2018 0800   GFRAA 99 06/22/2017 1104    BNP    Component Value Date/Time   BNP 289.8 (H) 06/02/2016 0600    ProBNP No results found for: PROBNP    Assessment & Plan:   Pulmonary emphysema (HCC) Plan: Continue Spiriva Respimat 2.5 Referral clinic pharmacy team for medication management, med review, assistance with cost of Spiriva High-dose flu vaccine today Spirometry with DLCO in 6 months Follow-up with Dr. Chase Caller in 3 months  ILD (interstitial lung disease) ([place])  Plan: High resolution CT chest ordered Spirometry with DLCO ordered to be completed in 6 months Follow-up with Dr. Chase Caller in 3 months Walk today in office Continue follow-up with rheumatology Continue 5 mg prednisone daily Continue Myfortic Continue Monday Wednesday Friday Bactrim Referral to clinic pharmacy team for medication review  SCL-70 antibody positive Plan: Continue follow-up with rheumatology  High risk medication use Plan: Referral to clinic pharmacy team today Lab work today  Therapeutic drug monitoring Plan: Lab work today  Healthcare maintenance Plan: Flu vaccine today    Return in about 3 months (around 05/21/2019), or if symptoms worsen or fail to improve, for Follow up with Dr. Purnell Shoemaker - [facility] 14min , With walk in office.   Lauraine Rinne, NP 02/18/2019   This appointment was 34 minutes long with over 50% of the time in direct face-to-face patient care, assessment, plan of care, and follow-up.

## 2019-02-19 LAB — CBC WITH DIFFERENTIAL/PLATELET
Basophils Absolute: 0.1 10*3/uL (ref 0.0–0.1)
Basophils Relative: 1.3 % (ref 0.0–3.0)
Eosinophils Absolute: 0 10*3/uL (ref 0.0–0.7)
Eosinophils Relative: 0.5 % (ref 0.0–5.0)
HCT: 38.5 % (ref 36.0–46.0)
Hemoglobin: 12.5 g/dL (ref 12.0–15.0)
Lymphocytes Relative: 12.8 % (ref 12.0–46.0)
Lymphs Abs: 0.9 10*3/uL (ref 0.7–4.0)
MCHC: 32.4 g/dL (ref 30.0–36.0)
MCV: 104.9 fl — ABNORMAL HIGH (ref 78.0–100.0)
Monocytes Absolute: 0.4 10*3/uL (ref 0.1–1.0)
Monocytes Relative: 6.4 % (ref 3.0–12.0)
Neutro Abs: 5.5 10*3/uL (ref 1.4–7.7)
Neutrophils Relative %: 79 % — ABNORMAL HIGH (ref 43.0–77.0)
Platelets: 235 10*3/uL (ref 150.0–400.0)
RBC: 3.67 Mil/uL — ABNORMAL LOW (ref 3.87–5.11)
RDW: 13.8 % (ref 11.5–15.5)
WBC: 7 10*3/uL (ref 4.0–10.5)

## 2019-02-19 LAB — COMPREHENSIVE METABOLIC PANEL
ALT: 11 U/L (ref 0–35)
AST: 15 U/L (ref 0–37)
Albumin: 4.5 g/dL (ref 3.5–5.2)
Alkaline Phosphatase: 45 U/L (ref 39–117)
BUN: 14 mg/dL (ref 6–23)
CO2: 29 mEq/L (ref 19–32)
Calcium: 9.5 mg/dL (ref 8.4–10.5)
Chloride: 103 mEq/L (ref 96–112)
Creatinine, Ser: 0.65 mg/dL (ref 0.40–1.20)
GFR: 88.24 mL/min (ref >60.00)
Glucose, Bld: 92 mg/dL (ref 70–99)
Potassium: 4.4 mEq/L (ref 3.5–5.1)
Sodium: 139 mEq/L (ref 135–145)
Total Bilirubin: 0.3 mg/dL (ref 0.2–1.2)
Total Protein: 6.9 g/dL (ref 6.0–8.3)

## 2019-02-20 ENCOUNTER — Telehealth: Payer: Self-pay | Admitting: Rheumatology

## 2019-02-20 NOTE — Telephone Encounter (Signed)
Patient called stating Dr. Estanislado Pandy recommended she see a urologist last year.  Patient states she called and scheduled an appointment with Dr. Ileana Roup with [facility] on 03/12/19.  Patient states her insurance Medicare requires she have a referral and is requesting Dr. Estanislado Pandy fax it to [phone].

## 2019-02-20 NOTE — Progress Notes (Signed)
Labwork stable. No changes with medications.   Wyn Quaker FNP

## 2019-02-21 NOTE — Telephone Encounter (Signed)
Called patient to see if she would like to go over medications on the phone. She stated that she had no issues or questions with her regimen, but was interested in patient assistance with Spiriva. She stated she had insurance and so wasn't sure if she'd qualify. Directed her to [email or web address] to look at the application to see if she was interested in moving forward and trying to get approved. Additionally, went over her Spiriva technique and pt correctly stated dosing and frequency. Denies any worsening SOB.   Brenton Grills, Student-PharmD 02/21/2019 4:36 PM

## 2019-02-21 NOTE — Telephone Encounter (Signed)
Patient states she is having trouble muscle spasm and  with right leg and it is interfering with sleep. Patient states it is int buttocks and down the leg. Patient would like to referred to [facility]. Please advise.

## 2019-02-21 NOTE — Telephone Encounter (Signed)
Please advise patient that we will need to schedule an appointment to make a diagnosis before referral to the physical therapy.

## 2019-02-22 NOTE — Telephone Encounter (Signed)
Patient advised we would need to see her in the office for Dr. Estanislado Pandy to make diagnosis for physical therapy. Patient has been scheduled for 02/27/19.

## 2019-02-22 NOTE — Progress Notes (Signed)
Office Visit Note  Patient: [name]             Date of Birth: Sep 04, 1940           MRN: [id number]             PCP: Dineen Kid, MD Referring: Dineen Kid, MD Visit Date: 02/27/2019 Occupation: @GUAROCC @  Subjective:  Trochanteric bursitis bilaterally   History of Present Illness: [name] is a 78 y.o. female with history of [name], ILD, and osteoarthritis.  She is taking Mycophenolate 360 mg TBEC EC tablet and bactrim three times weekly prescribed by Dr. Chase Caller.  She continues to take prednisone 5 mg po daily. She followed up with Wyn Quaker, NP pulmonologist on 02/18/19. According to the patient everything has been stable.    She continues to have intermittent symptoms of raynaud's.  She denies any digital ulcerations or signs or gangrene. She has not noticed any skin tightness or rashes.  She is having trochanteric bursitis bilaterally, and she would like a referral to Ileana Roup for PT.   Activities of Daily Living:  Patient reports morning stiffness for 1 minute.   Patient Reports nocturnal pain.  Difficulty dressing/grooming: Denies Difficulty climbing stairs: Reports Difficulty getting out of chair: Denies Difficulty using hands for taps, buttons, cutlery, and/or writing: Denies  Review of Systems  Constitutional: Negative for fatigue.  HENT: Negative for mouth sores, mouth dryness and nose dryness.   Eyes: Negative for itching and dryness.  Respiratory: Negative for shortness of breath, wheezing and difficulty breathing.   Cardiovascular: Negative for chest pain and palpitations.  Gastrointestinal: Negative for blood in stool, constipation and diarrhea.  Endocrine: Negative for increased urination.  Genitourinary: Negative for difficulty urinating and painful urination.  Musculoskeletal: Positive for arthralgias and joint pain. Negative for joint swelling and morning stiffness.  Skin: Positive for color change. Negative for rash.   Allergic/Immunologic: Negative for susceptible to infections.  Neurological: Positive for numbness. Negative for dizziness, headaches, memory loss and weakness.  Hematological: Positive for bruising/bleeding tendency.  Psychiatric/Behavioral: Positive for sleep disturbance. Negative for confusion. The patient is not nervous/anxious.     PMFS History:  Patient Active Problem List   Diagnosis Date Noted  . High risk medication use 02/18/2019  . Therapeutic drug monitoring 02/18/2019  . Healthcare maintenance 02/18/2019  . ILD (interstitial lung disease) ([place]) 11/28/2016  . Multiple lung nodules on CT 11/28/2016  . Abnormal laboratory test 11/18/2016  . DDD L spine 11/18/2016  . 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 History of hypothyroidism 11/10/2016  . History of hemochromatosis 11/10/2016  . Pulmonary emphysema ([place]) 08/31/2016  . [name] phenomenon without gangrene 08/31/2016  . Sinusitis, chronic 08/31/2016  . Chronic cough 02/10/2016  . Irritable larynx 02/10/2016  . Stopped smoking with greater than 40 pack year history 02/10/2016  . Chest pain, unspecified 10/06/2010  . Shortness of breath 10/06/2010  . Hypothyroidism   . Hereditary hemochromatosis ([place]) 04/22/2010  . ATRIAL FIBRILLATION 10/20/2008  . [facility] 10/20/2008  . SYNCOPE AND COLLAPSE 10/20/2008    Past Medical History:  Diagnosis Date  . Atrial fibrillation ([place])    a. Flecainide therapy;  b. event monitor 4/12  . Chest pain    a. GXT myoview 4/12: no isch., EF 86%;   b. echo 4/12: EF 55-65%, grade 1 diast dysfxn, LAE  . Depression with anxiety   . GERD (gastroesophageal reflux disease)   . Hemochromatosis    indentified by the C282Y gene mutation;  Dr. Beryle Beams  . History of syncope   . Hypothyroidism     Family History  Problem Relation Age of Onset  . Stroke Mother   . Heart disease Father 65  . Hemochromatosis Brother    Past Surgical History:  Procedure Laterality Date  . MOUTH SURGERY     . SHOULDER ARTHROSCOPY Left   . TOTAL ABDOMINAL HYSTERECTOMY    . VARICOSE VEIN SURGERY     Social History   Social History Narrative   REGULAR EXERCISE   Immunization History  Administered Date(s) Administered  . Fluad Quad(high Dose 65+) 02/18/2019  . Influenza, High Dose Seasonal PF 08/29/2017, 04/26/2018  . Pneumococcal Conjugate-13 06/12/2014  . Pneumococcal Polysaccharide-23 06/27/2010  . Tdap 03/16/2011  . Zoster 06/27/2010     Objective: Vital Signs: BP 103/65 (BP Location: Left Arm, Patient Position: Sitting, Cuff Size: Normal)   Pulse 71   Resp 13   Ht 5' 3.5" (1.613 m)   Wt 146 lb 3.2 oz (66.3 kg)   BMI 25.49 kg/m    Physical Exam Vitals signs and nursing note reviewed.  Constitutional:      Appearance: She is well-developed.  HENT:     Head: Normocephalic and atraumatic.  Eyes:     Conjunctiva/sclera: Conjunctivae normal.  Neck:     Musculoskeletal: Normal range of motion.  Cardiovascular:     Rate and Rhythm: Normal rate and regular rhythm.     Heart sounds: Normal heart sounds.  Pulmonary:     Effort: Pulmonary effort is normal.     Breath sounds: Normal breath sounds.     Comments: No crackles  Abdominal:     General: Bowel sounds are normal.     Palpations: Abdomen is soft.  Lymphadenopathy:     Cervical: No cervical adenopathy.  Skin:    General: Skin is warm and dry.     Capillary Refill: Capillary refill takes 2 to 3 seconds.     Comments: No digital ulcerations  No skin tightness or periungual capillary changes   Neurological:     Mental Status: She is alert and oriented to person, place, and time.  Psychiatric:        Behavior: Behavior normal.      Musculoskeletal Exam: C-spine limited ROM.  Thoracic and lumbar spine good ROM.  No midline spinal tenderness. Shoulder joints, elbow joints, wrist joints, MCPs, PIPs, and DIPs good ROM with no synovitis.  Dupuytren's contracture right ring finger.  PIP and DIP synovial thickening  consistent with osteoarthritis of both hands. Hip joints, knee joints, ankle joints, MTPs, PIPs, and DIPs good ROM with no synovitis.  No warmth or effusion of knee joints.  No tenderness or swelling of ankle joints. Tenderness over trochanteric bursa bilaterally.  1st MTP, PIP, and DIP synovial thickening in both feet.   CDAI Exam: CDAI Score: - Patient Global: -; Provider Global: - Swollen: -; Tender: - Joint Exam   No joint exam has been documented for this visit   There is currently no information documented on the homunculus. Go to the Rheumatology activity and complete the homunculus joint exam.  Investigation: No additional findings.  Imaging: No results found.  Recent Labs: Lab Results  Component Value Date   WBC 7.0 02/18/2019   HGB 12.5 02/18/2019   PLT 235.0 02/18/2019   NA 139 02/18/2019   K 4.4 02/18/2019   CL 103 02/18/2019   CO2 29 02/18/2019   GLUCOSE 92 02/18/2019   BUN 14 02/18/2019  CREATININE 0.65 02/18/2019   BILITOT 0.3 02/18/2019   ALKPHOS 45 02/18/2019   AST 15 02/18/2019   ALT 11 02/18/2019   PROT 6.9 02/18/2019   ALBUMIN 4.5 02/18/2019   CALCIUM 9.5 02/18/2019   GFRAA >60 09/22/2018   QFTBGOLDPLUS NEGATIVE 08/10/2017    Speciality Comments: No specialty comments available.  Procedures:  No procedures performed Allergies: Patient has no known allergies.   Assessment / Plan:     Visit Diagnoses: [name] phenomenon without gangrene: She experiences intermittent symptoms of [name].  She has no digital ulcerations or signs of gangrene.  No periungual changes were noted.  She was advised to notify us if she develops any new or worsening symptoms.  Abnormal laboratory test - Positive anticardiolipin IgM 47, positive beta-2 IgM 31: She is taking Xarelto 20 mg 1 tablet by mouth daily.   ILD (interstitial lung disease) ([place]) -  Scl-70+.  She is followed by Dr. Chase Caller.  She has clinically been stable taking Myfortic, Bactrim, and prednisone  5 mg po daily as prescribed by Dr. Chase Caller.  She was evaluated by Wyn Quaker, NP on 02/18/2019 and according to the visit note there has not been any further progression in ILD.   02/18/2019-pulmonary function test-spirometry with DLCO-FVC 2.82 (104% predicted), ratio 67, FEV1 1.89 (93% predicted), DLCO 15.31 (81% predicted) She is scheduled for a repeat high resolution chest CT.  She will be following up with Dr. Jamey Reas in 3 months.   High risk medication use - She is on Myfortic 360 mg twice daily and prednisone 5 mg daily prescribed by Dr. Chase Caller.  CBC and CMP were drawn on 02/18/2019.  Multiple lung nodules on CT  History of emphysema  DDD (degenerative disc disease), lumbar: She has good range of motion with no discomfort.  No midline spinal tenderness.  She has no discomfort at this time  Primary osteoarthritis of both hands: She has PIP and DIP synovial thickening consistent with osteoarthritis of bilateral hands.  She has no tenderness or synovitis.  Joint protection and muscle strengthening were discussed.  Trochanteric bursitis of both hips: She has tenderness over bilateral trochanteric bursa.  She requested a referral to PT for evaluation and treatment.  She would like to see Ileana Roup.   Synovial cyst of left popliteal space: Resolved   Other medical conditions are listed as follows:   History of hemochromatosis  History of hypothyroidism  History of atrial fibrillation  Former smoker    Orders: No orders of the defined types were placed in this encounter.  No orders of the defined types were placed in this encounter.   Face-to-face time spent with patient was 30 minutes. Greater than 50% of time was spent in counseling and coordination of care.  Follow-Up Instructions: Return for [name] syndrome, Osteoarthritis.   Ofilia Neas, PA-C   I examined and evaluated the patient with Hazel Sams PA.  Patient complains of renal symptoms.  She had decrease  capillary refill on examination.  She has no evidence of digital ulcers or sclerodactyly.  Further interstitial lung disease she has been followed by Dr. Chase Caller and her disease has been stable.  She has symptoms of bilateral trochanteric bursitis on examination today.  Per her request I will give her referral to physical therapy.  The plan of care was discussed as noted above.  Bo Merino, MD  Note - This record has been created using Editor, commissioning.  Chart creation errors have been sought, but may not always  have been located. Such creation errors do not reflect on  the standard of medical care.

## 2019-02-27 ENCOUNTER — Ambulatory Visit (INDEPENDENT_AMBULATORY_CARE_PROVIDER_SITE_OTHER): Payer: Medicare Other | Admitting: Rheumatology

## 2019-02-27 ENCOUNTER — Encounter: Payer: Self-pay | Admitting: Rheumatology

## 2019-02-27 ENCOUNTER — Other Ambulatory Visit: Payer: Self-pay

## 2019-02-27 VITALS — BP 103/65 | HR 71 | Resp 13 | Ht 63.5 in | Wt 146.2 lb

## 2019-02-27 DIAGNOSIS — Z8679 Personal history of other diseases of the circulatory system: Secondary | ICD-10-CM

## 2019-02-27 DIAGNOSIS — M7122 Synovial cyst of popliteal space [Baker], left knee: Secondary | ICD-10-CM

## 2019-02-27 DIAGNOSIS — M7062 Trochanteric bursitis, left hip: Secondary | ICD-10-CM

## 2019-02-27 DIAGNOSIS — R899 Unspecified abnormal finding in specimens from other organs, systems and tissues: Secondary | ICD-10-CM | POA: Diagnosis not present

## 2019-02-27 DIAGNOSIS — R918 Other nonspecific abnormal finding of lung field: Secondary | ICD-10-CM

## 2019-02-27 DIAGNOSIS — I7300 Raynaud's syndrome without gangrene: Secondary | ICD-10-CM | POA: Diagnosis not present

## 2019-02-27 DIAGNOSIS — Z8639 Personal history of other endocrine, nutritional and metabolic disease: Secondary | ICD-10-CM

## 2019-02-27 DIAGNOSIS — M7061 Trochanteric bursitis, right hip: Secondary | ICD-10-CM

## 2019-02-27 DIAGNOSIS — M5136 Other intervertebral disc degeneration, lumbar region: Secondary | ICD-10-CM

## 2019-02-27 DIAGNOSIS — J849 Interstitial pulmonary disease, unspecified: Secondary | ICD-10-CM | POA: Diagnosis not present

## 2019-02-27 DIAGNOSIS — M19041 Primary osteoarthritis, right hand: Secondary | ICD-10-CM

## 2019-02-27 DIAGNOSIS — M19042 Primary osteoarthritis, left hand: Secondary | ICD-10-CM

## 2019-02-27 DIAGNOSIS — Z79899 Other long term (current) drug therapy: Secondary | ICD-10-CM | POA: Diagnosis not present

## 2019-02-27 DIAGNOSIS — Z87891 Personal history of nicotine dependence: Secondary | ICD-10-CM

## 2019-02-27 DIAGNOSIS — Z8709 Personal history of other diseases of the respiratory system: Secondary | ICD-10-CM

## 2019-03-20 ENCOUNTER — Other Ambulatory Visit: Payer: Self-pay

## 2019-03-20 ENCOUNTER — Ambulatory Visit (INDEPENDENT_AMBULATORY_CARE_PROVIDER_SITE_OTHER)
Admission: RE | Admit: 2019-03-20 | Discharge: 2019-03-20 | Disposition: A | Discharge status: Home / Self Care | Payer: Medicare Other | Source: Ambulatory Visit | Attending: Pulmonary Disease | Admitting: Pulmonary Disease

## 2019-03-20 DIAGNOSIS — J849 Interstitial pulmonary disease, unspecified: Secondary | ICD-10-CM

## 2019-03-20 NOTE — Progress Notes (Signed)
High-resolution CT chest results have come back.  They are similar to the January/2019 high-resolution CT chest potentially may be slightly progressed per radiology.  5 mm left lower lobe pulmonary nodule is stable from January/2019 CT and favored to be benign.  This favors NSIP.  Also still showing emphysema.  Please get the patient scheduled to see Dr. Chase Caller over the next 6 to 8 weeks where he can review these CT imaging results in more detail.  Wyn Quaker, FNP

## 2019-03-27 ENCOUNTER — Other Ambulatory Visit: Payer: Self-pay | Admitting: Internal Medicine

## 2019-04-05 ENCOUNTER — Ambulatory Visit (INDEPENDENT_AMBULATORY_CARE_PROVIDER_SITE_OTHER): Payer: Medicare Other | Admitting: Internal Medicine

## 2019-04-05 ENCOUNTER — Encounter: Payer: Self-pay | Admitting: Internal Medicine

## 2019-04-05 ENCOUNTER — Other Ambulatory Visit: Payer: Self-pay

## 2019-04-05 VITALS — BP 116/70 | HR 61 | Ht 63.5 in | Wt 146.0 lb

## 2019-04-05 DIAGNOSIS — I7300 Raynaud's syndrome without gangrene: Secondary | ICD-10-CM

## 2019-04-05 DIAGNOSIS — I480 Paroxysmal atrial fibrillation: Secondary | ICD-10-CM

## 2019-04-05 NOTE — Progress Notes (Signed)
Patient Care Team: Kathyrn Lass, MD as PCP - General (Family Medicine) Deboraha Sprang, MD as PCP - Cardiology (Cardiology) Annia Belt, MD as Consulting Physician (Hematology) Deboraha Sprang, MD as Consulting Physician (Cardiology) Richmond Campbell, MD as Consulting Physician (Gastroenterology)   HPI  Tara DECRESCENZO is a 78 y.o. female seen in followup for atrial fibrillation for which he takes flecainide 50 mg twice daily. This occurs  in the context of hemochromatosis.   .        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   Normal nuclear study 4/15  Seen in the ER 1/20 for atrial fibrillation with a rapid rate associated with chest pain and dyspnea.  Cardioverted and discharged.  The patient denies chest pain, shortness of breath, nocturnal dyspnea, orthopnea or peripheral edema.  There have been no palpitations, lightheadedness or syncope.      Date Cr K Hgb  4/18 0.74  12.4  1/20 0.78 3.9 11.4  3/20 0.75 3.8 12.0  8/20 0.65 4.4 12.5   DATE PR interval QRSduration Dose  1/20 164 78 50  3/20 162 92 50  10/20 160 90 100      Chest x-ray had demonstrated pulmonary infiltrate.  She has intercurrently been diagnosed with pulmonary fibrosis CT scan pending for today  Her CHADS-VASc score is 3; age/gender.   anticoagulation with Rivaroxaban   Past Medical History:  Diagnosis Date  . Atrial fibrillation ([name])    a. Flecainide therapy;  b. event monitor 4/12  . Chest pain    a. GXT myoview 4/12: no isch., EF 86%;   b. echo 4/12: EF 55-65%, grade 1 diast dysfxn, LAE  . Depression with anxiety   . GERD (gastroesophageal reflux disease)   . 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 Beryle Beams  . History of syncope   . Hypothyroidism      Past Surgical History:  Procedure Laterality Date  . MOUTH SURGERY    . SHOULDER ARTHROSCOPY Left   . TOTAL ABDOMINAL HYSTERECTOMY    . VARICOSE VEIN SURGERY      Current Outpatient Medications  Medication Sig Dispense Refill  . acetaminophen (TYLENOL) 325 MG tablet Take 650 mg by mouth every 6 (six) hours as needed for mild pain or headache.    . Biotin 1000 MCG tablet Take 1,000 mcg by mouth daily.    . Calcium Carbonate 1500 MG TABS Take 1 tablet by mouth 3 (three) times a week.    . Cholecalciferol (VITAMIN D PO) Take 5,000 Units by mouth daily.     . Cyanocobalamin (VITAMIN B-12 IJ) Inject 1 mL as directed every 30 (thirty) days.    . flecainide (TAMBOCOR) 100 MG tablet Take 1 tablet (100 mg total) by mouth 2 (two) times daily. 90 tablet 3  . folic acid (FOLVITE) 1 MG tablet Take 1 mg by mouth daily.      Marland Kitchen levothyroxine (SYNTHROID, LEVOTHROID) 112 MCG tablet Take 112 mcg by mouth daily.      . methocarbamol (ROBAXIN) 750 MG tablet Take 1 tablet (750 mg total) by mouth at bedtime as needed for muscle spasms. 30 tablet 2  . metoprolol succinate (TOPROL-XL) 25 MG 24 hr tablet TAKE 1 TABLET BY MOUTH  DAILY  90 tablet 2  . mycophenolate (MYFORTIC) 360 MG TBEC EC tablet TAKE 1 TABLET BY MOUTH TWICE DAILY 180 tablet 2  . omeprazole (PRILOSEC) 40 MG capsule Take 40 mg by mouth daily.    . ondansetron (ZOFRAN ODT) 4 MG disintegrating tablet Take 1-2 tablets three times daily as needed 42 tablet 1  . predniSONE (DELTASONE) 5 MG tablet TAKE 1 TABLET(5 MG) BY MOUTH DAILY WITH BREAKFAST 30 tablet 5  . SPIRIVA RESPIMAT 2.5 MCG/ACT AERS INHALE 2 PUFFS INTO THE LUNGS DAILY 4 g 11  . sulfamethoxazole-trimethoprim (BACTRIM DS) 800-160 MG tablet Take 1 tablet by mouth 3 (three) times a week. Takes on M/W/F    . XARELTO 20 MG TABS tablet TAKE 1 TABLET BY MOUTH ONCE DAILY WITH FOOD 90 tablet 2   No current facility-administered medications for this visit.     No Known Allergies  Review of  Systems negative except from HPI and PMH  Physical Exam BP 116/70   Pulse 61   Ht 5' 3.5" (1.613 m)   Wt 146 lb (66.2 kg)   SpO2 92%   BMI 25.46 kg/m  Well developed and nourished in no acute distress HENT normal Neck supple with JVP-  flat  Carotid without bruits Clear Regular rate and rhythm, no murmurs or gallops Abd-soft with active BS No Clubbing cyanosis edema Skin-warm and dry A & Oriented  Grossly normal sensory and motor function  ECG sinus @ 62 16/[birth year]/44   Assessment and  Plan  Atrial Fibrillation :    Raynauds  Pulmonary fibrosis  Hemochromatosis   Low Voltage  Euvolemic continue current meds  .No intercurrent atrial fibrillation or flutter, continue the higher dose of flecainide   On Anticoagulation;  No bleeding issues

## 2019-04-05 NOTE — Patient Instructions (Signed)

## 2019-04-11 ENCOUNTER — Other Ambulatory Visit: Payer: Self-pay

## 2019-04-11 DIAGNOSIS — Z20822 Contact with and (suspected) exposure to covid-19: Secondary | ICD-10-CM

## 2019-04-13 LAB — NOVEL CORONAVIRUS, NAA: SARS-CoV-2, NAA: NOT DETECTED

## 2019-04-18 ENCOUNTER — Ambulatory Visit: Payer: Medicare Other

## 2019-04-22 ENCOUNTER — Ambulatory Visit (INDEPENDENT_AMBULATORY_CARE_PROVIDER_SITE_OTHER): Payer: Medicare Other | Admitting: Internal Medicine

## 2019-04-22 ENCOUNTER — Other Ambulatory Visit: Payer: Self-pay

## 2019-04-22 ENCOUNTER — Encounter: Payer: Self-pay | Admitting: Internal Medicine

## 2019-04-22 VITALS — BP 120/80 | HR 65 | Temp 97.3°F | Ht 63.0 in | Wt 144.4 lb

## 2019-04-22 DIAGNOSIS — I7300 Raynaud's syndrome without gangrene: Secondary | ICD-10-CM

## 2019-04-22 DIAGNOSIS — R768 Other specified abnormal immunological findings in serum: Secondary | ICD-10-CM

## 2019-04-22 DIAGNOSIS — J849 Interstitial pulmonary disease, unspecified: Secondary | ICD-10-CM | POA: Diagnosis not present

## 2019-04-22 DIAGNOSIS — Z5181 Encounter for therapeutic drug level monitoring: Secondary | ICD-10-CM

## 2019-04-22 DIAGNOSIS — Z79899 Other long term (current) drug therapy: Secondary | ICD-10-CM | POA: Diagnosis not present

## 2019-04-22 DIAGNOSIS — J439 Emphysema, unspecified: Secondary | ICD-10-CM

## 2019-04-22 DIAGNOSIS — R7689 Other specified abnormal immunological findings in serum: Secondary | ICD-10-CM

## 2019-04-22 LAB — HEPATIC FUNCTION PANEL
ALT: 11 U/L (ref 0–35)
AST: 14 U/L (ref 0–37)
Albumin: 4.3 g/dL (ref 3.5–5.2)
Alkaline Phosphatase: 48 U/L (ref 39–117)
Bilirubin, Direct: 0.1 mg/dL (ref 0.0–0.3)
Total Bilirubin: 0.6 mg/dL (ref 0.2–1.2)
Total Protein: 6.7 g/dL (ref 6.0–8.3)

## 2019-04-22 LAB — CBC WITH DIFFERENTIAL/PLATELET
Basophils Absolute: 0.1 10*3/uL (ref 0.0–0.1)
Basophils Relative: 0.7 % (ref 0.0–3.0)
Eosinophils Absolute: 0.1 10*3/uL (ref 0.0–0.7)
Eosinophils Relative: 0.8 % (ref 0.0–5.0)
HCT: 37.4 % (ref 36.0–46.0)
Hemoglobin: 12.2 g/dL (ref 12.0–15.0)
Lymphocytes Relative: 13.5 % (ref 12.0–46.0)
Lymphs Abs: 1 10*3/uL (ref 0.7–4.0)
MCHC: 32.5 g/dL (ref 30.0–36.0)
MCV: 103.5 fl — ABNORMAL HIGH (ref 78.0–100.0)
Monocytes Absolute: 0.4 10*3/uL (ref 0.1–1.0)
Monocytes Relative: 4.8 % (ref 3.0–12.0)
Neutro Abs: 6 10*3/uL (ref 1.4–7.7)
Neutrophils Relative %: 80.2 % — ABNORMAL HIGH (ref 43.0–77.0)
Platelets: 247 10*3/uL (ref 150.0–400.0)
RBC: 3.61 Mil/uL — ABNORMAL LOW (ref 3.87–5.11)
RDW: 13.6 % (ref 11.5–15.5)
WBC: 7.5 10*3/uL (ref 4.0–10.5)

## 2019-04-22 LAB — BASIC METABOLIC PANEL
BUN: 11 mg/dL (ref 6–23)
CO2: 29 mEq/L (ref 19–32)
Calcium: 9.2 mg/dL (ref 8.4–10.5)
Chloride: 100 mEq/L (ref 96–112)
Creatinine, Ser: 0.7 mg/dL (ref 0.40–1.20)
GFR: 80.97 mL/min (ref >60.00)
Glucose, Bld: 106 mg/dL — ABNORMAL HIGH (ref 70–99)
Potassium: 4.1 mEq/L (ref 3.5–5.1)
Sodium: 138 mEq/L (ref 135–145)

## 2019-04-22 NOTE — Progress Notes (Signed)
PCP [name],Tara C, PA-[name]   HPI  IOV 02/10/2016  Chief Complaint  Patient presents with   Pulmonary Consult    Pt referred by Dr. Melford [name] for chronic cough x 1 year. Pt states she feels she has a tickle that is causing her dry cough. Pt states she has DOE when climbing stairs. Pt deneis CP/tightness.     78 year old female with hemochromatosis homozygous gene followed by Dr. Beryle [name] (last phlebotomy many years ago and is on serial monitoring) with children and siblings with active disease. She has also atrial fibrillation followed by Dr. Caryl Comes. Reports insidious onset of chronic cough in the last 1 year. It is stable since onset. It fluctuates between mild and severe in severity. It is mostly dry in quality. Mostly present in the daytime but sometimes also wakes her up at night. It is definitely not progressive. It is episodic and present every day. Aggravated by talking sometimes and then the throat feels dry associated with tickle in the throat and also sensation the cough is coming from the upper chest retrosternally and sometimes relieved by drinking water or chewing on a lozenge. Also aggravated by seasonal changes particularly in the spring and the fall which makes her think she has allergies. There is sometimes associated gag. She also reports nonspecific occasional wheezing and associated shortness of breath that is nonspecific but present with exertion and relieved by rest No other clear cut aggravating or relieving factors.   cough associated history  - Medications: She is not on fish oil or ACE inhibitors - Sinus drainage: She does admit to spring allergies. She did have something removed from her hard palate several years ago by ENT does not know details - Acid reflux: She says she is significant acid reflux and is on Prilosec. Without which she'll have significant symptoms - Pulmonary disease: FenO 12 02/10/2016  and normal. She denies any personal history of asthma or  pulmonary fibrosis of COPD or emphysema smoked one pack per day started smoking at age 31 and quit in 1999. Making a 42 pack smoking history. Chest x-ray 08/26/2014 personally visualized is clear - Tobacco :  reports that she quit smoking about 18 years ago. She has a 42.00 pack-year smoking history. She has never used smokeless tobacco.       OV 02/18/2016  Chief Complaint  Patient presents with   Follow-up    Pt here after PFT and HRCT. Pt denies changes in SOB and cough. Pt denies any new complaints at this time.    Follow-up chronic cough. This visit is to follow-up on test results of function test and high resolution CT chest which are described below9 results show isolated reduction in diffusion capacity which can be explained by possible early ILD and also emphysema. There are also new findings of nodule 6 mm bilaterally on lower lobes. She prefers a very cautious approach to this evaluation.     Pulmonary function test 02/12/2016 - FVC 2.8 cm/101%, FEV1 1.9 L/91%. Ratio 60/90%. Total lung capacity 115%. DLCO reduced at 14.5/60%. She is isolated reduction in diffusion capacity    HRCT chest 02/16/16 IMPRESSION: 1. Mild patchy subpleural reticulation in both lungs with a basilar predominance. No significant traction bronchiectasis. No frank honeycombing. Findings could represent an interstitial lung disease such as nonspecific interstitial pneumonia (NSIP), with early usual interstitial pneumonia (UIP) not excluded. A follow-up high-resolution chest CT study in 12 months is recommended to assess temporal pattern stability. 2. Bilateral lower lobe solid  pulmonary nodules, largest 6 mm. Non-contrast chest CT at 3-6 months is recommended. If the nodules are stable at time of repeat CT, then future CT at 18-24 months (from today's scan) is considered optional for low-risk patients, but is recommended for high-risk patients. This recommendation follows the consensus statement:  Guidelines for Management of Incidental Pulmonary Nodules Detected on CT Images:From the Fleischner Society 2017; published online before print (10.1148/radiol.[id number]). 3. Additional findings include mild-to-moderate centrilobular emphysema, aortic atherosclerosis and 2 vessel coronary atherosclerosis.   Electronically Signed   By: Ilona Sorrel M.D.   On: 02/16/2016 14:14   OV 08/31/2016  Chief Complaint  Patient presents with   Follow-up    HRCT was never scheduled. Pt states that the cough is still present >> slightly improved since last OV. Pt states that she feels her allergies has gotten it ramped up again.    Follow-up multifactorial cough associated with autoimmune antibody positive, interstitial lung disease and emphysema previous history of smoking  After last visit in August 2017 she had high resolution CT chest in December 2017 that showed persistence of ILD. I personally visualized the CT chest. He comes for follow-up. She tells me that since August 2017 she has insidious onset of shortness of breath and is progressively worse. It is mild and present only on inclines is not therefore tenderness. In terms of her cough is persistent. It is associated with significant sinus drainage that she thinks is allergy related. She constantly clears her throat. Lab review shows autoimmune antibody positive in August 2017 with scleroderma antibody slightly elevated at 6.4. I referred her to rheumatology but she does not remember this and she did not make this follow-up. She also tells me that she definitely has a long history of [name] phenomena in her fingers but no one has ever formally diagnosed with connective tissu  e disease.  OV 11/28/2016  Chief Complaint  Patient presents with   Follow-up    Pt here after PFT. Pt states her breathing is uchanged since last OV. Pt [name]/o dry cough.- pt states this has slightly improved since last OV. Pt denies CP/tightness and f/[name]/s.      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 Since then she has seen rheumatologis Dr. Keturah Barre. I reviewd the notes and also discussed with the patient wa a good understading of what s going on. She tells me that some of the lupus antibodies have been positive correlating with Raynaud. Patient is on beta blocker for atrial fbrillation and it is the preference by the rheumatologist Dr. D that she switc to calcium channel blocker. She has a follow-up appointment pending with her electrophysiologist Dr. Caryl Comes.  She is scleroderma antibody positive but according to her rheumtolgist she does not have dermatologic  Manifestation of the disese. 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 She pulm  function test today and this is stable. In terms of arrest or symptoms she stable. The cough is actually improved. But she does get dyspneic walking up stairs especially a few flights. She does not wnt her to pulmonary habilitation because se exrcises on the treadmill although she does not monitor her saturations.   reports that she quit smoking about 19 years ago. She has a 42.00 pack-year smoking history. she has never used smokeless tobacco.   OV 06/12/2017  Chief Complaint  Patient presents with   Follow-up    Pt states that she has been doing good since last visit. States that she has a "tickle cough" that is sporadic and has SOB that she states is when she climbs 2 flights of stairs. Denies any CP.    Follow-up combined emphysema was interstitial lung disease [autoimmune undifferentiated connective tissue disease interstitial lung disease]. Autoimmune features from May 2018 rheumatology noted with Dr. Keturah Barre: Raynaud phenomenon positive without gangrene, positive anti-cardiolipin and positive IgM and SCL-70 positive without clinical features of  scleroderma    Last seen June 2018. Since then she's stable. Overall she tells me that only problem is a tickle in her throat and slight cough. This is up and down depending on the pollen exposure she gets. She gets dyspneic for climbing few to several flights of stairs. This is unchanged. She did have full function test today and that shows mild significant worsening though overall gradient is only mild.. However she's not feeling this. She has appointment with Dr. Keturah Barre  pending    OV 07/18/2017  Chief Complaint  Patient presents with   Follow-up    HRCT done 07/03/17.  Pt states she is the same as she was at last visit. SOB with exertion.    Follow-up combined emphysema was interstitial lung disease [autoimmune undifferentiated connective tissue disease interstitial lung disease]. Autoimmune features from May 2018 rheumatology noted with Dr. Keturah Barre: Raynaud phenomenon positive without gangrene, positive anti-cardiolipin and positive IgM and SCL-70 positive without clinical features of scleroderma    VIDHI DELELLIS presents for follow-up.  She is here to discuss the results of a high-resolution CT scan of the chest.  The scan was reviewed by Dr. Rosario Jacks thoracic radiology who feels that patient has probable UIP pattern that is definitely progressive compared to August 2017 although there is no comment about progression since June 2018 CT scan.  The pulmonary nodules itself are stable since August 2017 and are likely benign.  The change in the CT scan which is mild progression pulmonary fibrosis corresponds with the pulmonary function test that shows mild progression.  She is now here with her daughter Junie Panning and her husband.  She also states that 1 of her other daughters has rheumatoid arthritis and is on TNF alpha blockade.  All her 3 daughters have hemo-chromatosis    OV 08/29/2017  Chief Complaint  Patient presents with   Follow-up    Pt states she has been doing good since last visit. Pt  recently went to [place] and had some mild problems with SOB due to heat.  Pt was prescribed cellcept and bactrim but has not yet started meds due to questions with other meds she is taking.    78 year old female with ILD secondary to autoimmune disease clinically suspicious of scleroderma but also previous history of hemochromatosis with family history of hemochromatosis.  She is here with her husband.  This visit is only a discussion visit.  Because she has many questions about starting CellCept before she actually does.  We did hepatitis virus panel, QuantiFERON gold, G6PD and all this is normal.  Lab work is normal.  I checked with [facility] pharmacist about interactions and was cleared for her to start CellCept.  The only recommendation was for patient to come off Prilosec because of reduced effect of CellCept.  Patient questions revolved around CellCept: She does not want to come off PPI because of hiatal hernia and has had bad acid reflux.  So  I would not Willis Modena asking for any alternative PPI.  In addition she states she takes multiple multivitamins including Biotene, vitamin D3, vitamin D, vitamin L37 and folic acid.  She also takes Allegra occasionally.  She wants to make sure it is all okay with CellCept.  She has not had a flu shot today and she is asking if she should have it.  She has never had flu shot before.  She is also wondering about anticoagulation in the setting of CellCept and thyroid issues in the setting of Bactrim.  In terms of her genetics: Initially I corresponded with our local geneticist who thought she might be better served at [facility] but the [facility] genetics person wrote to me saying that patient should first be seen by [facility] rheumatology and hematology.  Patient's not so sure she wants to see the subspecialist.  She will speak to Dr. Beryle [name] her local hematologist to see if it is worthwhile seeing the hematology department at [facility].   Lungs/Pleura: Moderate centrilobular emphysema. Mild basilar predominant subpleural reticulation and ground-glass, increased from 02/16/2016. No traction bronchiectasis/ bronchiolectasis, architectural distortion or honeycombing. Side-by-side nodules in the medial left lower lobe measure up to 6 mm (series 3, image 95), unchanged from 02/16/2016 and considered benign. No air trapping. No pleural fluid. Airway is unremarkable.  IMPRESSION: 1. Mild progression in basilar predominant subpleural fibrosis from 02/16/2016, raising suspicion for usual interstitial pneumonitis. 2. Aortic atherosclerosis (ICD10-170.0). Moderate coronary artery calcification. 3.  Emphysema (ICD10-J43.9).   Electronically Signed   By: Lorin Picket M.D.   On: 07/03/2017 09:54    OV 09/19/2017  Chief Complaint  Patient presents with   Follow-up    Pt states she has been doing good. Currently on cellcept and bactrim and has been doing well on it once put back on omeprazole. Pt still has the cough and sometimes when trying to take a deep breath can't fully get all O2 out.   Layli Capshaw returns for follow-up.  This is for autoimmune interstitial lung disease.  She was started on CellCept recently.  She is here to follow-up for therapeutic drug monitoring.  Because she was on omeprazole and we were initiating CellCept with Bactrim we asked her to hold off on omeprazole because the omeprazole impairs drug levels of CellCept.  However she tried it for a week and her acid reflux was significant despite ranitidine so she went back on omeprazole.  Pharmacist Willis Modena is advising changing the CellCept to equivaekbtdose of Myfortic. So far tolerating cellcept/pred/bactrimm well. Has seen dR G in heme and on observation. Had PFT today on cellcept/pred/bactrim and is better! Continue siwht spriva  OV 10/31/2017  . Chief Complaint  Patient presents with   Follow-up    Pt currently taking  myfortic and states she has been well on that. Pt states she has some mild nausea and has some problems with sleeping at night. SOB is stable,mild coughing. Denies any CP.    Follow-up progressive interstitial lung disease with SCL-70 antibody positive and Raynard Follow-up associated emphysema.  Last visit September 19, 2017.  At that time she had intolerance to the CellCept therefore we switched her to Myfortic.  She is here to report for follow-up about this.  She is tolerating Myfortic just well.  She had some transient insomnia for a few days last week but this resolved.  She only has mild intermittent occasional nausea with the Myfortic but otherwise is tolerating it really well at  the low-dose of 360 mg twice daily.  She continues on prednisone 10 mg daily and Bactrim 3 times a week.  She is due for blood work today.  There are no other new issues.  She continues on Spiriva for her emphysema   OV 02/01/2018  Chief Complaint  Patient presents with   Follow-up    PFT performed today.  Pt stated other than having shingles earlier, things have been doing good for her. Pt states SOB is stable and states she still has an occ cough.    Follow-up progressive interstitial lung disease with SCL-70 antibody positive and Raynud - IPAF Follow-up associated emphysema   SHALLON YAKLIN presents for routine follow-up with interstitial lung disease clinic. She has the above problems. In the interim she tells me she had developed shingles on the right neck but this is resolved. This is despite having zoster vaccine in 2012. She was yet to have the bnnew  shingles vaccine.in any event currently she is feeling well. She only has occasional post residual neuropathic pain. In terms of her shortness of breath it is stable and minimal. Walking desaturation test shows stability. Pulmicort function test below show stability. She is tolerating the mycophenolate myfortic well at 360 mg twice daily and prednisone 10 mg  daily and Bactrim for PCP prophylaxis. Overall she feels stable and feels that it is best medicines on touch in terms of dosing change  OV 08/14/2018  Subjective:  Patient ID: [name], female , DOB: June 24, 1941 , age 17 y.o. , MRN: [id number] , ADDRESS: [place] [id number]   08/14/2018 -   Chief Complaint  Patient presents with   Follow-up    PFT performed today.  Pt states she is slowly getting better after her recent pna. States she still has some issues with SOB and an occ cough. Pt also states that she feels like she has a weight on her chest at times.    Follow-up progressive interstitial lung disease with SCL-70 antibody positive and Raynud - IPAF - on myfortic since march 2019 with pred 10 and bactrim  Follow-up associated emphysema - on spiriva    HPIfortic SHENANDOAH YEATS 78 y.o. -returns for 78-monthfollow-up of her interstitial lung disease associated with emphysema.  She presents with her middle daughter EJunie Panningwho is met me before.  She continues on Myfortic and prednisone 10 mg and Bactrim prophylaxis.  She continues on Spiriva.  She had an episode of atrial fibrillation according to review of the chart and my on history with in the middle of January 2020.  At this time she had a chest x-ray that showed asymptomatic upper lobe pneumonia.  I personally visualized the chest x-ray and confirmed the findings.  Antibiotic was not prescribed.  Then I follow this up with a CT scan of the chest [we canceled her elective colonoscopy].  CT chest showed improved pneumonia findings.  I prescribed even though she was feeling well cephalexin for a week.  She says she took it.  She continues to feel well except she has baseline amount of symptoms that is documented below.  Of note the high-resolution CT chest showed an improvement in ILD compared to a year ago.  In fact her pulmonary function tests are also showing stability/improvement as documented below.  She is  very happy about this outcome with immunosuppression .  However, she is a little bit unsure why she is symptomatic in terms of shortness of breath  and cough.  She is very concerned that her fibrosis is getting worse.  She wants to have closer follow-up than every 6 months.   IMPRESSION: HRCT Jan 2020 1. Vaguely bandlike patchy consolidation and ground-glass opacity at the periphery of the right upper and superior segment right lower lobes, new since 07/03/2017 chest CT, improving compared to 07/10/2018 chest radiograph, favoring resolving pneumonia with evolving postinfectious scarring. 2. Previously described basilar predominant subpleural reticulation and ground-glass attenuation is minimal and appears decreased in the interval. No bronchiectasis or honeycombing. Findings may represent an interstitial lung disease such as nonspecific interstitial pneumonia (NSIP) or less likely early usual interstitial pneumonia (UIP). Findings are indeterminate for UIP per consensus guidelines: Diagnosis of Idiopathic Pulmonary Fibrosis: An Official ATS/ERS/JRS/ALAT Clinical Practice Guideline. [place], Iss 5, [phone], Feb 25 2017. 3. Moderate centrilobular emphysema with mild diffuse bronchial wall thickening, suggesting COPD. 4. Two-vessel coronary atherosclerosis. 5. Small hiatal hernia.  Aortic Atherosclerosis (ICD10-I70.0) and Emphysema (ICD10-J43.9). ROS - per HPI    OV 04/22/2019  Subjective:  Patient ID: [name], female , DOB: 11-08-1940 , age 46 y.o. , MRN: [id number] , ADDRESS: [place] [id number]   Follow-up progressive interstitial lung disease with SCL-70 antibody positive and Raynud - IPAF - on myfortic since march 2019 with pred 5 and bactrim 3x/week  Follow-up associated emphysema - on spiriva   04/22/2019 -   Chief Complaint  Patient presents with   Follow-up    Patient reports that her breathing is doing well  at this time.      HPI IDA MILBRATH 78 y.o. -presents for follow-up.  Last seen by myself in early part of 2020 before the pandemic.  At that time CT chest suggested possible progression.  She saw a nurse practitioner in the interim after the onset of the pandemic.  She was anxious about the progression of her ILD so she asked for a CT scan in less than 1 year.  A CT scan of the chest was done in September 2020.  This is deemed stable since early part of the year but with possible progression versus stability since January 2019.  In terms of her symptoms she continues to be stable with very minimal shortness of breath and cough.  Her pulmonary function test below also shows stability.  Her walking desaturation test also shows stability.  She is tolerating her Myfortic, prednisone and 3 times a week of Bactrim just fine.  There are no side effects.  Last blood work was in August 2020 and this was reviewed.  We discussed the possibility of switching to antifibrotic nintedanib instead of taking immunosuppressants.  The rational for this would be it is a safer drug with less long-term side effect such as lack of immunosuppression.  However she feels her current regimen is working well for her.  She also feels that she would benefit more from a immunosuppressive drug because of her autoimmune antibodies and therefore she is more inclined to continue with her current immunosuppressive regimen of prednisone 5 mg/day and Myfortic and Bactrim 3 times a week.     SYMPTOM SCALE - ILD 08/14/2018  04/22/2019   O2 use *RA   Shortness of Breath 0 -> 5 scale with 5 being worst (score 6 If unable to do)   At rest 1 0  Simple tasks - showers, clothes change, eating, shaving 1.5 0  Household (dishes, doing bed, laundry) 1.5 1  Shopping 1.5 0  Walking level at own pace 2.5 0  Walking keeping up with others of same age 82 0  Walking up Stairs 2.5 1  Walking up Hill 2.5 1  Total (40 - 48) Dyspnea Score 15 3    How bad is your cough? 1.5 1.5  How bad is your fatigue 1.5 1         Results for JERRIYAH, LOUIS (MRN [id number]) as of 08/14/2018 15:32  Ref. Range 02/12/2016 12:57 11/28/2016 10:53 06/12/2017 11:18 09/19/2017 11:48 START Mycophenoalte 02/01/2018 11:55 08/14/2018 13:28 02/18/2019  FVC-Pre Latest Units: L 2.85 2.81 2.68 3.04 2.87 2.92 2.82  FVC-%Pred-Pre Latest Units: % 101 101 97 111 105 108 104%      Results for LUMA, CLOPPER (MRN [id number]) as of 11/28/2016 12:08  Ref. Range 02/12/2016 12:57 11/28/2016 10:53 06/12/2017  09/19/2017  02/01/2018  08/14/2018  02/18/2019  DLCO cor Latest Units: ml/min/mmHg 14.54 13.36 12.17 14.29 13.07 13.19 GLI equation 15.31 - GLI equation  DLCO cor % pred Latest Units: % 60 55 50 59 54% 70% 81%  CT     Jan 2019 - mild prog in CT since jan 2019      Simple office walk 185 feet x  3 laps goal with forehead probe 10/31/2017  02/01/2018  08/14/2018  04/22/2019   O2 used Room air Room air Room air Room air  Number laps completed _0 Comments about pace Normal pace normal normal   Resting Pulse Ox/HR 98% and 63/min 99% and 80/min 100% and 70/min 100% and 65/min  Final Pulse Ox/HR 99% and 80/min 97% and 91/min 99% and 90/min 98% and HR 78/min  Desaturated </= 88% no no no no  Desaturated <= 3% points no no no no  Got Tachycardic >/= 90/min no yes yes no  Symptoms at end of test none Mild dyspnea No dyspnea No dyspnea  Miscellaneous comments none x       IMPRESSION: CT chest Sept 2020 personally visualized the CT chest 1. Very mild basilar predominant subpleural reticulation and ground-glass, similar to minimally progressive from 07/03/2017. Nonspecific interstitial pneumonitis is favored. Findings are indeterminate for UIP per consensus guidelines: Diagnosis of Idiopathic Pulmonary Fibrosis: An Official ATS/ERS/JRS/ALAT Clinical Practice Guideline. [place], Iss 5, [phone], Feb 25 2017. 2. Aortic atherosclerosis  (ICD10-170.0). Coronary artery calcification. 3. Enlarged pulmonic trunk, indicative of pulmonary arterial hypertension. 4.  Emphysema (ICD10-J43.9).   Electronically Signed   By: Lorin Picket M.D.   On: 03/20/2019 14:47  ROS - per HPI     has a past medical history of Atrial fibrillation ([place]), Chest pain, Depression with anxiety, GERD (gastroesophageal reflux disease), Hemochromatosis, History of syncope, and Hypothyroidism.   reports that she quit smoking about 21 years ago. She has a 42.00 pack-year smoking history. She has never used smokeless tobacco.  Past Surgical History:  Procedure Laterality Date   MOUTH SURGERY     SHOULDER ARTHROSCOPY Left    TOTAL ABDOMINAL HYSTERECTOMY     VARICOSE VEIN SURGERY      No Known Allergies  Immunization History  Administered Date(s) Administered   Fluad Quad(high Dose 65+) 02/18/2019   Influenza, High Dose Seasonal PF 08/29/2017, 04/26/2018   Pneumococcal Conjugate-13 06/12/2014   Pneumococcal Polysaccharide-23 06/27/2010   Tdap 03/16/2011   Zoster 06/27/2010   Zoster Recombinat (Shingrix) 04/08/2019    Family History  Problem Relation Age of Onset   Stroke Mother  Heart disease Father 54   Hemochromatosis Brother      Current Outpatient Medications:    acetaminophen (TYLENOL) 325 MG tablet, Take 650 mg by mouth every 6 (six) hours as needed for mild pain or headache., Disp: , Rfl:    Biotin 1000 MCG tablet, Take 1,000 mcg by mouth daily., Disp: , Rfl:    Calcium Carbonate 1500 MG TABS, Take 1 tablet by mouth 3 (three) times a week., Disp: , Rfl:    Cholecalciferol (VITAMIN D PO), Take 5,000 Units by mouth daily. , Disp: , Rfl:    Cyanocobalamin (VITAMIN B-12 IJ), Inject 1 mL as directed every 30 (thirty) days., Disp: , Rfl:    flecainide (TAMBOCOR) 100 MG tablet, Take 1 tablet (100 mg total) by mouth 2 (two) times daily., Disp: 90 tablet, Rfl: 3   folic acid (FOLVITE) 1 MG tablet, Take 1  mg by mouth daily.  , Disp: , Rfl:    levothyroxine (SYNTHROID, LEVOTHROID) 112 MCG tablet, Take 112 mcg by mouth daily.  , Disp: , Rfl:    methocarbamol (ROBAXIN) 750 MG tablet, Take 1 tablet (750 mg total) by mouth at bedtime as needed for muscle spasms., Disp: 30 tablet, Rfl: 2   metoprolol succinate (TOPROL-XL) 25 MG 24 hr tablet, TAKE 1 TABLET BY MOUTH  DAILY, Disp: 90 tablet, Rfl: 2   mycophenolate (MYFORTIC) 360 MG TBEC EC tablet, TAKE 1 TABLET BY MOUTH TWICE DAILY, Disp: 180 tablet, Rfl: 2   omeprazole (PRILOSEC) 40 MG capsule, Take 40 mg by mouth daily., Disp: , Rfl:    ondansetron (ZOFRAN ODT) 4 MG disintegrating tablet, Take 1-2 tablets three times daily as needed, Disp: 42 tablet, Rfl: 1   predniSONE (DELTASONE) 5 MG tablet, TAKE 1 TABLET(5 MG) BY MOUTH DAILY WITH BREAKFAST, Disp: 30 tablet, Rfl: 5   SPIRIVA RESPIMAT 2.5 MCG/ACT AERS, INHALE 2 PUFFS INTO THE LUNGS DAILY, Disp: 4 g, Rfl: 11   sulfamethoxazole-trimethoprim (BACTRIM DS) 800-160 MG tablet, Take 1 tablet by mouth 3 (three) times a week. Takes on M/W/F, Disp: , Rfl:    XARELTO 20 MG TABS tablet, TAKE 1 TABLET BY MOUTH ONCE DAILY WITH FOOD, Disp: 90 tablet, Rfl: 2      Objective:   Vitals:   04/22/19 0901 04/22/19 0902  BP:  120/80  Pulse:  65  Temp: (!) 97.3 F (36.3 [name])   TempSrc: Temporal   SpO2:  100%  Weight: 65.5 kg   Height: _0  (1.6 m)     Estimated body mass index is 25.58 kg/m as calculated from the following:   Height as of this encounter: _1  (1.6 m).   Weight as of this encounter: 65.5 kg.  _2 @  Filed Weights   04/22/19 0901  Weight: 65.5 kg     Physical Exam  General Appearance:    Alert, cooperative, no distress, appears stated age - yes , Deconditioned looking - no , OBESE  - no, Sitting on Wheelchair -  no  Head:    Normocephalic, without obvious abnormality, atraumatic  Eyes:    PERRL, conjunctiva/corneas clear,  Ears:    Normal TM's and external ear canals,  both ears  Nose:   Nares normal, septum midline, mucosa normal, no drainage    or sinus tenderness. OXYGEN ON  - no . Patient is @ ra   Throat:   Lips, mucosa, and tongue normal; teeth and gums normal. Cyanosis on lips - no  Neck:   Supple, symmetrical, trachea midline,  no adenopathy;    thyroid:  no enlargement/tenderness/nodules; no carotid   bruit or JVD  Back:     Symmetric, no curvature, ROM normal, no CVA tenderness  Lungs:     Distress - no , Wheeze no, Barrell Chest - no, Purse lip breathing - no, Crackles - no   Chest Wall:    No tenderness or deformity.    Heart:    Regular rate and rhythm, S1 and S2 normal, no rub   or gallop, Murmur - no  Breast Exam:    NOT DONE  Abdomen:     Soft, non-tender, bowel sounds active all four quadrants,    no masses, no organomegaly. Visceral obesity - no  Genitalia:   NOT DONE  Rectal:   NOT DONE  Extremities:   Extremities - normal, Has Cane - no, Clubbing - no, Edema - no  Pulses:   2+ and symmetric all extremities  Skin:   Stigmata of Connective Tissue Disease - RAYNAUD noramally but not at this moment  Lymph nodes:   Cervical, supraclavicular, and axillary nodes normal  Psychiatric:  Neurologic:   Pleasant - yes, Anxious - no, Flat affect - no  CAm-ICU - neg, Alert and Oriented x 3 - yes, Moves all 4s - yes, Speech - normal, Cognition - intact           Assessment:       ICD-10-CM   1. ILD (interstitial lung disease) ([place])  J84.9   2. 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 [name] phenomenon without gangrene  I73.00   4. High risk medication use  Z79.899   5. Therapeutic drug monitoring  Z51.81   6. Pulmonary emphysema, unspecified emphysema type ([place])  J43.9        Plan:     Patient Instructions  Interstitial pulmonary disease (HCC) Scl-70 antibody positive [name] phenomenon without gangrene High risk medication use Therapeutic drug monitoring  - ILD itself is stable  - cotninue  prednisone to 5 mg per day +   myfortic and bactrim at current dose - we discussed switch of above regimen to ofev which is not an immune suppressant and safer and you decided to give it some thought  - do cbc, bmet, lft 04/22/2019 - avoid CT scans < 1 year in frequency unless clinically indicated    Pulmonary emphysema, unspecified emphysema type ([place]) -  Continue spiriva  Followup 4 months do spirometry and dlco Return to ILD clinic in 4 months     SIGNATURE    Dr. Brand Males, M.D., [name],  Pulmonary and Critical Care Medicine Staff Physician, [place] Director - Interstitial Lung Disease  Program  [facility] [place] [facility] [place], [place], [address]  Pager: [phone], If no answer or between  15:00h - 7:00h: call [phone] Telephone: [phone]  9:29 AM 04/22/2019

## 2019-04-22 NOTE — Patient Instructions (Addendum)
Interstitial pulmonary disease (HCC) Scl-70 antibody positive [name] phenomenon without gangrene High risk medication use Therapeutic drug monitoring  - ILD itself is stable  - cotninue  prednisone to 5 mg per day +  myfortic and bactrim at current dose - we discussed switch of above regimen to ofev which is not an immune suppressant and safer and you decided to give it some thought  - do cbc, bmet, lft 04/22/2019 - avoid CT scans < 1 year in frequency unless clinically indicated -Avoid family clusters greater than 6 people in size    Pulmonary emphysema, unspecified emphysema type ([place]) -  Continue spiriva  Followup 4 months do spirometry and dlco Return to ILD clinic in 4 months

## 2019-04-22 NOTE — Addendum Note (Signed)
Addended by: Suzzanne Cloud E on: 04/22/2019 09:35 AM   Modules accepted: Orders

## 2019-04-23 NOTE — Progress Notes (Signed)
Pt notified. Nothing further needed

## 2019-05-07 ENCOUNTER — Telehealth: Payer: Self-pay | Admitting: Internal Medicine

## 2019-05-07 NOTE — Telephone Encounter (Signed)
Spoke with pt, she stated that MR was telling her about trying OFEV intsead of mycophenolate (MYFORTIC) 360 MG TBEC  Can pt start on OFEV? She had a hepatic function panel on 04/22/2019 and LFT's were normal. MR please advise if we could start the process for OFEV.

## 2019-05-09 NOTE — Telephone Encounter (Signed)
Ok thanks. LEt us do paper work for [facility]

## 2019-05-09 NOTE — Telephone Encounter (Signed)
Her options are  -Continue prednisone with mycophenolate  Or  -Continue prednisone with mycophenolate AAND add nintedanib (ofev)  Or  -Continue prednisone but stop mycophenolate AND add nintedanib (ofev) -the advantage with this is that it is less immunosuppressive but we will have to see if autoimmune features flareup without CellCept.  We can certainly try this approach.  This is what I discussed at last visit  Let me know if she definitely wants to go on the third approach

## 2019-05-09 NOTE — Telephone Encounter (Signed)
Called and spoke with pt letting her know the three different options stated by MR and stated to her the third option is the one that he had discussed with her.  After relaying the options to pt, pt said she would go through with continuing the pred, stopping the myfortic, and adding the OFEV. Stated to pt that there is patient assistance paperwork that she will need to fill out and that I would place it in the mail for pt.  MR, pt does want to go through with the third approach. Please advise. Thanks!

## 2019-05-10 NOTE — Telephone Encounter (Signed)
Will begin paperwork for ofev. Mailing patient assistance forms that pt has to fill out to her address. Nothing further needed.

## 2019-05-22 ENCOUNTER — Telehealth: Payer: Self-pay | Admitting: Internal Medicine

## 2019-05-22 NOTE — Telephone Encounter (Addendum)
[name] did we start a prior authorization on the OFev? I called [facility] and they stated they sent this through covermymeds. The key is below to start authorization.    [place] team can you help with this?

## 2019-05-27 NOTE — Telephone Encounter (Signed)
Submitted a Prior Authorization request to [facility] for Ofev 150mg  via Cover My Meds. Will update once we receive a response.  8:28 AM Beatriz Chancellor, CPhT

## 2019-05-27 NOTE — Telephone Encounter (Signed)
Received notification from [facility] regarding a prior authorization for OFEV 150mg . Authorization has been APPROVED from 05/27/19 to 06/26/20.   Will send document to scan center.  Authorization # [id number] Phone # [phone]  Ran test claim, patient's copay for 1 month is $553.34- she is in catastrophic coverage.  8:40 AM Beatriz Chancellor, CPhT

## 2019-06-03 ENCOUNTER — Telehealth: Payer: Self-pay | Admitting: Pharmacy Technician

## 2019-06-03 NOTE — Telephone Encounter (Signed)
Pharmacy team received [facility] Documents. PA was approved 05/27/19. Called [facility], patient's prescription is in process and they should reach out to patient this week to schedule shipment. Will follow up on Friday.  Phone# [phone]

## 2019-06-04 NOTE — Progress Notes (Signed)
Virtual Visit via Telephone Note  I connected with [name] on 06/05/19 at 10:15 AM EST by telephone and verified that I am speaking with the correct person using two identifiers.  Location: Patient: Home  Provider: Clinic  This service was conducted via virtual visit.   The patient was located at home. I was located in my office.  Consent was obtained prior to the virtual visit and is aware of possible charges through their insurance for this visit.  The patient is an established patient.  Dr. Estanislado Pandy, MD conducted the virtual visit and Hazel Sams, PA-C acted as scribe during the service.  Office staff helped with scheduling follow up visits after the service was conducted.   I discussed the limitations, risks, security and privacy concerns of performing an evaluation and management service by telephone and the availability of in person appointments. I also discussed with the patient that there may be a patient responsible charge related to this service. The patient expressed understanding and agreed to proceed.  CC: [name]  History of Present Illness: Patient is a 78 year old female with past medical history of [name], osteoarthritis, and ILD.  She has had mild episodic symptoms of [name].  She experiences brief color changes and numbness.  She is not having any digital ulcerations. She has not noticed any increased skin tightness.  She has intermittent lower back pain and muscle spasms.  She would like a refill of Robaxin.  She denies any new or worsening symptoms of pulmonary symptoms.  She is taking Myfortic, bactrim, and prednisone 5 mg po daily as prescribed.     Review of Systems  Constitutional: Positive for malaise/fatigue. Negative for fever.  HENT:       +Dry mouth  Eyes: Negative for photophobia, pain, discharge and redness.  Respiratory: Negative for cough, shortness of breath and wheezing.   Cardiovascular: Negative for chest pain and palpitations.   Gastrointestinal: Negative for blood in stool, constipation and diarrhea.  Genitourinary: Negative for dysuria.  Musculoskeletal: Positive for myalgias. Negative for back pain, joint pain and neck pain.  Skin: Negative for rash.       +[name]  Neurological: Negative for dizziness and headaches.  Psychiatric/Behavioral: Negative for depression. The patient has insomnia. The patient is not nervous/anxious.       Observations/Objective: Physical Exam  Constitutional: She is oriented to person, place, and time.  Neurological: She is alert and oriented to person, place, and time.  Psychiatric: Mood, memory, affect and judgment normal.   Patient reports morning stiffness for 15   minutes.   Patient denies nocturnal pain.  Difficulty dressing/grooming: Denies Difficulty climbing stairs: Reports Difficulty getting out of chair: Denies Difficulty using hands for taps, buttons, cutlery, and/or writing: Denies   Assessment and Plan: Visit Diagnoses: [name] phenomenon without gangrene: Scl-70+: She has intermittent symptoms of [name].  She experiences color changes and numbness.  She has not had any digital ulcerations or signs of gangrene.  We discussed the importance of avoiding triggers and keeping her core body temperature warm.  She has no other clinical features of autoimmune disease. She has not noticed any increased skin tightness or thickness. She was advised to notify us if she develops any new or worsening symptoms.  She will follow up in 6 months.   Abnormal laboratory test - Positive anticardiolipin IgM 47, positive beta-2 IgM 31: She is taking Xarelto 20 mg 1 tablet by mouth daily.   ILD (interstitial lung disease) ([place]) -  Scl-70+.  She is followed by Dr. Chase Caller and had an appointment on 04/22/19.  According to Dr. Golden Pop note her ILD is stable and he recommended continuing on  Myfortic, Bactrim, and prednisone 5 mg po daily as prescribed.  Switching her to Dickey Gave  was discussed at that visit.  He recommended avoiding CT scans <1 year in frequency unless clinically indicated.    She is not having any new or worsening pulmonary symptoms.   02/18/2019-pulmonary function test-spirometry with DLCO-FVC 2.82 (104% predicted), ratio 67, FEV1 1.89 (93% predicted), DLCO 15.31 (81% predicted)  HRCT 03/20/19: Very mild basilar predominant subpleural reticulation and ground-glass, similar to minimally progressive from 07/03/17.  Enlarged pulmonic trunk, indicative arterial hypertension.   High risk medication use - She is on Myfortic 360 mg twice daily and prednisone 5 mg daily prescribed by Dr. Chase Caller. CBC, BMP, and hepatic function panel ordered on 04/22/19.   Multiple lung nodules on CT  History of emphysema: She was advised to continue Spiriva.   DDD (degenerative disc disease), lumbar: She has intermittent lower back pain.  She has intermittent symptoms of sciatica.  She takes robaxin 750 mg 1 tablet as needed for muscle spasms.  She will send a refill of low dose Robaxin 500 mg 1 tablet daily as needed for muscle spasms.   Primary osteoarthritis of both hands: She is not having any increased joint pain or joint swelling.  Joint protection and muscle strengthening were discussed.  Trochanteric bursitis of both hips:  She has been going to physical therapy, which has been improving her symptoms significantly.   Synovial cyst of left popliteal space: Resolved   Other medical conditions are listed as follows:   History of hemochromatosis  History of hypothyroidism  History of atrial fibrillation  Former smoker  Follow Up Instructions: She will follow up in 6 months.    I discussed the assessment and treatment plan with the patient. The patient was provided an opportunity to ask questions and all were answered. The patient agreed with the plan and demonstrated an understanding of the instructions.   The patient was advised to call back or seek  an in-person evaluation if the symptoms worsen or if the condition fails to improve as anticipated.  I provided 25 minutes of non-face-to-face time during this encounter.   Bo Merino, MD    Scribed by-  Hazel Sams PA-C

## 2019-06-05 ENCOUNTER — Other Ambulatory Visit: Payer: Self-pay

## 2019-06-05 ENCOUNTER — Telehealth (INDEPENDENT_AMBULATORY_CARE_PROVIDER_SITE_OTHER): Payer: Medicare Other | Admitting: Rheumatology

## 2019-06-05 ENCOUNTER — Encounter: Payer: Self-pay | Admitting: Rheumatology

## 2019-06-05 DIAGNOSIS — M19042 Primary osteoarthritis, left hand: Secondary | ICD-10-CM

## 2019-06-05 DIAGNOSIS — M19041 Primary osteoarthritis, right hand: Secondary | ICD-10-CM

## 2019-06-05 DIAGNOSIS — Z8679 Personal history of other diseases of the circulatory system: Secondary | ICD-10-CM

## 2019-06-05 DIAGNOSIS — Z79899 Other long term (current) drug therapy: Secondary | ICD-10-CM

## 2019-06-05 DIAGNOSIS — R918 Other nonspecific abnormal finding of lung field: Secondary | ICD-10-CM

## 2019-06-05 DIAGNOSIS — Z8639 Personal history of other endocrine, nutritional and metabolic disease: Secondary | ICD-10-CM

## 2019-06-05 DIAGNOSIS — J849 Interstitial pulmonary disease, unspecified: Secondary | ICD-10-CM

## 2019-06-05 DIAGNOSIS — M7061 Trochanteric bursitis, right hip: Secondary | ICD-10-CM

## 2019-06-05 DIAGNOSIS — M7062 Trochanteric bursitis, left hip: Secondary | ICD-10-CM

## 2019-06-05 DIAGNOSIS — I7300 Raynaud's syndrome without gangrene: Secondary | ICD-10-CM | POA: Diagnosis not present

## 2019-06-05 DIAGNOSIS — Z8709 Personal history of other diseases of the respiratory system: Secondary | ICD-10-CM

## 2019-06-05 DIAGNOSIS — M7122 Synovial cyst of popliteal space [Baker], left knee: Secondary | ICD-10-CM

## 2019-06-05 DIAGNOSIS — R899 Unspecified abnormal finding in specimens from other organs, systems and tissues: Secondary | ICD-10-CM | POA: Diagnosis not present

## 2019-06-05 DIAGNOSIS — M5136 Other intervertebral disc degeneration, lumbar region: Secondary | ICD-10-CM

## 2019-06-05 DIAGNOSIS — Z87891 Personal history of nicotine dependence: Secondary | ICD-10-CM

## 2019-06-06 ENCOUNTER — Other Ambulatory Visit: Payer: Self-pay | Admitting: *Deleted

## 2019-06-06 MED ORDER — METHOCARBAMOL 500 MG PO TABS: 500.0000 mg | ORAL_TABLET | Freq: Every day | ORAL | 0 refills | Status: DC | PRN

## 2019-06-07 NOTE — Telephone Encounter (Signed)
Called [facility], they called and spoke to patient who advised them that she is not sure if she is ready to start therapy.  [facility] Phone# [phone]  Called patient and she would like someone to call her to discuss the benefits of taking Ofev versus an immunosuppressive. Patient would also like to know if she decides to move forward with Ofev and stop her current therapy and Ofev does not work, will she be able to restart her current therapy without any issues.  Advised I would relay patient's questions to the pharmacist and we will follow up.  4:07 PM Beatriz Chancellor, CPhT

## 2019-06-11 NOTE — Telephone Encounter (Signed)
Called to address Ofev concerns.  Patient not available and left message with daughter to return call to the office tomorrow.   Mariella Saa, PharmD, [place], [place] Clinical Specialty Pharmacist [phone]  06/11/2019 3:27 PM

## 2019-06-13 NOTE — Telephone Encounter (Signed)
I do not see any reason to not switch because of positive antibodies.  I do not have much knowledge about Ofev.  She will have to discuss this further with the pulmonologist.  She is already on Xarelto also there is no concern about having anticardiolipin antibodies.

## 2019-06-13 NOTE — Telephone Encounter (Signed)
Returned patient's call.  Patient is concerned about switching from Myfortic to [facility].  She understands that Ofev is not immunosuppressant therapy and has a better side effect profile.  She is concerned that she may still need immunosuppressive therapy for her scleroderma.  She would like [name] to consult with Dr. Estanislado Pandy about switching from Myfortic to [facility].  Will send message and update patient when we hear response.  Patient asked if Dickey Gave is effective would she be able to go back to Myfortic.  Advised there are no contraindications to her resuming Myfortic if she can not tolerate Ofev.  Patient verbalized understanding.  In the event that she does proceed with Ofev she is concerned about affording the medication.  [facility] stated that her co-pay was going to be over $500.  Discussed the option of co-pay/grant assistance and patient assistance through the manufacturer.  Patient provided household size and income.  She likely would not qualify for grant assistance.  Discussed moving forward with patient assistance as they have more generous income thresholds and she can submit a letter of financial hardship and list other medical expenses to better reflect her financial need.  Patient verbalized understanding.  She was already given the [facility] patient assistance form.  Sent email with example financial letter of hardship and medical expenses form.  She will complete and return to the office if she decides to move forward with Ofev.  All questions encouraged and answered.  Instructed patient to call with any other questions or concerns.   Mariella Saa, PharmD, [place], [place] Clinical Specialty Pharmacist [phone]  06/13/2019 9:01 AM

## 2019-06-13 NOTE — Telephone Encounter (Signed)
[name]  So in non-IPF progressive phenotype ofev has more evidence for superiority. Also in past she has had significant upper gi issues. Given our disucssion today about anticoagulation safety in seetting of ofev, I think ok to start ofev  Also, if [id number] is copay - certainly can try esbriet to see if that copay is better but suspect will run into same problem  She should maybe come in and see me to have these complex decisions - 30 min slot - ILD clinic

## 2019-06-14 NOTE — Telephone Encounter (Signed)
Patient scheduled with Dr. Chase Caller on 07/04/2019-pr

## 2019-06-18 ENCOUNTER — Other Ambulatory Visit: Payer: Self-pay | Admitting: Family Medicine

## 2019-06-18 ENCOUNTER — Telehealth: Payer: Self-pay | Admitting: Rheumatology

## 2019-06-18 DIAGNOSIS — E049 Nontoxic goiter, unspecified: Secondary | ICD-10-CM

## 2019-06-18 NOTE — Telephone Encounter (Signed)
I LMOM for patient to call, and schedule a follow up around 12/05/2019.

## 2019-06-18 NOTE — Telephone Encounter (Signed)
-----   Message from Shona Needles, RT sent at 06/06/2019 11:38 AM EST ----- Regarding: 6 MONTH F/U

## 2019-06-23 ENCOUNTER — Other Ambulatory Visit: Payer: Self-pay | Admitting: Internal Medicine

## 2019-06-24 ENCOUNTER — Other Ambulatory Visit: Payer: Self-pay | Admitting: Internal Medicine

## 2019-06-24 NOTE — Telephone Encounter (Signed)
Xarelto 20mg  refill request received. Pt is 78 years old, weight- 65.5kg, Crea-0.70 on 04/22/2019, last seen by Dr.Klein on 04/22/2019, Diagnosis-Afib, CrCl-69.40ml/min; Dose is appropriate based on dosing criteria. Will send in refill to requested pharmacy.

## 2019-06-27 ENCOUNTER — Ambulatory Visit
Admission: RE | Admit: 2019-06-27 | Discharge: 2019-06-27 | Disposition: A | Discharge status: Home / Self Care | Payer: Medicare Other | Source: Ambulatory Visit | Attending: Family Medicine | Admitting: Family Medicine

## 2019-06-27 DIAGNOSIS — E049 Nontoxic goiter, unspecified: Secondary | ICD-10-CM

## 2019-07-01 ENCOUNTER — Ambulatory Visit: Payer: Medicare Other | Attending: Internal Medicine

## 2019-07-01 DIAGNOSIS — Z20822 Contact with and (suspected) exposure to covid-19: Secondary | ICD-10-CM

## 2019-07-02 LAB — NOVEL CORONAVIRUS, NAA: SARS-CoV-2, NAA: NOT DETECTED

## 2019-07-04 ENCOUNTER — Ambulatory Visit (INDEPENDENT_AMBULATORY_CARE_PROVIDER_SITE_OTHER): Payer: Medicare Other | Admitting: Internal Medicine

## 2019-07-04 ENCOUNTER — Encounter: Payer: Self-pay | Admitting: Internal Medicine

## 2019-07-04 ENCOUNTER — Other Ambulatory Visit: Payer: Self-pay

## 2019-07-04 ENCOUNTER — Other Ambulatory Visit: Payer: Self-pay | Admitting: Internal Medicine

## 2019-07-04 VITALS — BP 120/66 | HR 74 | Ht 63.0 in | Wt 143.4 lb

## 2019-07-04 DIAGNOSIS — Z5181 Encounter for therapeutic drug level monitoring: Secondary | ICD-10-CM

## 2019-07-04 DIAGNOSIS — I272 Pulmonary hypertension, unspecified: Secondary | ICD-10-CM

## 2019-07-04 DIAGNOSIS — Z79899 Other long term (current) drug therapy: Secondary | ICD-10-CM | POA: Diagnosis not present

## 2019-07-04 DIAGNOSIS — J849 Interstitial pulmonary disease, unspecified: Secondary | ICD-10-CM | POA: Diagnosis not present

## 2019-07-04 DIAGNOSIS — J439 Emphysema, unspecified: Secondary | ICD-10-CM

## 2019-07-04 LAB — CBC WITH DIFFERENTIAL/PLATELET
Basophils Absolute: 0.1 10*3/uL (ref 0.0–0.1)
Basophils Relative: 0.8 % (ref 0.0–3.0)
Eosinophils Absolute: 0.1 10*3/uL (ref 0.0–0.7)
Eosinophils Relative: 1.8 % (ref 0.0–5.0)
HCT: 36.8 % (ref 36.0–46.0)
Hemoglobin: 12 g/dL (ref 12.0–15.0)
Lymphocytes Relative: 13.4 % (ref 12.0–46.0)
Lymphs Abs: 0.8 10*3/uL (ref 0.7–4.0)
MCHC: 32.7 g/dL (ref 30.0–36.0)
MCV: 105.5 fl — ABNORMAL HIGH (ref 78.0–100.0)
Monocytes Absolute: 0.4 10*3/uL (ref 0.1–1.0)
Monocytes Relative: 6.4 % (ref 3.0–12.0)
Neutro Abs: 4.9 10*3/uL (ref 1.4–7.7)
Neutrophils Relative %: 77.6 % — ABNORMAL HIGH (ref 43.0–77.0)
Platelets: 215 10*3/uL (ref 150.0–400.0)
RBC: 3.48 Mil/uL — ABNORMAL LOW (ref 3.87–5.11)
RDW: 13.7 % (ref 11.5–15.5)
WBC: 6.3 10*3/uL (ref 4.0–10.5)

## 2019-07-04 LAB — BASIC METABOLIC PANEL
BUN: 13 mg/dL (ref 6–23)
CO2: 27 mEq/L (ref 19–32)
Calcium: 9.1 mg/dL (ref 8.4–10.5)
Chloride: 104 mEq/L (ref 96–112)
Creatinine, Ser: 0.72 mg/dL (ref 0.40–1.20)
GFR: 78.34 mL/min (ref >60.00)
Glucose, Bld: 100 mg/dL — ABNORMAL HIGH (ref 70–99)
Potassium: 4.1 mEq/L (ref 3.5–5.1)
Sodium: 138 mEq/L (ref 135–145)

## 2019-07-04 LAB — HEPATIC FUNCTION PANEL
ALT: 11 U/L (ref 0–35)
AST: 15 U/L (ref 0–37)
Albumin: 4.2 g/dL (ref 3.5–5.2)
Alkaline Phosphatase: 44 U/L (ref 39–117)
Bilirubin, Direct: 0.1 mg/dL (ref 0.0–0.3)
Total Bilirubin: 0.5 mg/dL (ref 0.2–1.2)
Total Protein: 6.4 g/dL (ref 6.0–8.3)

## 2019-07-04 NOTE — Progress Notes (Signed)
PCP [name],Tara C, PA-C   HPI  IOV 02/10/2016  Chief Complaint  Patient presents with  . Pulmonary Consult    Pt referred by Dr. Melford [name] for chronic cough x 1 year. Pt states she feels she has a tickle that is causing her dry cough. Pt states she has DOE when climbing stairs. Pt deneis CP/tightness.     79 year old female with hemochromatosis homozygous gene followed by Dr. Beryle [name] (last phlebotomy many years ago and is on serial monitoring) with children and siblings with active disease. She has also atrial fibrillation followed by Dr. Caryl [name]. Reports insidious onset of chronic cough in the last 1 year. It is stable since onset. It fluctuates between mild and severe in severity. It is mostly dry in quality. Mostly present in the daytime but sometimes also wakes her up at night. It is definitely not progressive. It is episodic and present every day. Aggravated by talking sometimes and then the throat feels dry associated with tickle in the throat and also sensation the cough is coming from the upper chest retrosternally and sometimes relieved by drinking water or chewing on a lozenge. Also aggravated by seasonal changes particularly in the spring and the fall which makes her think she has allergies. There is sometimes associated gag. She also reports nonspecific occasional wheezing and associated shortness of breath that is nonspecific but present with exertion and relieved by rest No other clear cut aggravating or relieving factors.   cough associated history  - Medications: She is not on fish oil or ACE inhibitors - Sinus drainage: She does admit to spring allergies. She did have something removed from her hard palate several years ago by ENT does not know details - Acid reflux: She says she is significant acid reflux and is on Prilosec. Without which she'll have significant symptoms - Pulmonary disease: FenO 12 02/10/2016  and normal. She denies any personal history of asthma or  pulmonary fibrosis of COPD or emphysema smoked one pack per day started smoking at age 46 and quit in 1999. Making a 42 pack smoking history. Chest x-ray 08/26/2014 personally visualized is clear - Tobacco :  reports that she quit smoking about 18 years ago. She has a 42.00 pack-year smoking history. She has never used smokeless tobacco.       OV 02/18/2016  Chief Complaint  Patient presents with  . Follow-up    Pt here after PFT and HRCT. Pt denies changes in SOB and cough. Pt denies any new complaints at this time.    Follow-up chronic cough. This visit is to follow-up on test results of function test and high resolution CT chest which are described below9 results show isolated reduction in diffusion capacity which can be explained by possible early ILD and also emphysema. There are also new findings of nodule 6 mm bilaterally on lower lobes. She prefers a very cautious approach to this evaluation.     Pulmonary function test 02/12/2016 - FVC 2.8 cm/101%, FEV1 1.9 L/91%. Ratio 60/90%. Total lung capacity 115%. DLCO reduced at 14.5/60%. She is isolated reduction in diffusion capacity    HRCT chest 02/16/16 IMPRESSION: 1. Mild patchy subpleural reticulation in both lungs with a basilar predominance. No significant traction bronchiectasis. No frank honeycombing. Findings could represent an interstitial lung disease such as nonspecific interstitial pneumonia (NSIP), with early usual interstitial pneumonia (UIP) not excluded. A follow-up high-resolution chest CT study in 12 months is recommended to assess temporal pattern stability. 2. Bilateral lower lobe solid  pulmonary nodules, largest 6 mm. Non-contrast chest CT at 3-6 months is recommended. If the nodules are stable at time of repeat CT, then future CT at 18-24 months (from today's scan) is considered optional for low-risk patients, but is recommended for high-risk patients. This recommendation follows the consensus statement:  Guidelines for Management of Incidental Pulmonary Nodules Detected on CT Images:From the Fleischner Society 2017; published online before print (10.1148/radiol.[id number]). 3. Additional findings include mild-to-moderate centrilobular emphysema, aortic atherosclerosis and 2 vessel coronary atherosclerosis.   Electronically Signed   By: Ilona Sorrel M.D.   On: 02/16/2016 14:14   OV 08/31/2016  Chief Complaint  Patient presents with  . Follow-up    HRCT was never scheduled. Pt states that the cough is still present >> slightly improved since last OV. Pt states that she feels her allergies has gotten it ramped up again.    Follow-up multifactorial cough associated with autoimmune antibody positive, interstitial lung disease and emphysema previous history of smoking  After last visit in August 2017 she had high resolution CT chest in December 2017 that showed persistence of ILD. I personally visualized the CT chest. He [name] for follow-up. She tells me that since August 2017 she has insidious onset of shortness of breath and is progressively worse. It is mild and present only on inclines is not therefore tenderness. In terms of her cough is persistent. It is associated with significant sinus drainage that she thinks is allergy related. She constantly clears her throat. Lab review shows autoimmune antibody positive in August 2017 with scleroderma antibody slightly elevated at 6.4. I referred her to rheumatology but she does not remember this and she did not make this follow-up. She also tells me that she definitely has a long history of [name] phenomena in her fingers but no one has ever formally diagnosed with connective tissu  e disease.  OV 11/28/2016  Chief Complaint  Patient presents with  . Follow-up    Pt here after PFT. Pt states her breathing is uchanged since last OV. Pt c/o dry cough.- pt states this has slightly improved since last OV. Pt denies CP/tightness and f/c/s.      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 Since then she has seen rheumatologis Dr. Keturah Barre. I reviewd the notes and also discussed with the patient wa a good understading of what s going on. She tells me that some of the lupus antibodies have been positive correlating with Raynaud. Patient is on beta blocker for atrial fbrillation and it is the preference by the rheumatologist Dr. D that she switc to calcium channel blocker. She has a follow-up appointment pending with her electrophysiologist Dr. Caryl [name].  She is scleroderma antibody positive but according to her rheumtolgist she does not have dermatologic  Manifestation of the disese. 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 She pulm  function test today and this is stable. In terms of arrest or symptoms she stable. The cough is actually improved. But she does get dyspneic walking up stairs especially a few flights. She does not wnt her to pulmonary habilitation because se exrcises on the treadmill although she does not monitor her saturations.   reports that she quit smoking about 19 years ago. She has a 42.00 pack-year smoking history. she has never used smokeless tobacco.   OV 06/12/2017  Chief Complaint  Patient presents with  . Follow-up    Pt states that she has been doing good since last visit. States that she has a "tickle cough" that is sporadic and has SOB that she states is when she climbs 2 flights of stairs. Denies any CP.    Follow-up combined emphysema was interstitial lung disease [autoimmune undifferentiated connective tissue disease interstitial lung disease]. Autoimmune features from May 2018 rheumatology noted with Dr. Keturah Barre: Raynaud phenomenon positive without gangrene, positive anti-cardiolipin and positive IgM and SCL-70 positive without clinical features of  scleroderma    Last seen June 2018. Since then she's stable. Overall she tells me that only problem is a tickle in her throat and slight cough. This is up and down depending on the pollen exposure she gets. She gets dyspneic for climbing few to several flights of stairs. This is unchanged. She did have full function test today and that shows mild significant worsening though overall gradient is only mild.. However she's not feeling this. She has appointment with Dr. Keturah Barre  pending    OV 07/18/2017  Chief Complaint  Patient presents with  . Follow-up    HRCT done 07/03/17.  Pt states she is the same as she was at last visit. SOB with exertion.    Follow-up combined emphysema was interstitial lung disease [autoimmune undifferentiated connective tissue disease interstitial lung disease]. Autoimmune features from May 2018 rheumatology noted with Dr. Keturah Barre: Raynaud phenomenon positive without gangrene, positive anti-cardiolipin and positive IgM and SCL-70 positive without clinical features of scleroderma    LILLYANNE BRADBURN presents for follow-up.  She is here to discuss the results of a high-resolution CT scan of the chest.  The scan was reviewed by Dr. Rosario Jacks thoracic radiology who feels that patient has probable UIP pattern that is definitely progressive compared to August 2017 although there is no comment about progression since June 2018 CT scan.  The pulmonary nodules itself are stable since August 2017 and are likely benign.  The change in the CT scan which is mild progression pulmonary fibrosis corresponds with the pulmonary function test that shows mild progression.  She is now here with her daughter Junie Panning and her husband.  She also states that 1 of her other daughters has rheumatoid arthritis and is on TNF alpha blockade.  All her 3 daughters have hemo-chromatosis    OV 08/29/2017  Chief Complaint  Patient presents with  . Follow-up    Pt states she has been doing good since last visit. Pt  recently went to [place] and had some mild problems with SOB due to heat.  Pt was prescribed cellcept and bactrim but has not yet started meds due to questions with other meds she is taking.    79 year old female with ILD secondary to autoimmune disease clinically suspicious of scleroderma but also previous history of hemochromatosis with family history of hemochromatosis.  She is here with her husband.  This visit is only a discussion visit.  Because she has many questions about starting CellCept before she actually does.  We did hepatitis virus panel, QuantiFERON gold, G6PD and all this is normal.  Lab work is normal.  I checked with [facility] pharmacist about interactions and was cleared for her to start CellCept.  The only recommendation was for patient to come off Prilosec because of reduced effect of CellCept.  Patient questions revolved around CellCept: She does not want to come off PPI because of hiatal hernia and has had bad acid reflux.  So  I would not Willis Modena asking for any alternative PPI.  In addition she states she takes multiple multivitamins including Biotene, vitamin D3, vitamin D, vitamin L89 and folic acid.  She also takes Allegra occasionally.  She wants to make sure it is all okay with CellCept.  She has not had a flu shot today and she is asking if she should have it.  She has never had flu shot before.  She is also wondering about anticoagulation in the setting of CellCept and thyroid issues in the setting of Bactrim.  In terms of her genetics: Initially I corresponded with our local geneticist who thought she might be better served at [facility] but the [facility] genetics person wrote to me saying that patient should first be seen by [facility] rheumatology and hematology.  Patient's not so sure she wants to see the subspecialist.  She will speak to Dr. Beryle [name] her local hematologist to see if it is worthwhile seeing the hematology department at [facility].   Lungs/Pleura: Moderate centrilobular emphysema. Mild basilar predominant subpleural reticulation and ground-glass, increased from 02/16/2016. No traction bronchiectasis/ bronchiolectasis, architectural distortion or honeycombing. Side-by-side nodules in the medial left lower lobe measure up to 6 mm (series 3, image 95), unchanged from 02/16/2016 and considered benign. No air trapping. No pleural fluid. Airway is unremarkable.  IMPRESSION: 1. Mild progression in basilar predominant subpleural fibrosis from 02/16/2016, raising suspicion for usual interstitial pneumonitis. 2. Aortic atherosclerosis (ICD10-170.0). Moderate coronary artery calcification. 3.  Emphysema (ICD10-J43.9).   Electronically Signed   By: Lorin Picket M.D.   On: 07/03/2017 09:54    OV 09/19/2017  Chief Complaint  Patient presents with  . Follow-up    Pt states she has been doing good. Currently on cellcept and bactrim and has been doing well on it once put back on omeprazole. Pt still has the cough and sometimes when trying to take a deep breath can't fully get all O2 out.   Anyiah Coverdale returns for follow-up.  This is for autoimmune interstitial lung disease.  She was started on CellCept recently.  She is here to follow-up for therapeutic drug monitoring.  Because she was on omeprazole and we were initiating CellCept with Bactrim we asked her to hold off on omeprazole because the omeprazole impairs drug levels of CellCept.  However she tried it for a week and her acid reflux was significant despite ranitidine so she went back on omeprazole.  Pharmacist Willis Modena is advising changing the CellCept to equivaekbtdose of Myfortic. So far tolerating cellcept/pred/bactrimm well. Has seen dR G in heme and on observation. Had PFT today on cellcept/pred/bactrim and is better! Continue siwht spriva  OV 10/31/2017  . Chief Complaint  Patient presents with  . Follow-up    Pt currently taking  myfortic and states she has been well on that. Pt states she has some mild nausea and has some problems with sleeping at night. SOB is stable,mild coughing. Denies any CP.    Follow-up progressive interstitial lung disease with SCL-70 antibody positive and Raynard Follow-up associated emphysema.  Last visit September 19, 2017.  At that time she had intolerance to the CellCept therefore we switched her to Myfortic.  She is here to report for follow-up about this.  She is tolerating Myfortic just well.  She had some transient insomnia for a few days last week but this resolved.  She only has mild intermittent occasional nausea with the Myfortic but otherwise is tolerating it really well at  the low-dose of 360 mg twice daily.  She continues on prednisone 10 mg daily and Bactrim 3 times a week.  She is due for blood work today.  There are no other new issues.  She continues on Spiriva for her emphysema   OV 02/01/2018  Chief Complaint  Patient presents with  . Follow-up    PFT performed today.  Pt stated other than having shingles earlier, things have been doing good for her. Pt states SOB is stable and states she still has an occ cough.    Follow-up progressive interstitial lung disease with SCL-70 antibody positive and Raynud - IPAF Follow-up associated emphysema   MARYLEN ZUK presents for routine follow-up with interstitial lung disease clinic. She has the above problems. In the interim she tells me she had developed shingles on the right neck but this is resolved. This is despite having zoster vaccine in 2012. She was yet to have the bnnew  shingles vaccine.in any event currently she is feeling well. She only has occasional post residual neuropathic pain. In terms of her shortness of breath it is stable and minimal. Walking desaturation test shows stability. Pulmicort function test below show stability. She is tolerating the mycophenolate myfortic well at 360 mg twice daily and prednisone 10 mg  daily and Bactrim for PCP prophylaxis. Overall she feels stable and feels that it is best medicines on touch in terms of dosing change  OV 08/14/2018  Subjective:  Patient ID: [name], female , DOB: 1940-07-28 , age 65 y.o. , MRN: [id number] , ADDRESS: [place] [id number]   08/14/2018 -   Chief Complaint  Patient presents with  . Follow-up    PFT performed today.  Pt states she is slowly getting better after her recent pna. States she still has some issues with SOB and an occ cough. Pt also states that she feels like she has a weight on her chest at times.    Follow-up progressive interstitial lung disease with SCL-70 antibody positive and Raynud - IPAF - on myfortic since march 2019 with pred 10 and bactrim  Follow-up associated emphysema - on spiriva    HPIfortic PATSY ZARAGOZA 79 y.o. -returns for 17-monthfollow-up of her interstitial lung disease associated with emphysema.  She presents with her middle daughter EJunie Panningwho is met me before.  She continues on Myfortic and prednisone 10 mg and Bactrim prophylaxis.  She continues on Spiriva.  She had an episode of atrial fibrillation according to review of the chart and my on history with in the middle of January 2020.  At this time she had a chest x-ray that showed asymptomatic upper lobe pneumonia.  I personally visualized the chest x-ray and confirmed the findings.  Antibiotic was not prescribed.  Then I follow this up with a CT scan of the chest [we canceled her elective colonoscopy].  CT chest showed improved pneumonia findings.  I prescribed even though she was feeling well cephalexin for a week.  She says she took it.  She continues to feel well except she has baseline amount of symptoms that is documented below.  Of note the high-resolution CT chest showed an improvement in ILD compared to a year ago.  In fact her pulmonary function tests are also showing stability/improvement as documented below.  She is  very happy about this outcome with immunosuppression .  However, she is a little bit unsure why she is symptomatic in terms of shortness of breath  and cough.  She is very concerned that her fibrosis is getting worse.  She wants to have closer follow-up than every 6 months.   IMPRESSION: HRCT Jan 2020 1. Vaguely bandlike patchy consolidation and ground-glass opacity at the periphery of the right upper and superior segment right lower lobes, new since 07/03/2017 chest CT, improving compared to 07/10/2018 chest radiograph, favoring resolving pneumonia with evolving postinfectious scarring. 2. Previously described basilar predominant subpleural reticulation and ground-glass attenuation is minimal and appears decreased in the interval. No bronchiectasis or honeycombing. Findings may represent an interstitial lung disease such as nonspecific interstitial pneumonia (NSIP) or less likely early usual interstitial pneumonia (UIP). Findings are indeterminate for UIP per consensus guidelines: Diagnosis of Idiopathic Pulmonary Fibrosis: An Official ATS/ERS/JRS/ALAT Clinical Practice Guideline. [place], Iss 5, [id number], Feb 25 2017. 3. Moderate centrilobular emphysema with mild diffuse bronchial wall thickening, suggesting COPD. 4. Two-vessel coronary atherosclerosis. 5. Small hiatal hernia.  Aortic Atherosclerosis (ICD10-I70.0) and Emphysema (ICD10-J43.9). ROS - per HPI    OV 04/22/2019  Subjective:  Patient ID: [name], female , DOB: Apr 21, 1941 , age 61 y.o. , MRN: [id number] , ADDRESS: [place] [id number]   Follow-up progressive interstitial lung disease with SCL-70 antibody positive and Raynud - IPAF - on myfortic since march 2019 with pred 5 and bactrim 3x/week  Follow-up associated emphysema - on spiriva   04/22/2019 -   Chief Complaint  Patient presents with  . Follow-up    Patient reports that her breathing is doing well  at this time.      HPI BERDIA LACHMAN 79 y.o. -presents for follow-up.  Last seen by myself in early part of 2020 before the pandemic.  At that time CT chest suggested possible progression.  She saw a nurse practitioner in the interim after the onset of the pandemic.  She was anxious about the progression of her ILD so she asked for a CT scan in less than 1 year.  A CT scan of the chest was done in September 2020.  This is deemed stable since early part of the year but with possible progression versus stability since January 2019.  In terms of her symptoms she continues to be stable with very minimal shortness of breath and cough.  Her pulmonary function test below also shows stability.  Her walking desaturation test also shows stability.  She is tolerating her Myfortic, prednisone and 3 times a week of Bactrim just fine.  There are no side effects.  Last blood work was in August 2020 and this was reviewed.  We discussed the possibility of switching to antifibrotic nintedanib instead of taking immunosuppressants.  The rational for this would be it is a safer drug with less long-term side effect such as lack of immunosuppression.  However she feels her current regimen is working well for her.  She also feels that she would benefit more from a immunosuppressive drug because of her autoimmune antibodies and therefore she is more inclined to continue with her current immunosuppressive regimen of prednisone 5 mg/day and Myfortic and Bactrim 3 times a week.        OV 07/04/2019  Subjective:  Patient ID: [name], female , DOB: [birth year] , age 29 y.o. , MRN: [id number] , ADDRESS: [place] [id number]   Follow-up mildy progressive - stable interstitial lung disease with SCL-70 antibody positive and Raynud - IPAF - on myfortic since march 2019  with pred 5 and bactrim 3x/week  Follow-up associated emphysema  Follow-up Raynard  Has associated hemochromatosis  Has  associated atrial fibrillation on Xarelto   07/04/2019 -   Chief Complaint  Patient presents with  . Follow-up    Pt states she has been doing well since last visit and denies any complaints.     HPI POETRY CERRO 79 y.o. -last visit was in October 2020.  At that time she was doing well.  Given her continued immunosuppression with prednisone and Myfortic we discussed the possibility of switching the Myfortic to the nintedanib but understanding that there uncertainties whether this is superior approach and if she could end up with temporary side effects of the nintedanib.  Based on that she decided to continue with prednisone, Myfortic and Bactrim as she was doing because things are working well.  However she later called in and wanted this change to nintedanib.  We did this change to nintedanib but then her co-pay ended up being over 500+ dollars and then she started also getting worried about the side effects with nintedanib particularly GI.  She is also on Xarelto for atrial fibrillation.  Therefore she made this visit ahead of schedule to discuss the possibility of making the switch.  I understand from her that it will be 500+ dollars every month with nintedanib as a co-pay.  In addition she is worried about the superiority of taking nintedanib.  She fully understands that Myfortic over length of time can carry cancer risk and immunosuppression risk but at this point in time she feels well and she is tolerating it well.  She also understands that nintedanib can carry temporary GI side effects.  She also understands that it may not be a superior approach.  She understands it could be an equivalent approach and definitely lower immunosuppression.  Her main systemic immunosuppressive features are lung disease and Raynaud.  She is worried this might flareup.  In terms of her symptoms there is stable as documented below.  Her walking desaturation test is also stable.   Last pulmonary function test  was in August r 2020 Last CT scan of the chest was in September 2020 Last blood work October 2020 Last echocardiogram 2012.     SYMPTOM SCALE - ILD 08/14/2018  04/22/2019  07/04/2019   O2 use *RA    Shortness of Breath 0 -> 5 scale with 5 being worst (score 6 If unable to do)    At rest 1 0 0  Simple tasks - showers, clothes change, eating, shaving 1.5 0 .5  Household (dishes, doing bed, laundry) 1.5 1 0.5  Shopping 1.5 0 0/5  Walking level at own pace 2.5 0 0.5  Walking keeping up with others of same age 20 0 0.5  Walking up Stairs 2.5 1 0.5  Walking up Hill 2.5 1 1.5  Total (40 - 48) Dyspnea Score 15 3 4.5  How bad is your cough? 1.5 1.5 0.5  How bad is your fatigue 1.'5 1 1    '$ Simple office walk 185 feet x  3 laps goal with forehead probe 07/04/2019   O2 used ra  Number laps completed 3  Comments about pace avg  Resting Pulse Ox/HR 98% and 74/min  Final Pulse Ox/HR 98% and 87/min  Desaturated </= 88% no  Desaturated <= 3% points no  Got Tachycardic >/= 90/min no  Symptoms at end of test Mild dyspnea  Miscellaneous comments x      Results for  KAISLEY, STIVERSON (MRN [id number]) as of 07/04/2019 10:36  Ref. Range 02/12/2016 12:57 11/28/2016 10:53 06/12/2017 11:18 09/19/2017 11:48 02/01/2018 12:09 08/14/2018 13:28 02/18/2019 14:10  FVC-Pre Latest Units: L 2.85 2.81 2.68 3.04 2.87 2.92 2.82  FVC-%Pred-Pre Latest Units: % 101 101 97 111 105 108 104   Results for GRAYSEN, DEPAULA (MRN [id number]) as of 07/04/2019 10:36  Ref. Range 02/12/2016 12:57 11/28/2016 10:53 06/12/2017 11:18 09/19/2017 11:48 02/01/2018 12:09 08/14/2018 13:28 02/18/2019 14:10  DLCO unc Latest Units: ml/min/mmHg 14.07 12.93 12.17 13.83 13.07 13.19 15.31  DLCO unc % pred Latest Units: % 58 54 50 57 54 70 81     IMPRESSION: 1. Very mild basilar predominant subpleural reticulation and ground-glass, similar to minimally progressive from 07/03/2017. Nonspecific interstitial pneumonitis is favored. Findings  are indeterminate for UIP per consensus guidelines: Diagnosis of Idiopathic Pulmonary Fibrosis: An Official ATS/ERS/JRS/ALAT Clinical Practice Guideline. [place], Iss 5, [id number], Feb 25 2017. 2. Aortic atherosclerosis (ICD10-170.0). Coronary artery calcification. 3. Enlarged pulmonic trunk, indicative of pulmonary arterial hypertension. 4.  Emphysema (ICD10-J43.9).   Electronically Signed   By: Lorin Picket M.D.   On: 03/20/2019 14:47  ROS - per HPI     has a past medical history of Atrial fibrillation ([place]), Chest pain, Depression with anxiety, GERD (gastroesophageal reflux disease), Hemochromatosis, History of syncope, and Hypothyroidism.   reports that she quit smoking about 22 years ago. She has a 42.00 pack-year smoking history. She has never used smokeless tobacco.  Past Surgical History:  Procedure Laterality Date  . MOUTH SURGERY    . SHOULDER ARTHROSCOPY Left   . TOTAL ABDOMINAL HYSTERECTOMY    . VARICOSE VEIN SURGERY      No Known Allergies  Immunization History  Administered Date(s) Administered  . Fluad Quad(high Dose 65+) 02/18/2019  . Influenza, High Dose Seasonal PF 08/29/2017, 04/26/2018  . Pneumococcal Conjugate-13 06/12/2014  . Pneumococcal Polysaccharide-23 06/27/2010  . Tdap 03/16/2011  . Zoster 06/27/2010  . Zoster Recombinat (Shingrix) 04/08/2019    Family History  Problem Relation Age of Onset  . Stroke Mother   . Heart disease Father 41  . Hemochromatosis Brother      Current Outpatient Medications:  .  acetaminophen (TYLENOL) 325 MG tablet, Take 650 mg by mouth every 6 (six) hours as needed for mild pain or headache., Disp: , Rfl:  .  atorvastatin (LIPITOR) 10 MG tablet, Take 10 mg by mouth daily., Disp: , Rfl:  .  Biotin 1000 MCG tablet, Take 1,000 mcg by mouth daily., Disp: , Rfl:  .  Calcium Carbonate 1500 MG TABS, Take 1 tablet by mouth 3 (three) times a week., Disp: , Rfl:  .  Cholecalciferol  (VITAMIN D PO), Take 5,000 Units by mouth daily. , Disp: , Rfl:  .  Cyanocobalamin (VITAMIN B 12 PO), Take 1,000 mcg by mouth daily., Disp: , Rfl:  .  flecainide (TAMBOCOR) 100 MG tablet, Take 1 tablet (100 mg total) by mouth 2 (two) times daily., Disp: 90 tablet, Rfl: 3 .  folic acid (FOLVITE) 1 MG tablet, Take 1 mg by mouth daily.  , Disp: , Rfl:  .  levothyroxine (SYNTHROID, LEVOTHROID) 112 MCG tablet, Take 112 mcg by mouth daily.  , Disp: , Rfl:  .  methocarbamol (ROBAXIN) 500 MG tablet, Take 1 tablet (500 mg total) by mouth daily as needed., Disp: 30 tablet, Rfl: 0 .  methocarbamol (ROBAXIN) 750 MG tablet, Take 1 tablet (750 mg total) by mouth  at bedtime as needed for muscle spasms., Disp: 30 tablet, Rfl: 2 .  metoprolol succinate (TOPROL-XL) 25 MG 24 hr tablet, TAKE 1 TABLET BY MOUTH  DAILY, Disp: 90 tablet, Rfl: 2 .  mycophenolate (MYFORTIC) 360 MG TBEC EC tablet, TAKE 1 TABLET BY MOUTH TWICE DAILY, Disp: 180 tablet, Rfl: 2 .  omeprazole (PRILOSEC) 40 MG capsule, Take 40 mg by mouth daily., Disp: , Rfl:  .  ondansetron (ZOFRAN ODT) 4 MG disintegrating tablet, Take 1-2 tablets three times daily as needed, Disp: 42 tablet, Rfl: 1 .  predniSONE (DELTASONE) 5 MG tablet, TAKE 1 TABLET(5 MG) BY MOUTH DAILY WITH BREAKFAST, Disp: 30 tablet, Rfl: 5 .  SPIRIVA RESPIMAT 2.5 MCG/ACT AERS, INHALE 2 PUFFS INTO THE LUNGS DAILY, Disp: 4 g, Rfl: 11 .  sulfamethoxazole-trimethoprim (BACTRIM DS) 800-160 MG tablet, Take 1 tablet by mouth 3 (three) times a week. Takes on M/W/F, Disp: , Rfl:  .  XARELTO 20 MG TABS tablet, TAKE 1 TABLET BY MOUTH ONCE DAILY WITH FOOD, Disp: 90 tablet, Rfl: 2      Objective:   Vitals:   07/04/19 1007  BP: 120/66  Pulse: 74  SpO2: 98%  Weight: 143 lb 6.4 oz (65 kg)  Height: '5\' 3"'$  (1.6 m)    Estimated body mass index is 25.4 kg/m as calculated from the following:   Height as of this encounter: '5\' 3"'$  (1.6 m).   Weight as of this encounter: 143 lb 6.4 oz (65  kg).  '@WEIGHTCHANGE'$ @  [employer]   07/04/19 1007  Weight: 143 lb 6.4 oz (65 kg)     Physical Exam  General Appearance:    Alert, cooperative, no distress, appears stated age - yes , Deconditioned looking - no , OBESE  - no, Sitting on Wheelchair -  no  Head:    Normocephalic, without obvious abnormality, atraumatic  Eyes:    PERRL, conjunctiva/corneas clear,  Ears:    Normal TM's and external ear canals, both ears  Nose:   Nares normal, septum midline, mucosa normal, no drainage    or sinus tenderness. OXYGEN ON  - no . Patient is @ ra   Throat:   Lips, mucosa, and tongue normal; teeth and gums normal. Cyanosis on lips - no  Neck:   Supple, symmetrical, trachea midline, no adenopathy;    thyroid:  no enlargement/tenderness/nodules; no carotid   bruit or JVD  Back:     Symmetric, no curvature, ROM normal, no CVA tenderness  Lungs:     Distress - no , Wheeze no, Barrell Chest - no, Purse lip breathing - no, Crackles - no   Chest Wall:    No tenderness or deformity.    Heart:    Regular rate and rhythm, S1 and S2 normal, no rub   or gallop, Murmur - no  Breast Exam:    NOT DONE  Abdomen:     Soft, non-tender, bowel sounds active all four quadrants,    no masses, no organomegaly. Visceral obesity - no  Genitalia:   NOT DONE  Rectal:   NOT DONE  Extremities:   Extremities - normal, Has Cane - no, Clubbing - no, Edema - no  Pulses:   2+ and symmetric all extremities  Skin:   Stigmata of Connective Tissue Disease - YES RAYNUD  Lymph nodes:   Cervical, supraclavicular, and axillary nodes normal  Psychiatric:  Neurologic:   Pleasant - yes, Anxious - no, Flat affect - no  CAm-ICU - neg, Alert  and Oriented x 3 - yes, Moves all 4s - yes, Speech - normal, Cognition - intact           Assessment:       ICD-10-CM   1. ILD (interstitial lung disease) ([place])  J84.9 ECHOCARDIOGRAM COMPLETE    CBC w/Diff    Basic Metabolic Panel (BMET)    Hepatic function panel  2. High risk  medication use  Z79.899 CBC w/Diff    Basic Metabolic Panel (BMET)    Hepatic function panel  3. Therapeutic drug monitoring  Z51.81 CBC w/Diff    Basic Metabolic Panel (BMET)    Hepatic function panel     ECHOCARDIOGRAM COMPLETE  5. Pulmonary emphysema, unspecified emphysema type ([place])  J43.9        Plan:     Patient Instructions  Interstitial pulmonary disease (HCC) Scl-70 antibody positive [name] phenomenon without gangrene High risk medication use Therapeutic drug monitoring  - ILD itself is stable based on symptoms and walk test  - cotninue  prednisone to 5 mg per day +  myfortic and bactrim at current dose - we again  discussed switch of above regimen to ofev but given fact things are working well currently wil hold off on change  - do cbc, bmet, lft 04/22/2019 - avoid CT scans < 1 year in frequency unless clinically indicated  - last sept 2020 - cbc, bmet, lft sometime this or next week -Avoid family clusters greater than 6 people in size - covid-19 vaccine when available and if no contraindication   Pulmonary emphysema, unspecified emphysema type ([place]) -  Continue spiriva  Followup Cancel feb 2021 spirometry 3 months do spirometry and dlco 3 months do ECHO for eval pulmonary hypertensoion Return to ILD clinic in 3  months     SIGNATURE    Dr. Brand Males, M.D., F.C.C.P,  Pulmonary and Critical Care Medicine Staff Physician, [place] Director - Interstitial Lung Disease  Program  [facility] [place] [facility] [place], [place], [address]  Pager: [phone], If no answer or between  15:00h - 7:00h: call [phone] Telephone: [phone]  11:05 AM 07/04/2019

## 2019-07-04 NOTE — Patient Instructions (Addendum)
Interstitial pulmonary disease (HCC) Scl-70 antibody positive [name] phenomenon without gangrene High risk medication use Therapeutic drug monitoring  - ILD itself is stable based on symptoms and walk test  - cotninue  prednisone to 5 mg per day +  myfortic and bactrim at current dose - we again  discussed switch of above regimen to ofev but given fact things are working well currently wil hold off on change  - do cbc, bmet, lft 04/22/2019 - avoid CT scans < 1 year in frequency unless clinically indicated  - last sept 2020 - cbc, bmet, lft sometime this or next week -Avoid family clusters greater than 6 people in size - covid-19 vaccine when available and if no contraindication   Pulmonary emphysema, unspecified emphysema type ([place]) -  Continue spiriva  Followup Cancel feb 2021 spirometry 3 months do spirometry and dlco 3 months do ECHO for eval pulmonary hypertensoion Return to ILD clinic in 3  months

## 2019-07-10 ENCOUNTER — Telehealth: Payer: Self-pay | Admitting: Internal Medicine

## 2019-07-10 MED ORDER — FLECAINIDE ACETATE 100 MG PO TABS: 100.0000 mg | ORAL_TABLET | Freq: Two times a day (BID) | ORAL | 2 refills | Status: DC

## 2019-07-10 NOTE — Telephone Encounter (Signed)
Pt's medication was sent to pt's pharmacy as requested. Confirmation received.  °

## 2019-07-10 NOTE — Telephone Encounter (Signed)
*  STAT* If patient is at the pharmacy, call can be transferred to refill team.   1. Which medications need to be refilled? (please list name of each medication and dose if known)  Need a prescription sent to her local pharmacy for Flecainide  2. Which pharmacy/location (including street and city if local pharmacy) is medication to be sent to? [facility] at [facility] [address] [facility], [place]  3. Do they need a 30 day or 90 day supply? #30- she have not had any in 6 days- she wants to know if she just take as normal?

## 2019-07-11 ENCOUNTER — Ambulatory Visit (HOSPITAL_COMMUNITY): Payer: Medicare Other | Attending: Cardiology

## 2019-07-11 ENCOUNTER — Other Ambulatory Visit: Payer: Self-pay

## 2019-07-11 DIAGNOSIS — I272 Pulmonary hypertension, unspecified: Secondary | ICD-10-CM | POA: Insufficient documentation

## 2019-07-11 DIAGNOSIS — J849 Interstitial pulmonary disease, unspecified: Secondary | ICD-10-CM | POA: Insufficient documentation

## 2019-07-22 ENCOUNTER — Ambulatory Visit: Payer: Medicare Other | Attending: Internal Medicine

## 2019-07-22 DIAGNOSIS — Z23 Encounter for immunization: Secondary | ICD-10-CM | POA: Insufficient documentation

## 2019-07-22 NOTE — Progress Notes (Signed)
   Covid-19 Vaccination Clinic  Name:  [name]    MRN: [id number] DOB: Oct 28, 1940  07/22/2019  [name] was observed post Covid-19 immunization for 15 minutes without incidence. She was provided with Vaccine Information Sheet and instruction to access the V-Safe system.   [name] was instructed to call 911 with any severe reactions post vaccine: Marland Kitchen Difficulty breathing  . Swelling of your face and throat  . A fast heartbeat  . A bad rash all over your body  . Dizziness and weakness    Immunizations Administered    Name Date Dose VIS Date Route   Pfizer COVID-19 Vaccine 07/22/2019  9:39 AM 0.3 mL 06/07/2019 Intramuscular   Manufacturer: [place]   Lot: [id number]   [place]: [id number]

## 2019-07-24 ENCOUNTER — Other Ambulatory Visit: Payer: Self-pay | Admitting: Internal Medicine

## 2019-07-24 MED ORDER — RIVAROXABAN 20 MG PO TABS: ORAL_TABLET | ORAL | 2 refills | Status: DC

## 2019-07-24 NOTE — Telephone Encounter (Signed)
Pt calling requesting that her medication Xarelto be resent to her local pharmacy [facility]. Please address

## 2019-07-24 NOTE — Telephone Encounter (Signed)
Xarelto 20mg  refill request received. Pt is 79 years old, weight-65kg, Crea-0.72 on 07/04/2019, last seen by Dr. Caryl Comes on 04/05/2019, Diagnosis-Afib, CrCl-77.56ml/min; Dose is appropriate based on dosing criteria. Will send in refill to requested pharmacy.

## 2019-08-12 ENCOUNTER — Ambulatory Visit: Payer: Medicare Other | Attending: Internal Medicine

## 2019-08-12 DIAGNOSIS — Z23 Encounter for immunization: Secondary | ICD-10-CM

## 2019-08-12 NOTE — Progress Notes (Signed)
   Covid-19 Vaccination Clinic  Name:  [name]    MRN: [id number] DOB: [birth year]  08/12/2019  [name] was observed post Covid-19 immunization for 15 minutes without incidence. She was provided with Vaccine Information Sheet and instruction to access the V-Safe system.   [name] was instructed to call 911 with any severe reactions post vaccine: Marland Kitchen Difficulty breathing  . Swelling of your face and throat  . A fast heartbeat  . A bad rash all over your body  . Dizziness and weakness    Immunizations Administered    Name Date Dose VIS Date Route   Pfizer COVID-19 Vaccine 08/12/2019 10:10 AM 0.3 mL 06/07/2019 Intramuscular   Manufacturer: [place]   Lot: [id number]   [place]: [id number]

## 2019-08-13 ENCOUNTER — Ambulatory Visit: Payer: Medicare Other | Admitting: Internal Medicine

## 2019-08-23 NOTE — Progress Notes (Signed)
Echo is normal except she has hear muscle stiffness that can typically make peiople have shortness of breath with exertin. No evidence of elevated pulm art pressure

## 2019-08-28 NOTE — Progress Notes (Signed)
Office Visit Note  Patient: [name]             Date of Birth: 23-Sep-1940           MRN: [id number]             PCP: Kathyrn Lass, MD Referring: Kathyrn Lass, MD Visit Date: 08/29/2019 Occupation: @GUAROCC @  Subjective:  Lower extremity muscle pain   History of Present Illness: [name] is a 79 y.o. female with history of [name], ILD, and osteoarthritis.  She is taking mycophenolate 360 mg 1 tablet twice daily, Bactrim DS 1 tablet 3 times a week, and prednisone 5 mg 1 tablet by mouth daily prescribed by Dr. Chase Caller.  Her last appointment with Dr. Chase Caller was on 07/04/2019 and according to the patient her ILD has been stable.  She denies any increased shortness of breath or coughing recently. She continues to have active symptoms of [name].  She denies any digital ulcerations or signs of gangrene.  She states that she gets frequent symptoms of [name] while in the grocery store.  She denies any skin tightness or skin thickening. She presents today with increased pain in bilateral lower extremities which started 2 weeks ago.  She denies any injuries or changes in activity level prior to the onset of symptoms.  She states she is been going to [facility] urology physical therapy once a week and they have been performing massage as well as stretching exercises.  She denies any lower back pain at this time.  She has not had any hip/groin or knee joint pain.  No joint swelling.  She has not noticed any obvious muscle weakness.  She continues to have trochanter bursitis bilaterally but states that the discomfort seems to be more in her quadricep muscles.  She has tried taking Tylenol and Robaxin which have provided temporary relief.  She would like a refill of Robaxin today.  She is unsure if her discomfort is related to fibromyalgia.    Activities of Daily Living:  Patient reports morning stiffness for a few minutes.   Patient Reports nocturnal pain.  Difficulty  dressing/grooming: Denies Difficulty climbing stairs: Reports Difficulty getting out of chair: Reports Difficulty using hands for taps, buttons, cutlery, and/or writing: Denies  Review of Systems  Constitutional: Negative for fatigue.  HENT: Positive for mouth dryness. Negative for mouth sores and nose dryness.   Eyes: Negative for pain, itching, visual disturbance and dryness.  Respiratory: Negative for cough, hemoptysis, shortness of breath and difficulty breathing.   Cardiovascular: Negative for chest pain, palpitations, hypertension and swelling in legs/feet.  Gastrointestinal: Negative for blood in stool, constipation and diarrhea.  Endocrine: Negative for increased urination.  Genitourinary: Negative for difficulty urinating and painful urination.  Musculoskeletal: Positive for arthralgias, joint pain, muscle weakness and morning stiffness. Negative for joint swelling, myalgias, muscle tenderness and myalgias.  Skin: Negative for color change, pallor, rash, hair loss, nodules/bumps, skin tightness, ulcers and sensitivity to sunlight.  Allergic/Immunologic: Negative for susceptible to infections.  Neurological: Negative for dizziness, numbness, headaches, memory loss and weakness.  Hematological: Negative for swollen glands.  Psychiatric/Behavioral: Negative for depressed mood, confusion and sleep disturbance. The patient is not nervous/anxious.     PMFS History:  Patient Active Problem List   Diagnosis Date Noted  . High risk medication use 02/18/2019  . Therapeutic drug monitoring 02/18/2019  . Healthcare maintenance 02/18/2019  . ILD (interstitial lung disease) ([place]) 11/28/2016  . Multiple lung nodules on CT 11/28/2016  .  Abnormal laboratory test 11/18/2016  . DDD L spine 11/18/2016  . 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 History of hypothyroidism 11/10/2016  . History of hemochromatosis 11/10/2016  . Pulmonary emphysema ([place]) 08/31/2016  . [name] phenomenon without  gangrene 08/31/2016  . Sinusitis, chronic 08/31/2016  . Chronic cough 02/10/2016  . Irritable larynx 02/10/2016  . Stopped smoking with greater than 40 pack year history 02/10/2016  . Chest pain, unspecified 10/06/2010  . Shortness of breath 10/06/2010  . Hypothyroidism   . Hereditary hemochromatosis ([place]) 04/22/2010  . ATRIAL FIBRILLATION 10/20/2008  . [facility] 10/20/2008  . SYNCOPE AND COLLAPSE 10/20/2008    Past Medical History:  Diagnosis Date  . Atrial fibrillation ([place])    a. Flecainide therapy;  b. event monitor 4/12  . Chest pain    a. GXT myoview 4/12: no isch., EF 86%;   b. echo 4/12: EF 55-65%, grade 1 diast dysfxn, LAE  . Depression with anxiety   . GERD (gastroesophageal reflux disease)   . 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 Beryle Beams  . History of syncope   . Hypothyroidism     Family History  Problem Relation Age of Onset  . Stroke Mother   . Heart disease Father 34  . Hemochromatosis Brother    Past Surgical History:  Procedure Laterality Date  . MOUTH SURGERY    . SHOULDER ARTHROSCOPY Left   . TOTAL ABDOMINAL HYSTERECTOMY    . VARICOSE VEIN SURGERY     Social History   Social History Narrative   REGULAR EXERCISE   Immunization History  Administered Date(s) Administered  . Fluad Quad(high Dose 65+) 02/18/2019  . Influenza, High Dose Seasonal PF 08/29/2017, 04/26/2018  . PFIZER SARS-COV-2 Vaccination 07/22/2019, 08/12/2019  . Pneumococcal Conjugate-13 06/12/2014  . Pneumococcal Polysaccharide-23 06/27/2010  . Tdap 03/16/2011  . Zoster 06/27/2010  . Zoster Recombinat (Shingrix) 04/08/2019     Objective: Vital Signs: BP 131/69 (BP Location: Left Arm, Patient Position: Sitting, Cuff Size: Normal)   Pulse (!) 58   Resp 12   Ht 5' 3.5" (1.613 m)   Wt 146 lb 3.2 oz (66.3 kg)   BMI 25.49 kg/m    Physical Exam Vitals and nursing note reviewed.  Constitutional:      Appearance: She is well-developed.  HENT:     Head:  Normocephalic and atraumatic.  Eyes:     Conjunctiva/sclera: Conjunctivae normal.  Pulmonary:     Effort: Pulmonary effort is normal.  Abdominal:     General: Bowel sounds are normal.     Palpations: Abdomen is soft.  Musculoskeletal:     Cervical back: Normal range of motion.  Lymphadenopathy:     Cervical: No cervical adenopathy.  Skin:    General: Skin is warm and dry.     Capillary Refill: Capillary refill takes less than 2 seconds.  Neurological:     Mental Status: She is alert and oriented to person, place, and time.  Psychiatric:        Behavior: Behavior normal.      Musculoskeletal Exam: C-spine limited range of motion with lateral rotation.  Thoracic and lumbar spine have good range of motion with no discomfort.  No midline spinal tenderness.  No SI joint tenderness.  Shoulder joints, elbow joints, wrist joints, MCPs, PIPs, DIPs good range of motion with no synovitis.  She has PIP and DIP synovial thickening consistent with osteoarthritis of both hands.  She has complete fist formation on exam.  Hip  joints have good range of motion with no discomfort.  She has tenderness over bilateral trochanteric bursa and IT bands. Knee joints have good range of motion with no warmth or effusion.  Ankle joints have good range of motion no tenderness or synovitis.  She has full strength of upper and lower extremities on exam.  No difficulty rising from a seated position or raising her arms above her head.  CDAI Exam: CDAI Score: -- Patient Global: --; Provider Global: -- Swollen: --; Tender: -- Joint Exam 08/29/2019   No joint exam has been documented for this visit   There is currently no information documented on the homunculus. Go to the Rheumatology activity and complete the homunculus joint exam.  Investigation: No additional findings.  Imaging: No results found.  Recent Labs: Lab Results  Component Value Date   WBC 6.3 07/04/2019   HGB 12.0 07/04/2019   PLT 215.0  07/04/2019   NA 138 07/04/2019   K 4.1 07/04/2019   CL 104 07/04/2019   CO2 27 07/04/2019   GLUCOSE 100 (H) 07/04/2019   BUN 13 07/04/2019   CREATININE 0.72 07/04/2019   BILITOT 0.5 07/04/2019   ALKPHOS 44 07/04/2019   AST 15 07/04/2019   ALT 11 07/04/2019   PROT 6.4 07/04/2019   ALBUMIN 4.2 07/04/2019   CALCIUM 9.1 07/04/2019   GFRAA >60 09/22/2018   QFTBGOLDPLUS NEGATIVE 08/10/2017    Speciality Comments: No specialty comments available.  Procedures:  No procedures performed Allergies: Patient has no known allergies.   Assessment / Plan:     Visit Diagnoses: [name] phenomenon without gangrene: She has intermittent symptoms of [name].  No digital ulcerations or signs or gangrene were noted.  No signs of sclerodactyly on exam.  We discussed the importance of avoiding triggers.  She was advised to keep her core body temperature warm and to wear gloves and thick socks.  She will notify us if she develops any new or worsening symptoms.  She will follow up in 3 months.   Anticardiolipin antibody positive - Positive anticardiolipin IgM 47, positive beta-2 IgM 31: She is taking Xarelto 20 mg 1 tablet by mouth daily.  ILD (interstitial lung disease) ([place]) - Scl-70+.  She is followed by Dr. Chase Caller.  Her most recent appointment with Dr. Chase Caller was on 07/04/2019, and according to his office visit note her ILD has been stable based on symptoms and walk test.  Advised her to continue taking prednisone 5 mg by mouth daily and mycophenolate and Bactrim as prescribed.    High-resolution chest CT was obtained on 03/20/2019 which revealed very mild basilar prominent subpleural reticulation and groundglass, similar to minimally progressive from 07/03/2017.  Dr. Chase Caller recommends not repeating CT less than 1 year and frequency if symptoms are clinically stable.  She will be returning to the clinic for PFTs in 3 months and will be due for an updated echo to evaluate for pulmonary hypertension at  that time.  High risk medication use -  She is on Myfortic 360 mg twice daily, Bactrim three times a week, and prednisone 5 mg daily prescribed by Dr. Chase Caller.  Multiple lung nodules on CT: Most recent HRCT was on 03/20/19.  History of emphysema: Dr. Chase Caller recommended continuing on Spiriva.   DDD (degenerative disc disease), lumbar: She has good range of motion of the lumbar spine.  No midline spinal tenderness.  She has not had any symptoms of radiculopathy.  She was evaluated by Dr. Ninfa Linden and had x-rays of the  lumbar spine on 01/22/2019.  Her symptoms improved after the visit and she did not proceed with an MRI at that time.  She continues to go to physical therapy once a week.  Primary osteoarthritis of both hands: She has PIP and DIP synovial thickening consistent with osteoarthritis of both hands.  No tenderness or synovitis was noted.  She has complete fist formation bilaterally.  Joint protection and muscle strengthening were discussed.  Trochanteric bursitis of both hips: She has tenderness over bilateral trochanteric bursa.  She has been performing exercises on a daily basis and going to physical therapy once a week which has been improving her symptoms.  She has tenderness along bilateral IT bands.  Myalgia -Bilateral lower extremities: She been experiencing muscle tenderness and muscle aches in bilateral lower extremities especially in the quads and hamstring muscles for the past 2 weeks.  She did not have any injury or change in her activity level prior to the onset of symptoms.  She has no obvious muscle weakness.  She has full strength of upper and lower extremities on exam today.  She has no difficulty rising from a seated position or raising her arms above her head.  She has diffuse muscle tenderness in the quads and hamstrings.  She is also tender over the trochanteric bursa and IT bands bilaterally.  She has tried taking Tylenol and Robaxin which have provided temporary relief.   We will check a CK, aldolase, TSH, sed rate, and vitamin D level today.  A refill of Robaxin was also sent to the pharmacy today.  She is advised to notify us if she develops any new or worsening symptoms.  We will call her with lab results once they have returned.  Plan: methocarbamol (ROBAXIN) 500 MG tablet, CK, Aldolase, TSH, Sedimentation rate, VITAMIN D 25 Hydroxy (Vit-D Deficiency, Fractures)  Muscle tenderness -She presents today with muscle tender in bilateral lower extremities, especially in her quads and hamstrings.  No muscle weakness.  No injury or change in activity.  She has tenderness over bilateral trochanteric bursa and IT bands bilaterally.  She has good ROM of both hip joints and both knee joints with no discomfort.  No warmth or effusion of knee joints.  We will obtain the following lab work.  A refill of Robaxin was sent to the pharmacy.  plan: methocarbamol (ROBAXIN) 500 MG tablet, CK, Aldolase, TSH, Sedimentation rate, VITAMIN D 25 Hydroxy (Vit-D Deficiency, Fractures)  Osteoporosis screening -She is postmenopausal and on long-term prednisone.  She has never had a bone density.  A future order for DEXA was placed today.  We also obtain a vitamin D level.  Plan: DG BONE DENSITY (DXA), VITAMIN D 25 Hydroxy (Vit-D Deficiency, Fractures)  Postmenopausal -According to the patient she has never had a bone density.  She is postmenopausal and on long-term prednisone.  A future order for DEXA was placed today.  Plan: DG BONE DENSITY (DXA), VITAMIN D 25 Hydroxy (Vit-D Deficiency, Fractures)  Long term systemic steroid user -sShe is on chronic prednisone 5 mg by mouth daily.  Future order for DEXA was placed today.  Plan: DG BONE DENSITY (DXA), VITAMIN D 25 Hydroxy (Vit-D Deficiency, Fractures)  Other medical conditions are listed as follows:  Synovial cyst of left popliteal space  History of hemochromatosis  History of hypothyroidism  History of atrial fibrillation  Former  smoker    Orders: Orders Placed This Encounter  Procedures  . DG BONE DENSITY (DXA)  . CK  . Aldolase  .  TSH  . Sedimentation rate  . VITAMIN D 25 Hydroxy (Vit-D Deficiency, Fractures)   Meds ordered this encounter  Medications  . methocarbamol (ROBAXIN) 500 MG tablet    Sig: Take 1 tablet (500 mg total) by mouth daily as needed.    Dispense:  30 tablet    Refill:  0    Face-to-face time spent with patient was 30 minutes. Greater than 50% of time was spent in counseling and coordination of care.  Follow-Up Instructions: Return in about 3 months (around 11/29/2019) for [name] syndrome, ILD.   Ofilia Neas, PA-C   I examined and evaluated the patient with Hazel Sams PA.  Patient has been experiencing some pain in her lower extremity muscles.  Her ILD according to patient has been stable.  We will obtain some labs today.  She had no muscular weakness or tenderness on my examination today.  The plan of care was discussed as noted above.  Bo Merino, MD Note - This record has been created using Editor, commissioning.  Chart creation errors have been sought, but may not always  have been located. Such creation errors do not reflect on  the standard of medical care.

## 2019-08-29 ENCOUNTER — Other Ambulatory Visit: Payer: Self-pay

## 2019-08-29 ENCOUNTER — Encounter: Payer: Self-pay | Admitting: Rheumatology

## 2019-08-29 ENCOUNTER — Ambulatory Visit (INDEPENDENT_AMBULATORY_CARE_PROVIDER_SITE_OTHER): Payer: Medicare Other | Admitting: Rheumatology

## 2019-08-29 VITALS — BP 131/69 | HR 58 | Resp 12 | Ht 63.5 in | Wt 146.2 lb

## 2019-08-29 DIAGNOSIS — Z7952 Long term (current) use of systemic steroids: Secondary | ICD-10-CM

## 2019-08-29 DIAGNOSIS — Z8679 Personal history of other diseases of the circulatory system: Secondary | ICD-10-CM

## 2019-08-29 DIAGNOSIS — J849 Interstitial pulmonary disease, unspecified: Secondary | ICD-10-CM

## 2019-08-29 DIAGNOSIS — R918 Other nonspecific abnormal finding of lung field: Secondary | ICD-10-CM

## 2019-08-29 DIAGNOSIS — R760 Raised antibody titer: Secondary | ICD-10-CM

## 2019-08-29 DIAGNOSIS — M19042 Primary osteoarthritis, left hand: Secondary | ICD-10-CM

## 2019-08-29 DIAGNOSIS — M7061 Trochanteric bursitis, right hip: Secondary | ICD-10-CM

## 2019-08-29 DIAGNOSIS — Z79899 Other long term (current) drug therapy: Secondary | ICD-10-CM | POA: Diagnosis not present

## 2019-08-29 DIAGNOSIS — M5136 Other intervertebral disc degeneration, lumbar region: Secondary | ICD-10-CM

## 2019-08-29 DIAGNOSIS — M7122 Synovial cyst of popliteal space [Baker], left knee: Secondary | ICD-10-CM

## 2019-08-29 DIAGNOSIS — M19041 Primary osteoarthritis, right hand: Secondary | ICD-10-CM

## 2019-08-29 DIAGNOSIS — Z13820 Encounter for screening for osteoporosis: Secondary | ICD-10-CM

## 2019-08-29 DIAGNOSIS — M7062 Trochanteric bursitis, left hip: Secondary | ICD-10-CM

## 2019-08-29 DIAGNOSIS — I7300 Raynaud's syndrome without gangrene: Secondary | ICD-10-CM | POA: Diagnosis not present

## 2019-08-29 DIAGNOSIS — Z8639 Personal history of other endocrine, nutritional and metabolic disease: Secondary | ICD-10-CM

## 2019-08-29 DIAGNOSIS — Z780 Asymptomatic menopausal state: Secondary | ICD-10-CM

## 2019-08-29 DIAGNOSIS — Z8709 Personal history of other diseases of the respiratory system: Secondary | ICD-10-CM

## 2019-08-29 DIAGNOSIS — Z87891 Personal history of nicotine dependence: Secondary | ICD-10-CM

## 2019-08-29 DIAGNOSIS — M791 Myalgia, unspecified site: Secondary | ICD-10-CM

## 2019-08-29 MED ORDER — METHOCARBAMOL 500 MG PO TABS: 500.0000 mg | ORAL_TABLET | Freq: Every day | ORAL | 0 refills | Status: DC | PRN

## 2019-08-30 ENCOUNTER — Telehealth: Payer: Self-pay | Admitting: *Deleted

## 2019-08-30 DIAGNOSIS — E559 Vitamin D deficiency, unspecified: Secondary | ICD-10-CM

## 2019-08-30 LAB — ALDOLASE: Aldolase: 3 U/L (ref <=8.1)

## 2019-08-30 LAB — VITAMIN D 25 HYDROXY (VIT D DEFICIENCY, FRACTURES): Vit D, 25-Hydroxy: 23 ng/mL — ABNORMAL LOW (ref 30–100)

## 2019-08-30 LAB — TSH: TSH: 0.31 mIU/L — ABNORMAL LOW (ref 0.40–4.50)

## 2019-08-30 LAB — SEDIMENTATION RATE: Sed Rate: 6 mm/h (ref 0–30)

## 2019-08-30 LAB — CK: Total CK: 50 U/L (ref 29–143)

## 2019-08-30 MED ORDER — VITAMIN D (ERGOCALCIFEROL) 1.25 MG (50000 UNIT) PO CAPS: 50000.0000 [IU] | ORAL_CAPSULE | ORAL | 0 refills | Status: DC

## 2019-08-30 NOTE — Progress Notes (Signed)
Vitamin D is low-23.  Please notify patient and send in vitamin D 50,000 units by mouth once weekly for 3 months.  We will recheck vitamin D in 3 months.  Her low vitamin D could be contributing to her symptoms.   ESR WNL.  CK WNL.   TSH is low.  Please notify patient and forward to PCP to further discuss.

## 2019-08-30 NOTE — Telephone Encounter (Signed)
-----  Message from Ofilia Neas, PA-C sent at 08/30/2019  7:54 AM EST ----- Vitamin D is low-23.  Please notify patient and send in vitamin D 50,000 units by mouth once weekly for 3 months.  We will recheck vitamin D in 3 months.  Her low vitamin D could be contributing to her symptoms.   ESR WNL.  CK WNL.   TSH is low.  Please notify patient and forward to PCP to further discuss.

## 2019-08-30 NOTE — Addendum Note (Signed)
Addended by: Carole Binning on: 08/30/2019 03:22 PM   Modules accepted: Orders

## 2019-09-02 NOTE — Progress Notes (Signed)
Aldolase WNL. Please ask if the patients symptoms have started to improve.

## 2019-09-04 ENCOUNTER — Emergency Department (HOSPITAL_COMMUNITY): Payer: Medicare Other

## 2019-09-04 ENCOUNTER — Emergency Department (HOSPITAL_COMMUNITY)
Admission: EM | Admit: 2019-09-04 | Discharge: 2019-09-04 | Disposition: A | Discharge status: Home / Self Care | Payer: Medicare Other | Attending: Emergency Medicine | Admitting: Emergency Medicine

## 2019-09-04 ENCOUNTER — Encounter (HOSPITAL_COMMUNITY): Payer: Self-pay | Admitting: Emergency Medicine

## 2019-09-04 ENCOUNTER — Other Ambulatory Visit: Payer: Self-pay

## 2019-09-04 DIAGNOSIS — S6991XA Unspecified injury of right wrist, hand and finger(s), initial encounter: Secondary | ICD-10-CM | POA: Insufficient documentation

## 2019-09-04 DIAGNOSIS — Y999 Unspecified external cause status: Secondary | ICD-10-CM | POA: Diagnosis not present

## 2019-09-04 DIAGNOSIS — S99922A Unspecified injury of left foot, initial encounter: Secondary | ICD-10-CM | POA: Diagnosis not present

## 2019-09-04 DIAGNOSIS — S0990XA Unspecified injury of head, initial encounter: Secondary | ICD-10-CM | POA: Diagnosis not present

## 2019-09-04 DIAGNOSIS — S299XXA Unspecified injury of thorax, initial encounter: Secondary | ICD-10-CM | POA: Insufficient documentation

## 2019-09-04 DIAGNOSIS — M79632 Pain in left forearm: Secondary | ICD-10-CM | POA: Insufficient documentation

## 2019-09-04 DIAGNOSIS — M79644 Pain in right finger(s): Secondary | ICD-10-CM | POA: Insufficient documentation

## 2019-09-04 DIAGNOSIS — S59912A Unspecified injury of left forearm, initial encounter: Secondary | ICD-10-CM | POA: Insufficient documentation

## 2019-09-04 DIAGNOSIS — Z7901 Long term (current) use of anticoagulants: Secondary | ICD-10-CM | POA: Insufficient documentation

## 2019-09-04 DIAGNOSIS — S52135A Nondisplaced fracture of neck of left radius, initial encounter for closed fracture: Secondary | ICD-10-CM

## 2019-09-04 DIAGNOSIS — M79672 Pain in left foot: Secondary | ICD-10-CM | POA: Diagnosis not present

## 2019-09-04 DIAGNOSIS — S59902A Unspecified injury of left elbow, initial encounter: Secondary | ICD-10-CM | POA: Diagnosis present

## 2019-09-04 DIAGNOSIS — I4891 Unspecified atrial fibrillation: Secondary | ICD-10-CM | POA: Diagnosis not present

## 2019-09-04 DIAGNOSIS — Y929 Unspecified place or not applicable: Secondary | ICD-10-CM | POA: Insufficient documentation

## 2019-09-04 DIAGNOSIS — Y9389 Activity, other specified: Secondary | ICD-10-CM | POA: Insufficient documentation

## 2019-09-04 DIAGNOSIS — R0789 Other chest pain: Secondary | ICD-10-CM | POA: Diagnosis not present

## 2019-09-04 DIAGNOSIS — W108XXA Fall (on) (from) other stairs and steps, initial encounter: Secondary | ICD-10-CM | POA: Diagnosis not present

## 2019-09-04 DIAGNOSIS — S52182A Other fracture of upper end of left radius, initial encounter for closed fracture: Secondary | ICD-10-CM | POA: Insufficient documentation

## 2019-09-04 DIAGNOSIS — S01512A Laceration without foreign body of oral cavity, initial encounter: Secondary | ICD-10-CM | POA: Diagnosis not present

## 2019-09-04 DIAGNOSIS — I7300 Raynaud's syndrome without gangrene: Secondary | ICD-10-CM | POA: Diagnosis not present

## 2019-09-04 MED ORDER — OXYCODONE-ACETAMINOPHEN 5-325 MG PO TABS
1.0000 | ORAL_TABLET | Freq: Once | ORAL | Status: AC
  Administered 2019-09-04: 1 via ORAL
  Filled 2019-09-04: qty 1

## 2019-09-04 MED ORDER — OXYCODONE-ACETAMINOPHEN 5-325 MG PO TABS: 1.0000 | ORAL_TABLET | Freq: Four times a day (QID) | ORAL | 0 refills | Status: AC | PRN

## 2019-09-04 NOTE — ED Notes (Signed)
Pt transported to Radiology 

## 2019-09-04 NOTE — ED Triage Notes (Signed)
Pt reports was going in for an appt at [facility] and missed the step up and fell landing on her left side. Reports hit her head, denies LOC but takes Xerlato. Has lace to left upper lip, c/o pains at left elbow.

## 2019-09-04 NOTE — ED Provider Notes (Signed)
[facility] DEPT Provider Note   CSN: [id number] Arrival date & time: 09/04/19  [id number]     History Chief Complaint  Patient presents with  . Fall  . Arm Injury  . Head Injury    [name] is a 79 y.o. female.  79 year old female with past medical history below including atrial fibrillation on Xarelto, interstitial lung disease who presents with fall.  Just prior to arrival, the patient was going to an appointment at [facility] when she tripped on a step and fell landing on her left side.  She bumped the left side of her face and head but did not lose consciousness.  She reports pain on her left face, her left lip, left forearm and elbow, right thumb, and left foot.  Pain is most severe in her left arm.  She is right-handed.  No hip, back, or abdominal pain.  She has noticed some central chest soreness but denies any breathing problems. No N/V.  The history is provided by the patient.  Fall  Arm Injury Head Injury      Past Medical History:  Diagnosis Date  . Atrial fibrillation ([place])    a. Flecainide therapy;  b. event monitor 4/12  . Chest pain    a. GXT myoview 4/12: no isch., EF 86%;   b. echo 4/12: EF 55-65%, grade 1 diast dysfxn, LAE  . Depression with anxiety   . GERD (gastroesophageal reflux disease)   . 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 Beryle Beams  . History of syncope   . Hypothyroidism     Patient Active Problem List   Diagnosis Date Noted  . High risk medication use 02/18/2019  . Therapeutic drug monitoring 02/18/2019  . Healthcare maintenance 02/18/2019  . ILD (interstitial lung disease) ([place]) 11/28/2016  . Multiple lung nodules on CT 11/28/2016  . Abnormal laboratory test 11/18/2016  . DDD L spine 11/18/2016  . 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 History of hypothyroidism 11/10/2016  . History of hemochromatosis 11/10/2016  . Pulmonary emphysema ([place]) 08/31/2016  . [name]  phenomenon without gangrene 08/31/2016  . Sinusitis, chronic 08/31/2016  . Chronic cough 02/10/2016  . Irritable larynx 02/10/2016  . Stopped smoking with greater than 40 pack year history 02/10/2016  . Chest pain, unspecified 10/06/2010  . Shortness of breath 10/06/2010  . Hypothyroidism   . Hereditary hemochromatosis ([place]) 04/22/2010  . ATRIAL FIBRILLATION 10/20/2008  . [facility] 10/20/2008  . SYNCOPE AND COLLAPSE 10/20/2008    Past Surgical History:  Procedure Laterality Date  . MOUTH SURGERY    . SHOULDER ARTHROSCOPY Left   . TOTAL ABDOMINAL HYSTERECTOMY    . VARICOSE VEIN SURGERY       OB History   No obstetric history on file.     Family History  Problem Relation Age of Onset  . Stroke Mother   . Heart disease Father 40  . Hemochromatosis Brother     Social History   Tobacco Use  . Smoking status: Former Smoker    Packs/day: 1.00    Years: 42.00    Pack years: 42.00    Quit date: 06/27/1997    Years since quitting: 22.2  . Smokeless tobacco: Never Used  Substance Use Topics  . Alcohol use: Yes    Comment: occ  . Drug use: No    Home Medications Prior to Admission medications   Medication Sig Start Date End Date Taking? Authorizing Provider  acetaminophen (TYLENOL) 325 MG  tablet Take 650 mg by mouth every 6 (six) hours as needed for mild pain or headache.   Yes [provider]  atorvastatin (LIPITOR) 10 MG tablet Take 10 mg by mouth daily. 06/18/19  Yes [provider]  Cyanocobalamin (VITAMIN B 12 PO) Take 1,000 mcg by mouth daily.   Yes [provider]  flecainide (TAMBOCOR) 100 MG tablet Take 1 tablet (100 mg total) by mouth 2 (two) times daily. 07/10/19  Yes Deboraha Sprang, MD  folic acid (FOLVITE) 1 MG tablet Take 1 mg by mouth daily.     Yes [provider]  levothyroxine (SYNTHROID, LEVOTHROID) 112 MCG tablet Take 112 mcg by mouth daily.     Yes [provider]  methocarbamol (ROBAXIN) 500 MG tablet Take 1  tablet (500 mg total) by mouth daily as needed. 08/29/19  Yes Ofilia Neas, PA-C  metoprolol succinate (TOPROL-XL) 25 MG 24 hr tablet TAKE 1 TABLET BY MOUTH  DAILY Patient taking differently: Take 25 mg by mouth daily.  05/29/17  Yes Deboraha Sprang, MD  mycophenolate (MYFORTIC) 360 MG TBEC EC tablet TAKE 1 TABLET BY MOUTH TWICE DAILY Patient taking differently: Take 360 mg by mouth 2 (two) times daily.  07/04/19  Yes Brand Males, MD  omeprazole (PRILOSEC) 40 MG capsule Take 40 mg by mouth daily.   Yes [provider]  predniSONE (DELTASONE) 5 MG tablet TAKE 1 TABLET(5 MG) BY MOUTH DAILY WITH BREAKFAST Patient taking differently: Take 5 mg by mouth daily with breakfast.  04/01/19  Yes Brand Males, MD  rivaroxaban (XARELTO) 20 MG TABS tablet TAKE 1 TABLET BY MOUTH ONCE DAILY WITH FOOD Patient taking differently: Take 20 mg by mouth daily. TAKE 1 TABLET BY MOUTH ONCE DAILY WITH FOOD 07/24/19  Yes Deboraha Sprang, MD  SPIRIVA RESPIMAT 2.5 MCG/ACT AERS INHALE 2 PUFFS INTO THE LUNGS DAILY Patient taking differently: Inhale 2 puffs into the lungs daily.  06/24/19  Yes Brand Males, MD  sulfamethoxazole-trimethoprim (BACTRIM DS) 800-160 MG tablet Take 1 tablet by mouth 3 (three) times a week. Takes on M/W/F   Yes [provider]  Vitamin D, Ergocalciferol, (DRISDOL) 1.25 MG (50000 UNIT) CAPS capsule Take 1 capsule (50,000 Units total) by mouth every 7 (seven) days. 08/30/19  Yes Deveshwar, Abel Presto, MD  ondansetron (ZOFRAN ODT) 4 MG disintegrating tablet Take 1-2 tablets three times daily as needed Patient not taking: Reported on 09/04/2019 09/22/17   Brand Males, MD  oxyCODONE-acetaminophen (PERCOCET) 5-325 MG tablet Take 1 tablet by mouth every 6 (six) hours as needed for up to 5 days. 09/04/19 09/09/19  Dilan Novosad, Wenda Overland, MD    Allergies    Patient has no known allergies.  Review of Systems   Review of Systems All other systems reviewed and are negative except that  which was mentioned in HPI  Physical Exam Updated Vital Signs BP (!) 132/94   Pulse 68   Temp 98 F (36.7 C) (Oral)   Resp 18   SpO2 96%   Physical Exam Vitals and nursing note reviewed.  Constitutional:      General: She is not in acute distress.    Appearance: She is well-developed.     Comments: uncomfortable  HENT:     Head: Normocephalic.     Comments: Swelling of L side of face including cheek; small 0.62mm laceration on inside of L lower lip only involving mucosal surface and not crossing vermillion border    Mouth/Throat:  Mouth: Mucous membranes are moist.     Pharynx: Oropharynx is clear.  Eyes:     Conjunctiva/sclera: Conjunctivae normal.     Pupils: Pupils are equal, round, and reactive to light.  Cardiovascular:     Rate and Rhythm: Normal rate and regular rhythm.     Heart sounds: Normal heart sounds. No murmur.  Pulmonary:     Effort: Pulmonary effort is normal.     Breath sounds: Normal breath sounds.  Chest:     Chest wall: Tenderness (mild central; no crepitus) present.  Abdominal:     General: Bowel sounds are normal. There is no distension.     Palpations: Abdomen is soft.     Tenderness: There is no abdominal tenderness.  Musculoskeletal:     Cervical back: Normal range of motion and neck supple. No tenderness.     Comments: Normal ROM b/l shoulders, hips, knees, ankles; tenderness and swelling proximal L forearm over radial head, small elbow joint effusion, no pain at wrist, normal sensation and cap refill of fingers; 2+ radial pulses; mild tenderness top of dorsal L foot with no midfoot instability, ankle tenderness or tenderness at base of 5th metatarsal; bruising of R thenar eminence  Skin:    General: Skin is warm and dry.  Neurological:     Mental Status: She is alert and oriented to person, place, and time.     Comments: Fluent speech  Psychiatric:        Judgment: Judgment normal.     ED Results / Procedures / Treatments   Labs (all  labs ordered are listed, but only abnormal results are displayed) Labs Reviewed - No data to display  EKG None  Radiology DG Chest 2 View  Result Date: 09/04/2019 CLINICAL DATA:  Chest pain after fall. EXAM: CHEST - 2 VIEW COMPARISON:  September 22, 2018. FINDINGS: The heart size and mediastinal contours are within normal limits. Both lungs are clear. No pneumothorax or pleural effusion is noted. Atherosclerosis of thoracic aorta is noted. The visualized skeletal structures are unremarkable. IMPRESSION: No active cardiopulmonary disease. Aortic Atherosclerosis (ICD10-I70.0). Electronically Signed   By: Marijo Conception M.D.   On: 09/04/2019 10:48   DG Elbow Complete Left  Result Date: 09/04/2019 CLINICAL DATA:  Fall.  Left arm pain EXAM: LEFT ELBOW - COMPLETE 3+ VIEW COMPARISON:  None. FINDINGS: There is a left radial neck fracture, nondisplaced. Associated joint effusion. No subluxation or dislocation. IMPRESSION: Nondisplaced left radial neck fracture. Electronically Signed   By: Rolm Baptise M.D.   On: 09/04/2019 10:02   DG Forearm Left  Result Date: 09/04/2019 CLINICAL DATA:  Left forearm injury. EXAM: LEFT FOREARM - 2 VIEW COMPARISON:  None. FINDINGS: Nondisplaced fracture is seen involving the radial neck. The ulna is unremarkable. No soft tissue abnormality is noted. IMPRESSION: Nondisplaced radial neck fracture. Electronically Signed   By: Marijo Conception M.D.   On: 09/04/2019 10:46   CT Head Wo Contrast  Result Date: 09/04/2019 CLINICAL DATA:  Fall with head trauma.  Face injury. EXAM: CT HEAD WITHOUT CONTRAST CT MAXILLOFACIAL WITHOUT CONTRAST CT CERVICAL SPINE WITHOUT CONTRAST TECHNIQUE: Multidetector CT imaging of the head, cervical spine, and maxillofacial structures were performed using the standard protocol without intravenous contrast. Multiplanar CT image reconstructions of the cervical spine and maxillofacial structures were also generated. COMPARISON:  None. FINDINGS: CT HEAD  FINDINGS Brain: No evidence of acute infarction, hemorrhage, hydrocephalus, extra-axial collection or mass lesion/mass effect. Patchy chronic small vessel ischemia in the  cerebral white matter. Vascular: Atherosclerotic calcification. Skull: Normal. Negative for fracture or focal lesion. CT MAXILLOFACIAL FINDINGS Osseous: No evidence of fracture or bone lesion. Orbits: Negative. No traumatic or inflammatory finding. Sinuses: Negative for hemosinus Soft tissues: Contusion to the left face.  No opaque foreign body. CT CERVICAL SPINE FINDINGS Alignment: Slight C6-7 anterolisthesis. Skull base and vertebrae: No acute fracture. No evidence of erosion or bone lesion Soft tissues and spinal canal: No prevertebral fluid or swelling. No visible canal hematoma. Disc levels: Prominent generalized degenerative facet spurring asymmetric to the left. Upper chest: Probable mild emphysema. Interstitial coarsening which may be atelectasis or scarring. No pneumothorax. IMPRESSION: 1. No evidence of acute intracranial or cervical spine injury. 2. Left face contusion without fracture. Electronically Signed   By: Monte Fantasia M.D.   On: 09/04/2019 11:41   CT Cervical Spine Wo Contrast  Result Date: 09/04/2019 CLINICAL DATA:  Fall with head trauma.  Face injury. EXAM: CT HEAD WITHOUT CONTRAST CT MAXILLOFACIAL WITHOUT CONTRAST CT CERVICAL SPINE WITHOUT CONTRAST TECHNIQUE: Multidetector CT imaging of the head, cervical spine, and maxillofacial structures were performed using the standard protocol without intravenous contrast. Multiplanar CT image reconstructions of the cervical spine and maxillofacial structures were also generated. COMPARISON:  None. FINDINGS: CT HEAD FINDINGS Brain: No evidence of acute infarction, hemorrhage, hydrocephalus, extra-axial collection or mass lesion/mass effect. Patchy chronic small vessel ischemia in the cerebral white matter. Vascular: Atherosclerotic calcification. Skull: Normal. Negative for  fracture or focal lesion. CT MAXILLOFACIAL FINDINGS Osseous: No evidence of fracture or bone lesion. Orbits: Negative. No traumatic or inflammatory finding. Sinuses: Negative for hemosinus Soft tissues: Contusion to the left face.  No opaque foreign body. CT CERVICAL SPINE FINDINGS Alignment: Slight C6-7 anterolisthesis. Skull base and vertebrae: No acute fracture. No evidence of erosion or bone lesion Soft tissues and spinal canal: No prevertebral fluid or swelling. No visible canal hematoma. Disc levels: Prominent generalized degenerative facet spurring asymmetric to the left. Upper chest: Probable mild emphysema. Interstitial coarsening which may be atelectasis or scarring. No pneumothorax. IMPRESSION: 1. No evidence of acute intracranial or cervical spine injury. 2. Left face contusion without fracture. Electronically Signed   By: Monte Fantasia M.D.   On: 09/04/2019 11:41   DG Finger Thumb Right  Result Date: 09/04/2019 CLINICAL DATA:  Right thumb pain after fall. EXAM: RIGHT THUMB 2+V COMPARISON:  None. FINDINGS: There is no evidence of fracture or dislocation. There is no evidence of arthropathy or other focal bone abnormality. Soft tissues are unremarkable. IMPRESSION: Negative. Electronically Signed   By: Marijo Conception M.D.   On: 09/04/2019 10:51   DG Foot Complete Left  Result Date: 09/04/2019 CLINICAL DATA:  Left foot pain after fall. EXAM: LEFT FOOT - COMPLETE 3+ VIEW COMPARISON:  None. FINDINGS: There is no evidence of fracture or dislocation. There is no evidence of arthropathy. There appears to be chronic resorption of the distal portions of the fourth and fifth proximal phalanges which may be due to degenerative change. Soft tissues are unremarkable. IMPRESSION: No acute abnormality is noted. Electronically Signed   By: Marijo Conception M.D.   On: 09/04/2019 10:50   CT Maxillofacial WO CM  Result Date: 09/04/2019 CLINICAL DATA:  Fall with head trauma.  Face injury. EXAM: CT HEAD WITHOUT  CONTRAST CT MAXILLOFACIAL WITHOUT CONTRAST CT CERVICAL SPINE WITHOUT CONTRAST TECHNIQUE: Multidetector CT imaging of the head, cervical spine, and maxillofacial structures were performed using the standard protocol without intravenous contrast. Multiplanar CT image reconstructions of  the cervical spine and maxillofacial structures were also generated. COMPARISON:  None. FINDINGS: CT HEAD FINDINGS Brain: No evidence of acute infarction, hemorrhage, hydrocephalus, extra-axial collection or mass lesion/mass effect. Patchy chronic small vessel ischemia in the cerebral white matter. Vascular: Atherosclerotic calcification. Skull: Normal. Negative for fracture or focal lesion. CT MAXILLOFACIAL FINDINGS Osseous: No evidence of fracture or bone lesion. Orbits: Negative. No traumatic or inflammatory finding. Sinuses: Negative for hemosinus Soft tissues: Contusion to the left face.  No opaque foreign body. CT CERVICAL SPINE FINDINGS Alignment: Slight C6-7 anterolisthesis. Skull base and vertebrae: No acute fracture. No evidence of erosion or bone lesion Soft tissues and spinal canal: No prevertebral fluid or swelling. No visible canal hematoma. Disc levels: Prominent generalized degenerative facet spurring asymmetric to the left. Upper chest: Probable mild emphysema. Interstitial coarsening which may be atelectasis or scarring. No pneumothorax. IMPRESSION: 1. No evidence of acute intracranial or cervical spine injury. 2. Left face contusion without fracture. Electronically Signed   By: Monte Fantasia M.D.   On: 09/04/2019 11:41    Procedures Procedures (including critical care time)  Medications Ordered in ED Medications  oxyCODONE-acetaminophen (PERCOCET/ROXICET) 5-325 MG per tablet 1 tablet (1 tablet Oral Given 09/04/19 1046)    ED Course  I have reviewed the triage vital signs and the nursing notes.  Pertinent imaging results that were available during my care of the patient were reviewed by me and considered  in my medical decision making (see chart for details).    MDM Rules/Calculators/A&P                      Patient was alert, neurovascularly intact on exam with reassuring vital signs.  Injuries as above, main complaint was left elbow pain.  Obtain CT of head, neck, and face because of Xarelto use, CTs negative acute.  Plain films of extremities show nondisplaced radial neck fracture, otherwise negative.  Placed patient in sling and recommended orthopedics follow-up, discussed supportive measures for orthopedic injury.  Mouth laceration does not require repair, discussed supportive measures.  I have extensively reviewed return precautions especially regarding her head injury.  She voiced understanding. Final Clinical Impression(s) / ED Diagnoses Final diagnoses:  Closed nondisplaced fracture of neck of left radius, initial encounter  Laceration of internal mouth, initial encounter  Injury of head, initial encounter    Rx / DC Orders ED Discharge Orders         Ordered    oxyCODONE-acetaminophen (PERCOCET) 5-325 MG tablet  Every 6 hours PRN     09/04/19 1224           Zollie Ellery, Wenda Overland, MD 09/04/19 1227

## 2019-09-09 ENCOUNTER — Other Ambulatory Visit: Payer: Self-pay | Admitting: Orthopedic Surgery

## 2019-09-09 ENCOUNTER — Ambulatory Visit
Admission: RE | Admit: 2019-09-09 | Discharge: 2019-09-09 | Disposition: A | Discharge status: Home / Self Care | Payer: Medicare Other | Source: Ambulatory Visit | Attending: Orthopedic Surgery | Admitting: Orthopedic Surgery

## 2019-09-09 ENCOUNTER — Telehealth: Payer: Self-pay | Admitting: Internal Medicine

## 2019-09-09 ENCOUNTER — Other Ambulatory Visit: Payer: Self-pay

## 2019-09-09 DIAGNOSIS — R071 Chest pain on breathing: Secondary | ICD-10-CM

## 2019-09-09 NOTE — Telephone Encounter (Signed)
ATC home number, line went silent after one ring. No VM option.  Lmtcb on mobile number.

## 2019-09-10 NOTE — Telephone Encounter (Signed)
Called and spoke with Patient. Patient stated her prescription for ondansetron expired, and she is needing a new prescription sent to [facility] and [facility].  Patient last refill was for ondansetron 4mg , take 1-2 tabs TID needed, #42, with 1 refill, 09/22/17.  Message routed to Dr. Chase Caller to advise

## 2019-09-11 MED ORDER — ONDANSETRON 4 MG PO TBDP: ORAL_TABLET | ORAL | 1 refills | Status: DC

## 2019-09-11 NOTE — Telephone Encounter (Signed)
That is fine 

## 2019-09-11 NOTE — Telephone Encounter (Signed)
ATC Patient.  Left detailed message (DPR) on VM to let Patient know requested prescription was sent to pharmacy. Nothing further at this time.

## 2019-09-15 ENCOUNTER — Other Ambulatory Visit: Payer: Self-pay | Admitting: Internal Medicine

## 2019-10-15 ENCOUNTER — Other Ambulatory Visit: Payer: Self-pay | Admitting: Internal Medicine

## 2019-10-19 ENCOUNTER — Other Ambulatory Visit (HOSPITAL_COMMUNITY)
Admission: RE | Admit: 2019-10-19 | Discharge: 2019-10-19 | Disposition: A | Discharge status: Home / Self Care | Payer: Medicare Other | Source: Ambulatory Visit | Attending: Internal Medicine | Admitting: Internal Medicine

## 2019-10-19 DIAGNOSIS — Z01812 Encounter for preprocedural laboratory examination: Secondary | ICD-10-CM | POA: Insufficient documentation

## 2019-10-19 DIAGNOSIS — Z20822 Contact with and (suspected) exposure to covid-19: Secondary | ICD-10-CM | POA: Insufficient documentation

## 2019-10-19 LAB — SARS CORONAVIRUS 2 (TAT 6-24 HRS): SARS Coronavirus 2: NEGATIVE

## 2019-10-22 ENCOUNTER — Ambulatory Visit (INDEPENDENT_AMBULATORY_CARE_PROVIDER_SITE_OTHER): Payer: Medicare Other | Admitting: Internal Medicine

## 2019-10-22 ENCOUNTER — Encounter: Payer: Self-pay | Admitting: Internal Medicine

## 2019-10-22 ENCOUNTER — Other Ambulatory Visit: Payer: Self-pay

## 2019-10-22 ENCOUNTER — Other Ambulatory Visit (INDEPENDENT_AMBULATORY_CARE_PROVIDER_SITE_OTHER): Payer: Medicare Other

## 2019-10-22 VITALS — BP 112/70 | HR 67 | Temp 97.3°F | Ht 63.75 in | Wt 145.0 lb

## 2019-10-22 DIAGNOSIS — Z5181 Encounter for therapeutic drug level monitoring: Secondary | ICD-10-CM | POA: Diagnosis not present

## 2019-10-22 DIAGNOSIS — Z79899 Other long term (current) drug therapy: Secondary | ICD-10-CM

## 2019-10-22 DIAGNOSIS — J849 Interstitial pulmonary disease, unspecified: Secondary | ICD-10-CM | POA: Diagnosis not present

## 2019-10-22 LAB — PULMONARY FUNCTION TEST
DL/VA % pred: 73 %
DL/VA: 3.02 ml/min/mmHg/L
DLCO cor % pred: 66 %
DLCO cor: 12.46 ml/min/mmHg
DLCO unc % pred: 66 %
DLCO unc: 12.46 ml/min/mmHg
FEF 25-75 Pre: 1.12 L/sec
FEF2575-%Pred-Pre: 74 %
FEV1-%Pred-Pre: 98 %
FEV1-Pre: 1.96 L
FEV1FVC-%Pred-Pre: 93 %
FEV6-%Pred-Pre: 111 %
FEV6-Pre: 2.82 L
FEV6FVC-%Pred-Pre: 104 %
FVC-%Pred-Pre: 106 %
FVC-Pre: 2.83 L
Pre FEV1/FVC ratio: 69 %
Pre FEV6/FVC Ratio: 99 %

## 2019-10-22 LAB — CBC WITH DIFFERENTIAL/PLATELET
Basophils Absolute: 0.1 10*3/uL (ref 0.0–0.1)
Basophils Relative: 1 % (ref 0.0–3.0)
Eosinophils Absolute: 0 10*3/uL (ref 0.0–0.7)
Eosinophils Relative: 0.6 % (ref 0.0–5.0)
HCT: 35.4 % — ABNORMAL LOW (ref 36.0–46.0)
Hemoglobin: 11.8 g/dL — ABNORMAL LOW (ref 12.0–15.0)
Lymphocytes Relative: 15.6 % (ref 12.0–46.0)
Lymphs Abs: 1 10*3/uL (ref 0.7–4.0)
MCHC: 33.3 g/dL (ref 30.0–36.0)
MCV: 102.5 fl — ABNORMAL HIGH (ref 78.0–100.0)
Monocytes Absolute: 0.4 10*3/uL (ref 0.1–1.0)
Monocytes Relative: 5.9 % (ref 3.0–12.0)
Neutro Abs: 4.7 10*3/uL (ref 1.4–7.7)
Neutrophils Relative %: 76.9 % (ref 43.0–77.0)
Platelets: 244 10*3/uL (ref 150.0–400.0)
RBC: 3.45 Mil/uL — ABNORMAL LOW (ref 3.87–5.11)
RDW: 14.5 % (ref 11.5–15.5)
WBC: 6.1 10*3/uL (ref 4.0–10.5)

## 2019-10-22 LAB — HEPATIC FUNCTION PANEL
ALT: 10 U/L (ref 0–35)
AST: 13 U/L (ref 0–37)
Albumin: 4.2 g/dL (ref 3.5–5.2)
Alkaline Phosphatase: 60 U/L (ref 39–117)
Bilirubin, Direct: 0.1 mg/dL (ref 0.0–0.3)
Total Bilirubin: 0.5 mg/dL (ref 0.2–1.2)
Total Protein: 6.6 g/dL (ref 6.0–8.3)

## 2019-10-22 LAB — BASIC METABOLIC PANEL
BUN: 13 mg/dL (ref 6–23)
CO2: 29 mEq/L (ref 19–32)
Calcium: 8.9 mg/dL (ref 8.4–10.5)
Chloride: 103 mEq/L (ref 96–112)
Creatinine, Ser: 0.74 mg/dL (ref 0.40–1.20)
GFR: 75.85 mL/min (ref >60.00)
Glucose, Bld: 91 mg/dL (ref 70–99)
Potassium: 4.2 mEq/L (ref 3.5–5.1)
Sodium: 138 mEq/L (ref 135–145)

## 2019-10-22 NOTE — Progress Notes (Signed)
PFT done today. 

## 2019-10-22 NOTE — Progress Notes (Signed)
PCP Tara C, PA-[name]   HPI  IOV 02/10/2016  Chief Complaint  Patient presents with  . Pulmonary Consult    Pt referred by Dr. Melford Aase for chronic cough x 1 year. Pt states she feels she has a tickle that is causing her dry cough. Pt states she has DOE when climbing stairs. Pt deneis CP/tightness.     79 year old female with hemochromatosis homozygous gene followed by Dr. Beryle Beams (last phlebotomy many years ago and is on serial monitoring) with children and siblings with active disease. She has also atrial fibrillation followed by Dr. Caryl Comes. Reports insidious onset of chronic cough in the last 1 year. It is stable since onset. It fluctuates between mild and severe in severity. It is mostly dry in quality. Mostly present in the daytime but sometimes also wakes her up at night. It is definitely not progressive. It is episodic and present every day. Aggravated by talking sometimes and then the throat feels dry associated with tickle in the throat and also sensation the cough is coming from the upper chest retrosternally and sometimes relieved by drinking water or chewing on a lozenge. Also aggravated by seasonal changes particularly in the spring and the fall which makes her think she has allergies. There is sometimes associated gag. She also reports nonspecific occasional wheezing and associated shortness of breath that is nonspecific but present with exertion and relieved by rest No other clear cut aggravating or relieving factors.   cough associated history  - Medications: She is not on fish oil or ACE inhibitors - Sinus drainage: She does admit to spring allergies. She did have something removed from her hard palate several years ago by ENT does not know details - Acid reflux: She says she is significant acid reflux and is on Prilosec. Without which she'll have significant symptoms - Pulmonary disease: FenO 12 02/10/2016  and normal. She denies any personal history of asthma or  pulmonary fibrosis of COPD or emphysema smoked one pack per day started smoking at age 66 and quit in 1999. Making a 42 pack smoking history. Chest x-ray 08/26/2014 personally visualized is clear - Tobacco :  reports that she quit smoking about 18 years ago. She has a 42.00 pack-year smoking history. She has never used smokeless tobacco.       OV 02/18/2016  Chief Complaint  Patient presents with  . Follow-up    Pt here after PFT and HRCT. Pt denies changes in SOB and cough. Pt denies any new complaints at this time.    Follow-up chronic cough. This visit is to follow-up on test results of function test and high resolution CT chest which are described below9 results show isolated reduction in diffusion capacity which can be explained by possible early ILD and also emphysema. There are also new findings of nodule 6 mm bilaterally on lower lobes. She prefers a very cautious approach to this evaluation.     Pulmonary function test 02/12/2016 - FVC 2.8 cm/101%, FEV1 1.9 L/91%. Ratio 60/90%. Total lung capacity 115%. DLCO reduced at 14.5/60%. She is isolated reduction in diffusion capacity    HRCT chest 02/16/16 IMPRESSION: 1. Mild patchy subpleural reticulation in both lungs with a basilar predominance. No significant traction bronchiectasis. No frank honeycombing. Findings could represent an interstitial lung disease such as nonspecific interstitial pneumonia (NSIP), with early usual interstitial pneumonia (UIP) not excluded. A follow-up high-resolution chest CT study in 12 months is recommended to assess temporal pattern stability. 2. Bilateral lower lobe  solid pulmonary nodules, largest 6 mm. Non-contrast chest CT at 3-6 months is recommended. If the nodules are stable at time of repeat CT, then future CT at 18-24 months (from today's scan) is considered optional for low-risk patients, but is recommended for high-risk patients. This recommendation follows the consensus statement:  Guidelines for Management of Incidental Pulmonary Nodules Detected on CT Images:From the Fleischner Society 2017; published online before print (10.1148/radiol.[id number]). 3. Additional findings include mild-to-moderate centrilobular emphysema, aortic atherosclerosis and 2 vessel coronary atherosclerosis.   Electronically Signed   By: Ilona Sorrel M.D.   On: 02/16/2016 14:14   OV 08/31/2016  Chief Complaint  Patient presents with  . Follow-up    HRCT was never scheduled. Pt states that the cough is still present >> slightly improved since last OV. Pt states that she feels her allergies has gotten it ramped up again.    Follow-up multifactorial cough associated with autoimmune antibody positive, interstitial lung disease and emphysema previous history of smoking  After last visit in August 2017 she had high resolution CT chest in December 2017 that showed persistence of ILD. I personally visualized the CT chest. He comes for follow-up. She tells me that since August 2017 she has insidious onset of shortness of breath and is progressively worse. It is mild and present only on inclines is not therefore tenderness. In terms of her cough is persistent. It is associated with significant sinus drainage that she thinks is allergy related. She constantly clears her throat. Lab review shows autoimmune antibody positive in August 2017 with scleroderma antibody slightly elevated at 6.4. I referred her to rheumatology but she does not remember this and she did not make this follow-up. She also tells me that she definitely has a long history of [name] phenomena in her fingers but no one has ever formally diagnosed with connective tissu  e disease.  OV 11/28/2016  Chief Complaint  Patient presents with  . Follow-up    Pt here after PFT. Pt states her breathing is uchanged since last OV. Pt [name]/o dry cough.- pt states this has slightly improved since last OV. Pt denies CP/tightness and f/[name]/s.      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 Since then she has seen rheumatologis Dr. Keturah Barre. I reviewd the notes and also discussed with the patient wa a good understading of what s going on. She tells me that some of the lupus antibodies have been positive correlating with Raynaud. Patient is on beta blocker for atrial fbrillation and it is the preference by the rheumatologist Dr. D that she switc to calcium channel blocker. She has a follow-up appointment pending with her electrophysiologist Dr. Caryl Comes.  She is scleroderma antibody positive but according to her rheumtolgist she does not have dermatologic  Manifestation of the disese. 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 She pulm  function test today and this is stable. In terms of arrest or symptoms she stable. The cough is actually improved. But she does get dyspneic walking up stairs especially a few flights. She does not wnt her to pulmonary habilitation because se exrcises on the treadmill although she does not monitor her saturations.   reports that she quit smoking about 19 years ago. She has a 42.00 pack-year smoking history. she has never used smokeless tobacco.   OV 06/12/2017  Chief Complaint  Patient presents with  . Follow-up    Pt states that she has been doing good since last visit. States that she has a "tickle cough" that is sporadic and has SOB that she states is when she climbs 2 flights of stairs. Denies any CP.    Follow-up combined emphysema was interstitial lung disease [autoimmune undifferentiated connective tissue disease interstitial lung disease]. Autoimmune features from May 2018 rheumatology noted with Dr. Keturah Barre: Raynaud phenomenon positive without gangrene, positive anti-cardiolipin and positive IgM and SCL-70 positive without clinical features of  scleroderma    Last seen June 2018. Since then she's stable. Overall she tells me that only problem is a tickle in her throat and slight cough. This is up and down depending on the pollen exposure she gets. She gets dyspneic for climbing few to several flights of stairs. This is unchanged. She did have full function test today and that shows mild significant worsening though overall gradient is only mild.. However she's not feeling this. She has appointment with Dr. Keturah Barre  pending    OV 07/18/2017  Chief Complaint  Patient presents with  . Follow-up    HRCT done 07/03/17.  Pt states she is the same as she was at last visit. SOB with exertion.    Follow-up combined emphysema was interstitial lung disease [autoimmune undifferentiated connective tissue disease interstitial lung disease]. Autoimmune features from May 2018 rheumatology noted with Dr. Keturah Barre: Raynaud phenomenon positive without gangrene, positive anti-cardiolipin and positive IgM and SCL-70 positive without clinical features of scleroderma    [name] presents for follow-up.  She is here to discuss the results of a high-resolution CT scan of the chest.  The scan was reviewed by Dr. Rosario Jacks thoracic radiology who feels that patient has probable UIP pattern that is definitely progressive compared to August 2017 although there is no comment about progression since June 2018 CT scan.  The pulmonary nodules itself are stable since August 2017 and are likely benign.  The change in the CT scan which is mild progression pulmonary fibrosis corresponds with the pulmonary function test that shows mild progression.  She is now here with her daughter Junie Panning and her husband.  She also states that 1 of her other daughters has rheumatoid arthritis and is on TNF alpha blockade.  All her 3 daughters have hemo-chromatosis    OV 08/29/2017  Chief Complaint  Patient presents with  . Follow-up    Pt states she has been doing good since last visit. Pt  recently went to [place] and had some mild problems with SOB due to heat.  Pt was prescribed cellcept and bactrim but has not yet started meds due to questions with other meds she is taking.    79 year old female with ILD secondary to autoimmune disease clinically suspicious of scleroderma but also previous history of hemochromatosis with family history of hemochromatosis.  She is here with her husband.  This visit is only a discussion visit.  Because she has many questions about starting CellCept before she actually does.  We did hepatitis virus panel, QuantiFERON gold, G6PD and all this is normal.  Lab work is normal.  I checked with [facility] pharmacist about interactions and was cleared for her to start CellCept.  The only recommendation was for patient to come off Prilosec because of reduced effect of CellCept.  Patient questions revolved around CellCept: She does not want to come off PPI because of hiatal hernia and has had bad acid reflux.  So  I would not Willis Modena asking for any alternative PPI.  In addition she states she takes multiple multivitamins including Biotene, vitamin D3, vitamin D, vitamin E93 and folic acid.  She also takes Allegra occasionally.  She wants to make sure it is all okay with CellCept.  She has not had a flu shot today and she is asking if she should have it.  She has never had flu shot before.  She is also wondering about anticoagulation in the setting of CellCept and thyroid issues in the setting of Bactrim.  In terms of her genetics: Initially I corresponded with our local geneticist who thought she might be better served at [facility] but the [facility] genetics person wrote to me saying that patient should first be seen by [facility] rheumatology and hematology.  Patient's not so sure she wants to see the subspecialist.  She will speak to Dr. Beryle Beams her local hematologist to see if it is worthwhile seeing the hematology department at [facility].   Lungs/Pleura: Moderate centrilobular emphysema. Mild basilar predominant subpleural reticulation and ground-glass, increased from 02/16/2016. No traction bronchiectasis/ bronchiolectasis, architectural distortion or honeycombing. Side-by-side nodules in the medial left lower lobe measure up to 6 mm (series 3, image 95), unchanged from 02/16/2016 and considered benign. No air trapping. No pleural fluid. Airway is unremarkable.  IMPRESSION: 1. Mild progression in basilar predominant subpleural fibrosis from 02/16/2016, raising suspicion for usual interstitial pneumonitis. 2. Aortic atherosclerosis (ICD10-170.0). Moderate coronary artery calcification. 3.  Emphysema (ICD10-J43.9).   Electronically Signed   By: Lorin Picket M.D.   On: 07/03/2017 09:54    OV 09/19/2017  Chief Complaint  Patient presents with  . Follow-up    Pt states she has been doing good. Currently on cellcept and bactrim and has been doing well on it once put back on omeprazole. Pt still has the cough and sometimes when trying to take a deep breath can't fully get all O2 out.   [name] returns for follow-up.  This is for autoimmune interstitial lung disease.  She was started on CellCept recently.  She is here to follow-up for therapeutic drug monitoring.  Because she was on omeprazole and we were initiating CellCept with Bactrim we asked her to hold off on omeprazole because the omeprazole impairs drug levels of CellCept.  However she tried it for a week and her acid reflux was significant despite ranitidine so she went back on omeprazole.  Pharmacist Willis Modena is advising changing the CellCept to equivaekbtdose of Myfortic. So far tolerating cellcept/pred/bactrimm well. Has seen dR G in heme and on observation. Had PFT today on cellcept/pred/bactrim and is better! Continue siwht spriva  OV 10/31/2017  . Chief Complaint  Patient presents with  . Follow-up    Pt currently taking  myfortic and states she has been well on that. Pt states she has some mild nausea and has some problems with sleeping at night. SOB is stable,mild coughing. Denies any CP.    Follow-up progressive interstitial lung disease with SCL-70 antibody positive and Raynard Follow-up associated emphysema.  Last visit September 19, 2017.  At that time she had intolerance to the CellCept therefore we switched her to Myfortic.  She is here to report for follow-up about this.  She is tolerating Myfortic just well.  She had some transient insomnia for a few days last week but this resolved.  She only has mild intermittent occasional nausea with the Myfortic but otherwise is tolerating it really well at  the low-dose of 360 mg twice daily.  She continues on prednisone 10 mg daily and Bactrim 3 times a week.  She is due for blood work today.  There are no other new issues.  She continues on Spiriva for her emphysema   OV 02/01/2018  Chief Complaint  Patient presents with  . Follow-up    PFT performed today.  Pt stated other than having shingles earlier, things have been doing good for her. Pt states SOB is stable and states she still has an occ cough.    Follow-up progressive interstitial lung disease with SCL-70 antibody positive and Raynud - IPAF Follow-up associated emphysema   [name] presents for routine follow-up with interstitial lung disease clinic. She has the above problems. In the interim she tells me she had developed shingles on the right neck but this is resolved. This is despite having zoster vaccine in 2012. She was yet to have the bnnew  shingles vaccine.in any event currently she is feeling well. She only has occasional post residual neuropathic pain. In terms of her shortness of breath it is stable and minimal. Walking desaturation test shows stability. Pulmicort function test below show stability. She is tolerating the mycophenolate myfortic well at 360 mg twice daily and prednisone 10 mg  daily and Bactrim for PCP prophylaxis. Overall she feels stable and feels that it is best medicines on touch in terms of dosing change  OV 08/14/2018  Subjective:  Patient ID: [name], female , DOB: 12-Aug-1940 , age 35 y.o. , MRN: [id number] , ADDRESS: [place] [id number]   08/14/2018 -   Chief Complaint  Patient presents with  . Follow-up    PFT performed today.  Pt states she is slowly getting better after her recent pna. States she still has some issues with SOB and an occ cough. Pt also states that she feels like she has a weight on her chest at times.    Follow-up progressive interstitial lung disease with SCL-70 antibody positive and Raynud - IPAF - on myfortic since march 2019 with pred 10 and bactrim  Follow-up associated emphysema - on spiriva    HPIfortic [name] 79 y.o. -returns for 90-monthfollow-up of her interstitial lung disease associated with emphysema.  She presents with her middle daughter EJunie Panningwho is met me before.  She continues on Myfortic and prednisone 10 mg and Bactrim prophylaxis.  She continues on Spiriva.  She had an episode of atrial fibrillation according to review of the chart and my on history with in the middle of January 2020.  At this time she had a chest x-ray that showed asymptomatic upper lobe pneumonia.  I personally visualized the chest x-ray and confirmed the findings.  Antibiotic was not prescribed.  Then I follow this up with a CT scan of the chest [we canceled her elective colonoscopy].  CT chest showed improved pneumonia findings.  I prescribed even though she was feeling well cephalexin for a week.  She says she took it.  She continues to feel well except she has baseline amount of symptoms that is documented below.  Of note the high-resolution CT chest showed an improvement in ILD compared to a year ago.  In fact her pulmonary function tests are also showing stability/improvement as documented below.  She is  very happy about this outcome with immunosuppression .  However, she is a little bit unsure why she is symptomatic in terms of shortness of breath  and cough.  She is very concerned that her fibrosis is getting worse.  She wants to have closer follow-up than every 6 months.   IMPRESSION: HRCT Jan 2020 1. Vaguely bandlike patchy consolidation and ground-glass opacity at the periphery of the right upper and superior segment right lower lobes, new since 07/03/2017 chest CT, improving compared to 07/10/2018 chest radiograph, favoring resolving pneumonia with evolving postinfectious scarring. 2. Previously described basilar predominant subpleural reticulation and ground-glass attenuation is minimal and appears decreased in the interval. No bronchiectasis or honeycombing. Findings may represent an interstitial lung disease such as nonspecific interstitial pneumonia (NSIP) or less likely early usual interstitial pneumonia (UIP). Findings are indeterminate for UIP per consensus guidelines: Diagnosis of Idiopathic Pulmonary Fibrosis: An Official ATS/ERS/JRS/ALAT Clinical Practice Guideline. [place], Iss 5, [phone], Feb 25 2017. 3. Moderate centrilobular emphysema with mild diffuse bronchial wall thickening, suggesting COPD. 4. Two-vessel coronary atherosclerosis. 5. Small hiatal hernia.  Aortic Atherosclerosis (ICD10-I70.0) and Emphysema (ICD10-J43.9). ROS - per HPI    OV 04/22/2019  Subjective:  Patient ID: [name], female , DOB: [birth year] , age 47 y.o. , MRN: [id number] , ADDRESS: [place] [id number]   Follow-up progressive interstitial lung disease with SCL-70 antibody positive and Raynud - IPAF - on myfortic since march 2019 with pred 5 and bactrim 3x/week  Follow-up associated emphysema - on spiriva   04/22/2019 -   Chief Complaint  Patient presents with  . Follow-up    Patient reports that her breathing is doing well  at this time.      HPI [name] 79 y.o. -presents for follow-up.  Last seen by myself in early part of 2020 before the pandemic.  At that time CT chest suggested possible progression.  She saw a nurse practitioner in the interim after the onset of the pandemic.  She was anxious about the progression of her ILD so she asked for a CT scan in less than 1 year.  A CT scan of the chest was done in September 2020.  This is deemed stable since early part of the year but with possible progression versus stability since January 2019.  In terms of her symptoms she continues to be stable with very minimal shortness of breath and cough.  Her pulmonary function test below also shows stability.  Her walking desaturation test also shows stability.  She is tolerating her Myfortic, prednisone and 3 times a week of Bactrim just fine.  There are no side effects.  Last blood work was in August 2020 and this was reviewed.  We discussed the possibility of switching to antifibrotic nintedanib instead of taking immunosuppressants.  The rational for this would be it is a safer drug with less long-term side effect such as lack of immunosuppression.  However she feels her current regimen is working well for her.  She also feels that she would benefit more from a immunosuppressive drug because of her autoimmune antibodies and therefore she is more inclined to continue with her current immunosuppressive regimen of prednisone 5 mg/day and Myfortic and Bactrim 3 times a week.        OV 07/04/2019  Subjective:  Patient ID: [name], female , DOB: Jan 12, 1941 , age 61 y.o. , MRN: [id number] , ADDRESS: [place] [id number]   Follow-up mildy progressive - stable interstitial lung disease with SCL-70 antibody positive and Raynud - IPAF - on myfortic since march 2019  with pred 5 and bactrim 3x/week  Follow-up associated emphysema  Follow-up Raynard  Has associated hemochromatosis  Has  associated atrial fibrillation on Xarelto   07/04/2019 -   Chief Complaint  Patient presents with  . Follow-up    Pt states she has been doing well since last visit and denies any complaints.     HPI [name] 79 y.o. -last visit was in October 2020.  At that time she was doing well.  Given her continued immunosuppression with prednisone and Myfortic we discussed the possibility of switching the Myfortic to the nintedanib but understanding that there uncertainties whether this is superior approach and if she could end up with temporary side effects of the nintedanib.  Based on that she decided to continue with prednisone, Myfortic and Bactrim as she was doing because things are working well.  However she later called in and wanted this change to nintedanib.  We did this change to nintedanib but then her co-pay ended up being over 500+ dollars and then she started also getting worried about the side effects with nintedanib particularly GI.  She is also on Xarelto for atrial fibrillation.  Therefore she made this visit ahead of schedule to discuss the possibility of making the switch.  I understand from her that it will be 500+ dollars every month with nintedanib as a co-pay.  In addition she is worried about the superiority of taking nintedanib.  She fully understands that Myfortic over length of time can carry cancer risk and immunosuppression risk but at this point in time she feels well and she is tolerating it well.  She also understands that nintedanib can carry temporary GI side effects.  She also understands that it may not be a superior approach.  She understands it could be an equivalent approach and definitely lower immunosuppression.  Her main systemic immunosuppressive features are lung disease and Raynaud.  She is worried this might flareup.  In terms of her symptoms there is stable as documented below.  Her walking desaturation test is also stable.   Last pulmonary function test  was in August r 2020 Last CT scan of the chest was in September 2020 Last blood work October 2020 Last echocardiogram 2012.  ROS - per HPI  OV 10/22/2019  Subjective:  Patient ID: [name], female , DOB: [birth year] , age 21 y.o. , MRN: [id number] , ADDRESS: [place] [id number]   Follow-up mildy progressive - stable interstitial lung disease with SCL-70 antibody positive and Raynud - IPAF - on myfortic since march 2019 with pred 5 and bactrim 3x/week  Follow-up associated emphysema  Follow-up Raynaud  Has associated hemochromatosis  Has associated atrial fibrillation on Xarelto    10/22/2019 -   Chief Complaint  Patient presents with  . Follow-up    PFT performed today. Pt states she has been doing good since last visit. Denies any complaints with breathing.     HPI [name] 79 y.o. -returns for routine follow-up.  Overall doing well.  She feels shortness of breath is stable.  She feels she is tolerating Myfortic and prednisone fine no new interim issues she has had a Covid vaccine.  She continues on high risk immunosuppressive therapy.  There is some mild diarrhea with the Myfortic but it is tolerable.  The shortness of breath scale is stable walking desaturation test is stable.  Her pulmonary function test shows stable FVC with a possible reduction in diffusion capacity.  I do not know what to make of this at this point in time.  It could be a technical issue because she is feeling stable.  She did have echocardiogram that showed grade 2 diastolic dysfunction.   She continues on her Spiriva for associated emphysema.  ROS - per HPI     SYMPTOM SCALE - ILD 08/14/2018  04/22/2019  07/04/2019  10/22/2019   O2 use *RA     Shortness of Breath 0 -> 5 scale with 5 being worst (score 6 If unable to do)     At rest 1 0 0 0.5  Simple tasks - showers, clothes change, eating, shaving 1.5 0 .5 0.5  Household (dishes, doing bed, laundry) 1.5 1  0.5 0.5  Shopping 1.5 0 0/5 0.5  Walking keeping up with others of same age 42 0 0.5 0.5  Walking up Stairs 2.5 1 0.5 0/5  Total (40 - 48) Dyspnea Score 10- 2 2.5 3  How bad is your cough? 1.5 1.5 0.5 1  How bad is your fatigue 1.'5 1 1 2  '$ nausea    0  vomit    0  dairrhea    2.5  anxiety    0  deoresoj    0    Simple office walk 185 feet x  3 laps goal with forehead probe 07/04/2019  10/22/2019   O2 used ra ra  Number laps completed 3 3  Comments about pace avg   Resting Pulse Ox/HR 98% and 74/min 100% and 66  Final Pulse Ox/HR 98% and 87/min 97% and 81  Desaturated </= 88% no no  Desaturated <= 3% points no no  Got Tachycardic >/= 90/min no no  Symptoms at end of test Mild dyspnea Very mild dyspnea  Miscellaneous comments x       Results for Tara, [name] (MRN [id number]) as of 07/04/2019 10:36  Ref. Range 02/12/2016 12:57 11/28/2016 10:53 06/12/2017 11:18 09/19/2017 11:48 02/01/2018 12:09 08/14/2018 13:28 02/18/2019 14:10 10/22/2019   FVC-Pre Latest Units: L 2.85 2.81 2.68 3.04 2.87 2.92 2.82 2.83.  FVC-%Pred-Pre Latest Units: % 101 101 97 111 105 108 104 106   Results for ARDITH, TEST (MRN [id number]) as of 07/04/2019 10:36  Ref. Range 02/12/2016 12:57 11/28/2016 10:53 06/12/2017 11:18 09/19/2017 11:48 02/01/2018 12:09 08/14/2018 13:28 02/18/2019 14:10 10/22/2019   DLCO unc Latest Units: ml/min/mmHg 14.07 12.93 12.17 13.83 13.07 13.19 15.31 12.46  DLCO unc % pred Latest Units: % 58 54 50 57 54 70 81 66%     IMPRESSION: 1. Very mild basilar predominant subpleural reticulation and ground-glass, similar to minimally progressive from 07/03/2017. Nonspecific interstitial pneumonitis is favored. Findings are indeterminate for UIP per consensus guidelines: Diagnosis of Idiopathic Pulmonary Fibrosis: An Official ATS/ERS/JRS/ALAT Clinical Practice Guideline. Anderson, Iss 5, [phone], Feb 25 2017. 2. Aortic atherosclerosis (ICD10-170.0). Coronary  artery calcification. 3. Enlarged pulmonic trunk, indicative of pulmonary arterial hypertension. 4.  Emphysema (ICD10-J43.9).   Electronically Signed   By: Lorin Picket M.D.   On: 03/20/2019 14:47  Echocardiogram -January 2021: Shows grade 2 diastolic dysfunction but otherwise unremarkable.    has a past medical history of Atrial fibrillation ([place]), Chest pain, Depression with anxiety, GERD (gastroesophageal reflux disease), Hemochromatosis, History of syncope, and Hypothyroidism.   reports that she quit smoking about 22 years ago. She has a 42.00 pack-year smoking history. She has never used smokeless tobacco.  Past Surgical History:  Procedure Laterality Date  . MOUTH  SURGERY    . SHOULDER ARTHROSCOPY Left   . TOTAL ABDOMINAL HYSTERECTOMY    . VARICOSE VEIN SURGERY      No Known Allergies  Immunization History  Administered Date(s) Administered  . Fluad Quad(high Dose 65+) 02/18/2019  . Influenza, High Dose Seasonal PF 08/29/2017, 04/26/2018  . PFIZER SARS-COV-2 Vaccination 07/22/2019, 08/12/2019  . Pneumococcal Conjugate-13 06/12/2014  . Pneumococcal Polysaccharide-23 06/27/2010  . Tdap 03/16/2011  . Zoster 06/27/2010  . Zoster Recombinat (Shingrix) 04/08/2019    Family History  Problem Relation Age of Onset  . Stroke Mother   . Heart disease Father 58  . Hemochromatosis Brother      Current Outpatient Medications:  .  acetaminophen (TYLENOL) 325 MG tablet, Take 650 mg by mouth every 6 (six) hours as needed for mild pain or headache., Disp: , Rfl:  .  atorvastatin (LIPITOR) 10 MG tablet, Take 10 mg by mouth daily., Disp: , Rfl:  .  Cyanocobalamin (VITAMIN B 12 PO), Take 1,000 mcg by mouth daily., Disp: , Rfl:  .  flecainide (TAMBOCOR) 100 MG tablet, Take 1 tablet (100 mg total) by mouth 2 (two) times daily., Disp: 180 tablet, Rfl: 2 .  folic acid (FOLVITE) 1 MG tablet, Take 1 mg by mouth daily.  , Disp: , Rfl:  .  levothyroxine (SYNTHROID, LEVOTHROID) 112  MCG tablet, Take 112 mcg by mouth daily.  , Disp: , Rfl:  .  methocarbamol (ROBAXIN) 500 MG tablet, Take 1 tablet (500 mg total) by mouth daily as needed., Disp: 30 tablet, Rfl: 0 .  metoprolol succinate (TOPROL-XL) 25 MG 24 hr tablet, TAKE 1 TABLET BY MOUTH  DAILY (Patient taking differently: Take 25 mg by mouth daily. ), Disp: 90 tablet, Rfl: 2 .  mycophenolate (MYFORTIC) 360 MG TBEC EC tablet, TAKE 1 TABLET BY MOUTH TWICE DAILY (Patient taking differently: Take 360 mg by mouth 2 (two) times daily. ), Disp: 180 tablet, Rfl: 3 .  omeprazole (PRILOSEC) 40 MG capsule, Take 40 mg by mouth daily., Disp: , Rfl:  .  ondansetron (ZOFRAN ODT) 4 MG disintegrating tablet, Take 1-2 tablets three times daily as needed, Disp: 42 tablet, Rfl: 1 .  predniSONE (DELTASONE) 5 MG tablet, TAKE 1 TABLET(5 MG) BY MOUTH DAILY WITH BREAKFAST, Disp: 30 tablet, Rfl: 2 .  rivaroxaban (XARELTO) 20 MG TABS tablet, TAKE 1 TABLET BY MOUTH ONCE DAILY WITH FOOD (Patient taking differently: Take 20 mg by mouth daily. TAKE 1 TABLET BY MOUTH ONCE DAILY WITH FOOD), Disp: 90 tablet, Rfl: 2 .  SPIRIVA RESPIMAT 2.5 MCG/ACT AERS, INHALE 2 PUFFS INTO THE LUNGS DAILY (Patient taking differently: Inhale 2 puffs into the lungs daily. ), Disp: 4 g, Rfl: 11 .  sulfamethoxazole-trimethoprim (BACTRIM DS) 800-160 MG tablet, Take 1 tablet by mouth 3 (three) times a week. Takes on M/W/F, Disp: , Rfl:  .  Vitamin D, Ergocalciferol, (DRISDOL) 1.25 MG (50000 UNIT) CAPS capsule, Take 1 capsule (50,000 Units total) by mouth every 7 (seven) days., Disp: 12 capsule, Rfl: 0      Objective:   Vitals:   10/22/19 1204  BP: 112/70  Pulse: 67  Temp: (!) 97.3 F (36.3 [name])  TempSrc: Temporal  SpO2: 99%  Weight: 145 lb (65.8 kg)  Height: 5' 3.75" (1.619 m)    Estimated body mass index is 25.08 kg/m as calculated from the following:   Height as of this encounter: 5' 3.75" (1.619 m).   Weight as of this encounter: 145 lb (65.8  kg).  @  Luan Pulling  Filed Weights   10/22/19 1204  Weight: 145 lb (65.8 kg)     Physical Exam Mild thickening of the skin like a scleroderma patient.  No specific crackles.  Otherwise nonfocal no cyanosis.  No other stigmata of connective tissue disease.  Alert and oriented x3.  Pleasant.       Assessment:       ICD-10-CM   1. ILD (interstitial lung disease) (HCC)  J84.9 CBC w/Diff    Hepatic function panel    Basic Metabolic Panel (BMET)    Pulmonary function test  2. High risk medication use  Z79.899 CBC w/Diff    Hepatic function panel    Basic Metabolic Panel (BMET)  3. Therapeutic drug monitoring  Z51.81 CBC w/Diff    Hepatic function panel    Basic Metabolic Panel (BMET)       Plan:     Patient Instructions  Interstitial pulmonary disease (HCC) Scl-70 antibody positive [name] phenomenon without gangrene High risk medication use Therapeutic drug monitoring  - ILD itself is stable based on symptoms and walk test  - cotninue  prednisone to 5 mg per day +  myfortic and bactrim at current dose -  Hold off on ofev given fact things are working well currently  - do cbc, bmet, lft 1.td - avoid CT scans < 1 year in frequency unless clinically indicated  - last sept 2020   Pulmonary emphysema, unspecified emphysema type ([place]) -  Continue spiriva  Followup 4-6 months do spirometry and dlco Return to 30  Min slot in 4-6 months but after spirometry; symptioms screen and walk test at followui    SIGNATURE    Dr. Brand Males, M.D., [name],  Pulmonary and Critical Care Medicine Staff Physician, [place] Director - Interstitial Lung Disease  Program  [facility] [place], [place], [address]  Pager: [phone], If no answer or between  15:00h - 7:00h: call [phone] Telephone: [phone]  2:29 PM 10/22/2019

## 2019-10-22 NOTE — Patient Instructions (Addendum)
Interstitial pulmonary disease (HCC) Scl-70 antibody positive [name] phenomenon without gangrene High risk medication use Therapeutic drug monitoring  - ILD itself is stable based on symptoms and walk test  - cotninue  prednisone to 5 mg per day +  myfortic and bactrim at current dose -  Hold off on ofev given fact things are working well currently  - do cbc, bmet, lft 1.td - avoid CT scans < 1 year in frequency unless clinically indicated  - last sept 2020   Pulmonary emphysema, unspecified emphysema type ([place]) -  Continue spiriva  Followup 4-6 months do spirometry and dlco Return to 30  Min slot in 4-6 months but after spirometry; symptioms screen and walk test at followui

## 2019-11-04 ENCOUNTER — Encounter: Payer: Self-pay | Admitting: Rheumatology

## 2019-11-11 ENCOUNTER — Other Ambulatory Visit: Payer: Self-pay | Admitting: Internal Medicine

## 2019-11-26 NOTE — Progress Notes (Signed)
Office Visit Note  Patient: [name]             Date of Birth: Dec 14, 1940           MRN: [id number]             PCP: Kathyrn Lass, MD Referring: Kathyrn Lass, MD Visit Date: 12/09/2019 Occupation: @GUAROCC @  Subjective:  [name] phenomenon.   History of Present Illness: [name] is a 79 y.o. female with history of [name] and interstitial lung disease.  She states that her lung symptoms are stable on current regimen.  She is still on low-dose prednisone.  She was prescribed Fosamax by her PCP.  She states she has not picked the prescription yet but she is planning to start it.  Her [name] is not very bothersome.  She has not noticed any changes in her skin.  The popliteal cyst has not changed and is not bothersome.  Activities of Daily Living:  Patient reports morning stiffness for  Less than 1  minute.   Patient Reports nocturnal pain.  Difficulty dressing/grooming: Denies Difficulty climbing stairs: Denies Difficulty getting out of chair: Denies Difficulty using hands for taps, buttons, cutlery, and/or writing: Denies  Review of Systems  Constitutional: Negative for fatigue, night sweats, weight gain and weight loss.  HENT: Positive for mouth dryness. Negative for mouth sores, trouble swallowing, trouble swallowing and nose dryness.   Eyes: Negative for pain, redness, itching, visual disturbance and dryness.  Respiratory: Negative for cough, shortness of breath and difficulty breathing.   Cardiovascular: Negative for chest pain, palpitations, hypertension, irregular heartbeat and swelling in legs/feet.  Gastrointestinal: Negative for blood in stool, constipation and diarrhea.  Endocrine: Negative for increased urination.  Genitourinary: Negative for difficulty urinating, painful urination and vaginal dryness.  Musculoskeletal: Positive for morning stiffness. Negative for arthralgias, joint pain, joint swelling, myalgias, muscle weakness, muscle  tenderness and myalgias.  Skin: Positive for color change. Negative for rash, hair loss, redness, skin tightness, ulcers and sensitivity to sunlight.  Allergic/Immunologic: Negative for susceptible to infections.  Neurological: Negative for dizziness, numbness, headaches, memory loss, night sweats and weakness.  Hematological: Positive for bruising/bleeding tendency. Negative for swollen glands.  Psychiatric/Behavioral: Negative for depressed mood, confusion and sleep disturbance. The patient is not nervous/anxious.     PMFS History:  Patient Active Problem List   Diagnosis Date Noted  . High risk medication use 02/18/2019  . Therapeutic drug monitoring 02/18/2019  . Healthcare maintenance 02/18/2019  . ILD (interstitial lung disease) ([place]) 11/28/2016  . Multiple lung nodules on CT 11/28/2016  . Abnormal laboratory test 11/18/2016  . DDD L spine 11/18/2016  . 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 History of hypothyroidism 11/10/2016  . History of hemochromatosis 11/10/2016  . Pulmonary emphysema ([place]) 08/31/2016  . [name] phenomenon without gangrene 08/31/2016  . Sinusitis, chronic 08/31/2016  . Chronic cough 02/10/2016  . Irritable larynx 02/10/2016  . Stopped smoking with greater than 40 pack year history 02/10/2016  . Chest pain, unspecified 10/06/2010  . Shortness of breath 10/06/2010  . Hypothyroidism   . Hereditary hemochromatosis ([place]) 04/22/2010  . ATRIAL FIBRILLATION 10/20/2008  . [facility] 10/20/2008  . SYNCOPE AND COLLAPSE 10/20/2008    Past Medical History:  Diagnosis Date  . Atrial fibrillation ([place])    a. Flecainide therapy;  b. event monitor 4/12  . Chest pain    a. GXT myoview 4/12: no isch., EF 86%;   b. echo 4/12: EF 55-65%, grade 1 diast dysfxn, LAE  .  Depression with anxiety   . GERD (gastroesophageal reflux disease)   . 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 Beryle Beams  . History of syncope   . Hypothyroidism     Family  History  Problem Relation Age of Onset  . Stroke Mother   . Heart disease Father 10  . Hemochromatosis Brother    Past Surgical History:  Procedure Laterality Date  . MOUTH SURGERY    . SHOULDER ARTHROSCOPY Left   . TOTAL ABDOMINAL HYSTERECTOMY    . VARICOSE VEIN SURGERY     Social History   Social History Narrative   REGULAR EXERCISE   Immunization History  Administered Date(s) Administered  . Fluad Quad(high Dose 65+) 02/18/2019  . Influenza, High Dose Seasonal PF 08/29/2017, 04/26/2018  . PFIZER SARS-COV-2 Vaccination 07/22/2019, 08/12/2019  . Pneumococcal Conjugate-13 06/12/2014  . Pneumococcal Polysaccharide-23 06/27/2010  . Tdap 03/16/2011  . Zoster 06/27/2010  . Zoster Recombinat (Shingrix) 04/08/2019     Objective: Vital Signs: BP 102/67 (BP Location: Left Arm, Patient Position: Sitting, Cuff Size: Normal)   Pulse 64   Resp 13   Ht 5' 3.5" (1.613 m)   Wt 145 lb 12.8 oz (66.1 kg)   BMI 25.42 kg/m    Physical Exam Vitals and nursing note reviewed.  Constitutional:      Appearance: She is well-developed.  HENT:     Head: Normocephalic and atraumatic.  Eyes:     Conjunctiva/sclera: Conjunctivae normal.  Cardiovascular:     Rate and Rhythm: Normal rate and regular rhythm.     Heart sounds: Normal heart sounds.  Pulmonary:     Effort: Pulmonary effort is normal.     Breath sounds: Normal breath sounds.  Abdominal:     General: Bowel sounds are normal.     Palpations: Abdomen is soft.  Musculoskeletal:     Cervical back: Normal range of motion.  Lymphadenopathy:     Cervical: No cervical adenopathy.  Skin:    General: Skin is warm and dry.     Capillary Refill: Capillary refill takes less than 2 seconds.  Neurological:     Mental Status: She is alert and oriented to person, place, and time.  Psychiatric:        Behavior: Behavior normal.      Musculoskeletal Exam: C-spine thoracic and lumbar spine with good range of motion.  Shoulder joints,  elbow joints, wrist joints, MCPs PIPs DIPs with good range of motion.  She has thickening of PIP and DIP joints of her hands and feet.  Knee joints and ankle joints with good range of motion with no synovitis.  CDAI Exam: CDAI Score: -- Patient Global: --; Provider Global: -- Swollen: --; Tender: -- Joint Exam 12/09/2019   No joint exam has been documented for this visit   There is currently no information documented on the homunculus. Go to the Rheumatology activity and complete the homunculus joint exam.  Investigation: No additional findings.  Imaging: No results found.  Recent Labs: Lab Results  Component Value Date   WBC 6.1 10/22/2019   HGB 11.8 (L) 10/22/2019   PLT 244.0 10/22/2019   NA 138 10/22/2019   K 4.2 10/22/2019   CL 103 10/22/2019   CO2 29 10/22/2019   GLUCOSE 91 10/22/2019   BUN 13 10/22/2019   CREATININE 0.74 10/22/2019   BILITOT 0.5 10/22/2019   ALKPHOS 60 10/22/2019   AST 13 10/22/2019   ALT 10 10/22/2019   PROT 6.6 10/22/2019  ALBUMIN 4.2 10/22/2019   CALCIUM 8.9 10/22/2019   GFRAA >60 09/22/2018   QFTBGOLDPLUS NEGATIVE 08/10/2017    Speciality Comments: No specialty comments available.  Procedures:  No procedures performed Allergies: Patient has no known allergies.   Assessment / Plan:     Visit Diagnoses: [name] phenomenon without gangrene-her [name] is not very active currently.  Warm clothing and keeping body temperature warm was discussed.  If her symptoms worsen she supposed to notify us.  Anticardiolipin antibody positive - Positive anticardiolipin IgM 47, positive beta-2 IgM 31: She is taking Xarelto 20 mg 1 tablet by mouth daily.  ILD (interstitial lung disease) ([place]) - Scl-70+.  She is followed by Dr. Chase Caller.  Patient denies any worsening of her pulmonary symptoms.  High risk medication use - She is on Myfortic 360 mg twice daily, Bactrim three times a week, and prednisone 5 mg daily prescribed by Dr.  Chase Caller.  Multiple lung nodules on CT - Most recent HRCT was on 03/20/19.  History of emphysema - Dr. Chase Caller recommended continuing on Spiriva.   DDD (degenerative disc disease), lumbar-she has chronic discomfort which is manageable.  Primary osteoarthritis of both hands-joint protection was discussed.  Trochanteric bursitis of both hips-currently not active.  Osteoporosis screening - She is postmenopausal and on long-term prednisone.  Patient was given prescription for Fosamax by her PCP which she has not started.  Counseling guarding Fosamax was provided.  Synovial cyst of left popliteal space-currently asymptomatic.  History of atrial fibrillation-she is on Xarelto.  History of hypothyroidism  History of hemochromatosis  Long term systemic steroid user  Former smoker  Orders: No orders of the defined types were placed in this encounter.  No orders of the defined types were placed in this encounter.   .  Follow-Up Instructions: Return in about 6 months (around 06/09/2020) for ILD, Raynauds.   Bo Merino, MD  Note - This record has been created using Editor, commissioning.  Chart creation errors have been sought, but may not always  have been located. Such creation errors do not reflect on  the standard of medical care.

## 2019-12-09 ENCOUNTER — Ambulatory Visit (INDEPENDENT_AMBULATORY_CARE_PROVIDER_SITE_OTHER): Payer: Medicare Other | Admitting: Rheumatology

## 2019-12-09 ENCOUNTER — Other Ambulatory Visit: Payer: Self-pay

## 2019-12-09 ENCOUNTER — Encounter: Payer: Self-pay | Admitting: Rheumatology

## 2019-12-09 VITALS — BP 102/67 | HR 64 | Resp 13 | Ht 63.5 in | Wt 145.8 lb

## 2019-12-09 DIAGNOSIS — M791 Myalgia, unspecified site: Secondary | ICD-10-CM

## 2019-12-09 DIAGNOSIS — Z79899 Other long term (current) drug therapy: Secondary | ICD-10-CM

## 2019-12-09 DIAGNOSIS — Z13820 Encounter for screening for osteoporosis: Secondary | ICD-10-CM

## 2019-12-09 DIAGNOSIS — R918 Other nonspecific abnormal finding of lung field: Secondary | ICD-10-CM

## 2019-12-09 DIAGNOSIS — M7061 Trochanteric bursitis, right hip: Secondary | ICD-10-CM

## 2019-12-09 DIAGNOSIS — I7300 Raynaud's syndrome without gangrene: Secondary | ICD-10-CM

## 2019-12-09 DIAGNOSIS — Z7952 Long term (current) use of systemic steroids: Secondary | ICD-10-CM

## 2019-12-09 DIAGNOSIS — Z8679 Personal history of other diseases of the circulatory system: Secondary | ICD-10-CM

## 2019-12-09 DIAGNOSIS — M5136 Other intervertebral disc degeneration, lumbar region: Secondary | ICD-10-CM

## 2019-12-09 DIAGNOSIS — Z8639 Personal history of other endocrine, nutritional and metabolic disease: Secondary | ICD-10-CM

## 2019-12-09 DIAGNOSIS — M7062 Trochanteric bursitis, left hip: Secondary | ICD-10-CM

## 2019-12-09 DIAGNOSIS — M51369 Other intervertebral disc degeneration, lumbar region without mention of lumbar back pain or lower extremity pain: Secondary | ICD-10-CM

## 2019-12-09 DIAGNOSIS — J849 Interstitial pulmonary disease, unspecified: Secondary | ICD-10-CM

## 2019-12-09 DIAGNOSIS — M7122 Synovial cyst of popliteal space [Baker], left knee: Secondary | ICD-10-CM

## 2019-12-09 DIAGNOSIS — R760 Raised antibody titer: Secondary | ICD-10-CM

## 2019-12-09 DIAGNOSIS — M19041 Primary osteoarthritis, right hand: Secondary | ICD-10-CM

## 2019-12-09 DIAGNOSIS — M19042 Primary osteoarthritis, left hand: Secondary | ICD-10-CM

## 2019-12-09 DIAGNOSIS — J439 Emphysema, unspecified: Secondary | ICD-10-CM

## 2019-12-09 DIAGNOSIS — Z87891 Personal history of nicotine dependence: Secondary | ICD-10-CM

## 2019-12-09 DIAGNOSIS — Z8709 Personal history of other diseases of the respiratory system: Secondary | ICD-10-CM

## 2019-12-10 ENCOUNTER — Other Ambulatory Visit: Payer: Self-pay

## 2019-12-10 ENCOUNTER — Telehealth: Payer: Self-pay

## 2019-12-10 MED ORDER — FLECAINIDE ACETATE 100 MG PO TABS: 100.0000 mg | ORAL_TABLET | Freq: Two times a day (BID) | ORAL | 0 refills | Status: DC

## 2019-12-10 NOTE — Telephone Encounter (Signed)
error 

## 2019-12-23 ENCOUNTER — Telehealth: Payer: Self-pay | Admitting: Internal Medicine

## 2019-12-23 NOTE — Telephone Encounter (Signed)
New message   Pt c/o medication issue:  1. Name of Medication: rivaroxaban (XARELTO) 20 MG TABS tablet  2. How are you currently taking this medication (dosage and times per day)? As written  3. Are you having a reaction (difficulty breathing--STAT)? No   4. What is your medication issue? Patient wants to see if her dose can be lowered. Please call to discuss.

## 2019-12-23 NOTE — Telephone Encounter (Signed)
Spoke with pt who reports increased bruising and is concerned her dose of Xarelto may be "too strong." Pt is open to reducing her current dose of Xarelto or changing to Eliquis.  Pt reports no additional symptoms and denies current active bleeding.  Will forward request to Dr Caryl Comes for review and recommendation.  Pt verbalizes understanding and agrees with current plan.

## 2019-12-24 NOTE — Telephone Encounter (Signed)
M thx  Her dose is correct based on blood work from 4/21  But we can try her on Apixoban   her dose would be 5 mg bid  Thanks SK

## 2019-12-25 MED ORDER — APIXABAN 5 MG PO TABS: 5.0000 mg | ORAL_TABLET | Freq: Two times a day (BID) | ORAL | 3 refills | Status: DC

## 2019-12-25 NOTE — Telephone Encounter (Signed)
Spoke with pt and advised per Dr Caryl Comes pt may change to Eliquis 5mg  1 tablet by mouth bid.  Pt verbalizes understanding and agrees with current plan.

## 2020-01-06 ENCOUNTER — Other Ambulatory Visit: Payer: Self-pay | Admitting: Internal Medicine

## 2020-01-06 NOTE — Telephone Encounter (Signed)
Pt is requesting refill on prednisone 5mg  lov was 10/22/19

## 2020-01-14 ENCOUNTER — Other Ambulatory Visit: Payer: Self-pay | Admitting: *Deleted

## 2020-01-14 MED ORDER — PREDNISONE 5 MG PO TABS: 5.0000 mg | ORAL_TABLET | Freq: Every day | ORAL | 3 refills | Status: DC

## 2020-02-28 ENCOUNTER — Ambulatory Visit (INDEPENDENT_AMBULATORY_CARE_PROVIDER_SITE_OTHER): Payer: Medicare Other | Admitting: Internal Medicine

## 2020-02-28 ENCOUNTER — Other Ambulatory Visit: Payer: Self-pay

## 2020-02-28 DIAGNOSIS — J849 Interstitial pulmonary disease, unspecified: Secondary | ICD-10-CM

## 2020-02-28 LAB — PULMONARY FUNCTION TEST
DL/VA % pred: 66 %
DL/VA: 2.71 ml/min/mmHg/L
DLCO cor % pred: 63 %
DLCO cor: 11.88 ml/min/mmHg
DLCO unc % pred: 63 %
DLCO unc: 11.88 ml/min/mmHg
FEF 25-75 Pre: 0.99 L/sec
FEF2575-%Pred-Pre: 66 %
FEV1-%Pred-Pre: 96 %
FEV1-Pre: 1.92 L
FEV1FVC-%Pred-Pre: 89 %
FEV6-%Pred-Pre: 112 %
FEV6-Pre: 2.85 L
FEV6FVC-%Pred-Pre: 103 %
FVC-%Pred-Pre: 108 %
FVC-Pre: 2.9 L
Pre FEV1/FVC ratio: 66 %
Pre FEV6/FVC Ratio: 98 %

## 2020-02-28 NOTE — Progress Notes (Signed)
Spirometry and DLCO completed today  ?

## 2020-03-03 ENCOUNTER — Other Ambulatory Visit: Payer: Self-pay

## 2020-03-03 ENCOUNTER — Encounter: Payer: Self-pay | Admitting: Internal Medicine

## 2020-03-03 ENCOUNTER — Ambulatory Visit (INDEPENDENT_AMBULATORY_CARE_PROVIDER_SITE_OTHER): Payer: Medicare Other | Admitting: Internal Medicine

## 2020-03-03 VITALS — BP 110/60 | HR 67 | Temp 96.2°F | Ht 63.5 in | Wt 139.6 lb

## 2020-03-03 DIAGNOSIS — Z79899 Other long term (current) drug therapy: Secondary | ICD-10-CM | POA: Diagnosis not present

## 2020-03-03 DIAGNOSIS — R768 Other specified abnormal immunological findings in serum: Secondary | ICD-10-CM | POA: Diagnosis not present

## 2020-03-03 DIAGNOSIS — J432 Centrilobular emphysema: Secondary | ICD-10-CM

## 2020-03-03 DIAGNOSIS — J849 Interstitial pulmonary disease, unspecified: Secondary | ICD-10-CM

## 2020-03-03 DIAGNOSIS — Z5181 Encounter for therapeutic drug level monitoring: Secondary | ICD-10-CM

## 2020-03-03 DIAGNOSIS — I7300 Raynaud's syndrome without gangrene: Secondary | ICD-10-CM

## 2020-03-03 LAB — CBC
HCT: 35.4 % — ABNORMAL LOW (ref 36.0–46.0)
Hemoglobin: 11.5 g/dL — ABNORMAL LOW (ref 12.0–15.0)
MCHC: 32.6 g/dL (ref 30.0–36.0)
MCV: 100.8 fl — ABNORMAL HIGH (ref 78.0–100.0)
Platelets: 216 10*3/uL (ref 150.0–400.0)
RBC: 3.51 Mil/uL — ABNORMAL LOW (ref 3.87–5.11)
RDW: 14.9 % (ref 11.5–15.5)
WBC: 6.8 10*3/uL (ref 4.0–10.5)

## 2020-03-03 LAB — BASIC METABOLIC PANEL
BUN: 11 mg/dL (ref 6–23)
CO2: 29 mEq/L (ref 19–32)
Calcium: 9.2 mg/dL (ref 8.4–10.5)
Chloride: 104 mEq/L (ref 96–112)
Creatinine, Ser: 0.67 mg/dL (ref 0.40–1.20)
GFR: 84.98 mL/min (ref >60.00)
Glucose, Bld: 107 mg/dL — ABNORMAL HIGH (ref 70–99)
Potassium: 4.1 mEq/L (ref 3.5–5.1)
Sodium: 141 mEq/L (ref 135–145)

## 2020-03-03 LAB — HEPATIC FUNCTION PANEL
ALT: 11 U/L (ref 0–35)
AST: 12 U/L (ref 0–37)
Albumin: 4.3 g/dL (ref 3.5–5.2)
Alkaline Phosphatase: 48 U/L (ref 39–117)
Bilirubin, Direct: 0.1 mg/dL (ref 0.0–0.3)
Total Bilirubin: 0.4 mg/dL (ref 0.2–1.2)
Total Protein: 6.6 g/dL (ref 6.0–8.3)

## 2020-03-03 NOTE — Addendum Note (Signed)
Addended by: Vanessa Barbara on: 03/03/2020 01:58 PM   Modules accepted: Orders

## 2020-03-03 NOTE — Addendum Note (Signed)
Addended by: Vanessa Barbara on: 03/03/2020 11:00 AM   Modules accepted: Orders

## 2020-03-03 NOTE — Patient Instructions (Addendum)
ICD-10-CM   1. ILD (interstitial lung disease) ([place])  J84.9   2. High risk medication use  Z79.899   3. Therapeutic drug monitoring  Z51.81   4. 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 [name] phenomenon without gangrene  I73.00   6. Centrilobular emphysema (HCC)  J43.2       Interstitial pulmonary disease (HCC) Scl-70 antibody positive [name] phenomenon without gangrene High risk medication use Therapeutic drug monitoring  - YOu have increased cough - - Need to ensure ILD is stable. Need to ensure increased cough is not from worsened ILD or opportunistic infection  Plan  - DO HRCT supine and prone sometime next few weeks    - might need bronchoscopy based on results  - cotninue  prednisone to 5 mg per day +  myfortic and bactrim at current dose -  Hold off on ofev given fact things are working well currently  - do cbc, bmet, LFT 03/03/2020  -  please have high-dose flu shot some 1-2 weeks after Covid vaccine   Pulmonary emphysema, unspecified emphysema type ([place]) -  Continue spiriva  Followup 4-6 months do spirometry and dlco Return to 30  Min slot in 4-6 months but after spirometry; symptioms screen and walk test at followui

## 2020-03-03 NOTE — Addendum Note (Signed)
Addended by: Suzzanne Cloud E on: 03/03/2020 11:04 AM   Modules accepted: Orders

## 2020-03-03 NOTE — Progress Notes (Signed)
Baseline anemia. Otherise stable labs   Lab             03/03/20                      1104         HGB          11.5*        HCT          35.4*        WBC          6.8          PLT          216.0         Lab             03/03/20                      1104         NA           141          K            4.1          CL           104          CO2          29           GLUCOSE      107*         BUN          11           CREATININE   0.67         CALCIUM      9.2

## 2020-03-03 NOTE — Progress Notes (Signed)
PCP [name],[name], PA-C   HPI  IOV 02/10/2016  Chief Complaint  Patient presents with  . Pulmonary Consult    Pt referred by Dr. Melford Aase for chronic cough x 1 year. Pt states she feels she has a tickle that is causing her dry cough. Pt states she has DOE when climbing stairs. Pt deneis CP/tightness.     79 year old female with hemochromatosis homozygous gene followed by Dr. Beryle Beams (last phlebotomy many years ago and is on serial monitoring) with children and siblings with active disease. She has also atrial fibrillation followed by Dr. Caryl Comes. Reports insidious onset of chronic cough in the last 1 year. It is stable since onset. It fluctuates between mild and severe in severity. It is mostly dry in quality. Mostly present in the daytime but sometimes also wakes her up at night. It is definitely not progressive. It is episodic and present every day. Aggravated by talking sometimes and then the throat feels dry associated with tickle in the throat and also sensation the cough is coming from the upper chest retrosternally and sometimes relieved by drinking water or chewing on a lozenge. Also aggravated by seasonal changes particularly in the spring and the fall which makes her think she has allergies. There is sometimes associated gag. She also reports nonspecific occasional wheezing and associated shortness of breath that is nonspecific but present with exertion and relieved by rest No other clear cut aggravating or relieving factors.   cough associated history  - Medications: She is not on fish oil or ACE inhibitors - Sinus drainage: She does admit to spring allergies. She did have something removed from her hard palate several years ago by ENT does not know details - Acid reflux: She says she is significant acid reflux and is on Prilosec. Without which she'll have significant symptoms - Pulmonary disease: FenO 12 02/10/2016  and normal. She denies any personal history of asthma or  pulmonary fibrosis of COPD or emphysema smoked one pack per day started smoking at age 46 and quit in 1999. Making a 42 pack smoking history. Chest x-ray 08/26/2014 personally visualized is clear - Tobacco :  reports that she quit smoking about 18 years ago. She has a 42.00 pack-year smoking history. She has never used smokeless tobacco.       OV 02/18/2016  Chief Complaint  Patient presents with  . Follow-up    Pt here after PFT and HRCT. Pt denies changes in SOB and cough. Pt denies any new complaints at this time.    Follow-up chronic cough. This visit is to follow-up on test results of function test and high resolution CT chest which are described below9 results show isolated reduction in diffusion capacity which can be explained by possible early ILD and also emphysema. There are also new findings of nodule 6 mm bilaterally on lower lobes. She prefers a very cautious approach to this evaluation.     Pulmonary function test 02/12/2016 - FVC 2.8 cm/101%, FEV1 1.9 L/91%. Ratio 60/90%. Total lung capacity 115%. DLCO reduced at 14.5/60%. She is isolated reduction in diffusion capacity    HRCT chest 02/16/16 IMPRESSION: 1. Mild patchy subpleural reticulation in both lungs with a basilar predominance. No significant traction bronchiectasis. No frank honeycombing. Findings could represent an interstitial lung disease such as nonspecific interstitial pneumonia (NSIP), with early usual interstitial pneumonia (UIP) not excluded. A follow-up high-resolution chest CT study in 12 months is recommended to assess temporal pattern stability. 2. Bilateral lower lobe solid  pulmonary nodules, largest 6 mm. Non-contrast chest CT at 3-6 months is recommended. If the nodules are stable at time of repeat CT, then future CT at 18-24 months (from today's scan) is considered optional for low-risk patients, but is recommended for high-risk patients. This recommendation follows the consensus statement:  Guidelines for Management of Incidental Pulmonary Nodules Detected on CT Images:From the Fleischner Society 2017; published online before print (10.1148/radiol.[id number]). 3. Additional findings include mild-to-moderate centrilobular emphysema, aortic atherosclerosis and 2 vessel coronary atherosclerosis.   Electronically Signed   By: Ilona Sorrel M.D.   On: 02/16/2016 14:14   OV 08/31/2016  Chief Complaint  Patient presents with  . Follow-up    HRCT was never scheduled. Pt states that the cough is still present >> slightly improved since last OV. Pt states that she feels her allergies has gotten it ramped up again.    Follow-up multifactorial cough associated with autoimmune antibody positive, interstitial lung disease and emphysema previous history of smoking  After last visit in August 2017 she had high resolution CT chest in December 2017 that showed persistence of ILD. I personally visualized the CT chest. He comes for follow-up. She tells me that since August 2017 she has insidious onset of shortness of breath and is progressively worse. It is mild and present only on inclines is not therefore tenderness. In terms of her cough is persistent. It is associated with significant sinus drainage that she thinks is allergy related. She constantly clears her throat. Lab review shows autoimmune antibody positive in August 2017 with scleroderma antibody slightly elevated at 6.4. I referred her to rheumatology but she does not remember this and she did not make this follow-up. She also tells me that she definitely has a long history of [name] phenomena in her fingers but no one has ever formally diagnosed with connective tissu  e disease.  OV 11/28/2016  Chief Complaint  Patient presents with  . Follow-up    Pt here after PFT. Pt states her breathing is uchanged since last OV. Pt c/o dry cough.- pt states this has slightly improved since last OV. Pt denies CP/tightness and f/c/s.      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 Since then she has seen rheumatologis Dr. Keturah Barre. I reviewd the notes and also discussed with the patient wa a good understading of what s going on. She tells me that some of the lupus antibodies have been positive correlating with Raynaud. Patient is on beta blocker for atrial fbrillation and it is the preference by the rheumatologist Dr. D that she switc to calcium channel blocker. She has a follow-up appointment pending with her electrophysiologist Dr. Caryl Comes.  She is scleroderma antibody positive but according to her rheumtolgist she does not have dermatologic  Manifestation of the disese. 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 She pulm  function test today and this is stable. In terms of arrest or symptoms she stable. The cough is actually improved. But she does get dyspneic walking up stairs especially a few flights. She does not wnt her to pulmonary habilitation because se exrcises on the treadmill although she does not monitor her saturations.   reports that she quit smoking about 19 years ago. She has a 42.00 pack-year smoking history. she has never used smokeless tobacco.   OV 06/12/2017  Chief Complaint  Patient presents with  . Follow-up    Pt states that she has been doing good since last visit. States that she has a "tickle cough" that is sporadic and has SOB that she states is when she climbs 2 flights of stairs. Denies any CP.    Follow-up combined emphysema was interstitial lung disease [autoimmune undifferentiated connective tissue disease interstitial lung disease]. Autoimmune features from May 2018 rheumatology noted with Dr. Keturah Barre: Raynaud phenomenon positive without gangrene, positive anti-cardiolipin and positive IgM and SCL-70 positive without clinical features of  scleroderma    Last seen June 2018. Since then she's stable. Overall she tells me that only problem is a tickle in her throat and slight cough. This is up and down depending on the pollen exposure she gets. She gets dyspneic for climbing few to several flights of stairs. This is unchanged. She did have full function test today and that shows mild significant worsening though overall gradient is only mild.. However she's not feeling this. She has appointment with Dr. Keturah Barre  pending    OV 07/18/2017  Chief Complaint  Patient presents with  . Follow-up    HRCT done 07/03/17.  Pt states she is the same as she was at last visit. SOB with exertion.    Follow-up combined emphysema was interstitial lung disease [autoimmune undifferentiated connective tissue disease interstitial lung disease]. Autoimmune features from May 2018 rheumatology noted with Dr. Keturah Barre: Raynaud phenomenon positive without gangrene, positive anti-cardiolipin and positive IgM and SCL-70 positive without clinical features of scleroderma    KADIN BERA presents for follow-up.  She is here to discuss the results of a high-resolution CT scan of the chest.  The scan was reviewed by Dr. Rosario Jacks thoracic radiology who feels that patient has probable UIP pattern that is definitely progressive compared to August 2017 although there is no comment about progression since June 2018 CT scan.  The pulmonary nodules itself are stable since August 2017 and are likely benign.  The change in the CT scan which is mild progression pulmonary fibrosis corresponds with the pulmonary function test that shows mild progression.  She is now here with her daughter Junie Panning and her husband.  She also states that 1 of her other daughters has rheumatoid arthritis and is on TNF alpha blockade.  All her 3 daughters have hemo-chromatosis    OV 08/29/2017  Chief Complaint  Patient presents with  . Follow-up    Pt states she has been doing good since last visit. Pt  recently went to [place] and had some mild problems with SOB due to heat.  Pt was prescribed cellcept and bactrim but has not yet started meds due to questions with other meds she is taking.    79 year old female with ILD secondary to autoimmune disease clinically suspicious of scleroderma but also previous history of hemochromatosis with family history of hemochromatosis.  She is here with her husband.  This visit is only a discussion visit.  Because she has many questions about starting CellCept before she actually does.  We did hepatitis virus panel, QuantiFERON gold, G6PD and all this is normal.  Lab work is normal.  I checked with [facility] pharmacist about interactions and was cleared for her to start CellCept.  The only recommendation was for patient to come off Prilosec because of reduced effect of CellCept.  Patient questions revolved around CellCept: She does not want to come off PPI because of hiatal hernia and has had bad acid reflux.  So  I would not Willis Modena asking for any alternative PPI.  In addition she states she takes multiple multivitamins including Biotene, vitamin D3, vitamin D, vitamin E93 and folic acid.  She also takes Allegra occasionally.  She wants to make sure it is all okay with CellCept.  She has not had a flu shot today and she is asking if she should have it.  She has never had flu shot before.  She is also wondering about anticoagulation in the setting of CellCept and thyroid issues in the setting of Bactrim.  In terms of her genetics: Initially I corresponded with our local geneticist who thought she might be better served at [facility] but the [facility] genetics person wrote to me saying that patient should first be seen by [facility] rheumatology and hematology.  Patient's not so sure she wants to see the subspecialist.  She will speak to Dr. Beryle Beams her local hematologist to see if it is worthwhile seeing the hematology department at [facility].   Lungs/Pleura: Moderate centrilobular emphysema. Mild basilar predominant subpleural reticulation and ground-glass, increased from 02/16/2016. No traction bronchiectasis/ bronchiolectasis, architectural distortion or honeycombing. Side-by-side nodules in the medial left lower lobe measure up to 6 mm (series 3, image 95), unchanged from 02/16/2016 and considered benign. No air trapping. No pleural fluid. Airway is unremarkable.  IMPRESSION: 1. Mild progression in basilar predominant subpleural fibrosis from 02/16/2016, raising suspicion for usual interstitial pneumonitis. 2. Aortic atherosclerosis (ICD10-170.0). Moderate coronary artery calcification. 3.  Emphysema (ICD10-J43.9).   Electronically Signed   By: Lorin Picket M.D.   On: 07/03/2017 09:54    OV 09/19/2017  Chief Complaint  Patient presents with  . Follow-up    Pt states she has been doing good. Currently on cellcept and bactrim and has been doing well on it once put back on omeprazole. Pt still has the cough and sometimes when trying to take a deep breath can't fully get all O2 out.   Delrose Rohwer returns for follow-up.  This is for autoimmune interstitial lung disease.  She was started on CellCept recently.  She is here to follow-up for therapeutic drug monitoring.  Because she was on omeprazole and we were initiating CellCept with Bactrim we asked her to hold off on omeprazole because the omeprazole impairs drug levels of CellCept.  However she tried it for a week and her acid reflux was significant despite ranitidine so she went back on omeprazole.  Pharmacist Willis Modena is advising changing the CellCept to equivaekbtdose of Myfortic. So far tolerating cellcept/pred/bactrimm well. Has seen dR G in heme and on observation. Had PFT today on cellcept/pred/bactrim and is better! Continue siwht spriva  OV 10/31/2017  . Chief Complaint  Patient presents with  . Follow-up    Pt currently taking  myfortic and states she has been well on that. Pt states she has some mild nausea and has some problems with sleeping at night. SOB is stable,mild coughing. Denies any CP.    Follow-up progressive interstitial lung disease with SCL-70 antibody positive and Raynard Follow-up associated emphysema.  Last visit September 19, 2017.  At that time she had intolerance to the CellCept therefore we switched her to Myfortic.  She is here to report for follow-up about this.  She is tolerating Myfortic just well.  She had some transient insomnia for a few days last week but this resolved.  She only has mild intermittent occasional nausea with the Myfortic but otherwise is tolerating it really well at  the low-dose of 360 mg twice daily.  She continues on prednisone 10 mg daily and Bactrim 3 times a week.  She is due for blood work today.  There are no other new issues.  She continues on Spiriva for her emphysema   OV 02/01/2018  Chief Complaint  Patient presents with  . Follow-up    PFT performed today.  Pt stated other than having shingles earlier, things have been doing good for her. Pt states SOB is stable and states she still has an occ cough.    Follow-up progressive interstitial lung disease with SCL-70 antibody positive and Raynud - IPAF Follow-up associated emphysema   VALINA MAES presents for routine follow-up with interstitial lung disease clinic. She has the above problems. In the interim she tells me she had developed shingles on the right neck but this is resolved. This is despite having zoster vaccine in 2012. She was yet to have the bnnew  shingles vaccine.in any event currently she is feeling well. She only has occasional post residual neuropathic pain. In terms of her shortness of breath it is stable and minimal. Walking desaturation test shows stability. Pulmicort function test below show stability. She is tolerating the mycophenolate myfortic well at 360 mg twice daily and prednisone 10 mg  daily and Bactrim for PCP prophylaxis. Overall she feels stable and feels that it is best medicines on touch in terms of dosing change  OV 08/14/2018  Subjective:  Patient ID: [name], female , DOB: 12-Aug-1940 , age 35 y.o. , MRN: [id number] , ADDRESS: [place] [id number]   08/14/2018 -   Chief Complaint  Patient presents with  . Follow-up    PFT performed today.  Pt states she is slowly getting better after her recent pna. States she still has some issues with SOB and an occ cough. Pt also states that she feels like she has a weight on her chest at times.    Follow-up progressive interstitial lung disease with SCL-70 antibody positive and Raynud - IPAF - on myfortic since march 2019 with pred 10 and bactrim  Follow-up associated emphysema - on spiriva    HPIfortic JAMYRIA OZANICH 79 y.o. -returns for 90-monthfollow-up of her interstitial lung disease associated with emphysema.  She presents with her middle daughter EJunie Panningwho is met me before.  She continues on Myfortic and prednisone 10 mg and Bactrim prophylaxis.  She continues on Spiriva.  She had an episode of atrial fibrillation according to review of the chart and my on history with in the middle of January 2020.  At this time she had a chest x-ray that showed asymptomatic upper lobe pneumonia.  I personally visualized the chest x-ray and confirmed the findings.  Antibiotic was not prescribed.  Then I follow this up with a CT scan of the chest [we canceled her elective colonoscopy].  CT chest showed improved pneumonia findings.  I prescribed even though she was feeling well cephalexin for a week.  She says she took it.  She continues to feel well except she has baseline amount of symptoms that is documented below.  Of note the high-resolution CT chest showed an improvement in ILD compared to a year ago.  In fact her pulmonary function tests are also showing stability/improvement as documented below.  She is  very happy about this outcome with immunosuppression .  However, she is a little bit unsure why she is symptomatic in terms of shortness of breath  and cough.  She is very concerned that her fibrosis is getting worse.  She wants to have closer follow-up than every 6 months.   IMPRESSION: HRCT Jan 2020 1. Vaguely bandlike patchy consolidation and ground-glass opacity at the periphery of the right upper and superior segment right lower lobes, new since 07/03/2017 chest CT, improving compared to 07/10/2018 chest radiograph, favoring resolving pneumonia with evolving postinfectious scarring. 2. Previously described basilar predominant subpleural reticulation and ground-glass attenuation is minimal and appears decreased in the interval. No bronchiectasis or honeycombing. Findings may represent an interstitial lung disease such as nonspecific interstitial pneumonia (NSIP) or less likely early usual interstitial pneumonia (UIP). Findings are indeterminate for UIP per consensus guidelines: Diagnosis of Idiopathic Pulmonary Fibrosis: An Official ATS/ERS/JRS/ALAT Clinical Practice Guideline. [place], Iss 5, [phone], Feb 25 2017. 3. Moderate centrilobular emphysema with mild diffuse bronchial wall thickening, suggesting COPD. 4. Two-vessel coronary atherosclerosis. 5. Small hiatal hernia.  Aortic Atherosclerosis (ICD10-I70.0) and Emphysema (ICD10-J43.9). ROS - per HPI    OV 04/22/2019  Subjective:  Patient ID: [name], female , DOB: [birth year] , age 47 y.o. , MRN: [id number] , ADDRESS: [place] [id number]   Follow-up progressive interstitial lung disease with SCL-70 antibody positive and Raynud - IPAF - on myfortic since march 2019 with pred 5 and bactrim 3x/week  Follow-up associated emphysema - on spiriva   04/22/2019 -   Chief Complaint  Patient presents with  . Follow-up    Patient reports that her breathing is doing well  at this time.      HPI ZELL DOUCETTE 79 y.o. -presents for follow-up.  Last seen by myself in early part of 2020 before the pandemic.  At that time CT chest suggested possible progression.  She saw a nurse practitioner in the interim after the onset of the pandemic.  She was anxious about the progression of her ILD so she asked for a CT scan in less than 1 year.  A CT scan of the chest was done in September 2020.  This is deemed stable since early part of the year but with possible progression versus stability since January 2019.  In terms of her symptoms she continues to be stable with very minimal shortness of breath and cough.  Her pulmonary function test below also shows stability.  Her walking desaturation test also shows stability.  She is tolerating her Myfortic, prednisone and 3 times a week of Bactrim just fine.  There are no side effects.  Last blood work was in August 2020 and this was reviewed.  We discussed the possibility of switching to antifibrotic nintedanib instead of taking immunosuppressants.  The rational for this would be it is a safer drug with less long-term side effect such as lack of immunosuppression.  However she feels her current regimen is working well for her.  She also feels that she would benefit more from a immunosuppressive drug because of her autoimmune antibodies and therefore she is more inclined to continue with her current immunosuppressive regimen of prednisone 5 mg/day and Myfortic and Bactrim 3 times a week.        OV 07/04/2019  Subjective:  Patient ID: [name], female , DOB: Jan 12, 1941 , age 61 y.o. , MRN: [id number] , ADDRESS: [place] [id number]   Follow-up mildy progressive - stable interstitial lung disease with SCL-70 antibody positive and Raynud - IPAF - on myfortic since march 2019  with pred 5 and bactrim 3x/week  Follow-up associated emphysema  Follow-up Raynard  Has associated hemochromatosis  Has  associated atrial fibrillation on Xarelto   07/04/2019 -   Chief Complaint  Patient presents with  . Follow-up    Pt states she has been doing well since last visit and denies any complaints.     HPI CHERITA HEBEL 79 y.o. -last visit was in October 2020.  At that time she was doing well.  Given her continued immunosuppression with prednisone and Myfortic we discussed the possibility of switching the Myfortic to the nintedanib but understanding that there uncertainties whether this is superior approach and if she could end up with temporary side effects of the nintedanib.  Based on that she decided to continue with prednisone, Myfortic and Bactrim as she was doing because things are working well.  However she later called in and wanted this change to nintedanib.  We did this change to nintedanib but then her co-pay ended up being over 500+ dollars and then she started also getting worried about the side effects with nintedanib particularly GI.  She is also on Xarelto for atrial fibrillation.  Therefore she made this visit ahead of schedule to discuss the possibility of making the switch.  I understand from her that it will be 500+ dollars every month with nintedanib as a co-pay.  In addition she is worried about the superiority of taking nintedanib.  She fully understands that Myfortic over length of time can carry cancer risk and immunosuppression risk but at this point in time she feels well and she is tolerating it well.  She also understands that nintedanib can carry temporary GI side effects.  She also understands that it may not be a superior approach.  She understands it could be an equivalent approach and definitely lower immunosuppression.  Her main systemic immunosuppressive features are lung disease and Raynaud.  She is worried this might flareup.  In terms of her symptoms there is stable as documented below.  Her walking desaturation test is also stable.   Last pulmonary function test  was in August r 2020 Last CT scan of the chest was in September 2020 Last blood work October 2020 Last echocardiogram 2012.  ROS - per HPI  OV 10/22/2019  Subjective:  Patient ID: [name], female , DOB: [birth year] , age 21 y.o. , MRN: [id number] , ADDRESS: [place] [id number]   Follow-up mildy progressive - stable interstitial lung disease with SCL-70 antibody positive and Raynud - IPAF - on myfortic since march 2019 with pred 5 and bactrim 3x/week  Follow-up associated emphysema  Follow-up Raynaud  Has associated hemochromatosis  Has associated atrial fibrillation on Xarelto    10/22/2019 -   Chief Complaint  Patient presents with  . Follow-up    PFT performed today. Pt states she has been doing good since last visit. Denies any complaints with breathing.     HPI JAHNAI SLINGERLAND 79 y.o. -returns for routine follow-up.  Overall doing well.  She feels shortness of breath is stable.  She feels she is tolerating Myfortic and prednisone fine no new interim issues she has had a Covid vaccine.  She continues on high risk immunosuppressive therapy.  There is some mild diarrhea with the Myfortic but it is tolerable.  The shortness of breath scale is stable walking desaturation test is stable.  Her pulmonary function test shows stable FVC with a possible reduction in diffusion capacity.  I do not know what to make of this at this point in time.  It could be a technical issue because she is feeling stable.  She did have echocardiogram that showed grade 2 diastolic dysfunction.   She continues on her Spiriva for associated emphysema.  ROS - per HPI     OV 03/03/2020   Subjective:  Patient ID: [name], female , DOB: 04-Feb-1941, age 58 y.o. years. , MRN: [id number],  ADDRESS: [address] [place] [address] PCP  Kathyrn Lass, MD Providers : Treatment Team:  Attending Provider: Brand Males, MD   Chief Complaint  Patient  presents with  . Follow-up    f/u pft     Follow-up mildy progressive - stable interstitial lung disease with SCL-70 antibody positive and Raynud - IPAF - on myfortic since march 2019 with pred 5 and bactrim 3x/week  Follow-up associated emphysema  Follow-up Raynaud  Has associated hemochromatosis  Has associated atrial fibrillation on Xarelto  Mild associated diastolic dysfunction and echo January 2021    HPI ARMENTHA BRANAGAN 79 y.o. -returns for follow-up.  Overall she is doing well.  However in the last 1-2 months she says she is slightly more short of breath than usual.  She is also having slight increase in dry cough.  Based on her subjective symptom questionnaires it seems the cough itself is stable but she is definitely saying it is worse.  She is seeing the dyspnea is overall stable compared to the cough but looking at the symptom questionnaire is maybe it is a little worse.  Is hard to discern.  It has been approximately 1 year since she had a high-resolution CT chest.  She is wondering if she can have this repeated.  She is on immunosuppression.  She is also noticing -that at home her pulse oximetry on her finger varies a lot [she does have [name]]) and sometimes can be as low as 88% - 93%.  She does acknowledge that the circulation in her hands is not as good.  Pulse ox here at rest was normal.  She had pulmonary function test that shows stability.-With the FVC although I am not sure what to make of the DLCO.  She has had a Covid booster vaccine  Last echocardiogram January 2021 with mild diastolic dysfunction  SYMPTOM SCALE - ILD 08/14/2018  04/22/2019  07/04/2019  10/22/2019  03/03/2020 139#  O2 use *RA      Shortness of Breath 0 -> 5 scale with 5 being worst (score 6 If unable to do)      At rest 1 0 0 0.5 1  Simple tasks - showers, clothes change, eating, shaving 1.5 0 .5 0.5 1  Household (dishes, doing bed, laundry) 1.5 1 0.5 0.5 1  Shopping 1.5 0 0/5 0.5 1   Walking keeping up with others of same age 6 0 0.5 0.5 1.5  Walking up Stairs 2.5 1 0.5 0/5 1.5  Total (40 - 48) Dyspnea Score 10- 2 2.$Remov'5 3 7  'ktkHkI$ How bad is your cough? 1.5 1.5 0.5 1 1.5  How bad is your fatigue 1.$RemoveBefore'5 1 1 2 'MaaAXpCskExBM$ 1.5  nausea    0 0  vomit    0 0  dairrhea    2.5 0  anxiety    0 0  deoresoj    0 0    Simple office walk 185 feet x  3 laps goal with forehead probe 07/04/2019  10/22/2019  03/03/2020   O2  used ra ra ra   Number laps completed 3 3   Comments about pace avg    Resting Pulse Ox/HR 98% and 74/min 100% and 66   Final Pulse Ox/HR 98% and 87/min 97% and 81   Desaturated </= 88% no no   Desaturated <= 3% points no no   Got Tachycardic >/= 90/min no no   Symptoms at end of test Mild dyspnea Very mild dyspnea   Miscellaneous comments x        PFT Results Latest Ref Rng & Units 02/28/2020 10/22/2019 02/18/2019 08/14/2018 02/01/2018 09/19/2017 06/12/2017  FVC-Pre L 2.90 2.83 2.82 2.92 2.87 3.04 2.68  FVC-Predicted Pre % 108 106 104 108 105 111 97  FVC-Post L - - - - - - -  FVC-Predicted Post % - - - - - - -  Pre FEV1/FVC % % 66 69 67 67 68 66 65  Post FEV1/FCV % % - - - - - - -  FEV1-Pre L 1.92 1.96 1.89 1.95 1.95 2.02 1.74  FEV1-Predicted Pre % 96 98 93 96 96 98 84  FEV1-Post L - - - - - - -  DLCO uncorrected ml/min/mmHg 11.88 12.46 15.31 13.19 13.07 13.83 12.17  DLCO UNC% % 63 66 81 70 54 57 50  DLCO corrected ml/min/mmHg 11.88 12.46 - - - 14.29 13.00  DLCO COR %Predicted % 63 66 - - - 59 54  DLVA Predicted % 66 73 79 68 60 61 61  TLC L - - - - - - -  TLC % Predicted % - - - - - - -  RV % Predicted % - - - - - - -      IMPRESSION: 1. Very mild basilar predominant subpleural reticulation and ground-glass, similar to minimally progressive from 07/03/2017. Nonspecific interstitial pneumonitis is favored. Findings are indeterminate for UIP per consensus guidelines: Diagnosis of Idiopathic Pulmonary Fibrosis: An Official ATS/ERS/JRS/ALAT Clinical Practice Guideline.  [place], Iss 5, [phone], Feb 25 2017. 2. Aortic atherosclerosis (ICD10-170.0). Coronary artery calcification. 3. Enlarged pulmonic trunk, indicative of pulmonary arterial hypertension. 4.  Emphysema (ICD10-J43.9).   Electronically Signed   By: Lorin Picket M.D.   On: 03/20/2019 14:47  Echocardiogram -January 2021: Shows grade 2 diastolic dysfunction but otherwise unremarkable.   ROS - per HPI     has a past medical history of Atrial fibrillation ([place]), Chest pain, Depression with anxiety, GERD (gastroesophageal reflux disease), Hemochromatosis, History of syncope, and Hypothyroidism.   reports that she quit smoking about 22 years ago. She has a 42.00 pack-year smoking history. She has never used smokeless tobacco.  Past Surgical History:  Procedure Laterality Date  . MOUTH SURGERY    . SHOULDER ARTHROSCOPY Left   . TOTAL ABDOMINAL HYSTERECTOMY    . VARICOSE VEIN SURGERY      No Known Allergies  Immunization History  Administered Date(s) Administered  . Fluad Quad(high Dose 65+) 02/18/2019  . Influenza, High Dose Seasonal PF 08/29/2017, 04/26/2018  . PFIZER SARS-COV-2 Vaccination 07/22/2019, 08/12/2019  . Pneumococcal Conjugate-13 06/12/2014  . Pneumococcal Polysaccharide-23 06/27/2010  . Tdap 03/16/2011  . Zoster 06/27/2010  . Zoster Recombinat (Shingrix) 04/08/2019    Family History  Problem Relation Age of Onset  . Stroke Mother   . Heart disease Father 76  . Hemochromatosis Brother      Current Outpatient Medications:  .  acetaminophen (TYLENOL) 325 MG tablet, Take 650 mg by mouth every 6 (six) hours as  needed for mild pain or headache., Disp: , Rfl:  .  apixaban (ELIQUIS) 5 MG TABS tablet, Take 1 tablet (5 mg total) by mouth 2 (two) times daily., Disp: 180 tablet, Rfl: 3 .  atorvastatin (LIPITOR) 10 MG tablet, Take 10 mg by mouth daily., Disp: , Rfl:  .  Cyanocobalamin (VITAMIN B 12 PO), Take 1,000 mcg by mouth daily.,  Disp: , Rfl:  .  flecainide (TAMBOCOR) 100 MG tablet, Take 1 tablet (100 mg total) by mouth 2 (two) times daily., Disp: 180 tablet, Rfl: 0 .  folic acid (FOLVITE) 1 MG tablet, Take 1 mg by mouth daily.  , Disp: , Rfl:  .  levothyroxine (SYNTHROID, LEVOTHROID) 112 MCG tablet, Take 112 mcg by mouth daily.  , Disp: , Rfl:  .  methocarbamol (ROBAXIN) 500 MG tablet, Take 1 tablet (500 mg total) by mouth daily as needed., Disp: 30 tablet, Rfl: 0 .  metoprolol succinate (TOPROL-XL) 25 MG 24 hr tablet, TAKE 1 TABLET BY MOUTH  DAILY (Patient taking differently: Take 25 mg by mouth daily. ), Disp: 90 tablet, Rfl: 2 .  mycophenolate (MYFORTIC) 360 MG TBEC EC tablet, TAKE 1 TABLET BY MOUTH TWICE DAILY (Patient taking differently: Take 360 mg by mouth 2 (two) times daily. ), Disp: 180 tablet, Rfl: 3 .  omeprazole (PRILOSEC) 40 MG capsule, Take 40 mg by mouth daily., Disp: , Rfl:  .  ondansetron (ZOFRAN ODT) 4 MG disintegrating tablet, Take 1-2 tablets three times daily as needed, Disp: 42 tablet, Rfl: 1 .  predniSONE (DELTASONE) 5 MG tablet, Take 1 tablet (5 mg total) by mouth daily with breakfast., Disp: 90 tablet, Rfl: 3 .  SPIRIVA RESPIMAT 2.5 MCG/ACT AERS, INHALE 2 PUFFS INTO THE LUNGS DAILY (Patient taking differently: Inhale 2 puffs into the lungs daily. ), Disp: 4 g, Rfl: 11 .  sulfamethoxazole-trimethoprim (BACTRIM DS) 800-160 MG tablet, TAKE 1 TABLET BY MOUTH ON MONDAY, WEDNESDAY AND FRIDAY, Disp: 90 tablet, Rfl: 3 .  VITAMIN D PO, Take 5,000 Units by mouth 4 (four) times a week., Disp: , Rfl:       Objective:   Vitals:   03/03/20 1017  BP: 110/60  Pulse: 67  Temp: (!) 96.2 F (35.7 C)  TempSrc: Temporal  SpO2: 99%  Weight: 139 lb 9.6 oz (63.3 kg)  Height: 5' 3.5" (1.613 m)    Estimated body mass index is 24.34 kg/m as calculated from the following:   Height as of this encounter: 5' 3.5" (1.613 m).   Weight as of this encounter: 139 lb 9.6 oz (63.3 kg).  $Rem'@WEIGHTCHANGE'yvoY$ @  [employer]    03/03/20 1017  Weight: 139 lb 9.6 oz (63.3 kg)     Physical Exam Pleasant well-built female nonfocal exam.  No crackles no loud P2.  Has chronic venous insufficiency in her hands.  No elevated JVP no neck nodes.  No stigmata of connective tissue disease that is overt except for mild [name].  Normal heart sounds normal lung sounds.  Abdomen soft no sinus no clubbing no edema        Assessment:       ICD-10-CM   1. ILD (interstitial lung disease) ([place])  J84.9   2. High risk medication use  Z79.899   3. Therapeutic drug monitoring  Z51.81   4. 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 [name] phenomenon without gangrene  I73.00   6. Centrilobular emphysema ([place])  J43.2    Has worsening dyspnea.  Need to rule out  worsening ILD.  Has worsening cough need to rule out worsening ILD versus opportunistic infection.  All this could just also be physical deconditioning and diastolic dysfunction.    Plan:     Patient Instructions     ICD-10-CM   1. ILD (interstitial lung disease) ([place])  J84.9   2. High risk medication use  Z79.899   3. Therapeutic drug monitoring  Z51.81   4. 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 [name] phenomenon without gangrene  I73.00   6. Centrilobular emphysema (HCC)  J43.2       Interstitial pulmonary disease (HCC) Scl-70 antibody positive [name] phenomenon without gangrene High risk medication use Therapeutic drug monitoring  - YOu have increased cough - - Need to ensure ILD is stable. Need to ensure increased cough is not from worsened ILD or opportunistic infection  Plan  - DO HRCT supine and prone sometime next few weeks    - might need bronchoscopy based on results  - cotninue  prednisone to 5 mg per day +  myfortic and bactrim at current dose -  Hold off on ofev given fact things are working well currently  - do cbc, bmet, LFT 03/03/2020  -  please have high-dose flu shot some 1-2 weeks after Covid vaccine   Pulmonary emphysema,  unspecified emphysema type ([place]) -  Continue spiriva  Followup 4-6 months do spirometry and dlco Return to 30  Min slot in 4-6 months but after spirometry; symptioms screen and walk test at followui     SIGNATURE    Dr. Brand Males, M.D., F.C.C.P,  Pulmonary and Critical Care Medicine Staff Physician, [place] Director - Interstitial Lung Disease  Program  Pulmonary [place] [facility] [place], [place], [address]  Pager: [phone], If no answer or between  15:00h - 7:00h: call [phone] Telephone: [phone]  10:40 AM 03/03/2020

## 2020-03-11 ENCOUNTER — Other Ambulatory Visit: Payer: Self-pay | Admitting: Internal Medicine

## 2020-03-16 ENCOUNTER — Ambulatory Visit
Admission: RE | Admit: 2020-03-16 | Discharge: 2020-03-16 | Disposition: A | Discharge status: Home / Self Care | Payer: Medicare Other | Source: Ambulatory Visit | Attending: Internal Medicine | Admitting: Internal Medicine

## 2020-03-16 ENCOUNTER — Other Ambulatory Visit: Payer: Self-pay

## 2020-03-16 DIAGNOSIS — J849 Interstitial pulmonary disease, unspecified: Secondary | ICD-10-CM

## 2020-05-21 ENCOUNTER — Telehealth: Payer: Self-pay | Admitting: Internal Medicine

## 2020-05-21 NOTE — Telephone Encounter (Signed)
  ILD stable. Nothing to suggest she needs bronch. If cough stable then no need for bronch to rule out opportunistic infectins. Will see her per plan  Please apologize in delay in getting back    IMPRESSION: 1. No significant interval change in subtle irregular peripheral interstitial opacity predominantly seen in the bilateral lung bases, although also noted in nondependent portions of the right middle lobe and lingula. There is no appreciable change in these findings on multiple prior examinations dating back to 2017. Bland post infectious or inflammatory scarring is favored, although minimal fibrotic interstitial lung disease is not excluded and if characterized by ATS pulmonary fibrosis criteria, findings are consistent with an early "indeterminate for UIP" pattern. Consider ongoing follow-up CT to assess for stability of fibrotic findings. Findings are indeterminate for UIP per consensus guidelines: Diagnosis of Idiopathic Pulmonary Fibrosis: An Official ATS/ERS/JRS/ALAT Clinical Practice Guideline. [place], Iss 5, [phone], Feb 25 2017. 2. Emphysema (ICD10-J43.9). 3. Coronary artery disease.  Aortic Atherosclerosis (ICD10-I70.0).   Electronically Signed   By: Eddie Candle M.D.   On: 03/16/2020 14:12

## 2020-05-22 NOTE — Telephone Encounter (Signed)
Called and spoke with pt letting her know the results of CT and info stated by MR and she verbalized understanding. Nothing further needed.

## 2020-05-26 NOTE — Progress Notes (Signed)
Office Visit Note  Patient: [name]             Date of Birth: 1941/03/26           MRN: [id number]             PCP: Kathyrn Lass, MD Referring: Kathyrn Lass, MD Visit Date: 06/09/2020 Occupation: @GUAROCC @  Subjective:  Other (Right sided rib pain and back pain- onset approximately 1 week ago when she leaned over the arm rest in her car and heard a "popping" noise )   History of Present Illness: [name] is a 79 y.o. female with history of interstitial lung disease.  She states her lung disease have been stable and she is been followed by Dr. Chase Caller.  She states about a week ago she reached out to the passenger side in her car with extended arm.  She felt a pop in her right rib cage.  Is been painful since then.  She also has some upper and lower back pain.  The Raynauds appears to be active.  She continues to have some discomfort in her hands and trochanteric bursa .  Activities of Daily Living:  Patient reports morning stiffness for 5  minutes.   Patient Reports nocturnal pain.  Difficulty dressing/grooming: Denies Difficulty climbing stairs: Denies Difficulty getting out of chair: Denies Difficulty using hands for taps, buttons, cutlery, and/or writing: Denies  Review of Systems  Constitutional: Negative for fatigue.  HENT: Positive for mouth dryness. Negative for mouth sores and nose dryness.   Eyes: Positive for itching. Negative for pain and dryness.  Respiratory: Negative for shortness of breath and difficulty breathing.   Cardiovascular: Negative for chest pain and palpitations.  Gastrointestinal: Negative for blood in stool, constipation and diarrhea.  Endocrine: Negative for increased urination.  Genitourinary: Negative for difficulty urinating.  Musculoskeletal: Positive for arthralgias, joint pain, myalgias, morning stiffness, muscle tenderness and myalgias. Negative for joint swelling.  Skin: Positive for color change. Negative for rash and  redness.  Allergic/Immunologic: Negative for susceptible to infections.  Neurological: Negative for dizziness, numbness, headaches, memory loss and weakness.  Hematological: Positive for bruising/bleeding tendency.  Psychiatric/Behavioral: Negative for confusion.    PMFS History:  Patient Active Problem List   Diagnosis Date Noted  . High risk medication use 02/18/2019  . Therapeutic drug monitoring 02/18/2019  . Healthcare maintenance 02/18/2019  . ILD (interstitial lung disease) ([place]) 11/28/2016  . Multiple lung nodules on CT 11/28/2016  . Abnormal laboratory test 11/18/2016  . DDD L spine 11/18/2016  . 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 History of hypothyroidism 11/10/2016  . History of hemochromatosis 11/10/2016  . Pulmonary emphysema ([place]) 08/31/2016  . [name] phenomenon without gangrene 08/31/2016  . Sinusitis, chronic 08/31/2016  . Chronic cough 02/10/2016  . Irritable larynx 02/10/2016  . Stopped smoking with greater than 40 pack year history 02/10/2016  . Chest pain, unspecified 10/06/2010  . Shortness of breath 10/06/2010  . Hypothyroidism   . Hereditary hemochromatosis ([place]) 04/22/2010  . ATRIAL FIBRILLATION 10/20/2008  . [facility] 10/20/2008  . SYNCOPE AND COLLAPSE 10/20/2008    Past Medical History:  Diagnosis Date  . Atrial fibrillation ([place])    a. Flecainide therapy;  b. event monitor 4/12  . Chest pain    a. GXT myoview 4/12: no isch., EF 86%;   b. echo 4/12: EF 55-65%, grade 1 diast dysfxn, LAE  . Depression with anxiety   . GERD (gastroesophageal reflux disease)   . Hemochromatosis  indentified by the C282Y gene mutation; Dr. Beryle Beams  . History of syncope   . Hypothyroidism     Family History  Problem Relation Age of Onset  . Stroke Mother   . Heart disease Father 34  . Hemochromatosis Brother    Past Surgical History:  Procedure Laterality Date  . MOUTH SURGERY    . SHOULDER ARTHROSCOPY Left   . TOTAL ABDOMINAL HYSTERECTOMY    .  VARICOSE VEIN SURGERY     Social History   Social History Narrative   REGULAR EXERCISE   Immunization History  Administered Date(s) Administered  . Fluad Quad(high Dose 65+) 02/18/2019  . Influenza, High Dose Seasonal PF 08/29/2017, 04/26/2018  . PFIZER SARS-COV-2 Vaccination 07/22/2019, 08/12/2019, 03/11/2020  . Pneumococcal Conjugate-13 06/12/2014  . Pneumococcal Polysaccharide-23 06/27/2010  . Tdap 03/16/2011  . Zoster 06/27/2010  . Zoster Recombinat (Shingrix) 04/08/2019     Objective: Vital Signs: BP 131/72 (BP Location: Left Arm, Patient Position: Sitting, Cuff Size: Normal)   Pulse 60   Resp 14   Ht 5' 3.5" (1.613 m)   Wt 142 lb 6.4 oz (64.6 kg)   BMI 24.83 kg/m    Physical Exam Vitals and nursing note reviewed.  Constitutional:      Appearance: She is well-developed and well-nourished.  HENT:     Head: Normocephalic and atraumatic.  Eyes:     Extraocular Movements: EOM normal.     Conjunctiva/sclera: Conjunctivae normal.  Cardiovascular:     Rate and Rhythm: Normal rate and regular rhythm.     Pulses: Intact distal pulses.     Heart sounds: Normal heart sounds.  Pulmonary:     Effort: Pulmonary effort is normal.     Breath sounds: Normal breath sounds.  Abdominal:     General: Bowel sounds are normal.     Palpations: Abdomen is soft.  Musculoskeletal:     Cervical back: Normal range of motion.  Lymphadenopathy:     Cervical: No cervical adenopathy.  Skin:    General: Skin is warm and dry.     Capillary Refill: Capillary refill takes less than 2 seconds.     Comments: No sclerodactyly or telangiectasias were noted. She has mild nailbed capillary dilation.  Neurological:     Mental Status: She is alert and oriented to person, place, and time.  Psychiatric:        Mood and Affect: Mood and affect normal.        Behavior: Behavior normal.      Musculoskeletal Exam: C-spine was in good range of motion. She has some tenderness over the right rib cage.  There was no thoracic spine tenderness. Shoulder joints elbow joints and wrist joints with good range of motion. She had no synovitis over MCPs PIPs or DIPs. There was sclerodactyly was noted. Hip joints, knee joints, ankles with good range of motion with no synovitis. She has some discomfort range of motion for lumbar spine.  CDAI Exam: CDAI Score: -- Patient Global: --; Provider Global: -- Swollen: --; Tender: -- Joint Exam 06/09/2020   No joint exam has been documented for this visit   There is currently no information documented on the homunculus. Go to the Rheumatology activity and complete the homunculus joint exam.  Investigation: No additional findings.  Imaging: No results found.  Recent Labs: Lab Results  Component Value Date   WBC 6.8 03/03/2020   HGB 11.5 (L) 03/03/2020   PLT 216.0 03/03/2020   NA 141 03/03/2020   K 4.1  03/03/2020   CL 104 03/03/2020   CO2 29 03/03/2020   GLUCOSE 107 (H) 03/03/2020   BUN 11 03/03/2020   CREATININE 0.67 03/03/2020   BILITOT 0.4 03/03/2020   ALKPHOS 48 03/03/2020   AST 12 03/03/2020   ALT 11 03/03/2020   PROT 6.6 03/03/2020   ALBUMIN 4.3 03/03/2020   CALCIUM 9.2 03/03/2020   GFRAA >60 09/22/2018   QFTBGOLDPLUS NEGATIVE 08/10/2017    Speciality Comments: No specialty comments available.  Procedures:  No procedures performed Allergies: Patient has no known allergies.   Assessment / Plan:     Visit Diagnoses: Scleroderma (HCC)-she has no sclerodactyly. She has nailbed capillary changes. No telangiectasias was noted. She has anticardiolipin antibody positive. She also has ILD followed by Dr. Chase Caller.  Anticardiolipin antibody positive - Positive anticardiolipin IgM 47, positive beta-2 IgM 31  ILD (interstitial lung disease) ([place]) -  She is followed by Dr. Chase Caller.    High risk medication use - She is on Myfortic 360 mg twice daily, Bactrim three times a week, and prednisone 5 mg daily prescribed by Dr. Chase Caller. -  Plan: CBC with Differential/Platelet, COMPLETE METABOLIC PANEL WITH GFR today. She is supposed to get labs with Dr. Chase Caller every 3 months.  Multiple lung nodules on CT - Most recent HRCT was on 03/16/20.  History of emphysema  Rib pain on right side-patient states she felt sudden pop on the right side of her ribs about 1 week ago when she was reaching to the passenger side of her car. She has been having pain and discomfort since then. I offered getting x-rays but she would prefer to see Dr. Ninfa Linden.  DDD (degenerative disc disease), lumbar-she has seen Dr. Ninfa Linden in the past. She states she has been having increased lower back pain with radiculopathy. She will see Dr. Ninfa Linden again.  Primary osteoarthritis of both hands-joint protection was discussed.  Trochanteric bursitis of both hips-she continues to have mild bursitis.  Osteoporosis -DEXA scan from Nov 04, 2019 T score -2.9 left femoral neck, BMD 0.532.  These findings are consistent with osteoporosis.  I will discuss treatment options at the follow-up visit.  Synovial cyst of left popliteal space  History of hypothyroidism  History of atrial fibrillation - she is on Xarelto.  History of hemochromatosis  Long term systemic steroid user-she has been on prednisone 5 mg p.o. daily.  Former smoker  Vitamin D deficiency - Plan: VITAMIN D 25 Hydroxy (Vit-D Deficiency, Fractures)  Orders: Orders Placed This Encounter  Procedures  . CBC with Differential/Platelet  . COMPLETE METABOLIC PANEL WITH GFR  . VITAMIN D 25 Hydroxy (Vit-D Deficiency, Fractures)   No orders of the defined types were placed in this encounter.     Follow-Up Instructions: Return in about 6 months (around 12/08/2020) for ILD.   Bo Merino, MD  Note - This record has been created using Editor, commissioning.  Chart creation errors have been sought, but may not always  have been located. Such creation errors do not reflect on  the standard of  medical care.

## 2020-05-27 ENCOUNTER — Other Ambulatory Visit: Payer: Self-pay | Admitting: *Deleted

## 2020-05-27 DIAGNOSIS — M791 Myalgia, unspecified site: Secondary | ICD-10-CM

## 2020-05-27 MED ORDER — METHOCARBAMOL 500 MG PO TABS: 500.0000 mg | ORAL_TABLET | Freq: Every day | ORAL | 0 refills | Status: DC | PRN

## 2020-05-27 NOTE — Telephone Encounter (Signed)
Refill request received via fax  Last Visit: 12/09/2019  Next Visit: 06/09/2020  Okay to refill Methocarbamol?

## 2020-05-29 ENCOUNTER — Ambulatory Visit: Payer: Medicare Other | Admitting: Internal Medicine

## 2020-06-04 ENCOUNTER — Other Ambulatory Visit: Payer: Self-pay | Admitting: Internal Medicine

## 2020-06-09 ENCOUNTER — Ambulatory Visit (INDEPENDENT_AMBULATORY_CARE_PROVIDER_SITE_OTHER): Payer: Medicare Other | Admitting: Rheumatology

## 2020-06-09 ENCOUNTER — Encounter: Payer: Self-pay | Admitting: Rheumatology

## 2020-06-09 ENCOUNTER — Other Ambulatory Visit: Payer: Self-pay

## 2020-06-09 VITALS — BP 131/72 | HR 60 | Resp 14 | Ht 63.5 in | Wt 142.4 lb

## 2020-06-09 DIAGNOSIS — Z7952 Long term (current) use of systemic steroids: Secondary | ICD-10-CM

## 2020-06-09 DIAGNOSIS — Z87891 Personal history of nicotine dependence: Secondary | ICD-10-CM

## 2020-06-09 DIAGNOSIS — J849 Interstitial pulmonary disease, unspecified: Secondary | ICD-10-CM | POA: Diagnosis not present

## 2020-06-09 DIAGNOSIS — M7122 Synovial cyst of popliteal space [Baker], left knee: Secondary | ICD-10-CM

## 2020-06-09 DIAGNOSIS — R760 Raised antibody titer: Secondary | ICD-10-CM | POA: Diagnosis not present

## 2020-06-09 DIAGNOSIS — Z8639 Personal history of other endocrine, nutritional and metabolic disease: Secondary | ICD-10-CM

## 2020-06-09 DIAGNOSIS — Z79899 Other long term (current) drug therapy: Secondary | ICD-10-CM | POA: Diagnosis not present

## 2020-06-09 DIAGNOSIS — M349 Systemic sclerosis, unspecified: Secondary | ICD-10-CM | POA: Diagnosis not present

## 2020-06-09 DIAGNOSIS — Z13820 Encounter for screening for osteoporosis: Secondary | ICD-10-CM

## 2020-06-09 DIAGNOSIS — M7061 Trochanteric bursitis, right hip: Secondary | ICD-10-CM

## 2020-06-09 DIAGNOSIS — M19041 Primary osteoarthritis, right hand: Secondary | ICD-10-CM

## 2020-06-09 DIAGNOSIS — R0781 Pleurodynia: Secondary | ICD-10-CM

## 2020-06-09 DIAGNOSIS — E559 Vitamin D deficiency, unspecified: Secondary | ICD-10-CM

## 2020-06-09 DIAGNOSIS — M7062 Trochanteric bursitis, left hip: Secondary | ICD-10-CM

## 2020-06-09 DIAGNOSIS — M5136 Other intervertebral disc degeneration, lumbar region: Secondary | ICD-10-CM

## 2020-06-09 DIAGNOSIS — R918 Other nonspecific abnormal finding of lung field: Secondary | ICD-10-CM

## 2020-06-09 DIAGNOSIS — I7300 Raynaud's syndrome without gangrene: Secondary | ICD-10-CM

## 2020-06-09 DIAGNOSIS — Z8709 Personal history of other diseases of the respiratory system: Secondary | ICD-10-CM

## 2020-06-09 DIAGNOSIS — Z8679 Personal history of other diseases of the circulatory system: Secondary | ICD-10-CM

## 2020-06-09 DIAGNOSIS — M810 Age-related osteoporosis without current pathological fracture: Secondary | ICD-10-CM

## 2020-06-09 DIAGNOSIS — M19042 Primary osteoarthritis, left hand: Secondary | ICD-10-CM

## 2020-06-10 ENCOUNTER — Encounter: Payer: Self-pay | Admitting: Family Medicine

## 2020-06-10 ENCOUNTER — Ambulatory Visit (INDEPENDENT_AMBULATORY_CARE_PROVIDER_SITE_OTHER): Payer: Medicare Other

## 2020-06-10 ENCOUNTER — Telehealth: Payer: Self-pay | Admitting: *Deleted

## 2020-06-10 ENCOUNTER — Ambulatory Visit (INDEPENDENT_AMBULATORY_CARE_PROVIDER_SITE_OTHER): Payer: Medicare Other | Admitting: Family Medicine

## 2020-06-10 ENCOUNTER — Other Ambulatory Visit: Payer: Self-pay

## 2020-06-10 DIAGNOSIS — R0781 Pleurodynia: Secondary | ICD-10-CM

## 2020-06-10 DIAGNOSIS — M545 Low back pain, unspecified: Secondary | ICD-10-CM | POA: Diagnosis not present

## 2020-06-10 LAB — CBC WITH DIFFERENTIAL/PLATELET
Absolute Monocytes: 340 cells/uL (ref 200–950)
Basophils Absolute: 55 cells/uL (ref 0–200)
Basophils Relative: 0.6 %
Eosinophils Absolute: 74 cells/uL (ref 15–500)
Eosinophils Relative: 0.8 %
HCT: 37.3 % (ref 35.0–45.0)
Hemoglobin: 12.4 g/dL (ref 11.7–15.5)
Lymphs Abs: 966 cells/uL (ref 850–3900)
MCH: 33.2 pg — ABNORMAL HIGH (ref 27.0–33.0)
MCHC: 33.2 g/dL (ref 32.0–36.0)
MCV: 100 fL (ref 80.0–100.0)
MPV: 12.6 fL — ABNORMAL HIGH (ref 7.5–12.5)
Monocytes Relative: 3.7 %
Neutro Abs: 7765 cells/uL (ref 1500–7800)
Neutrophils Relative %: 84.4 %
Platelets: 271 10*3/uL (ref 140–400)
RBC: 3.73 10*6/uL — ABNORMAL LOW (ref 3.80–5.10)
RDW: 13 % (ref 11.0–15.0)
Total Lymphocyte: 10.5 %
WBC: 9.2 10*3/uL (ref 3.8–10.8)

## 2020-06-10 LAB — COMPLETE METABOLIC PANEL WITH GFR
AG Ratio: 2 (calc) (ref 1.0–2.5)
ALT: 11 U/L (ref 6–29)
AST: 15 U/L (ref 10–35)
Albumin: 4.4 g/dL (ref 3.6–5.1)
Alkaline phosphatase (APISO): 51 U/L (ref 37–153)
BUN: 13 mg/dL (ref 7–25)
CO2: 28 mmol/L (ref 20–32)
Calcium: 9.8 mg/dL (ref 8.6–10.4)
Chloride: 102 mmol/L (ref 98–110)
Creat: 0.6 mg/dL (ref 0.60–0.93)
GFR, Est African American: 101 mL/min/{1.73_m2} (ref >=60)
GFR, Est Non African American: 87 mL/min/{1.73_m2} (ref >=60)
Globulin: 2.2 g/dL (calc) (ref 1.9–3.7)
Glucose, Bld: 99 mg/dL (ref 65–99)
Potassium: 4 mmol/L (ref 3.5–5.3)
Sodium: 138 mmol/L (ref 135–146)
Total Bilirubin: 0.5 mg/dL (ref 0.2–1.2)
Total Protein: 6.6 g/dL (ref 6.1–8.1)

## 2020-06-10 LAB — VITAMIN D 25 HYDROXY (VIT D DEFICIENCY, FRACTURES): Vit D, 25-Hydroxy: 43 ng/mL (ref 30–100)

## 2020-06-10 MED ORDER — METHOCARBAMOL 750 MG PO TABS: 750.0000 mg | ORAL_TABLET | Freq: Four times a day (QID) | ORAL | 3 refills | Status: DC | PRN

## 2020-06-10 NOTE — Progress Notes (Signed)
I saw and examined the patient with Dr. Elouise Munroe and agree with assessment and plan as outlined.    Right anterior rib pain after bending over a console.  Also low back pain.  Exam suspicious for rib fracture, although x-rays are equivocal.  Will treat with ACE bandage, robaxin.  Anticipate 6-12 weeks healing time.  Return as needed.

## 2020-06-10 NOTE — Telephone Encounter (Signed)
Received DEXA results from [facility].  Date of Scan: 11/04/2019 Lowest T-score and site measured: -2.9 Left Femoral Neck Significant changes in BMD and site measured (5% and above): n/a  Current Regimen: n/a  Recommendation: Per Dr. Estanislado Pandy we can discuss treatment options at the follow up visit in 6 months or we can schedule an earlier appointment to discuss treatment.   Attempted to contact the patient and left message for patient to call the office.

## 2020-06-10 NOTE — Progress Notes (Signed)
CBC, CMP and vitamin D are within normal limits.  She should continue to take vitamin D 2000 units daily to maintain vitamin D level.

## 2020-06-10 NOTE — Progress Notes (Signed)
Office Visit Note   Patient: [name]           Date of Birth: 05-08-1941           MRN: [id number] Visit Date: 06/10/2020 Requested by: Kathyrn Lass, [place],  [place] [id number] PCP: Kathyrn Lass, MD  Subjective: Chief Complaint  Patient presents with  . Lower Back - Pain    Started 2 days ago in the right lower back - not radiating down the leg this time. Has had sciatica in the past.   . right rib pain    Pain in right rib, under breast and under the scapula. Started 1 week ago, after leaning over console in the car - felt a pop.    HPI: 79yo F presenting to clinic with concerns of right anterior rib pain. Patient states that approximately one week ago, she was leaning over the armrest in her chair, fulcruming her weight over her ribs as she reached for something on the opposite side of the car. She felt a 'pop' in her anterior rib cage, just beneath her breast, accompanied by sudden pain. Pain is worsened with deep inhalation, or with certain movements. Since the initial injury, her pain has traveled along the entire length of the rib, and she's started to feel muscle spasm in her right lower back as well. Back pain does not radiate, and she denies any numbness/weakness in the lower legs. States she feels like this could be due to sheltering the ribcage area. She has tried self-medicating with tylenol as well as robaxin, which works somewhat. She has also tried heating packs, with some benefit. No shortness of breath or fevers.               ROS:   All other systems were reviewed and are negative.  Objective: Vital Signs: There were no vitals taken for this visit.  Physical Exam:  General:  Alert and oriented, in no acute distress. Pulm:  Breathing unlabored. Psy:  Normal mood, congruent affect. Skin:  Right chest wall with no bruising or rashes. No erythema. Overlying skin intact.   Right chest wall very tender to palpation, with pinpoint tenderness  along rib 6, near costochondral junction. Palpation of this area cause radiation into posterior aspect of rib. Rib 6 restricted in inhalation on osteopathic examination.  Lower back with tenderness to right lumbar paraspinal muscles, most specifically in right QL.   Imaging: Rib Series:  No obvious rib fractures or dislocation. Questionable cortical irregularity underlying area of maximal tenderness, though no obvious callus formation.   Assessment & Plan: Pleasant 79yo F presenting to clinic with right chest wall pain after leaning her body over a hard arm rest and feeling a 'pop' within her ribs. Concern for underlying rib fracture. Does have some muscular back pain as well, and suspect this is due to compensatory bracing of the chest wall.  - Provided with ACE Wrap for support, and discussed deep breathing exercises to prevent underlying lung complications.  - Will renew Robaxin Rx - Discusses safe tylenol dosing - Discussed Vitamin D supplementation to encourage bone healing.  - RTC if pain does not improve - Patient and her daughter express understanding, with no further questions or concerns today.      Procedures: No procedures performed        PMFS History: Patient Active Problem List   Diagnosis Date Noted  . High risk medication use 02/18/2019  . Therapeutic drug monitoring  02/18/2019  . Healthcare maintenance 02/18/2019  . ILD (interstitial lung disease) ([place]) 11/28/2016  . Multiple lung nodules on CT 11/28/2016  . Abnormal laboratory test 11/18/2016  . DDD L spine 11/18/2016  . 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 History of hypothyroidism 11/10/2016  . History of hemochromatosis 11/10/2016  . Pulmonary emphysema ([place]) 08/31/2016  . [name] phenomenon without gangrene 08/31/2016  . Sinusitis, chronic 08/31/2016  . Chronic cough 02/10/2016  . Irritable larynx 02/10/2016  . Stopped smoking with greater than 40 pack year history 02/10/2016  . Chest pain,  unspecified 10/06/2010  . Shortness of breath 10/06/2010  . Hypothyroidism   . Hereditary hemochromatosis ([employer]) 04/22/2010  . ATRIAL FIBRILLATION 10/20/2008  . [facility] 10/20/2008  . SYNCOPE AND COLLAPSE 10/20/2008   Past Medical History:  Diagnosis Date  . Atrial fibrillation ([place])    a. Flecainide therapy;  b. event monitor 4/12  . Chest pain    a. GXT myoview 4/12: no isch., EF 86%;   b. echo 4/12: EF 55-65%, grade 1 diast dysfxn, LAE  . Depression with anxiety   . GERD (gastroesophageal reflux disease)   . 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 Beryle Beams  . History of syncope   . Hypothyroidism     Family History  Problem Relation Age of Onset  . Stroke Mother   . Heart disease Father 81  . Hemochromatosis Brother     Past Surgical History:  Procedure Laterality Date  . MOUTH SURGERY    . SHOULDER ARTHROSCOPY Left   . TOTAL ABDOMINAL HYSTERECTOMY    . VARICOSE VEIN SURGERY     Social History   Occupational History  . Occupation: RETIRED    Employer: RETIRED  Tobacco Use  . Smoking status: Former Smoker    Packs/day: 1.00    Years: 42.00    Pack years: 42.00    Quit date: 06/27/1997    Years since quitting: 22.9  . Smokeless tobacco: Never Used  Vaping Use  . Vaping Use: Never used  Substance and Sexual Activity  . Alcohol use: Yes    Comment: occ  . Drug use: No  . Sexual activity: Not on file

## 2020-06-23 ENCOUNTER — Other Ambulatory Visit: Payer: Self-pay | Admitting: Internal Medicine

## 2020-08-17 ENCOUNTER — Other Ambulatory Visit (HOSPITAL_COMMUNITY)
Admission: RE | Admit: 2020-08-17 | Discharge: 2020-08-17 | Disposition: A | Discharge status: Home / Self Care | Payer: Medicare Other | Source: Ambulatory Visit | Attending: Internal Medicine | Admitting: Internal Medicine

## 2020-08-17 DIAGNOSIS — Z01812 Encounter for preprocedural laboratory examination: Secondary | ICD-10-CM | POA: Diagnosis present

## 2020-08-17 DIAGNOSIS — Z20822 Contact with and (suspected) exposure to covid-19: Secondary | ICD-10-CM | POA: Diagnosis not present

## 2020-08-18 ENCOUNTER — Ambulatory Visit (INDEPENDENT_AMBULATORY_CARE_PROVIDER_SITE_OTHER): Payer: Medicare Other | Admitting: Internal Medicine

## 2020-08-18 ENCOUNTER — Other Ambulatory Visit: Payer: Self-pay

## 2020-08-18 ENCOUNTER — Encounter: Payer: Self-pay | Admitting: Internal Medicine

## 2020-08-18 VITALS — BP 120/60 | HR 80 | Temp 97.2°F | Ht 63.75 in | Wt 144.6 lb

## 2020-08-18 DIAGNOSIS — J849 Interstitial pulmonary disease, unspecified: Secondary | ICD-10-CM

## 2020-08-18 DIAGNOSIS — Z79899 Other long term (current) drug therapy: Secondary | ICD-10-CM

## 2020-08-18 DIAGNOSIS — J432 Centrilobular emphysema: Secondary | ICD-10-CM

## 2020-08-18 DIAGNOSIS — Z7185 Encounter for immunization safety counseling: Secondary | ICD-10-CM

## 2020-08-18 DIAGNOSIS — Z5181 Encounter for therapeutic drug level monitoring: Secondary | ICD-10-CM | POA: Diagnosis not present

## 2020-08-18 DIAGNOSIS — R768 Other specified abnormal immunological findings in serum: Secondary | ICD-10-CM | POA: Diagnosis not present

## 2020-08-18 DIAGNOSIS — R053 Chronic cough: Secondary | ICD-10-CM

## 2020-08-18 DIAGNOSIS — J387 Other diseases of larynx: Secondary | ICD-10-CM

## 2020-08-18 DIAGNOSIS — Z889 Allergy status to unspecified drugs, medicaments and biological substances status: Secondary | ICD-10-CM

## 2020-08-18 DIAGNOSIS — I7300 Raynaud's syndrome without gangrene: Secondary | ICD-10-CM

## 2020-08-18 LAB — PULMONARY FUNCTION TEST
DL/VA % pred: 65 %
DL/VA: 2.69 ml/min/mmHg/L
DLCO cor % pred: 62 %
DLCO cor: 11.74 ml/min/mmHg
DLCO unc % pred: 62 %
DLCO unc: 11.74 ml/min/mmHg
FEF 25-75 Pre: 1.02 L/sec
FEF2575-%Pred-Pre: 70 %
FEV1-%Pred-Pre: 96 %
FEV1-Pre: 1.88 L
FEV1FVC-%Pred-Pre: 91 %
FEV6-%Pred-Pre: 111 %
FEV6-Pre: 2.75 L
FEV6FVC-%Pred-Pre: 104 %
FVC-%Pred-Pre: 105 %
FVC-Pre: 2.78 L
Pre FEV1/FVC ratio: 68 %
Pre FEV6/FVC Ratio: 99 %

## 2020-08-18 LAB — SARS CORONAVIRUS 2 (TAT 6-24 HRS): SARS Coronavirus 2: NEGATIVE

## 2020-08-18 NOTE — Progress Notes (Signed)
Spirometry and dlco done today. 

## 2020-08-18 NOTE — Patient Instructions (Addendum)
ICD-10-CM   1. ILD (interstitial lung disease) ([place])  J84.9   2. High risk medication use  Z79.899   3. Therapeutic drug monitoring  Z51.81   4. 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 [name] phenomenon without gangrene  I73.00   6. Centrilobular emphysema ([facility])  J43.2       Interstitial pulmonary disease ([facility]) Scl-70 antibody positive [name] phenomenon without gangrene High risk medication use Therapeutic drug monitoring  - YOu have increased cough but pulmonary fibrosis itself appears stable based on symptom score, walk test and also CT scan of the chest in September 2021   Plan  - cotninue  prednisone to 5 mg per day +  myfortic and bactrim at current dose -  Hold off on ofev given fact things are working well currently  - do cbc, bmet, LFT February/March 2022   Pulmonary emphysema, unspecified emphysema type ([place]) -  Continue spiriva  Chronic cough Irritable Larynx History of seasonal allergies  -Based on history I suspect chronic cough is due to cough neuropathy otherwise call irritable larynx.  But he has seasonal allergies playing a role in this is open question -Other reasons that could be driving her cough include interstitial lung disease [as above and stable] the underlying emphysema [stable]  Plan -Try nonpharmaceutical interventions first  -Refer to neuro rehabilitation Mr Garald Balding for voice rehab  -Take 2 days of complete voice rest without any whispering or talking  - Whenever there is urge to cough drink water or suck on sugarless lozenge  -Overall try to talk less  -If these interventions do not work then will consider gabapentin 2-47-month course   Vaccine counseling  -There is emerging evidence that immunosuppressed patients on CellCept do not respond well to Covid vaccine even despite the booster  Plan -Check Covid IgG -> if negative will refer you to monoclonal antibody EVUSHELD prophylaxis   Followup -6 weeks telephone visit or  face-to-face with nurse practitioner to report progress with cough -and consideration of gabapentin -12-16 weeks with Dr. Chase Caller for a face-to-face 30-minute visit

## 2020-08-18 NOTE — Progress Notes (Signed)
PCP Tara C, PA-[name]   HPI  IOV 02/10/2016  Chief Complaint  Patient presents with  . Pulmonary Consult    Pt referred by Dr. Melford [name] for chronic cough x 1 year. Pt states she feels she has a tickle that is causing her dry cough. Pt states she has DOE when climbing stairs. Pt deneis CP/tightness.     80 year old female with hemochromatosis homozygous gene followed by Dr. Beryle [name] (last phlebotomy many years ago and is on serial monitoring) with children and siblings with active disease. She has also atrial fibrillation followed by Dr. Caryl [name]. Reports insidious onset of chronic cough in the last 1 year. It is stable since onset. It fluctuates between mild and severe in severity. It is mostly dry in quality. Mostly present in the daytime but sometimes also wakes her up at night. It is definitely not progressive. It is episodic and present every day. Aggravated by talking sometimes and then the throat feels dry associated with tickle in the throat and also sensation the cough is coming from the upper chest retrosternally and sometimes relieved by drinking water or chewing on a lozenge. Also aggravated by seasonal changes particularly in the spring and the fall which makes her think she has allergies. There is sometimes associated gag. She also reports nonspecific occasional wheezing and associated shortness of breath that is nonspecific but present with exertion and relieved by rest No other clear cut aggravating or relieving factors.   cough associated history  - Medications: She is not on fish oil or ACE inhibitors - Sinus drainage: She does admit to spring allergies. She did have something removed from her hard palate several years ago by ENT does not know details - Acid reflux: She says she is significant acid reflux and is on Prilosec. Without which she'll have significant symptoms - Pulmonary disease: FenO 12 02/10/2016  and normal. She denies any personal history of asthma or  pulmonary fibrosis of COPD or emphysema smoked one pack per day started smoking at age 46 and quit in 1999. Making a 42 pack smoking history. Chest x-ray 08/26/2014 personally visualized is clear - Tobacco :  reports that she quit smoking about 18 years ago. She has a 42.00 pack-year smoking history. She has never used smokeless tobacco.       OV 02/18/2016  Chief Complaint  Patient presents with  . Follow-up    Pt here after PFT and HRCT. Pt denies changes in SOB and cough. Pt denies any new complaints at this time.    Follow-up chronic cough. This visit is to follow-up on test results of function test and high resolution CT chest which are described below9 results show isolated reduction in diffusion capacity which can be explained by possible early ILD and also emphysema. There are also new findings of nodule 6 mm bilaterally on lower lobes. She prefers a very cautious approach to this evaluation.     Pulmonary function test 02/12/2016 - FVC 2.8 cm/101%, FEV1 1.9 L/91%. Ratio 60/90%. Total lung capacity 115%. DLCO reduced at 14.[name]/60%. She is isolated reduction in diffusion capacity    HRCT chest 02/16/16 IMPRESSION: 1. Mild patchy subpleural reticulation in both lungs with a basilar predominance. No significant traction bronchiectasis. No frank honeycombing. Findings could represent an interstitial lung disease such as nonspecific interstitial pneumonia (NSIP), with early usual interstitial pneumonia (UIP) not excluded. A follow-up high-resolution chest CT study in 12 months is recommended to assess temporal pattern stability. 2. Bilateral lower lobe solid  pulmonary nodules, largest 6 mm. Non-contrast chest CT at 3-6 months is recommended. If the nodules are stable at time of repeat CT, then future CT at 18-24 months (from today's scan) is considered optional for low-risk patients, but is recommended for high-risk patients. This recommendation follows the consensus statement:  Guidelines for Management of Incidental Pulmonary Nodules Detected on CT Images:From the Fleischner Society 2017; published online before print (10.1148/radiol.3474259563). 3. Additional findings include mild-to-moderate centrilobular emphysema, aortic atherosclerosis and 2 vessel coronary atherosclerosis.   Electronically Signed   By: Tara [name] M.D.   On: 02/16/2016 14:14   OV 08/31/2016  Chief Complaint  Patient presents with  . Follow-up    HRCT was never scheduled. Pt states that the cough is still present >> slightly improved since last OV. Pt states that she feels her allergies has gotten it ramped up again.    Follow-up multifactorial cough associated with autoimmune antibody positive, interstitial lung disease and emphysema previous history of smoking  After last visit in August 2017 she had high resolution CT chest in December 2017 that showed persistence of ILD. I personally visualized the CT chest. He [name] for follow-up. She tells me that since August 2017 she has insidious onset of shortness of breath and is progressively worse. It is mild and present only on inclines is not therefore tenderness. In terms of her cough is persistent. It is associated with significant sinus drainage that she thinks is allergy related. She constantly clears her throat. Lab review shows autoimmune antibody positive in August 2017 with scleroderma antibody slightly elevated at 6.4. I referred her to rheumatology but she does not remember this and she did not make this follow-up. She also tells me that she definitely has a long history of [name] phenomena in her fingers but no one has ever formally diagnosed with connective tissu  e disease.  OV 11/28/2016  Chief Complaint  Patient presents with  . Follow-up    Pt here after PFT. Pt states her breathing is uchanged since last OV. Pt [name]/o dry cough.- pt states this has slightly improved since last OV. Pt denies CP/tightness and f/[name]/s.      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 Since then she has seen rheumatologis Dr. Keturah Barre. I reviewd the notes and also discussed with the patient wa a good understading of what s going on. She tells me that some of the lupus antibodies have been positive correlating with Raynaud. Patient is on beta blocker for atrial fbrillation and it is the preference by the rheumatologist Dr. D that she switc to calcium channel blocker. She has a follow-up appointment pending with her electrophysiologist Dr. Caryl [name].  She is scleroderma antibody positive but according to her rheumtolgist she does not have dermatologic  Manifestation of the disese. 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 She pulm  function test today and this is stable. In terms of arrest or symptoms she stable. The cough is actually improved. But she does get dyspneic walking up stairs especially a few flights. She does not wnt her to pulmonary habilitation because se exrcises on the treadmill although she does not monitor her saturations.   reports that she quit smoking about 19 years ago. She has a 42.00 pack-year smoking history. she has never used smokeless tobacco.   OV 06/12/2017  Chief Complaint  Patient presents with  . Follow-up    Pt states that she has been doing good since last visit. States that she has a "tickle cough" that is sporadic and has SOB that she states is when she climbs 2 flights of stairs. Denies any CP.    Follow-up combined emphysema was interstitial lung disease [autoimmune undifferentiated connective tissue disease interstitial lung disease]. Autoimmune features from May 2018 rheumatology noted with Dr. Keturah Barre: Raynaud phenomenon positive without gangrene, positive anti-cardiolipin and positive IgM and SCL-70 positive without clinical features of  scleroderma    Last seen June 2018. Since then she's stable. Overall she tells me that only problem is a tickle in her throat and slight cough. This is up and down depending on the pollen exposure she gets. She gets dyspneic for climbing few to several flights of stairs. This is unchanged. She did have full function test today and that shows mild significant worsening though overall gradient is only mild.. However she's not feeling this. She has appointment with Dr. Keturah Barre  pending    OV 07/18/2017  Chief Complaint  Patient presents with  . Follow-up    HRCT done 07/03/17.  Pt states she is the same as she was at last visit. SOB with exertion.    Follow-up combined emphysema was interstitial lung disease [autoimmune undifferentiated connective tissue disease interstitial lung disease]. Autoimmune features from May 2018 rheumatology noted with Dr. Keturah Barre: Raynaud phenomenon positive without gangrene, positive anti-cardiolipin and positive IgM and SCL-70 positive without clinical features of scleroderma    Tara [name] presents for follow-up.  She is here to discuss the results of a high-resolution CT scan of the chest.  The scan was reviewed by Dr. Rosario Jacks thoracic radiology who feels that patient has probable UIP pattern that is definitely progressive compared to August 2017 although there is no comment about progression since June 2018 CT scan.  The pulmonary nodules itself are stable since August 2017 and are likely benign.  The change in the CT scan which is mild progression pulmonary fibrosis corresponds with the pulmonary function test that shows mild progression.  She is now here with her daughter Junie Panning and her husband.  She also states that 1 of her other daughters has rheumatoid arthritis and is on TNF alpha blockade.  All her 3 daughters have hemo-chromatosis    OV 3/[name]/2019  Chief Complaint  Patient presents with  . Follow-up    Pt states she has been doing good since last visit. Pt  recently went to [place] and had some mild problems with SOB due to heat.  Pt was prescribed cellcept and bactrim but has not yet started meds due to questions with other meds she is taking.    80 year old female with ILD secondary to autoimmune disease clinically suspicious of scleroderma but also previous history of hemochromatosis with family history of hemochromatosis.  She is here with her husband.  This visit is only a discussion visit.  Because she has many questions about starting CellCept before she actually does.  We did hepatitis virus panel, QuantiFERON gold, G6PD and all this is normal.  Lab work is normal.  I checked with [facility] pharmacist about interactions and was cleared for her to start CellCept.  The only recommendation was for patient to come off Prilosec because of reduced effect of CellCept.  Patient questions revolved around CellCept: She does not want to come off PPI because of hiatal hernia and has had bad acid reflux.  So  I would not Willis Modena asking for any alternative PPI.  In addition she states she takes multiple multivitamins including Biotene, vitamin D3, vitamin D, vitamin E93 and folic acid.  She also takes Allegra occasionally.  She wants to make sure it is all okay with CellCept.  She has not had a flu shot today and she is asking if she should have it.  She has never had flu shot before.  She is also wondering about anticoagulation in the setting of CellCept and thyroid issues in the setting of Bactrim.  In terms of her genetics: Initially I corresponded with our local geneticist who thought she might be better served at [facility] but the [facility] genetics person wrote to me saying that patient should first be seen by [facility] rheumatology and hematology.  Patient's not so sure she wants to see the subspecialist.  She will speak to Dr. Beryle [name] her local hematologist to see if it is worthwhile seeing the hematology department at [facility].   Lungs/Pleura: Moderate centrilobular emphysema. Mild basilar predominant subpleural reticulation and ground-glass, increased from 02/16/2016. No traction bronchiectasis/ bronchiolectasis, architectural distortion or honeycombing. Side-by-side nodules in the medial left lower lobe measure up to 6 mm (series 3, image 95), unchanged from 02/16/2016 and considered benign. No air trapping. No pleural fluid. Airway is unremarkable.  IMPRESSION: 1. Mild progression in basilar predominant subpleural fibrosis from 02/16/2016, raising suspicion for usual interstitial pneumonitis. 2. Aortic atherosclerosis (ICD10-170.0). Moderate coronary artery calcification. 3.  Emphysema (ICD10-J43.9).   Electronically Signed   By: Lorin Picket M.D.   On: 07/03/2017 09:54    OV 09/19/2017  Chief Complaint  Patient presents with  . Follow-up    Pt states she has been doing good. Currently on cellcept and bactrim and has been doing well on it once put back on omeprazole. Pt still has the cough and sometimes when trying to take a deep breath can't fully get all O2 out.   Tara [name] returns for follow-up.  This is for autoimmune interstitial lung disease.  She was started on CellCept recently.  She is here to follow-up for therapeutic drug monitoring.  Because she was on omeprazole and we were initiating CellCept with Bactrim we asked her to hold off on omeprazole because the omeprazole impairs drug levels of CellCept.  However she tried it for a week and her acid reflux was significant despite ranitidine so she went back on omeprazole.  Pharmacist Willis Modena is advising changing the CellCept to equivaekbtdose of Myfortic. So far tolerating cellcept/pred/bactrimm well. Has seen dR G in heme and on observation. Had PFT today on cellcept/pred/bactrim and is better! Continue siwht spriva  OV [name]/12/2017  . Chief Complaint  Patient presents with  . Follow-up    Pt currently taking  myfortic and states she has been well on that. Pt states she has some mild nausea and has some problems with sleeping at night. SOB is stable,mild coughing. Denies any CP.    Follow-up progressive interstitial lung disease with SCL-70 antibody positive and Raynard Follow-up associated emphysema.  Last visit September 19, 2017.  At that time she had intolerance to the CellCept therefore we switched her to Myfortic.  She is here to report for follow-up about this.  She is tolerating Myfortic just well.  She had some transient insomnia for a few days last week but this resolved.  She only has mild intermittent occasional nausea with the Myfortic but otherwise is tolerating it really well at  the low-dose of 360 mg twice daily.  She continues on prednisone 10 mg daily and Bactrim 3 times a week.  She is due for blood work today.  There are no other new issues.  She continues on Spiriva for her emphysema   OV 02/01/2018  Chief Complaint  Patient presents with  . Follow-up    PFT performed today.  Pt stated other than having shingles earlier, things have been doing good for her. Pt states SOB is stable and states she still has an occ cough.    Follow-up progressive interstitial lung disease with SCL-70 antibody positive and Raynud - IPAF Follow-up associated emphysema   Tara [name] presents for routine follow-up with interstitial lung disease clinic. She has the above problems. In the interim she tells me she had developed shingles on the right neck but this is resolved. This is despite having zoster vaccine in 2012. She was yet to have the bnnew  shingles vaccine.in any event currently she is feeling well. She only has occasional post residual neuropathic pain. In terms of her shortness of breath it is stable and minimal. Walking desaturation test shows stability. Pulmicort function test below show stability. She is tolerating the mycophenolate myfortic well at 360 mg twice daily and prednisone 10 mg  daily and Bactrim for PCP prophylaxis. Overall she feels stable and feels that it is best medicines on touch in terms of dosing change  OV 08/14/2018  Subjective:  Patient ID: Tara [name], female , DOB: 12-Aug-1940 , age 35 y.o. , MRN: [id number] , ADDRESS: [place] [id number]   08/14/2018 -   Chief Complaint  Patient presents with  . Follow-up    PFT performed today.  Pt states she is slowly getting better after her recent pna. States she still has some issues with SOB and an occ cough. Pt also states that she feels like she has a weight on her chest at times.    Follow-up progressive interstitial lung disease with SCL-70 antibody positive and Raynud - IPAF - on myfortic since march 2019 with pred 10 and bactrim  Follow-up associated emphysema - on spiriva    HPIfortic JAMYRIA OZANICH 80 y.o. -returns for 90-monthfollow-up of her interstitial lung disease associated with emphysema.  She presents with her middle daughter EJunie Panningwho is met me before.  She continues on Myfortic and prednisone 10 mg and Bactrim prophylaxis.  She continues on Spiriva.  She had an episode of atrial fibrillation according to review of the chart and my on history with in the middle of January 2020.  At this time she had a chest x-ray that showed asymptomatic upper lobe pneumonia.  I personally visualized the chest x-ray and confirmed the findings.  Antibiotic was not prescribed.  Then I follow this up with a CT scan of the chest [we canceled her elective colonoscopy].  CT chest showed improved pneumonia findings.  I prescribed even though she was feeling well cephalexin for a week.  She says she took it.  She continues to feel well except she has baseline amount of symptoms that is documented below.  Of note the high-resolution CT chest showed an improvement in ILD compared to a year ago.  In fact her pulmonary function tests are also showing stability/improvement as documented below.  She is  very happy about this outcome with immunosuppression .  However, she is a little bit unsure why she is symptomatic in terms of shortness of breath  and cough.  She is very concerned that her fibrosis is getting worse.  She wants to have closer follow-up than every 6 months.   IMPRESSION: HRCT Jan 2020 1. Vaguely bandlike patchy consolidation and ground-glass opacity at the periphery of the right upper and superior segment right lower lobes, new since 07/03/2017 chest CT, improving compared to 07/10/2018 chest radiograph, favoring resolving pneumonia with evolving postinfectious scarring. 2. Previously described basilar predominant subpleural reticulation and ground-glass attenuation is minimal and appears decreased in the interval. No bronchiectasis or honeycombing. Findings may represent an interstitial lung disease such as nonspecific interstitial pneumonia (NSIP) or less likely early usual interstitial pneumonia (UIP). Findings are indeterminate for UIP per consensus guidelines: Diagnosis of Idiopathic Pulmonary Fibrosis: An Official ATS/ERS/JRS/ALAT Clinical Practice Guideline. Tara [name], Tara [name], Tara [name], Feb 25 2017. 3. Moderate centrilobular emphysema with mild diffuse bronchial wall thickening, suggesting COPD. 4. Two-vessel coronary atherosclerosis. [name]. Small hiatal hernia.  Aortic Atherosclerosis (ICD10-I70.0) and Emphysema (ICD10-J43.9). ROS - per HPI    OV 04/22/2019  Subjective:  Patient ID: Tara [name], female , DOB: [birth year] , age 47 y.o. , MRN: [id number] , ADDRESS: [place] [id number]   Follow-up progressive interstitial lung disease with SCL-70 antibody positive and Raynud - IPAF - on myfortic since march 2019 with pred [name] and bactrim 3x/week  Follow-up associated emphysema - on spiriva   04/22/2019 -   Chief Complaint  Patient presents with  . Follow-up    Patient reports that her breathing is doing well  at this time.      HPI Tara [name] 80 y.o. -presents for follow-up.  Last seen by myself in early part of 2020 before the pandemic.  At that time CT chest suggested possible progression.  She saw a nurse practitioner in the interim after the onset of the pandemic.  She was anxious about the progression of her ILD so she asked for a CT scan in less than 1 year.  A CT scan of the chest was done in September 2020.  This is deemed stable since early part of the year but with possible progression versus stability since January 2019.  In terms of her symptoms she continues to be stable with very minimal shortness of breath and cough.  Her pulmonary function test below also shows stability.  Her walking desaturation test also shows stability.  She is tolerating her Myfortic, prednisone and 3 times a week of Bactrim just fine.  There are no side effects.  Last blood work was in August 2020 and this was reviewed.  We discussed the possibility of switching to antifibrotic nintedanib instead of taking immunosuppressants.  The rational for this would be it is a safer drug with less long-term side effect such as lack of immunosuppression.  However she feels her current regimen is working well for her.  She also feels that she would benefit more from a immunosuppressive drug because of her autoimmune antibodies and therefore she is more inclined to continue with her current immunosuppressive regimen of prednisone [name] mg/day and Myfortic and Bactrim 3 times a week.        OV 07/04/2019  Subjective:  Patient ID: Tara [name], female , DOB: Jan 12, 1941 , age 61 y.o. , MRN: [id number] , ADDRESS: [place] [id number]   Follow-up mildy progressive - stable interstitial lung disease with SCL-70 antibody positive and Raynud - IPAF - on myfortic since march 2019  with pred [name] and bactrim 3x/week  Follow-up associated emphysema  Follow-up Raynard  Has associated hemochromatosis  Has  associated atrial fibrillation on Xarelto   07/04/2019 -   Chief Complaint  Patient presents with  . Follow-up    Pt states she has been doing well since last visit and denies any complaints.     HPI Tara [name] 80 y.o. -last visit was in October 2020.  At that time she was doing well.  Given her continued immunosuppression with prednisone and Myfortic we discussed the possibility of switching the Myfortic to the nintedanib but understanding that there uncertainties whether this is superior approach and if she could end up with temporary side effects of the nintedanib.  Based on that she decided to continue with prednisone, Myfortic and Bactrim as she was doing because things are working well.  However she later called in and wanted this change to nintedanib.  We did this change to nintedanib but then her co-pay ended up being over 500+ dollars and then she started also getting worried about the side effects with nintedanib particularly GI.  She is also on Xarelto for atrial fibrillation.  Therefore she made this visit ahead of schedule to discuss the possibility of making the switch.  I understand from her that it will be 500+ dollars every month with nintedanib as a co-pay.  In addition she is worried about the superiority of taking nintedanib.  She fully understands that Myfortic over length of time can carry cancer risk and immunosuppression risk but at this point in time she feels well and she is tolerating it well.  She also understands that nintedanib can carry temporary GI side effects.  She also understands that it may not be a superior approach.  She understands it could be an equivalent approach and definitely lower immunosuppression.  Her main systemic immunosuppressive features are lung disease and Raynaud.  She is worried this might flareup.  In terms of her symptoms there is stable as documented below.  Her walking desaturation test is also stable.   Last pulmonary function test  was in August r 2020 Last CT scan of the chest was in September 2020 Last blood work October 2020 Last echocardiogram 2012.  ROS - per HPI  OV 10/22/2019  Subjective:  Patient ID: Tara [name], female , DOB: [birth year] , age 21 y.o. , MRN: [id number] , ADDRESS: [place] [id number]   Follow-up mildy progressive - stable interstitial lung disease with SCL-70 antibody positive and Raynud - IPAF - on myfortic since march 2019 with pred [name] and bactrim 3x/week  Follow-up associated emphysema  Follow-up Raynaud  Has associated hemochromatosis  Has associated atrial fibrillation on Xarelto    10/22/2019 -   Chief Complaint  Patient presents with  . Follow-up    PFT performed today. Pt states she has been doing good since last visit. Denies any complaints with breathing.     HPI Tara [name] 80 y.o. -returns for routine follow-up.  Overall doing well.  She feels shortness of breath is stable.  She feels she is tolerating Myfortic and prednisone fine no new interim issues she has had a Covid vaccine.  She continues on high risk immunosuppressive therapy.  There is some mild diarrhea with the Myfortic but it is tolerable.  The shortness of breath scale is stable walking desaturation test is stable.  Her pulmonary function test shows stable FVC with a possible reduction in diffusion capacity.  I do not know what to make of this at this point in time.  It could be a technical issue because she is feeling stable.  She did have echocardiogram that showed grade 2 diastolic dysfunction.   She continues on her Spiriva for associated emphysema.  ROS - per HPI     OV 03/03/2020   Subjective:  Patient ID: Tara [name], female , DOB: May 28, 1941, age 21 y.o. years. , MRN: [id number],  ADDRESS: [place] [address] PCP  Kathyrn Lass, MD Providers : Treatment Team:  Attending Provider: Brand Males, MD   Chief Complaint  Patient  presents with  . Follow-up    f/u pft     Follow-up mildy progressive - stable interstitial lung disease with SCL-70 antibody positive and Raynud - IPAF - on myfortic since march 2019 with pred [name] and bactrim 3x/week  Follow-up associated emphysema  Follow-up Raynaud  Has associated hemochromatosis  Has associated atrial fibrillation on Xarelto  Mild associated diastolic dysfunction and echo January 2021    HPI Tara [name] 80 y.o. -returns for follow-up.  Overall she is doing well.  However in the last 1-2 months she says she is slightly more short of breath than usual.  She is also having slight increase in dry cough.  Based on her subjective symptom questionnaires it seems the cough itself is stable but she is definitely saying it is worse.  She is seeing the dyspnea is overall stable compared to the cough but looking at the symptom questionnaire is maybe it is a little worse.  Is hard to discern.  It has been approximately 1 year since she had a high-resolution CT chest.  She is wondering if she can have this repeated.  She is on immunosuppression.  She is also noticing -that at home her pulse oximetry on her finger varies a lot [she does have [name]]) and sometimes can be as low as 88% - 93%.  She does acknowledge that the circulation in her hands is not as good.  Pulse ox here at rest was normal.  She had pulmonary function test that shows stability.-With the FVC although I am not sure what to make of the DLCO.  She has had a Covid booster vaccine  Last echocardiogram January 2021 with mild diastolic dysfunction   PFT Results Latest Ref Rng & Units 02/28/2020 10/22/2019 02/18/2019 08/14/2018 02/01/2018 09/19/2017 06/12/2017  FVC-Pre L 2.90 2.83 2.82 2.92 2.87 3.04 2.68  FVC-Predicted Pre % 108 106 104 108 105 111 97  FVC-Post L - - - - - - -  FVC-Predicted Post % - - - - - - -  Pre FEV1/FVC % % 66 69 67 67 68 66 65  Post FEV1/FCV % % - - - - - - -  FEV1-Pre L 1.92 1.96 1.89  1.95 1.95 2.02 1.74  FEV1-Predicted Pre % 96 98 93 96 96 98 84  FEV1-Post L - - - - - - -  DLCO uncorrected ml/min/mmHg 11.88 12.46 15.31 13.19 13.07 13.83 12.17  DLCO UNC% % 63 66 81 70 54 57 50  DLCO corrected ml/min/mmHg 11.88 12.46 - - - 14.29 13.00  DLCO COR %Predicted % 63 66 - - - 59 54  DLVA Predicted % 66 73 79 68 60 61 61  TLC L - - - - - - -  TLC % Predicted % - - - - - - -  RV % Predicted % - - - - - - -  IMPRESSION: 1. Very mild basilar predominant subpleural reticulation and ground-glass, similar to minimally progressive from 07/03/2017. Nonspecific interstitial pneumonitis is favored. Findings are indeterminate for UIP per consensus guidelines: Diagnosis of Idiopathic Pulmonary Fibrosis: An Official ATS/ERS/JRS/ALAT Clinical Practice Guideline. [place]is, Tara [name], [phone], Feb 25 2017. 2. Aortic atherosclerosis (ICD10-170.0). Coronary artery calcification. 3. Enlarged pulmonic trunk, indicative of pulmonary arterial hypertension. 4.  Emphysema (ICD10-J43.9).   Electronically Signed   By: Lorin Picket M.D.   On: 03/20/2019 14:47  Echocardiogram -January 2021: Shows grade 2 diastolic dysfunction but otherwise unremarkable.   ROS - per HPI     has a past medical history of Atrial fibrillation ([place]), Chest pain, Depression with anxiety, GERD (gastroesophageal reflux disease), Hemochromatosis, History of syncope, and Hypothyroidism.   reports that she quit smoking about 22 years ago. She has a 42.00 pack-year smoking history. She has never used smokeless tobacco.  Past Surgical History:  Procedure Laterality Date  . MOUTH SURGERY    . SHOULDER ARTHROSCOPY Left   . TOTAL ABDOMINAL HYSTERECTOMY    . VARICOSE VEIN SURGERY      OV 08/18/2020  Subjective:  Patient ID: Tara [name], female , DOB: 1940/10/01 , age 37 y.o. , MRN: [id number] , ADDRESS: [place] [address] PCP Kathyrn Lass, MD Patient  Care Team: Kathyrn Lass, MD as PCP - General (Family Medicine) Deboraha Sprang, MD as PCP - Cardiology (Cardiology) Annia Belt, MD as Consulting Physician (Hematology) Deboraha Sprang, MD as Consulting Physician (Cardiology) Richmond Campbell, MD as Consulting Physician (Gastroenterology)  This Provider for this visit: Treatment Team:  Attending Provider: Brand Males, MD    08/18/2020 -   Chief Complaint  Patient presents with  . Follow-up    Doing well     Follow-up mildy progressive - stable interstitial lung disease with SCL-70 antibody positive and Raynud - IPAF - on myfortic since march 2019 with pred [name] and bactrim 3x/week  Follow-up associated emphysema  Follow-up Raynaud  Has associated hemochromatosis  Has associated atrial fibrillation on Xarelto  Mild associated diastolic dysfunction and echo January 2021     HPI Tara [name] 80 y.o. -last visit was in September 2021.  She continues to do well overall.  Shortness of breath symptom question is stable.  Walking desaturation test is stable.  She is compliant with her medications.  In September 2021 there is concern that her ILD was getting worse because of change in PFT.  So we did a CT scan of the chest radiologist reported this to be stable since 2017.  Most recently she had blood work in December 2021 chemistry panel is normal and I reviewed it.  CBC panel is also normal with stable mild anemia hemoglobin 12.4 g%.  She is up-to-date with her COVID vaccine including booster.  She had pulmonary function test today that showed some fluctuation.  Her main concern is that she is coughing more  In terms of her cough it is dry and has a barking quality to it.  Or laryngeal quality to it.  It is mostly at day and night but never wakes her up at night.  When she has it at night it is only when she wakes up.  Talking makes it worse.  Laughing makes it worse.  Drinking water makes it better.  She thinks it is  progressive.  Symptom score showed the cough is getting worse and deep.  She has  no fever or chills.  No desaturations.  We discussed the possibility of opportunistic infection but she rightfully questant if it could be opportunistic infection if everything else is stable.  There is no wheezing.  She is wondering if it could be because of allergies.  She has seasonal allergies.  Review of the records indicate this has not been investigated.  She is willing to have an investigation for this.  We discussed the possibility of cough neuropathy.  She recollects has had gabapentin before when she had shingles.  She did not have any side effects from it.   SYMPTOM SCALE - ILD 08/14/2018  04/22/2019  07/04/2019  10/22/2019  03/03/2020 139# 08/18/2020   O2 use *RA       Shortness of Breath 0 -> [name] scale with [name] being worst (score 6 If unable to do)       At rest 1 0 0 0.[name] 1 0  Simple tasks - showers, clothes change, eating, shaving 1.[name] 0 .[name] 0.$Remo'[name] 1 1  'fQBpz$ Household (dishes, doing bed, laundry) 1.[name] 1 0.[name] 0.$Remov'[name] 1 1  'MlzNrY$ Shopping 1.[name] 0 0/[name] 0.$Remov'[name] 1 1  'CnzpHN$ Walking keeping up with others of same age 29 0 0.[name] 0.[name] 1.[name] 1.[name]  Walking up Stairs 2.[name] 1 0.[name] 0/[name] 1.[name] 1.[name]  Total (40 - 48) Dyspnea Score 10- 2 2.$Remov'[name] 3 7 'gehfjK$ 63  How bad is your cough? 1.[name] 1.[name] 0.[name] 1 1.[name] 3  How bad is your fatigue 1.$RemoveBefore'[name] 1 1 2 'ruPOaEcBtNTEq$ 1.[name] 2  nausea    0 0 0.[name]  vomit    0 0 0  dairrhea    2.[name] 0 0.[name]  anxiety    0 0 0  deoresoj    0 0 0    Simple office walk 185 feet x  3 laps goal with forehead probe 07/04/2019  10/22/2019  03/03/2020 Finger probe  O2 used ra ra ra   Number laps completed $RemoveBefore'3 3 3  'ETVnNtFLaLeBP$ Comments about pace avg  avg  Resting Pulse Ox/HR 98% and 74/min 100% and 66 96% and 80  Final Pulse Ox/HR 98% and 87/min 97% and 81 95% and 83  Desaturated </= 88% no no no  Desaturated <= 3% points no no no  Got Tachycardic >/= 90/min no no no  Symptoms at end of test Mild dyspnea Very mild dyspnea no  Miscellaneous comments x        CT Chest data sept  2021  TECHNIQUE: Multidetector CT imaging of the chest was performed following the standard protocol without intravenous contrast. High resolution imaging of the lungs, as well as inspiratory and expiratory imaging, was performed.  COMPARISON:  09/09/2019, 03/20/2019, 07/03/2017, 12/06/2016, 06/15/2016  FINDINGS: Cardiovascular: Aortic atherosclerosis. Normal heart size. Three-vessel coronary artery calcifications. No pericardial effusion.  Mediastinum/Nodes: No enlarged mediastinal, hilar, or axillary lymph nodes. Thyroid gland, trachea, and esophagus demonstrate no significant findings.  Lungs/Pleura: Moderate centrilobular emphysema. No significant interval change in subtle irregular peripheral interstitial opacity predominantly seen in the bilateral lung bases, although also noted in nondependent portions of the right middle lobe and lingula. Mild lobular air trapping on expiratory phase imaging. Stable, definitively benign and faintly rim calcified 6 mm nodule of the dependent left lower lobe (series [name], image 86). No pleural effusion or pneumothorax.  Upper Abdomen: No acute abnormality.  Musculoskeletal: No chest wall mass or suspicious bone lesions identified.  IMPRESSION: 1. No significant interval change in subtle irregular peripheral interstitial opacity predominantly seen in the bilateral lung bases, although also  noted in nondependent portions of the right middle lobe and lingula. There is no appreciable change in these findings on multiple prior examinations dating back to 2017. Bland post infectious or inflammatory scarring is favored, although minimal fibrotic interstitial lung disease is not excluded and if characterized by ATS pulmonary fibrosis criteria, findings are consistent with an early "indeterminate for UIP" pattern. Consider ongoing follow-up CT to assess for stability of fibrotic findings. Findings are indeterminate for UIP per consensus  guidelines: Diagnosis of Idiopathic Pulmonary Fibrosis: An Official ATS/ERS/JRS/ALAT Clinical Practice Guideline. [place], Tara [name], [phone], Feb 25 2017. 2. Emphysema (ICD10-J43.9). 3. Coronary artery disease.  Aortic Atherosclerosis (ICD10-I70.0).   Electronically Signed   By: Eddie Candle M.D.   On: 03/16/2020 14:12   PFT  PFT Results Latest Ref Rng & Units 08/18/2020 02/28/2020 10/22/2019 02/18/2019 08/14/2018 02/01/2018 09/19/2017  FVC-Pre L 2.78 2.90 2.83 2.82 2.92 2.87 3.04  FVC-Predicted Pre % 105 108 106 104 108 105 111  FVC-Post L - - - - - - -  FVC-Predicted Post % - - - - - - -  Pre FEV1/FVC % % 68 66 69 67 67 68 66  Post FEV1/FCV % % - - - - - - -  FEV1-Pre L 1.88 1.92 1.96 1.89 1.95 1.95 2.02  FEV1-Predicted Pre % 96 96 98 93 96 96 98  FEV1-Post L - - - - - - -  DLCO uncorrected ml/min/mmHg 11.74 11.88 12.46 15.31 13.19 13.07 13.83  DLCO UNC% % 62 63 66 81 70 54 57  DLCO corrected ml/min/mmHg 11.74 11.88 12.46 - - - 14.29  DLCO COR %Predicted % 62 63 66 - - - 59  DLVA Predicted % 65 66 73 79 68 60 61  TLC L - - - - - - -  TLC % Predicted % - - - - - - -  RV % Predicted % - - - - - - -       has a past medical history of Atrial fibrillation ([place]), Chest pain, Depression with anxiety, GERD (gastroesophageal reflux disease), Hemochromatosis, History of syncope, and Hypothyroidism.   reports that she quit smoking about 23 years ago. She has a 42.00 pack-year smoking history. She has never used smokeless tobacco.  Past Surgical History:  Procedure Laterality Date  . MOUTH SURGERY    . SHOULDER ARTHROSCOPY Left   . TOTAL ABDOMINAL HYSTERECTOMY    . VARICOSE VEIN SURGERY      No Known Allergies  Immunization History  Administered Date(s) Administered  . Fluad Quad(high Dose 65+) 02/18/2019  . Influenza Split 03/27/2019  . Influenza, High Dose Seasonal PF 08/29/2017, 04/26/2018, 04/29/2020  . PFIZER(Purple Top)SARS-COV-2 Vaccination  07/22/2019, 08/12/2019, 03/11/2020  . Pneumococcal Conjugate-13 06/12/2014  . Pneumococcal Polysaccharide-23 06/27/2010, 04/25/2018  . Tdap 03/16/2011, 07/05/2019  . Zoster 06/27/2010, 04/08/2019  . Zoster Recombinat (Shingrix) 04/08/2019    Family History  Problem Relation Age of Onset  . Stroke Mother   . Heart disease Father 38  . Hemochromatosis Brother      Current Outpatient Medications:  .  acetaminophen (TYLENOL) 325 MG tablet, Take 650 mg by mouth every 6 (six) hours as needed for mild pain or headache., Disp: , Rfl:  .  apixaban (ELIQUIS) [name] MG TABS tablet, Take 1 tablet ([name] mg total) by mouth 2 (two) times daily., Disp: 180 tablet, Rfl: 3 .  atorvastatin (LIPITOR) 10 MG tablet, Take 10 mg by mouth daily., Disp: ,  Rfl:  .  Cyanocobalamin (VITAMIN B 12 PO), Take 1,000 mcg by mouth daily., Disp: , Rfl:  .  flecainide (TAMBOCOR) 100 MG tablet, Take 1 tablet (100 mg total) by mouth 2 (two) times daily. Please keep upcoming appt in Dec for further refills, Disp: 180 tablet, Rfl: 0 .  folic acid (FOLVITE) 1 MG tablet, Take 1 mg by mouth daily., Disp: , Rfl:  .  levothyroxine (SYNTHROID, LEVOTHROID) 112 MCG tablet, Take 112 mcg by mouth daily., Disp: , Rfl:  .  methocarbamol (ROBAXIN) 750 MG tablet, Take 1 tablet (750 mg total) by mouth every 6 (six) hours as needed for muscle spasms., Disp: 60 tablet, Rfl: 3 .  metoprolol succinate (TOPROL-XL) 25 MG 24 hr tablet, TAKE 1 TABLET BY MOUTH  DAILY (Patient taking differently: Take 25 mg by mouth daily.), Disp: 90 tablet, Rfl: 2 .  mycophenolate (MYFORTIC) 360 MG TBEC EC tablet, TAKE 1 TABLET BY MOUTH TWICE DAILY, Disp: 180 tablet, Rfl: 3 .  omeprazole (PRILOSEC) 40 MG capsule, Take 40 mg by mouth daily., Disp: , Rfl:  .  ondansetron (ZOFRAN ODT) 4 MG disintegrating tablet, Take 1-2 tablets three times daily as needed, Disp: 42 tablet, Rfl: 1 .  predniSONE (DELTASONE) [name] MG tablet, Take 1 tablet ([name] mg total) by mouth daily with breakfast.,  Disp: 90 tablet, Rfl: 3 .  sulfamethoxazole-trimethoprim (BACTRIM DS) 800-160 MG tablet, TAKE 1 TABLET BY MOUTH ON MONDAY, WEDNESDAY AND FRIDAY, Disp: 90 tablet, Rfl: 3 .  Tiotropium Bromide Monohydrate (SPIRIVA RESPIMAT) 2.[name] MCG/ACT AERS, Inhale 2 puffs into the lungs daily., Disp: 1 each, Rfl: 3 .  VITAMIN D PO, Take [name],000 Units by mouth 4 (four) times a week., Disp: , Rfl:       Objective:   Vitals:   08/18/20 1615  BP: 120/60  Pulse: 80  Temp: (!) 97.2 F (36.2 [name])  TempSrc: Oral  SpO2: 94%  Weight: 144 lb 9.6 oz (65.6 kg)  Height: [name]' 3.75" (1.619 m)    Estimated body mass index is 25.02 kg/m as calculated from the following:   Height as of this encounter: [name]' 3.75" (1.619 m).   Weight as of this encounter: 144 lb 9.6 oz (65.6 kg).  $Rem'@WEIGHTCHANGE'ayTS$ @  [employer]   08/18/20 1615  Weight: 144 lb 9.6 oz (65.6 kg)     Physical Exam General: No distress.  Looks well Neuro: Alert and Oriented x 3. GCS 15. Speech normal Psych: Pleasant Resp:  Barrel Chest - n.  Wheeze - no, Crackles - mild at base, No overt respiratory distress CVS: Normal heart sounds. Murmurs - n Ext: Stigmata of Connective Tissue Disease - raynaud + HEENT: Normal upper airway. PEERL +. No post nasal drip        Assessment:       ICD-10-CM   1. ILD (interstitial lung disease) ([place])  J84.9   2. High risk medication use  Z79.899   3. Therapeutic drug monitoring  Z51.81   4. 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 [name] phenomenon without gangrene  I73.00   6. Centrilobular emphysema ([place])  J43.2   7. Chronic cough  R05.3   8. Irritable larynx  J38.7   9. History of seasonal allergies  Z88.9   10. Vaccine counseling  Z71.85    She is immunosuppressed and she needs intense therapeutic monitoring on a close basis.  Her cough is likely related reactive cough neuropathy plus minus seasonal allergies.  ILD itself is stable.  Plan:     Patient Instructions     ICD-10-CM   1. ILD (interstitial  lung disease) ([place])  J84.9   2. High risk medication use  Z79.899   3. Therapeutic drug monitoring  Z51.81   4. 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 [name] phenomenon without gangrene  I73.00   6. Centrilobular emphysema ([facility])  J43.2       Interstitial pulmonary disease ([facility]) Scl-70 antibody positive [name] phenomenon without gangrene High risk medication use Therapeutic drug monitoring  - YOu have increased cough but pulmonary fibrosis itself appears stable based on symptom score, walk test and also CT scan of the chest in September 2021   Plan  - cotninue  prednisone to [name] mg per day +  myfortic and bactrim at current dose -  Hold off on ofev given fact things are working well currently  - do cbc, bmet, LFT February/March 2022   Pulmonary emphysema, unspecified emphysema type ([place]) -  Continue spiriva  Chronic cough Irritable Larynx History of seasonal allergies  -Based on history I suspect chronic cough is due to cough neuropathy otherwise call irritable larynx.  But he has seasonal allergies playing a role in this is open question -Other reasons that could be driving her cough include interstitial lung disease [as above and stable] the underlying emphysema [stable]  Plan -Try nonpharmaceutical interventions first  -Refer to neuro rehabilitation Mr Garald Balding for voice rehab  -Take 2 days of complete voice rest without any whispering or talking  - Whenever there is urge to cough drink water or suck on sugarless lozenge  -Overall try to talk less  -If these interventions do not work then will consider gabapentin 2-15-month course   Vaccine counseling  -There is emerging evidence that immunosuppressed patients on CellCept do not respond well to Covid vaccine even despite the booster  Plan -Check Covid IgG -> if negative will refer you to monoclonal antibody EVUSHELD prophylaxis   Followup -6 weeks telephone visit or face-to-face with nurse practitioner to  report progress with cough -and consideration of gabapentin -12-16 weeks with Dr. Chase Caller for a face-to-face 30-minute visit   ( Level 05 visit: Estb 40-54 min  in  visit type: on-site physical face to visit  in total care time and counseling or/and coordination of care by this undersigned MD - Dr Brand Males. This includes one or more of the following on this same day 08/18/2020: pre-charting, chart review, note writing, documentation discussion of test results, diagnostic or treatment recommendations, prognosis, risks and benefits of management options, instructions, education, compliance or risk-factor reduction. It excludes time spent by the [place] or office staff in the care of the patient. Actual time 40 min)   SIGNATURE    Dr. Brand Males, M.D., F.[name].[name].P,  Pulmonary and Critical Care Medicine Staff Physician, [place] Director - Interstitial Lung Disease  Program  [facility] [place], [place], [address]  Pager: [phone], If no answer or between  15:00h - 7:00h: call [phone] Telephone: [phone]  [name]:12 PM 08/18/2020

## 2020-08-20 ENCOUNTER — Other Ambulatory Visit (INDEPENDENT_AMBULATORY_CARE_PROVIDER_SITE_OTHER): Payer: Medicare Other

## 2020-08-20 DIAGNOSIS — J849 Interstitial pulmonary disease, unspecified: Secondary | ICD-10-CM

## 2020-08-20 DIAGNOSIS — Z79899 Other long term (current) drug therapy: Secondary | ICD-10-CM

## 2020-08-20 DIAGNOSIS — Z7185 Encounter for immunization safety counseling: Secondary | ICD-10-CM

## 2020-08-20 DIAGNOSIS — Z5181 Encounter for therapeutic drug level monitoring: Secondary | ICD-10-CM | POA: Diagnosis not present

## 2020-08-20 DIAGNOSIS — Z889 Allergy status to unspecified drugs, medicaments and biological substances status: Secondary | ICD-10-CM

## 2020-08-20 DIAGNOSIS — J432 Centrilobular emphysema: Secondary | ICD-10-CM

## 2020-08-20 DIAGNOSIS — R053 Chronic cough: Secondary | ICD-10-CM

## 2020-08-20 DIAGNOSIS — I7300 Raynaud's syndrome without gangrene: Secondary | ICD-10-CM

## 2020-08-20 DIAGNOSIS — J387 Other diseases of larynx: Secondary | ICD-10-CM

## 2020-08-20 DIAGNOSIS — R768 Other specified abnormal immunological findings in serum: Secondary | ICD-10-CM | POA: Diagnosis not present

## 2020-08-20 LAB — BASIC METABOLIC PANEL
BUN: 15 mg/dL (ref 6–23)
CO2: 28 mEq/L (ref 19–32)
Calcium: 9.4 mg/dL (ref 8.4–10.5)
Chloride: 102 mEq/L (ref 96–112)
Creatinine, Ser: 0.78 mg/dL (ref 0.40–1.20)
GFR: 72.39 mL/min (ref >60.00)
Glucose, Bld: 108 mg/dL — ABNORMAL HIGH (ref 70–99)
Potassium: 3.9 mEq/L (ref 3.5–5.1)
Sodium: 138 mEq/L (ref 135–145)

## 2020-08-20 LAB — CBC WITH DIFFERENTIAL/PLATELET
Basophils Absolute: 0 10*3/uL (ref 0.0–0.1)
Basophils Relative: 0.9 % (ref 0.0–3.0)
Eosinophils Absolute: 0.1 10*3/uL (ref 0.0–0.7)
Eosinophils Relative: 1.1 % (ref 0.0–5.0)
HCT: 36.1 % (ref 36.0–46.0)
Hemoglobin: 12 g/dL (ref 12.0–15.0)
Lymphocytes Relative: 15.4 % (ref 12.0–46.0)
Lymphs Abs: 0.8 10*3/uL (ref 0.7–4.0)
MCHC: 33.2 g/dL (ref 30.0–36.0)
MCV: 101.3 fl — ABNORMAL HIGH (ref 78.0–100.0)
Monocytes Absolute: 0.3 10*3/uL (ref 0.1–1.0)
Monocytes Relative: 5.8 % (ref 3.0–12.0)
Neutro Abs: 3.9 10*3/uL (ref 1.4–7.7)
Neutrophils Relative %: 76.8 % (ref 43.0–77.0)
Platelets: 232 10*3/uL (ref 150.0–400.0)
RBC: 3.56 Mil/uL — ABNORMAL LOW (ref 3.87–5.11)
RDW: 14.3 % (ref 11.5–15.5)
WBC: 5 10*3/uL (ref 4.0–10.5)

## 2020-08-20 LAB — HEPATIC FUNCTION PANEL
ALT: 13 U/L (ref 0–35)
AST: 15 U/L (ref 0–37)
Albumin: 4.2 g/dL (ref 3.5–5.2)
Alkaline Phosphatase: 46 U/L (ref 39–117)
Bilirubin, Direct: 0.1 mg/dL (ref 0.0–0.3)
Total Bilirubin: 0.5 mg/dL (ref 0.2–1.2)
Total Protein: 6.7 g/dL (ref 6.0–8.3)

## 2020-08-22 NOTE — Progress Notes (Signed)
Labs are stable from 08/20/20. Will NOT call with these resut

## 2020-08-31 ENCOUNTER — Encounter: Payer: Self-pay | Admitting: Internal Medicine

## 2020-08-31 ENCOUNTER — Ambulatory Visit (INDEPENDENT_AMBULATORY_CARE_PROVIDER_SITE_OTHER): Payer: Medicare Other | Admitting: Internal Medicine

## 2020-08-31 ENCOUNTER — Other Ambulatory Visit: Payer: Self-pay

## 2020-08-31 VITALS — BP 112/70 | HR 79 | Ht 63.75 in | Wt 144.0 lb

## 2020-08-31 DIAGNOSIS — I4891 Unspecified atrial fibrillation: Secondary | ICD-10-CM

## 2020-08-31 NOTE — Patient Instructions (Signed)

## 2020-08-31 NOTE — Progress Notes (Signed)
Patient Care Team: Kathyrn Lass, MD as PCP - General (Family Medicine) Deboraha Sprang, MD as PCP - Cardiology (Cardiology) Annia Belt, MD as Consulting Physician (Hematology) Deboraha Sprang, MD as Consulting Physician (Cardiology) Richmond Campbell, MD as Consulting Physician (Gastroenterology)   HPI  [name] is a 80 y.o. female seen in followup for atrial fibrillation for which he takes flecainide 50 mg twice daily. This occurs  in the context of hemochromatosis.   .        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   Normal nuclear study 4/15  Seen in the ER 1/20 for atrial fibrillation with a rapid rate associated with chest pain and dyspnea.  Cardioverted and discharged.  Has seen Dr. MR interim currently for the development of an insidious cough in the context of interstitial lung disease interval CT scan 2021 demonstrated no significant interval change   Date Cr K Hgb  4/18 0.74  12.4  1/20 0.78 3.9 11.4  3/20 0.75 3.8 12.0  8/20 0.65 4.4 12.5  2/22 0.78 3.9 12.0   DATE PR interval QRSduration Dose  1/20 164 78 50  3/20 162 92 50  10/20 160 90 100  3/22 208 86 100   The patient denies chest pain  nocturnal dyspnea, orthopnea or peripheral edema.  There have been no palpitations, lightheadedness or syncope.  Mild dyspnea  Chest x-ray had demonstrated pulmonary infiltrate.  She has intercurrently been diagnosed with pulmonary fibrosis CT scan pending for today  Her CHADS-VASc score is 3; age/gender.   anticoagulation with Rivaroxaban   Past Medical History:  Diagnosis Date  . Atrial fibrillation ([place])    a. Flecainide therapy;  b. event monitor 4/12  . Chest pain    a. GXT myoview 4/12: no isch., EF 86%;   b. echo 4/12: EF 55-65%, grade 1  diast dysfxn, LAE  . Depression with anxiety   . GERD (gastroesophageal reflux disease)   . 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 Beryle Beams  . History of syncope   . Hypothyroidism     Past Surgical History:  Procedure Laterality Date  . MOUTH SURGERY    . SHOULDER ARTHROSCOPY Left   . TOTAL ABDOMINAL HYSTERECTOMY    . VARICOSE VEIN SURGERY      Current Outpatient Medications  Medication Sig Dispense Refill  . acetaminophen (TYLENOL) 325 MG tablet Take 650 mg by mouth every 6 (six) hours as needed for mild pain or headache.    Marland Kitchen apixaban (ELIQUIS) 5 MG TABS tablet Take 1 tablet (5 mg total) by mouth 2 (two) times daily. 180 tablet 3  . atorvastatin (LIPITOR) 10 MG tablet Take 10 mg by mouth daily.    . Cyanocobalamin (VITAMIN B 12 PO) Take 1,000 mcg by mouth daily.    . flecainide (TAMBOCOR) 100 MG tablet Take 1 tablet (100 mg total) by mouth 2 (two) times daily. Please keep upcoming appt in Dec for further refills 277 tablet 0  . folic acid (FOLVITE) 1 MG tablet Take 1 mg by mouth daily.    Marland Kitchen levothyroxine (SYNTHROID, LEVOTHROID) 112 MCG tablet Take 112 mcg by mouth daily.    . methocarbamol (ROBAXIN) 750 MG tablet Take 1 tablet (750 mg total) by  mouth every 6 (six) hours as needed for muscle spasms. 60 tablet 3  . metoprolol succinate (TOPROL-XL) 25 MG 24 hr tablet TAKE 1 TABLET BY MOUTH  DAILY 90 tablet 2  . mycophenolate (MYFORTIC) 360 MG TBEC EC tablet TAKE 1 TABLET BY MOUTH TWICE DAILY 180 tablet 3  . omeprazole (PRILOSEC) 20 MG capsule Take 20 mg by mouth 2 (two) times daily.    . ondansetron (ZOFRAN ODT) 4 MG disintegrating tablet Take 1-2 tablets three times daily as needed 42 tablet 1  . predniSONE (DELTASONE) 5 MG tablet Take 1 tablet (5 mg total) by mouth daily with breakfast. 90 tablet 3  . sulfamethoxazole-trimethoprim (BACTRIM DS) 800-160 MG tablet TAKE 1 TABLET BY MOUTH ON MONDAY, WEDNESDAY AND FRIDAY 90 tablet 3  . Tiotropium Bromide  Monohydrate (SPIRIVA RESPIMAT) 2.5 MCG/ACT AERS Inhale 2 puffs into the lungs daily. 1 each 3  . VITAMIN D PO Take 5,000 Units by mouth 4 (four) times a week.     No current facility-administered medications for this visit.    No Known Allergies  Review of Systems negative except from HPI and PMH  Physical Exam BP 112/70 (BP Location: Left Arm, Patient Position: Sitting, Cuff Size: Normal)   Pulse 79   Ht 5' 3.75" (1.619 m)   Wt 144 lb (65.3 kg)   SpO2 95%   BMI 24.91 kg/m  Well developed and nourished in no acute distress HENT normal Neck supple   Clear Regular rate and rhythm, no murmurs or gallops Abd-soft with active BS No Clubbing cyanosis edema Skin-warm and dry A & Oriented  Grossly normal sensory and motor function  ECG sinus at 79 21/09/39 Low voltage   Assessment and  Plan  Atrial Fibrillation-persistent     Raynauds  Pulmonary fibrosis  Hemochromatosis   Low Voltage  Euvolemic.  Continue current medications  No intercurrent atrial fibrillation/flutter.  Continue flecainide.  Conduction parameters are within normal range  Husband died 9/4:21 month battle with cancer.  Known each other 46 years

## 2020-09-02 ENCOUNTER — Other Ambulatory Visit: Payer: Self-pay | Admitting: Internal Medicine

## 2020-09-14 ENCOUNTER — Other Ambulatory Visit: Payer: Self-pay

## 2020-09-14 ENCOUNTER — Ambulatory Visit: Payer: Medicare Other | Attending: Internal Medicine

## 2020-09-14 DIAGNOSIS — R498 Other voice and resonance disorders: Secondary | ICD-10-CM | POA: Diagnosis present

## 2020-09-14 NOTE — Patient Instructions (Addendum)
  GERD handout provided  Vocal Cord Dysfunction handout from [employer] provided   Practice abdominal breathing 10-15 minutes three times a day.  If you are in a coughing spell, think about one of your relaxing scenes.

## 2020-09-14 NOTE — Therapy (Signed)
[facility] [address] [place], [place], [address] Phone: [phone]   Fax:  [phone]  Speech Language Pathology Evaluation  Patient Details  Name: [name] MRN: [id number] Date of Birth: Jan 11, 1941 Referring Provider (SLP): Dennie Fetters, MD   Encounter Date: 09/14/2020   End of Session - 09/14/20 1655    Visit Number 1    Number of Visits 17    Date for SLP Re-Evaluation 12/13/20    SLP Start Time 4818    SLP Stop Time  1445    SLP Time Calculation (min) 42 min    Activity Tolerance Patient tolerated treatment well           Past Medical History:  Diagnosis Date  . Atrial fibrillation ([place])    a. Flecainide therapy;  b. event monitor 4/12  . Chest pain    a. GXT myoview 4/12: no isch., EF 86%;   b. echo 4/12: EF 55-65%, grade 1 diast dysfxn, LAE  . Depression with anxiety   . GERD (gastroesophageal reflux disease)   . 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 Beryle Beams  . History of syncope   . Hypothyroidism     Past Surgical History:  Procedure Laterality Date  . MOUTH SURGERY    . SHOULDER ARTHROSCOPY Left   . TOTAL ABDOMINAL HYSTERECTOMY    . VARICOSE VEIN SURGERY      There were no vitals filed for this visit.   Subjective Assessment - 09/14/20 1636    Subjective "(the cough) has gotten worse over the last 4 months."    Currently in Pain? No/denies              SLP Evaluation [facility] - 09/14/20 0913      SLP Visit Information   SLP Received On 09/14/20    Referring Provider (SLP) Dennie Fetters, MD    Onset Date 18 months, with incr moreso in last 4 months    Medical Diagnosis Irritable Larynx/Cough Neuropathy/Chronic Cough      Subjective   Subjective "I know the last few days with the trees blooming my allergies have been miserable - so that is probably playing a cause."    Patient/Family Stated Goal improve communication by reducing  cough frequency      General Information   HPI Pt with hx of GERD, ILD, Emphysema, former smoker.      Balance Screen   Has the patient fallen in the past 6 months No      Prior Functional Status   Cognitive/Linguistic Baseline Within functional limits      Cognition   Overall Cognitive Status Within Functional Limits for tasks assessed      Auditory Comprehension   Overall Auditory Comprehension Appears within functional limits for tasks assessed      Verbal Expression   Overall Verbal Expression Appears within functional limits for tasks assessed      Oral Motor/Sensory Function   Overall Oral Motor/Sensory Function Appears within functional limits for tasks assessed      Motor Speech   Respiration Impaired   chest breather   Level of Impairment Word    Phonation Other (comment)   intermittently harsh   Vocal Abuses Habitual Cough/Throat Clear   21 throat clears and 3 coughing "spells" in 40 minute eval   Pitch Low                  Throat clearing during the 40-minute  eval occurred 21 times, coughing 3 times.  A detailed explanation of symptoms and causes (or triggers) of vocal cord dysfunction (VCD)/irritable larynx/cyclical cough was provided, with a handout. Pt education in cases of VCD has been shown to improve pt outcomes in therapy. Pt's reported symptoms of VCD included coughing/throat clearing and hoarse voice, and possibly tightness in throat/chest. SLP would be classify pt's voice today as mildly hoarse/harsh. Reported triggers of VCD were GERD, voice use, and exercise.  SLP and pt discussed  a mental image of relaxation (sitting in a lounge chair on a terrace at the beach or sitting in a cushy chair in front of a fire under a blanket with a glass of wine on the table next to her). During this discussion, pt exhibited no episodes of throat clearing. SLP taught pt how abdominal breathing (AB) can inhibit/eliminate symptoms of irritable larynx, and instructed her on how  to feel abdomen protrude with inhalation and relax with exhalation. She return demo'd with rare min cues necessary with AB at rest. This took approx 5 minutes, and SLP instructed pt to practice this breathing 10-15 minutes x3/day.                  SLP Education - 09/14/20 1646    Education Details Abdominal breathing, GERD handout, What is vocal cord dysfunction(VCD), relaxation images    Person(s) Educated Patient    Methods Handout;Demonstration;Explanation;Verbal cues    Comprehension Verbalized understanding;Returned demonstration;Verbal cues required;Need further instruction            SLP Short Term Goals - 09/14/20 0916      SLP SHORT TERM GOAL #1   Title pt will demo abdominal breathing 80% of the time at rest in 2 sessions    Time 4    Period Weeks   or 9 visits, for all STGs   Status New      SLP SHORT TERM GOAL #2   Title pt will demo abdominal breathing 75% in simple sentence responses x2 sessions    Time 4    Period Weeks    Status New      SLP SHORT TERM GOAL #3   Title pt will report relaxation strategies effective to shorten cough duration and/or cough severity 50% of the time during a vocal cord dysfucntion (VCD) episode    Time 4    Period Weeks    Status New      SLP SHORT TERM GOAL #4   Title pt will report 50% decr in VCD s/sx compared to pre-ST    Time 4    Period Weeks    Status New            SLP Long Term Goals - 09/14/20 6144      SLP LONG TERM GOAL #1   Title pt will demo abdominal breathing in 8 minutes simple-mod complex conversation without s/sx VCD in 3 sessions    Time 8    Period Weeks   or 17 total visits, for all LTGS   Status New      SLP LONG TERM GOAL #2   Title pt will report doing relaxation strategies effective to shorten cough duration and/or cough severity during a vocal cord dysfucntion (VCD) episode at least 75% of the time between 3 sessions    Time 8    Period Weeks    Status New      SLP LONG TERM  GOAL #3   Title pt will report at  least 75% reduction in s/sx of VCD compared to pre-ST    Time 8    Period Weeks    Status New      SLP LONG TERM GOAL #4   Title pt's Cough Severity Index (SCI) score will decr < 7    Time 8    Period Weeks    Status New      SLP LONG TERM GOAL #5   Title pt will report feeling of need to clear throat or clearing throat =/< 3 times during 3 sessions    Time [place] - 09/14/20 1655    Clinical Impression Statement Manuel Lawhead presents today with decr'd speech/language output due to irritable larynx/cough neuropathy/vocal cord dysfunction. Her Cough Severity Index score was 12. She reports frequent coughing during the day which has increased in teh last four months and this has hindered communication with family and in the community. SLP believes skilled ST is necessary for improving communication by teaching pt techniques to decr cough and throat clear frequency.    Speech Therapy Frequency 2x / week    Duration 8 weeks   or 17 total visits   Treatment/Interventions Compensatory techniques;Patient/family education;Internal/external aids;SLP instruction and feedback;Other (comment)   abdominal breathing   Potential to Achieve Goals Good    SLP Home Exercise Plan provided    Consulted and Agree with Plan of Care Patient           Patient will benefit from skilled therapeutic intervention in order to improve the following deficits and impairments:   Other voice and resonance disorders    Problem List Patient Active Problem List   Diagnosis Date Noted  . High risk medication use 02/18/2019  . Therapeutic drug monitoring 02/18/2019  . Healthcare maintenance 02/18/2019  . ILD (interstitial lung disease) ([place]) 11/28/2016  . Multiple lung nodules on CT 11/28/2016  . Abnormal laboratory test 11/18/2016  . DDD L spine 11/18/2016  . 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 History of hypothyroidism  11/10/2016  . History of hemochromatosis 11/10/2016  . Pulmonary emphysema ([place]) 08/31/2016  . [name] phenomenon without gangrene 08/31/2016  . Sinusitis, chronic 08/31/2016  . Chronic cough 02/10/2016  . Irritable larynx 02/10/2016  . Stopped smoking with greater than 40 pack year history 02/10/2016  . Chest pain, unspecified 10/06/2010  . Shortness of breath 10/06/2010  . Hypothyroidism   . Hereditary hemochromatosis ([place]) 04/22/2010  . ATRIAL FIBRILLATION 10/20/2008  . [facility] 10/20/2008  . SYNCOPE AND COLLAPSE 10/20/2008    [facility] 09/14/2020, 5:24 PM  [facility] [address] [place], [place], [address] Phone: [phone]   Fax:  [phone]  Name: ZONNIQUE NORKUS MRN: [id number] Date of Birth: April 09, 1941

## 2020-09-21 ENCOUNTER — Ambulatory Visit: Payer: Medicare Other | Admitting: Speech Pathology

## 2020-09-23 ENCOUNTER — Encounter: Payer: Self-pay | Admitting: Speech Pathology

## 2020-09-23 ENCOUNTER — Ambulatory Visit: Payer: Medicare Other | Admitting: Speech Pathology

## 2020-09-23 ENCOUNTER — Other Ambulatory Visit: Payer: Self-pay

## 2020-09-23 DIAGNOSIS — R498 Other voice and resonance disorders: Secondary | ICD-10-CM

## 2020-09-23 NOTE — Therapy (Signed)
[facility] [address] [place], [place], [address] Phone: [phone]   Fax:  [phone]  Speech Language Pathology Treatment  Patient Details  Name: [name] MRN: [id number] Date of Birth: 05-Sep-1940 Referring Provider (SLP): Dennie Fetters, MD   Encounter Date: 09/23/2020   End of Session - 09/23/20 1157    Visit Number 2    Number of Visits 17    Date for SLP Re-Evaluation 12/13/20    SLP Start Time 0847    SLP Stop Time  0930    SLP Time Calculation (min) 43 min    Activity Tolerance Patient tolerated treatment well           Past Medical History:  Diagnosis Date  . Atrial fibrillation ([place])    a. Flecainide therapy;  b. event monitor 4/12  . Chest pain    a. GXT myoview 4/12: no isch., EF 86%;   b. echo 4/12: EF 55-65%, grade 1 diast dysfxn, LAE  . Depression with anxiety   . GERD (gastroesophageal reflux disease)   . 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 Beryle Beams  . History of syncope   . Hypothyroidism     Past Surgical History:  Procedure Laterality Date  . MOUTH SURGERY    . SHOULDER ARTHROSCOPY Left   . TOTAL ABDOMINAL HYSTERECTOMY    . VARICOSE VEIN SURGERY      There were no vitals filed for this visit.   Subjective Assessment - 09/23/20 0851    Subjective "I think it's allergies"                 ADULT SLP TREATMENT - 09/23/20 0854      General Information   Behavior/Cognition Alert;Cooperative;Pleasant mood      Treatment Provided   Treatment provided Cognitive-Linquistic      Cognitive-Linquistic Treatment   Treatment focused on Dysarthria;Patient/family/caregiver education    Skilled Treatment [name] reported success using abdominal breathing to reduce a cough episode in church, triggered by perfume. She reports she hasn't been "diligent" in practicing abdominal breathing. Today, she required initial min a to use "sniff"  instead of mouth inhalation, after which she was supervission cues. Today we targeted eliminating throat clearing. Pat reqiured verbal or visual cues to ID each time she cleared her throat. She verbalized that she was not aware she was doing this and stated "I think it's a habit, I don't feel anything in my throat." She will engage family in helping her ID and reduce throat clears (11 this session) and trained her in throat clear alternatives including hard swallow, liquid sip and forceful pant, with usual min to mod modeling and cues. She continues to awaken at night coughing, We discussed taking 1 omeprazole in am and the other before bed (she was taking 2 in am). She stated her MD said she could do this. Educated option to raise [facility] as well. Instructed Pat to complete sniff-blow abdominal & relaxation throughout the day and to use this to "pre-treat" VCD symptoms. For example, on the way to church or before conversations or visits. Added audible /sh/ if cough in uncontrolled with silent abdominal sniff-blow      Progression Toward Goals   Progression toward goals Progressing toward goals            SLP Education - 09/23/20 1155    Education Details throat clear cycle and damage, throat clear alternatives, compensations for VCD, relfux precautions  Person(s) Educated Patient    Methods Explanation;Demonstration;Verbal cues;Handout    Comprehension Verbalized understanding;Returned demonstration;Verbal cues required;Need further instruction            SLP Short Term Goals - 09/23/20 1156      SLP SHORT TERM GOAL #1   Title pt will demo abdominal breathing 80% of the time at rest in 2 sessions    Baseline 09/23/20;    Time 4    Period Weeks   or 9 visits, for all STGs   Status On-going      SLP SHORT TERM GOAL #2   Title pt will demo abdominal breathing 75% in simple sentence responses x2 sessions    Time 4    Period Weeks    Status On-going      SLP SHORT TERM GOAL #3   Title pt  will report relaxation strategies effective to shorten cough duration and/or cough severity 50% of the time during a vocal cord dysfucntion (VCD) episode    Time 4    Period Weeks    Status On-going      SLP SHORT TERM GOAL #4   Title pt will report 50% decr in VCD s/sx compared to pre-ST    Time 4    Period Weeks    Status On-going            SLP Long Term Goals - 09/23/20 1156      SLP LONG TERM GOAL #1   Title pt will demo abdominal breathing in 8 minutes simple-mod complex conversation without s/sx VCD in 3 sessions    Time 8    Period Weeks   or 17 total visits, for all LTGS   Status On-going      SLP LONG TERM GOAL #2   Title pt will report doing relaxation strategies effective to shorten cough duration and/or cough severity during a vocal cord dysfucntion (VCD) episode at least 75% of the time between 3 sessions    Time 8    Period Weeks    Status On-going      SLP LONG TERM GOAL #3   Title pt will report at least 75% reduction in s/sx of VCD compared to pre-ST    Time 8    Period Weeks    Status On-going      SLP LONG TERM GOAL #4   Title pt's Cough Severity Index (SCI) score will decr < 7    Time 8    Period Weeks    Status On-going      SLP LONG TERM GOAL #5   Title pt will report feeling of need to clear throat or clearing throat =/< 3 times during 3 sessions    Time 8    Period Weeks    Status On-going            Plan - 09/23/20 1155    Clinical Impression Statement [name] presents today with decr'd speech/language output due to irritable larynx/cough neuropathy/vocal cord dysfunction. Her Cough Severity Index score was 12. She reports frequent coughing during the day which has increased in teh last four months and this has hindered communication with family and in the community. SLP believes skilled ST is necessary for improving communication by teaching pt techniques to decr cough and throat clear frequency.    Speech Therapy Frequency 2x /  week    Duration 8 weeks   17 visits   Treatment/Interventions Compensatory techniques;Patient/family education;Internal/external aids;SLP instruction and feedback;Other (comment)  Potential to Achieve Goals Good           Patient will benefit from skilled therapeutic intervention in order to improve the following deficits and impairments:   Other voice and resonance disorders    Problem List Patient Active Problem List   Diagnosis Date Noted  . High risk medication use 02/18/2019  . Therapeutic drug monitoring 02/18/2019  . Healthcare maintenance 02/18/2019  . ILD (interstitial lung disease) ([place]) 11/28/2016  . Multiple lung nodules on CT 11/28/2016  . Abnormal laboratory test 11/18/2016  . DDD L spine 11/18/2016  . 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 History of hypothyroidism 11/10/2016  . History of hemochromatosis 11/10/2016  . Pulmonary emphysema ([place]) 08/31/2016  . [name] phenomenon without gangrene 08/31/2016  . Sinusitis, chronic 08/31/2016  . Chronic cough 02/10/2016  . Irritable larynx 02/10/2016  . Stopped smoking with greater than 40 pack year history 02/10/2016  . Chest pain, unspecified 10/06/2010  . Shortness of breath 10/06/2010  . Hypothyroidism   . Hereditary hemochromatosis ([place]) 04/22/2010  . ATRIAL FIBRILLATION 10/20/2008  . [facility] 10/20/2008  . SYNCOPE AND COLLAPSE 10/20/2008    Colon Rueth, Annye Rusk MS, CCC-SLP 09/23/2020, 11:57 AM  [facility] [address] [place], [place], [address] Phone: [phone]   Fax:  [phone]   Name: [name] MRN: [id number] Date of Birth: June 24, 1941

## 2020-09-23 NOTE — Patient Instructions (Signed)
   Eliminate throat clearing - instead, take a sip of water with a hard swallow, use a forceful "Huh" blow then hard swallow  Have your family help you become aware of clearing your throat  After urge to throat clear, go into abdominal sniff blow  During a coughing episode, try to use the sniff blow to stop the cough - it may be helpful to use an audible "sh" to exhale  Practice abdominal sniff-blow throughout the day, ideally use this when you are in conversation. You may benefit from a sticky note reminders "sniff-blow"  Pre-treat the VCD before you go to church or have conversations  Try not to talk over loud noises, TV, appliances as much as possible to avoid straining your throat  Dr. Mila Homer at [facility]) voice clinic

## 2020-09-28 ENCOUNTER — Other Ambulatory Visit: Payer: Self-pay

## 2020-09-28 ENCOUNTER — Ambulatory Visit: Payer: Medicare Other | Attending: Internal Medicine | Admitting: Speech Pathology

## 2020-09-28 ENCOUNTER — Encounter: Payer: Self-pay | Admitting: Speech Pathology

## 2020-09-28 DIAGNOSIS — R498 Other voice and resonance disorders: Secondary | ICD-10-CM | POA: Insufficient documentation

## 2020-09-28 NOTE — Patient Instructions (Signed)
    Sniff blow with your belly breathing throughout the day    When you are coughing and using sniff-blow, try not to talk until coughing/tickle has passed -you want to reduce any vocal fold spasm or vibration until the episode is over  Breathe2Relax app  This week:  Keep working on eliminating your throat clears - great job  Focus on belly sniff blow in conversation  - If you are having trouble remembering, put a loose rubber band on your hand to help you remember  Practice sniff blow as best you can when you are coughing to try to get control and reduce the cough  Remember to "pre-treat" the cough with sniff-blow/slurp before you go to church, [employer], have a long conversation, out to eat or make a phone call

## 2020-09-28 NOTE — Therapy (Signed)
[facility] [address] [place], [place], [address] Phone: [phone]   Fax:  [phone]  Speech Language Pathology Treatment  Patient Details  Name: [name] MRN: [id number] Date of Birth: June 03, 1941 Referring Provider (SLP): Dennie Fetters, MD   Encounter Date: 09/28/2020   End of Session - 09/28/20 0937    Visit Number 3    Number of Visits 17    Date for SLP Re-Evaluation 12/13/20    SLP Start Time 0853   pt arrived late   SLP Stop Time  0930    SLP Time Calculation (min) 37 min    Activity Tolerance Patient tolerated treatment well           Past Medical History:  Diagnosis Date  . Atrial fibrillation ([place])    a. Flecainide therapy;  b. event monitor 4/12  . Chest pain    a. GXT myoview 4/12: no isch., EF 86%;   b. echo 4/12: EF 55-65%, grade 1 diast dysfxn, LAE  . Depression with anxiety   . GERD (gastroesophageal reflux disease)   . 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 Beryle Beams  . History of syncope   . Hypothyroidism     Past Surgical History:  Procedure Laterality Date  . MOUTH SURGERY    . SHOULDER ARTHROSCOPY Left   . TOTAL ABDOMINAL HYSTERECTOMY    . VARICOSE VEIN SURGERY      There were no vitals filed for this visit.   Subjective Assessment - 09/28/20 0856    Subjective "It's about the same"    Currently in Pain? Yes    Pain Score 3     Pain Location Leg    Pain Orientation Right;Left    Pain Descriptors / Indicators Aching    Pain Type Chronic pain                 ADULT SLP TREATMENT - 09/28/20 0857      General Information   Behavior/Cognition Alert;Cooperative;Pleasant mood      Treatment Provided   Treatment provided Cognitive-Linquistic      Cognitive-Linquistic Treatment   Treatment focused on Voice;Patient/family/caregiver education    Skilled Treatment "I don't even know I'm doing it" re: throat clears. She has told  her daughters and they are helping her ID it. She reports using the "slurp" with pursed lips in church to reduce coughing. She reports coughing episodes have stayed about the same. She has not practiced abdominal sniff blow breathing with other coughing episodes. She demonstrated abdominal sniff-blow in conversation with occasional min A and consistent modeling. Employed hand on belly as external reminder. Instructed her to carryover sniff blow in all conversations and use external aid to remember to tdo this as needed. One couging episode this sessoin, Pat required usual mod A to begin sniff-blow and verbal cues to not talk until cough in under control. Today, she had 3 throat clears (down from 10+ last session) and required verbal and visual cues to ID throat clears.      Assessment / Recommendations / Plan   Plan Continue with current plan of care      Progression Toward Goals   Progression toward goals Progressing toward goals            SLP Education - 09/28/20 0934    Education Details eliminte throat clears, pre treat VCD, abdominal sniff blow in conversations and through out the day; practice relaxation  SLP Short Term Goals - 09/28/20 0936      SLP SHORT TERM GOAL #1   Title pt will demo abdominal breathing 80% of the time at rest in 2 sessions    Baseline 09/23/20; 09/28/20    Time 3    Period Weeks   or 9 visits, for all STGs   Status Achieved      SLP SHORT TERM GOAL #2   Title pt will demo abdominal breathing 75% in simple sentence responses x2 sessions    Baseline 09/28/20;    Time 3    Period Weeks    Status On-going      SLP SHORT TERM GOAL #3   Title pt will report relaxation strategies effective to shorten cough duration and/or cough severity 50% of the time during a vocal cord dysfucntion (VCD) episode    Time 3    Period Weeks    Status On-going      SLP SHORT TERM GOAL #4   Title pt will report 50% decr in VCD s/sx compared to pre-ST    Time 3     Period Weeks    Status On-going            SLP Long Term Goals - 09/28/20 4193      SLP LONG TERM GOAL #1   Title pt will demo abdominal breathing in 8 minutes simple-mod complex conversation without s/sx VCD in 3 sessions    Time 7    Period Weeks   or 17 total visits, for all LTGS   Status On-going      SLP LONG TERM GOAL #2   Title pt will report doing relaxation strategies effective to shorten cough duration and/or cough severity during a vocal cord dysfucntion (VCD) episode at least 75% of the time between 3 sessions    Time 7    Period Weeks    Status On-going      SLP LONG TERM GOAL #3   Title pt will report at least 75% reduction in s/sx of VCD compared to pre-ST    Time 7    Period Weeks    Status On-going      SLP LONG TERM GOAL #4   Title pt's Cough Severity Index (SCI) score will decr < 7    Time 7    Period Weeks    Status On-going      SLP LONG TERM GOAL #5   Title pt will report feeling of need to clear throat or clearing throat =/< 3 times during 3 sessions    Time 8    Period Weeks    Status On-going            Plan - 09/28/20 0935    Clinical Impression Statement [name] presents today with decr'd speech/language output due to irritable larynx/cough neuropathy/vocal cord dysfunction. Her Cough Severity Index score was 12. She reports frequent coughing during the day which has increased in teh last four months and this has hindered communication with family and in the community. SLP believes skilled ST is necessary for improving communication by teaching pt techniques to decr cough and throat clear frequency.    Speech Therapy Frequency 2x / week    Duration 8 weeks   17 visits   Treatment/Interventions Compensatory techniques;Patient/family education;Internal/external aids;SLP instruction and feedback;Other (comment)    Potential to Achieve Goals Good           Patient will benefit from skilled therapeutic intervention in order  to  improve the following deficits and impairments:   Other voice and resonance disorders    Problem List Patient Active Problem List   Diagnosis Date Noted  . High risk medication use 02/18/2019  . Therapeutic drug monitoring 02/18/2019  . Healthcare maintenance 02/18/2019  . ILD (interstitial lung disease) ([place]) 11/28/2016  . Multiple lung nodules on CT 11/28/2016  . Abnormal laboratory test 11/18/2016  . DDD L spine 11/18/2016  . 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 History of hypothyroidism 11/10/2016  . History of hemochromatosis 11/10/2016  . Pulmonary emphysema ([place]) 08/31/2016  . [name] phenomenon without gangrene 08/31/2016  . Sinusitis, chronic 08/31/2016  . Chronic cough 02/10/2016  . Irritable larynx 02/10/2016  . Stopped smoking with greater than 40 pack year history 02/10/2016  . Chest pain, unspecified 10/06/2010  . Shortness of breath 10/06/2010  . Hypothyroidism   . Hereditary hemochromatosis ([place]) 04/22/2010  . ATRIAL FIBRILLATION 10/20/2008  . [facility] 10/20/2008  . SYNCOPE AND COLLAPSE 10/20/2008    [name], Annye Rusk MS, CCC-SLP 09/28/2020, 9:38 AM  [facility] [address] [place], [place], [address] Phone: [phone]   Fax:  [phone]   Name: [name] MRN: [id number] Date of Birth: [birth year]

## 2020-09-30 ENCOUNTER — Ambulatory Visit: Payer: Medicare Other | Admitting: Speech Pathology

## 2020-09-30 ENCOUNTER — Encounter: Payer: Self-pay | Admitting: Speech Pathology

## 2020-09-30 ENCOUNTER — Other Ambulatory Visit: Payer: Self-pay

## 2020-09-30 DIAGNOSIS — R498 Other voice and resonance disorders: Secondary | ICD-10-CM

## 2020-09-30 NOTE — Patient Instructions (Addendum)
   Pre-treat your cough/spasm before going over your friend's house tonight and driving there  Use sniff-blow even when you feel the urge to clear your throat  Keep water with you - great job  When you have a coughing episode and are trying to control it with sniff blow, stop talking - you want to eliminate any vocal fold vibration, including talking, until the sensation has passed   Be mindful of relaxing your shoulders down when you are trying to pre-treat a tickle or stop a cough - keep your neck and shoulders relaxed  Breathe2Relax app - try it and see what you think for relaxation. Remember to practice this so you can add relaxation to your tool bag

## 2020-09-30 NOTE — Therapy (Signed)
[facility] [address] [place], [place], [address] Phone: [phone]   Fax:  [phone]  Speech Language Pathology Treatment  Patient Details  Name: [name] MRN: [id number] Date of Birth: [birth year] Referring Provider (SLP): Dennie Fetters, MD   Encounter Date: 09/30/2020   End of Session - 09/30/20 1508    Visit Number 4    Number of Visits 17    Date for SLP Re-Evaluation 12/13/20    SLP Start Time 0847    SLP Stop Time  0928    SLP Time Calculation (min) 41 min    Activity Tolerance Patient tolerated treatment well           Past Medical History:  Diagnosis Date  . Atrial fibrillation ([place])    a. Flecainide therapy;  b. event monitor 4/12  . Chest pain    a. GXT myoview 4/12: no isch., EF 86%;   b. echo 4/12: EF 55-65%, grade 1 diast dysfxn, LAE  . Depression with anxiety   . GERD (gastroesophageal reflux disease)   . 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 Beryle Beams  . History of syncope   . Hypothyroidism     Past Surgical History:  Procedure Laterality Date  . MOUTH SURGERY    . SHOULDER ARTHROSCOPY Left   . TOTAL ABDOMINAL HYSTERECTOMY    . VARICOSE VEIN SURGERY      There were no vitals filed for this visit.   Subjective Assessment - 09/30/20 0853    Subjective "I have been trying to do the breathing during my coughing spasms, I think it is helping"    Currently in Pain? No/denies                 ADULT SLP TREATMENT - 09/30/20 0854      General Information   Behavior/Cognition Alert;Cooperative;Pleasant mood      Treatment Provided   Treatment provided Cognitive-Linquistic      Cognitive-Linquistic Treatment   Treatment focused on Voice;Patient/family/caregiver education    Skilled Treatment [name] has not yet practiced relaxation, provided her an app, Music therapist. She had a coughing episode upon entering the room- with verbal cues  and modeling she was able to stop cough with abdominal sniff blow and min cues to not talk until sensation of cough has passed. She is going out with friends tonight, instructed her to sniff-blow prior to leaving the house and on her drive there. 2nd coughing episode, with min verbal cues, [name] ceased coughing after 2 coughs using abdominal sniff blow and 1 cues to relax her shoulders and neck. Targeted consistent use of abdominal sniff blow in simple, short conversations today. Initially, [name] required usual min verbal and visual cues as well as consistent modeling. As session progressed, she improved to occasional min A to rare min A. No throat clears today      Assessment / Recommendations / Plan   Plan Continue with current plan of care      Progression Toward Goals   Progression toward goals Progressing toward goals            SLP Education - 09/30/20 1504    Education Details relaxation, shoulders down, pre treat VCD, continue practice in conversation    Person(s) Educated Patient    Methods Explanation;Demonstration;Verbal cues;Handout    Comprehension Verbalized understanding;Returned demonstration;Verbal cues required            SLP Short Term Goals - 09/30/20 1507  SLP SHORT TERM GOAL #1   Title pt will demo abdominal breathing 80% of the time at rest in 2 sessions    Baseline 09/23/20; 09/28/20    Time 3    Period Weeks   or 9 visits, for all STGs   Status Achieved      SLP SHORT TERM GOAL #2   Title pt will demo abdominal breathing 75% in simple sentence responses x2 sessions    Baseline 09/28/20;    Time 3    Period Weeks    Status Achieved      SLP SHORT TERM GOAL #3   Title pt will report relaxation strategies effective to shorten cough duration and/or cough severity 50% of the time during a vocal cord dysfucntion (VCD) episode    Time 3    Period Weeks    Status On-going      SLP SHORT TERM GOAL #4   Title pt will report 50% decr in VCD s/sx compared to  pre-ST    Time 3    Period Weeks    Status On-going            SLP Long Term Goals - 09/30/20 1508      SLP LONG TERM GOAL #1   Title pt will demo abdominal breathing in 8 minutes simple-mod complex conversation without s/sx VCD in 3 sessions    Time 7    Period Weeks   or 17 total visits, for all LTGS   Status On-going      SLP LONG TERM GOAL #2   Title pt will report doing relaxation strategies effective to shorten cough duration and/or cough severity during a vocal cord dysfucntion (VCD) episode at least 75% of the time between 3 sessions    Time 7    Period Weeks    Status On-going      SLP LONG TERM GOAL #3   Title pt will report at least 75% reduction in s/sx of VCD compared to pre-ST    Time 7    Period Weeks    Status On-going      SLP LONG TERM GOAL #4   Title pt's Cough Severity Index (SCI) score will decr < 7    Time 7    Period Weeks    Status On-going      SLP LONG TERM GOAL #5   Title pt will report feeling of need to clear throat or clearing throat =/< 3 times during 3 sessions    Time 8    Period Weeks    Status On-going            Plan - 09/30/20 1505    Clinical Impression Statement [name] is using abdominal sniff blow with moderate success in controling VCD symptoms. She is using abdominal sniff blow in conversation with min A. She continues to report couging episodes, however she is having success using strategies to stop cough. Frequent coughing hinders communication and skilled ST recommended for ongoing training to decrease/eliminate VCD cough to improve communication    Speech Therapy Frequency 2x / week    Duration 8 weeks   17 visits   Treatment/Interventions Compensatory techniques;Patient/family education;Internal/external aids;SLP instruction and feedback;Other (comment)    Potential to Achieve Goals Good           Patient will benefit from skilled therapeutic intervention in order to improve the following deficits and impairments:    Other voice and resonance disorders    Problem List Patient Active  Problem List   Diagnosis Date Noted  . High risk medication use 02/18/2019  . Therapeutic drug monitoring 02/18/2019  . Healthcare maintenance 02/18/2019  . ILD (interstitial lung disease) ([place]) 11/28/2016  . Multiple lung nodules on CT 11/28/2016  . Abnormal laboratory test 11/18/2016  . DDD L spine 11/18/2016  . 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 History of hypothyroidism 11/10/2016  . History of hemochromatosis 11/10/2016  . Pulmonary emphysema ([place]) 08/31/2016  . [name] phenomenon without gangrene 08/31/2016  . Sinusitis, chronic 08/31/2016  . Chronic cough 02/10/2016  . Irritable larynx 02/10/2016  . Stopped smoking with greater than 40 pack year history 02/10/2016  . Chest pain, unspecified 10/06/2010  . Shortness of breath 10/06/2010  . Hypothyroidism   . Hereditary hemochromatosis ([place]) 04/22/2010  . ATRIAL FIBRILLATION 10/20/2008  . [facility] 10/20/2008  . SYNCOPE AND COLLAPSE 10/20/2008    Hailley Byers, Annye Rusk MS, [facility] SLP 09/30/2020, 3:08 PM  [facility] [address] [place], [place], [address] Phone: [phone]   Fax:  [phone]   Name: [name] MRN: [id number] Date of Birth: 13-Jan-1941

## 2020-10-05 ENCOUNTER — Ambulatory Visit: Payer: Medicare Other | Admitting: Speech Pathology

## 2020-10-05 ENCOUNTER — Other Ambulatory Visit: Payer: Self-pay

## 2020-10-05 ENCOUNTER — Encounter: Payer: Self-pay | Admitting: Speech Pathology

## 2020-10-05 DIAGNOSIS — R498 Other voice and resonance disorders: Secondary | ICD-10-CM

## 2020-10-05 NOTE — Therapy (Signed)
[facility] [address] [place], [place], [address] Phone: [phone]   Fax:  [phone]  Speech Language Pathology Treatment  Patient Details  Name: [name] MRN: [id number] Date of Birth: 12-04-1940 Referring Provider (SLP): Dennie Fetters, MD   Encounter Date: 10/05/2020   End of Session - 10/05/20 0935    Visit Number 5    Number of Visits 17    Date for SLP Re-Evaluation 12/13/20    SLP Start Time 0846    SLP Stop Time  0928    SLP Time Calculation (min) 42 min    Activity Tolerance Patient tolerated treatment well           Past Medical History:  Diagnosis Date  . Atrial fibrillation ([place])    a. Flecainide therapy;  b. event monitor 4/12  . Chest pain    a. GXT myoview 4/12: no isch., EF 86%;   b. echo 4/12: EF 55-65%, grade 1 diast dysfxn, LAE  . Depression with anxiety   . GERD (gastroesophageal reflux disease)   . 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 Beryle Beams  . History of syncope   . Hypothyroidism     Past Surgical History:  Procedure Laterality Date  . MOUTH SURGERY    . SHOULDER ARTHROSCOPY Left   . TOTAL ABDOMINAL HYSTERECTOMY    . VARICOSE VEIN SURGERY      There were no vitals filed for this visit.   Subjective Assessment - 10/05/20 0853    Subjective "I had to use it in church and it did help"    Currently in Pain? No/denies                 ADULT SLP TREATMENT - 10/05/20 0854      General Information   Behavior/Cognition Alert;Cooperative;Pleasant mood      Treatment Provided   Treatment provided Cognitive-Linquistic      Cognitive-Linquistic Treatment   Treatment focused on Voice;Patient/family/caregiver education    Skilled Treatment [name] endorses using abdominal sniff blow in church to prevent coughing epidsode with success. She also has "pre-treated" VCD using abdominal sniff-blow on the way to visits her friends and did  not have coughing episoded with her friends. [name] reports  about 4 couhing episodes a week where she can't get control with breathing. Trained in using 2-3 deep rapid sniffs, followed by full exhale blow. She demonstrated this with rare min A. She does not identify a trigger for her more severe couging episodes, however she does endorse talking for a long time or laughing can trigger cough. Today, we focused on using abdominal sniff-blow in conversation, with rare min A. She did not practice this since last visit. Focus this week will be practice sniff blow when it is her turn to listen in conversation. 3 throat clears - Pat ID'd 2, required A to ID 1/3. She has practiced relaxation and breathing with app with success. Instructed her to practice this consistently so she can go into relaxed state when she needs to prevent or cease coughing. 2 coughing episodes began today, with rare min A Pat used breathing techniques to stop coughing episode after 1-3 coughs. We discussed this is harder outside of therapy due to distractions. She did cease talking when she was breathing to stop cough with supervision cues.      Assessment / Recommendations / Plan   Plan Continue with current plan of care  Progression Toward Goals   Progression toward goals Progressing toward goals            SLP Education - 10/05/20 0933    Education Details relaxation, sniff blow when it is your turn to listen in conversation    Person(s) Educated Patient    Methods Explanation;Demonstration;Verbal cues;Handout    Comprehension Verbalized understanding;Returned demonstration;Verbal cues required            SLP Short Term Goals - 10/05/20 0934      SLP SHORT TERM GOAL #1   Title pt will demo abdominal breathing 80% of the time at rest in 2 sessions    Baseline 09/23/20; 09/28/20    Time 3    Period Weeks   or 9 visits, for all STGs   Status Achieved      SLP SHORT TERM GOAL #2   Title pt will demo abdominal breathing  75% in simple sentence responses x2 sessions    Baseline 09/28/20;    Time 3    Period Weeks    Status Achieved      SLP SHORT TERM GOAL #3   Title pt will report relaxation strategies effective to shorten cough duration and/or cough severity 50% of the time during a vocal cord dysfucntion (VCD) episode    Time 2    Period Weeks    Status On-going      SLP SHORT TERM GOAL #4   Title pt will report 50% decr in VCD s/sx compared to pre-ST    Time 2    Period Weeks    Status On-going            SLP Long Term Goals - 10/05/20 0934      SLP LONG TERM GOAL #1   Title pt will demo abdominal breathing in 8 minutes simple-mod complex conversation without s/sx VCD in 3 sessions    Baseline 10/05/20    Time 6    Period Weeks   or 17 total visits, for all LTGS   Status On-going      SLP LONG TERM GOAL #2   Title pt will report doing relaxation strategies effective to shorten cough duration and/or cough severity during a vocal cord dysfucntion (VCD) episode at least 75% of the time between 3 sessions    Time 6    Period Weeks    Status On-going      SLP LONG TERM GOAL #3   Title pt will report at least 75% reduction in s/sx of VCD compared to pre-ST    Time 6    Period Weeks    Status On-going      SLP LONG TERM GOAL #4   Title pt's Cough Severity Index (SCI) score will decr < 7    Time 6    Period Weeks    Status On-going      SLP LONG TERM GOAL #5   Title pt will report feeling of need to clear throat or clearing throat =/< 3 times during 3 sessions    Baseline 09/30/20; 10/05/20    Time 6    Period Weeks    Status On-going            Plan - 10/05/20 0933    Clinical Impression Statement [name] is using abdominal sniff blow with moderate success in controling VCD symptoms. She is using abdominal sniff blow in conversation with min A. She continues to report couging episodes, however she is having success using strategies to  stop cough. Frequent coughing hinders communication  and skilled ST recommended for ongoing training to decrease/eliminate VCD cough to improve communication    Speech Therapy Frequency 2x / week    Duration 8 weeks   17 visits   Treatment/Interventions Compensatory techniques;Patient/family education;Internal/external aids;SLP instruction and feedback;Other (comment)    Potential to Achieve Goals Good           Patient will benefit from skilled therapeutic intervention in order to improve the following deficits and impairments:   Other voice and resonance disorders    Problem List Patient Active Problem List   Diagnosis Date Noted  . High risk medication use 02/18/2019  . Therapeutic drug monitoring 02/18/2019  . Healthcare maintenance 02/18/2019  . ILD (interstitial lung disease) ([place]) 11/28/2016  . Multiple lung nodules on CT 11/28/2016  . Abnormal laboratory test 11/18/2016  . DDD L spine 11/18/2016  . 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 History of hypothyroidism 11/10/2016  . History of hemochromatosis 11/10/2016  . Pulmonary emphysema ([place]) 08/31/2016  . [name] phenomenon without gangrene 08/31/2016  . Sinusitis, chronic 08/31/2016  . Chronic cough 02/10/2016  . Irritable larynx 02/10/2016  . Stopped smoking with greater than 40 pack year history 02/10/2016  . Chest pain, unspecified 10/06/2010  . Shortness of breath 10/06/2010  . Hypothyroidism   . Hereditary hemochromatosis ([place]) 04/22/2010  . ATRIAL FIBRILLATION 10/20/2008  . [facility] 10/20/2008  . SYNCOPE AND COLLAPSE 10/20/2008    [name], Tara Rusk MS, CCC-SLP 10/05/2020, 9:36 AM  [facility] [address] [place], [place], [address] Phone: [phone]   Fax:  [phone]   Name: JAVONDA SUH MRN: [id number] Date of Birth: [birth year]

## 2020-10-05 NOTE — Patient Instructions (Signed)
   When you are having a more aggressive coughing episode and can't get control with abdominal sniff-blow, Try 2-3 deep rapid sniffs then blow, fully exhaling   This week: Practice sniff-blow when you are in a conversation and it is your turn to listen  Great job working on stopping clearing your throat - remind your family to let you know when you clear  Keep practicing relaxation with breathing so that when you have a bad coughing spell, you are familiar with how to get into relaxation quickly  Emotions and stress can also trigger a cough - just for you to be aware of that  Laughter also triggers it - again, try to sniff blow during or after your laugh

## 2020-10-07 ENCOUNTER — Ambulatory Visit: Payer: Medicare Other | Admitting: Speech Pathology

## 2020-10-12 ENCOUNTER — Other Ambulatory Visit: Payer: Self-pay

## 2020-10-12 ENCOUNTER — Ambulatory Visit: Payer: Medicare Other

## 2020-10-12 DIAGNOSIS — R498 Other voice and resonance disorders: Secondary | ICD-10-CM | POA: Diagnosis not present

## 2020-10-12 NOTE — Therapy (Signed)
[facility] [address] [place], [place], [address] Phone: [phone]   Fax:  [phone]  Speech Language Pathology Treatment  Patient Details  Name: [name] MRN: [id number] Date of Birth: 1941-01-23 Referring Provider (SLP): Dennie Fetters, MD   Encounter Date: 10/12/2020   End of Session - 10/12/20 1244    Visit Number 6    Number of Visits 17    Date for SLP Re-Evaluation 12/13/20    SLP Start Time 1146    SLP Stop Time  1221    SLP Time Calculation (min) 35 min    Activity Tolerance Patient tolerated treatment well           Past Medical History:  Diagnosis Date  . Atrial fibrillation ([place])    a. Flecainide therapy;  b. event monitor 4/12  . Chest pain    a. GXT myoview 4/12: no isch., EF 86%;   b. echo 4/12: EF 55-65%, grade 1 diast dysfxn, LAE  . Depression with anxiety   . GERD (gastroesophageal reflux disease)   . 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 Beryle Beams  . History of syncope   . Hypothyroidism     Past Surgical History:  Procedure Laterality Date  . MOUTH SURGERY    . SHOULDER ARTHROSCOPY Left   . TOTAL ABDOMINAL HYSTERECTOMY    . VARICOSE VEIN SURGERY      There were no vitals filed for this visit.   Subjective Assessment - 10/12/20 1150    Subjective "I used (sniff-blow) when I feel the tickle coming." "It's woking, and I'm training myself to continue to do (the techniques)."    Currently in Pain? No/denies                 ADULT SLP TREATMENT - 10/12/20 1150      General Information   Behavior/Cognition Alert;Cooperative;Pleasant mood      Treatment Provided   Treatment provided Cognitive-Linquistic      Cognitive-Linquistic Treatment   Treatment focused on Voice;Patient/family/caregiver education    Skilled Treatment [name] states that she does "spaghetti straw" and exhale exercise instead of sniff-blow as she gets syncopal  with repeated sniff-blow. [name] reports she "always has water nearby", and does spaghetti straw/exhale when or if she has coughing episode/s. She is tryingt to focus on abdominal breathing (AB), being more selective about when to talk, and is doing relaxation strategies in conversation when the conversational partner is talking and states each of these have assisted pt in reducing coughing episodes this week and tells SLP that roughly 50% reduction in overt s/sx vocal cord dysfunction (VCD) has been demonstrated so far. Feeling abdominal breathing during those times of an episode "coming on" has also been helpful. She tells SLP she feels AB throughout the day, and that she hasn't woken up at night in over a week. SLP reviewed some tips for GERD with pt. She is very pleased with progress thus far (see "S" statement) and wants to check in in approx 3 weeks for her next visit.      Assessment / Recommendations / Plan   Plan Continue with current plan of care   likely d/c next session after a "check-in" session     Progression Toward Goals   Progression toward goals Progressing toward goals            SLP Education - 10/12/20 1244    Education Details GERD tips    Person(s)  Educated Patient    Methods Explanation    Comprehension Verbalized understanding            SLP Short Term Goals - 10/12/20 1211      SLP SHORT TERM GOAL #1   Title pt will demo abdominal breathing 80% of the time at rest in 2 sessions    Baseline 09/23/20; 09/28/20    Period --   or 9 visits, for all STGs   Status Achieved      SLP SHORT TERM GOAL #2   Title pt will demo abdominal breathing 75% in simple sentence responses x2 sessions    Baseline 09/28/20;    Status Achieved      SLP SHORT TERM GOAL #3   Title pt will report relaxation strategies effective to shorten cough duration and/or cough severity 50% of the time during a vocal cord dysfucntion (VCD) episode    Status Achieved      SLP SHORT TERM GOAL #4    Title pt will report 50% decr in VCD s/sx compared to pre-ST    Status Achieved            SLP Long Term Goals - 10/12/20 1246      SLP LONG TERM GOAL #1   Title pt will demo abdominal breathing in 8 minutes simple-mod complex conversation without s/sx VCD in 3 sessions    Baseline 10/05/20, 10/12/20    Time 5    Period Weeks   or 17 total visits, for all LTGS   Status On-going      SLP LONG TERM GOAL #2   Title pt will report doing relaxation strategies effective to shorten cough duration and/or cough severity during a vocal cord dysfucntion (VCD) episode at least 75% of the time between 3 sessions    Baseline 10-12-20    Time 5    Period Weeks    Status On-going      SLP LONG TERM GOAL #3   Title pt will report at least 75% reduction in s/sx of VCD compared to pre-ST    Time 5    Period Weeks    Status On-going      SLP LONG TERM GOAL #4   Title pt's Cough Severity Index (SCI) score will decr < 7    Time 5    Period Weeks    Status On-going      SLP LONG TERM GOAL #5   Title pt will report feeling of need to clear throat or clearing throat =/< 3 times during 3 sessions    Baseline 09/30/20; 10/05/20    Status Achieved            Plan - 10/12/20 1244    Clinical Impression Statement [name] is using abdominal sniff blow with some success in controling VCD symptoms, as well as "spaghetti straw inhale" wiht exhale, and feeling abdominal breathing (AB) throughout the day. She reports 50% reduction in VCD s/sx since eval. One throat clear today. She continues to report couging episodes,and reports cont'd success using strategies to stop cough. Frequent coughing hinders communication and skilled ST recommended for ongoing training to decrease/eliminate VCD cough to improve communication. Likely d/c next session, as pt is pleased with progress thus far.    Speech Therapy Frequency 2x / week    Duration 8 weeks   17 visits   Treatment/Interventions Compensatory  techniques;Patient/family education;Internal/external aids;SLP instruction and feedback;Other (comment)    Potential to Achieve Goals Good  Patient will benefit from skilled therapeutic intervention in order to improve the following deficits and impairments:   Other voice and resonance disorders    Problem List Patient Active Problem List   Diagnosis Date Noted  . High risk medication use 02/18/2019  . Therapeutic drug monitoring 02/18/2019  . Healthcare maintenance 02/18/2019  . ILD (interstitial lung disease) ([place]) 11/28/2016  . Multiple lung nodules on CT 11/28/2016  . Abnormal laboratory test 11/18/2016  . DDD L spine 11/18/2016  . 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 History of hypothyroidism 11/10/2016  . History of hemochromatosis 11/10/2016  . Pulmonary emphysema ([place]) 08/31/2016  . [name] phenomenon without gangrene 08/31/2016  . Sinusitis, chronic 08/31/2016  . Chronic cough 02/10/2016  . Irritable larynx 02/10/2016  . Stopped smoking with greater than 40 pack year history 02/10/2016  . Chest pain, unspecified 10/06/2010  . Shortness of breath 10/06/2010  . Hypothyroidism   . Hereditary hemochromatosis ([employer]) 04/22/2010  . ATRIAL FIBRILLATION 10/20/2008  . [facility] 10/20/2008  . SYNCOPE AND COLLAPSE 10/20/2008    [facility] ,[place], CCC-SLP  10/12/2020, 12:47 PM  [facility] [address] [place], [place], [address] Phone: [phone]   Fax:  [phone]   Name: JARIAH TARKOWSKI MRN: [id number] Date of Birth: 1940-10-28

## 2020-10-14 ENCOUNTER — Ambulatory Visit: Payer: Medicare Other

## 2020-10-19 ENCOUNTER — Ambulatory Visit: Payer: Medicare Other

## 2020-10-26 ENCOUNTER — Ambulatory Visit: Payer: Medicare Other

## 2020-11-02 ENCOUNTER — Other Ambulatory Visit: Payer: Self-pay | Admitting: Family Medicine

## 2020-11-02 ENCOUNTER — Ambulatory Visit: Payer: Medicare Other

## 2020-11-02 DIAGNOSIS — Z1231 Encounter for screening mammogram for malignant neoplasm of breast: Secondary | ICD-10-CM

## 2020-11-10 ENCOUNTER — Other Ambulatory Visit: Payer: Self-pay | Admitting: Internal Medicine

## 2020-11-13 ENCOUNTER — Other Ambulatory Visit: Payer: Self-pay | Admitting: Internal Medicine

## 2020-11-24 NOTE — Progress Notes (Signed)
Office Visit Note  Patient: [name]             Date of Birth: 06-02-1941           MRN: [id number]             PCP: Kathyrn Lass, MD Referring: Kathyrn Lass, MD Visit Date: 12/08/2020 Occupation: @GUAROCC @  Subjective:  Discuss DEXA results   History of Present Illness: [name] is a 80 y.o. female with history of scleroderma, ILD, and osteoarthritis.  Patient remains on mycophenolate, Bactrim, and prednisone as prescribed by Dr. Chase Caller.  Cording to the patient she has not noticed any new or worsening pulmonary symptoms.  She continues to have a cough intermittently.  She attended pulmonary rehab which was helpful in the past.  She has an upcoming appointment with Dr. Chase Caller on 12/31/2020. She continues to experience intermittent symptoms of [name].  She denies any rashes on her skin.  She has not had any dysphagia or constipation.  She experiences occasional myalgias and muscle tenderness in bilateral lower extremities which she attributes to fibromyalgia.  She requested a refill of methocarbamol which has been effective at managing her symptoms.  She denies any increased joint pain or joint swelling at this time. She denies any recent infections.  She is planning on receiving the 2nd covid-19 booster dose soon.  She presented today to discuss DEXA results.  According to the patient her PCP sent a prescription in for Fosamax but she did not start due to the apprehension of possible side effects.  She has been taking a vitamin D supplement daily and has been getting calcium from her diet.   Activities of Daily Living:  Patient reports morning stiffness for less than 5 minutes.   Patient Reports nocturnal pain.  Difficulty dressing/grooming: Denies Difficulty climbing stairs: Denies Difficulty getting out of chair: Denies Difficulty using hands for taps, buttons, cutlery, and/or writing: Denies  Review of Systems  Constitutional:  Negative for fatigue.  HENT:   Positive for mouth dryness. Negative for mouth sores and nose dryness.   Eyes:  Negative for pain, itching and dryness.  Respiratory:  Negative for shortness of breath and difficulty breathing.   Cardiovascular:  Negative for chest pain and palpitations.  Gastrointestinal:  Negative for blood in stool, constipation and diarrhea.  Endocrine: Negative for increased urination.  Genitourinary:  Negative for difficulty urinating.  Musculoskeletal:  Positive for joint pain, joint pain, myalgias, morning stiffness and myalgias. Negative for joint swelling and muscle tenderness.  Skin:  Negative for color change, rash and redness.  Allergic/Immunologic: Negative for susceptible to infections.  Neurological:  Negative for dizziness, numbness, headaches, memory loss and weakness.  Hematological:  Positive for bruising/bleeding tendency.  Psychiatric/Behavioral:  Negative for confusion.    PMFS History:  Patient Active Problem List   Diagnosis Date Noted   High risk medication use 02/18/2019   Therapeutic drug monitoring 02/18/2019   Healthcare maintenance 02/18/2019   ILD (interstitial lung disease) ([place]) 11/28/2016   Multiple lung nodules on CT 11/28/2016   Abnormal laboratory test 11/18/2016   DDD L spine 11/18/2016   Scl-70 antibody positive 11/10/2016   History of hypothyroidism 11/10/2016   History of hemochromatosis 11/10/2016   Pulmonary emphysema ([place]) 08/31/2016   [name] phenomenon without gangrene 08/31/2016   Sinusitis, chronic 08/31/2016   Chronic cough 02/10/2016   Irritable larynx 02/10/2016   Stopped smoking with greater than 40 pack year history 02/10/2016   Chest pain, unspecified 10/06/2010  Shortness of breath 10/06/2010   Hypothyroidism    Hereditary hemochromatosis ([place]) 04/22/2010   ATRIAL FIBRILLATION 10/20/2008   PAC 10/20/2008   SYNCOPE AND COLLAPSE 10/20/2008    Past Medical History:  Diagnosis Date   Atrial fibrillation ([place])    a. Flecainide therapy;   b. event monitor 4/12   Chest pain    a. GXT myoview 4/12: no isch., EF 86%;   b. 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 Beryle Beams   History of syncope    Hypothyroidism     Family History  Problem Relation Age of Onset   Stroke Mother    Heart disease Father 20   Hemochromatosis Brother    Past Surgical History:  Procedure Laterality Date   MOUTH SURGERY     SHOULDER ARTHROSCOPY Left    TOTAL ABDOMINAL HYSTERECTOMY     VARICOSE VEIN SURGERY     Social History   Social History Narrative   REGULAR EXERCISE   Immunization History  Administered Date(s) Administered   Fluad Quad(high Dose 65+) 02/18/2019   Influenza Split 03/27/2019   Influenza, High Dose Seasonal PF 08/29/2017, 04/26/2018, 04/29/2020   PFIZER(Purple Top)SARS-COV-2 Vaccination 07/22/2019, 08/12/2019, 03/11/2020   Pneumococcal Conjugate-13 06/12/2014   Pneumococcal Polysaccharide-23 06/27/2010, 04/25/2018   Tdap 03/16/2011, 07/05/2019   Zoster Recombinat (Shingrix) 04/08/2019   Zoster, Live 06/27/2010, 04/08/2019     Objective: Vital Signs: BP (!) 102/57 (BP Location: Left Arm, Patient Position: Sitting, Cuff Size: Normal)   Pulse (!) 59   Resp 14   Ht 5' 3.5" (1.613 m)   Wt 145 lb 3.2 oz (65.9 kg)   BMI 25.32 kg/m    Physical Exam Vitals and nursing note reviewed.  Constitutional:      Appearance: She is well-developed.  HENT:     Head: Normocephalic and atraumatic.  Eyes:     Conjunctiva/sclera: Conjunctivae normal.  Pulmonary:     Effort: Pulmonary effort is normal.  Abdominal:     Palpations: Abdomen is soft.  Musculoskeletal:     Cervical back: Normal range of motion.  Skin:    General: Skin is warm and dry.     Capillary Refill: Capillary refill takes less than 2 seconds.     Comments: No sclerodactyly or telangiectasias were noted. She has  mild nailbed capillary dilation.   Neurological:     Mental Status: She is alert and oriented to person, place, and time.  Psychiatric:        Behavior: Behavior normal.     Musculoskeletal Exam: C-spine has slightly limited range of motion with lateral rotation.  Shoulder joints, elbow joints, wrist joints, MCPs, PIPs, DIPs have good range of motion with no synovitis.  Complete fist formation bilaterally.  Hip joints, knee joints, and ankle joints good ROM with no discomfort.  No warmth or effusion of knee joints.  No tenderness or swelling of ankle joints.    CDAI Exam: CDAI Score: -- Patient Global: --; Provider Global: -- Swollen: --; Tender: -- Joint Exam 12/08/2020   No joint exam has been documented for this visit   There is currently no information documented on the homunculus. Go to the Rheumatology activity and complete the homunculus joint exam.  Investigation: No additional findings.  Imaging: No results found.  Recent Labs: Lab Results  Component Value Date   WBC  5.0 08/20/2020   HGB 12.0 08/20/2020   PLT 232.0 08/20/2020   NA 138 08/20/2020   K 3.9 08/20/2020   CL 102 08/20/2020   CO2 28 08/20/2020   GLUCOSE 108 (H) 08/20/2020   BUN 15 08/20/2020   CREATININE 0.78 08/20/2020   BILITOT 0.5 08/20/2020   ALKPHOS 46 08/20/2020   AST 15 08/20/2020   ALT 13 08/20/2020   PROT 6.7 08/20/2020   ALBUMIN 4.2 08/20/2020   CALCIUM 9.4 08/20/2020   GFRAA 101 06/09/2020   QFTBGOLDPLUS NEGATIVE 08/10/2017    Speciality Comments: No specialty comments available.  Procedures:  No procedures performed Allergies: Patient has no known allergies.   Assessment / Plan:     Visit Diagnoses: Scleroderma ([place]): Nailbed capillary changes, anticardiolipin antibody+, Scl-70+, ILD:  She has no signs of sclerodactyly on exam.  She has not developed any new or worsening symptoms since her last office visit.  She continues to have frequent symptoms of [name] but overall the  symptoms have been mild.  No digital ulcerations or signs of gangrene were noted on examination today.  She remains on mycophenolate, Bactrim, and prednisone 5 mg daily for management of ILD.  There is no interval change when comparing her high-resolution chest CT from 2017 to the updated CT in September 2021.  She has an upcoming appointment with Dr. Chase Caller on 12/31/2020. She was advised to notify us if she develops any new or worsening symptoms.  She will follow-up in the office in 5 months.  Anticardiolipin antibody positive - Positive anticardiolipin IgM 47, positive beta-2 IgM 31.  She is taking eliquis as prescribed.   ILD (interstitial lung disease) ([place]) - She is followed by Dr. Chase Caller.  High-resolution chest CT ordered on 03/16/2020 did not reveal any significant interval change in subtle irregular peripheral interstitial opacity predominantly seen in bilateral lung bases. Her most recent office visit with Dr. Chase Caller was on 07/29/2020.  Office visit note was reviewed today in the office.  She was advised to continue to take prednisone 5 mg 1 tablet daily, mycophenolate 360 mg twice daily, and bactrim as prescribed.   According to the patient she went to placed rehab which has improved her chronic cough. She has an upcoming appointment with Dr. Chase Caller on 12/31/2020.  High risk medication use - Myfortic 360 mg 1 tablet by mouth twice daily, Bactrim DS on Mondays, Wednesdays, and Fridays and prednisone 5 mg 1 tablet by mouth daily prescribed by Dr. Chase Caller.  Patient declined to have updated lab work today.  She would like to have her labs drawn at Dr. Golden Pop office at her upcoming appointment on 12/31/2020. Discussed the importance of holding myfortic if she develops signs or symptoms of an infection and to resume once the infection has cleared.  Primary osteoarthritis of both hands: She has PIP and DIP prominence consistent with osteoarthritis of both hands.  No joint tenderness or  inflammation was noted.  Trochanteric bursitis of both hips: She has occasional discomfort due to trochanter bursitis of both hips.  Age-related osteoporosis without current pathological fracture - DEXA scan from Nov 04, 2019 T score -2.9 left femoral neck, BMD 0.532.  DEXA results were reviewed with the patient today in the office and all questions were addressed.  She remains on long-term prednisone 5 mg daily.  Different treatment options for osteoporosis were discussed today in detail.  According to the patient her PCP prescribed Fosamax which she did not start due to the concern for possible side  effects. She has spoken with her family discussing the risks and benefits of starting a bisphosphonate but they will feel that she does not need to add on a medication at this time.  She declined all treatment options currently.  We discussed the importance of taking calcium and vitamin D as recommended.  We also discussed the importance of resistive exercises.  We also reviewed the importance of fall prevention. She will be due to update DEXA in May 2023 at which time we will compare to previous DEXA in 2021.  Long term systemic steroid user - She remains on prednisone 5 mg 1 tablet by mouth daily. She is aware of the risks of long term prednisone use.   Synovial cyst of left popliteal space  DDD (degenerative disc disease), lumbar - She has seen Dr. Ninfa Linden in the past.   History of atrial fibrillation - She is taking eliquis.   Other medical conditions are listed as follows:   Multiple lung nodules on CT - Most recent HRCT was on 03/16/20.  History of emphysema  History of hemochromatosis  History of hypothyroidism  Former smoker  Vitamin D deficiency  Orders: No orders of the defined types were placed in this encounter.  Meds ordered this encounter  Medications   methocarbamol (ROBAXIN) 500 MG tablet    Sig: Take 1 tablet (500 mg total) by mouth daily as needed for muscle spasms.     Dispense:  30 tablet    Refill:  2      Follow-Up Instructions: Return in about 5 months (around 05/10/2021) for Scleroderma, Osteoarthritis, ILD .   Ofilia Neas, PA-C  Note - This record has been created using Dragon software.  Chart creation errors have been sought, but may not always  have been located. Such creation errors do not reflect on  the standard of medical care.

## 2020-12-01 ENCOUNTER — Ambulatory Visit: Payer: Medicare Other | Admitting: Internal Medicine

## 2020-12-01 ENCOUNTER — Telehealth: Payer: Self-pay | Admitting: Internal Medicine

## 2020-12-01 NOTE — Telephone Encounter (Signed)
I called and spoke with patient regarding appt and pft. Patient was supposed to have a televisit with Dr. Chase Caller on 12/01/20 but would like to be seen in office. Patient was already scheduled for in office appt on 12/31/20 but is requesting to have PFTS done at this time as well. In looking at MR last OV note on 07/29/20, there was nothing said about needing pfts done at next [place] visit so I informed patient that if he wanted to do them, he can put in order at next [place] in July. Patient verbalized understanding and will call with any other questions/concerns. Nothing further needed.

## 2020-12-02 ENCOUNTER — Other Ambulatory Visit: Payer: Self-pay | Admitting: Internal Medicine

## 2020-12-08 ENCOUNTER — Encounter: Payer: Self-pay | Admitting: Physician Assistant

## 2020-12-08 ENCOUNTER — Ambulatory Visit (INDEPENDENT_AMBULATORY_CARE_PROVIDER_SITE_OTHER): Payer: Medicare Other | Admitting: Physician Assistant

## 2020-12-08 ENCOUNTER — Other Ambulatory Visit: Payer: Self-pay

## 2020-12-08 VITALS — BP 102/57 | HR 59 | Resp 14 | Ht 63.5 in | Wt 145.2 lb

## 2020-12-08 DIAGNOSIS — Z8709 Personal history of other diseases of the respiratory system: Secondary | ICD-10-CM

## 2020-12-08 DIAGNOSIS — J849 Interstitial pulmonary disease, unspecified: Secondary | ICD-10-CM

## 2020-12-08 DIAGNOSIS — M349 Systemic sclerosis, unspecified: Secondary | ICD-10-CM | POA: Diagnosis not present

## 2020-12-08 DIAGNOSIS — Z79899 Other long term (current) drug therapy: Secondary | ICD-10-CM

## 2020-12-08 DIAGNOSIS — M7062 Trochanteric bursitis, left hip: Secondary | ICD-10-CM

## 2020-12-08 DIAGNOSIS — M7061 Trochanteric bursitis, right hip: Secondary | ICD-10-CM

## 2020-12-08 DIAGNOSIS — Z87891 Personal history of nicotine dependence: Secondary | ICD-10-CM

## 2020-12-08 DIAGNOSIS — M5136 Other intervertebral disc degeneration, lumbar region: Secondary | ICD-10-CM

## 2020-12-08 DIAGNOSIS — R760 Raised antibody titer: Secondary | ICD-10-CM

## 2020-12-08 DIAGNOSIS — M7122 Synovial cyst of popliteal space [Baker], left knee: Secondary | ICD-10-CM

## 2020-12-08 DIAGNOSIS — Z7952 Long term (current) use of systemic steroids: Secondary | ICD-10-CM

## 2020-12-08 DIAGNOSIS — R918 Other nonspecific abnormal finding of lung field: Secondary | ICD-10-CM

## 2020-12-08 DIAGNOSIS — Z8679 Personal history of other diseases of the circulatory system: Secondary | ICD-10-CM

## 2020-12-08 DIAGNOSIS — E559 Vitamin D deficiency, unspecified: Secondary | ICD-10-CM

## 2020-12-08 DIAGNOSIS — M19041 Primary osteoarthritis, right hand: Secondary | ICD-10-CM

## 2020-12-08 DIAGNOSIS — Z8639 Personal history of other endocrine, nutritional and metabolic disease: Secondary | ICD-10-CM

## 2020-12-08 DIAGNOSIS — M810 Age-related osteoporosis without current pathological fracture: Secondary | ICD-10-CM

## 2020-12-08 DIAGNOSIS — M19042 Primary osteoarthritis, left hand: Secondary | ICD-10-CM

## 2020-12-08 MED ORDER — METHOCARBAMOL 500 MG PO TABS: 500.0000 mg | ORAL_TABLET | Freq: Every day | ORAL | 2 refills | Status: DC | PRN

## 2020-12-19 ENCOUNTER — Other Ambulatory Visit: Payer: Self-pay | Admitting: Internal Medicine

## 2020-12-29 ENCOUNTER — Ambulatory Visit: Payer: Medicare Other

## 2020-12-31 ENCOUNTER — Ambulatory Visit: Payer: Medicare Other | Admitting: Internal Medicine

## 2021-01-11 ENCOUNTER — Other Ambulatory Visit: Payer: Self-pay | Admitting: Internal Medicine

## 2021-01-11 NOTE — Telephone Encounter (Signed)
Prescription refill request for Eliquis received. Indication: afib  Last office visit: 08/31/2020, Caryl Comes  Scr: 0.78, 08/20/2020 Age: 80 yo  Weight: 65.9 kg   Pt is on the correct dose of Eliquis per dosing criteria, prescription refill sent for Eliquis 5mg  BID.

## 2021-02-23 ENCOUNTER — Encounter: Payer: Self-pay | Admitting: Internal Medicine

## 2021-02-23 ENCOUNTER — Ambulatory Visit (INDEPENDENT_AMBULATORY_CARE_PROVIDER_SITE_OTHER): Payer: Medicare Other | Admitting: Internal Medicine

## 2021-02-23 ENCOUNTER — Other Ambulatory Visit: Payer: Self-pay

## 2021-02-23 VITALS — BP 114/64 | HR 64 | Temp 98.0°F | Ht 63.5 in | Wt 143.8 lb

## 2021-02-23 DIAGNOSIS — Z5181 Encounter for therapeutic drug level monitoring: Secondary | ICD-10-CM

## 2021-02-23 DIAGNOSIS — Z7185 Encounter for immunization safety counseling: Secondary | ICD-10-CM

## 2021-02-23 DIAGNOSIS — Z79899 Other long term (current) drug therapy: Secondary | ICD-10-CM | POA: Diagnosis not present

## 2021-02-23 DIAGNOSIS — J432 Centrilobular emphysema: Secondary | ICD-10-CM

## 2021-02-23 DIAGNOSIS — R053 Chronic cough: Secondary | ICD-10-CM

## 2021-02-23 DIAGNOSIS — R768 Other specified abnormal immunological findings in serum: Secondary | ICD-10-CM | POA: Diagnosis not present

## 2021-02-23 DIAGNOSIS — J849 Interstitial pulmonary disease, unspecified: Secondary | ICD-10-CM

## 2021-02-23 LAB — CBC WITH DIFFERENTIAL/PLATELET
Basophils Absolute: 0 10*3/uL (ref 0.0–0.1)
Basophils Relative: 0.7 % (ref 0.0–3.0)
Eosinophils Absolute: 0 10*3/uL (ref 0.0–0.7)
Eosinophils Relative: 0.6 % (ref 0.0–5.0)
HCT: 35.2 % — ABNORMAL LOW (ref 36.0–46.0)
Hemoglobin: 11.3 g/dL — ABNORMAL LOW (ref 12.0–15.0)
Lymphocytes Relative: 15.3 % (ref 12.0–46.0)
Lymphs Abs: 0.9 10*3/uL (ref 0.7–4.0)
MCHC: 32 g/dL (ref 30.0–36.0)
MCV: 99.8 fl (ref 78.0–100.0)
Monocytes Absolute: 0.3 10*3/uL (ref 0.1–1.0)
Monocytes Relative: 5 % (ref 3.0–12.0)
Neutro Abs: 4.7 10*3/uL (ref 1.4–7.7)
Neutrophils Relative %: 78.4 % — ABNORMAL HIGH (ref 43.0–77.0)
Platelets: 234 10*3/uL (ref 150.0–400.0)
RBC: 3.52 Mil/uL — ABNORMAL LOW (ref 3.87–5.11)
RDW: 14.1 % (ref 11.5–15.5)
WBC: 6 10*3/uL (ref 4.0–10.5)

## 2021-02-23 LAB — BASIC METABOLIC PANEL
BUN: 15 mg/dL (ref 6–23)
CO2: 27 mEq/L (ref 19–32)
Calcium: 9 mg/dL (ref 8.4–10.5)
Chloride: 103 mEq/L (ref 96–112)
Creatinine, Ser: 0.84 mg/dL (ref 0.40–1.20)
GFR: 66 mL/min (ref >60.00)
Glucose, Bld: 112 mg/dL — ABNORMAL HIGH (ref 70–99)
Potassium: 3.8 mEq/L (ref 3.5–5.1)
Sodium: 137 mEq/L (ref 135–145)

## 2021-02-23 LAB — HEPATIC FUNCTION PANEL
ALT: 9 U/L (ref 0–35)
AST: 13 U/L (ref 0–37)
Albumin: 4 g/dL (ref 3.5–5.2)
Alkaline Phosphatase: 44 U/L (ref 39–117)
Bilirubin, Direct: 0.1 mg/dL (ref 0.0–0.3)
Total Bilirubin: 0.5 mg/dL (ref 0.2–1.2)
Total Protein: 6.3 g/dL (ref 6.0–8.3)

## 2021-02-23 NOTE — Progress Notes (Signed)
PCP [name],[name], PA-C   HPI  IOV 02/10/2016  Chief Complaint  Patient presents with   Pulmonary Consult    Pt referred by Dr. Melford Aase for chronic cough x 1 year. Pt states she feels she has a tickle that is causing her dry cough. Pt states she has DOE when climbing stairs. Pt deneis CP/tightness.     80 year old female with hemochromatosis homozygous gene followed by Dr. Beryle Beams (last phlebotomy many years ago and is on serial monitoring) with children and siblings with active disease. She has also atrial fibrillation followed by Dr. Caryl Comes. Reports insidious onset of chronic cough in the last 1 year. It is stable since onset. It fluctuates between mild and severe in severity. It is mostly dry in quality. Mostly present in the daytime but sometimes also wakes her up at night. It is definitely not progressive. It is episodic and present every day. Aggravated by talking sometimes and then the throat feels dry associated with tickle in the throat and also sensation the cough is coming from the upper chest retrosternally and sometimes relieved by drinking water or chewing on a lozenge. Also aggravated by seasonal changes particularly in the spring and the fall which makes her think she has allergies. There is sometimes associated gag. She also reports nonspecific occasional wheezing and associated shortness of breath that is nonspecific but present with exertion and relieved by rest No other clear cut aggravating or relieving factors.   cough associated history  - Medications: She is not on fish oil or ACE inhibitors - Sinus drainage: She does admit to spring allergies. She did have something removed from her hard palate several years ago by ENT does not know details - Acid reflux: She says she is significant acid reflux and is on Prilosec. Without which she'll have significant symptoms - Pulmonary disease: FenO 12 02/10/2016  and normal. She denies any personal history of asthma or  pulmonary fibrosis of COPD or emphysema smoked one pack per day started smoking at age 18 and quit in 1999. Making a 42 pack smoking history. Chest x-ray 08/26/2014 personally visualized is clear - Tobacco :  reports that she quit smoking about 18 years ago. She has a 42.00 pack-year smoking history. She has never used smokeless tobacco.       OV 02/18/2016  Chief Complaint  Patient presents with   Follow-up    Pt here after PFT and HRCT. Pt denies changes in SOB and cough. Pt denies any new complaints at this time.    Follow-up chronic cough. This visit is to follow-up on test results of function test and high resolution CT chest which are described below9 results show isolated reduction in diffusion capacity which can be explained by possible early ILD and also emphysema. There are also new findings of nodule 6 mm bilaterally on lower lobes. She prefers a very cautious approach to this evaluation.     Pulmonary function test 02/12/2016 - FVC 2.8 cm/101%, FEV1 1.9 L/91%. Ratio 60/90%. Total lung capacity 115%. DLCO reduced at 14.5/60%. She is isolated reduction in diffusion capacity    HRCT chest 02/16/16 IMPRESSION: 1. Mild patchy subpleural reticulation in both lungs with a basilar predominance. No significant traction bronchiectasis. No frank honeycombing. Findings could represent an interstitial lung disease such as nonspecific interstitial pneumonia (NSIP), with early usual interstitial pneumonia (UIP) not excluded. A follow-up high-resolution chest CT study in 12 months is recommended to assess temporal pattern stability. 2. Bilateral lower lobe  solid pulmonary nodules, largest 6 mm. Non-contrast chest CT at 3-6 months is recommended. If the nodules are stable at time of repeat CT, then future CT at 18-24 months (from today's scan) is considered optional for low-risk patients, but is recommended for high-risk patients. This recommendation follows the consensus statement:  Guidelines for Management of Incidental Pulmonary Nodules Detected on CT Images:From the Fleischner Society 2017; published online before print (10.1148/radiol.[id number]). 3. Additional findings include mild-to-moderate centrilobular emphysema, aortic atherosclerosis and 2 vessel coronary atherosclerosis.     Electronically Signed   By: Ilona Sorrel M.D.   On: 02/16/2016 14:14    OV 08/31/2016  Chief Complaint  Patient presents with   Follow-up    HRCT was never scheduled. Pt states that the cough is still present >> slightly improved since last OV. Pt states that she feels her allergies has gotten it ramped up again.    Follow-up multifactorial cough associated with autoimmune antibody positive, interstitial lung disease and emphysema previous history of smoking  After last visit in August 2017 she had high resolution CT chest in December 2017 that showed persistence of ILD. I personally visualized the CT chest. He comes for follow-up. She tells me that since August 2017 she has insidious onset of shortness of breath and is progressively worse. It is mild and present only on inclines is not therefore tenderness. In terms of her cough is persistent. It is associated with significant sinus drainage that she thinks is allergy related. She constantly clears her throat. Lab review shows autoimmune antibody positive in August 2017 with scleroderma antibody slightly elevated at 6.4. I referred her to rheumatology but she does not remember this and she did not make this follow-up. She also tells me that she definitely has a long history of [name] phenomena in her fingers but no one has ever formally diagnosed with connective tissu  e disease.  OV 11/28/2016  Chief Complaint  Patient presents with   Follow-up    Pt here after PFT. Pt states her breathing is uchanged since last OV. Pt c/o dry cough.- pt states this has slightly improved since last OV. Pt denies CP/tightness and f/c/s.      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 Since then she has seen rheumatologis Dr. Keturah Barre. I reviewd the notes and also discussed with the patient wa a good understading of what s going on. She tells me that some of the lupus antibodies have been positive correlating with Raynaud. Patient is on beta blocker for atrial fbrillation and it is the preference by the rheumatologist Dr. D that she switc to calcium channel blocker. She has a follow-up appointment pending with her electrophysiologist Dr. Caryl Comes.  She is scleroderma antibody positive but according to her rheumtolgist she does not have dermatologic  Manifestation of the disese. 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 She pulm  function test today and this is stable. In terms of arrest or symptoms she stable. The cough is actually improved. But she does get dyspneic walking up stairs especially a few flights. She does not wnt her to pulmonary habilitation because se exrcises on the treadmill although she does not monitor her saturations.   reports that she quit smoking about 19 years ago. She has a 42.00 pack-year smoking history. she has never used smokeless tobacco.   OV 06/12/2017  Chief Complaint  Patient presents with   Follow-up    Pt states that she has been doing good since last visit. States that she has a "tickle cough" that is sporadic and has SOB that she states is when she climbs 2 flights of stairs. Denies any CP.    Follow-up combined emphysema was interstitial lung disease [autoimmune undifferentiated connective tissue disease interstitial lung disease]. Autoimmune features from May 2018 rheumatology noted with Dr. Keturah Barre: Raynaud phenomenon positive without gangrene, positive anti-cardiolipin and positive IgM and SCL-70 positive without clinical features of  scleroderma    Last seen June 2018. Since then she's stable. Overall she tells me that only problem is a tickle in her throat and slight cough. This is up and down depending on the pollen exposure she gets. She gets dyspneic for climbing few to several flights of stairs. This is unchanged. She did have full function test today and that shows mild significant worsening though overall gradient is only mild.. However she's not feeling this. She has appointment with Dr. Keturah Barre  pending    OV 07/18/2017  Chief Complaint  Patient presents with   Follow-up    HRCT done 07/03/17.  Pt states she is the same as she was at last visit. SOB with exertion.    Follow-up combined emphysema was interstitial lung disease [autoimmune undifferentiated connective tissue disease interstitial lung disease]. Autoimmune features from May 2018 rheumatology noted with Dr. Keturah Barre: Raynaud phenomenon positive without gangrene, positive anti-cardiolipin and positive IgM and SCL-70 positive without clinical features of scleroderma    SWAY GUTTIERREZ presents for follow-up.  She is here to discuss the results of a high-resolution CT scan of the chest.  The scan was reviewed by Dr. Rosario Jacks thoracic radiology who feels that patient has probable UIP pattern that is definitely progressive compared to August 2017 although there is no comment about progression since June 2018 CT scan.  The pulmonary nodules itself are stable since August 2017 and are likely benign.  The change in the CT scan which is mild progression pulmonary fibrosis corresponds with the pulmonary function test that shows mild progression.  She is now here with her daughter Junie Panning and her husband.  She also states that 1 of her other daughters has rheumatoid arthritis and is on TNF alpha blockade.  All her 3 daughters have hemo-chromatosis    OV 08/29/2017  Chief Complaint  Patient presents with   Follow-up    Pt states she has been doing good since last visit. Pt  recently went to [place] and had some mild problems with SOB due to heat.  Pt was prescribed cellcept and bactrim but has not yet started meds due to questions with other meds she is taking.    80 year old female with ILD secondary to autoimmune disease clinically suspicious of scleroderma but also previous history of hemochromatosis with family history of hemochromatosis.  She is here with her husband.  This visit is only a discussion visit.  Because she has many questions about starting CellCept before she actually does.  We did hepatitis virus panel, QuantiFERON gold, G6PD and all this is normal.  Lab work is normal.  I checked with [facility] pharmacist about interactions and was cleared for her to start CellCept.  The only recommendation was for patient to come off Prilosec because of reduced effect of CellCept.  Patient questions revolved around CellCept: She does not want to come off PPI because of hiatal hernia and has had bad acid  reflux.  So I would not Willis Modena asking for any alternative PPI.  In addition she states she takes multiple multivitamins including Biotene, vitamin D3, vitamin D, vitamin T97 and folic acid.  She also takes Allegra occasionally.  She wants to make sure it is all okay with CellCept.  She has not had a flu shot today and she is asking if she should have it.  She has never had flu shot before.  She is also wondering about anticoagulation in the setting of CellCept and thyroid issues in the setting of Bactrim.  In terms of her genetics: Initially I corresponded with our local geneticist who thought she might be better served at [facility] but the [facility] genetics person wrote to me saying that patient should first be seen by [facility] rheumatology and hematology.  Patient's not so sure she wants to see the subspecialist.  She will speak to Dr. Beryle Beams her local hematologist to see if it is worthwhile seeing the hematology department at [facility].   Lungs/Pleura: Moderate centrilobular emphysema. Mild basilar predominant subpleural reticulation and ground-glass, increased from 02/16/2016. No traction bronchiectasis/ bronchiolectasis, architectural distortion or honeycombing. Side-by-side nodules in the medial left lower lobe measure up to 6 mm (series 3, image 95), unchanged from 02/16/2016 and considered benign. No air trapping. No pleural fluid. Airway is unremarkable.  IMPRESSION: 1. Mild progression in basilar predominant subpleural fibrosis from 02/16/2016, raising suspicion for usual interstitial pneumonitis. 2. Aortic atherosclerosis (ICD10-170.0). Moderate coronary artery calcification. 3.  Emphysema (ICD10-J43.9).     Electronically Signed   By: Lorin Picket M.D.   On: 07/03/2017 09:54    OV 09/19/2017  Chief Complaint  Patient presents with   Follow-up    Pt states she has been doing good. Currently on cellcept and bactrim and has been doing well on it once put back on omeprazole. Pt still has the cough and sometimes when trying to take a deep breath can't fully get all O2 out.   Emonee Winkowski returns for follow-up.  This is for autoimmune interstitial lung disease.  She was started on CellCept recently.  She is here to follow-up for therapeutic drug monitoring.  Because she was on omeprazole and we were initiating CellCept with Bactrim we asked her to hold off on omeprazole because the omeprazole impairs drug levels of CellCept.  However she tried it for a week and her acid reflux was significant despite ranitidine so she went back on omeprazole.  Pharmacist Willis Modena is advising changing the CellCept to equivaekbtdose of Myfortic. So far tolerating cellcept/pred/bactrimm well. Has seen dR G in heme and on observation. Had PFT today on cellcept/pred/bactrim and is better! Continue siwht spriva  OV 10/31/2017  . Chief Complaint  Patient presents with   Follow-up    Pt currently taking  myfortic and states she has been well on that. Pt states she has some mild nausea and has some problems with sleeping at night. SOB is stable,mild coughing. Denies any CP.    Follow-up progressive interstitial lung disease with SCL-70 antibody positive and Raynard Follow-up associated emphysema.  Last visit September 19, 2017.  At that time she had intolerance to the CellCept therefore we switched her to Myfortic.  She is here to report for follow-up about this.  She is tolerating Myfortic just well.  She had some transient insomnia for a few days last week but this resolved.  She only has mild intermittent occasional nausea with the Myfortic but otherwise is  tolerating it really well at the low-dose of 360 mg twice daily.  She continues on prednisone 10 mg daily and Bactrim 3 times a week.  She is due for blood work today.  There are no other new issues.  She continues on Spiriva for her emphysema   OV 02/01/2018  Chief Complaint  Patient presents with   Follow-up    PFT performed today.  Pt stated other than having shingles earlier, things have been doing good for her. Pt states SOB is stable and states she still has an occ cough.    Follow-up progressive interstitial lung disease with SCL-70 antibody positive and Raynud - IPAF Follow-up associated emphysema   KENZLIE DISCH presents for routine follow-up with interstitial lung disease clinic. She has the above problems. In the interim she tells me she had developed shingles on the right neck but this is resolved. This is despite having zoster vaccine in 2012. She was yet to have the bnnew  shingles vaccine.in any event currently she is feeling well. She only has occasional post residual neuropathic pain. In terms of her shortness of breath it is stable and minimal. Walking desaturation test shows stability. Pulmicort function test below show stability. She is tolerating the mycophenolate myfortic well at 360 mg twice daily and prednisone 10 mg  daily and Bactrim for PCP prophylaxis. Overall she feels stable and feels that it is best medicines on touch in terms of dosing change  OV 08/14/2018  Subjective:  Patient ID: [name], female , DOB: 02/16/1941 , age 80 y.o. , MRN: [id number] , ADDRESS: [place] [id number]   08/14/2018 -   Chief Complaint  Patient presents with   Follow-up    PFT performed today.  Pt states she is slowly getting better after her recent pna. States she still has some issues with SOB and an occ cough. Pt also states that she feels like she has a weight on her chest at times.    Follow-up progressive interstitial lung disease with SCL-70 antibody positive and Raynud - IPAF - on myfortic since march 2019 with pred 10 and bactrim  Follow-up associated emphysema - on spiriva    HPIfortic KAYLIN MARCON 80 y.o. -returns for 71-monthfollow-up of her interstitial lung disease associated with emphysema.  She presents with her middle daughter EJunie Panningwho is met me before.  She continues on Myfortic and prednisone 10 mg and Bactrim prophylaxis.  She continues on Spiriva.  She had an episode of atrial fibrillation according to review of the chart and my on history with in the middle of January 2020.  At this time she had a chest x-ray that showed asymptomatic upper lobe pneumonia.  I personally visualized the chest x-ray and confirmed the findings.  Antibiotic was not prescribed.  Then I follow this up with a CT scan of the chest [we canceled her elective colonoscopy].  CT chest showed improved pneumonia findings.  I prescribed even though she was feeling well cephalexin for a week.  She says she took it.  She continues to feel well except she has baseline amount of symptoms that is documented below.  Of note the high-resolution CT chest showed an improvement in ILD compared to a year ago.  In fact her pulmonary function tests are also showing stability/improvement as documented below.  She is very  happy about this outcome with immunosuppression .  However, she is a little bit unsure why she is symptomatic in  terms of shortness of breath and cough.  She is very concerned that her fibrosis is getting worse.  She wants to have closer follow-up than every 6 months.   IMPRESSION: HRCT Jan 2020 1. Vaguely bandlike patchy consolidation and ground-glass opacity at the periphery of the right upper and superior segment right lower lobes, new since 07/03/2017 chest CT, improving compared to 07/10/2018 chest radiograph, favoring resolving pneumonia with evolving postinfectious scarring. 2. Previously described basilar predominant subpleural reticulation and ground-glass attenuation is minimal and appears decreased in the interval. No bronchiectasis or honeycombing. Findings may represent an interstitial lung disease such as nonspecific interstitial pneumonia (NSIP) or less likely early usual interstitial pneumonia (UIP). Findings are indeterminate for UIP per consensus guidelines: Diagnosis of Idiopathic Pulmonary Fibrosis: An Official ATS/ERS/JRS/ALAT Clinical Practice Guideline. [place], Iss 5, [phone], Feb 25 2017. 3. Moderate centrilobular emphysema with mild diffuse bronchial wall thickening, suggesting COPD. 4. Two-vessel coronary atherosclerosis. 5. Small hiatal hernia.   Aortic Atherosclerosis (ICD10-I70.0) and Emphysema (ICD10-J43.9). ROS - per HPI    OV 04/22/2019  Subjective:  Patient ID: [name], female , DOB: 1941/03/02 , age 78 y.o. , MRN: [id number] , ADDRESS: [place] [id number]   Follow-up progressive interstitial lung disease with SCL-70 antibody positive and Raynud - IPAF - on myfortic since march 2019 with pred 5 and bactrim 3x/week  Follow-up associated emphysema - on spiriva   04/22/2019 -   Chief Complaint  Patient presents with   Follow-up    Patient reports that her breathing is doing well at this  time.      HPI LASHAY OSBORNE 80 y.o. -presents for follow-up.  Last seen by myself in early part of 2020 before the pandemic.  At that time CT chest suggested possible progression.  She saw a nurse practitioner in the interim after the onset of the pandemic.  She was anxious about the progression of her ILD so she asked for a CT scan in less than 1 year.  A CT scan of the chest was done in September 2020.  This is deemed stable since early part of the year but with possible progression versus stability since January 2019.  In terms of her symptoms she continues to be stable with very minimal shortness of breath and cough.  Her pulmonary function test below also shows stability.  Her walking desaturation test also shows stability.  She is tolerating her Myfortic, prednisone and 3 times a week of Bactrim just fine.  There are no side effects.  Last blood work was in August 2020 and this was reviewed.  We discussed the possibility of switching to antifibrotic nintedanib instead of taking immunosuppressants.  The rational for this would be it is a safer drug with less long-term side effect such as lack of immunosuppression.  However she feels her current regimen is working well for her.  She also feels that she would benefit more from a immunosuppressive drug because of her autoimmune antibodies and therefore she is more inclined to continue with her current immunosuppressive regimen of prednisone 5 mg/day and Myfortic and Bactrim 3 times a week.        OV 07/04/2019  Subjective:  Patient ID: [name], female , DOB: 01-Dec-1940 , age 26 y.o. , MRN: [id number] , ADDRESS: [place] [id number]   Follow-up mildy progressive - stable interstitial lung disease with SCL-70 antibody positive and Raynud - IPAF -  on myfortic since march 2019 with pred 5 and bactrim 3x/week  Follow-up associated emphysema  Follow-up Raynard  Has associated hemochromatosis  Has associated  atrial fibrillation on Xarelto   07/04/2019 -   Chief Complaint  Patient presents with   Follow-up    Pt states she has been doing well since last visit and denies any complaints.     HPI LORENZA SHAKIR 80 y.o. -last visit was in October 2020.  At that time she was doing well.  Given her continued immunosuppression with prednisone and Myfortic we discussed the possibility of switching the Myfortic to the nintedanib but understanding that there uncertainties whether this is superior approach and if she could end up with temporary side effects of the nintedanib.  Based on that she decided to continue with prednisone, Myfortic and Bactrim as she was doing because things are working well.  However she later called in and wanted this change to nintedanib.  We did this change to nintedanib but then her co-pay ended up being over 500+ dollars and then she started also getting worried about the side effects with nintedanib particularly GI.  She is also on Xarelto for atrial fibrillation.  Therefore she made this visit ahead of schedule to discuss the possibility of making the switch.  I understand from her that it will be 500+ dollars every month with nintedanib as a co-pay.  In addition she is worried about the superiority of taking nintedanib.  She fully understands that Myfortic over length of time can carry cancer risk and immunosuppression risk but at this point in time she feels well and she is tolerating it well.  She also understands that nintedanib can carry temporary GI side effects.  She also understands that it may not be a superior approach.  She understands it could be an equivalent approach and definitely lower immunosuppression.  Her main systemic immunosuppressive features are lung disease and Raynaud.  She is worried this might flareup.  In terms of her symptoms there is stable as documented below.  Her walking desaturation test is also stable.   Last pulmonary function test was in August  r 2020 Last CT scan of the chest was in September 2020 Last blood work October 2020 Last echocardiogram 2012.  ROS - per HPI  OV 10/22/2019  Subjective:  Patient ID: [name], female , DOB: 09/09/1940 , age 70 y.o. , MRN: [id number] , ADDRESS: [place] [id number]   Follow-up mildy progressive - stable interstitial lung disease with SCL-70 antibody positive and Raynud - IPAF - on myfortic since march 2019 with pred 5 and bactrim 3x/week  Follow-up associated emphysema  Follow-up Raynaud  Has associated hemochromatosis  Has associated atrial fibrillation on Xarelto    10/22/2019 -   Chief Complaint  Patient presents with   Follow-up    PFT performed today. Pt states she has been doing good since last visit. Denies any complaints with breathing.     HPI KAIRY FOLSOM 80 y.o. -returns for routine follow-up.  Overall doing well.  She feels shortness of breath is stable.  She feels she is tolerating Myfortic and prednisone fine no new interim issues she has had a Covid vaccine.  She continues on high risk immunosuppressive therapy.  There is some mild diarrhea with the Myfortic but it is tolerable.  The shortness of breath scale is stable walking desaturation test is stable.  Her pulmonary function test shows stable FVC with a possible  reduction in diffusion capacity.  I do not know what to make of this at this point in time.  It could be a technical issue because she is feeling stable.  She did have echocardiogram that showed grade 2 diastolic dysfunction.   She continues on her Spiriva for associated emphysema.  ROS - per HPI     OV 03/03/2020   Subjective:  Patient ID: [name], female , DOB: 09-Nov-1940, age 59 y.o. years. , MRN: [id number],  ADDRESS: [place] [address] PCP  Kathyrn Lass, MD Providers : Treatment Team:  Attending Provider: Brand Males, MD   Chief Complaint  Patient presents with    Follow-up    f/u pft     Follow-up mildy progressive - stable interstitial lung disease with SCL-70 antibody positive and Raynud - IPAF - on myfortic since march 2019 with pred 5 and bactrim 3x/week  Follow-up associated emphysema  Follow-up Raynaud  Has associated hemochromatosis  Has associated atrial fibrillation on Xarelto  Mild associated diastolic dysfunction and echo January 2021    HPI VIENO TARRANT 80 y.o. -returns for follow-up.  Overall she is doing well.  However in the last 1-2 months she says she is slightly more short of breath than usual.  She is also having slight increase in dry cough.  Based on her subjective symptom questionnaires it seems the cough itself is stable but she is definitely saying it is worse.  She is seeing the dyspnea is overall stable compared to the cough but looking at the symptom questionnaire is maybe it is a little worse.  Is hard to discern.  It has been approximately 1 year since she had a high-resolution CT chest.  She is wondering if she can have this repeated.  She is on immunosuppression.  She is also noticing -that at home her pulse oximetry on her finger varies a lot [she does have [name]]) and sometimes can be as low as 88% - 93%.  She does acknowledge that the circulation in her hands is not as good.  Pulse ox here at rest was normal.  She had pulmonary function test that shows stability.-With the FVC although I am not sure what to make of the DLCO.  She has had a Covid booster vaccine  Last echocardiogram January 2021 with mild diastolic dysfunction    IMPRESSION: 1. Very mild basilar predominant subpleural reticulation and ground-glass, similar to minimally progressive from 07/03/2017. Nonspecific interstitial pneumonitis is favored. Findings are indeterminate for UIP per consensus guidelines: Diagnosis of Idiopathic Pulmonary Fibrosis: An Official ATS/ERS/JRS/ALAT Clinical Practice Guideline. [place], Iss 5, [phone], Feb 25 2017. 2. Aortic atherosclerosis (ICD10-170.0). Coronary artery calcification. 3. Enlarged pulmonic trunk, indicative of pulmonary arterial hypertension. 4.  Emphysema (ICD10-J43.9).     Electronically Signed   By: Lorin Picket M.D.   On: 03/20/2019 14:47  Echocardiogram -January 2021: Shows grade 2 diastolic dysfunction but otherwise unremarkable.   ROS - per HPI  HPI LOREL LEMBO 80 y.o. -last visit was in September 2021.  She continues to do well overall.  Shortness of breath symptom question is stable.  Walking desaturation test is stable.  She is compliant with her medications.  In September 2021 there is concern that her ILD was getting worse because of change in PFT.  So we did a CT scan of the chest radiologist reported this to be stable since 2017.  Most recently she had blood  work in December 2021 chemistry panel is normal and I reviewed it.  CBC panel is also normal with stable mild anemia hemoglobin 12.4 g%.  She is up-to-date with her COVID vaccine including booster.  She had pulmonary function test today that showed some fluctuation.  Her main concern is that she is coughing more  In terms of her cough it is dry and has a barking quality to it.  Or laryngeal quality to it.  It is mostly at day and night but never wakes her up at night.  When she has it at night it is only when she wakes up.  Talking makes it worse.  Laughing makes it worse.  Drinking water makes it better.  She thinks it is progressive.  Symptom score showed the cough is getting worse and deep.  She has no fever or chills.  No desaturations.  We discussed the possibility of opportunistic infection but she rightfully questant if it could be opportunistic infection if everything else is stable.  There is no wheezing.  She is wondering if it could be because of allergies.  She has seasonal allergies.  Review of the records indicate this has not been investigated.  She is  willing to have an investigation for this.  We discussed the possibility of cough neuropathy.  She recollects has had gabapentin before when she had shingles.  She did not have any side effects from it.     CT Chest data sept 2021  TECHNIQUE: Multidetector CT imaging of the chest was performed following the standard protocol without intravenous contrast. High resolution imaging of the lungs, as well as inspiratory and expiratory imaging, was performed.   COMPARISON:  09/09/2019, 03/20/2019, 07/03/2017, 12/06/2016, 06/15/2016   FINDINGS: Cardiovascular: Aortic atherosclerosis. Normal heart size. Three-vessel coronary artery calcifications. No pericardial effusion.   Mediastinum/Nodes: No enlarged mediastinal, hilar, or axillary lymph nodes. Thyroid gland, trachea, and esophagus demonstrate no significant findings.   Lungs/Pleura: Moderate centrilobular emphysema. No significant interval change in subtle irregular peripheral interstitial opacity predominantly seen in the bilateral lung bases, although also noted in nondependent portions of the right middle lobe and lingula. Mild lobular air trapping on expiratory phase imaging. Stable, definitively benign and faintly rim calcified 6 mm nodule of the dependent left lower lobe (series 5, image 86). No pleural effusion or pneumothorax.   Upper Abdomen: No acute abnormality.   Musculoskeletal: No chest wall mass or suspicious bone lesions identified.   IMPRESSION: 1. No significant interval change in subtle irregular peripheral interstitial opacity predominantly seen in the bilateral lung bases, although also noted in nondependent portions of the right middle lobe and lingula. There is no appreciable change in these findings on multiple prior examinations dating back to 2017. Bland post infectious or inflammatory scarring is favored, although minimal fibrotic interstitial lung disease is not excluded and if characterized by  ATS pulmonary fibrosis criteria, findings are consistent with an early "indeterminate for UIP" pattern. Consider ongoing follow-up CT to assess for stability of fibrotic findings. Findings are indeterminate for UIP per consensus guidelines: Diagnosis of Idiopathic Pulmonary Fibrosis: An Official ATS/ERS/JRS/ALAT Clinical Practice Guideline. [place], Iss 5, [phone], Feb 25 2017. 2. Emphysema (ICD10-J43.9). 3. Coronary artery disease.  Aortic Atherosclerosis (ICD10-I70.0).     Electronically Signed   By: Eddie Candle M.D.   On: 03/16/2020 14:12  OV 02/23/2021  Subjective:  Patient ID: [name], female , DOB: 1941/02/25 , age 83 y.o. , MRN: [id number] ,  ADDRESS: [place] [address] PCP Kathyrn Lass, MD Patient Care Team: Kathyrn Lass, MD as PCP - General (Family Medicine) Deboraha Sprang, MD as PCP - Cardiology (Cardiology) Annia Belt, MD as Consulting Physician (Hematology) Deboraha Sprang, MD as Consulting Physician (Cardiology) Richmond Campbell, MD as Consulting Physician (Gastroenterology)  This Provider for this visit: Treatment Team:  Attending Provider: Brand Males, MD    02/23/2021 -   Chief Complaint  Patient presents with   Follow-up    Pt states she has been doing okay since last visit and denies any complaints.    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  Follow-up associated emphysema  Follow-up Raynaud  Has associated hemochromatosis  Has associated atrial fibrillation on Xarelto  Mild associated diastolic dysfunction and echo January 2021  HPI TALEA MANGES 80 y.o. -returns for her 16-monthfollow-up.  Last visit with me was in February 2022.  Is   She continues on Myfortic prednisone and Bactrim.  We held off on nintedanib  given stability.  She did have spirometry in February 2022.?  There is a slight decline but she feels stable.  Symptom scores are stable.  Walking desaturation test is below and is stable versus a three-point drop that is?  Significant..  Last CT scan September 2021.  But clinically she feels stable.  We held off on nintedanib last visit because of stability.     SYMPTOM SCALE - ILD 08/14/2018  04/22/2019  07/04/2019  10/22/2019  03/03/2020 139# 08/18/2020  02/23/2021   O2 use *RA        Shortness of Breath 0 -> 5 scale with 5 being worst (score 6 If unable to do)        At rest 1 0 0 0.5 1 0 0.5  Simple tasks - showers, clothes change, eating, shaving 1.5 0 .5 0._0 0.5  Household (dishes, doing bed, laundry) 1.5 1 0.5 0._1 0.5  Shopping 1.5 0 0/5 0._2 Walking keeping up with others of same age 83 0 0.5 0.5 1.5 1.5 1  Walking up Stairs 2.5 1 0.5 0/5 1.5 1.5 1.5  Total (40 - 48) Dyspnea Score 10- 2 2._3 How bad is your cough? 1.5 1.5 0.5 1 1.5 3 1.5  How bad is your fatigue 1._4 1.5 2 1.5  nausea    0 0 0.5 0  vomit    0 0 0 0  dairrhea    2.5 0 0.5 1.5  anxiety    0 0 0 0  deoresoj    0 0 0 0    Simple office walk 185 feet x  3 laps goal with forehead probe 07/04/2019  10/22/2019  03/03/2020 Finger probe 02/23/2021   O2 used ra ra ra  ra  Number laps completed _5 Comments about pace avg  avg   Resting Pulse Ox/HR 98% and 74/min 100% and 66 96% and 80 99% and 69  Final Pulse Ox/HR 98% and 87/min 97% and 81 95% and 83 96% and 78  Desaturated </= 88% no no no   Desaturated <= 3% points no no no yes  Got Tachycardic >/= 90/min no no no   Symptoms at end of test Mild  dyspnea Very mild dyspnea no no  Miscellaneous comments x        PFT  PFT Results Latest Ref Rng & Units 08/18/2020 02/28/2020 10/22/2019 02/18/2019 08/14/2018 02/01/2018 09/19/2017  FVC-Pre L 2.78 2.90 2.83 2.82 2.92 2.87 3.04  FVC-Predicted Pre % 105 108 106 104 108 105 111  FVC-Post L - - - - - -  -  FVC-Predicted Post % - - - - - - -  Pre FEV1/FVC % % 68 66 69 67 67 68 66  Post FEV1/FCV % % - - - - - - -  FEV1-Pre L 1.88 1.92 1.96 1.89 1.95 1.95 2.02  FEV1-Predicted Pre % 96 96 98 93 96 96 98  FEV1-Post L - - - - - - -  DLCO uncorrected ml/min/mmHg 11.74 11.88 12.46 15.31 13.19 13.07 13.83  DLCO UNC% % 62 63 66 81 70 54 57  DLCO corrected ml/min/mmHg 11.74 11.88 12.46 - - - 14.29  DLCO COR %Predicted % 62 63 66 - - - 59  DLVA Predicted % 65 66 73 79 68 60 61  TLC L - - - - - - -  TLC % Predicted % - - - - - - -  RV % Predicted % - - - - - - -    has a past medical history of Atrial fibrillation ([place]), Chest pain, Depression with anxiety, GERD (gastroesophageal reflux disease), Hemochromatosis, History of syncope, and Hypothyroidism.   reports that she quit smoking about 23 years ago. Her smoking use included cigarettes. She has a 42.00 pack-year smoking history. She has never used smokeless tobacco.  Past Surgical History:  Procedure Laterality Date   MOUTH SURGERY     SHOULDER ARTHROSCOPY Left    TOTAL ABDOMINAL HYSTERECTOMY     VARICOSE VEIN SURGERY      No Known Allergies  Immunization History  Administered Date(s) Administered   Fluad Quad(high Dose 65+) 02/18/2019   Influenza Split 03/27/2019   Influenza, High Dose Seasonal PF 08/29/2017, 04/26/2018, 04/29/2020   PFIZER(Purple Top)SARS-COV-2 Vaccination 07/22/2019, 08/12/2019, 03/11/2020   Pneumococcal Conjugate-13 06/12/2014   Pneumococcal Polysaccharide-23 06/27/2010, 04/25/2018   Tdap 03/16/2011, 07/05/2019   Zoster Recombinat (Shingrix) 04/08/2019   Zoster, Live 06/27/2010, 04/08/2019    Family History  Problem Relation Age of Onset   Stroke Mother    Heart disease Father 26   Hemochromatosis Brother      Current Outpatient Medications:    acetaminophen (TYLENOL) 325 MG tablet, Take 650 mg by mouth every 6 (six) hours as needed for mild pain or headache., Disp: , Rfl:    apixaban (ELIQUIS) 5 MG  TABS tablet, TAKE 1 TABLET(5 MG) BY MOUTH TWICE DAILY, Disp: 180 tablet, Rfl: 1   atorvastatin (LIPITOR) 10 MG tablet, Take 10 mg by mouth daily., Disp: , Rfl:    Cyanocobalamin (VITAMIN B 12 PO), Take 1,000 mcg by mouth daily., Disp: , Rfl:    flecainide (TAMBOCOR) 100 MG tablet, TAKE 1 TABLET BY MOUTH TWICE DAILY, Disp: 180 tablet, Rfl: 3   folic acid (FOLVITE) 1 MG tablet, Take 1 mg by mouth daily., Disp: , Rfl:    levothyroxine (SYNTHROID, LEVOTHROID) 112 MCG tablet, Take 112 mcg by mouth daily., Disp: , Rfl:    methocarbamol (ROBAXIN) 500 MG tablet, Take 1 tablet (500 mg total) by mouth daily as needed for muscle spasms., Disp: 30 tablet, Rfl: 2   metoprolol succinate (TOPROL-XL) 25 MG 24 hr tablet, TAKE 1 TABLET BY MOUTH  DAILY, Disp: 90  tablet, Rfl: 2   mycophenolate (MYFORTIC) 360 MG TBEC EC tablet, TAKE 1 TABLET BY MOUTH TWICE DAILY, Disp: 180 tablet, Rfl: 3   omeprazole (PRILOSEC) 20 MG capsule, Take 20 mg by mouth 2 (two) times daily., Disp: , Rfl:    ondansetron (ZOFRAN ODT) 4 MG disintegrating tablet, Take 1-2 tablets three times daily as needed, Disp: 42 tablet, Rfl: 1   predniSONE (DELTASONE) 5 MG tablet, TAKE 1 TABLET(5 MG) BY MOUTH DAILY WITH BREAKFAST, Disp: 90 tablet, Rfl: 2   SPIRIVA RESPIMAT 2.5 MCG/ACT AERS, INHALE 2 PUFFS INTO THE LUNGS DAILY, Disp: 4 g, Rfl: 10   sulfamethoxazole-trimethoprim (BACTRIM DS) 800-160 MG tablet, TAKE 1 TABLET BY MOUTH ON MONDAY, WEDNESDAY AND FRIDAY, Disp: 90 tablet, Rfl: 3   VITAMIN D PO, Take 5,000 Units by mouth 4 (four) times a week., Disp: , Rfl:       Objective:   Vitals:   02/23/21 1331  BP: 114/64  Pulse: 64  Temp: 98 F (36.7 C)  TempSrc: Oral  SpO2: 99%  Weight: 143 lb 12.8 oz (65.2 kg)  Height: 5' 3.5" (1.613 m)    Estimated body mass index is 25.07 kg/m as calculated from the following:   Height as of this encounter: 5' 3.5" (1.613 m).   Weight as of this encounter: 143 lb 12.8 oz (65.2 kg).  _0 @  Filed  Weights   02/23/21 1331  Weight: 143 lb 12.8 oz (65.2 kg)     Physical Exam    General: No distress. Look swell Neuro: Alert and Oriented x 3. GCS 15. Speech normal Psych: Pleasant Resp:  Barrel Chest - no.  Wheeze - no, Crackles - no, No overt respiratory distress CVS: Normal heart sounds. Murmurs - no Ext: Stigmata of Connective Tissue Disease - no HEENT: Normal upper airway. PEERL +. No post nasal drip        Assessment:       ICD-10-CM   1. ILD (interstitial lung disease) ([place])  J84.9 SAR CoV2 Serology (COVID 19)AB(IGG)IA    Hepatic function panel    Basic metabolic panel    CBC with Differential/Platelet    Pulmonary function test    2. 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 High risk medication use  Z79.899 Hepatic function panel    Basic metabolic panel    CBC with Differential/Platelet    4. Therapeutic drug monitoring  Z51.81 Hepatic function panel    Basic metabolic panel    CBC with Differential/Platelet    5. Centrilobular emphysema ([place])  J43.2     6. Vaccine counseling  Z71.85 SAR CoV2 Serology (COVID 19)AB(IGG)IA    7. Chronic cough  R05.3          Plan:     Patient Instructions     ICD-10-CM   1. ILD (interstitial lung disease) ([place])  J84.9   2. High risk medication use  Z79.899   3. Therapeutic drug monitoring  Z51.81   4. 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 [name] phenomenon without gangrene  I73.00   6. Centrilobular emphysema (HCC)  J43.2       Interstitial pulmonary disease (HCC) Scl-70 antibody positive [name] phenomenon without gangrene High risk medication use Therapeutic drug monitoring  Pulmonary fibrosis itself appears stable compared to last visit based on symptom score, walk test  Last CT scan of the chest in September 2021   Plan  - cotninue  prednisone to 5 mg per day +  myfortic and bactrim at current dose -  Hold off on ofev given fact things are working well currently  - do cbc, bmet, LFT  02/23/2021    Pulmonary emphysema, unspecified emphysema type ([place]) -  Continue spiriva  Chronic cough Irritable Larynx History of seasonal allergies  -Based on history I suspect chronic cough is due to cough neuropathy otherwise call irritable larynx.  But he has seasonal allergies playing a role in this is open question. Other reasons  interstitial lung disease [as above and stable] the underlying emphysema [stable]  - Glad currently very mild and improved  Plan -expectant followup   Vaccine counseling  -There is emerging evidence that immunosuppressed patients on CellCept do not respond well to Covid vaccine even despite the booster  Plan -Check Covid IgG (last visit was not collected) -> if negative will refer you to monoclonal antibody EVUSHELD prophylaxis  (OR) you can just wait for new strain for covid vaccine  Followup -6 months do spirometry and dlco  -69mn visit in 6 months with Dr RGaye Alken   Dr. MBrand Males M.D., F.C.C.P,  Pulmonary and Critical Care Medicine Staff Physician, CEllsworthDirector - Interstitial Lung Disease  Program  [facility] [place] [address] Pager: [phone] If no answer or between  15:00h - 7:00h: call [phone] Telephone: [phone]  2:02 PM 02/23/2021

## 2021-02-23 NOTE — Addendum Note (Signed)
Addended by: Suzzanne Cloud E on: 02/23/2021 02:08 PM   Modules accepted: Orders

## 2021-02-23 NOTE — Patient Instructions (Addendum)
ICD-10-CM   1. ILD (interstitial lung disease) ([place])  J84.9   2. High risk medication use  Z79.899   3. Therapeutic drug monitoring  Z51.81   4. 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 [name] phenomenon without gangrene  I73.00   6. Centrilobular emphysema (HCC)  J43.2       Interstitial pulmonary disease (HCC) Scl-70 antibody positive [name] phenomenon without gangrene High risk medication use Therapeutic drug monitoring  Pulmonary fibrosis itself appears stable compared to last visit based on symptom score, walk test  Last CT scan of the chest in September 2021   Plan  - cotninue  prednisone to 5 mg per day +  myfortic and bactrim at current dose -  Hold off on ofev given fact things are working well currently  - do cbc, bmet, LFT 02/23/2021    Pulmonary emphysema, unspecified emphysema type ([place]) -  Continue spiriva  Chronic cough Irritable Larynx History of seasonal allergies  -Based on history I suspect chronic cough is due to cough neuropathy otherwise call irritable larynx.  But he has seasonal allergies playing a role in this is open question. Other reasons  interstitial lung disease [as above and stable] the underlying emphysema [stable]  - Glad currently very mild and improved  Plan -expectant followup   Vaccine counseling  -There is emerging evidence that immunosuppressed patients on CellCept do not respond well to Covid vaccine even despite the booster  Plan -Check Covid IgG (last visit was not collected) -> if negative will refer you to monoclonal antibody EVUSHELD prophylaxis  (OR) you can just wait for new strain for covid vaccine  Followup -6 months do spirometry and dlco  -65mn visit in 6 months with Dr RChase Caller

## 2021-02-24 LAB — SARS-COV-2 ANTIBODY(IGG)SPIKE,SEMI-QUANTITATIVE: SARS COV1 AB(IGG)SPIKE,SEMI QN: 24.87 index — ABNORMAL HIGH (ref <1.00)

## 2021-04-19 ENCOUNTER — Other Ambulatory Visit: Payer: Self-pay

## 2021-04-19 ENCOUNTER — Ambulatory Visit
Admission: RE | Admit: 2021-04-19 | Discharge: 2021-04-19 | Disposition: A | Discharge status: Home / Self Care | Payer: Medicare Other | Source: Ambulatory Visit | Attending: Family Medicine | Admitting: Family Medicine

## 2021-04-19 DIAGNOSIS — Z1231 Encounter for screening mammogram for malignant neoplasm of breast: Secondary | ICD-10-CM

## 2021-05-10 ENCOUNTER — Ambulatory Visit: Payer: Medicare Other | Admitting: Rheumatology

## 2021-05-28 NOTE — Progress Notes (Deleted)
Office Visit Note  Patient: [name]             Date of Birth: 1941-04-30           MRN: [id number]             PCP: Kathyrn Lass, MD Referring: Kathyrn Lass, MD Visit Date: 06/11/2021 Occupation: @GUAROCC @  Subjective:  No chief complaint on file.   History of Present Illness: [name] is a 80 y.o. female ***   Activities of Daily Living:  Patient reports morning stiffness for *** {minute/hour:19697}.   Patient {ACTIONS;DENIES/REPORTS:21021675::"Denies"} nocturnal pain.  Difficulty dressing/grooming: {ACTIONS;DENIES/REPORTS:21021675::"Denies"} Difficulty climbing stairs: {ACTIONS;DENIES/REPORTS:21021675::"Denies"} Difficulty getting out of chair: {ACTIONS;DENIES/REPORTS:21021675::"Denies"} Difficulty using hands for taps, buttons, cutlery, and/or writing: {ACTIONS;DENIES/REPORTS:21021675::"Denies"}  No Rheumatology ROS completed.   PMFS History:  Patient Active Problem List   Diagnosis Date Noted  . High risk medication use 02/18/2019  . Therapeutic drug monitoring 02/18/2019  . Healthcare maintenance 02/18/2019  . ILD (interstitial lung disease) ([place]) 11/28/2016  . Multiple lung nodules on CT 11/28/2016  . Abnormal laboratory test 11/18/2016  . DDD L spine 11/18/2016  . 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 History of hypothyroidism 11/10/2016  . History of hemochromatosis 11/10/2016  . Pulmonary emphysema ([place]) 08/31/2016  . [name] phenomenon without gangrene 08/31/2016  . Sinusitis, chronic 08/31/2016  . Chronic cough 02/10/2016  . Irritable larynx 02/10/2016  . Stopped smoking with greater than 40 pack year history 02/10/2016  . Chest pain, unspecified 10/06/2010  . Shortness of breath 10/06/2010  . Hypothyroidism   . Hereditary hemochromatosis ([place]) 04/22/2010  . ATRIAL FIBRILLATION 10/20/2008  . [facility] 10/20/2008  . SYNCOPE AND COLLAPSE 10/20/2008    Past Medical History:  Diagnosis Date  . Atrial fibrillation ([place])    a. Flecainide  therapy;  b. event monitor 4/12  . Chest pain    a. GXT myoview 4/12: no isch., EF 86%;   b. echo 4/12: EF 55-65%, grade 1 diast dysfxn, LAE  . Depression with anxiety   . GERD (gastroesophageal reflux disease)   . 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 Beryle Beams  . History of syncope   . Hypothyroidism     Family History  Problem Relation Age of Onset  . Stroke Mother   . Heart disease Father 34  . Hemochromatosis Brother   . Breast cancer Neg Hx    Past Surgical History:  Procedure Laterality Date  . MOUTH SURGERY    . SHOULDER ARTHROSCOPY Left   . TOTAL ABDOMINAL HYSTERECTOMY    . VARICOSE VEIN SURGERY     Social History   Social History Narrative   REGULAR EXERCISE   Immunization History  Administered Date(s) Administered  . Fluad Quad(high Dose 65+) 02/18/2019  . Influenza Split 03/27/2019  . Influenza, High Dose Seasonal PF 08/29/2017, 04/26/2018, 04/29/2020  . PFIZER(Purple Top)SARS-COV-2 Vaccination 07/22/2019, 08/12/2019, 03/11/2020  . Pneumococcal Conjugate-13 06/12/2014  . Pneumococcal Polysaccharide-23 06/27/2010, 04/25/2018  . Tdap 03/16/2011, 07/05/2019  . Zoster Recombinat (Shingrix) 04/08/2019  . Zoster, Live 06/27/2010, 04/08/2019     Objective: Vital Signs: There were no vitals taken for this visit.   Physical Exam   Musculoskeletal Exam: ***  CDAI Exam: CDAI Score: -- Patient Global: --; Provider Global: -- Swollen: --; Tender: -- Joint Exam 06/11/2021   No joint exam has been documented for this visit   There is currently no information documented on the homunculus. Go to the Rheumatology activity and complete the  homunculus joint exam.  Investigation: No additional findings.  Imaging: No results found.  Recent Labs: Lab Results  Component Value Date   WBC 6.0 02/23/2021   HGB 11.3 (L) 02/23/2021   PLT 234.0 02/23/2021   NA 137 02/23/2021   K 3.8 02/23/2021   CL 103 02/23/2021   CO2 27 02/23/2021    GLUCOSE 112 (H) 02/23/2021   BUN 15 02/23/2021   CREATININE 0.84 02/23/2021   BILITOT 0.5 02/23/2021   ALKPHOS 44 02/23/2021   AST 13 02/23/2021   ALT 9 02/23/2021   PROT 6.3 02/23/2021   ALBUMIN 4.0 02/23/2021   CALCIUM 9.0 02/23/2021   GFRAA 101 06/09/2020   QFTBGOLDPLUS NEGATIVE 08/10/2017    Speciality Comments: No specialty comments available.  Procedures:  No procedures performed Allergies: Patient has no known allergies.   Assessment / Plan:     Visit Diagnoses: No diagnosis found.  Orders: No orders of the defined types were placed in this encounter.  No orders of the defined types were placed in this encounter.   Face-to-face time spent with patient was *** minutes. Greater than 50% of time was spent in counseling and coordination of care.  Follow-Up Instructions: No follow-ups on file.   Earnestine Mealing, CMA  Note - This record has been created using Editor, commissioning.  Chart creation errors have been sought, but may not always  have been located. Such creation errors do not reflect on  the standard of medical care.

## 2021-06-03 ENCOUNTER — Ambulatory Visit: Payer: Medicare Other | Admitting: Rheumatology

## 2021-06-11 ENCOUNTER — Ambulatory Visit: Payer: Medicare Other | Admitting: Rheumatology

## 2021-06-11 DIAGNOSIS — R760 Raised antibody titer: Secondary | ICD-10-CM

## 2021-06-11 DIAGNOSIS — M349 Systemic sclerosis, unspecified: Secondary | ICD-10-CM

## 2021-06-11 DIAGNOSIS — J849 Interstitial pulmonary disease, unspecified: Secondary | ICD-10-CM

## 2021-06-11 DIAGNOSIS — E559 Vitamin D deficiency, unspecified: Secondary | ICD-10-CM

## 2021-06-11 DIAGNOSIS — M810 Age-related osteoporosis without current pathological fracture: Secondary | ICD-10-CM

## 2021-06-11 DIAGNOSIS — Z79899 Other long term (current) drug therapy: Secondary | ICD-10-CM

## 2021-06-11 DIAGNOSIS — Z87891 Personal history of nicotine dependence: Secondary | ICD-10-CM

## 2021-06-11 DIAGNOSIS — Z7952 Long term (current) use of systemic steroids: Secondary | ICD-10-CM

## 2021-06-11 DIAGNOSIS — Z8679 Personal history of other diseases of the circulatory system: Secondary | ICD-10-CM

## 2021-06-11 DIAGNOSIS — Z8709 Personal history of other diseases of the respiratory system: Secondary | ICD-10-CM

## 2021-06-11 DIAGNOSIS — M7061 Trochanteric bursitis, right hip: Secondary | ICD-10-CM

## 2021-06-11 DIAGNOSIS — M5136 Other intervertebral disc degeneration, lumbar region: Secondary | ICD-10-CM

## 2021-06-11 DIAGNOSIS — M19041 Primary osteoarthritis, right hand: Secondary | ICD-10-CM

## 2021-06-11 DIAGNOSIS — R918 Other nonspecific abnormal finding of lung field: Secondary | ICD-10-CM

## 2021-06-11 DIAGNOSIS — Z8639 Personal history of other endocrine, nutritional and metabolic disease: Secondary | ICD-10-CM

## 2021-06-11 DIAGNOSIS — M7122 Synovial cyst of popliteal space [Baker], left knee: Secondary | ICD-10-CM

## 2021-06-23 ENCOUNTER — Telehealth: Payer: Self-pay | Admitting: Internal Medicine

## 2021-06-23 MED ORDER — NIRMATRELVIR/RITONAVIR (PAXLOVID)TABLET: 3.0000 | ORAL_TABLET | Freq: Two times a day (BID) | ORAL | 0 refills | Status: AC

## 2021-06-23 NOTE — Telephone Encounter (Signed)
Spoke with the pt  She states that she woke up in the night last night with bad HA and sinus pressure  She took some tylenol and woke up still with HA and sinus pressure  She took covid test at home and it was pos  She says her grand daughter was over on Christmas and she also has tested pos  She is not having any fevers, body aches, wheezing, increased SOB  Has had covid vax x 4 and also flu vaccine  She wants to know if something can be called in  Please advise thanks!  No Known Allergies Had labs that are in Epic 02/23/21

## 2021-06-23 NOTE — Telephone Encounter (Signed)
°  COVID POSITIVe   Her current meds are   Current Outpatient Medications:    acetaminophen (TYLENOL) 325 MG tablet, Take 650 mg by mouth every 6 (six) hours as needed for mild pain or headache., Disp: , Rfl:    apixaban (ELIQUIS) 5 MG TABS tablet, TAKE 1 TABLET(5 MG) BY MOUTH TWICE DAILY, Disp: 180 tablet, Rfl: 1   atorvastatin (LIPITOR) 10 MG tablet, Take 10 mg by mouth daily., Disp: , Rfl:    Cyanocobalamin (VITAMIN B 12 PO), Take 1,000 mcg by mouth daily., Disp: , Rfl:    flecainide (TAMBOCOR) 100 MG tablet, TAKE 1 TABLET BY MOUTH TWICE DAILY, Disp: 180 tablet, Rfl: 3   folic acid (FOLVITE) 1 MG tablet, Take 1 mg by mouth daily., Disp: , Rfl:    levothyroxine (SYNTHROID, LEVOTHROID) 112 MCG tablet, Take 112 mcg by mouth daily., Disp: , Rfl:    methocarbamol (ROBAXIN) 500 MG tablet, Take 1 tablet (500 mg total) by mouth daily as needed for muscle spasms., Disp: 30 tablet, Rfl: 2   metoprolol succinate (TOPROL-XL) 25 MG 24 hr tablet, TAKE 1 TABLET BY MOUTH  DAILY, Disp: 90 tablet, Rfl: 2   mycophenolate (MYFORTIC) 360 MG TBEC EC tablet, TAKE 1 TABLET BY MOUTH TWICE DAILY, Disp: 180 tablet, Rfl: 3   omeprazole (PRILOSEC) 20 MG capsule, Take 20 mg by mouth 2 (two) times daily., Disp: , Rfl:    ondansetron (ZOFRAN ODT) 4 MG disintegrating tablet, Take 1-2 tablets three times daily as needed, Disp: 42 tablet, Rfl: 1   predniSONE (DELTASONE) 5 MG tablet, TAKE 1 TABLET(5 MG) BY MOUTH DAILY WITH BREAKFAST, Disp: 90 tablet, Rfl: 2   SPIRIVA RESPIMAT 2.5 MCG/ACT AERS, INHALE 2 PUFFS INTO THE LUNGS DAILY, Disp: 4 g, Rfl: 10   sulfamethoxazole-trimethoprim (BACTRIM DS) 800-160 MG tablet, TAKE 1 TABLET BY MOUTH ON MONDAY, WEDNESDAY AND FRIDAY, Disp: 90 tablet, Rfl: 3   VITAMIN D PO, Take 5,000 Units by mouth 4 (four) times a week., Disp: , Rfl:   xxxxxxxxxxxxxxxxxx  Please call in    PAXLOVID   Paxlovid (nirmatelvir 300/Ritonavir100) - BID x 5 days - for GFR >= 60 (creat normal aug 2022) BUT  BEFORE DOING THAT ->   PLEASE CHECK MED LIST for the following issues. Please check the 2 different condition related to concomitant medications in alphabetical order  If [name] with DOB [birth year] is on any of the following Strong CYP3A inhibtors - this patient [name] should withold these concomitant meds so they can start paxlovid stratight away. If taking any of these:  alfuzosin, amiodarone, clozapine, colchicine, dihydroergotamine, dronedarone, ergotamine, flecainide, lovastatin, lurasidone, methylergonovine, midazolam [oral], pethidine, pimozide, propafenone, propoxyphene, quinidine, ranolazine, sildenafil simvastatin, triazolam).   If   [name]  with dob Jan 06, 1941 Is on any of these other strong CYP3A inducers then starting paxlovid should be delayed and the following meds should wash out first. These are dapalutamide, carbamazepine, phenobarbital, phenytoin, rifampin, [place] wort) - let me know immediately and we should delay starting paxlovid by some days even if he stops these medication.    PLEASE INFORM [name]  OF FOLLOWING SIDE EFFECTS  Side effects - all < 5%  - skin rash (and veyr rare a conditon called TEN) - angiomedia  - myalgia - jaundice - high bP (1%) - loss of taste  - diarrhea 25% rebound

## 2021-06-23 NOTE — Telephone Encounter (Signed)
Called and spoke with patient, advised of recommendations per Dr. Chase Caller. She verbalized understanding.  Verified pharmacy and script sent for Paxlovid.  Nothing further needed.

## 2021-06-27 ENCOUNTER — Other Ambulatory Visit: Payer: Self-pay | Admitting: Internal Medicine

## 2021-06-29 ENCOUNTER — Other Ambulatory Visit: Payer: Self-pay

## 2021-06-29 MED ORDER — APIXABAN 5 MG PO TABS: ORAL_TABLET | ORAL | 1 refills | Status: DC

## 2021-06-29 NOTE — Telephone Encounter (Signed)
Pt last saw Dr Caryl Comes 08/31/20, last labs 02/23/21 Creat 0.84, age 81, weight 65.2kg, based on specified criteria pt is on appropriate dosage of Eliquis 5mg  BID for afib.  Will refill rx.

## 2021-07-05 ENCOUNTER — Other Ambulatory Visit: Payer: Self-pay

## 2021-07-05 ENCOUNTER — Encounter (INDEPENDENT_AMBULATORY_CARE_PROVIDER_SITE_OTHER): Payer: Medicare Other | Admitting: Ophthalmology

## 2021-07-05 DIAGNOSIS — D3132 Benign neoplasm of left choroid: Secondary | ICD-10-CM

## 2021-07-05 DIAGNOSIS — H353131 Nonexudative age-related macular degeneration, bilateral, early dry stage: Secondary | ICD-10-CM | POA: Diagnosis not present

## 2021-07-05 DIAGNOSIS — H35371 Puckering of macula, right eye: Secondary | ICD-10-CM | POA: Diagnosis not present

## 2021-07-05 DIAGNOSIS — H43813 Vitreous degeneration, bilateral: Secondary | ICD-10-CM

## 2021-07-23 ENCOUNTER — Ambulatory Visit: Payer: Medicare Other | Admitting: Internal Medicine

## 2021-07-23 ENCOUNTER — Encounter: Payer: Self-pay | Admitting: Internal Medicine

## 2021-07-23 ENCOUNTER — Other Ambulatory Visit: Payer: Self-pay

## 2021-07-23 VITALS — BP 108/64 | HR 68 | Temp 97.8°F | Ht 63.5 in | Wt 143.0 lb

## 2021-07-23 DIAGNOSIS — Z5181 Encounter for therapeutic drug level monitoring: Secondary | ICD-10-CM | POA: Diagnosis not present

## 2021-07-23 DIAGNOSIS — R053 Chronic cough: Secondary | ICD-10-CM

## 2021-07-23 DIAGNOSIS — Z79899 Other long term (current) drug therapy: Secondary | ICD-10-CM

## 2021-07-23 DIAGNOSIS — R768 Other specified abnormal immunological findings in serum: Secondary | ICD-10-CM

## 2021-07-23 DIAGNOSIS — J432 Centrilobular emphysema: Secondary | ICD-10-CM

## 2021-07-23 DIAGNOSIS — J849 Interstitial pulmonary disease, unspecified: Secondary | ICD-10-CM | POA: Diagnosis not present

## 2021-07-23 LAB — HEPATIC FUNCTION PANEL
ALT: 9 U/L (ref 0–35)
AST: 13 U/L (ref 0–37)
Albumin: 4.1 g/dL (ref 3.5–5.2)
Alkaline Phosphatase: 43 U/L (ref 39–117)
Bilirubin, Direct: 0.1 mg/dL (ref 0.0–0.3)
Total Bilirubin: 0.4 mg/dL (ref 0.2–1.2)
Total Protein: 6.4 g/dL (ref 6.0–8.3)

## 2021-07-23 LAB — BASIC METABOLIC PANEL
BUN: 17 mg/dL (ref 6–23)
CO2: 31 mEq/L (ref 19–32)
Calcium: 9.3 mg/dL (ref 8.4–10.5)
Chloride: 102 mEq/L (ref 96–112)
Creatinine, Ser: 0.81 mg/dL (ref 0.40–1.20)
GFR: 68.74 mL/min (ref >60.00)
Glucose, Bld: 84 mg/dL (ref 70–99)
Potassium: 4.1 mEq/L (ref 3.5–5.1)
Sodium: 138 mEq/L (ref 135–145)

## 2021-07-23 LAB — CBC WITH DIFFERENTIAL/PLATELET
Basophils Absolute: 0 10*3/uL (ref 0.0–0.1)
Basophils Relative: 0.7 % (ref 0.0–3.0)
Eosinophils Absolute: 0 10*3/uL (ref 0.0–0.7)
Eosinophils Relative: 0.5 % (ref 0.0–5.0)
HCT: 33.1 % — ABNORMAL LOW (ref 36.0–46.0)
Hemoglobin: 10.8 g/dL — ABNORMAL LOW (ref 12.0–15.0)
Lymphocytes Relative: 16.2 % (ref 12.0–46.0)
Lymphs Abs: 0.9 10*3/uL (ref 0.7–4.0)
MCHC: 32.5 g/dL (ref 30.0–36.0)
MCV: 99.1 fl (ref 78.0–100.0)
Monocytes Absolute: 0.5 10*3/uL (ref 0.1–1.0)
Monocytes Relative: 8.7 % (ref 3.0–12.0)
Neutro Abs: 4.3 10*3/uL (ref 1.4–7.7)
Neutrophils Relative %: 73.9 % (ref 43.0–77.0)
Platelets: 215 10*3/uL (ref 150.0–400.0)
RBC: 3.34 Mil/uL — ABNORMAL LOW (ref 3.87–5.11)
RDW: 14.6 % (ref 11.5–15.5)
WBC: 5.8 10*3/uL (ref 4.0–10.5)

## 2021-07-23 NOTE — Addendum Note (Signed)
Addended by: Lorretta Harp on: 07/23/2021 02:44 PM   Modules accepted: Orders

## 2021-07-23 NOTE — Patient Instructions (Addendum)
ICD-10-CM   1. ILD (interstitial lung disease) ([place])  J84.9   2. High risk medication use  Z79.899   3. Therapeutic drug monitoring  Z51.81   4. 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 [name] phenomenon without gangrene  I73.00   6. Centrilobular emphysema ([facility])  J43.2       Interstitial pulmonary disease ([facility]) Scl-70 antibody positive [name] phenomenon without gangrene High risk medication use Therapeutic drug monitoring  Symptoms slightly worse than baseline. Unclear if covid in dec 2022 is making this worse or fibrosis is slight worse.  - Last CT scan of the chest in September 2021 - Last echo Jan 2021 - Last PFT feb 2022  Plan  - cotninue  prednisone to 5 mg per day +  myfortic and bactrim at current dose - do cbc, bmet, LFT 07/23/2021 -do HRCT supine and prone next 1-3 months - do ECHO next 1-3 months  -do spiroemtry and dlco next 1-3 months  - do simple walking desaturation test 07/23/2021 or nex tivist -     Pulmonary emphysema, unspecified emphysema type ([place]) -  Continue spiriva  Chronic cough Irritable Larynx History of seasonal allergies  -Based on history I suspect chronic cough is due to cough neuropathy otherwise call irritable larynx.  But he has seasonal allergies playing a role in this is open question. Other reasons  interstitial lung disease [as above and stable] the underlying emphysema [stable]  - Glad currently very mild and improved  Plan -expectant followup   Followup -1-3 months with Dr Chase Caller - 30 min visit  - symptom score at followup

## 2021-07-23 NOTE — Progress Notes (Signed)
PCP [name],[name], PA-C   HPI  IOV 02/10/2016  Chief Complaint  Patient presents with   Pulmonary Consult    Pt referred by Dr. Melford Aase for chronic cough x 1 year. Pt states she feels she has a tickle that is causing her dry cough. Pt states she has DOE when climbing stairs. Pt deneis CP/tightness.     81 year old female with hemochromatosis homozygous gene followed by Dr. Beryle Beams (last phlebotomy many years ago and is on serial monitoring) with children and siblings with active disease. She has also atrial fibrillation followed by Dr. Caryl Comes. Reports insidious onset of chronic cough in the last 1 year. It is stable since onset. It fluctuates between mild and severe in severity. It is mostly dry in quality. Mostly present in the daytime but sometimes also wakes her up at night. It is definitely not progressive. It is episodic and present every day. Aggravated by talking sometimes and then the throat feels dry associated with tickle in the throat and also sensation the cough is coming from the upper chest retrosternally and sometimes relieved by drinking water or chewing on a lozenge. Also aggravated by seasonal changes particularly in the spring and the fall which makes her think she has allergies. There is sometimes associated gag. She also reports nonspecific occasional wheezing and associated shortness of breath that is nonspecific but present with exertion and relieved by rest No other clear cut aggravating or relieving factors.   cough associated history  - Medications: She is not on fish oil or ACE inhibitors - Sinus drainage: She does admit to spring allergies. She did have something removed from her hard palate several years ago by ENT does not know details - Acid reflux: She says she is significant acid reflux and is on Prilosec. Without which she'll have significant symptoms - Pulmonary disease: FenO 12 02/10/2016  and normal. She denies any personal history of asthma or  pulmonary fibrosis of COPD or emphysema smoked one pack per day started smoking at age 61 and quit in 1999. Making a 42 pack smoking history. Chest x-ray 08/26/2014 personally visualized is clear - Tobacco :  reports that she quit smoking about 18 years ago. She has a 42.00 pack-year smoking history. She has never used smokeless tobacco.       OV 02/18/2016  Chief Complaint  Patient presents with   Follow-up    Pt here after PFT and HRCT. Pt denies changes in SOB and cough. Pt denies any new complaints at this time.    Follow-up chronic cough. This visit is to follow-up on test results of function test and high resolution CT chest which are described below9 results show isolated reduction in diffusion capacity which can be explained by possible early ILD and also emphysema. There are also new findings of nodule 6 mm bilaterally on lower lobes. She prefers a very cautious approach to this evaluation.     Pulmonary function test 02/12/2016 - FVC 2.8 cm/101%, FEV1 1.9 L/91%. Ratio 60/90%. Total lung capacity 115%. DLCO reduced at 14.5/60%. She is isolated reduction in diffusion capacity    HRCT chest 02/16/16 IMPRESSION: 1. Mild patchy subpleural reticulation in both lungs with a basilar predominance. No significant traction bronchiectasis. No frank honeycombing. Findings could represent an interstitial lung disease such as nonspecific interstitial pneumonia (NSIP), with early usual interstitial pneumonia (UIP) not excluded. A follow-up high-resolution chest CT study in 12 months is recommended to assess temporal pattern stability. 2. Bilateral lower  lobe solid pulmonary nodules, largest 6 mm. Non-contrast chest CT at 3-6 months is recommended. If the nodules are stable at time of repeat CT, then future CT at 18-24 months (from today's scan) is considered optional for low-risk patients, but is recommended for high-risk patients. This recommendation follows the consensus statement:  Guidelines for Management of Incidental Pulmonary Nodules Detected on CT Images:From the Fleischner Society 2017; published online before print (10.1148/radiol.[id number]). 3. Additional findings include mild-to-moderate centrilobular emphysema, aortic atherosclerosis and 2 vessel coronary atherosclerosis.     Electronically Signed   By: Ilona Sorrel M.D.   On: 02/16/2016 14:14    OV 08/31/2016  Chief Complaint  Patient presents with   Follow-up    HRCT was never scheduled. Pt states that the cough is still present >> slightly improved since last OV. Pt states that she feels her allergies has gotten it ramped up again.    Follow-up multifactorial cough associated with autoimmune antibody positive, interstitial lung disease and emphysema previous history of smoking  After last visit in August 2017 she had high resolution CT chest in December 2017 that showed persistence of ILD. I personally visualized the CT chest. He comes for follow-up. She tells me that since August 2017 she has insidious onset of shortness of breath and is progressively worse. It is mild and present only on inclines is not therefore tenderness. In terms of her cough is persistent. It is associated with significant sinus drainage that she thinks is allergy related. She constantly clears her throat. Lab review shows autoimmune antibody positive in August 2017 with scleroderma antibody slightly elevated at 6.4. I referred her to rheumatology but she does not remember this and she did not make this follow-up. She also tells me that she definitely has a long history of [name] phenomena in her fingers but no one has ever formally diagnosed with connective tissu  e disease.  OV 11/28/2016  Chief Complaint  Patient presents with   Follow-up    Pt here after PFT. Pt states her breathing is uchanged since last OV. Pt c/o dry cough.- pt states this has slightly improved since last OV. Pt denies CP/tightness and f/c/s.      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 Since then she has seen rheumatologis Dr. Keturah Barre. I reviewd the notes and also discussed with the patient wa a good understading of what s going on. She tells me that some of the lupus antibodies have been positive correlating with Raynaud. Patient is on beta blocker for atrial fbrillation and it is the preference by the rheumatologist Dr. D that she switc to calcium channel blocker. She has a follow-up appointment pending with her electrophysiologist Dr. Caryl Comes.  She is scleroderma antibody positive but according to her rheumtolgist she does not have dermatologic  Manifestation of the disese. 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 She pulm  function test today and this is stable. In terms of arrest or symptoms she stable. The cough is actually improved. But she does get dyspneic walking up stairs especially a few flights. She does not wnt her to pulmonary habilitation because se exrcises on the treadmill although she does not monitor her saturations.   reports that she quit smoking about 19 years ago. She has a 42.00 pack-year smoking history. she has never used smokeless tobacco.   OV  06/12/2017  Chief Complaint  Patient presents with   Follow-up    Pt states that she has been doing good since last visit. States that she has a "tickle cough" that is sporadic and has SOB that she states is when she climbs 2 flights of stairs. Denies any CP.    Follow-up combined emphysema was interstitial lung disease [autoimmune undifferentiated connective tissue disease interstitial lung disease]. Autoimmune features from May 2018 rheumatology noted with Dr. Keturah Barre: Raynaud phenomenon positive without gangrene, positive anti-cardiolipin and positive IgM and SCL-70 positive without clinical features of  scleroderma    Last seen June 2018. Since then she's stable. Overall she tells me that only problem is a tickle in her throat and slight cough. This is up and down depending on the pollen exposure she gets. She gets dyspneic for climbing few to several flights of stairs. This is unchanged. She did have full function test today and that shows mild significant worsening though overall gradient is only mild.. However she's not feeling this. She has appointment with Dr. Keturah Barre  pending    OV 07/18/2017  Chief Complaint  Patient presents with   Follow-up    HRCT done 07/03/17.  Pt states she is the same as she was at last visit. SOB with exertion.    Follow-up combined emphysema was interstitial lung disease [autoimmune undifferentiated connective tissue disease interstitial lung disease]. Autoimmune features from May 2018 rheumatology noted with Dr. Keturah Barre: Raynaud phenomenon positive without gangrene, positive anti-cardiolipin and positive IgM and SCL-70 positive without clinical features of scleroderma    AMARIANNA ABPLANALP presents for follow-up.  She is here to discuss the results of a high-resolution CT scan of the chest.  The scan was reviewed by Dr. Rosario Jacks thoracic radiology who feels that patient has probable UIP pattern that is definitely progressive compared to August 2017 although there is no comment about progression since June 2018 CT scan.  The pulmonary nodules itself are stable since August 2017 and are likely benign.  The change in the CT scan which is mild progression pulmonary fibrosis corresponds with the pulmonary function test that shows mild progression.  She is now here with her daughter Junie Panning and her husband.  She also states that 1 of her other daughters has rheumatoid arthritis and is on TNF alpha blockade.  All her 3 daughters have hemo-chromatosis    OV 08/29/2017  Chief Complaint  Patient presents with   Follow-up    Pt states she has been doing good since last visit. Pt  recently went to [place] and had some mild problems with SOB due to heat.  Pt was prescribed cellcept and bactrim but has not yet started meds due to questions with other meds she is taking.    81 year old female with ILD secondary to autoimmune disease clinically suspicious of scleroderma but also previous history of hemochromatosis with family history of hemochromatosis.  She is here with her husband.  This visit is only a discussion visit.  Because she has many questions about starting CellCept before she actually does.  We did hepatitis virus panel, QuantiFERON gold, G6PD and all this is normal.  Lab work is normal.  I checked with [facility] pharmacist about interactions and was cleared for her to start CellCept.  The only recommendation was for patient to come off Prilosec because of reduced effect of CellCept.  Patient questions revolved around CellCept: She does not want to come off PPI because of hiatal hernia and has had  bad acid reflux.  So I would not Willis Modena asking for any alternative PPI.  In addition she states she takes multiple multivitamins including Biotene, vitamin D3, vitamin D, vitamin V67 and folic acid.  She also takes Allegra occasionally.  She wants to make sure it is all okay with CellCept.  She has not had a flu shot today and she is asking if she should have it.  She has never had flu shot before.  She is also wondering about anticoagulation in the setting of CellCept and thyroid issues in the setting of Bactrim.  In terms of her genetics: Initially I corresponded with our local geneticist who thought she might be better served at [facility] but the [facility] genetics person wrote to me saying that patient should first be seen by [facility] rheumatology and hematology.  Patient's not so sure she wants to see the subspecialist.  She will speak to Dr. Beryle Beams her local hematologist to see if it is worthwhile seeing the hematology department at [facility].   Lungs/Pleura: Moderate centrilobular emphysema. Mild basilar predominant subpleural reticulation and ground-glass, increased from 02/16/2016. No traction bronchiectasis/ bronchiolectasis, architectural distortion or honeycombing. Side-by-side nodules in the medial left lower lobe measure up to 6 mm (series 3, image 95), unchanged from 02/16/2016 and considered benign. No air trapping. No pleural fluid. Airway is unremarkable.  IMPRESSION: 1. Mild progression in basilar predominant subpleural fibrosis from 02/16/2016, raising suspicion for usual interstitial pneumonitis. 2. Aortic atherosclerosis (ICD10-170.0). Moderate coronary artery calcification. 3.  Emphysema (ICD10-J43.9).     Electronically Signed   By: Lorin Picket M.D.   On: 07/03/2017 09:54    OV 09/19/2017  Chief Complaint  Patient presents with   Follow-up    Pt states she has been doing good. Currently on cellcept and bactrim and has been doing well on it once put back on omeprazole. Pt still has the cough and sometimes when trying to take a deep breath can't fully get all O2 out.   Krisalyn Yankowski returns for follow-up.  This is for autoimmune interstitial lung disease.  She was started on CellCept recently.  She is here to follow-up for therapeutic drug monitoring.  Because she was on omeprazole and we were initiating CellCept with Bactrim we asked her to hold off on omeprazole because the omeprazole impairs drug levels of CellCept.  However she tried it for a week and her acid reflux was significant despite ranitidine so she went back on omeprazole.  Pharmacist Willis Modena is advising changing the CellCept to equivaekbtdose of Myfortic. So far tolerating cellcept/pred/bactrimm well. Has seen dR G in heme and on observation. Had PFT today on cellcept/pred/bactrim and is better! Continue siwht spriva  OV 10/31/2017  . Chief Complaint  Patient presents with   Follow-up    Pt currently taking  myfortic and states she has been well on that. Pt states she has some mild nausea and has some problems with sleeping at night. SOB is stable,mild coughing. Denies any CP.    Follow-up progressive interstitial lung disease with SCL-70 antibody positive and Raynard Follow-up associated emphysema.  Last visit September 19, 2017.  At that time she had intolerance to the CellCept therefore we switched her to Myfortic.  She is here to report for follow-up about this.  She is tolerating Myfortic just well.  She had some transient insomnia for a few days last week but this resolved.  She only has mild intermittent occasional nausea with the Myfortic but  otherwise is tolerating it really well at the low-dose of 360 mg twice daily.  She continues on prednisone 10 mg daily and Bactrim 3 times a week.  She is due for blood work today.  There are no other new issues.  She continues on Spiriva for her emphysema   OV 02/01/2018  Chief Complaint  Patient presents with   Follow-up    PFT performed today.  Pt stated other than having shingles earlier, things have been doing good for her. Pt states SOB is stable and states she still has an occ cough.    Follow-up progressive interstitial lung disease with SCL-70 antibody positive and Raynud - IPAF Follow-up associated emphysema   CAROLEANN CASLER presents for routine follow-up with interstitial lung disease clinic. She has the above problems. In the interim she tells me she had developed shingles on the right neck but this is resolved. This is despite having zoster vaccine in 2012. She was yet to have the bnnew  shingles vaccine.in any event currently she is feeling well. She only has occasional post residual neuropathic pain. In terms of her shortness of breath it is stable and minimal. Walking desaturation test shows stability. Pulmicort function test below show stability. She is tolerating the mycophenolate myfortic well at 360 mg twice daily and prednisone 10 mg  daily and Bactrim for PCP prophylaxis. Overall she feels stable and feels that it is best medicines on touch in terms of dosing change  OV 08/14/2018  Subjective:  Patient ID: [name], female , DOB: 02-10-1941 , age 17 y.o. , MRN: [id number] , ADDRESS: [place] [id number]   08/14/2018 -   Chief Complaint  Patient presents with   Follow-up    PFT performed today.  Pt states she is slowly getting better after her recent pna. States she still has some issues with SOB and an occ cough. Pt also states that she feels like she has a weight on her chest at times.    Follow-up progressive interstitial lung disease with SCL-70 antibody positive and Raynud - IPAF - on myfortic since march 2019 with pred 10 and bactrim  Follow-up associated emphysema - on spiriva    HPIfortic MAXIMINA PIROZZI 81 y.o. -returns for 45-monthfollow-up of her interstitial lung disease associated with emphysema.  She presents with her middle daughter EJunie Panningwho is met me before.  She continues on Myfortic and prednisone 10 mg and Bactrim prophylaxis.  She continues on Spiriva.  She had an episode of atrial fibrillation according to review of the chart and my on history with in the middle of January 2020.  At this time she had a chest x-ray that showed asymptomatic upper lobe pneumonia.  I personally visualized the chest x-ray and confirmed the findings.  Antibiotic was not prescribed.  Then I follow this up with a CT scan of the chest [we canceled her elective colonoscopy].  CT chest showed improved pneumonia findings.  I prescribed even though she was feeling well cephalexin for a week.  She says she took it.  She continues to feel well except she has baseline amount of symptoms that is documented below.  Of note the high-resolution CT chest showed an improvement in ILD compared to a year ago.  In fact her pulmonary function tests are also showing stability/improvement as documented below.  She is very  happy about this outcome with immunosuppression .  However, she is a little bit unsure why she is  symptomatic in terms of shortness of breath and cough.  She is very concerned that her fibrosis is getting worse.  She wants to have closer follow-up than every 6 months.   IMPRESSION: HRCT Jan 2020 1. Vaguely bandlike patchy consolidation and ground-glass opacity at the periphery of the right upper and superior segment right lower lobes, new since 07/03/2017 chest CT, improving compared to 07/10/2018 chest radiograph, favoring resolving pneumonia with evolving postinfectious scarring. 2. Previously described basilar predominant subpleural reticulation and ground-glass attenuation is minimal and appears decreased in the interval. No bronchiectasis or honeycombing. Findings may represent an interstitial lung disease such as nonspecific interstitial pneumonia (NSIP) or less likely early usual interstitial pneumonia (UIP). Findings are indeterminate for UIP per consensus guidelines: Diagnosis of Idiopathic Pulmonary Fibrosis: An Official ATS/ERS/JRS/ALAT Clinical Practice Guideline. [employer], Iss 5, [phone], Feb 25 2017. 3. Moderate centrilobular emphysema with mild diffuse bronchial wall thickening, suggesting COPD. 4. Two-vessel coronary atherosclerosis. 5. Small hiatal hernia.   Aortic Atherosclerosis (ICD10-I70.0) and Emphysema (ICD10-J43.9). ROS - per HPI    OV 04/22/2019  Subjective:  Patient ID: [name], female , DOB: [birth year] , age 76 y.o. , MRN: [id number] , ADDRESS: [place] [id number]   Follow-up progressive interstitial lung disease with SCL-70 antibody positive and Raynud - IPAF - on myfortic since march 2019 with pred 5 and bactrim 3x/week  Follow-up associated emphysema - on spiriva   04/22/2019 -   Chief Complaint  Patient presents with   Follow-up    Patient reports that her breathing is doing well at this  time.      HPI THEOLA CUELLAR 81 y.o. -presents for follow-up.  Last seen by myself in early part of 2020 before the pandemic.  At that time CT chest suggested possible progression.  She saw a nurse practitioner in the interim after the onset of the pandemic.  She was anxious about the progression of her ILD so she asked for a CT scan in less than 1 year.  A CT scan of the chest was done in September 2020.  This is deemed stable since early part of the year but with possible progression versus stability since January 2019.  In terms of her symptoms she continues to be stable with very minimal shortness of breath and cough.  Her pulmonary function test below also shows stability.  Her walking desaturation test also shows stability.  She is tolerating her Myfortic, prednisone and 3 times a week of Bactrim just fine.  There are no side effects.  Last blood work was in August 2020 and this was reviewed.  We discussed the possibility of switching to antifibrotic nintedanib instead of taking immunosuppressants.  The rational for this would be it is a safer drug with less long-term side effect such as lack of immunosuppression.  However she feels her current regimen is working well for her.  She also feels that she would benefit more from a immunosuppressive drug because of her autoimmune antibodies and therefore she is more inclined to continue with her current immunosuppressive regimen of prednisone 5 mg/day and Myfortic and Bactrim 3 times a week.        OV 07/04/2019  Subjective:  Patient ID: [name], female , DOB: 01/21/1941 , age 76 y.o. , MRN: [id number] , ADDRESS: [place] [id number]   Follow-up mildy progressive - stable interstitial lung disease with SCL-70 antibody positive and Raynud -  IPAF - on myfortic since march 2019 with pred 5 and bactrim 3x/week  Follow-up associated emphysema  Follow-up Raynard  Has associated hemochromatosis  Has associated  atrial fibrillation on Xarelto   07/04/2019 -   Chief Complaint  Patient presents with   Follow-up    Pt states she has been doing well since last visit and denies any complaints.     HPI ALASHIA BROWNFIELD 81 y.o. -last visit was in October 2020.  At that time she was doing well.  Given her continued immunosuppression with prednisone and Myfortic we discussed the possibility of switching the Myfortic to the nintedanib but understanding that there uncertainties whether this is superior approach and if she could end up with temporary side effects of the nintedanib.  Based on that she decided to continue with prednisone, Myfortic and Bactrim as she was doing because things are working well.  However she later called in and wanted this change to nintedanib.  We did this change to nintedanib but then her co-pay ended up being over 500+ dollars and then she started also getting worried about the side effects with nintedanib particularly GI.  She is also on Xarelto for atrial fibrillation.  Therefore she made this visit ahead of schedule to discuss the possibility of making the switch.  I understand from her that it will be 500+ dollars every month with nintedanib as a co-pay.  In addition she is worried about the superiority of taking nintedanib.  She fully understands that Myfortic over length of time can carry cancer risk and immunosuppression risk but at this point in time she feels well and she is tolerating it well.  She also understands that nintedanib can carry temporary GI side effects.  She also understands that it may not be a superior approach.  She understands it could be an equivalent approach and definitely lower immunosuppression.  Her main systemic immunosuppressive features are lung disease and Raynaud.  She is worried this might flareup.  In terms of her symptoms there is stable as documented below.  Her walking desaturation test is also stable.   Last pulmonary function test was in August  r 2020 Last CT scan of the chest was in September 2020 Last blood work October 2020 Last echocardiogram 2012.  ROS - per HPI  OV 10/22/2019  Subjective:  Patient ID: [name], female , DOB: 02-Jun-1941 , age 27 y.o. , MRN: [id number] , ADDRESS: [place]  [id number]   Follow-up mildy progressive - stable interstitial lung disease with SCL-70 antibody positive and Raynud - IPAF - on myfortic since march 2019 with pred 5 and bactrim 3x/week  Follow-up associated emphysema  Follow-up Raynaud  Has associated hemochromatosis  Has associated atrial fibrillation on Xarelto    10/22/2019 -   Chief Complaint  Patient presents with   Follow-up    PFT performed today. Pt states she has been doing good since last visit. Denies any complaints with breathing.     HPI KERRI KOVACIK 81 y.o. -returns for routine follow-up.  Overall doing well.  She feels shortness of breath is stable.  She feels she is tolerating Myfortic and prednisone fine no new interim issues she has had a Covid vaccine.  She continues on high risk immunosuppressive therapy.  There is some mild diarrhea with the Myfortic but it is tolerable.  The shortness of breath scale is stable walking desaturation test is stable.  Her pulmonary function test shows stable FVC with  a possible reduction in diffusion capacity.  I do not know what to make of this at this point in time.  It could be a technical issue because she is feeling stable.  She did have echocardiogram that showed grade 2 diastolic dysfunction.   She continues on her Spiriva for associated emphysema.  ROS - per HPI     OV 03/03/2020   Subjective:  Patient ID: [name], female , DOB: 04-23-1941, age 11 y.o. years. , MRN: [id number],  ADDRESS: [place] [address] PCP  Kathyrn Lass, MD Providers : Treatment Team:  Attending Provider: Brand Males, MD   Chief Complaint  Patient presents with    Follow-up    f/u pft     Follow-up mildy progressive - stable interstitial lung disease with SCL-70 antibody positive and Raynud - IPAF - on myfortic since march 2019 with pred 5 and bactrim 3x/week  Follow-up associated emphysema  Follow-up Raynaud  Has associated hemochromatosis  Has associated atrial fibrillation on Xarelto  Mild associated diastolic dysfunction and echo January 2021    HPI NAILEA WHITEHORN 81 y.o. -returns for follow-up.  Overall she is doing well.  However in the last 1-2 months she says she is slightly more short of breath than usual.  She is also having slight increase in dry cough.  Based on her subjective symptom questionnaires it seems the cough itself is stable but she is definitely saying it is worse.  She is seeing the dyspnea is overall stable compared to the cough but looking at the symptom questionnaire is maybe it is a little worse.  Is hard to discern.  It has been approximately 1 year since she had a high-resolution CT chest.  She is wondering if she can have this repeated.  She is on immunosuppression.  She is also noticing -that at home her pulse oximetry on her finger varies a lot [she does have [name]]) and sometimes can be as low as 88% - 93%.  She does acknowledge that the circulation in her hands is not as good.  Pulse ox here at rest was normal.  She had pulmonary function test that shows stability.-With the FVC although I am not sure what to make of the DLCO.  She has had a Covid booster vaccine  Last echocardiogram January 2021 with mild diastolic dysfunction    IMPRESSION: 1. Very mild basilar predominant subpleural reticulation and ground-glass, similar to minimally progressive from 07/03/2017. Nonspecific interstitial pneumonitis is favored. Findings are indeterminate for UIP per consensus guidelines: Diagnosis of Idiopathic Pulmonary Fibrosis: An Official ATS/ERS/JRS/ALAT Clinical Practice Guideline. [place], Iss 5, [phone], Feb 25 2017. 2. Aortic atherosclerosis (ICD10-170.0). Coronary artery calcification. 3. Enlarged pulmonic trunk, indicative of pulmonary arterial hypertension. 4.  Emphysema (ICD10-J43.9).     Electronically Signed   By: Lorin Picket M.D.   On: 03/20/2019 14:47  Echocardiogram -January 2021: Shows grade 2 diastolic dysfunction but otherwise unremarkable.   ROS - per HPI  HPI SHALEENA CRUSOE 81 y.o. -last visit was in September 2021.  She continues to do well overall.  Shortness of breath symptom question is stable.  Walking desaturation test is stable.  She is compliant with her medications.  In September 2021 there is concern that her ILD was getting worse because of change in PFT.  So we did a CT scan of the chest radiologist reported this to be stable since 2017.  Most recently she  had blood work in December 2021 chemistry panel is normal and I reviewed it.  CBC panel is also normal with stable mild anemia hemoglobin 12.4 g%.  She is up-to-date with her COVID vaccine including booster.  She had pulmonary function test today that showed some fluctuation.  Her main concern is that she is coughing more  In terms of her cough it is dry and has a barking quality to it.  Or laryngeal quality to it.  It is mostly at day and night but never wakes her up at night.  When she has it at night it is only when she wakes up.  Talking makes it worse.  Laughing makes it worse.  Drinking water makes it better.  She thinks it is progressive.  Symptom score showed the cough is getting worse and deep.  She has no fever or chills.  No desaturations.  We discussed the possibility of opportunistic infection but she rightfully questant if it could be opportunistic infection if everything else is stable.  There is no wheezing.  She is wondering if it could be because of allergies.  She has seasonal allergies.  Review of the records indicate this has not been investigated.  She is  willing to have an investigation for this.  We discussed the possibility of cough neuropathy.  She recollects has had gabapentin before when she had shingles.  She did not have any side effects from it.     CT Chest data sept 2021  TECHNIQUE: Multidetector CT imaging of the chest was performed following the standard protocol without intravenous contrast. High resolution imaging of the lungs, as well as inspiratory and expiratory imaging, was performed.   COMPARISON:  09/09/2019, 03/20/2019, 07/03/2017, 12/06/2016, 06/15/2016   FINDINGS: Cardiovascular: Aortic atherosclerosis. Normal heart size. Three-vessel coronary artery calcifications. No pericardial effusion.   Mediastinum/Nodes: No enlarged mediastinal, hilar, or axillary lymph nodes. Thyroid gland, trachea, and esophagus demonstrate no significant findings.   Lungs/Pleura: Moderate centrilobular emphysema. No significant interval change in subtle irregular peripheral interstitial opacity predominantly seen in the bilateral lung bases, although also noted in nondependent portions of the right middle lobe and lingula. Mild lobular air trapping on expiratory phase imaging. Stable, definitively benign and faintly rim calcified 6 mm nodule of the dependent left lower lobe (series 5, image 86). No pleural effusion or pneumothorax.   Upper Abdomen: No acute abnormality.   Musculoskeletal: No chest wall mass or suspicious bone lesions identified.   IMPRESSION: 1. No significant interval change in subtle irregular peripheral interstitial opacity predominantly seen in the bilateral lung bases, although also noted in nondependent portions of the right middle lobe and lingula. There is no appreciable change in these findings on multiple prior examinations dating back to 2017. Bland post infectious or inflammatory scarring is favored, although minimal fibrotic interstitial lung disease is not excluded and if characterized by  ATS pulmonary fibrosis criteria, findings are consistent with an early "indeterminate for UIP" pattern. Consider ongoing follow-up CT to assess for stability of fibrotic findings. Findings are indeterminate for UIP per consensus guidelines: Diagnosis of Idiopathic Pulmonary Fibrosis: An Official ATS/ERS/JRS/ALAT Clinical Practice Guideline. [place], Iss 5, [phone], Feb 25 2017. 2. Emphysema (ICD10-J43.9). 3. Coronary artery disease.  Aortic Atherosclerosis (ICD10-I70.0).     Electronically Signed   By: Eddie Candle M.D.   On: 03/16/2020 14:12  OV 02/23/2021  Subjective:  Patient ID: [name], female , DOB: 1941/01/30 , age 67 y.o. ,  MRN: [id number] , ADDRESS: [place] [address] PCP Kathyrn Lass, MD Patient Care Team: Kathyrn Lass, MD as PCP - General (Family Medicine) Deboraha Sprang, MD as PCP - Cardiology (Cardiology) Annia Belt, MD as Consulting Physician (Hematology) Deboraha Sprang, MD as Consulting Physician (Cardiology) Richmond Campbell, MD as Consulting Physician (Gastroenterology)  This Provider for this visit: Treatment Team:  Attending Provider: Brand Males, MD    02/23/2021 -   Chief Complaint  Patient presents with   Follow-up    Pt states she has been doing okay since last visit and denies any complaints.    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  Follow-up associated emphysema  Follow-up Raynaud  Has associated hemochromatosis  Has associated atrial fibrillation on Xarelto  Mild associated diastolic dysfunction and echo January 2021  HPI CARMISHA LARUSSO 81 y.o. -returns for her 31-monthfollow-up.  Last visit with me was in February 2022.  Is   She continues on Myfortic prednisone and Bactrim.  We held off on nintedanib  given stability.  She did have spirometry in February 2022.?  There is a slight decline but she feels stable.  Symptom scores are stable.  Walking desaturation test is below and is stable versus a three-point drop that is?  Significant..  Last CT scan September 2021.  But clinically she feels stable.  We held off on nintedanib last visit because of stability.       OV 07/23/2021  Subjective:  Patient ID: PAnastasia Fiedler female , DOB: 112/03/1941, age 81y.o. , MRN: [id number], [address] [address] [id number] MKathyrn Lass MD Patient Care Team: MKathyrn Lass MD as PCP - General (Family Medicine) KDeboraha Sprang MD as PCP - Cardiology (Cardiology) GAnnia Belt MD as Consulting Physician (Hematology) KDeboraha Sprang MD as Consulting Physician (Cardiology) MRichmond Campbell MD as Consulting Physician (Gastroenterology)  This Provider for this visit: Treatment Team:  Attending Provider: RBrand Males MD    07/23/2021 -   Chief Complaint  Patient presents with   Follow-up    Pt states she has been doing okay and denies any complaints.    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  Follow-up associated emphysema  Follow-up Raynaud  Has associated hemochromatosis  Has associated atrial fibrillation on Xarelto  Mild associated diastolic dysfunction and echo January 2021  HPI PSHAYNNA HUSBY89y.o. -returns for 654-monthollow-up.  Last seen in August 2022.  She feels since then dyspnea slightly worse.  This is reflected in the symptom score below.  In December 2022 he did have COVID and she recovered from it.  She does not think the decline is because of the COVID.  She continues on her prednisone Bactrim and Myfortic.  Her last CT scan of the chest was in September 2021.  Last  echocardiogram was in January 2021.  Last pulmonary function test was almost 1 year ago in February 2022.  She is wondering about repeat investigations.  She is concerned about worsening ILD.    Of note she just turned [age]ears old.   SYMPTOM SCALE - ILD 08/14/2018  04/22/2019  07/04/2019  10/22/2019  03/03/2020 139# 08/18/2020  02/23/2021  07/23/2021 Covid dec 2023  O2 use *RA         Shortness of Breath 0 -> 5 scale with 5 being worst (score 6 If unable to do)         At rest 1 0 0 0.5 1 0 0.5 1  Simple tasks - showers, clothes change, eating, shaving 1.5 0 .5 0._0 0.5 1.5  Household (dishes, doing bed, laundry) 1.5 1 0.5 0._1 0.5 1.5  Shopping 1.5 0 0/5 0._2 Walking keeping up with others of same age 41 0 0.5 0.5 1.5 1._3 Walking up Stairs 2.5 1 0.5 0/5 1.5 1.5 1.5 2  Total (40 - 48) Dyspnea Score 10- 2 2._4 How bad is your cough? 1.5 1.5 0.5 1 1.5 3 1.5 2  How bad is your fatigue 1._5 1.5 2 1.5 2  nausea    0 0 0.5 0 0  vomit    0 0 0 0 0  dairrhea    2.5 0 0.5 1.5 3  anxiety    0 0 0 0 0  deoresoj    0 0 0 0 0    Simple office walk 185 feet x  3 laps goal with forehead probe 07/04/2019  10/22/2019  03/03/2020 Finger probe 02/23/2021   O2 used ra ra ra  ra  Number laps completed _6 Comments about pace avg  avg   Resting Pulse Ox/HR 98% and 74/min 100% and 66 96% and 80 99% and 69  Final Pulse Ox/HR 98% and 87/min 97% and 81 95% and 83 96% and 78  Desaturated </= 88% no no no   Desaturated <= 3% points no no no yes  Got Tachycardic >/= 90/min no no no   Symptoms at end of test Mild dyspnea Very mild dyspnea no no  Miscellaneous comments x      PFT  PFT Results Latest Ref Rng & Units 08/18/2020 02/28/2020 10/22/2019 02/18/2019 08/14/2018 02/01/2018 09/19/2017  FVC-Pre L 2.78 2.90 2.83 2.82 2.92 2.87 3.04  FVC-Predicted Pre % 105 108 106 104 108 105 111  FVC-Post L - - - - - - -  FVC-Predicted Post % - - - - - - -  Pre FEV1/FVC % % 68 66 69 67 67  68 66  Post FEV1/FCV % % - - - - - - -  FEV1-Pre L 1.88 1.92 1.96 1.89 1.95 1.95 2.02  FEV1-Predicted Pre % 96 96 98 93 96 96 98  FEV1-Post L - - - - - - -  DLCO uncorrected ml/min/mmHg 11.74 11.88 12.46 15.31 13.19 13.07 13.83  DLCO UNC% % 62 63 66 81 70 54 57  DLCO corrected ml/min/mmHg 11.74 11.88 12.46 - - - 14.29  DLCO COR %Predicted % 62 63 66 - - - 59  DLVA Predicted % 65 66 73 79 68 60 61  TLC L - - - - - - -  TLC % Predicted % - - - - - - -  RV % Predicted % - - - - - - -       has a past medical history of Atrial fibrillation ([place]), Chest pain, Depression with anxiety, GERD (gastroesophageal reflux disease), Hemochromatosis, History of syncope, and Hypothyroidism.   reports that she quit smoking about 24 years ago. Her smoking  use included cigarettes. She has a 42.00 pack-year smoking history. She has never used smokeless tobacco.  Past Surgical History:  Procedure Laterality Date   MOUTH SURGERY     SHOULDER ARTHROSCOPY Left    TOTAL ABDOMINAL HYSTERECTOMY     VARICOSE VEIN SURGERY      No Known Allergies  Immunization History  Administered Date(s) Administered   Fluad Quad(high Dose 65+) 02/18/2019   Influenza Split 03/27/2019   Influenza, High Dose Seasonal PF 08/29/2017, 04/26/2018, 04/29/2020, 05/12/2021   PFIZER(Purple Top)SARS-COV-2 Vaccination 07/22/2019, 08/12/2019, 03/11/2020   Pneumococcal Conjugate-13 06/12/2014   Pneumococcal Polysaccharide-23 06/27/2010, 04/25/2018   Tdap 03/16/2011, 07/05/2019   Zoster Recombinat (Shingrix) 04/08/2019   Zoster, Live 06/27/2010, 04/08/2019    Family History  Problem Relation Age of Onset   Stroke Mother    Heart disease Father 61   Hemochromatosis Brother    Breast cancer Neg Hx      Current Outpatient Medications:    acetaminophen (TYLENOL) 325 MG tablet, Take 650 mg by mouth every 6 (six) hours as needed for mild pain or headache., Disp: , Rfl:    apixaban (ELIQUIS) 5 MG TABS tablet, TAKE 1 TABLET(5 MG)  BY MOUTH TWICE DAILY, Disp: 180 tablet, Rfl: 1   atorvastatin (LIPITOR) 10 MG tablet, Take 10 mg by mouth daily., Disp: , Rfl:    Cyanocobalamin (VITAMIN B 12 PO), Take 1,000 mcg by mouth daily., Disp: , Rfl:    flecainide (TAMBOCOR) 100 MG tablet, TAKE 1 TABLET BY MOUTH TWICE DAILY, Disp: 180 tablet, Rfl: 3   folic acid (FOLVITE) 1 MG tablet, Take 1 mg by mouth daily., Disp: , Rfl:    levothyroxine (SYNTHROID, LEVOTHROID) 112 MCG tablet, Take 112 mcg by mouth daily., Disp: , Rfl:    methocarbamol (ROBAXIN) 500 MG tablet, Take 1 tablet (500 mg total) by mouth daily as needed for muscle spasms., Disp: 30 tablet, Rfl: 2   metoprolol succinate (TOPROL-XL) 25 MG 24 hr tablet, TAKE 1 TABLET BY MOUTH  DAILY, Disp: 90 tablet, Rfl: 2   mycophenolate (MYFORTIC) 360 MG TBEC EC tablet, TAKE 1 TABLET BY MOUTH TWICE DAILY, Disp: 180 tablet, Rfl: 3   omeprazole (PRILOSEC) 20 MG capsule, Take 20 mg by mouth 2 (two) times daily., Disp: , Rfl:    ondansetron (ZOFRAN ODT) 4 MG disintegrating tablet, Take 1-2 tablets three times daily as needed, Disp: 42 tablet, Rfl: 1   predniSONE (DELTASONE) 5 MG tablet, TAKE 1 TABLET(5 MG) BY MOUTH DAILY WITH BREAKFAST, Disp: 90 tablet, Rfl: 2   SPIRIVA RESPIMAT 2.5 MCG/ACT AERS, INHALE 2 PUFFS INTO THE LUNGS DAILY, Disp: 4 g, Rfl: 10   sulfamethoxazole-trimethoprim (BACTRIM DS) 800-160 MG tablet, TAKE 1 TABLET BY MOUTH ON MONDAY, WEDNESDAY AND FRIDAY, Disp: 90 tablet, Rfl: 3   VITAMIN D PO, Take 5,000 Units by mouth 4 (four) times a week., Disp: , Rfl:       Objective:   Vitals:   07/23/21 1414  BP: 108/64  Pulse: 68  Temp: 97.8 F (36.6 C)  TempSrc: Oral  SpO2: 98%  Weight: 143 lb (64.9 kg)  Height: 5' 3.5" (1.613 m)    Estimated body mass index is 24.93 kg/m as calculated from the following:   Height as of this encounter: 5' 3.5" (1.613 m).   Weight as of this encounter: 143 lb (64.9 kg).  _0 @  Filed Weights   07/23/21 1414  Weight: 143 lb  (64.9 kg)     Physical Exam  General: No  distress. Looks well Neuro: Alert and Oriented x 3. GCS 15. Speech normal Psych: Pleasant Resp:  Barrel Chest - no.  Wheeze - no, Crackles - YES, No overt respiratory distress CVS: Normal heart sounds. Murmurs - no Ext: Stigmata of Connective Tissue Disease - no HEENT: Normal upper airway. PEERL +. No post nasal drip        Assessment:       ICD-10-CM   1. ILD (interstitial lung disease) ([place])  J84.9     2. 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 High risk medication use  Z79.899     4. Therapeutic drug monitoring  Z51.81     5. Centrilobular emphysema ([place])  J43.2     6. Chronic cough  R05.3     She is on high risk medication.  This requires intensive monitoring..  There is also concern for disease progression.  We will monitor for this as well.     Plan:     Patient Instructions     ICD-10-CM   1. ILD (interstitial lung disease) ([place])  J84.9   2. High risk medication use  Z79.899   3. Therapeutic drug monitoring  Z51.81   4. 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 [name] phenomenon without gangrene  I73.00   6. Centrilobular emphysema ([facility])  J43.2       Interstitial pulmonary disease ([facility]) Scl-70 antibody positive [name] phenomenon without gangrene High risk medication use Therapeutic drug monitoring  Symptoms slightly worse than baseline. Unclear if covid in dec 2022 is making this worse or fibrosis is slight worse.  - Last CT scan of the chest in September 2021 - Last echo Jan 2021 - Last PFT feb 2022  Plan  - cotninue  prednisone to 5 mg per day +  myfortic and bactrim at current dose - do cbc, bmet, LFT 07/23/2021 -do HRCT supine and prone next 1-3 months - do ECHO next 1-3 months  -do spiroemtry and dlco next 1-3 months  - do simple walking desaturation test 07/23/2021 or nex tivist -     Pulmonary emphysema, unspecified emphysema type ([place]) -  Continue spiriva  Chronic cough Irritable  Larynx History of seasonal allergies  -Based on history I suspect chronic cough is due to cough neuropathy otherwise call irritable larynx.  But he has seasonal allergies playing a role in this is open question. Other reasons  interstitial lung disease [as above and stable] the underlying emphysema [stable]  - Glad currently very mild and improved  Plan -expectant followup   Followup -1-3 months with Dr Chase Caller - 30 min visit  - symptom score at followup    SIGNATURE    Dr. Brand Males, M.D., F.C.C.P,  Pulmonary and Critical Care Medicine Staff Physician, [place] Director - Interstitial Lung Disease  Program  Pulmonary [place] at [place], [place], [address]  Pager: [phone], If no answer or between  15:00h - 7:00h: call [phone] Telephone: [phone]  2:35 PM 07/23/2021

## 2021-07-29 ENCOUNTER — Telehealth: Payer: Self-pay | Admitting: Internal Medicine

## 2021-07-29 NOTE — Telephone Encounter (Signed)
Brand Males, MD  07/27/2021  5:47 PM EST     Blood labs show slight increase of worsening and anemia.  Hemoglobin now 10.8 g%.  2 years ago she was 12 g% and then she was running 11.3-11.8 g%.  This the first time it is gone below 11.  She needs to talk to primary care physician    Patient is aware of results and voiced his understanding.  She will contact PCP. Nothing further needed.

## 2021-08-06 NOTE — Progress Notes (Signed)
Office Visit Note  Patient: [name]             Date of Birth: [birth year]           MRN: [id number]             PCP: Kathyrn Lass, MD Referring: Kathyrn Lass, MD Visit Date: 08/13/2021 Occupation: @GUAROCC @  Subjective:  [name]   History of Present Illness: [name] is a 81 y.o. female with history of scleroderma and ILD. She is taking Myfortic 360 mg 1 tablet by mouth twice daily, Bactrim DS on Mondays, Wednesdays, and Fridays and prednisone 5 mg 1 tablet by mouth daily by Dr. Chase Caller.  She is tolerating these medications without any side effects.  She has not missed any doses recently.  Patient reports that she was diagnosed with COVID-19 in December 2022.  She has had some increased shortness of breath intermittently since then.  She was evaluated by Dr. Chase Caller on 07/23/2021 who ordered an updated high-resolution chest CT and set up a future order for echocardiogram.  She is scheduled for a high-resolution chest CT on 08/24/2021. She reports that she has been having more frequent and severe symptoms of [name] recently.  She states last week her right thumb was discolored consistently for several days.  During these episodes she experiences numbness and pain in her fingertips.  She denies any digital ulcerations or signs of gangrene.  She has not had any symptoms of [name] in her feet.  She denies any increase skin tightness or thickening.  She has not had any recent rashes.  She has occasional discomfort in the left [facility] joint but denies any other joint pain or inflammation currently.  She has occasional symptoms of right-sided sciatica.  She has been seeing a massage therapist more frequently which has been alleviating her symptoms.  She requested a refill of methocarbamol which has alleviated her symptoms in the past.  She has an upcoming appointment with her PCP next week and will be further addressing her chronic anemia at that time.  Activities of Daily Living:   Patient reports morning stiffness for 5 minutes.   Patient Reports nocturnal pain.  Difficulty dressing/grooming: Denies Difficulty climbing stairs: Reports Difficulty getting out of chair: Reports Difficulty using hands for taps, buttons, cutlery, and/or writing: Denies  Review of Systems  Constitutional:  Positive for fatigue.  HENT:  Positive for mouth dryness. Negative for mouth sores and nose dryness.   Eyes:  Negative for pain, itching and dryness.  Respiratory:  Negative for shortness of breath and difficulty breathing.   Cardiovascular:  Negative for chest pain and palpitations.  Gastrointestinal:  Negative for blood in stool, constipation and diarrhea.  Endocrine: Negative for increased urination.  Genitourinary:  Negative for difficulty urinating.  Musculoskeletal:  Positive for joint pain, joint pain, myalgias, morning stiffness, muscle tenderness and myalgias. Negative for joint swelling.  Skin:  Positive for color change. Negative for rash and redness.  Allergic/Immunologic: Negative for susceptible to infections.  Neurological:  Positive for numbness. Negative for dizziness, headaches, memory loss and weakness.  Hematological:  Positive for bruising/bleeding tendency.  Psychiatric/Behavioral:  Negative for confusion.    PMFS History:  Patient Active Problem List   Diagnosis Date Noted   High risk medication use 02/18/2019   Therapeutic drug monitoring 02/18/2019   Healthcare maintenance 02/18/2019   ILD (interstitial lung disease) ([place]) 11/28/2016   Multiple lung nodules on CT 11/28/2016   Abnormal laboratory test 11/18/2016  DDD L spine 11/18/2016   Scl-70 antibody positive 11/10/2016   History of hypothyroidism 11/10/2016   History of hemochromatosis 11/10/2016   Pulmonary emphysema ([place]) 08/31/2016   [name] phenomenon without gangrene 08/31/2016   Sinusitis, chronic 08/31/2016   Chronic cough 02/10/2016   Irritable larynx 02/10/2016   Stopped smoking  with greater than 40 pack year history 02/10/2016   Chest pain, unspecified 10/06/2010   Shortness of breath 10/06/2010   Hypothyroidism    Hereditary hemochromatosis ([place]) 04/22/2010   ATRIAL FIBRILLATION 10/20/2008   PAC 10/20/2008   SYNCOPE AND COLLAPSE 10/20/2008    Past Medical History:  Diagnosis Date   Atrial fibrillation ([place])    a. Flecainide therapy;  b. event monitor 4/12   Chest pain    a. GXT myoview 4/12: no isch., EF 86%;   b. 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 Beryle Beams   History of syncope    Hypothyroidism     Family History  Problem Relation Age of Onset   Stroke Mother    Heart disease Father 8   Hemochromatosis Brother    Breast cancer Neg Hx    Past Surgical History:  Procedure Laterality Date   MOUTH SURGERY     SHOULDER ARTHROSCOPY Left    TOTAL ABDOMINAL HYSTERECTOMY     VARICOSE VEIN SURGERY     Social History   Social History Narrative   REGULAR EXERCISE   Immunization History  Administered Date(s) Administered   Fluad Quad(high Dose 65+) 02/18/2019   Influenza Split 03/27/2019   Influenza, High Dose Seasonal PF 08/29/2017, 04/26/2018, 04/29/2020, 05/12/2021   PFIZER(Purple Top)SARS-COV-2 Vaccination 07/22/2019, 08/12/2019, 03/11/2020   Pneumococcal Conjugate-13 06/12/2014   Pneumococcal Polysaccharide-23 06/27/2010, 04/25/2018   Tdap 03/16/2011, 07/05/2019   Zoster Recombinat (Shingrix) 04/08/2019   Zoster, Live 06/27/2010, 04/08/2019     Objective: Vital Signs: BP 122/76 (BP Location: Left Arm, Patient Position: Sitting, Cuff Size: Normal)    Pulse (!) 55    Ht 5' 3.5" (1.613 m)    Wt 144 lb 9.6 oz (65.6 kg)    BMI 25.21 kg/m    Physical Exam Vitals and nursing note reviewed.  Constitutional:      Appearance: She is well-developed.  HENT:     Head: Normocephalic and atraumatic.   Eyes:     Conjunctiva/sclera: Conjunctivae normal.  Cardiovascular:     Rate and Rhythm: Normal rate and regular rhythm.     Heart sounds: Normal heart sounds.  Pulmonary:     Effort: Pulmonary effort is normal.     Breath sounds: Normal breath sounds.  Abdominal:     General: Bowel sounds are normal.     Palpations: Abdomen is soft.  Musculoskeletal:     Cervical back: Normal range of motion.  Skin:    General: Skin is warm and dry.     Capillary Refill: Capillary refill takes 2 to 3 seconds.     Comments: No signs of sclerodactyly were noted. Telangectasias noted over both hands.  Some nailbed capillary changes were noted.  She has slightly delayed capillary refill 2 to 3 seconds.  No digital ulcerations or signs of gangrene were noted.  Neurological:     Mental Status: She is alert and oriented to person, place, and time.  Psychiatric:        Behavior: Behavior normal.  Musculoskeletal Exam: C-spine is limited range of motion without rotation.  Shoulder joints, elbow joints, wrist joints, MCPs, PIPs, DIPs have good range of motion with no synovitis.  She has some PIP and DIP thickening consistent with osteoarthritis of both hands.  Tenderness over the left [facility] joint noted.  Complete fist formation bilaterally.  Hip joints, knee joints, and ankle joints have good range of motion with no discomfort.  No warmth or effusion of knee joints noted.  No tenderness or swelling of ankle joints.  No tenderness over MTP joints.  No tenderness over the trochanteric bursa bilaterally.  CDAI Exam: CDAI Score: -- Patient Global: --; Provider Global: -- Swollen: --; Tender: -- Joint Exam 08/13/2021   No joint exam has been documented for this visit   There is currently no information documented on the homunculus. Go to the Rheumatology activity and complete the homunculus joint exam.  Investigation: No additional findings.  Imaging: No results found.  Recent Labs: Lab Results   Component Value Date   WBC 5.8 07/23/2021   HGB 10.8 (L) 07/23/2021   PLT 215.0 07/23/2021   NA 138 07/23/2021   K 4.1 07/23/2021   CL 102 07/23/2021   CO2 31 07/23/2021   GLUCOSE 84 07/23/2021   BUN 17 07/23/2021   CREATININE 0.81 07/23/2021   BILITOT 0.4 07/23/2021   ALKPHOS 43 07/23/2021   AST 13 07/23/2021   ALT 9 07/23/2021   PROT 6.4 07/23/2021   ALBUMIN 4.1 07/23/2021   CALCIUM 9.3 07/23/2021   GFRAA 101 06/09/2020   QFTBGOLDPLUS NEGATIVE 08/10/2017    Speciality Comments: No specialty comments available.  Procedures:  No procedures performed Allergies: Patient has no known allergies.         Assessment / Plan:     Visit Diagnoses: Scleroderma ([place]) - Nailbed capillary changes, anticardiolipin antibody+, Scl-70+, ILD: She remains on Myfortic 360 mg 1 tablet by mouth twice daily, Bactrim DS on Mondays, Wednesdays, and Fridays and prednisone 5 mg 1 tablet by mouth daily prescribed by Dr. Chase Caller.  She has been experiencing more frequent and severe symptoms of [name] in her fingertips.  Last week she had an episode in the right thumb which persisted for several days.  She has not had any digital ulcerations or signs of gangrene on exam.  Her capillary refill was 2 to 3 seconds on examination today.  She has not a good candidate for amlodipine since her blood pressure is currently well controlled and there would be a concern for hypotension if amlodipine was added.  Discussed different treatment options today.  We will try nitroglycerin ointment topically as needed for symptomatic relief.  Possible side effects were reviewed with the patient today in the office.  She was advised to notify us if she cannot tolerate using the nitroglycerin ointment.  She was also advised to notify us if she develops any new or worsening symptoms.  Discussed the importance of avoiding triggers as well as keeping her core body temperature warm and using hand warmers. No signs of sclerodactyly  were noted on examination today. She has not noticed any other new or worsening symptoms in regards to scleroderma.  She has not had any dysphagia or constipation.  She has crackles at bibasilar lung bases.  She has had some increased shortness of breath and intermittent coughing since being diagnosed with COVID-19 in December 2022.  It is unclear if the slight worsening of her symptoms is due to COVID-19 or worsening ILD.  She is  scheduled for high-resolution chest CT on 08/24/2021 and a future order was placed for an echocardiogram.  She will remain on the current treatment regimen prescribed by Dr. Chase Caller.  She was advised to notify us if she develops any new or worsening symptoms.  She will follow-up in the office in 3 months.  Anticardiolipin antibody positive - Positive anticardiolipin IgM 47, positive beta-2 IgM 31.  She remains on Eliquis as prescribed.  ILD (interstitial lung disease) ([place]) - She is followed by Dr. Chase Caller.  Her symptoms have been slightly worse than baseline.  She was diagnosed with COVID-19 in December 2022 which she feels may be contributing to some of her increased shortness of breath and occasional breathlessness.   She was evaluated by Dr. Chase Caller on 07/23/21 and the office visit note was reviewed today in the office.  Last CFT of the chest was September 2021, echocardiogram January 2021, and PFTs February 2022 He recommended continuing on the current dose of Myfortic, prednisone, and bactrim as prescribed.  Planning to update HRCT, echocardiogram, and spirometry and DLCO in 1-3 months. HRCT is scheduled on 08/24/21.  High risk medication use - Myfortic 360 mg 1 tablet by mouth twice daily, Bactrim DS on Mondays, Wednesdays, and Fridays and prednisone 5 mg 1 tablet by mouth daily by Dr. Chase Caller.   CBC, BMP, hepatic function panel were updated on 07/23/2021 and results were reviewed with the patient today.  Patient has an upcoming appointment with her PCP next week for  further monitoring of chronic anemia.  Primary osteoarthritis of both hands: She has some PIP and DIP thickening consistent with osteoarthritis of both hands.  Tenderness over the left [facility] joint was noted.  Discussed the importance of joint protection and muscle strengthening.   Trochanteric bursitis of both hips: She has some tenderness over both trochanteric bursa, right greater than left.  Discussed the importance of performing stretching exercises daily.  Her discomfort has been improving since seeing her massage therapist twice this week.   Age-related osteoporosis without current pathological fracture - DEXA scan from Nov 04, 2019 T score -2.9 left femoral neck, BMD 0.532.  DEXA will be due in May 2023.  A future order will be placed.  No recent falls or fractures.  She remains on a vitamin D supplement.   Long term systemic steroid user - She remains on prednisone 5 mg 1 tablet by mouth daily. She is aware of the risks of long term prednisone use.  Future order for DXA was placed otdya.   Synovial cyst of left popliteal space: Unchanged.  No tenderness upon palpation.  DDD (degenerative disc disease), lumbar - She has seen Dr. Ninfa Linden in the past.  She experiences intermittent discomfort in her lower back as well as intermittent symptoms of right-sided sciatica.  She is having some symptoms of right-sided sciatica at this time.  Her symptoms have gradually been improving over the past 1 week.  She has seen a massage therapist twice this week.  She requested a refill of methocarbamol which has helped to alleviate her discomfort in the past.  A refill was sent to the pharmacy today.  Other medical conditions are listed as follows:  History of atrial fibrillation  Multiple lung nodules on CT - Most recent HRCT was on 03/16/20.  History of emphysema  History of hypothyroidism  History of hemochromatosis: Followed by PCP.  She has not required a phlebotomy in several years.  Vitamin D  deficiency  Former smoker  Orders: Orders  Placed This Encounter  Procedures   DG BONE DENSITY (DXA)   Meds ordered this encounter  Medications   methocarbamol (ROBAXIN) 500 MG tablet    Sig: Take 1 tablet (500 mg total) by mouth daily as needed for muscle spasms.    Dispense:  30 tablet    Refill:  2   nitroGLYCERIN (NITROGLYN) 2 % ointment    Sig: Apply 1/4 inch to fingertips three times daily as needed.    Dispense:  30 g    Refill:  1      Follow-Up Instructions: Return in about 3 months (around 11/10/2021) for Scleroderma, ILD.   Ofilia Neas, PA-C  Note - This record has been created using Dragon software.  Chart creation errors have been sought, but may not always  have been located. Such creation errors do not reflect on  the standard of medical care.

## 2021-08-13 ENCOUNTER — Other Ambulatory Visit: Payer: Self-pay

## 2021-08-13 ENCOUNTER — Ambulatory Visit: Payer: Medicare Other | Admitting: Physician Assistant

## 2021-08-13 ENCOUNTER — Encounter: Payer: Self-pay | Admitting: Physician Assistant

## 2021-08-13 VITALS — BP 122/76 | HR 55 | Ht 63.5 in | Wt 144.6 lb

## 2021-08-13 DIAGNOSIS — Z8639 Personal history of other endocrine, nutritional and metabolic disease: Secondary | ICD-10-CM

## 2021-08-13 DIAGNOSIS — M5136 Other intervertebral disc degeneration, lumbar region: Secondary | ICD-10-CM

## 2021-08-13 DIAGNOSIS — R760 Raised antibody titer: Secondary | ICD-10-CM | POA: Diagnosis not present

## 2021-08-13 DIAGNOSIS — E559 Vitamin D deficiency, unspecified: Secondary | ICD-10-CM

## 2021-08-13 DIAGNOSIS — Z79899 Other long term (current) drug therapy: Secondary | ICD-10-CM

## 2021-08-13 DIAGNOSIS — Z87891 Personal history of nicotine dependence: Secondary | ICD-10-CM

## 2021-08-13 DIAGNOSIS — M19041 Primary osteoarthritis, right hand: Secondary | ICD-10-CM

## 2021-08-13 DIAGNOSIS — J849 Interstitial pulmonary disease, unspecified: Secondary | ICD-10-CM | POA: Diagnosis not present

## 2021-08-13 DIAGNOSIS — Z8709 Personal history of other diseases of the respiratory system: Secondary | ICD-10-CM

## 2021-08-13 DIAGNOSIS — M7062 Trochanteric bursitis, left hip: Secondary | ICD-10-CM

## 2021-08-13 DIAGNOSIS — M349 Systemic sclerosis, unspecified: Secondary | ICD-10-CM

## 2021-08-13 DIAGNOSIS — M19042 Primary osteoarthritis, left hand: Secondary | ICD-10-CM

## 2021-08-13 DIAGNOSIS — M7122 Synovial cyst of popliteal space [Baker], left knee: Secondary | ICD-10-CM

## 2021-08-13 DIAGNOSIS — R918 Other nonspecific abnormal finding of lung field: Secondary | ICD-10-CM

## 2021-08-13 DIAGNOSIS — Z8679 Personal history of other diseases of the circulatory system: Secondary | ICD-10-CM

## 2021-08-13 DIAGNOSIS — M7061 Trochanteric bursitis, right hip: Secondary | ICD-10-CM

## 2021-08-13 DIAGNOSIS — Z7952 Long term (current) use of systemic steroids: Secondary | ICD-10-CM

## 2021-08-13 DIAGNOSIS — M810 Age-related osteoporosis without current pathological fracture: Secondary | ICD-10-CM

## 2021-08-13 MED ORDER — NITROGLYCERIN 2 % TD OINT: TOPICAL_OINTMENT | TRANSDERMAL | 1 refills | Status: DC

## 2021-08-13 MED ORDER — METHOCARBAMOL 500 MG PO TABS: 500.0000 mg | ORAL_TABLET | Freq: Every day | ORAL | 2 refills | Status: DC | PRN

## 2021-08-13 NOTE — Patient Instructions (Signed)
[name] Phenomenon [name] phenomenon is a condition that affects the blood vessels (arteries) that carry blood to the fingers and toes. The arteries that supply blood to the ears, lips, nipples, or the tip of the nose might also be affected. [name] phenomenon causes the arteries to become narrow temporarily (spasm). As a result, the flow of blood to the affected areas is temporarily decreased. This usually occurs in response to cold temperatures or stress. During an attack, the skin in the affected areas turns white, then blue, and finally red. A person may also feel tingling or numbness in those areas. Attacks usually last for only a brief period, and then the blood flow to the area returns to normal. In most cases, [name] phenomenon does not cause serious health problems. What are the causes? In many cases, the cause of this condition is not known. The condition may occur on its own (primary [name] phenomenon) or may be associated with other diseases or factors (secondary [name] phenomenon). Possible causes may include: Diseases or medical conditions that damage the arteries. Injuries and repetitive actions that hurt the hands or feet. Being exposed to certain chemicals. Taking medicines that narrow the arteries. Other medical conditions, such as lupus, scleroderma, rheumatoid arthritis, thyroid problems, blood disorders, Sjogren syndrome, or atherosclerosis. What increases the risk? The following factors may make you more likely to develop this condition: Being 72-47 years old. Being female. Having a family history of [name] phenomenon. Living in a cold climate. Smoking. What are the signs or symptoms? Symptoms of this condition usually occur when you are exposed to cold temperatures or when you have emotional stress. The symptoms may last for a few minutes or up to several hours. They usually affect your fingers but may also affect your toes, nipples, lips, ears, or the tip  of your nose. Symptoms may include: Changes in skin color. The skin in the affected areas will turn pale or white. The skin may then change from white to bluish to red as normal blood flow returns to the area. Numbness, tingling, or pain in the affected areas. In severe cases, symptoms may include: Skin sores. Tissues decaying and dying (gangrene). How is this diagnosed? This condition may be diagnosed based on: Your symptoms and medical history. A physical exam. During the exam, you may be asked to put your hands in cold water to check for a reaction to cold temperature. Tests, such as: Blood tests to check for other diseases or conditions. A test to check the movement of blood through your arteries and veins (vascular ultrasound). A test in which the skin at the base of your fingernail is examined under a microscope (nailfold capillaroscopy). How is this treated? During an episode, you can take actions to help symptoms go away faster. Options include moving your arms around in a windmill pattern, warming your fingers under warm water, or placing your fingers in a warm body fold, such as your armpit. Long-term treatment for this condition often involves making lifestyle changes and taking steps to control your exposure to cold temperature. For more severe cases, medicine (calcium channel blockers) may be used to improve blood circulation. Follow these instructions at home: Avoiding cold temperatures Take these steps to avoid exposure to cold: If possible, stay indoors during cold weather. When you go outside during cold weather, dress in layers and wear mittens, a hat, a scarf, and warm footwear. Wear mittens or gloves when handling ice or frozen food. Use holders for glasses or cans containing cold  drinks. Let warm water run for a while before taking a shower or bath. Warm up the car before driving in cold weather. Lifestyle If possible, avoid stressful and emotional situations. Try to  find ways to manage your stress, such as: Exercise. Yoga. Meditation. Biofeedback. Do not use any products that contain nicotine or tobacco. These products include cigarettes, chewing tobacco, and vaping devices, such as e-cigarettes. If you need help quitting, ask your health care provider. Avoid secondhand smoke. Limit your use of caffeine. Switch to decaffeinated coffee, tea, and soda. Avoid chocolate. Avoid vibrating tools and machinery. General instructions Protect your hands and feet from injuries, cuts, or bruises. Avoid wearing tight rings or wristbands. Wear loose fitting socks and comfortable, roomy shoes. Take over-the-counter and prescription medicines only as told by your health care provider. Where to find support [name] Association: www.raynauds.org Where to find more information [employer] of Arthritis and Musculoskeletal and Skin Diseases: www.niams.SouthExposed.es Contact a health care provider if: Your discomfort becomes worse despite lifestyle changes. You develop sores on your fingers or toes that do not heal. You have breaks in the skin on your fingers or toes. You have a fever. You have pain or swelling in your joints. You have a rash. Your symptoms occur on only one side of your body. Get help right away if: Your fingers or toes turn black. You have severe pain in the affected areas. These symptoms may represent a serious problem that is an emergency. Do not wait to see if the symptoms will go away. Get medical help right away. Call your local emergency services (911 in the [place] Do not drive yourself to the hospital. Summary [name] phenomenon is a condition that affects the arteries that carry blood to the fingers, toes, ears, lips, nipples, or the tip of the nose. In many cases, the cause of this condition is not known. Symptoms of this condition include changes in skin color along with numbness and tingling in the affected area. Treatment for this  condition includes lifestyle changes and reducing exposure to cold temperatures. Medicines may be used for severe cases of the condition. Contact your health care provider if your condition worsens despite treatment. This information is not intended to replace advice given to you by your health care provider. Make sure you discuss any questions you have with your health care provider. Document Revised: 08/18/2020 Document Reviewed: 08/18/2020 Elsevier Patient Education  2022 [employer].

## 2021-08-16 ENCOUNTER — Other Ambulatory Visit: Payer: Self-pay

## 2021-08-16 ENCOUNTER — Ambulatory Visit (HOSPITAL_COMMUNITY): Payer: Medicare Other | Attending: Internal Medicine

## 2021-08-16 DIAGNOSIS — J849 Interstitial pulmonary disease, unspecified: Secondary | ICD-10-CM | POA: Insufficient documentation

## 2021-08-16 DIAGNOSIS — R0600 Dyspnea, unspecified: Secondary | ICD-10-CM | POA: Diagnosis not present

## 2021-08-16 DIAGNOSIS — I4891 Unspecified atrial fibrillation: Secondary | ICD-10-CM | POA: Insufficient documentation

## 2021-08-16 DIAGNOSIS — Z87891 Personal history of nicotine dependence: Secondary | ICD-10-CM | POA: Diagnosis not present

## 2021-08-16 LAB — ECHOCARDIOGRAM COMPLETE
Area-P 1/2: 3.31 cm2
S' Lateral: 2.9 cm

## 2021-08-19 ENCOUNTER — Other Ambulatory Visit: Payer: Self-pay

## 2021-08-19 ENCOUNTER — Ambulatory Visit: Payer: Medicare Other | Admitting: Orthopaedic Surgery

## 2021-08-19 ENCOUNTER — Encounter: Payer: Self-pay | Admitting: Orthopaedic Surgery

## 2021-08-19 ENCOUNTER — Telehealth: Payer: Self-pay | Admitting: *Deleted

## 2021-08-19 ENCOUNTER — Ambulatory Visit: Payer: Self-pay

## 2021-08-19 DIAGNOSIS — M25551 Pain in right hip: Secondary | ICD-10-CM

## 2021-08-19 NOTE — Progress Notes (Signed)
Office Visit Note   Patient: [name]           Date of Birth: [birth year]           MRN: [id number] Visit Date: 08/19/2021              Requested by: Kathyrn Lass, [place],  [place] [id number] PCP: Kathyrn Lass, MD   Assessment & Plan: Visit Diagnoses:  1. Pain in right hip     Plan: Impression is chronic right hip pain with radiation to the lower leg.  It appears that her symptoms could actually be coming from her trochanteric bursa versus hip joint versus back.  Because she is so tender to the lateral hip, I would like to try diagnostic and hopefully therapeutic cortisone injection into the bursa.  I have also provided her with an IT band exercise program.  We have gone ahead and made a referral to Dr. Ernestina Patches for diagnostic and therapeutic cortisone injection to the right hip joint.  If neither of these injections help, she will follow-up with [name] we will look further into her back.  Follow-Up Instructions: Return if symptoms worsen or fail to improve.   Orders:  Orders Placed This Encounter  Procedures   Large Joint Inj: R greater trochanter   XR HIP UNILAT W OR W/O PELVIS 2-3 VIEWS RIGHT   No orders of the defined types were placed in this encounter.     Procedures: No procedures performed   Clinical Data: No additional findings.   Subjective: Chief Complaint  Patient presents with   Right Hip - Pain    HPI patient is a pleasant 81 year old female who comes in today with right groin, anterior lateral hip and thigh pain radiating to her foot.  She has had this on and off for several years but is recently worsened.  The pain appears to be worse with hip flexio, stairs as well as taking her first few steps after being in a seated position.  She has been taking Robaxin and NSAIDs without relief.  She denies any paresthesias to right lower extremity.  She has tried therapy in addition to massage in the past without relief.  She does have a  history of sciatica for which she is seeing Dr. Ninfa Linden for.  Her usual course of medication which helps these symptoms has not helped at this time.  She thinks she has had a hip injection with Dr. Ernestina Patches but I am unable to find any notes from this.  Review of Systems as detailed in HPI.  All others reviewed and are negative.   Objective: Vital Signs: There were no vitals taken for this visit.  Physical Exam well-developed well-nourished female no acute distress.  Alert and oriented x3.  Ortho Exam right hip exam shows a negative logroll.  She does have minimal pain to the groin with FADIR test.  Negative straight leg raise.  Marked tenderness to the greater trochanter.  She is neurovascular intact distally.  Specialty Comments:  No specialty comments available.  Imaging: XR HIP UNILAT W OR W/O PELVIS 2-3 VIEWS RIGHT  Result Date: 08/19/2021 Mild degenerative changes to the right hip joint    PMFS History: Patient Active Problem List   Diagnosis Date Noted   High risk medication use 02/18/2019   Therapeutic drug monitoring 02/18/2019   Healthcare maintenance 02/18/2019   ILD (interstitial lung disease) ([facility]) 11/28/2016   Multiple lung nodules on CT 11/28/2016   Abnormal  laboratory test 11/18/2016   DDD L spine 11/18/2016   Scl-70 antibody positive 11/10/2016   History of hypothyroidism 11/10/2016   History of hemochromatosis 11/10/2016   Pulmonary emphysema ([facility]) 08/31/2016   [name] phenomenon without gangrene 08/31/2016   Sinusitis, chronic 08/31/2016   Chronic cough 02/10/2016   Irritable larynx 02/10/2016   Stopped smoking with greater than 40 pack year history 02/10/2016   Chest pain, unspecified 10/06/2010   Shortness of breath 10/06/2010   Hypothyroidism    Hereditary hemochromatosis ([facility]) 04/22/2010   ATRIAL FIBRILLATION 10/20/2008   PAC 10/20/2008   SYNCOPE AND COLLAPSE 10/20/2008   Past Medical History:  Diagnosis Date   Atrial fibrillation ([facility])    a.  Flecainide therapy;  b. event monitor 4/12   Chest pain    a. GXT myoview 4/12: no isch., EF 86%;   b. 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 Beryle Beams   History of syncope    Hypothyroidism     Family History  Problem Relation Age of Onset   Stroke Mother    Heart disease Father 78   Hemochromatosis Brother    Breast cancer Neg Hx     Past Surgical History:  Procedure Laterality Date   MOUTH SURGERY     SHOULDER ARTHROSCOPY Left    TOTAL ABDOMINAL HYSTERECTOMY     VARICOSE VEIN SURGERY     Social History   Occupational History   Occupation: RETIRED    Employer: RETIRED  Tobacco Use   Smoking status: Former    Packs/day: 1.00    Years: 42.00    Pack years: 42.00    Types: Cigarettes    Quit date: 06/27/1997    Years since quitting: 24.1   Smokeless tobacco: Never  Vaping Use   Vaping Use: Never used  Substance and Sexual Activity   Alcohol use: Yes    Comment: occ   Drug use: No   Sexual activity: Not on file

## 2021-08-19 NOTE — Telephone Encounter (Signed)
Received a potential clinical concern.   Potential Clinical Concern: Methocarbamol >= 80 year old  Possible Risks: risk of sedation, increased risk of fracture and have questionable efficacy in patients 27 years of age and older.   Reviewed by: Hazel Sams, PA-C  Recommendations: Please encourage patient to take Robaxin PRN only.   Spoke with patient and advised of risks and to take Robaxin PRN only. Patient states she only uses it as needed.

## 2021-08-24 ENCOUNTER — Ambulatory Visit
Admission: RE | Admit: 2021-08-24 | Discharge: 2021-08-24 | Disposition: A | Discharge status: Home / Self Care | Payer: Medicare Other | Source: Ambulatory Visit | Attending: Internal Medicine | Admitting: Internal Medicine

## 2021-08-24 DIAGNOSIS — J849 Interstitial pulmonary disease, unspecified: Secondary | ICD-10-CM

## 2021-08-30 ENCOUNTER — Ambulatory Visit: Payer: Medicare Other | Admitting: Orthopaedic Surgery

## 2021-09-07 ENCOUNTER — Ambulatory Visit: Payer: Medicare Other | Admitting: Physical Medicine and Rehabilitation

## 2021-09-07 ENCOUNTER — Encounter: Payer: Self-pay | Admitting: Physical Medicine and Rehabilitation

## 2021-09-07 ENCOUNTER — Ambulatory Visit: Payer: Self-pay

## 2021-09-07 DIAGNOSIS — M25551 Pain in right hip: Secondary | ICD-10-CM | POA: Diagnosis not present

## 2021-09-07 NOTE — Progress Notes (Signed)
? ?  [name] - 81 y.o. female MRN [id number]  Date of birth: [birth year] ? ?Office Visit Note: ?Visit Date: 09/07/2021 ?PCP: Kathyrn Lass, MD ?Referred by: Kathyrn Lass, MD ? ?Subjective: ?Chief Complaint  ?Patient presents with  ? Right Hip - Pain  ? ?HPI:  [name] is a 81 y.o. female who comes in today at the request of Dr. Eduard Roux for planned Right anesthetic hip arthrogram with fluoroscopic guidance.  The patient has failed conservative care including home exercise, medications, time and activity modification.  This injection will be diagnostic and hopefully therapeutic.  Please see requesting physician notes for further details and justification. ? ?ROS Otherwise per HPI. ? ?Assessment & Plan: ?Visit Diagnoses:  ?  ICD-10-CM   ?1. Pain in right hip  M25.551 Large Joint Inj: R hip joint  ?  XR C-ARM NO REPORT  ?  ?  ?Plan: No additional findings.  ? ?Meds & Orders: No orders of the defined types were placed in this encounter. ?  ?Orders Placed This Encounter  ?Procedures  ? Large Joint Inj: R hip joint  ? XR C-ARM NO REPORT  ?  ?Follow-up: Return for visit to requesting provider as needed.  ? ?Procedures: ?Large Joint Inj: R hip joint on 09/07/2021 3:12 PM ?Indications: diagnostic evaluation and pain ?Details: 22 G 3.5 in needle, fluoroscopy-guided anterior approach ? ?Arthrogram: No ? ?Medications: 4 mL bupivacaine 0.25 %; 60 mg triamcinolone acetonide 40 MG/ML ?Outcome: tolerated well, no immediate complications ? ?There was excellent flow of contrast producing a partial arthrogram of the hip. The patient did have relief of symptoms during the anesthetic phase of the injection. ?Procedure, treatment alternatives, risks and benefits explained, specific risks discussed. Consent was given by the patient. Immediately prior to procedure a time out was called to verify the correct patient, procedure, equipment, support staff and site/side marked as required. Patient was prepped and draped in the  usual sterile fashion.  ? ?  ?   ? ?Clinical History: ?No specialty comments available.  ? ? ? ?Objective:  VS:  HT:    WT:   BMI:     BP:   HR: bpm  TEMP: ( )  RESP:  ?Physical Exam  ? ?Imaging: ?No results found. ?

## 2021-09-07 NOTE — Progress Notes (Signed)
Pt state right hip pain. Pt state walking and standing makes the pain worse. Pt state she takes over the counter pain meds to help ease her pain. ? ?Numeric Pain Rating Scale and Functional Assessment ?Average Pain 2 ? ? ?In the last MONTH (on 0-10 scale) has pain interfered with the following? ? ?1. General activity like being  able to carry out your everyday physical activities such as walking, climbing stairs, carrying groceries, or moving a chair?  ?Rating(9) ? ? ?+BT, -Dye Allergies. ? ?

## 2021-09-09 ENCOUNTER — Encounter: Payer: Self-pay | Admitting: Internal Medicine

## 2021-09-09 ENCOUNTER — Ambulatory Visit (INDEPENDENT_AMBULATORY_CARE_PROVIDER_SITE_OTHER): Payer: Medicare Other | Admitting: Internal Medicine

## 2021-09-09 ENCOUNTER — Other Ambulatory Visit: Payer: Self-pay

## 2021-09-09 ENCOUNTER — Ambulatory Visit: Payer: Medicare Other | Admitting: Internal Medicine

## 2021-09-09 VITALS — BP 130/74 | HR 56 | Temp 97.8°F | Ht 63.75 in | Wt 142.2 lb

## 2021-09-09 DIAGNOSIS — J849 Interstitial pulmonary disease, unspecified: Secondary | ICD-10-CM

## 2021-09-09 LAB — PULMONARY FUNCTION TEST
DL/VA % pred: 70 %
DL/VA: 2.86 ml/min/mmHg/L
DLCO cor % pred: 66 %
DLCO cor: 12.46 ml/min/mmHg
DLCO unc % pred: 60 %
DLCO unc: 11.33 ml/min/mmHg
FEF 25-75 Pre: 0.97 L/sec
FEF2575-%Pred-Pre: 69 %
FEV1-%Pred-Pre: 94 %
FEV1-Pre: 1.81 L
FEV1FVC-%Pred-Pre: 91 %
FEV6-%Pred-Pre: 107 %
FEV6-Pre: 2.61 L
FEV6FVC-%Pred-Pre: 104 %
FVC-%Pred-Pre: 103 %
FVC-Pre: 2.67 L
Pre FEV1/FVC ratio: 68 %
Pre FEV6/FVC Ratio: 99 %

## 2021-09-09 NOTE — Patient Instructions (Addendum)
ICD-10-CM   ?1. ILD (interstitial lung disease) ([facility])  J84.9   ?2. High risk medication use  Z79.899   ?3. Therapeutic drug monitoring  Z51.81   ?4. Scl-70 antibody positive  R76.8   ?5. [name] phenomenon without gangrene  I73.00   ?6. Centrilobular emphysema ([facility])  J43.2   ? ? ? ? ?Interstitial pulmonary disease ([facility]) ?Scl-70 antibody positive ?[name] phenomenon without gangrene ?High risk medication use ?Therapeutic drug monitoring ? ?Symptoms stable ? ? - Last CT scan of the chest in September 2021 -> March 2023 without progression ?- Echo feb 2023 - no pulmonary hypertensio ?- PFT March 2023 - 3% decline in annual dlco that is minimal ? ?Plan ? - cotninue  prednisone to 5 mg per day +  myfortic and bactrim at current dose ?- we discussed ofev again but took shared decision making against it ?- take book THursdays with Cornelia Copa ? - appreciate future interest in clinical trials ?- do spirometry and dlco in 6 months ? ?Pulmonary emphysema, unspecified emphysema type ([facility]) ?-  Continue spiriva ? ?Chronic cough ?Irritable Larynx ?History of seasonal allergies ? ?-Based on history I suspect chronic cough is due to cough neuropathy otherwise call irritable larynx.  But he has seasonal allergies playing a role in this is open question. Other reasons  interstitial lung disease [as above and stable] the underlying emphysema [stable] ? ?- Glad currently very mild and improved ? ?Plan ?-expectant followup ? ? ?Followup ?-6 months with Dr Chase Caller - 30 min visit but after spiro/dlco ? - symptom score at followup ?

## 2021-09-09 NOTE — Progress Notes (Signed)
? ? ? ? ?PCP [name],[name], PA-C ? ? ?HPI ? ?IOV 02/10/2016 ? ?Chief Complaint  ?Patient presents with  ? Pulmonary Consult  ?  Pt referred by Dr. Melford Aase for chronic cough x 1 year. Pt states she feels she has a tickle that is causing her dry cough. Pt states she has DOE when climbing stairs. Pt deneis CP/tightness.   ? ? ?81 year old female with hemochromatosis homozygous gene followed by Dr. Beryle Beams (last phlebotomy many years ago and is on serial monitoring) with children and siblings with active disease. She has also atrial fibrillation followed by Dr. Caryl Comes. Reports insidious onset of chronic cough in the last 1 year. It is stable since onset. It fluctuates between mild and severe in severity. It is mostly dry in quality. Mostly present in the daytime but sometimes also wakes her up at night. It is definitely not progressive. It is episodic and present every day. Aggravated by talking sometimes and then the throat feels dry associated with tickle in the throat and also sensation the cough is coming from the upper chest retrosternally and sometimes relieved by drinking water or chewing on a lozenge. Also aggravated by seasonal changes particularly in the spring and the fall which makes her think she has allergies. There is sometimes associated gag. She also reports nonspecific occasional wheezing and associated shortness of breath that is nonspecific but present with exertion and relieved by rest No other clear cut aggravating or relieving factors.  ? ?cough associated history  ?- Medications: She is not on fish oil or ACE inhibitors ?- Sinus drainage: She does admit to spring allergies. She did have something removed from her hard palate several years ago by ENT does not know details ?- Acid reflux: She says she is significant acid reflux and is on Prilosec. Without which she'll have significant symptoms ?- Pulmonary disease: FenO 12 02/10/2016  and normal. She denies any personal history of asthma or  pulmonary fibrosis of COPD or emphysema smoked one pack per day started smoking at age 3 and quit in 1999. Making a 42 pack smoking history. Chest x-ray 08/26/2014 personally visualized is clear ?- Tobacco :  reports that she quit smoking about 18 years ago. She has a 42.00 pack-year smoking history. She has never used smokeless tobacco. ? ? ? ? ? ? ?OV 02/18/2016 ? ?Chief Complaint  ?Patient presents with  ? Follow-up  ?  Pt here after PFT and HRCT. Pt denies changes in SOB and cough. Pt denies any new complaints at this time.   ? ?Follow-up chronic cough. This visit is to follow-up on test results of function test and high resolution CT chest which are described below9 results show isolated reduction in diffusion capacity which can be explained by possible early ILD and also emphysema. There are also new findings of nodule 6 mm bilaterally on lower lobes. She prefers a very cautious approach to this evaluation. ? ? ? ? ?Pulmonary function test 02/12/2016 ?- FVC 2.8 cm/101%, FEV1 1.9 L/91%. Ratio 60/90%. Total lung capacity 115%. DLCO reduced at 14.5/60%. She is isolated reduction in diffusion capacity ? ? ? ?HRCT chest 02/16/16 ?IMPRESSION: ?1. Mild patchy subpleural reticulation in both lungs with a basilar ?predominance. No significant traction bronchiectasis. No frank ?honeycombing. Findings could represent an interstitial lung disease ?such as nonspecific interstitial pneumonia (NSIP), with early usual ?interstitial pneumonia (UIP) not excluded. A follow-up ?high-resolution chest CT study in 12 months is recommended to assess ?temporal pattern stability. ?2. Bilateral lower lobe  solid pulmonary nodules, largest 6 mm. ?Non-contrast chest CT at 3-6 months is recommended. If the nodules ?are stable at time of repeat CT, then future CT at 18-24 months ?(from today's scan) is considered optional for low-risk patients, ?but is recommended for high-risk patients. This recommendation ?follows the consensus statement:  Guidelines for Management of ?Incidental Pulmonary Nodules Detected on CT Images:From the ?Fleischner Society 2017; published online before print ?(10.1148/radiol.[id number]). ?3. Additional findings include mild-to-moderate centrilobular ?emphysema, aortic atherosclerosis and 2 vessel coronary ?atherosclerosis. ?  ?  ?Electronically Signed ?  By: Ilona Sorrel M.D. ?  On: 02/16/2016 14:14 ?  ? ?OV 08/31/2016 ? ?Chief Complaint  ?Patient presents with  ? Follow-up  ?  HRCT was never scheduled. Pt states that the cough is still present >> slightly improved since last OV. Pt states that she feels her allergies has gotten it ramped up again.   ? ?Follow-up multifactorial cough associated with autoimmune antibody positive, interstitial lung disease and emphysema previous history of smoking ? ?After last visit in August 2017 she had high resolution CT chest in December 2017 that showed persistence of ILD. I personally visualized the CT chest. He comes for follow-up. She tells me that since August 2017 she has insidious onset of shortness of breath and is progressively worse. It is mild and present only on inclines is not therefore tenderness. In terms of her cough is persistent. It is associated with significant sinus drainage that she thinks is allergy related. She constantly clears her throat. Lab review shows autoimmune antibody positive in August 2017 with scleroderma antibody slightly elevated at 6.4. I referred her to rheumatology but she does not remember this and she did not make this follow-up. She also tells me that she definitely has a long history of [name] phenomena in her fingers but no one has ever formally diagnosed with connective tissu ? ?e disease. ? ?OV 11/28/2016 ? ?Chief Complaint  ?Patient presents with  ? Follow-up  ?  Pt here after PFT. Pt states her breathing is uchanged since last OV. Pt c/o dry cough.- pt states this has slightly improved since last OV. Pt denies CP/tightness and f/c/s.   ? ?  follow-up cough setting of previous 42 ppd smoking with autoimmune antibody positive SCL-70, interstitial lung disease and emphysema ? ? At last visit in March 2018 I referred her to rheumatology. Since then she has seen rheumatologis Dr. Keturah Barre. I reviewd the notes and also discussed with the patient wa a good understading of what s going on. She tells me that some of the lupus antibodies have been positive correlating with Raynaud. Patient is on beta blocker for atrial fbrillation and it is the preference by the rheumatologist Dr. D that she switc to calcium channel blocker. She has a follow-up appointment pending with her electrophysiologist Dr. Caryl Comes.  She is scleroderma antibody positive but according to her rheumtolgist she does not have dermatologic  Manifestation of the disese. 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 She pulm  function test today and this is stable. In terms of arrest or symptoms she stable. The cough is actually improved. But she does get dyspneic walking up stairs especially a few flights. She does not wnt her to pulmonary habilitation because se exrcises on the treadmill although she does not monitor her saturations. ? ? reports that she quit smoking about 19 years ago. She has a 42.00 pack-year smoking history. she has never used smokeless tobacco. ? ? ?OV 06/12/2017 ? ?  Chief Complaint  ?Patient presents with  ? Follow-up  ?  Pt states that she has been doing good since last visit. States that she has a "tickle cough" that is sporadic and has SOB that she states is when she climbs 2 flights of stairs. Denies any CP.  ? ? ?Follow-up combined emphysema was interstitial lung disease [autoimmune undifferentiated connective tissue disease interstitial lung disease]. Autoimmune features from May 2018 rheumatology noted with Dr. Keturah Barre: Raynaud phenomenon positive without gangrene, positive anti-cardiolipin and positive IgM and SCL-70 positive without clinical features of  scleroderma ? ? ? ?Last seen June 2018. Since then she's stable. Overall she tells me that only problem is a tickle in her throat and slight cough. This is up and down depending on the pollen exposure she get

## 2021-09-09 NOTE — Progress Notes (Signed)
Spirometry and Dlco done today. 

## 2021-09-11 MED ORDER — BUPIVACAINE HCL 0.25 % IJ SOLN
4.0000 mL | INTRAMUSCULAR | Status: AC | PRN
  Administered 2021-09-07: 4 mL via INTRA_ARTICULAR

## 2021-09-11 MED ORDER — TRIAMCINOLONE ACETONIDE 40 MG/ML IJ SUSP
60.0000 mg | INTRAMUSCULAR | Status: AC | PRN
  Administered 2021-09-07: 60 mg via INTRA_ARTICULAR

## 2021-10-04 ENCOUNTER — Other Ambulatory Visit: Payer: Self-pay | Admitting: *Deleted

## 2021-10-04 MED ORDER — FLECAINIDE ACETATE 100 MG PO TABS: 100.0000 mg | ORAL_TABLET | Freq: Two times a day (BID) | ORAL | 0 refills | Status: DC

## 2021-10-05 ENCOUNTER — Ambulatory Visit (INDEPENDENT_AMBULATORY_CARE_PROVIDER_SITE_OTHER): Payer: Medicare Other

## 2021-10-05 ENCOUNTER — Encounter: Payer: Self-pay | Admitting: Physician Assistant

## 2021-10-05 ENCOUNTER — Other Ambulatory Visit: Payer: Self-pay

## 2021-10-05 ENCOUNTER — Ambulatory Visit: Payer: Medicare Other | Admitting: Physician Assistant

## 2021-10-05 DIAGNOSIS — M545 Low back pain, unspecified: Secondary | ICD-10-CM

## 2021-10-05 DIAGNOSIS — R102 Pelvic and perineal pain: Secondary | ICD-10-CM

## 2021-10-05 DIAGNOSIS — M25551 Pain in right hip: Secondary | ICD-10-CM

## 2021-10-05 NOTE — Progress Notes (Signed)
? ?Office Visit Note ?  ?Patient: [name]           ?Date of Birth: 15-May-1941           ?MRN: [id number] ?Visit Date: 10/05/2021 ?             ?Requested by: Kathyrn Lass, MD ?[address] ?[place],  [place] [id number] ?PCP: Kathyrn Lass, MD ? ? ?Assessment & Plan: ?Visit Diagnoses:  ?1. Acute right-sided low back pain without sciatica   ?2. Pain in right hip   ? ? ?Plan: Impression is chronic right hip and leg pain.  I believe majority of her symptoms are coming from her lumbar spine but that she may have a component coming from her hip joint.  This is been ongoing for several years now with minimal relief from conservative treatment.  I would like to go ahead and get MRIs of both her lumbar spine and pelvis to assess for structural abnormalities.  She will follow-up with [name] once these have been completed.  Call with concerns or questions. ? ?Follow-Up Instructions: Return for after MRIs.  ? ?Orders:  ?Orders Placed This Encounter  ?Procedures  ? XR Lumbar Spine 2-3 Views  ? ?No orders of the defined types were placed in this encounter. ? ? ? ? Procedures: ?No procedures performed ? ? ?Clinical Data: ?No additional findings. ? ? ?Subjective: ?Chief Complaint  ?Patient presents with  ? Right Hip - Follow-up, Pain  ? Lower Back - Pain  ? ? ?HPI patient is a pleasant 81 year old female who comes in today with chronic right hip pain and right lower leg pain.  She has been seen by [name] as well as Dr. Ninfa Linden over the past 3 years for similar issues.  She has been complaining of pain to the right lateral hip, right groin with radiation down the right leg and foot.  She denies any paresthesias.  Symptoms appear to be worse with hip flexion as well as with walking.  She is now starting to get some weakness to the right lower extremity.  She was seen in our office about a month and a half ago where trochanteric bursa injection was performed.  This seemed to dull her pain.  She was then sent to Dr. Ernestina Patches for right  hip intra-articular cortisone injection on 09/07/2021.  She notes that this injection helped the groin pain quite a bit but did not touch the pain to the lateral thigh that radiates down to the level lower leg.  She denies any previous epidural steroid injection.  No previous MRI of the lumbar spine. ? ?Review of Systems as detailed in HPI.  All others reviewed and are negative. ? ? ?Objective: ?Vital Signs: There were no vitals taken for this visit. ? ?Physical Exam well-developed well-nourished female no acute distress.  Alert and oriented x3. ? ?Ortho Exam right hip exam reveals minimal tenderness to the greater trochanter.  Negative logroll and negative FADIR.  Increased pain with hip flexion.  Negative straight leg raise.  She is neurovascular intact distally.  No focal weakness. ? ?Specialty Comments:  ?No specialty comments available. ? ?Imaging: ?XR Lumbar Spine 2-3 Views ? ?Result Date: 10/05/2021 ?Multilevel degenerative changes worse at L2-3  ? ? ?PMFS History: ?Patient Active Problem List  ? Diagnosis Date Noted  ? High risk medication use 02/18/2019  ? Therapeutic drug monitoring 02/18/2019  ? Healthcare maintenance 02/18/2019  ? ILD (interstitial lung disease) ([facility]) 11/28/2016  ? Multiple lung nodules  on CT 11/28/2016  ? Abnormal laboratory test 11/18/2016  ? DDD L spine 11/18/2016  ? Scl-70 antibody positive 11/10/2016  ? History of hypothyroidism 11/10/2016  ? History of hemochromatosis 11/10/2016  ? Pulmonary emphysema ([facility]) 08/31/2016  ? [name] phenomenon without gangrene 08/31/2016  ? Sinusitis, chronic 08/31/2016  ? Chronic cough 02/10/2016  ? Irritable larynx 02/10/2016  ? Stopped smoking with greater than 40 pack year history 02/10/2016  ? Chest pain, unspecified 10/06/2010  ? Shortness of breath 10/06/2010  ? Hypothyroidism   ? Hereditary hemochromatosis ([facility]) 04/22/2010  ? ATRIAL FIBRILLATION 10/20/2008  ? [facility] 10/20/2008  ? SYNCOPE AND COLLAPSE 10/20/2008  ? ?Past Medical History:   ?Diagnosis Date  ? Atrial fibrillation ([place])   ? a. Flecainide therapy;  b. event monitor 4/12  ? Chest pain   ? a. GXT myoview 4/12: no isch., EF 86%;   b. echo 4/12: EF 55-65%, grade 1 diast dysfxn, LAE  ? Depression with anxiety   ? GERD (gastroesophageal reflux disease)   ? Hemochromatosis   ? indentified by the C282Y gene mutation; Dr. Beryle Beams  ? History of syncope   ? Hypothyroidism   ?  ?Family History  ?Problem Relation Age of Onset  ? Stroke Mother   ? Heart disease Father 29  ? Hemochromatosis Brother   ? Breast cancer Neg Hx   ?  ?Past Surgical History:  ?Procedure Laterality Date  ? MOUTH SURGERY    ? SHOULDER ARTHROSCOPY Left   ? TOTAL ABDOMINAL HYSTERECTOMY    ? VARICOSE VEIN SURGERY    ? ?Social History  ? ?Occupational History  ? Occupation: RETIRED  ?  Employer: RETIRED  ?Tobacco Use  ? Smoking status: Former  ?  Packs/day: 1.00  ?  Years: 42.00  ?  Pack years: 42.00  ?  Types: Cigarettes  ?  Quit date: 06/27/1997  ?  Years since quitting: 24.2  ? Smokeless tobacco: Never  ?Vaping Use  ? Vaping Use: Never used  ?Substance and Sexual Activity  ? Alcohol use: Yes  ?  Comment: occ  ? Drug use: No  ? Sexual activity: Not on file  ? ? ? ? ? ? ?

## 2021-10-10 ENCOUNTER — Ambulatory Visit
Admission: RE | Admit: 2021-10-10 | Discharge: 2021-10-10 | Disposition: A | Discharge status: Home / Self Care | Payer: Medicare Other | Source: Ambulatory Visit | Attending: Physician Assistant | Admitting: Physician Assistant

## 2021-10-10 DIAGNOSIS — M545 Low back pain, unspecified: Secondary | ICD-10-CM

## 2021-10-10 DIAGNOSIS — R102 Pelvic and perineal pain: Secondary | ICD-10-CM

## 2021-10-11 NOTE — Progress Notes (Signed)
F/u to discuss

## 2021-10-11 NOTE — Progress Notes (Signed)
Called and scheduled follow up.  ?

## 2021-10-12 ENCOUNTER — Ambulatory Visit: Payer: Medicare Other | Admitting: Orthopaedic Surgery

## 2021-10-12 ENCOUNTER — Encounter: Payer: Self-pay | Admitting: Orthopaedic Surgery

## 2021-10-12 DIAGNOSIS — M545 Low back pain, unspecified: Secondary | ICD-10-CM

## 2021-10-12 DIAGNOSIS — M79604 Pain in right leg: Secondary | ICD-10-CM

## 2021-10-12 NOTE — Progress Notes (Signed)
? ?Office Visit Note ?  ?Patient: [name]           ?Date of Birth: 1941-05-20           ?MRN: [id number] ?Visit Date: 10/12/2021 ?             ?Requested by: Kathyrn Lass, MD ?[address] ?[place],  [place] [address] ?PCP: Kathyrn Lass, MD ? ? ?Assessment & Plan: ?Visit Diagnoses:  ?1. Pain in right leg   ? ? ?Plan: Impression is chronic right leg pain with evidence of facet disease worse at L4-5 based on recent lumbar spine MRI in addition to trochanteric bursitis on the right based on recent pelvis MRI.  Patient has previously undergone trochanteric bursa and intra-articular hip injections on the right without significant relief.  I would now like to refer her to Dr. Ernestina Patches for [facility] versus facet block.  She is agreeable to this plan.  Follow-up with [name] as needed. ? ?Follow-Up Instructions: Return if symptoms worsen or fail to improve.  ? ?Orders:  ?No orders of the defined types were placed in this encounter. ? ?No orders of the defined types were placed in this encounter. ? ? ? ? Procedures: ?No procedures performed ? ? ?Clinical Data: ?No additional findings. ? ? ?Subjective: ?Chief Complaint  ?Patient presents with  ? Right Hip - Pain, Follow-up  ? Lower Back - Pain, Follow-up  ? ? ?HPI patient is a pleasant 81 year old female who comes in today to discuss MRI results of the pelvis and lumbar spine.  She has been dealing with pain to the entire right lower extremity for several years.  She has previously undergone cortisone injections to the right hip joint as well as the trochanteric bursa without significant relief.  MRI of the pelvis showed tendinosis of the right gluteus in addition to trochanteric bursitis.  MRI of the lumbar spine shows multilevel facet arthropathy worse at L4-5.  No previous ESI or facet block. ? ? ? ? ?Objective: ?Vital Signs: There were no vitals taken for this visit. ? ? ? ?Ortho Exam unchanged lumbar/pelvis exam ? ?Specialty Comments:  ?No specialty comments  available. ? ?Imaging: ?No new imaging ? ? ?PMFS History: ?Patient Active Problem List  ? Diagnosis Date Noted  ? High risk medication use 02/18/2019  ? Therapeutic drug monitoring 02/18/2019  ? Healthcare maintenance 02/18/2019  ? ILD (interstitial lung disease) ([place]) 11/28/2016  ? Multiple lung nodules on CT 11/28/2016  ? Abnormal laboratory test 11/18/2016  ? DDD L spine 11/18/2016  ? Scl-70 antibody positive 11/10/2016  ? History of hypothyroidism 11/10/2016  ? History of hemochromatosis 11/10/2016  ? Pulmonary emphysema ([place]) 08/31/2016  ? [name] phenomenon without gangrene 08/31/2016  ? Sinusitis, chronic 08/31/2016  ? Chronic cough 02/10/2016  ? Irritable larynx 02/10/2016  ? Stopped smoking with greater than 40 pack year history 02/10/2016  ? Chest pain, unspecified 10/06/2010  ? Shortness of breath 10/06/2010  ? Hypothyroidism   ? Hereditary hemochromatosis ([place]) 04/22/2010  ? ATRIAL FIBRILLATION 10/20/2008  ? [facility] 10/20/2008  ? SYNCOPE AND COLLAPSE 10/20/2008  ? ?Past Medical History:  ?Diagnosis Date  ? Atrial fibrillation ([place])   ? a. Flecainide therapy;  b. event monitor 4/12  ? Chest pain   ? a. GXT myoview 4/12: no isch., EF 86%;   b. echo 4/12: EF 55-65%, grade 1 diast dysfxn, LAE  ? Depression with anxiety   ? GERD (gastroesophageal reflux disease)   ? Hemochromatosis   ?  indentified by the C282Y gene mutation; Dr. Beryle Beams  ? History of syncope   ? Hypothyroidism   ?  ?Family History  ?Problem Relation Age of Onset  ? Stroke Mother   ? Heart disease Father 81  ? Hemochromatosis Brother   ? Breast cancer Neg Hx   ?  ?Past Surgical History:  ?Procedure Laterality Date  ? MOUTH SURGERY    ? SHOULDER ARTHROSCOPY Left   ? TOTAL ABDOMINAL HYSTERECTOMY    ? VARICOSE VEIN SURGERY    ? ?Social History  ? ?Occupational History  ? Occupation: RETIRED  ?  Employer: RETIRED  ?Tobacco Use  ? Smoking status: Former  ?  Packs/day: 1.00  ?  Years: 42.00  ?  Pack years: 42.00  ?  Types: Cigarettes  ?  Quit  date: 06/27/1997  ?  Years since quitting: 24.3  ? Smokeless tobacco: Never  ?Vaping Use  ? Vaping Use: Never used  ?Substance and Sexual Activity  ? Alcohol use: Yes  ?  Comment: occ  ? Drug use: No  ? Sexual activity: Not on file  ? ? ? ? ? ? ?

## 2021-10-14 ENCOUNTER — Telehealth: Payer: Self-pay | Admitting: Physical Medicine and Rehabilitation

## 2021-10-14 NOTE — Telephone Encounter (Signed)
Patient called she is returning a call to schedule for Dr. Ernestina Patches.  ?

## 2021-10-27 ENCOUNTER — Ambulatory Visit: Payer: Medicare Other | Admitting: Physical Medicine and Rehabilitation

## 2021-10-27 ENCOUNTER — Encounter: Payer: Self-pay | Admitting: Physical Medicine and Rehabilitation

## 2021-10-27 VITALS — BP 133/77 | HR 61

## 2021-10-27 DIAGNOSIS — M7061 Trochanteric bursitis, right hip: Secondary | ICD-10-CM | POA: Diagnosis not present

## 2021-10-27 DIAGNOSIS — M5116 Intervertebral disc disorders with radiculopathy, lumbar region: Secondary | ICD-10-CM

## 2021-10-27 DIAGNOSIS — M5416 Radiculopathy, lumbar region: Secondary | ICD-10-CM | POA: Diagnosis not present

## 2021-10-27 DIAGNOSIS — M47816 Spondylosis without myelopathy or radiculopathy, lumbar region: Secondary | ICD-10-CM

## 2021-10-27 DIAGNOSIS — M25551 Pain in right hip: Secondary | ICD-10-CM

## 2021-10-27 NOTE — Progress Notes (Signed)
Pt state lower back pain that travels to her right hip, buttocks, leg and ankle. Pt state when getting up from an sitting position the pain gets worse. Pt state she takes over the counter pain meds to help ease her pain.Pt has hx of inj on 09/07/21 pt state it has helped. ? ?Numeric Pain Rating Scale and Functional Assessment ?Average Pain 10 ?Pain Right Now 8 ?My pain is constant, sharp, stabbing, tingling, and aching ?Pain is worse with: sitting, standing, and some activites ?Pain improves with: medication and injections ? ? ?In the last MONTH (on 0-10 scale) has pain interfered with the following? ? ?1. General activity like being  able to carry out your everyday physical activities such as walking, climbing stairs, carrying groceries, or moving a chair?  ?Rating(5) ? ?2. Relation with others like being able to carry out your usual social activities and roles such as  activities at home, at work and in your community. ?Rating(6) ? ?3. Enjoyment of life such that you have  been bothered by emotional problems such as feeling anxious, depressed or irritable?  ?Rating(7) ? ?

## 2021-10-27 NOTE — Progress Notes (Signed)
? ?[name] - 81 y.o. female MRN [id number]  Date of birth: 1941-02-28 ? ?Office Visit Note: ?Visit Date: 10/27/2021 ?PCP: Tara Lass, MD ?Referred by: Tara Lass, MD ? ?Subjective: ?Chief Complaint  ?Patient presents with  ? Lower Back - Pain  ? Right Leg - Pain  ? Right Ankle - Pain  ? Right Hip - Pain  ? ?HPI: [name] is a 81 y.o. female who comes in today for evaluation of chronic, worsening and severe right hip/buttock/lateral thigh and pain to right anterior thigh region radiating down to ankle. Patient reports pain has been ongoing for several years, worsened over the last couple of months. She reports pain is exacerbated by movement, activity and getting in/out of car, she describes pain as constant sore, aching and sharp sensation. Patient reports some relief of pain with stretching exercises at home, rest and use of over the counter medications as needed. Patients recent lumbar MRI exhibits multi-level facet hypertrophy, most advanced on the right at L4-L5. There is a broad based subarticular/foraminal disc protrusion more on the left at L5-S1. No high grade spinal canal stenosis noted. Patient does have history of right L4-L5 intra-articular facet injection performed in our office in 2015, reports little to no relief of pain with this procedure. No history of lumbar surgery/epidural steroid injections. Patient has history of right hip issues that is currently being managed by Dr. Eduard Roux. We did perform right intra-articular hip injection in our office in March of this year which she reports did help to relieve pain. Patient has since followed up with Dr. Erlinda Hong where she did have right greater trochanter injection with some pain relief. Patient does have history of scleroderma and is currently being managed by Dr. Bo Merino at [facility]. Patient denies focal weakness, numbness and tingling. Patient denies recent trauma or falls.  ? ? ?Review of Systems   ?Musculoskeletal:  Positive for back pain and joint pain.  ?Neurological:  Negative for tingling, sensory change, focal weakness and weakness.  ?All other systems reviewed and are negative. Otherwise per HPI. ? ?Assessment & Plan: ?Visit Diagnoses:  ?  ICD-10-CM   ?1. Lumbar radiculopathy  M54.16 Ambulatory referral to Physical Medicine Rehab  ?  ?2. Greater trochanteric bursitis of right hip  M70.61   ?  ?3. Pain in right hip  M25.551   ?  ?   ?Plan: Findings:  ?Chronic, worsening and severe right hip/buttock/lateral thigh pain and pain to right anterior thigh region radiating down to ankle. Patient continues to have severe pain despite good conservative therapies such as home exercise regimen, rest and use of medications. Patients clinical presentation and exam are complex, she does have nondermatomal pain to right lower leg, however this could be an S1 radiculopathy. Other differentials could include right greater trochanteric bursitis and mechanical right hip issue. I also feel that patient could have an underlying central sensitization syndrome such as fibromyalgia working to exacerbate her pain. We believe the next step is to perform a diagnostic and hopefully therapeutic right greater trochanter injection which we did complete today in the office without difficulty. We also believe patient could benefit from a diagnostic and hopefully therapeutic right S1 transforaminal epidural steroid injection under fluoroscopic guidance. Patient was encouraged to continue follow up with Dr. Erlinda Hong regarding right hip issues. We feel that we can get patient in quickly for lumbar epidural steroid injection. No red flag symptoms noted upon exam today.   ? ?Meds & Orders:  No orders of the defined types were placed in this encounter. ?  ?Orders Placed This Encounter  ?Procedures  ? Ambulatory referral to Physical Medicine Rehab  ?  ?Follow-up: Return for Right S1 transforaminal epidural steroid injection.  ? ?Procedures: ?Large  Joint Inj: R greater trochanter on 10/27/2021 9:00 AM ?Indications: pain and diagnostic evaluation ?Details: 22 G 3.5 in needle, lateral approach ?Medications: 40 mg triamcinolone acetonide 40 MG/ML; 5 mL bupivacaine 0.25 % ?Outcome: tolerated well, no immediate complications ?Consent was given by the patient. Immediately prior to procedure a time out was called to verify the correct patient, procedure, equipment, support staff and site/side marked as required. Patient was prepped and draped in the usual sterile fashion.  ? ?  ?   ? ?Clinical History: ?EXAM: ?MRI LUMBAR SPINE WITHOUT CONTRAST ?  ?TECHNIQUE: ?Multiplanar, multisequence MR imaging of the lumbar spine was ?performed. No intravenous contrast was administered. ?  ?COMPARISON:  MR lumbar 05/01/2015; X-ray lumbar 10/05/2021. ?  ?FINDINGS: ?Segmentation: Standard; the lowest formed disc space is designated ?L5-S1. ?  ?Alignment: There is mild levocurvature centered at L2. Trace ?anterolisthesis of L4 on L5 is unchanged. There is no other antero ?or retrolisthesis. ?  ?Vertebrae: There is mild compression deformity of the L3 vertebral ?body with up to approximately 10% loss of vertebral body height ?anteriorly, new since 2016, but without marrow edema to suggest ?acute or subacute fracture. The appearance is likely unchanged ?compared to the radiographs from 01/22/2019. The other vertebral ?body heights are preserved. Background marrow signal is normal. ?There is a small intraosseous hemangioma in the right L5 vertebral ?body. There is no suspicious marrow signal abnormality or marrow ?edema. ?  ?Conus medullaris and cauda equina: Conus extends to the L1 level. ?Conus and cauda equina appear normal. ?  ?Paraspinal and other soft tissues: Unremarkable. ?  ?Disc levels: ?  ?There is mild multilevel disc desiccation and narrowing, overall ?progressed since 2016. There is multilevel facet arthropathy, most ?advanced at L4-L5 and worse on the right at this level. ?   ?T12-L1: No significant spinal canal or neural foraminal stenosis ?  ?L1-L2: There is a mild disc bulge eccentric to the left and mild ?facet arthropathy resulting in mild left and no significant right ?neural foraminal stenosis and no significant spinal canal stenosis, ?not significantly changed. ?  ?L2-L3: There is a mild disc bulge and mild bilateral facet ?arthropathy resulting in mild right and no significant left neural ?foraminal stenosis without significant spinal canal stenosis ?minimally progressed since 2016. ?  ?L3-L4: There is a mild disc bulge and mild bilateral facet ?arthropathy resulting in mild narrowing of the left subarticular ?zone without evidence of frank nerve root impingement and mild left ?and no significant right neural foraminal stenosis slightly ?progressed since 2016. ?  ?L4-L5: There is a mild disc bulge and mild bilateral facet ?arthropathy with a trace right effusion without significant spinal ?canal or neural foraminal stenosis. The facet arthropathy has ?slightly progressed since 2016 ?  ?L5-S1: There is a broad-based left subarticular/foraminal protrusion ?and mild bilateral facet arthropathy. The protrusion abuts but does ?not appear to impinge the traversing left S1 nerve root. There is ?mild left and no significant right neural foraminal stenosis. ?Findings are slightly progressed since 2016. ?  ?IMPRESSION: ?1. Mild anterior compression deformity of the L3 vertebral body is ?new since 2016 but without marrow edema to suggest acute fracture, ?similar in appearance to the radiographs from 2020. Otherwise, no ?evidence of acute injury in the lumbar  spine. ?2. Multilevel facet arthropathy, most advanced at L4-L5 and worse on ?the right, slightly progressed since 2016. ?3. Otherwise, overall mild multilevel degenerative changes as ?detailed above have slightly progressed since 2016, but with no ?high-grade spinal canal or neural foraminal stenosis, and no ?evidence of nerve root  impingement to explain the patient's ?right-sided pain. ?4. Mild levoscoliosis centered at L2. ?  ?  ?Electronically Signed ?  By: Valetta Mole M.D. ?  On: 10/11/2021 10:04  ? ?She reports that she quit smoking

## 2021-11-01 MED ORDER — TRIAMCINOLONE ACETONIDE 40 MG/ML IJ SUSP
40.0000 mg | INTRAMUSCULAR | Status: AC | PRN
  Administered 2021-10-27: 40 mg via INTRA_ARTICULAR

## 2021-11-01 MED ORDER — BUPIVACAINE HCL 0.25 % IJ SOLN
5.0000 mL | INTRAMUSCULAR | Status: AC | PRN
  Administered 2021-10-27: 5 mL via INTRA_ARTICULAR

## 2021-11-02 NOTE — Progress Notes (Addendum)
Office Visit Note  Patient: [name]             Date of Birth: June 17, 1941           MRN: [id number]             PCP: Kathyrn Lass, MD Referring: Kathyrn Lass, MD Visit Date: 11/16/2021 Occupation: '@GUAROCC'$ @  Subjective:  Medication monitoring  History of Present Illness: [name] is a 81 y.o. female with history of scleroderma, ILD, osteoarthritis.  She has not noticed any increased skin tightness.  She continues to have some Raynauds symptoms.  She has not noticed any increased shortness of breath.  She has been on mycophenolate 360 mg 1 tablet p.o. twice daily.  She has been experiencing increased fatigue.  She continues to have dry mouth and dry eyes symptoms.  She states she had recent left trochanteric bursa injection by Dr. Ernestina Patches for trochanteric bursitis.  She will also need cortisone injection for her lumbar spine due to radiculopathy.  She has been mild monitoring her blood pressure which has been within normal limits.  Activities of Daily Living:  Patient reports morning stiffness for 2 minutes.   Patient Reports nocturnal pain.  Difficulty dressing/grooming: Denies Difficulty climbing stairs: Reports Difficulty getting out of chair: Denies Difficulty using hands for taps, buttons, cutlery, and/or writing: Denies  Review of Systems  Constitutional:  Positive for fatigue.  HENT:  Positive for mouth dryness.   Eyes:  Positive for dryness.  Respiratory:  Negative for shortness of breath.   Cardiovascular:  Negative for swelling in legs/feet.  Gastrointestinal:  Negative for constipation.  Endocrine: Positive for cold intolerance and excessive thirst.  Genitourinary:  Negative for difficulty urinating.  Musculoskeletal:  Positive for joint pain, joint pain, muscle weakness and morning stiffness.  Skin:  Negative for rash.  Allergic/Immunologic: Negative for susceptible to infections.  Neurological:  Positive for weakness.  Hematological:  Positive for  bruising/bleeding tendency.  Psychiatric/Behavioral:  Positive for sleep disturbance.    PMFS History:  Patient Active Problem List   Diagnosis Date Noted   High risk medication use 02/18/2019   Therapeutic drug monitoring 02/18/2019   Healthcare maintenance 02/18/2019   ILD (interstitial lung disease) ([place]) 11/28/2016   Multiple lung nodules on CT 11/28/2016   Abnormal laboratory test 11/18/2016   DDD L spine 11/18/2016   Scl-70 antibody positive 11/10/2016   History of hypothyroidism 11/10/2016   History of hemochromatosis 11/10/2016   Pulmonary emphysema ([place]) 08/31/2016   [name] phenomenon without gangrene 08/31/2016   Sinusitis, chronic 08/31/2016   Chronic cough 02/10/2016   Irritable larynx 02/10/2016   Stopped smoking with greater than 40 pack year history 02/10/2016   Chest pain, unspecified 10/06/2010   Shortness of breath 10/06/2010   Hypothyroidism    Hereditary hemochromatosis ([place]) 04/22/2010   ATRIAL FIBRILLATION 10/20/2008   PAC 10/20/2008   SYNCOPE AND COLLAPSE 10/20/2008    Past Medical History:  Diagnosis Date   Atrial fibrillation ([place])    a. Flecainide therapy;  b. event monitor 4/12   Chest pain    a. GXT myoview 4/12: no isch., EF 86%;   b. 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 Beryle Beams   History of syncope    Hypothyroidism     Family History  Problem Relation Age of Onset  Stroke Mother    Heart disease Father 42   Hemochromatosis Brother    Breast cancer Neg Hx    Past Surgical History:  Procedure Laterality Date   MOUTH SURGERY     SHOULDER ARTHROSCOPY Left    TOTAL ABDOMINAL HYSTERECTOMY     VARICOSE VEIN SURGERY     Social History   Social History Narrative   REGULAR EXERCISE   Immunization History  Administered Date(s) Administered   Fluad Quad(high Dose 65+) 02/18/2019    Influenza Split 03/27/2019   Influenza, High Dose Seasonal PF 08/29/2017, 04/26/2018, 04/29/2020, 05/12/2021   PFIZER(Purple Top)SARS-COV-2 Vaccination 07/22/2019, 08/12/2019, 03/11/2020   Pneumococcal Conjugate-13 06/12/2014   Pneumococcal Polysaccharide-23 06/27/2010, 04/25/2018   Tdap 03/16/2011, 07/05/2019   Zoster Recombinat (Shingrix) 04/08/2019   Zoster, Live 06/27/2010, 04/08/2019     Objective: Vital Signs: BP 132/75 (BP Location: Left Arm, Patient Position: Sitting, Cuff Size: Small)   Pulse 61   Resp 12   Ht 5' 3.5" (1.613 m)   Wt 139 lb 12.8 oz (63.4 kg)   BMI 24.38 kg/m    Physical Exam Vitals and nursing note reviewed.  Constitutional:      Appearance: She is well-developed.  HENT:     Head: Normocephalic and atraumatic.  Eyes:     Conjunctiva/sclera: Conjunctivae normal.  Cardiovascular:     Rate and Rhythm: Normal rate and regular rhythm.     Heart sounds: Normal heart sounds.  Pulmonary:     Effort: Pulmonary effort is normal.     Breath sounds: Normal breath sounds.  Abdominal:     General: Bowel sounds are normal.     Palpations: Abdomen is soft.  Musculoskeletal:     Cervical back: Normal range of motion.  Lymphadenopathy:     Cervical: No cervical adenopathy.  Skin:    General: Skin is warm and dry.     Capillary Refill: Capillary refill takes 2 to 3 seconds.     Comments: No sclerodactyly was noted.  Mild capillary dilation was noted.  No digital ulcers were noted.  Telangiectasia were noted.  Neurological:     Mental Status: She is alert and oriented to person, place, and time.  Psychiatric:        Behavior: Behavior normal.     Musculoskeletal Exam: She has some limitation with lateral rotation.  She had painful limited range of motion of her lumbar spine.  Shoulder joints, elbow joints, wrist joints, MCPs PIPs and DIPs with good range of motion with no synovitis.  She had PIP and DIP thickening and CMC thickening consistent with  osteoarthritis.  Hip joints and knee joints in good range of motion.  No warmth swelling or effusion was noted.  There was no tenderness over ankles or MTPs.  She had tenderness over bilateral trochanteric bursa.  CDAI Exam: CDAI Score: -- Patient Global: --; Provider Global: -- Swollen: --; Tender: -- Joint Exam 11/16/2021   No joint exam has been documented for this visit   There is currently no information documented on the homunculus. Go to the Rheumatology activity and complete the homunculus joint exam.  Investigation: No additional findings.  Imaging: No results found.  Recent Labs: Lab Results  Component Value Date   WBC 5.8 07/23/2021   HGB 10.8 (L) 07/23/2021   PLT 215.0 07/23/2021   NA 138 07/23/2021   K 4.1 07/23/2021   CL 102 07/23/2021   CO2 31 07/23/2021   GLUCOSE 84 07/23/2021   BUN 17 07/23/2021  CREATININE 0.81 07/23/2021   BILITOT 0.4 07/23/2021   ALKPHOS 43 07/23/2021   AST 13 07/23/2021   ALT 9 07/23/2021   PROT 6.4 07/23/2021   ALBUMIN 4.1 07/23/2021   CALCIUM 9.3 07/23/2021   GFRAA 101 06/09/2020   QFTBGOLDPLUS NEGATIVE 08/10/2017    Speciality Comments: No specialty comments available.  Procedures:  No procedures performed Allergies: Patient has no known allergies.   Assessment / Plan:     Visit Diagnoses: Scleroderma ([place]) - Nailbed capillary changes, anticardiolipin antibody+, Scl-70+, ILD: -She continues to have [name] symptoms.  She also complains of sicca symptoms.  She denies any history of digital ulcers.  She states she has not used nitroglycerin in a long time.  With the warmer weather her symptoms have improved.  She has not noticed any increased shortness of breath.  She has been followed by Dr. Chase Caller.  Plan: Urinalysis, Routine w reflex microscopic  Anticardiolipin antibody positive - Positive anticardiolipin IgM 47, positive beta-2 IgM 31.  She remains on Eliquis as prescribed. -I will check anticardiolipin and beta-2  antibodies today.  Plan: Beta-2 glycoprotein antibodies, Cardiolipin antibodies, IgG, IgM, IgA, C3 and C4  ILD (interstitial lung disease) ([place]) - She is followed by Dr. Chase Caller.  She denies any increased shortness of breath.  She had fine crackles in the base of her lungs.  She had high-resolution CT on August 24, 2021.  Patient states she also had an echocardiogram.  No change in treatment was advised by Dr. Chase Caller.  I reviewed his note from September 09, 2021.  Patient had echocardiogram on February 2023 which did not show pulmonary hypertension.  PFTs done in March 2023 showed 3% decline in annual DLCO which was minimal.  High risk medication use - Myfortic 360 mg 1 tablet by mouth twice daily, Bactrim DS on Mondays, Wednesdays, and Fridays and prednisone 5 mg 1 tablet by mouth daily by Dr. Chase Caller.   - Plan: CBC with Differential/Platelet, COMPLETE METABOLIC PANEL WITH GFR today and then every 3 months.  Long term systemic steroid user-she is on prednisone 5 mg p.o. daily for ILD by Dr. Chase Caller.  I strongly advised not to take prednisone more than 15 mg p.o. daily due to increased risk of malignant hypertension in the scleroderma.  Primary osteoarthritis of both hands- Protection muscle strengthening was discussed.  She had bilateral CMC PIP and DIP thickening.  Trochanteric bursitis of both hips-she continues to have some discomfort over the trochanteric region.  IT band stretches were discussed.  DDD (degenerative disc disease), lumbar - followed by Dr. Erlinda Hong.  Age-related osteoporosis without current pathological fracture - DEXA scan from Nov 04, 2019 T score -2.9 left femoral neck, BMD 0.532.  DEXA will be due in May 2023.  I advised her to schedule a repeat DEXA scan.  Other medical problems are listed as follows:  History of atrial fibrillation  Multiple lung nodules on CT - Most recent HRCT was on 03/16/20.  History of emphysema  Former smoker  History of hemochromatosis -  Followed by PCP.  She has not required a phlebotomy in several years.  History of hypothyroidism  Vitamin D deficiency  Orders: Orders Placed This Encounter  Procedures   CBC with Differential/Platelet   COMPLETE METABOLIC PANEL WITH GFR   Urinalysis, Routine w reflex microscopic   Beta-2 glycoprotein antibodies   Cardiolipin antibodies, IgG, IgM, IgA   C3 and C4   No orders of the defined types were placed in this encounter.  Follow-Up Instructions: Return in about 5 months (around 04/18/2022) for Scleroderma.   Bo Merino, MD  Note - This record has been created using Editor, commissioning.  Chart creation errors have been sought, but may not always  have been located. Such creation errors do not reflect on  the standard of medical care.

## 2021-11-16 ENCOUNTER — Encounter: Payer: Self-pay | Admitting: Rheumatology

## 2021-11-16 ENCOUNTER — Ambulatory Visit: Payer: Medicare Other | Admitting: Rheumatology

## 2021-11-16 VITALS — BP 132/75 | HR 61 | Resp 12 | Ht 63.5 in | Wt 139.8 lb

## 2021-11-16 DIAGNOSIS — R918 Other nonspecific abnormal finding of lung field: Secondary | ICD-10-CM

## 2021-11-16 DIAGNOSIS — M19041 Primary osteoarthritis, right hand: Secondary | ICD-10-CM

## 2021-11-16 DIAGNOSIS — Z87891 Personal history of nicotine dependence: Secondary | ICD-10-CM

## 2021-11-16 DIAGNOSIS — Z79899 Other long term (current) drug therapy: Secondary | ICD-10-CM | POA: Diagnosis not present

## 2021-11-16 DIAGNOSIS — M7062 Trochanteric bursitis, left hip: Secondary | ICD-10-CM

## 2021-11-16 DIAGNOSIS — M7061 Trochanteric bursitis, right hip: Secondary | ICD-10-CM

## 2021-11-16 DIAGNOSIS — M7122 Synovial cyst of popliteal space [Baker], left knee: Secondary | ICD-10-CM

## 2021-11-16 DIAGNOSIS — J849 Interstitial pulmonary disease, unspecified: Secondary | ICD-10-CM | POA: Diagnosis not present

## 2021-11-16 DIAGNOSIS — M5136 Other intervertebral disc degeneration, lumbar region: Secondary | ICD-10-CM

## 2021-11-16 DIAGNOSIS — Z8639 Personal history of other endocrine, nutritional and metabolic disease: Secondary | ICD-10-CM

## 2021-11-16 DIAGNOSIS — E559 Vitamin D deficiency, unspecified: Secondary | ICD-10-CM

## 2021-11-16 DIAGNOSIS — M19042 Primary osteoarthritis, left hand: Secondary | ICD-10-CM

## 2021-11-16 DIAGNOSIS — R760 Raised antibody titer: Secondary | ICD-10-CM | POA: Diagnosis not present

## 2021-11-16 DIAGNOSIS — Z8679 Personal history of other diseases of the circulatory system: Secondary | ICD-10-CM

## 2021-11-16 DIAGNOSIS — M349 Systemic sclerosis, unspecified: Secondary | ICD-10-CM | POA: Diagnosis not present

## 2021-11-16 DIAGNOSIS — Z7952 Long term (current) use of systemic steroids: Secondary | ICD-10-CM

## 2021-11-16 DIAGNOSIS — M810 Age-related osteoporosis without current pathological fracture: Secondary | ICD-10-CM

## 2021-11-16 DIAGNOSIS — Z8709 Personal history of other diseases of the respiratory system: Secondary | ICD-10-CM

## 2021-11-16 NOTE — Patient Instructions (Addendum)
Standing Labs We placed an order today for your standing lab work.   Please have your standing labs drawn in August and every 3 months  If possible, please have your labs drawn 2 weeks prior to your appointment so that the provider can discuss your results at your appointment.  Please note that you may see your imaging and lab results in [place] before we have reviewed them. We may be awaiting multiple results to interpret others before contacting you. Please allow our office up to 72 hours to thoroughly review all of the results before contacting the office for clarification of your results.  We have open lab daily: Monday through Thursday from 1:30-4:30 PM and Friday from 1:30-4:00 PM at the office of Dr. Bo Merino, [facility].   Please be advised, all patients with office appointments requiring lab work will take precedent over walk-in lab work.  If possible, please come for your lab work on Monday and Friday afternoons, as you may experience shorter wait times. The office is located at [address], [place], [place], [place] [address] No appointment is necessary.   Labs are drawn by [facility]. Please bring your co-pay at the time of your lab draw.  You may receive a bill from [facility] for your lab work.  Please note if you are on Hydroxychloroquine and and an order has been placed for a Hydroxychloroquine level, you will need to have it drawn 4 hours or more after your last dose.  If you wish to have your labs drawn at another location, please call the office 24 hours in advance to send orders.  If you have any questions regarding directions or hours of operation,  please call [phone].   As a reminder, please drink plenty of water prior to coming for your lab work. Thanks!  Vaccines You are taking a medication(s) that can suppress your immune system.  The following immunizations are recommended: Flu annually Covid-19  Td/Tdap (tetanus, diphtheria, pertussis)  every 10 years Pneumonia (Prevnar 15 then Pneumovax 23 at least 1 year apart.  Alternatively, can take Prevnar 20 without needing additional dose) Shingrix: 2 doses from 4 weeks to 6 months apart  Please check with your PCP to make sure you are up to date.    Please contact [facility] to schedule your DEXA scan [phone]

## 2021-11-17 ENCOUNTER — Telehealth: Payer: Self-pay | Admitting: *Deleted

## 2021-11-17 ENCOUNTER — Encounter: Payer: Self-pay | Admitting: Internal Medicine

## 2021-11-17 ENCOUNTER — Ambulatory Visit: Payer: Medicare Other | Admitting: Internal Medicine

## 2021-11-17 VITALS — BP 130/78 | HR 58 | Ht 63.5 in | Wt 141.0 lb

## 2021-11-17 DIAGNOSIS — I480 Paroxysmal atrial fibrillation: Secondary | ICD-10-CM

## 2021-11-17 DIAGNOSIS — R0683 Snoring: Secondary | ICD-10-CM

## 2021-11-17 DIAGNOSIS — R400 Somnolence: Secondary | ICD-10-CM | POA: Diagnosis not present

## 2021-11-17 NOTE — Progress Notes (Signed)
Patient Care Team: Kathyrn Lass, MD as PCP - General (Family Medicine) Deboraha Sprang, MD as PCP - Cardiology (Cardiology) Annia Belt, MD as Consulting Physician (Hematology) Deboraha Sprang, MD as Consulting Physician (Cardiology) Richmond Campbell, MD as Consulting Physician (Gastroenterology)   HPI  [name] is a 81 y.o. female seen in followup for atrial fibrillation for which she takes flecainide 50 mg twice daily. This occurs  in the context of hemochromatosis.   .        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   Normal nuclear study 4/15  Seen in the ER 1/20 for atrial fibrillation with a rapid rate associated with chest pain and dyspnea.  Cardioverted and discharged.  No intercurrent atrial fibrillation of which she is aware.  No bleeding apart from superficial bruising on her anticoagulant.  Complains of fatigue.  Sleep disordered breathing.  Daytime somnolence.  Interval CT scanning has demonstrated no significant interval change   Date Cr K Hgb  4/18 0.74   12.4  1/20 0.78 3.9 11.4  3/20 0.75 3.8 12.0  8/20 0.65 4.4 12.5  2/22 0.78 3.9 12.0  5/23 0.75 4.3 11.8    DATE PR interval QRSduration Dose  1/20 164 78 50  3/20 162 92 50  10/20 160 90 100  3/22 208 86 100  5/23 210 92 100      DATE TEST EF   1/21 Echo   60-65 %   3/23 Echo   60-65 %                Her CHADS-VASc score is 3; age/gender.   anticoagulation with Rivaroxaban   Past Medical History:  Diagnosis Date   Atrial fibrillation ([place])    a. Flecainide therapy;  b. event monitor 4/12   Chest pain    a. GXT myoview 4/12: no isch., EF 86%;   b. echo 4/12: EF 55-65%, grade 1 diast dysfxn, LAE   Depression with anxiety    GERD (gastroesophageal reflux disease)    Hemochromatosis    indentified by the C282Y  gene mutation; Dr. Beryle Beams   History of syncope    Hypothyroidism     Past Surgical History:  Procedure Laterality Date   MOUTH SURGERY     SHOULDER ARTHROSCOPY Left    TOTAL ABDOMINAL HYSTERECTOMY     VARICOSE VEIN SURGERY      Current Outpatient Medications  Medication Sig Dispense Refill   acetaminophen (TYLENOL) 325 MG tablet Take 650 mg by mouth every 6 (six) hours as needed for mild pain or headache.     apixaban (ELIQUIS) 5 MG TABS tablet TAKE 1 TABLET(5 MG) BY MOUTH TWICE DAILY 180 tablet 1   atorvastatin (LIPITOR) 10 MG tablet Take 10 mg by mouth daily.     Cyanocobalamin (VITAMIN B 12 PO) Take 1,000 mcg by mouth daily.     flecainide (TAMBOCOR) 100 MG tablet Take 1 tablet (100 mg total) by mouth 2 (two) times daily. 528 tablet 0   folic acid (FOLVITE) 1 MG tablet Take 1 mg by mouth daily.     levothyroxine (SYNTHROID, LEVOTHROID) 112 MCG tablet Take 112 mcg by mouth daily.     methocarbamol (ROBAXIN) 500 MG tablet Take 1 tablet (500 mg total) by mouth daily as needed for muscle  spasms. 30 tablet 2   metoprolol succinate (TOPROL-XL) 25 MG 24 hr tablet TAKE 1 TABLET BY MOUTH  DAILY 90 tablet 2   mycophenolate (MYFORTIC) 360 MG TBEC EC tablet TAKE 1 TABLET BY MOUTH TWICE DAILY 180 tablet 3   nitroGLYCERIN (NITROGLYN) 2 % ointment Apply 1/4 inch to fingertips three times daily as needed. 30 g 1   ondansetron (ZOFRAN ODT) 4 MG disintegrating tablet Take 1-2 tablets three times daily as needed 42 tablet 1   pantoprazole (PROTONIX) 20 MG tablet Take 20 mg by mouth 2 (two) times daily.     predniSONE (DELTASONE) 5 MG tablet TAKE 1 TABLET(5 MG) BY MOUTH DAILY WITH BREAKFAST 90 tablet 2   SPIRIVA RESPIMAT 2.5 MCG/ACT AERS INHALE 2 PUFFS INTO THE LUNGS DAILY 4 g 10   sulfamethoxazole-trimethoprim (BACTRIM DS) 800-160 MG tablet TAKE 1 TABLET BY MOUTH ON MONDAY, WEDNESDAY AND FRIDAY 90 tablet 3   VITAMIN D PO Take 5,000 Units by mouth 4 (four) times a week.     No current  facility-administered medications for this visit.    No Known Allergies  Review of Systems negative except from HPI and PMH  Physical Exam BP 130/78   Pulse (!) 58   Ht 5' 3.5" (1.613 m)   Wt 141 lb (64 kg)   SpO2 96%   BMI 24.59 kg/m  Well developed and nourished in no acute distress HENT normal Neck supple with JVP-  flat   Carotids without bruits Clear Regular rate and rhythm, no murmurs or gallops No Clubbing cyanosis edema Skin-warm and dry A & Oriented  Grossly normal sensory and motor function  ECG sinus at 58 Intervals 21/09/45 Low voltage RSR prime S prime lead V1   Assessment and  Plan  Atrial Fibrillation-persistent     Raynauds  Pulmonary fibrosis  Hemochromatosis   Low Voltage  Senile Purpura   Sleep disordered breathing  No interval atrial fibrillation of which she is aware.  We will continue flecainide at 100 mg twice daily.  We will continue Eliquis at 5 mg twice daily.  She is now 81 but her weight is really about 64-5 kg. Biggest issue related to this is the superficial bruising. Blood pressure is in the 130 range.  We will continue her on the metoprolol at 25 mg daily and monitor.  Sleep disordered breathing.  Daytime somnolence and snoring.  I have reached out to Dr. MR regarding his sleep study

## 2021-11-17 NOTE — Patient Instructions (Addendum)
Medication Instructions:  Your physician recommends that you continue on your current medications as directed. Please refer to the Current Medication list given to you today.  *If you need a refill on your cardiac medications before your next appointment, please call your pharmacy*   Lab Work: None ordered.  If you have labs (blood work) drawn today and your tests are completely normal, you will receive your results only by: [place] (if you have MyChart) OR A paper copy in the mail If you have any lab test that is abnormal or we need to change your treatment, we will call you to review the results.   Testing/Procedures:  Someone will call you to schedule Your physician has recommended that you have a sleep study. This test records several body functions during sleep, including: brain activity, eye movement, oxygen and carbon dioxide blood levels, heart rate and rhythm, breathing rate and rhythm, the flow of air through your mouth and nose, snoring, body muscle movements, and chest and belly movement.     Follow-Up: At [facility], you and your health needs are our priority.  As part of our continuing mission to provide you with exceptional heart care, we have created designated Provider Care Teams.  These Care Teams include your primary Cardiologist (physician) and Advanced Practice Providers (APPs -  Physician Assistants and Nurse Practitioners) who all work together to provide you with the care you need, when you need it.  We recommend signing up for the patient portal called "MyChart".  Sign up information is provided on this After Visit Summary.  MyChart is used to connect with patients for Virtual Visits (Telemedicine).  Patients are able to view lab/test results, encounter notes, upcoming appointments, etc.  Non-urgent messages can be sent to your provider as well.   To learn more about what you can do with MyChart, go to [email or web address].    Your next appointment:    12 months with Dr Caryl Comes  Important Information About Sugar

## 2021-11-17 NOTE — Telephone Encounter (Signed)
-----   Message from Thora Lance, RN sent at 11/17/2021 11:43 AM EDT ----- Regarding: home sleep test Please precert and schedule.  Thank You,  Dan Europe

## 2021-11-21 LAB — COMPLETE METABOLIC PANEL WITH GFR
AG Ratio: 2 (calc) (ref 1.0–2.5)
ALT: 13 U/L (ref 6–29)
AST: 13 U/L (ref 10–35)
Albumin: 4.4 g/dL (ref 3.6–5.1)
Alkaline phosphatase (APISO): 54 U/L (ref 37–153)
BUN: 18 mg/dL (ref 7–25)
CO2: 28 mmol/L (ref 20–32)
Calcium: 9.8 mg/dL (ref 8.6–10.4)
Chloride: 101 mmol/L (ref 98–110)
Creat: 0.75 mg/dL (ref 0.60–0.95)
Globulin: 2.2 g/dL (calc) (ref 1.9–3.7)
Glucose, Bld: 91 mg/dL (ref 65–99)
Potassium: 4.3 mmol/L (ref 3.5–5.3)
Sodium: 139 mmol/L (ref 135–146)
Total Bilirubin: 0.5 mg/dL (ref 0.2–1.2)
Total Protein: 6.6 g/dL (ref 6.1–8.1)
eGFR: 80 mL/min/{1.73_m2} (ref >=60)

## 2021-11-21 LAB — BETA-2 GLYCOPROTEIN ANTIBODIES
Beta-2 Glyco 1 IgA: 28.8 U/mL — ABNORMAL HIGH (ref <20.0)
Beta-2 Glyco 1 IgM: 11 U/mL (ref <20.0)
Beta-2 Glyco I IgG: <2 U/mL (ref <20.0)

## 2021-11-21 LAB — CBC WITH DIFFERENTIAL/PLATELET
Absolute Monocytes: 546 cells/uL (ref 200–950)
Basophils Absolute: 75 cells/uL (ref 0–200)
Basophils Relative: 0.7 %
Eosinophils Absolute: 64 cells/uL (ref 15–500)
Eosinophils Relative: 0.6 %
HCT: 36.7 % (ref 35.0–45.0)
Hemoglobin: 11.8 g/dL (ref 11.7–15.5)
Lymphs Abs: 1017 cells/uL (ref 850–3900)
MCH: 32.2 pg (ref 27.0–33.0)
MCHC: 32.2 g/dL (ref 32.0–36.0)
MCV: 100 fL (ref 80.0–100.0)
MPV: 11.8 fL (ref 7.5–12.5)
Monocytes Relative: 5.1 %
Neutro Abs: 8999 cells/uL — ABNORMAL HIGH (ref 1500–7800)
Neutrophils Relative %: 84.1 %
Platelets: 300 10*3/uL (ref 140–400)
RBC: 3.67 10*6/uL — ABNORMAL LOW (ref 3.80–5.10)
RDW: 13.3 % (ref 11.0–15.0)
Total Lymphocyte: 9.5 %
WBC: 10.7 10*3/uL (ref 3.8–10.8)

## 2021-11-21 LAB — URINALYSIS, ROUTINE W REFLEX MICROSCOPIC
Bilirubin Urine: NEGATIVE
Glucose, UA: NEGATIVE
Hgb urine dipstick: NEGATIVE
Ketones, ur: NEGATIVE
Leukocytes,Ua: NEGATIVE
Nitrite: NEGATIVE
Protein, ur: NEGATIVE
Specific Gravity, Urine: 1.019 (ref 1.001–1.035)
pH: 5.5 (ref 5.0–8.0)

## 2021-11-21 LAB — C3 AND C4
C3 Complement: 138 mg/dL (ref 83–193)
C4 Complement: 29 mg/dL (ref 15–57)

## 2021-11-21 LAB — CARDIOLIPIN ANTIBODIES, IGG, IGM, IGA
Anticardiolipin IgA: 38.4 APL-U/mL — ABNORMAL HIGH (ref <20.0)
Anticardiolipin IgG: <2 GPL-U/mL (ref <20.0)
Anticardiolipin IgM: 8.7 MPL-U/mL (ref <20.0)

## 2021-11-22 NOTE — Progress Notes (Signed)
CBC, CMP, UA are within normal limits.  C3-C4 normal.  Anticardiolipin IgA low  positive, beta-2 GPI low positive.  No change in treatment advised.

## 2021-12-01 ENCOUNTER — Encounter: Payer: Self-pay | Admitting: Physical Medicine and Rehabilitation

## 2021-12-01 ENCOUNTER — Ambulatory Visit: Payer: Self-pay

## 2021-12-01 ENCOUNTER — Ambulatory Visit: Payer: Medicare Other | Admitting: Physical Medicine and Rehabilitation

## 2021-12-01 VITALS — BP 120/75 | HR 66

## 2021-12-01 DIAGNOSIS — M5416 Radiculopathy, lumbar region: Secondary | ICD-10-CM | POA: Diagnosis not present

## 2021-12-01 MED ORDER — METHYLPREDNISOLONE ACETATE 80 MG/ML IJ SUSP
80.0000 mg | Freq: Once | INTRAMUSCULAR | Status: AC
  Administered 2021-12-01: 80 mg

## 2021-12-01 NOTE — Patient Instructions (Signed)

## 2021-12-01 NOTE — Progress Notes (Unsigned)
Pt state lower back pain that travels to her right hip, buttocks, leg and ankle. Pt state when getting up from an sitting position the pain gets worse. Pt state she takes over the counter pain meds to help ease her pain  Numeric Pain Rating Scale and Functional Assessment Average Pain 7   In the last MONTH (on 0-10 scale) has pain interfered with the following?  1. General activity like being  able to carry out your everyday physical activities such as walking, climbing stairs, carrying groceries, or moving a chair?  Rating(10)   +Driver, +BT, -Dye Allergies.

## 2021-12-01 NOTE — Progress Notes (Unsigned)
[name] - 81 y.o. female MRN [id number]  Date of birth: [birth year]  Office Visit Note: Visit Date: 12/01/2021 PCP: Kathyrn Lass, MD Referred by: Kathyrn Lass, MD  Subjective: Chief Complaint  Patient presents with   Lower Back - Pain   Right Hip - Pain   Right Leg - Pain   HPI:  [name] is a 81 y.o. female who comes in todayHPI ROS Otherwise per HPI.  Assessment & Plan: Visit Diagnoses:    ICD-10-CM   1. Lumbar radiculopathy  M54.16 XR C-ARM NO REPORT    Epidural Steroid injection    methylPREDNISolone acetate (DEPO-MEDROL) injection 80 mg      Plan: No additional findings.   Meds & Orders:  Meds ordered this encounter  Medications   methylPREDNISolone acetate (DEPO-MEDROL) injection 80 mg    Orders Placed This Encounter  Procedures   XR C-ARM NO REPORT   Epidural Steroid injection    Follow-up: No follow-ups on file.   Procedures: No procedures performed      Clinical History: EXAM: MRI LUMBAR SPINE WITHOUT CONTRAST   TECHNIQUE: Multiplanar, multisequence MR imaging of the lumbar spine was performed. No intravenous contrast was administered.   COMPARISON:  MR lumbar 05/01/2015; X-ray lumbar 10/05/2021.   FINDINGS: Segmentation: Standard; the lowest formed disc space is designated L5-S1.   Alignment: There is mild levocurvature centered at L2. Trace anterolisthesis of L4 on L5 is unchanged. There is no other antero or retrolisthesis.   Vertebrae: There is mild compression deformity of the L3 vertebral body with up to approximately 10% loss of vertebral body height anteriorly, new since 2016, but without marrow edema to suggest acute or subacute fracture. The appearance is likely unchanged compared to the radiographs from 01/22/2019. The other vertebral body heights are preserved. Background marrow signal is normal. There is a small intraosseous hemangioma in the right L5 vertebral body. There is no suspicious marrow signal  abnormality or marrow edema.   Conus medullaris and cauda equina: Conus extends to the L1 level. Conus and cauda equina appear normal.   Paraspinal and other soft tissues: Unremarkable.   Disc levels:   There is mild multilevel disc desiccation and narrowing, overall progressed since 2016. There is multilevel facet arthropathy, most advanced at L4-L5 and worse on the right at this level.   T12-L1: No significant spinal canal or neural foraminal stenosis   L1-L2: There is a mild disc bulge eccentric to the left and mild facet arthropathy resulting in mild left and no significant right neural foraminal stenosis and no significant spinal canal stenosis, not significantly changed.   L2-L3: There is a mild disc bulge and mild bilateral facet arthropathy resulting in mild right and no significant left neural foraminal stenosis without significant spinal canal stenosis minimally progressed since 2016.   L3-L4: There is a mild disc bulge and mild bilateral facet arthropathy resulting in mild narrowing of the left subarticular zone without evidence of frank nerve root impingement and mild left and no significant right neural foraminal stenosis slightly progressed since 2016.   L4-L5: There is a mild disc bulge and mild bilateral facet arthropathy with a trace right effusion without significant spinal canal or neural foraminal stenosis. The facet arthropathy has slightly progressed since 2016   L5-S1: There is a broad-based left subarticular/foraminal protrusion and mild bilateral facet arthropathy. The protrusion abuts but does not appear to impinge the traversing left S1 nerve root. There is mild left and no significant right neural  foraminal stenosis. Findings are slightly progressed since 2016.   IMPRESSION: 1. Mild anterior compression deformity of the L3 vertebral body is new since 2016 but without marrow edema to suggest acute fracture, similar in appearance to the  radiographs from 2020. Otherwise, no evidence of acute injury in the lumbar spine. 2. Multilevel facet arthropathy, most advanced at L4-L5 and worse on the right, slightly progressed since 2016. 3. Otherwise, overall mild multilevel degenerative changes as detailed above have slightly progressed since 2016, but with no high-grade spinal canal or neural foraminal stenosis, and no evidence of nerve root impingement to explain the patient's right-sided pain. 4. Mild levoscoliosis centered at L2.     Electronically Signed   By: Valetta Mole M.D.   On: 10/11/2021 10:04     Objective:  VS:  HT:    WT:   BMI:     BP:120/75  HR:66bpm  TEMP: ( )  RESP:  Physical Exam   Imaging: No results found.

## 2021-12-17 NOTE — Procedures (Signed)
S1 Lumbosacral Transforaminal Epidural Steroid Injection - Sub-Pedicular Approach with Fluoroscopic Guidance   Patient: [name]      Date of Birth: [birth year] MRN: [id number] PCP: Sigmund Hazel, MD      Visit Date: 12/01/2021   Universal Protocol:    Date/Time: 06/23/233:17 PM  Consent Given By: the patient  Position:  PRONE  Additional Comments: Vital signs were monitored before and after the procedure. Patient was prepped and draped in the usual sterile fashion. The correct patient, procedure, and site was verified.   Injection Procedure Details:  Procedure Site One Meds Administered:  Meds ordered this encounter  Medications   methylPREDNISolone acetate (DEPO-MEDROL) injection 80 mg    Laterality: Right  Location/Site:  S1 Foramen   Needle size: 22 ga.  Needle type: Spinal  Needle Placement: Transforaminal  Findings:   -Comments: Excellent flow of contrast along the nerve, nerve root and into the epidural space.  With initial instillation of 4 mL of cortisone patient has significant paresthesia we will type response in an S1 dermatome.  This did not occur during needling to that position or with contrast administration.  Epidurogram: Contrast epidurogram showed no nerve root cut off or restricted flow pattern.  Procedure Details: After squaring off the sacral end-plate to get a true AP view, the C-arm was positioned so that the best possible view of the S1 foramen was visualized. The soft tissues overlying this structure were infiltrated with 2-3 ml. of 1% Lidocaine without Epinephrine.    The spinal needle was inserted toward the target using a "trajectory" view along the fluoroscope beam.  Under AP and lateral visualization, the needle was advanced so it did not puncture dura. Biplanar projections were used to confirm position. Aspiration was confirmed to be negative for CSF and/or blood. A 1-2 ml. volume of Isovue-250 was injected and flow of contrast  was noted at each level. Radiographs were obtained for documentation purposes.   After attaining the desired flow of contrast documented above, a 0.5 to 1.0 ml test dose of 0.25% Marcaine was injected into each respective transforaminal space.  The patient was observed for 90 seconds post injection.  After no sensory deficits were reported, and normal lower extremity motor function was noted,   the above injectate was administered so that equal amounts of the injectate were placed at each foramen (level) into the transforaminal epidural space.   Additional Comments:  No complications occurred Dressing: Band-Aid with 2 x 2 sterile gauze    Post-procedure details: Patient was observed during the procedure. Post-procedure instructions were reviewed.  Patient left the clinic in stable condition.

## 2021-12-24 ENCOUNTER — Other Ambulatory Visit: Payer: Self-pay | Admitting: *Deleted

## 2021-12-24 DIAGNOSIS — I480 Paroxysmal atrial fibrillation: Secondary | ICD-10-CM

## 2021-12-24 MED ORDER — APIXABAN 5 MG PO TABS: ORAL_TABLET | ORAL | 2 refills | Status: DC

## 2021-12-24 MED ORDER — FLECAINIDE ACETATE 100 MG PO TABS: 100.0000 mg | ORAL_TABLET | Freq: Two times a day (BID) | ORAL | 3 refills | Status: DC

## 2021-12-24 NOTE — Telephone Encounter (Signed)
Eliquis '5mg'$  refill request received. Patient is 81 years old, weight-64kg, Crea-0.75 on 11/16/2021, Diagnosis-Afib, and last seen by Dr. Caryl Comes on 11/17/2021. Dose is appropriate based on dosing criteria. Will send in refill to requested pharmacy.

## 2021-12-29 ENCOUNTER — Other Ambulatory Visit: Payer: Self-pay | Admitting: Internal Medicine

## 2022-01-24 ENCOUNTER — Other Ambulatory Visit: Payer: Self-pay | Admitting: Internal Medicine

## 2022-01-31 ENCOUNTER — Telehealth: Payer: Self-pay | Admitting: Internal Medicine

## 2022-02-01 NOTE — Telephone Encounter (Signed)
Lmtcb for pt.  

## 2022-02-07 ENCOUNTER — Ambulatory Visit (HOSPITAL_BASED_OUTPATIENT_CLINIC_OR_DEPARTMENT_OTHER): Payer: Medicare Other | Attending: Internal Medicine | Admitting: Cardiology

## 2022-02-07 DIAGNOSIS — R0683 Snoring: Secondary | ICD-10-CM | POA: Diagnosis not present

## 2022-02-07 DIAGNOSIS — I480 Paroxysmal atrial fibrillation: Secondary | ICD-10-CM

## 2022-02-07 DIAGNOSIS — R400 Somnolence: Secondary | ICD-10-CM | POA: Insufficient documentation

## 2022-02-07 DIAGNOSIS — G4736 Sleep related hypoventilation in conditions classified elsewhere: Secondary | ICD-10-CM | POA: Diagnosis not present

## 2022-02-07 DIAGNOSIS — G4733 Obstructive sleep apnea (adult) (pediatric): Secondary | ICD-10-CM | POA: Diagnosis not present

## 2022-02-08 NOTE — Procedures (Signed)
   Patient Name: [name], [name] Date: 02/07/2022 Gender: Female D.O.B: 26-Mar-1941 Age (years): 50 Referring Provider: Virl Axe Height (inches): 24 Interpreting Physician: Fransico Him MD, ABSM Weight (lbs): 142 RPSGT: Jacolyn Reedy BMI: 25 MRN: [id number] Neck Size: 14.00  CLINICAL INFORMATION Sleep Study Type: HST  Indication for sleep study: N/A  Epworth Sleepiness Score: N/A  SLEEP STUDY TECHNIQUE A multi-channel overnight portable sleep study was performed. The channels recorded were: nasal airflow, thoracic respiratory movement, and oxygen saturation with a pulse oximetry. Snoring was also monitored.  MEDICATIONS Patient self administered medications include: N/A.  SLEEP ARCHITECTURE Patient was studied for 285.3 minutes. The sleep efficiency was 100.0 % and the patient was supine for 0%. The arousal index was 0.0 per hour.  RESPIRATORY PARAMETERS The overall AHI was 22.3 per hour, with a central apnea index of 0 per hour.  The oxygen nadir was 68% during sleep.  CARDIAC DATA Mean heart rate during sleep was 61.1 bpm.  IMPRESSIONS - Moderate obstructive sleep apnea occurred during this study (AHI = 22.3/h). - Severe oxygen desaturation was noted during this study (Min O2 = 68%). - Patient snored 17.6% during the sleep.  DIAGNOSIS - Obstructive Sleep Apnea (G47.33) - Nocturnal Hypoxemia (G47.36)  RECOMMENDATIONS - Recommend in lab CPAP titration. - Positional therapy avoiding supine position during sleep. - Avoid alcohol, sedatives and other CNS depressants that may worsen sleep apnea and disrupt normal sleep architecture. - Sleep hygiene should be reviewed to assess factors that may improve sleep quality. - Weight management and regular exercise should be initiated or continued.  [Electronically signed] 02/08/2022 09:52 AM  Fransico Him MD, ABSM Diplomate, American Board of Sleep Medicine

## 2022-02-09 ENCOUNTER — Telehealth: Payer: Self-pay | Admitting: Internal Medicine

## 2022-02-09 NOTE — Telephone Encounter (Signed)
Attempted to call pt but unable to reach. Left message for her to return call. Due to  multiple attempts trying to reach pt and unable to do so, per protocol encounter will be closed. 

## 2022-02-09 NOTE — Telephone Encounter (Signed)
Called patient and she was asking if it was safe for her to go to a [place]. She states its located in [place], [place]. She is wanting to make sure its safe for her to go.   MR please advise

## 2022-02-14 NOTE — Telephone Encounter (Signed)
The salt should be ok but [place] where o[place] levels are low?

## 2022-02-14 NOTE — Telephone Encounter (Signed)
Called and left voicemail for patient that she can go to salt cave but would just check o2 levels while she is there for safety. Nothing further needed

## 2022-03-03 ENCOUNTER — Encounter (HOSPITAL_BASED_OUTPATIENT_CLINIC_OR_DEPARTMENT_OTHER): Payer: Medicare Other | Attending: General Surgery | Admitting: General Surgery

## 2022-03-03 DIAGNOSIS — Z79899 Other long term (current) drug therapy: Secondary | ICD-10-CM | POA: Diagnosis not present

## 2022-03-03 DIAGNOSIS — M349 Systemic sclerosis, unspecified: Secondary | ICD-10-CM | POA: Diagnosis not present

## 2022-03-03 DIAGNOSIS — Z7969 Long term (current) use of other immunomodulators and immunosuppressants: Secondary | ICD-10-CM | POA: Diagnosis not present

## 2022-03-03 DIAGNOSIS — J439 Emphysema, unspecified: Secondary | ICD-10-CM | POA: Diagnosis not present

## 2022-03-03 DIAGNOSIS — I7300 Raynaud's syndrome without gangrene: Secondary | ICD-10-CM | POA: Insufficient documentation

## 2022-03-03 DIAGNOSIS — J849 Interstitial pulmonary disease, unspecified: Secondary | ICD-10-CM | POA: Insufficient documentation

## 2022-03-03 DIAGNOSIS — L97812 Non-pressure chronic ulcer of other part of right lower leg with fat layer exposed: Secondary | ICD-10-CM | POA: Diagnosis present

## 2022-03-03 DIAGNOSIS — Z7952 Long term (current) use of systemic steroids: Secondary | ICD-10-CM | POA: Insufficient documentation

## 2022-03-03 NOTE — Progress Notes (Addendum)
ISABELLAH, SOBOCINSKI ([id number]) Visit Report for 03/03/2022 Chief Complaint Document Details Patient Name: Date of Service: [name], [name] 03/03/2022 1:30 PM Medical Record Number: [id number] Patient Account Number: [id number] Date of Birth/Sex: Treating RN: March 22, 1941 (81 y.o. [name] Primary Care Provider: Juline Patch Other Clinician: Referring Provider: Treating Provider/Extender: Manson Allan in Treatment: 0 Information Obtained from: Patient Chief Complaint Patient seen for complaints of Non-Healing Wound. Electronic Signature(s) Signed: 03/03/2022 2:45:51 PM By: Fredirick Maudlin MD FACS Previous Signature: 03/03/2022 1:44:17 PM Version By: Fredirick Maudlin MD FACS Entered By: Fredirick Maudlin on 03/03/2022 14:45:51 -------------------------------------------------------------------------------- Debridement Details Patient Name: Date of Service: [name], [name] 03/03/2022 1:30 PM Medical Record Number: [id number] Patient Account Number: [id number] Date of Birth/Sex: Treating RN: [birth year] (81 y.o. [name], [name] Primary Care Provider: Juline Patch Other Clinician: Referring Provider: Treating Provider/Extender: Manson Allan in Treatment: 0 Debridement Performed for Assessment: Wound #1 Right,Anterior Lower Leg Performed By: Physician Fredirick Maudlin, MD Debridement Type: Debridement Level of Consciousness (Pre-procedure): Awake and Alert Pre-procedure Verification/Time Out Yes - 14:26 Taken: Start Time: 14:27 Pain Control: Lidocaine 5% topical ointment T Area Debrided (L x W): otal 1 (cm) x 1 (cm) = 1 (cm) Tissue and other material debrided: Non-Viable, Eschar Level: Non-Viable Tissue Debridement Description: Selective/Open Wound Instrument: Curette Bleeding: Minimum Hemostasis Achieved: Pressure Procedural Pain: 0 Post Procedural Pain: 0 Response to Treatment: Procedure was tolerated  well Level of Consciousness (Post- Awake and Alert procedure): Post Debridement Measurements of Total Wound Length: (cm) 9 Width: (cm) 5 Depth: (cm) 0.1 Volume: (cm) 3.534 Character of Wound/Ulcer Post Debridement: Requires Further Debridement Post Procedure Diagnosis Same as Pre-procedure Notes Blanche East, RN scribed for Dr. Celine Ahr Electronic Signature(s) Signed: 03/03/2022 4:15:13 PM By: Fredirick Maudlin MD FACS Signed: 03/03/2022 4:37:16 PM By: Blanche East RN Previous Signature: 03/03/2022 2:43:30 PM Version By: Fredirick Maudlin MD FACS Entered By: Blanche East on 03/03/2022 16:10:14 -------------------------------------------------------------------------------- HPI Details Patient Name: Date of Service: [name], [name] 03/03/2022 1:30 PM Medical Record Number: [id number] Patient Account Number: [id number] Date of Birth/Sex: Treating RN: 1940/07/19 (81 y.o. [name] Primary Care Provider: Juline Patch Other Clinician: Referring Provider: Treating Provider/Extender: Manson Allan in Treatment: 0 History of Present Illness HPI Description: ADMISSION 03/03/2022 This is an 81 year old woman with a past medical history significant for scleroderma, interstitial lung disease, [name] syndrome, chronic steroid use, hereditary hemochromatosis cytosis and emphysema. She is chronically on corticosteroids as well as mycophenolate. She was visiting her brother in [place] when she fell walking up the steps to the house. She suffered a laceration to her right anterior lower leg. She was seen at a local healthcare facility. The wound was dressed and she was discharged. She has returned to [place] and was referred to the wound care center for further evaluation and management. She and her daughter have been caring for it at home. They have been applying a nonadherent gauze over the wound and wrapping it with Kerlix and Coban. There is a  U-shaped wound on the patient's anterior tibial surface. The wound edges are actually secured with Steri-Strips and there is no exposed tissue. There does appear to have been some serous drainage that has crusted around the edges of the wound but there is no sign or concern for infection. Just proximal to the main wound, she has a smaller abrasion that does get down  into the fat layer. There is a little bit of slough and eschar present at this site. Electronic Signature(s) Signed: 03/03/2022 2:47:33 PM By: Fredirick Maudlin MD FACS Previous Signature: 03/03/2022 1:48:16 PM Version By: Fredirick Maudlin MD FACS Entered By: Fredirick Maudlin on 03/03/2022 14:47:33 -------------------------------------------------------------------------------- Physical Exam Details Patient Name: Date of Service: [name], [name] 03/03/2022 1:30 PM Medical Record Number: [id number] Patient Account Number: [id number] Date of Birth/Sex: Treating RN: 1941/01/31 (81 y.o. [name] Primary Care Provider: Juline Patch Other Clinician: Referring Provider: Treating Provider/Extender: Theressa Stamps Weeks in Treatment: 0 Constitutional . . . . No acute distress. Respiratory Normal work of breathing on room air.. Notes 03/03/2022: There is a U-shaped wound on the patient's anterior tibial surface. The wound edges are actually secured with Steri-Strips and there is no exposed tissue. There does appear to have been some serous drainage that has crusted around the edges of the wound but there is no sign or concern for infection. Just proximal to the main wound, she has a smaller abrasion that does get down into the fat layer. There is a little bit of slough and eschar present at this site. Electronic Signature(s) Signed: 03/03/2022 2:48:11 PM By: Fredirick Maudlin MD FACS Entered By: Fredirick Maudlin on 03/03/2022  14:48:11 -------------------------------------------------------------------------------- Physician Orders Details Patient Name: Date of Service: [name], [name] 03/03/2022 1:30 PM Medical Record Number: [id number] Patient Account Number: [id number] Date of Birth/Sex: Treating RN: May 14, 1941 (81 y.o. Martyn Malay, Linda Primary Care Provider: Juline Patch Other Clinician: Referring Provider: Treating Provider/Extender: Manson Allan in Treatment: 0 Verbal / Phone Orders: No Diagnosis Coding ICD-10 Coding Code Description [phone] Non-pressure chronic ulcer of other part of right lower leg with fat layer exposed I73.00 [name] syndrome without gangrene E83.110 Hereditary hemochromatosis J84.9 Interstitial pulmonary disease, unspecified J43.9 Emphysema, unspecified Z79.899 Other long term (current) drug therapy Follow-up Appointments ppointment in 1 week. - Dr. Celine Ahr Rm 4 Return A Wednesday 03/09/22 2:30 PM ppointment in 2 weeks. - Dr. Celine Ahr Rm 3 Return A Tuesday 03/15/22 at 12:30 PM Anesthetic (In clinic) Topical Lidocaine 5% applied to wound bed - In clinic, prior to Debridement Bathing/ Shower/ Hygiene May shower with protection but do not get wound dressing(s) wet. - Wear cast protector, [facility] Wound Treatment Wound #1 - Lower Leg Wound Laterality: Right, Anterior Cleanser: Soap and Water Discharge Instructions: May shower and wash wound with dial antibacterial soap and water prior to dressing change. Peri-Wound Care: Sween Lotion (Moisturizing lotion) Discharge Instructions: Apply moisturizing lotion as directed Prim Dressing: KerraCel Ag Gelling Fiber Dressing, 4x5 in (silver alginate) ary Discharge Instructions: Apply silver alginate to wound bed as instructed Secondary Dressing: T Non-Adherent Dressing, 3x4 in elfa Discharge Instructions: Apply over primary dressing as directed. Secondary Dressing: Zetuvit Plus 4x8  in Discharge Instructions: Apply over primary dressing as directed. Secured With: Coban Self-Adherent Wrap 4x5 (in/yd) Discharge Instructions: Secure with Coban as directed. Secured With: [employer], 4.5x3.1 (in/yd) Discharge Instructions: Secure with Kerlix as directed. Secured With: 71M Medipore H Soft Cloth Surgical T ape, 4 x 10 (in/yd) Discharge Instructions: Secure with tape as directed. Electronic Signature(s) Signed: 03/03/2022 2:48:33 PM By: Fredirick Maudlin MD FACS Previous Signature: 03/03/2022 2:43:30 PM Version By: Fredirick Maudlin MD FACS Entered By: Fredirick Maudlin on 03/03/2022 14:48:33 -------------------------------------------------------------------------------- Problem List Details Patient Name: Date of Service: [name], [name] 03/03/2022 1:30 PM Medical Record Number: [id number]  Patient Account Number: [id number] Date of Birth/Sex: Treating RN: 1941-03-23 (81 y.o. [name] Primary Care Provider: Juline Patch Other Clinician: Referring Provider: Treating Provider/Extender: Manson Allan in Treatment: 0 Active Problems ICD-10 Encounter Code Description Active Date MDM Diagnosis [phone] Non-pressure chronic ulcer of other part of right lower leg with fat layer 03/03/2022 No Yes exposed I73.00 [name] syndrome without gangrene 03/03/2022 No Yes E83.110 Hereditary hemochromatosis 03/03/2022 No Yes J84.9 Interstitial pulmonary disease, unspecified 03/03/2022 No Yes J43.9 Emphysema, unspecified 03/03/2022 No Yes Z79.899 Other long term (current) drug therapy 03/03/2022 No Yes Inactive Problems Resolved Problems Electronic Signature(s) Signed: 03/03/2022 4:15:13 PM By: Fredirick Maudlin MD FACS Signed: 03/03/2022 4:37:16 PM By: Blanche East RN Previous Signature: 03/03/2022 2:45:30 PM Version By: Fredirick Maudlin MD FACS Previous Signature: 03/03/2022 1:44:03 PM Version By: Fredirick Maudlin MD FACS Entered By: Blanche East on  03/03/2022 16:04:55 -------------------------------------------------------------------------------- Progress Note Details Patient Name: Date of Service: [name], [name] 03/03/2022 1:30 PM Medical Record Number: [id number] Patient Account Number: [id number] Date of Birth/Sex: Treating RN: [birth year] (81 y.o. [name] Primary Care Provider: Juline Patch Other Clinician: Referring Provider: Treating Provider/Extender: Manson Allan in Treatment: 0 Subjective Chief Complaint Information obtained from Patient Patient seen for complaints of Non-Healing Wound. History of Present Illness (HPI) ADMISSION 03/03/2022 This is an 81 year old woman with a past medical history significant for scleroderma, interstitial lung disease, [name] syndrome, chronic steroid use, hereditary hemochromatosis cytosis and emphysema. She is chronically on corticosteroids as well as mycophenolate. She was visiting her brother in [place] when she fell walking up the steps to the house. She suffered a laceration to her right anterior lower leg. She was seen at a local healthcare facility. The wound was dressed and she was discharged. She has returned to [place] and was referred to the wound care center for further evaluation and management. She and her daughter have been caring for it at home. They have been applying a nonadherent gauze over the wound and wrapping it with Kerlix and Coban. There is a U-shaped wound on the patient's anterior tibial surface. The wound edges are actually secured with Steri-Strips and there is no exposed tissue. There does appear to have been some serous drainage that has crusted around the edges of the wound but there is no sign or concern for infection. Just proximal to the main wound, she has a smaller abrasion that does get down into the fat layer. There is a little bit of slough and eschar present at this site. Patient  History Information obtained from Patient. Allergies No Known Allergies Family History Diabetes - Siblings, Heart Disease - Father, Hypertension - Mother, Kidney Disease - Father, Stroke - Mother, No family history of Cancer, Hereditary Spherocytosis, Lung Disease, Seizures, Thyroid Problems, Tuberculosis. Social History Former smoker - quit 25 yrs ago, Marital Status - Widowed, Alcohol Use - Moderate, Drug Use - No History, Caffeine Use - Daily - coffee. Medical History Eyes Patient has history of Cataracts - bil removed Ear/Nose/Mouth/Throat Denies history of Chronic sinus problems/congestion, Middle ear problems Respiratory Patient has history of Chronic Obstructive Pulmonary Disease (COPD) - mild Denies history of Aspiration, Asthma, Pneumothorax, Sleep Apnea, Tuberculosis Cardiovascular Patient has history of Arrhythmia - afib Endocrine Denies history of Type I Diabetes, Type II Diabetes Genitourinary Denies history of End Stage Renal Disease Immunological Patient has history of Raynaudoos, Scleroderma - lungs Denies history of Lupus Erythematosus Integumentary (Skin) Denies history  of History of Burn Oncologic Denies history of Received Chemotherapy, Received Radiation Psychiatric Patient has history of Confinement Anxiety Denies history of Anorexia/bulimia Hospitalization/Surgery History - mouth surgery. - left shouilder surgery. - total abdominal hysterectomy. Medical A Surgical History Notes nd Ear/Nose/Mouth/Throat hearing loss, uses hearing aids Hematologic/Lymphatic hemochromatosis Respiratory pulmonary fibrosis, emphysema Endocrine hypothyroidism Review of Systems (ROS) Constitutional Symptoms (General Health) Denies complaints or symptoms of Fatigue, Fever, Chills, Marked Weight Change. Eyes Complains or has symptoms of Glasses / Contacts. Ear/Nose/Mouth/Throat Denies complaints or symptoms of Chronic sinus problems or rhinitis. Respiratory Denies  complaints or symptoms of Chronic or frequent coughs, Shortness of Breath. Cardiovascular Denies complaints or symptoms of Chest pain. Gastrointestinal Denies complaints or symptoms of Frequent diarrhea, Nausea, Vomiting. Endocrine Denies complaints or symptoms of Heat/cold intolerance. Genitourinary Denies complaints or symptoms of Frequent urination. Integumentary (Skin) Complains or has symptoms of Wounds - right lower leg. Musculoskeletal Denies complaints or symptoms of Muscle Pain, Muscle Weakness. Neurologic Denies complaints or symptoms of Numbness/parasthesias. Psychiatric Complains or has symptoms of Claustrophobia. Denies complaints or symptoms of Suicidal. Objective Constitutional No acute distress. Vitals Time Taken: 1:39 PM, Height: 63 in, Source: Stated, Weight: 142 lbs, Source: Stated, BMI: 25.2, Temperature: 97.9 F, Pulse: 69 bpm, Respiratory Rate: 18 breaths/min, Blood Pressure: 112/72 mmHg. Respiratory Normal work of breathing on room air.. General Notes: 03/03/2022: There is a U-shaped wound on the patient's anterior tibial surface. The wound edges are actually secured with Steri-Strips and there is no exposed tissue. There does appear to have been some serous drainage that has crusted around the edges of the wound but there is no sign or concern for infection. Just proximal to the main wound, she has a smaller abrasion that does get down into the fat layer. There is a little bit of slough and eschar present at this site. Integumentary (Hair, Skin) Wound #1 status is Open. Original cause of wound was Trauma. The date acquired was: 02/17/2022. The wound is located on the Right,Anterior Lower Leg. The wound measures 9cm length x 5cm width x 0.1cm depth; 35.343cm^2 area and 3.534cm^3 volume. There is Fat Layer (Subcutaneous Tissue) exposed. There is no tunneling or undermining noted. There is a medium amount of serosanguineous drainage noted. The wound margin is flat  and intact. There is small (1-33%) red granulation within the wound bed. There is a large (67-100%) amount of necrotic tissue within the wound bed including Eschar and Adherent Slough. Assessment Active Problems ICD-10 Non-pressure chronic ulcer of other part of right lower leg with fat layer exposed [name] syndrome without gangrene Hereditary hemochromatosis Interstitial pulmonary disease, unspecified Emphysema, unspecified Other long term (current) drug therapy Procedures Wound #1 Pre-procedure diagnosis of Wound #1 is a Skin T located on the Right,Anterior Lower Leg . There was a Selective/Open Wound Non-Viable Tissue ear Debridement with a total area of 1 sq cm performed by Fredirick Maudlin, MD. With the following instrument(s): Curette to remove Non-Viable tissue/material. Material removed includes Eschar after achieving pain control using Lidocaine 5% topical ointment. No specimens were taken. A time out was conducted at 14:26, prior to the start of the procedure. A Minimum amount of bleeding was controlled with Pressure. The procedure was tolerated well with a pain level of 0 throughout and a pain level of 0 following the procedure. Post Debridement Measurements: 9cm length x 5cm width x 0.1cm depth; 3.534cm^3 volume. Character of Wound/Ulcer Post Debridement requires further debridement. Post procedure Diagnosis Wound #1: Same as Pre-Procedure Plan Follow-up Appointments: Return Appointment  in 1 week. - Dr. Celine Ahr Rm 4 Wednesday 03/09/22 2:30 PM Return Appointment in 2 weeks. - Dr. Celine Ahr Rm 3 Tuesday 03/15/22 at 12:30 PM Anesthetic: (In clinic) Topical Lidocaine 5% applied to wound bed - In clinic, prior to Debridement Bathing/ Shower/ Hygiene: May shower with protection but do not get wound dressing(s) wet. - Wear cast protector, [facility] WOUND #1: - Lower Leg Wound Laterality: Right, Anterior Cleanser: Soap and Water Discharge Instructions: May shower and wash wound  with dial antibacterial soap and water prior to dressing change. Peri-Wound Care: Sween Lotion (Moisturizing lotion) Discharge Instructions: Apply moisturizing lotion as directed Prim Dressing: KerraCel Ag Gelling Fiber Dressing, 4x5 in (silver alginate) ary Discharge Instructions: Apply silver alginate to wound bed as instructed Secondary Dressing: T Non-Adherent Dressing, 3x4 in elfa Discharge Instructions: Apply over primary dressing as directed. Secondary Dressing: Zetuvit Plus 4x8 in Discharge Instructions: Apply over primary dressing as directed. Secured With: Coban Self-Adherent Wrap 4x5 (in/yd) Discharge Instructions: Secure with Coban as directed. Secured With: [employer], 4.5x3.1 (in/yd) Discharge Instructions: Secure with Kerlix as directed. Secured With: 22M Medipore H Soft Cloth Surgical T ape, 4 x 10 (in/yd) Discharge Instructions: Secure with tape as directed. 03/03/2022: This is an 81 year old woman on chronic steroid and mycophenolate therapy who suffered a fall while visiting family in [place]. There is a U-shaped wound on the patient's anterior tibial surface. The wound edges are actually secured with Steri-Strips and there is no exposed tissue. There does appear to have been some serous drainage that has crusted around the edges of the wound but there is no sign or concern for infection. Just proximal to the main wound, she has a smaller abrasion that does get down into the fat layer. There is a little bit of slough and eschar present at this site. I used a curette to debride some of the crusting around the edges of the wound as well as the slough and eschar on the more proximal wound. At this point, the skin flap appears viable and I am hopeful that it will remain so. I did not disturb the Steri-Strips. We will apply some silver alginate to the more proximal wound, as well as along the wound edges between the Steri-Strips to help absorb drainage. Continue Kerlix and  Coban wrap. Follow-up in 1 week. Electronic Signature(s) Signed: 03/03/2022 2:49:50 PM By: Fredirick Maudlin MD FACS Entered By: Fredirick Maudlin on 03/03/2022 14:49:49 -------------------------------------------------------------------------------- HxROS Details Patient Name: Date of Service: [name], [name] 03/03/2022 1:30 PM Medical Record Number: [id number] Patient Account Number: [id number] Date of Birth/Sex: Treating RN: 1941/05/30 (81 y.o. [name] Primary Care Provider: Juline Patch Other Clinician: Referring Provider: Treating Provider/Extender: Manson Allan in Treatment: 0 Information Obtained From Patient Constitutional Symptoms (General Health) Complaints and Symptoms: Negative for: Fatigue; Fever; Chills; Marked Weight Change Eyes Complaints and Symptoms: Positive for: Glasses / Contacts Medical History: Positive for: Cataracts - bil removed Ear/Nose/Mouth/Throat Complaints and Symptoms: Negative for: Chronic sinus problems or rhinitis Medical History: Negative for: Chronic sinus problems/congestion; Middle ear problems Past Medical History Notes: hearing loss, uses hearing aids Respiratory Complaints and Symptoms: Negative for: Chronic or frequent coughs; Shortness of Breath Medical History: Positive for: Chronic Obstructive Pulmonary Disease (COPD) - mild Negative for: Aspiration; Asthma; Pneumothorax; Sleep Apnea; Tuberculosis Past Medical History Notes: pulmonary fibrosis, emphysema Cardiovascular Complaints and Symptoms: Negative for: Chest pain Medical History: Positive for: Arrhythmia - afib Gastrointestinal Complaints and Symptoms: Negative  for: Frequent diarrhea; Nausea; Vomiting Endocrine Complaints and Symptoms: Negative for: Heat/cold intolerance Medical History: Negative for: Type I Diabetes; Type II Diabetes Past Medical History Notes: hypothyroidism Genitourinary Complaints and  Symptoms: Negative for: Frequent urination Medical History: Negative for: End Stage Renal Disease Integumentary (Skin) Complaints and Symptoms: Positive for: Wounds - right lower leg Medical History: Negative for: History of Burn Musculoskeletal Complaints and Symptoms: Negative for: Muscle Pain; Muscle Weakness Neurologic Complaints and Symptoms: Negative for: Numbness/parasthesias Psychiatric Complaints and Symptoms: Positive for: Claustrophobia Negative for: Suicidal Medical History: Positive for: Confinement Anxiety Negative for: Anorexia/bulimia Hematologic/Lymphatic Medical History: Past Medical History Notes: hemochromatosis Immunological Medical History: Positive for: Raynauds; Scleroderma - lungs Negative for: Lupus Erythematosus Oncologic Medical History: Negative for: Received Chemotherapy; Received Radiation HBO Extended History Items Eyes: Cataracts Immunizations Pneumococcal Vaccine: Received Pneumococcal Vaccination: Yes Received Pneumococcal Vaccination On or After 60th Birthday: Yes Implantable Devices None Hospitalization / Surgery History Type of Hospitalization/Surgery mouth surgery left shouilder surgery total abdominal hysterectomy Family and Social History Cancer: No; Diabetes: Yes - Siblings; Heart Disease: Yes - Father; Hereditary Spherocytosis: No; Hypertension: Yes - Mother; Kidney Disease: Yes - Father; Lung Disease: No; Seizures: No; Stroke: Yes - Mother; Thyroid Problems: No; Tuberculosis: No; Former smoker - quit 25 yrs ago; Marital Status - Widowed; Alcohol Use: Moderate; Drug Use: No History; Caffeine Use: Daily - coffee; Financial Concerns: No; Food, Clothing or Shelter Needs: No; Support System Lacking: No; Transportation Concerns: No Engineer, maintenance) Signed: 03/03/2022 2:43:30 PM By: Fredirick Maudlin MD FACS Signed: 03/03/2022 5:28:23 PM By: Baruch Gouty RN, BSN Entered By: Baruch Gouty on 03/03/2022  13:52:09 -------------------------------------------------------------------------------- SuperBill Details Patient Name: Date of Service: [name], [name] 03/03/2022 Medical Record Number: [id number] Patient Account Number: [id number] Date of Birth/Sex: Treating RN: 03/03/1941 (81 y.o. [name] Primary Care Provider: Juline Patch Other Clinician: Referring Provider: Treating Provider/Extender: Manson Allan in Treatment: 0 Diagnosis Coding ICD-10 Codes Code Description [phone] Non-pressure chronic ulcer of other part of right lower leg with fat layer exposed I73.00 [name] syndrome without gangrene E83.110 Hereditary hemochromatosis J84.9 Interstitial pulmonary disease, unspecified J43.9 Emphysema, unspecified Z79.899 Other long term (current) drug therapy Facility Procedures CPT4 Code: [id number] Description: 99213 - WOUND CARE VISIT-LEV 3 EST PT Modifier: 25 Quantity: 1 Physician Procedures : CPT4 Code Description Modifier [id number] - WC PHYS LEVEL 4 - NEW PT 25 ICD-10 Diagnosis Description W80.321 Non-pressure chronic ulcer of other part of right lower leg with fat layer exposed Z79.899 Other long term (current) drug therapy I73.00  [name] syndrome without gangrene J84.9 Interstitial pulmonary disease, unspecified Quantity: 1 : [id number] - WC PHYS DEBR WO ANESTH 20 SQ CM ICD-10 Diagnosis Description U88.916 Non-pressure chronic ulcer of other part of right lower leg with fat layer exposed Quantity: 1 Electronic Signature(s) Signed: 03/03/2022 3:12:54 PM By: Fredirick Maudlin MD FACS Signed: 03/03/2022 5:28:23 PM By: Baruch Gouty RN, BSN Previous Signature: 03/03/2022 2:50:15 PM Version By: Fredirick Maudlin MD FACS Entered By: Baruch Gouty on 03/03/2022 14:59:53

## 2022-03-03 NOTE — Progress Notes (Signed)
[name], [name] ([id number]) Visit Report for 03/03/2022 Abuse Risk Screen Details Patient Name: Date of Service: [name], [name] 03/03/2022 1:30 PM Medical Record Number: [id number] Patient Account Number: [id number] Date of Birth/Sex: Treating RN: 1940/08/17 (81 y.o. [name] Primary Care Sloka Volante: Juline Patch Other Clinician: Referring Nevin Grizzle: Treating Kolbee Bogusz/Extender: Manson Allan in Treatment: 0 Abuse Risk Screen Items Answer ABUSE RISK SCREEN: Has anyone close to you tried to hurt or harm you recentlyo No Do you feel uncomfortable with anyone in your familyo No Has anyone forced you do things that you didnt want to doo No Electronic Signature(s) Signed: 03/03/2022 5:28:23 PM By: Baruch Gouty RN, BSN Entered By: Baruch Gouty on 03/03/2022 13:52:17 -------------------------------------------------------------------------------- Activities of Daily Living Details Patient Name: Date of Service: [name], [name] 03/03/2022 1:30 PM Medical Record Number: [id number] Patient Account Number: [id number] Date of Birth/Sex: Treating RN: 17-May-1941 (81 y.o. [name] Primary Care Zakiyyah Savannah: Juline Patch Other Clinician: Referring Davante Gerke: Treating Adelyn Roscher/Extender: Manson Allan in Treatment: 0 Activities of Daily Living Items Answer Activities of Daily Living (Please select one for each item) Drive Automobile Completely Able T Medications ake Completely Able Use T elephone Completely Able Care for Appearance Completely Able Use T oilet Completely Able Bath / Shower Completely Able Dress Self Completely Able Feed Self Completely Able Walk Completely Able Get In / Out Bed Completely Able Housework Completely Able Prepare Meals Completely Able Handle Money Completely Able Shop for Self Completely Able Electronic Signature(s) Signed: 03/03/2022 5:28:23 PM By: Baruch Gouty RN,  BSN Entered By: Baruch Gouty on 03/03/2022 13:52:50 -------------------------------------------------------------------------------- Education Screening Details Patient Name: Date of Service: [name], [name] 03/03/2022 1:30 PM Medical Record Number: [id number] Patient Account Number: [id number] Date of Birth/Sex: Treating RN: 1940/07/16 (81 y.o. [name] Primary Care Khoi Hamberger: Juline Patch Other Clinician: Referring Chiniqua Kilcrease: Treating Kalisi Bevill/Extender: Manson Allan in Treatment: 0 Primary Learner Assessed: Patient Learning Preferences/Education Level/Primary Language Learning Preference: Explanation, Demonstration, Printed Material Highest Education Level: High School Preferred Language: English Cognitive Barrier Language Barrier: No Translator Needed: No Memory Deficit: No Emotional Barrier: No Cultural/Religious Beliefs Affecting Medical Care: No Physical Barrier Impaired Vision: Yes Glasses Impaired Hearing: Yes Hearing Aid Decreased Hand dexterity: No Knowledge/Comprehension Knowledge Level: High Comprehension Level: High Ability to understand written instructions: High Ability to understand verbal instructions: High Motivation Anxiety Level: Calm Cooperation: Cooperative Education Importance: Acknowledges Need Interest in Health Problems: Asks Questions Perception: Coherent Willingness to Engage in Self-Management High Activities: Readiness to Engage in Self-Management High Activities: Electronic Signature(s) Signed: 03/03/2022 5:28:23 PM By: Baruch Gouty RN, BSN Entered By: Baruch Gouty on 03/03/2022 13:54:18 -------------------------------------------------------------------------------- Fall Risk Assessment Details Patient Name: Date of Service: [name], [name] 03/03/2022 1:30 PM Medical Record Number: [id number] Patient Account Number: [id number] Date of Birth/Sex: Treating RN: Mar 24, 1941 (81 y.o. [name] Primary Care Nikhil Osei: Juline Patch Other Clinician: Referring Shannara Winbush: Treating Danashia Landers/Extender: Manson Allan in Treatment: 0 Fall Risk Assessment Items Have you had 2 or more falls in the last 12 monthso 0 No Have you had any fall that resulted in injury in the last 12 monthso 0 Yes FALLS RISK SCREEN History of falling - immediate or within 3 months 25 Yes Secondary diagnosis (Do you have 2 or more medical diagnoseso) 0 No Ambulatory aid None/bed rest/wheelchair/nurse 0 Yes Crutches/cane/walker 0 No Furniture  0 No Intravenous therapy Access/Saline/Heparin Lock 0 No Gait/Transferring Normal/ bed rest/ wheelchair 0 Yes Weak (short steps with or without shuffle, stooped but able to lift head while walking, may seek 0 No support from furniture) Impaired (short steps with shuffle, may have difficulty arising from chair, head down, impaired 0 No balance) Mental Status Oriented to own ability 0 Yes Electronic Signature(s) Signed: 03/03/2022 5:28:23 PM By: Baruch Gouty RN, BSN Entered By: Baruch Gouty on 03/03/2022 13:54:35 -------------------------------------------------------------------------------- Foot Assessment Details Patient Name: Date of Service: [name], [name] 03/03/2022 1:30 PM Medical Record Number: [id number] Patient Account Number: [id number] Date of Birth/Sex: Treating RN: 1941-01-06 (81 y.o. [name] Primary Care Lorian Yaun: Juline Patch Other Clinician: Referring Glennon Kopko: Treating Ibrahem Volkman/Extender: Manson Allan in Treatment: 0 Foot Assessment Items Site Locations + = Sensation present, - = Sensation absent, C = Callus, U = Ulcer R = Redness, W = Warmth, M = Maceration, PU = Pre-ulcerative lesion F = Fissure, S = Swelling, D = Dryness Assessment Right: Left: Other Deformity: No No Prior Foot Ulcer: No No Prior Amputation: No No Charcot Joint: No No Ambulatory  Status: Ambulatory Without Help Gait: Steady Electronic Signature(s) Signed: 03/03/2022 5:28:23 PM By: Baruch Gouty RN, BSN Entered By: Baruch Gouty on 03/03/2022 13:55:49 -------------------------------------------------------------------------------- Nutrition Risk Screening Details Patient Name: Date of Service: [name], [name] 03/03/2022 1:30 PM Medical Record Number: [id number] Patient Account Number: [id number] Date of Birth/Sex: Treating RN: 03/02/1941 (81 y.o. [name] Primary Care Maytal Mijangos: Juline Patch Other Clinician: Referring Aron Needles: Treating Genice Kimberlin/Extender: Manson Allan in Treatment: 0 Height (in): 63 Weight (lbs): 142 Body Mass Index (BMI): 25.2 Nutrition Risk Screening Items Score Screening NUTRITION RISK SCREEN: I have an illness or condition that made me change the kind and/or amount of food I eat 0 No I eat fewer than two meals per day 0 No I eat few fruits and vegetables, or milk products 0 No I have three or more drinks of beer, liquor or wine almost every day 0 No I have tooth or mouth problems that make it hard for me to eat 0 No I don't always have enough money to buy the food I need 0 No I eat alone most of the time 0 No I take three or more different prescribed or over-the-counter drugs a day 1 Yes Without wanting to, I have lost or gained 10 pounds in the last six months 0 No I am not always physically able to shop, cook and/or feed myself 0 No Nutrition Protocols Good Risk Protocol 0 No interventions needed Moderate Risk Protocol High Risk Proctocol Risk Level: Good Risk Score: 1 Electronic Signature(s) Signed: 03/03/2022 5:28:23 PM By: Baruch Gouty RN, BSN Entered By: Baruch Gouty on 03/03/2022 13:55:37

## 2022-03-04 NOTE — Progress Notes (Signed)
[name], [name] ([id number]) Visit Report for 03/03/2022 Allergy List Details Patient Name: Date of Service: [name], [name] 03/03/2022 1:30 PM Medical Record Number: [id number] Patient Account Number: [id number] Date of Birth/Sex: Treating RN: 03-04-1941 (81 y.o. [name] Primary Care Juniper Cobey: Juline Patch Other Clinician: Referring Sophia Sperry: Treating Juanelle Trueheart/Extender: Theressa Stamps Weeks in Treatment: 0 Allergies Active Allergies No Known Allergies Allergy Notes Electronic Signature(s) Signed: 03/03/2022 5:28:23 PM By: Baruch Gouty RN, BSN Entered By: Baruch Gouty on 03/03/2022 13:41:53 -------------------------------------------------------------------------------- Arrival Information Details Patient Name: Date of Service: [name], [name] 03/03/2022 1:30 PM Medical Record Number: [id number] Patient Account Number: [id number] Date of Birth/Sex: Treating RN: 10-29-1940 (81 y.o. [name] Primary Care Shonita Rinck: Juline Patch Other Clinician: Referring Labrisha Wuellner: Treating Kaylum Shrum/Extender: Manson Allan in Treatment: 0 Visit Information Patient Arrived: Ambulatory Arrival Time: 13:38 Accompanied By: daughter Transfer Assistance: None Patient Identification Verified: Yes Secondary Verification Process Completed: Yes Patient Has Alerts: Yes Patient Alerts: Patient on Blood Thinner ABI RLE 1.07 Electronic Signature(s) Signed: 03/03/2022 4:37:16 PM By: Blanche East RN Entered By: Blanche East on 03/03/2022 16:09:53 -------------------------------------------------------------------------------- Clinic Level of Care Assessment Details Patient Name: Date of Service: [name], [name] 03/03/2022 1:30 PM Medical Record Number: [id number] Patient Account Number: [id number] Date of Birth/Sex: Treating RN: May 20, 1941 (81 y.o. [name] Primary Care Jonatha Gagen: Juline Patch Other  Clinician: Referring Allegra Cerniglia: Treating Marijah Larranaga/Extender: Manson Allan in Treatment: 0 Clinic Level of Care Assessment Items TOOL 1 Quantity Score X- 1 0 Use when EandM and Procedure is performed on INITIAL visit ASSESSMENTS - Nursing Assessment / Reassessment X- 1 20 General Physical Exam (combine w/ comprehensive assessment (listed just below) when performed on new pt. evals) X- 1 25 Comprehensive Assessment (HX, ROS, Risk Assessments, Wounds Hx, etc.) ASSESSMENTS - Wound and Skin Assessment / Reassessment X- 1 10 Dermatologic / Skin Assessment (not related to wound area) ASSESSMENTS - Ostomy and/or Continence Assessment and Care '[]'$  - 0 Incontinence Assessment and Management '[]'$  - 0 Ostomy Care Assessment and Management (repouching, etc.) PROCESS - Coordination of Care X - Simple Patient / Family Education for ongoing care 1 15 '[]'$  - 0 Complex (extensive) Patient / Family Education for ongoing care X- 1 10 Staff obtains Programmer, systems, Records, T Results / Process Orders est X- 1 10 Staff telephones HHA, Nursing Homes / Clarify orders / etc '[]'$  - 0 Routine Transfer to another Facility (non-emergent condition) '[]'$  - 0 Routine Hospital Admission (non-emergent condition) '[]'$  - 0 New Admissions / Biomedical engineer / Ordering NPWT Apligraf, etc. , '[]'$  - 0 Emergency Hospital Admission (emergent condition) PROCESS - Special Needs '[]'$  - 0 Pediatric / Minor Patient Management '[]'$  - 0 Isolation Patient Management '[]'$  - 0 Hearing / Language / Visual special needs '[]'$  - 0 Assessment of Community assistance (transportation, D/C planning, etc.) '[]'$  - 0 Additional assistance / Altered mentation '[]'$  - 0 Support Surface(s) Assessment (bed, cushion, seat, etc.) INTERVENTIONS - Miscellaneous '[]'$  - 0 External ear exam '[]'$  - 0 Patient Transfer (multiple staff / Civil Service fast streamer / Similar devices) '[]'$  - 0 Simple Staple / Suture removal (25 or less) '[]'$  - 0 Complex Staple  / Suture removal (26 or more) '[]'$  - 0 Hypo/Hyperglycemic Management (do not check if billed separately) X- 1 15 Ankle / Brachial Index (ABI) - do not check if billed separately Has the patient been seen at the hospital  within the last three years: Yes Total Score: 105 Level Of Care: New/Established - Level 3 Electronic Signature(s) Signed: 03/03/2022 5:28:23 PM By: Baruch Gouty RN, BSN Entered By: Baruch Gouty on 03/03/2022 14:59:42 -------------------------------------------------------------------------------- Lower Extremity Assessment Details Patient Name: Date of Service: [name], [name] 03/03/2022 1:30 PM Medical Record Number: [id number] Patient Account Number: [id number] Date of Birth/Sex: Treating RN: 17-May-1941 (81 y.o. [name] Primary Care Danyetta Gillham: Juline Patch Other Clinician: Referring Charie Pinkus: Treating Mahreen Schewe/Extender: Manson Allan in Treatment: 0 Edema Assessment Assessed: [Left: No] [Right: No] Edema: [Left: Ye] [Right: s] Calf Left: Right: Point of Measurement: From Medial Instep 32.5 cm Ankle Left: Right: Point of Measurement: From Medial Instep 20 cm Vascular Assessment Pulses: Dorsalis Pedis Palpable: [Right:Yes] Blood Pressure: Brachial: [Right:112] Dorsalis Pedis: 100 Ankle: Posterior Tibial: 120 Ankle Brachial Index: [Right:1.07] Electronic Signature(s) Signed: 03/03/2022 5:28:23 PM By: Baruch Gouty RN, BSN Entered By: Baruch Gouty on 03/03/2022 14:23:50 -------------------------------------------------------------------------------- Multi Wound Chart Details Patient Name: Date of Service: [name], [name] 03/03/2022 1:30 PM Medical Record Number: [id number] Patient Account Number: [id number] Date of Birth/Sex: Treating RN: 13-Nov-1940 (81 y.o. Harlow Ohms Primary Care Jazzma Neidhardt: Juline Patch Other Clinician: Referring Kristee Angus: Treating Kimberley Speece/Extender: Manson Allan in Treatment: 0 Vital Signs Height(in): 63 Pulse(bpm): 69 Weight(lbs): 142 Blood Pressure(mmHg): 112/72 Body Mass Index(BMI): 25.2 Temperature(F): 97.9 Respiratory Rate(breaths/min): 18 Photos: [1:Right, Anterior Lower Leg] [N/A:N/A N/A] Wound Location: [1:Trauma] [N/A:N/A] Wounding Event: [1:Skin T ear] [N/A:N/A] Primary Etiology: [1:Cataracts, Chronic Obstructive] [N/A:N/A] Comorbid History: [1:Pulmonary Disease (COPD), Arrhythmia, Raynauds, Scleroderma, Confinement Anxiety 02/17/2022] [N/A:N/A] Date Acquired: [1:0] [N/A:N/A] Weeks of Treatment: [1:Open] [N/A:N/A] Wound Status: [1:No] [N/A:N/A] Wound Recurrence: [1:9x5x0.1] [N/A:N/A] Measurements L x W x D (cm) [1:35.343] [N/A:N/A] A (cm) : rea [1:3.534] [N/A:N/A] Volume (cm) : [1:0.00%] [N/A:N/A] % Reduction in A [1:rea: 0.00%] [N/A:N/A] % Reduction in Volume: [1:Full Thickness Without Exposed] [N/A:N/A] Classification: [1:Support Structures Medium] [N/A:N/A] Exudate A mount: [1:Serosanguineous] [N/A:N/A] Exudate Type: [1:red, brown] [N/A:N/A] Exudate Color: [1:Flat and Intact] [N/A:N/A] Wound Margin: [1:Small (1-33%)] [N/A:N/A] Granulation A mount: [1:Red] [N/A:N/A] Granulation Quality: [1:Large (67-100%)] [N/A:N/A] Necrotic A mount: [1:Eschar, Adherent Slough] [N/A:N/A] Necrotic Tissue: [1:Fat Layer (Subcutaneous Tissue): Yes N/A] Exposed Structures: [1:Fascia: No Tendon: No Muscle: No Joint: No Bone: No Large (67-100%)] [N/A:N/A] Epithelialization: [1:Debridement - Selective/Open Wound N/A] Debridement: Pre-procedure Verification/Time Out 14:26 [N/A:N/A] Taken: [1:Lidocaine 5% topical ointment] [N/A:N/A] Pain Control: [1:Necrotic/Eschar] [N/A:N/A] Tissue Debrided: [1:Non-Viable Tissue] [N/A:N/A] Level: [1:1] [N/A:N/A] Debridement A (sq cm): [1:rea Curette] [N/A:N/A] Instrument: [1:Minimum] [N/A:N/A] Bleeding: [1:Pressure] [N/A:N/A] Hemostasis A chieved: [1:0] [N/A:N/A] Procedural  Pain: [1:0] [N/A:N/A] Post Procedural Pain: [1:Procedure was tolerated well] [N/A:N/A] Debridement Treatment Response: [1:9x5x0.1] [N/A:N/A] Post Debridement Measurements L x W x D (cm) [1:3.534] [N/A:N/A] Post Debridement Volume: (cm) [1:Debridement] [N/A:N/A] Treatment Notes Electronic Signature(s) Signed: 03/03/2022 2:45:37 PM By: Fredirick Maudlin MD FACS Signed: 03/04/2022 5:20:05 PM By: Adline Peals Entered By: Fredirick Maudlin on 03/03/2022 14:45:37 -------------------------------------------------------------------------------- Multi-Disciplinary Care Plan Details Patient Name: Date of Service: [name], [name] 03/03/2022 1:30 PM Medical Record Number: [id number] Patient Account Number: [id number] Date of Birth/Sex: Treating RN: August 05, 1940 (81 y.o. [name] Primary Care Kirston Luty: Juline Patch Other Clinician: Referring Cleaven Demario: Treating Jahmar Mckelvy/Extender: Manson Allan in Treatment: 0 Active Inactive Abuse / Safety / Falls / Self Care Management Nursing Diagnoses: Potential for injury related to falls Goals: Patient will remain injury free related to falls Date Initiated:  03/03/2022 Target Resolution Date: 03/31/2022 Goal Status: Active Interventions: Provide education on fall prevention Notes: Orientation to the Wound Care Program Nursing Diagnoses: Knowledge deficit related to the wound healing center program Goals: Patient/caregiver will verbalize understanding of the [place] Program Date Initiated: 03/03/2022 Target Resolution Date: 03/31/2022 Goal Status: Active Interventions: Provide education on orientation to the wound center Notes: Wound/Skin Impairment Nursing Diagnoses: Impaired tissue integrity Goals: Ulcer/skin breakdown will have a volume reduction of 30% by week 4 Date Initiated: 03/03/2022 Target Resolution Date: 03/31/2022 Goal Status: Active Interventions: Assess patient/caregiver ability  to obtain necessary supplies Assess ulceration(s) every visit Treatment Activities: Skin care regimen initiated : 03/03/2022 Notes: Electronic Signature(s) Signed: 03/03/2022 5:28:23 PM By: Baruch Gouty RN, BSN Entered By: Baruch Gouty on 03/03/2022 14:30:08 -------------------------------------------------------------------------------- Pain Assessment Details Patient Name: Date of Service: [name], [name] 03/03/2022 1:30 PM Medical Record Number: [id number] Patient Account Number: [id number] Date of Birth/Sex: Treating RN: 1941/05/05 (81 y.o. [name] Primary Care Aliyyah Riese: Juline Patch Other Clinician: Referring Clarabell Matsuoka: Treating Analeigh Aries/Extender: Manson Allan in Treatment: 0 Active Problems Location of Pain Severity and Description of Pain Patient Has Paino Yes Patient Has Paino Yes Site Locations Pain Location: Pain in Ulcers With Dressing Change: Yes Duration of the Pain. Constant / Intermittento Intermittent Rate the pain. Current Pain Level: 6 Worst Pain Level: 7 Least Pain Level: 2 Character of Pain Describe the Pain: Aching, Tender Pain Management and Medication Current Pain Management: Medication: Yes Is the Current Pain Management Adequate: Adequate Rest: Yes How does your wound impact your activities of daily livingo Sleep: Yes Bathing: No Appetite: No Relationship With Others: No Bladder Continence: No Emotions: No Bowel Continence: No Work: No Toileting: No Drive: No Dressing: No Hobbies: No Electronic Signature(s) Signed: 03/03/2022 5:28:23 PM By: Baruch Gouty RN, BSN Entered By: Baruch Gouty on 03/03/2022 14:05:49 -------------------------------------------------------------------------------- Patient/Caregiver Education Details Patient Name: Date of Service: [name], PA [place] 9/7/2023andnbsp1:30 PM Medical Record Number: [id number] Patient Account Number: [id number] Date of  Birth/Gender: Treating RN: Jun 22, 1941 (80 y.o. [name] Primary Care Physician: Juline Patch Other Clinician: Referring Physician: Treating Physician/Extender: Manson Allan in Treatment: 0 Education Assessment Education Provided To: Patient Education Topics Provided Safety: Methods: Explain/Verbal Responses: Reinforcements needed, State content correctly [place]: o Handouts: Welcome T [facility] [place] o Methods: Explain/Verbal Responses: Reinforcements needed, State content correctly Wound/Skin Impairment: Handouts: Skin Care Do's and Dont's Methods: Explain/Verbal Responses: Reinforcements needed, State content correctly Electronic Signature(s) Signed: 03/03/2022 5:28:23 PM By: Baruch Gouty RN, BSN Entered By: Baruch Gouty on 03/03/2022 14:30:56 -------------------------------------------------------------------------------- Wound Assessment Details Patient Name: Date of Service: [name], [name] 03/03/2022 1:30 PM Medical Record Number: [id number] Patient Account Number: [id number] Date of Birth/Sex: Treating RN: Jul 23, 1940 (81 y.o. [name] Primary Care Bryana Froemming: Juline Patch Other Clinician: Referring Daylee Delahoz: Treating Rhodesia Stanger/Extender: Manson Allan in Treatment: 0 Wound Status Wound Number: 1 Primary Skin T ear Etiology: Wound Location: Right, Anterior Lower Leg Wound Open Wounding Event: Trauma Status: Date Acquired: 02/17/2022 Comorbid Cataracts, Chronic Obstructive Pulmonary Disease (COPD), Weeks Of Treatment: 0 History: Arrhythmia, Raynauds, Scleroderma, Confinement Anxiety Clustered Wound: No Photos Wound Measurements Length: (cm) 9 Width: (cm) 5 Depth: (cm) 0.1 Area: (cm) 35.343 Volume: (cm) 3.534 % Reduction in Area: 0% % Reduction in Volume: 0% Epithelialization: Large (67-100%) Tunneling: No Undermining: No Wound  Description Classification:  Full Thickness Without Exposed Support Structures Wound Margin: Flat and Intact Exudate Amount: Medium Exudate Type: Serosanguineous Exudate Color: red, brown Foul Odor After Cleansing: No Slough/Fibrino Yes Wound Bed Granulation Amount: Small (1-33%) Exposed Structure Granulation Quality: Red Fascia Exposed: No Necrotic Amount: Large (67-100%) Fat Layer (Subcutaneous Tissue) Exposed: Yes Necrotic Quality: Eschar, Adherent Slough Tendon Exposed: No Muscle Exposed: No Joint Exposed: No Bone Exposed: No Treatment Notes Wound #1 (Lower Leg) Wound Laterality: Right, Anterior Cleanser Soap and Water Discharge Instruction: May shower and wash wound with dial antibacterial soap and water prior to dressing change. Peri-Wound Care Sween Lotion (Moisturizing lotion) Discharge Instruction: Apply moisturizing lotion as directed Topical Primary Dressing KerraCel Ag Gelling Fiber Dressing, 4x5 in (silver alginate) Discharge Instruction: Apply silver alginate to wound bed as instructed Secondary Dressing T Non-Adherent Dressing, 3x4 in elfa Discharge Instruction: Apply over primary dressing as directed. Zetuvit Plus 4x8 in Discharge Instruction: Apply over primary dressing as directed. Secured With [employer] 4x5 (in/yd) Discharge Instruction: Secure with Coban as directed. Kerlix Roll Sterile, 4.5x3.1 (in/yd) Discharge Instruction: Secure with Kerlix as directed. 47M Medipore H Soft Cloth Surgical T ape, 4 x 10 (in/yd) Discharge Instruction: Secure with tape as directed. Compression Wrap Compression Stockings Add-Ons Electronic Signature(s) Signed: 03/03/2022 4:37:16 PM By: Blanche East RN Signed: 03/03/2022 5:28:23 PM By: Baruch Gouty RN, BSN Entered By: Blanche East on 03/03/2022 14:07:50 -------------------------------------------------------------------------------- Vitals Details Patient Name: Date of Service: [name], PA TRICIA  J. 03/03/2022 1:30 PM Medical Record Number: [id number] Patient Account Number: [id number] Date of Birth/Sex: Treating RN: [birth year] (81 y.o. [name] Primary Care Venus Gilles: Juline Patch Other Clinician: Referring Boniface Goffe: Treating Chuck Caban/Extender: Manson Allan in Treatment: 0 Vital Signs Time Taken: 13:39 Temperature (F): 97.9 Height (in): 63 Pulse (bpm): 69 Source: Stated Respiratory Rate (breaths/min): 18 Weight (lbs): 142 Blood Pressure (mmHg): 112/72 Source: Stated Reference Range: 80 - 120 mg / dl Body Mass Index (BMI): 25.2 Electronic Signature(s) Signed: 03/03/2022 5:28:23 PM By: Baruch Gouty RN, BSN Entered By: Baruch Gouty on 03/03/2022 13:41:40

## 2022-03-08 ENCOUNTER — Telehealth: Payer: Self-pay | Admitting: *Deleted

## 2022-03-08 DIAGNOSIS — R0683 Snoring: Secondary | ICD-10-CM

## 2022-03-08 DIAGNOSIS — R400 Somnolence: Secondary | ICD-10-CM

## 2022-03-08 NOTE — Telephone Encounter (Signed)
The patient has been notified of the result. Left detailed message on voicemail and informed patient to call back..Jamorion Gomillion Green, CMA   

## 2022-03-08 NOTE — Telephone Encounter (Signed)
-----   Message from Lauralee Evener, [place] sent at 02/08/2022 10:50 AM EDT -----  ----- Message ----- From: Sueanne Margarita, MD Sent: 02/08/2022   9:53 AM EDT To: [facility] Sleep Studies  Please let patient know that they have sleep apnea.  Recommend therapeutic CPAP titration for treatment of patient's sleep disordered breathing.  If unable to perform an in lab titration then initiate ResMed auto CPAP from 4 to 15cm H2O with heated humidity and mask of choice and overnight pulse ox on CPAP.

## 2022-03-09 ENCOUNTER — Encounter (HOSPITAL_BASED_OUTPATIENT_CLINIC_OR_DEPARTMENT_OTHER): Payer: Medicare Other | Admitting: General Surgery

## 2022-03-09 DIAGNOSIS — L97812 Non-pressure chronic ulcer of other part of right lower leg with fat layer exposed: Secondary | ICD-10-CM | POA: Diagnosis not present

## 2022-03-09 NOTE — Progress Notes (Signed)
[name], GRADEL ([id number]) Visit Report for 03/09/2022 Chief Complaint Document Details Patient Name: Date of Service: [name], [name] 03/09/2022 2:30 PM Medical Record Number: [id number] Patient Account Number: [id number] Date of Birth/Sex: Treating RN: [birth year] (81 y.o. F) Primary Care Provider: Juline Patch Other Clinician: Referring Provider: Treating Provider/Extender: Manson Allan in Treatment: 0 Information Obtained from: Patient Chief Complaint Patient seen for complaints of Non-Healing Wound. Electronic Signature(s) Signed: 03/09/2022 3:14:06 PM By: Fredirick Maudlin MD FACS Entered By: Fredirick Maudlin on 03/09/2022 15:14:05 -------------------------------------------------------------------------------- Debridement Details Patient Name: Date of Service: [name], [name] 03/09/2022 2:30 PM Medical Record Number: [id number] Patient Account Number: [id number] Date of Birth/Sex: Treating RN: 1941/06/25 (81 y.o. Iver [name], Tara Primary Care Provider: Juline Patch Other Clinician: Referring Provider: Treating Provider/Extender: Manson Allan in Treatment: 0 Debridement Performed for Assessment: Wound #1 Right,Anterior Lower Leg Performed By: Physician Fredirick Maudlin, MD Debridement Type: Debridement Level of Consciousness (Pre-procedure): Awake and Alert Pre-procedure Verification/Time Out Yes - 15:00 Taken: Start Time: 15:01 T Area Debrided (L x W): otal 2 (cm) x 2 (cm) = 4 (cm) Tissue and other material debrided: Non-Viable, Slough, Slough Level: Non-Viable Tissue Debridement Description: Selective/Open Wound Instrument: Curette Bleeding: Minimum Hemostasis Achieved: Pressure Procedural Pain: 0 Post Procedural Pain: 0 Response to Treatment: Procedure was tolerated well Level of Consciousness (Post- Awake and Alert procedure): Post Debridement Measurements of Total Wound Length: (cm)  12 Width: (cm) 8 Depth: (cm) 0.1 Volume: (cm) 7.54 Character of Wound/Ulcer Post Debridement: Improved Post Procedure Diagnosis Same as Pre-procedure Notes Scribed for Dr. Celine Ahr by Blanche East, RN Electronic Signature(s) Signed: 03/09/2022 4:11:53 PM By: Fredirick Maudlin MD FACS Signed: 03/09/2022 4:45:05 PM By: Blanche East RN Previous Signature: 03/09/2022 3:28:39 PM Version By: Fredirick Maudlin MD FACS Entered By: Blanche East on 03/09/2022 16:06:12 -------------------------------------------------------------------------------- HPI Details Patient Name: Date of Service: [name], [name] 03/09/2022 2:30 PM Medical Record Number: [id number] Patient Account Number: [id number] Date of Birth/Sex: Treating RN: 1941-02-03 (81 y.o. F) Primary Care Provider: Juline Patch Other Clinician: Referring Provider: Treating Provider/Extender: Manson Allan in Treatment: 0 History of Present Illness HPI Description: ADMISSION 03/03/2022 This is an 81 year old woman with a past medical history significant for scleroderma, interstitial lung disease, [name] syndrome, chronic steroid use, hereditary hemochromatosis cytosis and emphysema. She is chronically on corticosteroids as well as mycophenolate. She was visiting her brother in [place] when she fell walking up the steps to the house. She suffered a laceration to her right anterior lower leg. She was seen at a local healthcare facility. The wound was dressed and she was discharged. She has returned to [place] and was referred to the wound care center for further evaluation and management. She and her daughter have been caring for it at home. They have been applying a nonadherent gauze over the wound and wrapping it with Kerlix and Coban. There is a U-shaped wound on the patient's anterior tibial surface. The wound edges are actually secured with Steri-Strips and there is no exposed tissue. There does appear  to have been some serous drainage that has crusted around the edges of the wound but there is no sign or concern for infection. Just proximal to the main wound, she has a smaller abrasion that does get down into the fat layer. There is a little bit of slough and eschar present at this site. 03/09/2022: Much of the  wound has healed. The Steri-Strips were removed to reveal just a small open portion at the most distal part of the wound. There is slough accumulation. No concern for infection. Electronic Signature(s) Signed: 03/09/2022 3:14:43 PM By: Fredirick Maudlin MD FACS Entered By: Fredirick Maudlin on 03/09/2022 15:14:43 -------------------------------------------------------------------------------- Physical Exam Details Patient Name: Date of Service: [name], [name] 03/09/2022 2:30 PM Medical Record Number: [id number] Patient Account Number: [id number] Date of Birth/Sex: Treating RN: September 10, 1940 (81 y.o. F) Primary Care Provider: Juline Patch Other Clinician: Referring Provider: Treating Provider/Extender: Manson Allan in Treatment: 0 Constitutional . . . . No acute distress.Marland Kitchen Respiratory Normal work of breathing on room air.. Notes 03/09/2022: Much of the wound has healed. The Steri-Strips were removed to reveal just a small open portion at the most distal part of the wound. There is slough accumulation. No concern for infection. Electronic Signature(s) Signed: 03/09/2022 3:17:39 PM By: Fredirick Maudlin MD FACS Entered By: Fredirick Maudlin on 03/09/2022 15:17:39 -------------------------------------------------------------------------------- Physician Orders Details Patient Name: Date of Service: [name], [name] 03/09/2022 2:30 PM Medical Record Number: [id number] Patient Account Number: [id number] Date of Birth/Sex: Treating RN: 11/25/1940 (81 y.o. Iver [name], Tara Primary Care Provider: Juline Patch Other Clinician: Referring  Provider: Treating Provider/Extender: Manson Allan in Treatment: 0 Verbal / Phone Orders: No Diagnosis Coding ICD-10 Coding Code Description [phone] Non-pressure chronic ulcer of other part of right lower leg with fat layer exposed I73.00 [name] syndrome without gangrene E83.110 Hereditary hemochromatosis J84.9 Interstitial pulmonary disease, unspecified J43.9 Emphysema, unspecified Z79.899 Other long term (current) drug therapy Follow-up Appointments ppointment in 1 week. - Dr. Celine Ahr Rm 3 Return A Tuesday 03/15/22 at 12:30 PM Bathing/ Shower/ Hygiene May shower with protection but do not get wound dressing(s) wet. - Wear cast protector, [facility] Wound Treatment Wound #1 - Lower Leg Wound Laterality: Right, Anterior Cleanser: Soap and Water Discharge Instructions: May shower and wash wound with dial antibacterial soap and water prior to dressing change. Peri-Wound Care: Sween Lotion (Moisturizing lotion) Discharge Instructions: Apply moisturizing lotion as directed Prim Dressing: KerraCel Ag Gelling Fiber Dressing, 4x5 in (silver alginate) ary Discharge Instructions: Apply silver alginate to wound bed as instructed Secondary Dressing: T Non-Adherent Dressing, 3x4 in elfa Discharge Instructions: Apply over primary dressing as directed. Secondary Dressing: Zetuvit Plus 4x8 in Discharge Instructions: Apply over primary dressing as directed. Secured With: Coban Self-Adherent Wrap 4x5 (in/yd) Discharge Instructions: Secure with Coban as directed. Secured With: [employer], 4.5x3.1 (in/yd) Discharge Instructions: Secure with Kerlix as directed. Secured With: 57M Medipore H Soft Cloth Surgical T ape, 4 x 10 (in/yd) Discharge Instructions: Secure with tape as directed. Electronic Signature(s) Signed: 03/09/2022 4:11:53 PM By: Fredirick Maudlin MD FACS Signed: 03/09/2022 4:45:05 PM By: Blanche East RN Previous Signature: 03/09/2022 3:17:55 PM  Version By: Fredirick Maudlin MD FACS Previous Signature: 03/09/2022 2:52:30 PM Version By: Fredirick Maudlin MD FACS Entered By: Blanche East on 03/09/2022 16:07:08 -------------------------------------------------------------------------------- Problem List Details Patient Name: Date of Service: [name], [name] 03/09/2022 2:30 PM Medical Record Number: [id number] Patient Account Number: [id number] Date of Birth/Sex: Treating RN: 1941/01/08 (81 y.o. F) Primary Care Provider: Juline Patch Other Clinician: Referring Provider: Treating Provider/Extender: Manson Allan in Treatment: 0 Active Problems ICD-10 Encounter Code Description Active Date MDM Diagnosis L97.812 Non-pressure chronic ulcer of other part of right lower leg with fat layer 03/03/2022 No Yes exposed I73.00  [name] syndrome without gangrene 03/03/2022 No Yes E83.110 Hereditary hemochromatosis 03/03/2022 No Yes J84.9 Interstitial pulmonary disease, unspecified 03/03/2022 No Yes J43.9 Emphysema, unspecified 03/03/2022 No Yes Z79.899 Other long term (current) drug therapy 03/03/2022 No Yes Inactive Problems Resolved Problems Electronic Signature(s) Signed: 03/09/2022 3:13:55 PM By: Fredirick Maudlin MD FACS Entered By: Fredirick Maudlin on 03/09/2022 15:13:55 -------------------------------------------------------------------------------- Progress Note Details Patient Name: Date of Service: [name], [name] 03/09/2022 2:30 PM Medical Record Number: [id number] Patient Account Number: [id number] Date of Birth/Sex: Treating RN: 1940-09-28 (81 y.o. F) Primary Care Provider: Juline Patch Other Clinician: Referring Provider: Treating Provider/Extender: Manson Allan in Treatment: 0 Subjective Chief Complaint Information obtained from Patient Patient seen for complaints of Non-Healing Wound. History of Present Illness (HPI) ADMISSION 03/03/2022 This is an  81 year old woman with a past medical history significant for scleroderma, interstitial lung disease, [name] syndrome, chronic steroid use, hereditary hemochromatosis cytosis and emphysema. She is chronically on corticosteroids as well as mycophenolate. She was visiting her brother in [place] when she fell walking up the steps to the house. She suffered a laceration to her right anterior lower leg. She was seen at a local healthcare facility. The wound was dressed and she was discharged. She has returned to [place] and was referred to the wound care center for further evaluation and management. She and her daughter have been caring for it at home. They have been applying a nonadherent gauze over the wound and wrapping it with Kerlix and Coban. There is a U-shaped wound on the patient's anterior tibial surface. The wound edges are actually secured with Steri-Strips and there is no exposed tissue. There does appear to have been some serous drainage that has crusted around the edges of the wound but there is no sign or concern for infection. Just proximal to the main wound, she has a smaller abrasion that does get down into the fat layer. There is a little bit of slough and eschar present at this site. 03/09/2022: Much of the wound has healed. The Steri-Strips were removed to reveal just a small open portion at the most distal part of the wound. There is slough accumulation. No concern for infection. Patient History Information obtained from Patient. Family History Diabetes - Siblings, Heart Disease - Father, Hypertension - Mother, Kidney Disease - Father, Stroke - Mother, No family history of Cancer, Hereditary Spherocytosis, Lung Disease, Seizures, Thyroid Problems, Tuberculosis. Social History Former smoker - quit 25 yrs ago, Marital Status - Widowed, Alcohol Use - Moderate, Drug Use - No History, Caffeine Use - Daily - coffee. Medical History Eyes Patient has history of Cataracts - bil  removed Ear/Nose/Mouth/Throat Denies history of Chronic sinus problems/congestion, Middle ear problems Respiratory Patient has history of Chronic Obstructive Pulmonary Disease (COPD) - mild Denies history of Aspiration, Asthma, Pneumothorax, Sleep Apnea, Tuberculosis Cardiovascular Patient has history of Arrhythmia - afib Endocrine Denies history of Type I Diabetes, Type II Diabetes Genitourinary Denies history of End Stage Renal Disease Immunological Patient has history of Raynaudoos, Scleroderma - lungs Denies history of Lupus Erythematosus Integumentary (Skin) Denies history of History of Burn Oncologic Denies history of Received Chemotherapy, Received Radiation Psychiatric Patient has history of Confinement Anxiety Denies history of Anorexia/bulimia Hospitalization/Surgery History - mouth surgery. - left shouilder surgery. - total abdominal hysterectomy. Medical A Surgical History Notes nd Ear/Nose/Mouth/Throat hearing loss, uses hearing aids Hematologic/Lymphatic hemochromatosis Respiratory pulmonary fibrosis, emphysema Endocrine hypothyroidism Objective Constitutional No acute distress.. Vitals Time Taken: 2:48 PM, Height:  63 in, Weight: 142 lbs, BMI: 25.2, Temperature: 98.1 F, Pulse: 61 bpm, Respiratory Rate: 18 breaths/min, Blood Pressure: 109/64 mmHg. Respiratory Normal work of breathing on room air.. General Notes: 03/09/2022: Much of the wound has healed. The Steri-Strips were removed to reveal just a small open portion at the most distal part of the wound. There is slough accumulation. No concern for infection. Integumentary (Hair, Skin) Wound #1 status is Open. Original cause of wound was Trauma. The date acquired was: 02/17/2022. The wound is located on the Right,Anterior Lower Leg. The wound measures 12cm length x 8cm width x 0.1cm depth; 75.398cm^2 area and 7.54cm^3 volume. There is Fat Layer (Subcutaneous Tissue) exposed. There is a medium amount of  serosanguineous drainage noted. The wound margin is flat and intact. There is small (1-33%) red granulation within the wound bed. There is a large (67-100%) amount of necrotic tissue within the wound bed including Eschar and Adherent Slough. Assessment Active Problems ICD-10 Non-pressure chronic ulcer of other part of right lower leg with fat layer exposed [name] syndrome without gangrene Hereditary hemochromatosis Interstitial pulmonary disease, unspecified Emphysema, unspecified Other long term (current) drug therapy Procedures Wound #1 Pre-procedure diagnosis of Wound #1 is a Skin T located on the Right,Anterior Lower Leg . There was a Selective/Open Wound Non-Viable Tissue ear Debridement with a total area of 4 sq cm performed by Fredirick Maudlin, MD. With the following instrument(s): Curette to remove Non-Viable tissue/material. Material removed includes [facility]. No specimens were taken. A time out was conducted at 15:00, prior to the start of the procedure. A Minimum amount of bleeding was controlled with Pressure. The procedure was tolerated well with a pain level of 0 throughout and a pain level of 0 following the procedure. Post Debridement Measurements: 12cm length x 8cm width x 0.1cm depth; 7.54cm^3 volume. Character of Wound/Ulcer Post Debridement is improved. Post procedure Diagnosis Wound #1: Same as Pre-Procedure Plan Follow-up Appointments: Return Appointment in 1 week. - Dr. Celine Ahr Rm 3 Tuesday 03/15/22 at 12:30 PM Anesthetic: (In clinic) Topical Lidocaine 5% applied to wound bed - In clinic, prior to Debridement Bathing/ Shower/ Hygiene: May shower with protection but do not get wound dressing(s) wet. - W[facility] cast protector, [facility] WOUND #1: - Lower Leg Wound Laterality: Right, Anterior Cleanser: Soap and Water Discharge Instructions: May shower and wash wound with dial antibacterial soap and water prior to dressing change. Peri-Wound Care: Sween Lotion  (Moisturizing lotion) Discharge Instructions: Apply moisturizing lotion as directed Prim Dressing: KerraCel Ag Gelling Fiber Dressing, 4x5 in (silver alginate) ary Discharge Instructions: Apply silver alginate to wound bed as instructed Secondary Dressing: T Non-Adherent Dressing, 3x4 in elfa Discharge Instructions: Apply over primary dressing as directed. Secondary Dressing: Zetuvit Plus 4x8 in Discharge Instructions: Apply over primary dressing as directed. Secured With: Coban Self-Adherent Wrap 4x5 (in/yd) Discharge Instructions: Secure with Coban as directed. Secured With: [employer], 4.5x3.1 (in/yd) Discharge Instructions: Secure with Kerlix as directed. Secured With: 86M Medipore H Soft Cloth Surgical T ape, 4 x 10 (in/yd) Discharge Instructions: Secure with tape as directed. 03/09/2022: Much of the wound has healed. The Steri-Strips were removed to reveal just a small open portion at the most distal part of the wound. There is slough accumulation. No concern for infection. I used a curette to debride the slough from the wound. We will continue to use silver alginate with Kerlix and Coban wrapping. Follow-up in 1 week. Electronic Signature(s) Signed: 03/09/2022 3:18:19 PM By: Fredirick Maudlin MD FACS Signed:  03/09/2022 3:18:19 PM By: Fredirick Maudlin MD FACS Entered By: Fredirick Maudlin on 03/09/2022 15:18:19 -------------------------------------------------------------------------------- HxROS Details Patient Name: Date of Service: [name], [name] 03/09/2022 2:30 PM Medical Record Number: [id number] Patient Account Number: [id number] Date of Birth/Sex: Treating RN: [birth year] (81 y.o. F) Primary Care Provider: Juline Patch Other Clinician: Referring Provider: Treating Provider/Extender: Manson Allan in Treatment: 0 Information Obtained From Patient Eyes Medical History: Positive for: Cataracts - bil  removed Ear/Nose/Mouth/Throat Medical History: Negative for: Chronic sinus problems/congestion; Middle ear problems Past Medical History Notes: hearing loss, uses hearing aids Hematologic/Lymphatic Medical History: Past Medical History Notes: hemochromatosis Respiratory Medical History: Positive for: Chronic Obstructive Pulmonary Disease (COPD) - mild Negative for: Aspiration; Asthma; Pneumothorax; Sleep Apnea; Tuberculosis Past Medical History Notes: pulmonary fibrosis, emphysema Cardiovascular Medical History: Positive for: Arrhythmia - afib Endocrine Medical History: Negative for: Type I Diabetes; Type II Diabetes Past Medical History Notes: hypothyroidism Genitourinary Medical History: Negative for: End Stage Renal Disease Immunological Medical History: Positive for: Raynauds; Scleroderma - lungs Negative for: Lupus Erythematosus Integumentary (Skin) Medical History: Negative for: History of Burn Oncologic Medical History: Negative for: Received Chemotherapy; Received Radiation Psychiatric Medical History: Positive for: Confinement Anxiety Negative for: Anorexia/bulimia HBO Extended History Items Eyes: Cataracts Immunizations Pneumococcal Vaccine: Received Pneumococcal Vaccination: Yes Received Pneumococcal Vaccination On or After 60th Birthday: Yes Implantable Devices None Hospitalization / Surgery History Type of Hospitalization/Surgery mouth surgery left shouilder surgery total abdominal hysterectomy Family and Social History Cancer: No; Diabetes: Yes - Siblings; Heart Disease: Yes - Father; Hereditary Spherocytosis: No; Hypertension: Yes - Mother; Kidney Disease: Yes - Father; Lung Disease: No; Seizures: No; Stroke: Yes - Mother; Thyroid Problems: No; Tuberculosis: No; Former smoker - quit 25 yrs ago; Marital Status - Widowed; Alcohol Use: Moderate; Drug Use: No History; Caffeine Use: Daily - coffee; Financial Concerns: No; Food, Clothing or Shelter  Needs: No; Support System Lacking: No; Transportation Concerns: No Electronic Signature(s) Signed: 03/09/2022 3:28:39 PM By: Fredirick Maudlin MD FACS Entered By: Fredirick Maudlin on 03/09/2022 15:17:10 -------------------------------------------------------------------------------- SuperBill Details Patient Name: Date of Service: [name], [name] 03/09/2022 Medical Record Number: [id number] Patient Account Number: [id number] Date of Birth/Sex: Treating RN: 19-Jul-1940 (81 y.o. F) Primary Care Provider: Juline Patch Other Clinician: Referring Provider: Treating Provider/Extender: Manson Allan in Treatment: 0 Diagnosis Coding ICD-10 Codes Code Description [phone] Non-pressure chronic ulcer of other part of right lower leg with fat layer exposed I73.00 [name] syndrome without gangrene E83.110 Hereditary hemochromatosis J84.9 Interstitial pulmonary disease, unspecified J43.9 Emphysema, unspecified Z79.899 Other long term (current) drug therapy Facility Procedures Physician Procedures : CPT4 Code Description Modifier [id number] - WC PHYS LEVEL 3 - EST PT 25 ICD-10 Diagnosis Description L97.812 Non-pressure chronic ulcer of other part of right lower leg with fat layer exposed I73.00 [name] syndrome without gangrene Z79.899 Other  long term (current) drug therapy J84.9 Interstitial pulmonary disease, unspecified Quantity: 1 : [id number] - WC PHYS DEBR WO ANESTH 20 SQ CM ICD-10 Diagnosis Description E93.810 Non-pressure chronic ulcer of other part of right lower leg with fat layer exposed Quantity: 1 Electronic Signature(s) Signed: 03/09/2022 3:18:37 PM By: Fredirick Maudlin MD FACS Entered By: Fredirick Maudlin on 03/09/2022 15:18:37

## 2022-03-09 NOTE — Progress Notes (Signed)
JORGE, AMPARO ([id number]) Visit Report for 03/09/2022 Arrival Information Details Patient Name: Date of Service: [name], [place] [name] 03/09/2022 2:30 PM Medical Record Number: [id number] Patient Account Number: [id number] Date of Birth/Sex: Treating RN: February 23, 1941 (81 y.o. [name], [name] Primary Care Rayen Dafoe: Juline Patch Other Clinician: Referring Teletha Petrea: Treating Shuntel Fishburn/Extender: Manson Allan in Treatment: 0 Visit Information History Since Last Visit All ordered tests and consults were completed: Yes Patient Arrived: Ambulatory Added or deleted any medications: No Arrival Time: 14:48 Any new allergies or adverse reactions: No Accompanied By: daughter Had a fall or experienced change in No Transfer Assistance: None activities of daily living that may affect Patient Has Alerts: Yes risk of falls: Patient Alerts: Patient on Blood Thinner Signs or symptoms of abuse/neglect since last visito No ABI RLE 1.07 Hospitalized since last visit: No Implantable device outside of the clinic excluding No cellular tissue based products placed in the center since last visit: Has Dressing in Place as Prescribed: Yes Has Compression in Place as Prescribed: Yes Pain Present Now: No Electronic Signature(s) Signed: 03/09/2022 4:45:05 PM By: Blanche East RN Entered By: Blanche East on 03/09/2022 14:48:27 -------------------------------------------------------------------------------- Lower Extremity Assessment Details Patient Name: Date of Service: [name], [name] 03/09/2022 2:30 PM Medical Record Number: [id number] Patient Account Number: [id number] Date of Birth/Sex: Treating RN: Sep 14, 1940 (81 y.o. [name] Primary Care Chyna Kneece: Juline Patch Other Clinician: Referring Deron Poole: Treating Payzlee Ryder/Extender: Theressa Stamps Weeks in Treatment: 0 Edema Assessment Assessed: [Left: No] [Right: No] Edema: [Left: Ye]  [Right: s] Calf Left: Right: Point of Measurement: From Medial Instep 32 cm Ankle Left: Right: Point of Measurement: From Medial Instep 21 cm Vascular Assessment Pulses: Dorsalis Pedis Palpable: [Right:Yes] Electronic Signature(s) Signed: 03/09/2022 4:45:05 PM By: Blanche East RN Entered By: Blanche East on 03/09/2022 14:49:36 -------------------------------------------------------------------------------- Multi Wound Chart Details Patient Name: Date of Service: [name], [name] 03/09/2022 2:30 PM Medical Record Number: [id number] Patient Account Number: [id number] Date of Birth/Sex: Treating RN: [birth year] (81 y.o. F) Primary Care Taniela Feltus: Juline Patch Other Clinician: Referring Triniti Gruetzmacher: Treating Ellagrace Yoshida/Extender: Manson Allan in Treatment: 0 Vital Signs Height(in): 63 Pulse(bpm): 61 Weight(lbs): 142 Blood Pressure(mmHg): 109/64 Body Mass Index(BMI): 25.2 Temperature(F): 98.1 Respiratory Rate(breaths/min): 18 Photos: [N/A:N/A] Right, Anterior Lower Leg N/A N/A Wound Location: Trauma N/A N/A Wounding Event: Skin T ear N/A N/A Primary Etiology: Cataracts, Chronic Obstructive N/A N/A Comorbid History: Pulmonary Disease (COPD), Arrhythmia, Raynauds, Scleroderma, Confinement Anxiety 02/17/2022 N/A N/A Date Acquired: 0 N/A N/A Weeks of Treatment: Open N/A N/A Wound Status: No N/A N/A Wound Recurrence: 12x8x0.1 N/A N/A Measurements L x W x D (cm) 75.398 N/A N/A A (cm) : rea 7.54 N/A N/A Volume (cm) : -113.30% N/A N/A % Reduction in A rea: -113.40% N/A N/A % Reduction in Volume: Full Thickness Without Exposed N/A N/A Classification: Support Structures Medium N/A N/A Exudate A mount: Serosanguineous N/A N/A Exudate Type: red, brown N/A N/A Exudate Color: Flat and Intact N/A N/A Wound Margin: Small (1-33%) N/A N/A Granulation A mount: Red N/A N/A Granulation Quality: Large (67-100%) N/A N/A Necrotic A  mount: Eschar, Adherent Slough N/A N/A Necrotic Tissue: Fat Layer (Subcutaneous Tissue): Yes N/A N/A Exposed Structures: Fascia: No Tendon: No Muscle: No Joint: No Bone: No Large (67-100%) N/A N/A Epithelialization: Debridement - Selective/Open Wound N/A N/A Debridement: Pre-procedure Verification/Time Out 15:00 N/A N/A Taken: Slough N/A N/A Tissue Debrided: Non-Viable Tissue N/A  N/A Level: 4 N/A N/A Debridement A (sq cm): rea Curette N/A N/A Instrument: Minimum N/A N/A Bleeding: Pressure N/A N/A Hemostasis A chieved: 0 N/A N/A Procedural Pain: 0 N/A N/A Post Procedural Pain: Procedure was tolerated well N/A N/A Debridement Treatment Response: 12x8x0.1 N/A N/A Post Debridement Measurements L x W x D (cm) 7.54 N/A N/A Post Debridement Volume: (cm) Debridement N/A N/A Procedures Performed: Treatment Notes Electronic Signature(s) Signed: 03/09/2022 3:14:01 PM By: Fredirick Maudlin MD FACS Entered By: Fredirick Maudlin on 03/09/2022 15:14:00 -------------------------------------------------------------------------------- Multi-Disciplinary Care Plan Details Patient Name: Date of Service: [name], [name] 03/09/2022 2:30 PM Medical Record Number: [id number] Patient Account Number: [id number] Date of Birth/Sex: Treating RN: 1940-12-12 (81 y.o. [name], [name] Primary Care Jaedan Huttner: Juline Patch Other Clinician: Referring Waris Rodger: Treating Kinsley Holderman/Extender: Manson Allan in Treatment: 0 Active Inactive Abuse / Safety / Falls / Self Care Management Nursing Diagnoses: Potential for injury related to falls Goals: Patient will remain injury free related to falls Date Initiated: 03/03/2022 Target Resolution Date: 03/31/2022 Goal Status: Active Interventions: Provide education on fall prevention Notes: Orientation to the Wound Care Program Nursing Diagnoses: Knowledge deficit related to the wound healing center  program Goals: Patient/caregiver will verbalize understanding of the [place] Program Date Initiated: 03/03/2022 Target Resolution Date: 03/31/2022 Goal Status: Active Interventions: Provide education on orientation to the wound center Notes: Wound/Skin Impairment Nursing Diagnoses: Impaired tissue integrity Goals: Ulcer/skin breakdown will have a volume reduction of 30% by week 4 Date Initiated: 03/03/2022 Target Resolution Date: 03/31/2022 Goal Status: Active Interventions: Assess patient/caregiver ability to obtain necessary supplies Assess ulceration(s) every visit Treatment Activities: Skin care regimen initiated : 03/03/2022 Notes: Electronic Signature(s) Signed: 03/09/2022 4:45:05 PM By: Blanche East RN Entered By: Blanche East on 03/09/2022 14:51:13 -------------------------------------------------------------------------------- Pain Assessment Details Patient Name: Date of Service: [name], [name] 03/09/2022 2:30 PM Medical Record Number: [id number] Patient Account Number: [id number] Date of Birth/Sex: Treating RN: 09-01-1940 (81 y.o. [name] Primary Care Cerria Randhawa: Juline Patch Other Clinician: Referring Yoshio Seliga: Treating Priscille Shadduck/Extender: Manson Allan in Treatment: 0 Active Problems Location of Pain Severity and Description of Pain Patient Has Paino No Site Locations Pain Management and Medication Current Pain Management: Electronic Signature(s) Signed: 03/09/2022 4:45:05 PM By: Blanche East RN Entered By: Blanche East on 03/09/2022 14:49:23 -------------------------------------------------------------------------------- Patient/Caregiver Education Details Patient Name: Date of Service: [name], [name] 9/13/2023andnbsp2:30 PM Medical Record Number: [id number] Patient Account Number: [id number] Date of Birth/Gender: Treating RN: [birth year] (81 y.o. [name] Primary Care Physician: Juline Patch Other Clinician: Referring Physician: Treating Physician/Extender: Manson Allan in Treatment: 0 Education Assessment Education Provided To: Patient Education Topics Provided Safety: Methods: Explain/Verbal Responses: Reinforcements needed, State content correctly Electronic Signature(s) Signed: 03/09/2022 4:45:05 PM By: Blanche East RN Entered By: Blanche East on 03/09/2022 14:51:37 -------------------------------------------------------------------------------- Wound Assessment Details Patient Name: Date of Service: [name], [name] 03/09/2022 2:30 PM Medical Record Number: [id number] Patient Account Number: [id number] Date of Birth/Sex: Treating RN: 26-Sep-1940 (81 y.o. [name] Primary Care Amberia Bayless: Juline Patch Other Clinician: Referring Cletis Clack: Treating Samin Milke/Extender: Manson Allan in Treatment: 0 Wound Status Wound Number: 1 Primary Skin T ear Etiology: Wound Location: Right, Anterior Lower Leg Wound Open Wounding Event: Trauma Status: Date Acquired: 02/17/2022 Comorbid Cataracts, Chronic Obstructive Pulmonary Disease (COPD), Weeks Of Treatment: 0 History: Arrhythmia, Raynauds, Scleroderma, Confinement Anxiety  Clustered Wound: No Photos Wound Measurements Length: (cm) 12 Width: (cm) 8 Depth: (cm) 0.1 Area: (cm) 75.398 Volume: (cm) 7.54 % Reduction in Area: -113.3% % Reduction in Volume: -113.4% Epithelialization: Large (67-100%) Wound Description Classification: Full Thickness Without Exposed Support Structures Wound Margin: Flat and Intact Exudate Amount: Medium Exudate Type: Serosanguineous Exudate Color: red, brown Foul Odor After Cleansing: No Slough/Fibrino Yes Wound Bed Granulation Amount: Small (1-33%) Exposed Structure Granulation Quality: Red Fascia Exposed: No Necrotic Amount: Large (67-100%) Fat Layer (Subcutaneous Tissue) Exposed: Yes Necrotic Quality:  Eschar, Adherent Slough Tendon Exposed: No Muscle Exposed: No Joint Exposed: No Bone Exposed: No Electronic Signature(s) Signed: 03/09/2022 4:45:05 PM By: Blanche East RN Entered By: Blanche East on 03/09/2022 14:48:07 -------------------------------------------------------------------------------- Vitals Details Patient Name: Date of Service: [name], [name] 03/09/2022 2:30 PM Medical Record Number: [id number] Patient Account Number: [id number] Date of Birth/Sex: Treating RN: 04-Jan-1941 (81 y.o. [name], [name] Primary Care Kofi Murrell: Juline Patch Other Clinician: Referring Rhealyn Cullen: Treating Jude Linck/Extender: Manson Allan in Treatment: 0 Vital Signs Time Taken: 14:48 Temperature (F): 98.1 Height (in): 63 Pulse (bpm): 61 Weight (lbs): 142 Respiratory Rate (breaths/min): 18 Body Mass Index (BMI): 25.2 Blood Pressure (mmHg): 109/64 Reference Range: 80 - 120 mg / dl Electronic Signature(s) Signed: 03/09/2022 4:45:05 PM By: Blanche East RN Entered By: Blanche East on 03/09/2022 14:49:17

## 2022-03-10 ENCOUNTER — Encounter (HOSPITAL_BASED_OUTPATIENT_CLINIC_OR_DEPARTMENT_OTHER): Payer: Medicare Other | Admitting: General Surgery

## 2022-03-15 ENCOUNTER — Ambulatory Visit (HOSPITAL_BASED_OUTPATIENT_CLINIC_OR_DEPARTMENT_OTHER): Payer: Medicare Other | Admitting: General Surgery

## 2022-03-16 ENCOUNTER — Encounter (HOSPITAL_BASED_OUTPATIENT_CLINIC_OR_DEPARTMENT_OTHER): Payer: Medicare Other | Admitting: General Surgery

## 2022-03-16 DIAGNOSIS — L97812 Non-pressure chronic ulcer of other part of right lower leg with fat layer exposed: Secondary | ICD-10-CM | POA: Diagnosis not present

## 2022-03-16 NOTE — Progress Notes (Signed)
[name], [name] ([id number]) Visit Report for 03/16/2022 Arrival Information Details Patient Name: Date of Service: [name], [name] 03/16/2022 1:15 PM Medical Record Number: [id number] Patient Account Number: [id number] Date of Birth/Sex: Treating RN: 05-May-1941 (81 y.o. [name], [name] Primary Care [name]: [name] Other Clinician: Referring [name]: Treating [name]/Extender: Manson Allan in Treatment: 1 Visit Information History Since Last Visit All ordered tests and consults were completed: Yes Patient Arrived: Ambulatory Added or deleted any medications: No Arrival Time: 13:24 Any new allergies or adverse reactions: No Accompanied By: self Had a fall or experienced change in No Transfer Assistance: None activities of daily living that may affect Patient Has Alerts: Yes risk of falls: Patient Alerts: Patient on Blood Thinner Signs or symptoms of abuse/neglect since last visito No ABI RLE 1.07 Hospitalized since last visit: No Implantable device outside of the clinic excluding No cellular tissue based products placed in the center since last visit: Has Dressing in Place as Prescribed: Yes Has Compression in Place as Prescribed: Yes Pain Present Now: No Electronic Signature(s) Signed: 03/16/2022 5:15:08 PM By: Tara East RN Entered By: [name] on 03/16/2022 13:24:35 -------------------------------------------------------------------------------- Encounter Discharge Information Details Patient Name: Date of Service: [name], [place] Tara J. 03/16/2022 1:15 PM Medical Record Number: [id number] Patient Account Number: [id number] Date of Birth/Sex: Treating RN: 1940-08-09 (81 y.o. [name] Primary Care Haziel Molner: [name] Other Clinician: Referring Lilliam Chamblee: Treating [name]/Extender: Manson Allan in Treatment: 1 Encounter Discharge Information Items Post Procedure Vitals Discharge  Condition: Stable Temperature (F): 98.1 Ambulatory Status: Ambulatory Pulse (bpm): 72 Discharge Destination: Home Respiratory Rate (breaths/min): 16 Transportation: Private Auto Blood Pressure (mmHg): 125/76 Accompanied By: daughter Schedule Follow-up Appointment: Yes Clinical Summary of Care: Electronic Signature(s) Signed: 03/16/2022 5:15:08 PM By: Tara East RN Entered By: [name] on 03/16/2022 13:44:25 -------------------------------------------------------------------------------- Lower Extremity Assessment Details Patient Name: Date of Service: [name], [place] Tara J. 03/16/2022 1:15 PM Medical Record Number: [id number] Patient Account Number: [id number] Date of Birth/Sex: Treating RN: 09/27/1940 (81 y.o. [name] Primary Care Katlyn Muldrew: [name] Other Clinician: Referring Zared Knoth: Treating [name]/Extender: Manson Allan in Treatment: 1 Edema Assessment Assessed: [Left: No] [Right: No] Edema: [Left: Ye] [Right: s] Calf Left: Right: Point of Measurement: From Medial Instep 32.5 cm Ankle Left: Right: Point of Measurement: From Medial Instep 23.3 cm Vascular Assessment Pulses: Dorsalis Pedis Palpable: [Right:Yes] Electronic Signature(s) Signed: 03/16/2022 5:15:08 PM By: Tara East RN Entered By: [name] on 03/16/2022 13:30:19 -------------------------------------------------------------------------------- Multi Wound Chart Details Patient Name: Date of Service: [name], [place] Tara J. 03/16/2022 1:15 PM Medical Record Number: [id number] Patient Account Number: [id number] Date of Birth/Sex: Treating RN: 26-Oct-1940 (81 y.o. F) Primary Care Lido Maske: [name] Other Clinician: Referring Maddison Kilner: Treating Ellaree Gear/Extender: Manson Allan in Treatment: 1 Vital Signs Height(in): 63 Pulse(bpm): 63 Weight(lbs): 142 Blood Pressure(mmHg): 125/76 Body Mass Index(BMI):  25.2 Temperature(F): 98.1 Respiratory Rate(breaths/min): 16 Photos: [1:Right, Anterior Lower Leg] [N/A:N/A N/A] Wound Location: [1:Trauma] [N/A:N/A] Wounding Event: [1:Skin T ear] [N/A:N/A] Primary Etiology: [1:Cataracts, Chronic Obstructive] [N/A:N/A] Comorbid History: [1:Pulmonary Disease (COPD), Arrhythmia, Raynauds, Scleroderma, Confinement Anxiety 02/17/2022] [N/A:N/A] Date Acquired: [1:1] [N/A:N/A] Weeks of Treatment: [1:Open] [N/A:N/A] Wound Status: [1:No] [N/A:N/A] Wound Recurrence: [1:4x0.5x0.1] [N/A:N/A] Measurements L x W x D (cm) [1:1.571] [N/A:N/A] A (cm) : rea [1:0.157] [N/A:N/A] Volume (cm) : [1:95.60%] [N/A:N/A] % Reduction in A [1:rea: 95.60%] [N/A:N/A] % Reduction  in Volume: [1:Full Thickness Without Exposed] [N/A:N/A] Classification: [1:Support Structures Medium] [N/A:N/A] Exudate A mount: [1:Serosanguineous] [N/A:N/A] Exudate Type: [1:red, brown] [N/A:N/A] Exudate Color: [1:Flat and Intact] [N/A:N/A] Wound Margin: [1:Large (67-100%)] [N/A:N/A] Granulation A mount: [1:Red] [N/A:N/A] Granulation Quality: [1:Small (1-33%)] [N/A:N/A] Necrotic A mount: [1:Eschar, Adherent Slough] [N/A:N/A] Necrotic Tissue: [1:Fat Layer (Subcutaneous Tissue): Yes N/A] Exposed Structures: [1:Fascia: No Tendon: No Muscle: No Joint: No Bone: No Large (67-100%)] [N/A:N/A] Epithelialization: [1:Debridement - Selective/Open Wound N/A] Debridement: Pre-procedure Verification/Time Out 13:32 [N/A:N/A] Taken: [1:Necrotic/Eschar, Slough] [N/A:N/A] Tissue Debrided: [1:Non-Viable Tissue] [N/A:N/A] Level: [1:2] [N/A:N/A] Debridement A (sq cm): [1:rea Curette] [N/A:N/A] Instrument: [1:Minimum] [N/A:N/A] Bleeding: [1:Pressure] [N/A:N/A] Hemostasis A chieved: [1:0] [N/A:N/A] Procedural Pain: [1:0] [N/A:N/A] Post Procedural Pain: [1:Procedure was tolerated well] [N/A:N/A] Debridement Treatment Response: [1:4x0.5x0.1] [N/A:N/A] Post Debridement Measurements L x W x D (cm) [1:0.157]  [N/A:N/A] Post Debridement Volume: (cm) [1:Debridement] [N/A:N/A] Treatment Notes Wound #1 (Lower Leg) Wound Laterality: Right, Anterior Cleanser Soap and Water Discharge Instruction: May shower and wash wound with dial antibacterial soap and water prior to dressing change. Peri-Wound Care Sween Lotion (Moisturizing lotion) Discharge Instruction: Apply moisturizing lotion as directed Topical Primary Dressing KerraCel Ag Gelling Fiber Dressing, 4x5 in (silver alginate) Discharge Instruction: Apply silver alginate to wound bed as instructed Secondary Dressing T Non-Adherent Dressing, 3x4 in elfa Discharge Instruction: Apply over primary dressing as directed. Zetuvit Plus 4x8 in Discharge Instruction: Apply over primary dressing as directed. Secured With [employer] 4x5 (in/yd) Discharge Instruction: Secure with Coban as directed. Kerlix Roll Sterile, 4.5x3.1 (in/yd) Discharge Instruction: Secure with Kerlix as directed. 92M Medipore H Soft Cloth Surgical T ape, 4 x 10 (in/yd) Discharge Instruction: Secure with tape as directed. Compression Wrap Compression Stockings Add-Ons Electronic Signature(s) Signed: 03/16/2022 1:46:29 PM By: Fredirick Maudlin MD FACS Entered By: Fredirick Maudlin on 03/16/2022 13:46:29 -------------------------------------------------------------------------------- Multi-Disciplinary Care Plan Details Patient Name: Date of Service: [name], [place] Tara J. 03/16/2022 1:15 PM Medical Record Number: [id number] Patient Account Number: [id number] Date of Birth/Sex: Treating RN: Jan 05, 1941 (81 y.o. [name], [name] Primary Care Kiara Keep: [name] Other Clinician: Referring Charlea Nardo: Treating Chelsae Zanella/Extender: Manson Allan in Treatment: 1 Active Inactive Abuse / Safety / Falls / Self Care Management Nursing Diagnoses: Potential for injury related to falls Goals: Patient will remain injury free related to  falls Date Initiated: 03/03/2022 Target Resolution Date: 03/31/2022 Goal Status: Active Interventions: Provide education on fall prevention Notes: Orientation to the Wound Care Program Nursing Diagnoses: Knowledge deficit related to the wound healing center program Goals: Patient/caregiver will verbalize understanding of the [facility] Date Initiated: 03/03/2022 Target Resolution Date: 03/31/2022 Goal Status: Active Interventions: Provide education on orientation to the wound center Notes: Wound/Skin Impairment Nursing Diagnoses: Impaired tissue integrity Goals: Ulcer/skin breakdown will have a volume reduction of 30% by week 4 Date Initiated: 03/03/2022 Target Resolution Date: 03/31/2022 Goal Status: Active Interventions: Assess patient/caregiver ability to obtain necessary supplies Assess ulceration(s) every visit Treatment Activities: Skin care regimen initiated : 03/03/2022 Notes: Electronic Signature(s) Signed: 03/16/2022 5:15:08 PM By: Tara East RN Entered By: [name] on 03/16/2022 13:34:26 -------------------------------------------------------------------------------- Pain Assessment Details Patient Name: Date of Service: [name], [place] Tara J. 03/16/2022 1:15 PM Medical Record Number: [id number] Patient Account Number: [id number] Date of Birth/Sex: Treating RN: November 29, 1940 (81 y.o. [name] Primary Care Obie Kallenbach: [name] Other Clinician: Referring Mattelyn Imhoff: Treating Ahaan Zobrist/Extender: Manson Allan in Treatment: 1 Active Problems Location of Pain Severity and Description of  Pain Patient Has Paino Yes Site Locations Pain Management and Medication Current Pain Management: Electronic Signature(s) Signed: 03/16/2022 5:15:08 PM By: Tara East RN Entered By: [name] on 03/16/2022 13:27:15 -------------------------------------------------------------------------------- Patient/Caregiver Education  Details Patient Name: Date of Service: [name], [place] Tara Lenna Sciara 9/20/2023andnbsp1:15 PM Medical Record Number: [id number] Patient Account Number: [id number] Date of Birth/Gender: Treating RN: 1941-01-31 (81 y.o. [name] Primary Care Physician: [name] Other Clinician: Referring Physician: Treating Physician/Extender: Manson Allan in Treatment: 1 Education Assessment Education Provided To: Patient Education Topics Provided Safety: Methods: Explain/Verbal Responses: Reinforcements needed, State content correctly Wound/Skin Impairment: Methods: Explain/Verbal Responses: Reinforcements needed, State content correctly Electronic Signature(s) Signed: 03/16/2022 5:15:08 PM By: Tara East RN Entered By: [name] on 03/16/2022 13:34:44 -------------------------------------------------------------------------------- Wound Assessment Details Patient Name: Date of Service: [name], PA Tara J. 03/16/2022 1:15 PM Medical Record Number: [id number] Patient Account Number: [id number] Date of Birth/Sex: Treating RN: 1941-04-18 (81 y.o. [name], [name] Primary Care Zakery Normington: [name] Other Clinician: Referring Janna Oak: Treating Lorry Anastasi/Extender: Manson Allan in Treatment: 1 Wound Status Wound Number: 1 Primary Skin T ear Etiology: Wound Location: Right, Anterior Lower Leg Wound Open Wounding Event: Trauma Status: Date Acquired: 02/17/2022 Comorbid Cataracts, Chronic Obstructive Pulmonary Disease (COPD), Weeks Of Treatment: 1 History: Arrhythmia, Raynauds, Scleroderma, Confinement Anxiety Clustered Wound: No Photos Wound Measurements Length: (cm) 4 Width: (cm) 0.5 Depth: (cm) 0.1 Area: (cm) 1.571 Volume: (cm) 0.157 % Reduction in Area: 95.6% % Reduction in Volume: 95.6% Epithelialization: Large (67-100%) Tunneling: No Undermining: No Wound Description Classification: Full Thickness Without  Exposed Support Structures Wound Margin: Flat and Intact Exudate Amount: Medium Exudate Type: Serosanguineous Exudate Color: red, brown Wound Bed Granulation Amount: Large (67-100%) Granulation Quality: Red Necrotic Amount: Small (1-33%) Necrotic Quality: Eschar, Adherent Slough Foul Odor After Cleansing: No Slough/Fibrino Yes Exposed Structure Fascia Exposed: No Fat Layer (Subcutaneous Tissue) Exposed: Yes Tendon Exposed: No Muscle Exposed: No Joint Exposed: No Bone Exposed: No Treatment Notes Wound #1 (Lower Leg) Wound Laterality: Right, Anterior Cleanser Soap and Water Discharge Instruction: May shower and wash wound with dial antibacterial soap and water prior to dressing change. Peri-Wound Care Sween Lotion (Moisturizing lotion) Discharge Instruction: Apply moisturizing lotion as directed Topical Primary Dressing KerraCel Ag Gelling Fiber Dressing, 4x5 in (silver alginate) Discharge Instruction: Apply silver alginate to wound bed as instructed Secondary Dressing T Non-Adherent Dressing, 3x4 in elfa Discharge Instruction: Apply over primary dressing as directed. Zetuvit Plus 4x8 in Discharge Instruction: Apply over primary dressing as directed. Secured With [employer] 4x5 (in/yd) Discharge Instruction: Secure with Coban as directed. Kerlix Roll Sterile, 4.5x3.1 (in/yd) Discharge Instruction: Secure with Kerlix as directed. 66M Medipore H Soft Cloth Surgical T ape, 4 x 10 (in/yd) Discharge Instruction: Secure with tape as directed. Compression Wrap Compression Stockings Add-Ons Electronic Signature(s) Signed: 03/16/2022 5:15:08 PM By: Tara East RN Entered By: [name] on 03/16/2022 13:33:17 -------------------------------------------------------------------------------- Vitals Details Patient Name: Date of Service: [name], [place] Tara J. 03/16/2022 1:15 PM Medical Record Number: [id number] Patient Account Number: [id number] Date of  Birth/Sex: Treating RN: [birth year] (81 y.o. [name], [name] Primary Care Haris Baack: [name] Other Clinician: Referring Blakeley Margraf: Treating Ward Boissonneault/Extender: Manson Allan in Treatment: 1 Vital Signs Time Taken: 13:24 Temperature (F): 98.1 Height (in): 63 Pulse (bpm): 72 Weight (lbs): 142 Respiratory Rate (breaths/min): 16 Body Mass Index (BMI): 25.2 Blood Pressure (mmHg): 125/76 Reference Range: 80 - 120 mg /  dl Electronic Signature(s) Signed: 03/16/2022 5:15:08 PM By: Tara East RN Entered By: [name] on 03/16/2022 13:27:00

## 2022-03-16 NOTE — Progress Notes (Signed)
[name], [name] ([id number]) Visit Report for 03/16/2022 Chief Complaint Document Details Patient Name: Date of Service: CO Tara N, PA Willette Brace 03/16/2022 1:15 PM Medical Record Number: [id number] Patient Account Number: [id number] Date of Birth/Sex: Treating RN: [birth year] (81 y.o. F) Primary Care Provider: Juline Patch Other Clinician: Referring Provider: Treating Provider/Extender: Manson Allan in Treatment: 1 Information Obtained from: Patient Chief Complaint Patient seen for complaints of Non-Healing Wound. Electronic Signature(s) Signed: 03/16/2022 1:46:35 PM By: Fredirick Maudlin MD FACS Entered By: Fredirick Maudlin on 03/16/2022 13:46:34 -------------------------------------------------------------------------------- Debridement Details Patient Name: Date of Service: CO Tara N, PA [name]. 03/16/2022 1:15 PM Medical Record Number: [id number] Patient Account Number: [id number] Date of Birth/Sex: Treating RN: Jun 16, 1941 (81 y.o. [name] Primary Care Provider: Juline Patch Other Clinician: Referring Provider: Treating Provider/Extender: Manson Allan in Treatment: 1 Debridement Performed for Assessment: Wound #1 Right,Anterior Lower Leg Performed By: Physician Fredirick Maudlin, MD Debridement Type: Debridement Level of Consciousness (Pre-procedure): Awake and Alert Pre-procedure Verification/Time Out Yes - 13:32 Taken: Start Time: 13:33 T Area Debrided (L x W): otal 4 (cm) x 0.5 (cm) = 2 (cm) Tissue and other material debrided: Non-Viable, Eschar, Todd Level: Non-Viable Tissue Debridement Description: Selective/Open Wound Instrument: Curette Bleeding: Minimum Hemostasis Achieved: Pressure Procedural Pain: 0 Post Procedural Pain: 0 Response to Treatment: Procedure was tolerated well Level of Consciousness (Post- Awake and Alert procedure): Post Debridement Measurements of Total Wound Length:  (cm) 4 Width: (cm) 0.5 Depth: (cm) 0.1 Volume: (cm) 0.157 Character of Wound/Ulcer Post Debridement: Improved Post Procedure Diagnosis Same as Pre-procedure Notes Scribed for Dr. Celine Ahr by Blanche East, RN Electronic Signature(s) Signed: 03/16/2022 1:56:30 PM By: Fredirick Maudlin MD FACS Signed: 03/16/2022 5:15:08 PM By: Blanche East RN Entered By: Blanche East on 03/16/2022 13:42:55 -------------------------------------------------------------------------------- HPI Details Patient Name: Date of Service: CO Tara N, PA [name]. 03/16/2022 1:15 PM Medical Record Number: [id number] Patient Account Number: [id number] Date of Birth/Sex: Treating RN: 1941/05/28 (81 y.o. F) Primary Care Provider: Juline Patch Other Clinician: Referring Provider: Treating Provider/Extender: Manson Allan in Treatment: 1 History of Present Illness HPI Description: ADMISSION 03/03/2022 This is an 81 year old woman with a past medical history significant for scleroderma, interstitial lung disease, [name] syndrome, chronic steroid use, hereditary hemochromatosis cytosis and emphysema. She is chronically on corticosteroids as well as mycophenolate. She was visiting her brother in [place] when she fell walking up the steps to the house. She suffered a laceration to her right anterior lower leg. She was seen at a local healthcare facility. The wound was dressed and she was discharged. She has returned to [place] and was referred to the wound care center for further evaluation and management. She and her daughter have been caring for it at home. They have been applying a nonadherent gauze over the wound and wrapping it with Kerlix and Coban. There is a U-shaped wound on the patient's anterior tibial surface. The wound edges are actually secured with Steri-Strips and there is no exposed tissue. There does appear to have been some serous drainage that has crusted around the edges of  the wound but there is no sign or concern for infection. Just proximal to the main wound, she has a smaller abrasion that does get down into the fat layer. There is a little bit of slough and eschar present at this site. 03/09/2022: Much of the wound has healed. The Steri-Strips were removed to reveal just  a small open portion at the most distal part of the wound. There is slough accumulation. No concern for infection. 03/16/2022: There is just a small open portion of the wound. It has a little slough accumulation. She was complaining that the areas where the wound is healing and there is eschar, she is noticing a pulling or tight sensation. Electronic Signature(s) Signed: 03/16/2022 1:47:16 PM By: Fredirick Maudlin MD FACS Entered By: Fredirick Maudlin on 03/16/2022 13:47:15 -------------------------------------------------------------------------------- Physical Exam Details Patient Name: Date of Service: CO Tara N, PA [name]. 03/16/2022 1:15 PM Medical Record Number: [id number] Patient Account Number: [id number] Date of Birth/Sex: Treating RN: 12-30-1940 (81 y.o. F) Primary Care Provider: Juline Patch Other Clinician: Referring Provider: Treating Provider/Extender: Manson Allan in Treatment: 1 Constitutional . . . . No acute distress.Marland Kitchen Respiratory Normal work of breathing on room air.. Notes 03/16/2022: There is just a small open portion of the wound. It has a little slough accumulation. She was complaining that the areas where the wound is healing and there is eschar, she is noticing a pulling or tight sensation. Electronic Signature(s) Signed: 03/16/2022 1:49:19 PM By: Fredirick Maudlin MD FACS Entered By: Fredirick Maudlin on 03/16/2022 13:49:19 -------------------------------------------------------------------------------- Physician Orders Details Patient Name: Date of Service: CO Tara N, PA [name]. 03/16/2022 1:15 PM Medical Record Number:  [id number] Patient Account Number: [id number] Date of Birth/Sex: Treating RN: 21-Mar-1941 (81 y.o. Tara Nestle, [name] Primary Care Provider: Juline Patch Other Clinician: Referring Provider: Treating Provider/Extender: Manson Allan in Treatment: 1 Verbal / Phone Orders: No Diagnosis Coding ICD-10 Coding Code Description [phone] Non-pressure chronic ulcer of other part of right lower leg with fat layer exposed I73.00 [name] syndrome without gangrene E83.110 Hereditary hemochromatosis J84.9 Interstitial pulmonary disease, unspecified J43.9 Emphysema, unspecified Z79.899 Other long term (current) drug therapy Follow-up Appointments ppointment in 1 week. - Dr. Celine Ahr Rm 4 Return A Bathing/ Shower/ Hygiene May shower with protection but do not get wound dressing(s) wet. - Wear cast protector, [facility] Wound Treatment Wound #1 - Lower Leg Wound Laterality: Right, Anterior Cleanser: Soap and Water Discharge Instructions: May shower and wash wound with dial antibacterial soap and water prior to dressing change. Peri-Wound Care: Sween Lotion (Moisturizing lotion) Discharge Instructions: Apply moisturizing lotion as directed Prim Dressing: KerraCel Ag Gelling Fiber Dressing, 4x5 in (silver alginate) ary Discharge Instructions: Apply silver alginate to wound bed as instructed Secondary Dressing: T Non-Adherent Dressing, 3x4 in elfa Discharge Instructions: Apply over primary dressing as directed. Secondary Dressing: Zetuvit Plus 4x8 in Discharge Instructions: Apply over primary dressing as directed. Secured With: Coban Self-Adherent Wrap 4x5 (in/yd) Discharge Instructions: Secure with Coban as directed. Secured With: [employer], 4.5x3.1 (in/yd) Discharge Instructions: Secure with Kerlix as directed. Secured With: 80M Medipore H Soft Cloth Surgical T ape, 4 x 10 (in/yd) Discharge Instructions: Secure with tape as directed. Electronic  Signature(s) Signed: 03/16/2022 1:49:30 PM By: Fredirick Maudlin MD FACS Entered By: Fredirick Maudlin on 03/16/2022 13:49:30 -------------------------------------------------------------------------------- Problem List Details Patient Name: Date of Service: CO Tara N, PA [name]. 03/16/2022 1:15 PM Medical Record Number: [id number] Patient Account Number: [id number] Date of Birth/Sex: Treating RN: Sep 20, 1940 (81 y.o. [name] Primary Care Provider: Juline Patch Other Clinician: Referring Provider: Treating Provider/Extender: Manson Allan in Treatment: 1 Active Problems ICD-10 Encounter Code Description Active Date MDM Diagnosis [phone] Non-pressure chronic ulcer of other part of right lower leg with fat layer 03/03/2022 No  Yes exposed I73.00 [name] syndrome without gangrene 03/03/2022 No Yes E83.110 Hereditary hemochromatosis 03/03/2022 No Yes J84.9 Interstitial pulmonary disease, unspecified 03/03/2022 No Yes J43.9 Emphysema, unspecified 03/03/2022 No Yes Z79.899 Other long term (current) drug therapy 03/03/2022 No Yes Inactive Problems Resolved Problems Electronic Signature(s) Signed: 03/16/2022 1:46:23 PM By: Fredirick Maudlin MD FACS Entered By: Fredirick Maudlin on 03/16/2022 13:46:23 -------------------------------------------------------------------------------- Progress Note Details Patient Name: Date of Service: CO Tara N, PA [name]. 03/16/2022 1:15 PM Medical Record Number: [id number] Patient Account Number: [id number] Date of Birth/Sex: Treating RN: February 25, 1941 (81 y.o. F) Primary Care Provider: Juline Patch Other Clinician: Referring Provider: Treating Provider/Extender: Manson Allan in Treatment: 1 Subjective Chief Complaint Information obtained from Patient Patient seen for complaints of Non-Healing Wound. History of Present Illness (HPI) ADMISSION 03/03/2022 This is an 81 year old woman with a past medical  history significant for scleroderma, interstitial lung disease, [name] syndrome, chronic steroid use, hereditary hemochromatosis cytosis and emphysema. She is chronically on corticosteroids as well as mycophenolate. She was visiting her brother in [place] when she fell walking up the steps to the house. She suffered a laceration to her right anterior lower leg. She was seen at a local healthcare facility. The wound was dressed and she was discharged. She has returned to [place] and was referred to the wound care center for further evaluation and management. She and her daughter have been caring for it at home. They have been applying a nonadherent gauze over the wound and wrapping it with Kerlix and Coban. There is a U-shaped wound on the patient's anterior tibial surface. The wound edges are actually secured with Steri-Strips and there is no exposed tissue. There does appear to have been some serous drainage that has crusted around the edges of the wound but there is no sign or concern for infection. Just proximal to the main wound, she has a smaller abrasion that does get down into the fat layer. There is a little bit of slough and eschar present at this site. 03/09/2022: Much of the wound has healed. The Steri-Strips were removed to reveal just a small open portion at the most distal part of the wound. There is slough accumulation. No concern for infection. 03/16/2022: There is just a small open portion of the wound. It has a little slough accumulation. She was complaining that the areas where the wound is healing and there is eschar, she is noticing a pulling or tight sensation. Patient History Information obtained from Patient. Family History Diabetes - Siblings, Heart Disease - Father, Hypertension - Mother, Kidney Disease - Father, Stroke - Mother, No family history of Cancer, Hereditary Spherocytosis, Lung Disease, Seizures, Thyroid Problems, Tuberculosis. Social History Former  smoker - quit 25 yrs ago, Marital Status - Widowed, Alcohol Use - Moderate, Drug Use - No History, Caffeine Use - Daily - coffee. Medical History Eyes Patient has history of Cataracts - bil removed Ear/Nose/Mouth/Throat Denies history of Chronic sinus problems/congestion, Middle ear problems Respiratory Patient has history of Chronic Obstructive Pulmonary Disease (COPD) - mild Denies history of Aspiration, Asthma, Pneumothorax, Sleep Apnea, Tuberculosis Cardiovascular Patient has history of Arrhythmia - afib Endocrine Denies history of Type I Diabetes, Type II Diabetes Genitourinary Denies history of End Stage Renal Disease Immunological Patient has history of Raynaudoos, Scleroderma - lungs Denies history of Lupus Erythematosus Integumentary (Skin) Denies history of History of Burn Oncologic Denies history of Received Chemotherapy, Received Radiation Psychiatric Patient has history of Confinement Anxiety Denies history of Anorexia/bulimia Hospitalization/Surgery  History - mouth surgery. - left shouilder surgery. - total abdominal hysterectomy. Medical A Surgical History Notes nd Ear/Nose/Mouth/Throat hearing loss, uses hearing aids Hematologic/Lymphatic hemochromatosis Respiratory pulmonary fibrosis, emphysema Endocrine hypothyroidism Objective Constitutional No acute distress.. Vitals Time Taken: 1:24 PM, Height: 63 in, Weight: 142 lbs, BMI: 25.2, Temperature: 98.1 F, Pulse: 72 bpm, Respiratory Rate: 16 breaths/min, Blood Pressure: 125/76 mmHg. Respiratory Normal work of breathing on room air.. General Notes: 03/16/2022: There is just a small open portion of the wound. It has a little slough accumulation. She was complaining that the areas where the wound is healing and there is eschar, she is noticing a pulling or tight sensation. Integumentary (Hair, Skin) Wound #1 status is Open. Original cause of wound was Trauma. The date acquired was: 02/17/2022. The wound has  been in treatment 1 weeks. The wound is located on the Right,Anterior Lower Leg. The wound measures 4cm length x 0.5cm width x 0.1cm depth; 1.571cm^2 area and 0.157cm^3 volume. There is Fat Layer (Subcutaneous Tissue) exposed. There is no tunneling or undermining noted. There is a medium amount of serosanguineous drainage noted. The wound margin is flat and intact. There is large (67-100%) red granulation within the wound bed. There is a small (1-33%) amount of necrotic tissue within the wound bed including Eschar and Adherent Slough. Assessment Active Problems ICD-10 Non-pressure chronic ulcer of other part of right lower leg with fat layer exposed [name] syndrome without gangrene Hereditary hemochromatosis Interstitial pulmonary disease, unspecified Emphysema, unspecified Other long term (current) drug therapy Procedures Wound #1 Pre-procedure diagnosis of Wound #1 is a Skin T located on the Right,Anterior Lower Leg . There was a Selective/Open Wound Non-Viable Tissue ear Debridement with a total area of 2 sq cm performed by Fredirick Maudlin, MD. With the following instrument(s): Curette to remove Non-Viable tissue/material. Material removed includes Eschar and Slough and. No specimens were taken. A time out was conducted at 13:32, prior to the start of the procedure. A Minimum amount of bleeding was controlled with Pressure. The procedure was tolerated well with a pain level of 0 throughout and a pain level of 0 following the procedure. Post Debridement Measurements: 4cm length x 0.5cm width x 0.1cm depth; 0.157cm^3 volume. Character of Wound/Ulcer Post Debridement is improved. Post procedure Diagnosis Wound #1: Same as Pre-Procedure General Notes: Scribed for Dr. Celine Ahr by Blanche East, RN. Plan Follow-up Appointments: Return Appointment in 1 week. - Dr. Celine Ahr Rm 4 Bathing/ Shower/ Hygiene: May shower with protection but do not get wound dressing(s) wet. - Wear cast protector,  [facility] WOUND #1: - Lower Leg Wound Laterality: Right, Anterior Cleanser: Soap and Water Discharge Instructions: May shower and wash wound with dial antibacterial soap and water prior to dressing change. Peri-Wound Care: Sween Lotion (Moisturizing lotion) Discharge Instructions: Apply moisturizing lotion as directed Prim Dressing: KerraCel Ag Gelling Fiber Dressing, 4x5 in (silver alginate) ary Discharge Instructions: Apply silver alginate to wound bed as instructed Secondary Dressing: T Non-Adherent Dressing, 3x4 in elfa Discharge Instructions: Apply over primary dressing as directed. Secondary Dressing: Zetuvit Plus 4x8 in Discharge Instructions: Apply over primary dressing as directed. Secured With: Coban Self-Adherent Wrap 4x5 (in/yd) Discharge Instructions: Secure with Coban as directed. Secured With: [employer], 4.5x3.1 (in/yd) Discharge Instructions: Secure with Kerlix as directed. Secured With: 33M Medipore H Soft Cloth Surgical T ape, 4 x 10 (in/yd) Discharge Instructions: Secure with tape as directed. 03/16/2022: There is just a small open portion of the wound. It has a little slough accumulation.  She was complaining that the areas where the wound is healing and there is eschar, she is noticing a pulling or tight sensation. I used a curette to debride the slough from the open portion of the wound. I also debrided the eschar from around the already healed areas, thinking that this may help alleviate her itching. Continue silver alginate with Kerlix and Coban wrap. Follow-up in 1 week. I anticipate that she may be healed at that time. Electronic Signature(s) Signed: 03/16/2022 1:50:37 PM By: Fredirick Maudlin MD FACS Entered By: Fredirick Maudlin on 03/16/2022 13:50:36 -------------------------------------------------------------------------------- HxROS Details Patient Name: Date of Service: CO Tara N, PA [name]. 03/16/2022 1:15 PM Medical Record Number:  [id number] Patient Account Number: [id number] Date of Birth/Sex: Treating RN: 01-19-1941 (81 y.o. F) Primary Care Provider: Juline Patch Other Clinician: Referring Provider: Treating Provider/Extender: Manson Allan in Treatment: 1 Information Obtained From Patient Eyes Medical History: Positive for: Cataracts - bil removed Ear/Nose/Mouth/Throat Medical History: Negative for: Chronic sinus problems/congestion; Middle ear problems Past Medical History Notes: hearing loss, uses hearing aids Hematologic/Lymphatic Medical History: Past Medical History Notes: hemochromatosis Respiratory Medical History: Positive for: Chronic Obstructive Pulmonary Disease (COPD) - mild Negative for: Aspiration; Asthma; Pneumothorax; Sleep Apnea; Tuberculosis Past Medical History Notes: pulmonary fibrosis, emphysema Cardiovascular Medical History: Positive for: Arrhythmia - afib Endocrine Medical History: Negative for: Type I Diabetes; Type II Diabetes Past Medical History Notes: hypothyroidism Genitourinary Medical History: Negative for: End Stage Renal Disease Immunological Medical History: Positive for: Raynauds; Scleroderma - lungs Negative for: Lupus Erythematosus Integumentary (Skin) Medical History: Negative for: History of Burn Oncologic Medical History: Negative for: Received Chemotherapy; Received Radiation Psychiatric Medical History: Positive for: Confinement Anxiety Negative for: Anorexia/bulimia HBO Extended History Items Eyes: Cataracts Immunizations Pneumococcal Vaccine: Received Pneumococcal Vaccination: Yes Received Pneumococcal Vaccination On or After 60th Birthday: Yes Implantable Devices None Hospitalization / Surgery History Type of Hospitalization/Surgery mouth surgery left shouilder surgery total abdominal hysterectomy Family and Social History Cancer: No; Diabetes: Yes - Siblings; Heart Disease: Yes - Father; Hereditary  Spherocytosis: No; Hypertension: Yes - Mother; Kidney Disease: Yes - Father; Lung Disease: No; Seizures: No; Stroke: Yes - Mother; Thyroid Problems: No; Tuberculosis: No; Former smoker - quit 25 yrs ago; Marital Status - Widowed; Alcohol Use: Moderate; Drug Use: No History; Caffeine Use: Daily - coffee; Financial Concerns: No; Food, Clothing or Shelter Needs: No; Support System Lacking: No; Transportation Concerns: No Electronic Signature(s) Signed: 03/16/2022 1:56:30 PM By: Fredirick Maudlin MD FACS Entered By: Fredirick Maudlin on 03/16/2022 13:48:48 -------------------------------------------------------------------------------- SuperBill Details Patient Name: Date of Service: CO Tara N, PA [name]. 03/16/2022 Medical Record Number: [id number] Patient Account Number: [id number] Date of Birth/Sex: Treating RN: 1941/02/18 (81 y.o. F) Primary Care Provider: Juline Patch Other Clinician: Referring Provider: Treating Provider/Extender: Manson Allan in Treatment: 1 Diagnosis Coding ICD-10 Codes Code Description [phone] Non-pressure chronic ulcer of other part of right lower leg with fat layer exposed I73.00 [name] syndrome without gangrene E83.110 Hereditary hemochromatosis J84.9 Interstitial pulmonary disease, unspecified J43.9 Emphysema, unspecified Z79.899 Other long term (current) drug therapy Facility Procedures CPT4 Code: [id number] Description: [phone] - DEBRIDE WOUND 1ST 20 SQ CM OR < ICD-10 Diagnosis Description L97.812 Non-pressure chronic ulcer of other part of right lower leg with fat layer exp Modifier: osed Quantity: 1 Physician Procedures : CPT4 Code Description Modifier [id number] - WC PHYS LEVEL 3 - EST PT 25 ICD-10 Diagnosis Description L97.812 Non-pressure chronic ulcer of other part of right lower  leg with fat layer exposed I73.00 [name] syndrome without gangrene Z79.899 Other  long term (current) drug therapy J84.9 Interstitial  pulmonary disease, unspecified Quantity: 1 : [phone] [id number] - WC PHYS DEBR WO ANESTH 20 SQ CM ICD-10 Diagnosis Description U19.914 Non-pressure chronic ulcer of other part of right lower leg with fat layer exposed Quantity: 1 Electronic Signature(s) Signed: 03/16/2022 1:52:18 PM By: Fredirick Maudlin MD FACS Entered By: Fredirick Maudlin on 03/16/2022 13:52:17

## 2022-03-24 ENCOUNTER — Other Ambulatory Visit: Payer: Self-pay | Admitting: Internal Medicine

## 2022-03-25 ENCOUNTER — Encounter (HOSPITAL_BASED_OUTPATIENT_CLINIC_OR_DEPARTMENT_OTHER): Payer: Medicare Other | Admitting: General Surgery

## 2022-03-25 DIAGNOSIS — L97812 Non-pressure chronic ulcer of other part of right lower leg with fat layer exposed: Secondary | ICD-10-CM | POA: Diagnosis not present

## 2022-03-25 NOTE — Progress Notes (Signed)
[name], [name] ([id number]) Visit Report for 03/25/2022 Arrival Information Details Patient Name: Date of Service: [name], [place] [name] 03/25/2022 9:45 A M Medical Record Number: [id number] Patient Account Number: [id number] Date of Birth/Sex: Treating RN: June 12, 1941 (81 y.o. [name], [name] Primary Care Kameren Pargas: Juline Patch Other Clinician: Referring Dulcemaria Bula: Treating Gaynor Ferreras/Extender: Manson Allan in Treatment: 3 Visit Information History Since Last Visit All ordered tests and consults were completed: Yes Patient Arrived: Ambulatory Added or deleted any medications: No Arrival Time: 09:52 Any new allergies or adverse reactions: No Accompanied By: daughter Had a fall or experienced change in No Transfer Assistance: None activities of daily living that may affect Patient Identification Verified: Yes risk of falls: Secondary Verification Process Completed: Yes Signs or symptoms of abuse/neglect since last visito No Patient Has Alerts: Yes Hospitalized since last visit: No Patient Alerts: Patient on Blood Thinner Implantable device outside of the clinic excluding No ABI RLE 1.07 cellular tissue based products placed in the center since last visit: Has Dressing in Place as Prescribed: Yes Has Compression in Place as Prescribed: Yes Pain Present Now: No Electronic Signature(s) Signed: 03/25/2022 4:38:59 PM By: Blanche East RN Entered By: Blanche East on 03/25/2022 09:56:05 -------------------------------------------------------------------------------- Encounter Discharge Information Details Patient Name: Date of Service: [name], [name] 03/25/2022 9:45 A M Medical Record Number: [id number] Patient Account Number: [id number] Date of Birth/Sex: Treating RN: [birth year] (81 y.o. [name], [name] Primary Care Mabeline Varas: Juline Patch Other Clinician: Referring Zharia Conrow: Treating Deitrich Steve/Extender: Manson Allan  in Treatment: 3 Encounter Discharge Information Items Post Procedure Vitals Discharge Condition: Stable Temperature (F): 98.2 Ambulatory Status: Ambulatory Pulse (bpm): 68 Discharge Destination: Home Respiratory Rate (breaths/min): 18 Transportation: Private Auto Blood Pressure (mmHg): 129/80 Accompanied By: daughter Schedule Follow-up Appointment: Yes Clinical Summary of Care: Electronic Signature(s) Signed: 03/25/2022 4:38:59 PM By: Blanche East RN Entered By: Blanche East on 03/25/2022 10:29:41 -------------------------------------------------------------------------------- Lower Extremity Assessment Details Patient Name: Date of Service: [name], [name] 03/25/2022 9:45 A M Medical Record Number: [id number] Patient Account Number: [id number] Date of Birth/Sex: Treating RN: 1941/06/16 (81 y.o. [name], [name] Primary Care Emry Barbato: Juline Patch Other Clinician: Referring Marifer Hurd: Treating Riniyah Speich/Extender: Theressa Stamps Weeks in Treatment: 3 Edema Assessment Assessed: [Left: No] [Right: No] Edema: [Left: Ye] [Right: s] Calf Left: Right: Point of Measurement: From Medial Instep 32.5 cm Ankle Left: Right: Point of Measurement: From Medial Instep 23.3 cm Electronic Signature(s) Signed: 03/25/2022 4:38:59 PM By: Blanche East RN Entered By: Blanche East on 03/25/2022 09:56:48 -------------------------------------------------------------------------------- Multi Wound Chart Details Patient Name: Date of Service: [name], [name] 03/25/2022 9:45 A M Medical Record Number: [id number] Patient Account Number: [id number] Date of Birth/Sex: Treating RN: 1940-11-11 (81 y.o. F) Primary Care Kandie Keiper: Juline Patch Other Clinician: Referring Krithika Tome: Treating Xayvier Vallez/Extender: Manson Allan in Treatment: 3 Vital Signs Height(in): Pulse(bpm): 17 Weight(lbs): 142 Blood Pressure(mmHg): 129/80 Body Mass  Index(BMI): Temperature(F): 98.2 Respiratory Rate(breaths/min): 18 Photos: [N/A:N/A] Right, Anterior Lower Leg N/A N/A Wound Location: Trauma N/A N/A Wounding Event: Skin Tear N/A N/A Primary Etiology: Cataracts, Chronic Obstructive N/A N/A Comorbid History: Pulmonary Disease (COPD), Arrhythmia, Raynauds, Scleroderma, Confinement Anxiety 02/17/2022 N/A N/A Date Acquired: 3 N/A N/A Weeks of Treatment: Open N/A N/A Wound Status: No N/A N/A Wound Recurrence: 3x0.4x0.1 N/A N/A Measurements L x W x D (cm) 0.942 N/A N/A A (cm) : rea 0.094 N/A N/A  Volume (cm) : 97.30% N/A N/A % Reduction in Area: 97.30% N/A N/A % Reduction in Volume: Full Thickness Without Exposed N/A N/A Classification: Support Structures Medium N/A N/A Exudate A mount: Serosanguineous N/A N/A Exudate Type: red, brown N/A N/A Exudate Color: Flat and Intact N/A N/A Wound Margin: Large (67-100%) N/A N/A Granulation A mount: Red N/A N/A Granulation Quality: Small (1-33%) N/A N/A Necrotic A mount: Eschar, Adherent Slough N/A N/A Necrotic Tissue: Fat Layer (Subcutaneous Tissue): Yes N/A N/A Exposed Structures: Fascia: No Tendon: No Muscle: No Joint: No Bone: No Large (67-100%) N/A N/A Epithelialization: Debridement - Selective/Open Wound N/A N/A Debridement: Pre-procedure Verification/Time Out 10:11 N/A N/A Taken: Slough N/A N/A Tissue Debrided: Non-Viable Tissue N/A N/A Level: 1.2 N/A N/A Debridement A (sq cm): rea Curette N/A N/A Instrument: Minimum N/A N/A Bleeding: Pressure N/A N/A Hemostasis A chieved: 0 N/A N/A Procedural Pain: 0 N/A N/A Post Procedural Pain: Procedure was tolerated well N/A N/A Debridement Treatment Response: 3x0.4x0.1 N/A N/A Post Debridement Measurements L x W x D (cm) 0.094 N/A N/A Post Debridement Volume: (cm) Debridement N/A N/A Procedures Performed: Treatment Notes Wound #1 (Lower Leg) Wound Laterality: Right, Anterior Cleanser Soap  and Water Discharge Instruction: May shower and wash wound with dial antibacterial soap and water prior to dressing change. Peri-Wound Care Sween Lotion (Moisturizing lotion) Discharge Instruction: Apply moisturizing lotion as directed Topical Mupirocin Ointment Discharge Instruction: Apply Mupirocin (Bactroban) as instructed Primary Dressing KerraCel Ag Gelling Fiber Dressing, 4x5 in (silver alginate) Discharge Instruction: Apply silver alginate to wound bed as instructed Secondary Dressing T Non-Adherent Dressing, 3x4 in elfa Discharge Instruction: Apply over primary dressing as directed. Zetuvit Plus 4x8 in Discharge Instruction: Apply over primary dressing as directed. Secured With [employer] 4x5 (in/yd) Discharge Instruction: Secure with Coban as directed. Kerlix Roll Sterile, 4.5x3.1 (in/yd) Discharge Instruction: Secure with Kerlix as directed. 75M Medipore H Soft Cloth Surgical T ape, 4 x 10 (in/yd) Discharge Instruction: Secure with tape as directed. Compression Wrap Compression Stockings Add-Ons Electronic Signature(s) Signed: 03/25/2022 11:03:11 AM By: Fredirick Maudlin MD FACS Entered By: Fredirick Maudlin on 03/25/2022 11:03:11 -------------------------------------------------------------------------------- Multi-Disciplinary Care Plan Details Patient Name: Date of Service: [name], [name] 03/25/2022 9:45 A M Medical Record Number: [id number] Patient Account Number: [id number] Date of Birth/Sex: Treating RN: 14-Dec-1940 (81 y.o. [name], [name] Primary Care Margalit Leece: Juline Patch Other Clinician: Referring Mcgwire Dasaro: Treating Viviane Semidey/Extender: Manson Allan in Treatment: 3 Active Inactive Abuse / Safety / Falls / Self Care Management Nursing Diagnoses: Potential for injury related to falls Goals: Patient will remain injury free related to falls Date Initiated: 03/03/2022 Target Resolution Date: 03/31/2022 Goal  Status: Active Interventions: Provide education on fall prevention Notes: Orientation to the Wound Care Program Nursing Diagnoses: Knowledge deficit related to the wound healing center program Goals: Patient/caregiver will verbalize understanding of the [facility] Program Date Initiated: 03/03/2022 Target Resolution Date: 03/31/2022 Goal Status: Active Interventions: Provide education on orientation to the wound center Notes: Wound/Skin Impairment Nursing Diagnoses: Impaired tissue integrity Goals: Ulcer/skin breakdown will have a volume reduction of 30% by week 4 Date Initiated: 03/03/2022 Target Resolution Date: 03/31/2022 Goal Status: Active Interventions: Assess patient/caregiver ability to obtain necessary supplies Assess ulceration(s) every visit Treatment Activities: Skin care regimen initiated : 03/03/2022 Notes: Electronic Signature(s) Signed: 03/25/2022 4:38:59 PM By: Blanche East RN Entered By: Blanche East on 03/25/2022 10:04:53 -------------------------------------------------------------------------------- Pain Assessment Details Patient Name: Date of Service: [name], [name] 03/25/2022  9:45 A M Medical Record Number: [id number] Patient Account Number: [id number] Date of Birth/Sex: Treating RN: 19-Jan-1941 (81 y.o. [name] Primary Care Carlin Attridge: Juline Patch Other Clinician: Referring Adelise Buswell: Treating Lue [place]/Extender: Manson Allan in Treatment: 3 Active Problems Location of Pain Severity and Description of Pain Patient Has Paino No Site Locations Pain Management and Medication Current Pain Management: Electronic Signature(s) Signed: 03/25/2022 4:38:59 PM By: Blanche East RN Entered By: Blanche East on 03/25/2022 09:56:41 -------------------------------------------------------------------------------- Patient/Caregiver Education Details Patient Name: Date of Service: [name], [name]  9/29/2023andnbsp9:45 A M Medical Record Number: [id number] Patient Account Number: [id number] Date of Birth/Gender: Treating RN: 1940-10-23 (81 y.o. [name] Primary Care Physician: Juline Patch Other Clinician: Referring Physician: Treating Physician/Extender: Manson Allan in Treatment: 3 Education Assessment Education Provided To: Patient Education Topics Provided Safety: Methods: Explain/Verbal Responses: Reinforcements needed, State content correctly [place]: o Methods: Explain/Verbal Responses: Reinforcements needed, State content correctly Electronic Signature(s) Signed: 03/25/2022 4:38:59 PM By: Blanche East RN Entered By: Blanche East on 03/25/2022 10:05:12 -------------------------------------------------------------------------------- Wound Assessment Details Patient Name: Date of Service: [name], [name] 03/25/2022 9:45 A M Medical Record Number: [id number] Patient Account Number: [id number] Date of Birth/Sex: Treating RN: June 18, 1941 (81 y.o. [name], [name] Primary Care Reza Crymes: Juline Patch Other Clinician: Referring Corine Solorio: Treating Clarice Zulauf/Extender: Manson Allan in Treatment: 3 Wound Status Wound Number: 1 Primary Skin T ear Etiology: Wound Location: Right, Anterior Lower Leg Wound Open Wounding Event: Trauma Status: Date Acquired: 02/17/2022 Comorbid Cataracts, Chronic Obstructive Pulmonary Disease (COPD), Weeks Of Treatment: 3 History: Arrhythmia, Raynauds, Scleroderma, Confinement Anxiety Clustered Wound: No Photos Wound Measurements Length: (cm) 3 Width: (cm) 0.4 Depth: (cm) 0.1 Area: (cm) 0.942 Volume: (cm) 0.094 % Reduction in Area: 97.3% % Reduction in Volume: 97.3% Epithelialization: Large (67-100%) Tunneling: No Undermining: No Wound Description Classification: Full Thickness Without Exposed Support Structures Wound Margin: Flat and  Intact Exudate Amount: Medium Exudate Type: Serosanguineous Exudate Color: red, brown Foul Odor After Cleansing: No Slough/Fibrino Yes Wound Bed Granulation Amount: Large (67-100%) Exposed Structure Granulation Quality: Red Fascia Exposed: No Necrotic Amount: Small (1-33%) Fat Layer (Subcutaneous Tissue) Exposed: Yes Necrotic Quality: Eschar, Adherent Slough Tendon Exposed: No Muscle Exposed: No Joint Exposed: No Bone Exposed: No Treatment Notes Wound #1 (Lower Leg) Wound Laterality: Right, Anterior Cleanser Soap and Water Discharge Instruction: May shower and wash wound with dial antibacterial soap and water prior to dressing change. Peri-Wound Care Sween Lotion (Moisturizing lotion) Discharge Instruction: Apply moisturizing lotion as directed Topical Mupirocin Ointment Discharge Instruction: Apply Mupirocin (Bactroban) as instructed Primary Dressing KerraCel Ag Gelling Fiber Dressing, 4x5 in (silver alginate) Discharge Instruction: Apply silver alginate to wound bed as instructed Secondary Dressing T Non-Adherent Dressing, 3x4 in elfa Discharge Instruction: Apply over primary dressing as directed. Zetuvit Plus 4x8 in Discharge Instruction: Apply over primary dressing as directed. Secured With [employer] 4x5 (in/yd) Discharge Instruction: Secure with Coban as directed. Kerlix Roll Sterile, 4.5x3.1 (in/yd) Discharge Instruction: Secure with Kerlix as directed. 48M Medipore H Soft Cloth Surgical T ape, 4 x 10 (in/yd) Discharge Instruction: Secure with tape as directed. Compression Wrap Compression Stockings Add-Ons Electronic Signature(s) Signed: 03/25/2022 4:38:59 PM By: Blanche East RN Entered By: Blanche East on 03/25/2022 10:03:03 -------------------------------------------------------------------------------- Vitals Details Patient Name: Date of Service: [name], [name] 03/25/2022 9:45 A M Medical Record Number: [id number] Patient Account  Number:  [id number] Date of Birth/Sex: Treating RN: 1940-12-28 (81 y.o. [name], [name] Primary Care Khris Jansson: Juline Patch Other Clinician: Referring Fleming Prill: Treating Ann Groeneveld/Extender: Manson Allan in Treatment: 3 Vital Signs Time Taken: 09:56 Temperature (F): 98.2 Weight (lbs): 142 Pulse (bpm): 68 Respiratory Rate (breaths/min): 18 Blood Pressure (mmHg): 129/80 Reference Range: 80 - 120 mg / dl Electronic Signature(s) Signed: 03/25/2022 4:38:59 PM By: Blanche East RN Entered By: Blanche East on 03/25/2022 09:56:36

## 2022-03-25 NOTE — Progress Notes (Signed)
[name], AMER ([id number]) Visit Report for 03/25/2022 Chief Complaint Document Details Patient Name: Date of Service: [name], PA Tara J. 03/25/2022 9:45 A M Medical Record Number: [id number] Patient Account Number: [id number] Date of Birth/Sex: Treating RN: [birth year] (81 y.o. F) Primary Care Provider: Juline Patch Other Clinician: Referring Provider: Treating Provider/Extender: Manson Allan in Treatment: 3 Information Obtained from: Patient Chief Complaint Patient seen for complaints of Non-Healing Wound. Electronic Signature(s) Signed: 03/25/2022 11:03:17 AM By: Fredirick Maudlin MD FACS Entered By: Fredirick Maudlin on 03/25/2022 11:03:17 -------------------------------------------------------------------------------- Debridement Details Patient Name: Date of Service: [name], PA Tara J. 03/25/2022 9:45 A M Medical Record Number: [id number] Patient Account Number: [id number] Date of Birth/Sex: Treating RN: [birth year] (81 y.o. Tara Nestle, [name] Primary Care Provider: Juline Patch Other Clinician: Referring Provider: Treating Provider/Extender: Manson Allan in Treatment: 3 Debridement Performed for Assessment: Wound #1 Right,Anterior Lower Leg Performed By: Physician Fredirick Maudlin, MD Debridement Type: Debridement Level of Consciousness (Pre-procedure): Awake and Alert Pre-procedure Verification/Time Out Yes - 10:11 Taken: Start Time: 10:12 T Area Debrided (L x W): otal 3 (cm) x 0.4 (cm) = 1.2 (cm) Tissue and other material debrided: Non-Viable, Slough, Slough Level: Non-Viable Tissue Debridement Description: Selective/Open Wound Instrument: Curette Bleeding: Minimum Hemostasis Achieved: Pressure Procedural Pain: 0 Post Procedural Pain: 0 Response to Treatment: Procedure was tolerated well Level of Consciousness (Post- Awake and Alert procedure): Post Debridement Measurements of Total Wound Length: (cm)  3 Width: (cm) 0.4 Depth: (cm) 0.1 Volume: (cm) 0.094 Character of Wound/Ulcer Post Debridement: Improved Post Procedure Diagnosis Same as Pre-procedure Notes Scribed for Dr. Celine Ahr by Blanche East, RN Electronic Signature(s) Signed: 03/25/2022 12:16:21 PM By: Fredirick Maudlin MD FACS Signed: 03/25/2022 4:38:59 PM By: Blanche East RN Entered By: Blanche East on 03/25/2022 10:17:57 -------------------------------------------------------------------------------- HPI Details Patient Name: Date of Service: [name], PA Tara J. 03/25/2022 9:45 A M Medical Record Number: [id number] Patient Account Number: [id number] Date of Birth/Sex: Treating RN: 1940-09-05 (81 y.o. F) Primary Care Provider: Juline Patch Other Clinician: Referring Provider: Treating Provider/Extender: Manson Allan in Treatment: 3 History of Present Illness HPI Description: ADMISSION 03/03/2022 This is an 81 year old woman with a past medical history significant for scleroderma, interstitial lung [name], [name] syndrome, chronic steroid use, hereditary hemochromatosis cytosis and emphysema. She is chronically on corticosteroids as well as mycophenolate. She was visiting her brother in [place] when she fell walking up the steps to the house. She suffered a laceration to her right anterior lower leg. She was seen at a local healthcare facility. The wound was dressed and she was discharged. She has returned to [place] and was referred to the wound care center for further evaluation and management. She and her daughter have been caring for it at home. They have been applying a nonadherent gauze over the wound and wrapping it with Kerlix and Coban. There is a U-shaped wound on the patient's anterior tibial surface. The wound edges are actually secured with Steri-Strips and there is no exposed tissue. There does appear to have been some serous drainage that has crusted around the edges of the  wound but there is no sign or concern for infection. Just proximal to the main wound, she has a smaller abrasion that does get down into the fat layer. There is a little bit of slough and eschar present at this site. 03/09/2022: Much of the wound has healed. The Steri-Strips were removed to  reveal just a small open portion at the most distal part of the wound. There is slough accumulation. No concern for infection. 03/16/2022: There is just a small open portion of the wound. It has a little slough accumulation. She was complaining that the areas where the wound is healing and there is eschar, she is noticing a pulling or tight sensation. 03/26/2019: The open portion at the most caudal aspect of her wound is red, swollen, and more tender today. There is a bit of slough accumulation, but no purulent drainage. There is a linear opening area at the most proximal and lateral part of the wound with some slough accumulation; the rest of the wound has healed. Electronic Signature(s) Signed: 03/25/2022 11:04:44 AM By: Fredirick Maudlin MD FACS Entered By: Fredirick Maudlin on 03/25/2022 11:04:44 -------------------------------------------------------------------------------- Physical Exam Details Patient Name: Date of Service: [name], PA Tara J. 03/25/2022 9:45 A M Medical Record Number: [id number] Patient Account Number: [id number] Date of Birth/Sex: Treating RN: 04/06/1941 (81 y.o. F) Primary Care Provider: Juline Patch Other Clinician: Referring Provider: Treating Provider/Extender: Manson Allan in Treatment: 3 Constitutional . . . . No acute distress.Marland Kitchen Respiratory Normal work of breathing on room air.. Notes 03/26/2019: The open portion at the most caudal aspect of her wound is red, swollen, and more tender today. There is a bit of slough accumulation, but no purulent drainage. There is a linear opening area at the most proximal and lateral part of the wound with some  slough accumulation; the rest of the wound has healed. Electronic Signature(s) Signed: 03/25/2022 11:05:33 AM By: Fredirick Maudlin MD FACS Entered By: Fredirick Maudlin on 03/25/2022 11:05:32 -------------------------------------------------------------------------------- Physician Orders Details Patient Name: Date of Service: [name], PA Tara J. 03/25/2022 9:45 A M Medical Record Number: [id number] Patient Account Number: [id number] Date of Birth/Sex: Treating RN: 1941/06/05 (81 y.o. Tara Nestle, [name] Primary Care Provider: Juline Patch Other Clinician: Referring Provider: Treating Provider/Extender: Manson Allan in Treatment: 3 Verbal / Phone Orders: No Diagnosis Coding ICD-10 Coding Code Description [id number] Non-pressure chronic ulcer of other part of right lower leg with fat layer exposed I73.00 [name] syndrome without gangrene E83.110 Hereditary hemochromatosis J84.9 Interstitial pulmonary [name], unspecified J43.9 Emphysema, unspecified Z79.899 Other long term (current) drug therapy Follow-up Appointments ppointment in 1 week. - Dr. Celine Ahr Rm 4 Return A Nurse Visit: - Room 2 Thursday 03/31/22 at 11:30 Bathing/ Shower/ Hygiene May shower with protection but do not get wound dressing(s) wet. - Wear cast protector, [facility] Wound Treatment Wound #1 - Lower Leg Wound Laterality: Right, Anterior Cleanser: Soap and Water Discharge Instructions: May shower and wash wound with dial antibacterial soap and water prior to dressing change. Peri-Wound Care: Sween Lotion (Moisturizing lotion) Discharge Instructions: Apply moisturizing lotion as directed Topical: Mupirocin Ointment Discharge Instructions: Apply Mupirocin (Bactroban) as instructed Prim Dressing: KerraCel Ag Gelling Fiber Dressing, 4x5 in (silver alginate) ary Discharge Instructions: Apply silver alginate to wound bed as instructed Secondary Dressing: T Non-Adherent Dressing, 3x4  in elfa Discharge Instructions: Apply over primary dressing as directed. Secondary Dressing: Zetuvit Plus 4x8 in Discharge Instructions: Apply over primary dressing as directed. Secured With: Coban Self-Adherent Wrap 4x5 (in/yd) Discharge Instructions: Secure with Coban as directed. Secured With: [employer], 4.5x3.1 (in/yd) Discharge Instructions: Secure with Kerlix as directed. Secured With: 19M Medipore H Soft Cloth Surgical Tape, 4 x 10 (in/yd) Discharge Instructions: Secure with tape as directed. Laboratory erobe culture (MICRO) - Non-healing  wound Rt lower leg Bacteria identified in Unspecified specimen by A LOINC Code: 762-8 Convenience Name: Aerobic culture-specimen not specified Patient Medications llergies: No Known Allergies A Notifications Medication Indication Start End 03/25/2022 cephalexin DOSE oral 500 mg capsule - 1 capsule p.o. every 6 hours x5 days Electronic Signature(s) Signed: 03/25/2022 11:06:51 AM By: Fredirick Maudlin MD FACS Entered By: Fredirick Maudlin on 03/25/2022 11:06:51 -------------------------------------------------------------------------------- Problem List Details Patient Name: Date of Service: [name], PA Tara J. 03/25/2022 9:45 A M Medical Record Number: [id number] Patient Account Number: [id number] Date of Birth/Sex: Treating RN: [birth year] (81 y.o. F) Primary Care Provider: Juline Patch Other Clinician: Referring Provider: Treating Provider/Extender: Manson Allan in Treatment: 3 Active Problems ICD-10 Encounter Code Description Active Date MDM Diagnosis [phone] Non-pressure chronic ulcer of other part of right lower leg with fat layer 03/03/2022 No Yes exposed I73.00 [name] syndrome without gangrene 03/03/2022 No Yes E83.110 Hereditary hemochromatosis 03/03/2022 No Yes J84.9 Interstitial pulmonary [name], unspecified 03/03/2022 No Yes J43.9 Emphysema, unspecified 03/03/2022 No Yes Z79.899 Other long  term (current) drug therapy 03/03/2022 No Yes Inactive Problems Resolved Problems Electronic Signature(s) Signed: 03/25/2022 11:03:05 AM By: Fredirick Maudlin MD FACS Entered By: Fredirick Maudlin on 03/25/2022 11:03:05 -------------------------------------------------------------------------------- Progress Note Details Patient Name: Date of Service: [name], PA Tara J. 03/25/2022 9:45 A M Medical Record Number: [id number] Patient Account Number: [id number] Date of Birth/Sex: Treating RN: September 07, 1940 (81 y.o. F) Primary Care Provider: Juline Patch Other Clinician: Referring Provider: Treating Provider/Extender: Manson Allan in Treatment: 3 Subjective Chief Complaint Information obtained from Patient Patient seen for complaints of Non-Healing Wound. History of Present Illness (HPI) ADMISSION 03/03/2022 This is an 81 year old woman with a past medical history significant for scleroderma, interstitial lung [name], [name] syndrome, chronic steroid use, hereditary hemochromatosis cytosis and emphysema. She is chronically on corticosteroids as well as mycophenolate. She was visiting her brother in [place] when she fell walking up the steps to the house. She suffered a laceration to her right anterior lower leg. She was seen at a local healthcare facility. The wound was dressed and she was discharged. She has returned to [place] and was referred to the wound care center for further evaluation and management. She and her daughter have been caring for it at home. They have been applying a nonadherent gauze over the wound and wrapping it with Kerlix and Coban. There is a U-shaped wound on the patient's anterior tibial surface. The wound edges are actually secured with Steri-Strips and there is no exposed tissue. There does appear to have been some serous drainage that has crusted around the edges of the wound but there is no sign or concern for infection. Just  proximal to the main wound, she has a smaller abrasion that does get down into the fat layer. There is a little bit of slough and eschar present at this site. 03/09/2022: Much of the wound has healed. The Steri-Strips were removed to reveal just a small open portion at the most distal part of the wound. There is slough accumulation. No concern for infection. 03/16/2022: There is just a small open portion of the wound. It has a little slough accumulation. She was complaining that the areas where the wound is healing and there is eschar, she is noticing a pulling or tight sensation. 03/26/2019: The open portion at the most caudal aspect of her wound is red, swollen, and more tender today. There is a bit of slough accumulation,  but no purulent drainage. There is a linear opening area at the most proximal and lateral part of the wound with some slough accumulation; the rest of the wound has healed. Patient History Information obtained from Patient. Family History Diabetes - Siblings, Heart [name] - Father, Hypertension - Mother, Kidney [name] - Father, Stroke - Mother, No family history of Cancer, Hereditary Spherocytosis, Lung [name], Seizures, Thyroid Problems, Tuberculosis. Social History Former smoker - quit 25 yrs ago, Marital Status - Widowed, Alcohol Use - Moderate, Drug Use - No History, Caffeine Use - Daily - coffee. Medical History Eyes Patient has history of Cataracts - bil removed Ear/Nose/Mouth/Throat Denies history of Chronic sinus problems/congestion, Middle ear problems Respiratory Patient has history of Chronic Obstructive Pulmonary [name] (COPD) - mild Denies history of Aspiration, Asthma, Pneumothorax, Sleep Apnea, Tuberculosis Cardiovascular Patient has history of Arrhythmia - afib Endocrine Denies history of Type I Diabetes, Type II Diabetes Genitourinary Denies history of End Stage Renal [name] Immunological Patient has history of Raynaudoos, Scleroderma -  lungs Denies history of Lupus Erythematosus Integumentary (Skin) Denies history of History of Burn Oncologic Denies history of Received Chemotherapy, Received Radiation Psychiatric Patient has history of Confinement Anxiety Denies history of Anorexia/bulimia Hospitalization/Surgery History - mouth surgery. - left shouilder surgery. - total abdominal hysterectomy. Medical A Surgical History Notes nd Ear/Nose/Mouth/Throat hearing loss, uses hearing aids Hematologic/Lymphatic hemochromatosis Respiratory pulmonary fibrosis, emphysema Endocrine hypothyroidism Objective Constitutional No acute distress.. Vitals Time Taken: 9:56 AM, Weight: 142 lbs, Temperature: 98.2 F, Pulse: 68 bpm, Respiratory Rate: 18 breaths/min, Blood Pressure: 129/80 mmHg. Respiratory Normal work of breathing on room air.. General Notes: 03/26/2019: The open portion at the most caudal aspect of her wound is red, swollen, and more tender today. There is a bit of slough accumulation, but no purulent drainage. There is a linear opening area at the most proximal and lateral part of the wound with some slough accumulation; the rest of the wound has healed. Integumentary (Hair, Skin) Wound #1 status is Open. Original cause of wound was Trauma. The date acquired was: 02/17/2022. The wound has been in treatment 3 weeks. The wound is located on the Right,Anterior Lower Leg. The wound measures 3cm length x 0.4cm width x 0.1cm depth; 0.942cm^2 area and 0.094cm^3 volume. There is Fat Layer (Subcutaneous Tissue) exposed. There is no tunneling or undermining noted. There is a medium amount of serosanguineous drainage noted. The wound margin is flat and intact. There is large (67-100%) red granulation within the wound bed. There is a small (1-33%) amount of necrotic tissue within the wound bed including Eschar and Adherent Slough. Assessment Active Problems ICD-10 Non-pressure chronic ulcer of other part of right lower leg  with fat layer exposed [name] syndrome without gangrene Hereditary hemochromatosis Interstitial pulmonary [name], unspecified Emphysema, unspecified Other long term (current) drug therapy Procedures Wound #1 Pre-procedure diagnosis of Wound #1 is a Skin T located on the Right,Anterior Lower Leg . There was a Selective/Open Wound Non-Viable Tissue ear Debridement with a total area of 1.2 sq cm performed by Fredirick Maudlin, MD. With the following instrument(s): Curette to remove Non-Viable tissue/material. Material removed includes [facility] No specimens were taken. A time out was conducted at 10:11, prior to the start of the procedure. A Minimum amount of bleeding was controlled with Pressure. The procedure was tolerated well with a pain level of 0 throughout and a pain level of 0 following the procedure. Post Debridement Measurements: 3cm length x 0.4cm width x 0.1cm depth; 0.094cm^3 volume. Character of Wound/Ulcer Post  Debridement is improved. Post procedure Diagnosis Wound #1: Same as Pre-Procedure General Notes: Scribed for Dr. Celine Ahr by Blanche East, RN. Plan Follow-up Appointments: Return Appointment in 1 week. - Dr. Alvera Singh 4 Nurse Visit: - Room 2 Thursday 03/31/22 at 11:30 Bathing/ Shower/ Hygiene: May shower with protection but do not get wound dressing(s) wet. - W[facility] cast protector, [facility] Laboratory ordered were: Aerobic culture-specimen not specified - Non-healing wound Rt lower leg The following medication(s) was prescribed: cephalexin oral 500 mg capsule 1 capsule p.o. every 6 hours x5 days starting 03/25/2022 WOUND #1: - Lower Leg Wound Laterality: Right, Anterior Cleanser: Soap and Water Discharge Instructions: May shower and wash wound with dial antibacterial soap and water prior to dressing change. Peri-Wound Care: Sween Lotion (Moisturizing lotion) Discharge Instructions: Apply moisturizing lotion as directed Topical: Mupirocin Ointment Discharge  Instructions: Apply Mupirocin (Bactroban) as instructed Prim Dressing: KerraCel Ag Gelling Fiber Dressing, 4x5 in (silver alginate) ary Discharge Instructions: Apply silver alginate to wound bed as instructed Secondary Dressing: T Non-Adherent Dressing, 3x4 in elfa Discharge Instructions: Apply over primary dressing as directed. Secondary Dressing: Zetuvit Plus 4x8 in Discharge Instructions: Apply over primary dressing as directed. Secured With: Coban Self-Adherent Wrap 4x5 (in/yd) Discharge Instructions: Secure with Coban as directed. Secured With: [employer], 4.5x3.1 (in/yd) Discharge Instructions: Secure with Kerlix as directed. Secured With: 46M Medipore H Soft Cloth Surgical T ape, 4 x 10 (in/yd) Discharge Instructions: Secure with tape as directed. 03/26/2019: The open portion at the most caudal aspect of her wound is red, swollen, and more tender today. There is a bit of slough accumulation, but no purulent drainage. There is a linear opening area at the most proximal and lateral part of the wound with some slough accumulation; the rest of the wound has healed. I used a curette to debride the slough from both of the wound sites that remain open. Due to the increasing redness, swelling, and pain, I also took a culture from the more distal area. I have empirically prescribed a 5-day course of Keflex. We will apply topical mupirocin to the site under silver alginate and Kerlix and Coban wrapping. Once the culture returns, I will adjust antibiotic therapy if indicated. Follow-up in 1 week. Electronic Signature(s) Signed: 03/25/2022 11:07:59 AM By: Fredirick Maudlin MD FACS Entered By: Fredirick Maudlin on 03/25/2022 11:07:59 -------------------------------------------------------------------------------- HxROS Details Patient Name: Date of Service: [name], PA Tara J. 03/25/2022 9:45 A M Medical Record Number: [id number] Patient Account Number: [id number] Date of Birth/Sex:  Treating RN: [birth year] (81 y.o. F) Primary Care Provider: Juline Patch Other Clinician: Referring Provider: Treating Provider/Extender: Manson Allan in Treatment: 3 Information Obtained From Patient Eyes Medical History: Positive for: Cataracts - bil removed Ear/Nose/Mouth/Throat Medical History: Negative for: Chronic sinus problems/congestion; Middle ear problems Past Medical History Notes: hearing loss, uses hearing aids Hematologic/Lymphatic Medical History: Past Medical History Notes: hemochromatosis Respiratory Medical History: Positive for: Chronic Obstructive Pulmonary [name] (COPD) - mild Negative for: Aspiration; Asthma; Pneumothorax; Sleep Apnea; Tuberculosis Past Medical History Notes: pulmonary fibrosis, emphysema Cardiovascular Medical History: Positive for: Arrhythmia - afib Endocrine Medical History: Negative for: Type I Diabetes; Type II Diabetes Past Medical History Notes: hypothyroidism Genitourinary Medical History: Negative for: End Stage Renal [name] Immunological Medical History: Positive for: Raynauds; Scleroderma - lungs Negative for: Lupus Erythematosus Integumentary (Skin) Medical History: Negative for: History of Burn Oncologic Medical History: Negative for: Received Chemotherapy; Received Radiation Psychiatric Medical History: Positive for: Confinement Anxiety Negative for:  Anorexia/bulimia HBO Extended History Items Eyes: Cataracts Immunizations Pneumococcal Vaccine: Received Pneumococcal Vaccination: Yes Received Pneumococcal Vaccination On or After 60th Birthday: Yes Implantable Devices None Hospitalization / Surgery History Type of Hospitalization/Surgery mouth surgery left shouilder surgery total abdominal hysterectomy Family and Social History Cancer: No; Diabetes: Yes - Siblings; Heart [name]: Yes - Father; Hereditary Spherocytosis: No; Hypertension: Yes - Mother; Kidney [name]: Yes  - Father; Lung [name]: No; Seizures: No; Stroke: Yes - Mother; Thyroid Problems: No; Tuberculosis: No; Former smoker - quit 25 yrs ago; Marital Status - Widowed; Alcohol Use: Moderate; Drug Use: No History; Caffeine Use: Daily - coffee; Financial Concerns: No; Food, Clothing or Shelter Needs: No; Support System Lacking: No; Transportation Concerns: No Electronic Signature(s) Signed: 03/25/2022 12:16:21 PM By: Fredirick Maudlin MD FACS Entered By: Fredirick Maudlin on 03/25/2022 11:04:51 -------------------------------------------------------------------------------- SuperBill Details Patient Name: Date of Service: [name], PA Tara J. 03/25/2022 Medical Record Number: [id number] Patient Account Number: [id number] Date of Birth/Sex: Treating RN: Nov 23, 1940 (81 y.o. F) Primary Care Provider: Juline Patch Other Clinician: Referring Provider: Treating Provider/Extender: Manson Allan in Treatment: 3 Diagnosis Coding ICD-10 Codes Code Description [phone] Non-pressure chronic ulcer of other part of right lower leg with fat layer exposed I73.00 [name] syndrome without gangrene E83.110 Hereditary hemochromatosis J84.9 Interstitial pulmonary [name], unspecified J43.9 Emphysema, unspecified Z79.899 Other long term (current) drug therapy Facility Procedures CPT4 Code: [id number] Description: [id number] - DEBRIDE WOUND 1ST 20 SQ CM OR < ICD-10 Diagnosis Description L97.812 Non-pressure chronic ulcer of other part of right lower leg with fat layer expos Modifier: ed Quantity: 1 Physician Procedures : CPT4 Code Description Modifier [id number] - WC PHYS LEVEL 4 - EST PT 25 ICD-10 Diagnosis Description L97.812 Non-pressure chronic ulcer of other part of right lower leg with fat layer exposed Z79.899 Other long term (current) drug therapy J43.9  Emphysema, unspecified I73.00 [name] syndrome without gangrene Quantity: 1 : [id number] - WC PHYS DEBR WO ANESTH 20 SQ CM  ICD-10 Diagnosis Description B58.309 Non-pressure chronic ulcer of other part of right lower leg with fat layer exposed Quantity: 1 Electronic Signature(s) Signed: 03/25/2022 11:08:18 AM By: Fredirick Maudlin MD FACS Entered By: Fredirick Maudlin on 03/25/2022 11:08:18

## 2022-03-28 NOTE — Telephone Encounter (Addendum)
Return call: Patient called back after returning home from a trip.  Informed patient of sleep study results and patient understanding was verbalized. Patient declined to go into the sleep lab for testing because she has a lung condition. Patient was grateful for the call and thanked me.   Per Dr Radford Pax, If unable to perform an in lab titration then initiate ResMed auto CPAP from 4 to 15cm H2O with heated humidity and mask of choice and overnight pulse ox on CPAP.     Upon patient request DME selection is [facility]. Patient understands he will be contacted by [facility] to set up his cpap. Patient understands to call if [facility] does not contact him with new setup in a timely manner. Patient understands they will be called once confirmation has been received from [facility] that they have received their new machine to schedule 10 week follow up appointment.   Tyro notified of new cpap order  Please add to airview Patient was grateful for the call and thanked me.

## 2022-03-28 NOTE — Addendum Note (Signed)
Addended by: Freada Bergeron on: 03/28/2022 11:02 AM   Modules accepted: Orders

## 2022-03-31 ENCOUNTER — Encounter (HOSPITAL_BASED_OUTPATIENT_CLINIC_OR_DEPARTMENT_OTHER): Payer: Medicare Other | Attending: General Surgery | Admitting: General Surgery

## 2022-03-31 DIAGNOSIS — Z09 Encounter for follow-up examination after completed treatment for conditions other than malignant neoplasm: Secondary | ICD-10-CM | POA: Diagnosis not present

## 2022-03-31 DIAGNOSIS — W19XXXA Unspecified fall, initial encounter: Secondary | ICD-10-CM | POA: Insufficient documentation

## 2022-03-31 DIAGNOSIS — Y92009 Unspecified place in unspecified non-institutional (private) residence as the place of occurrence of the external cause: Secondary | ICD-10-CM | POA: Insufficient documentation

## 2022-03-31 DIAGNOSIS — L97812 Non-pressure chronic ulcer of other part of right lower leg with fat layer exposed: Secondary | ICD-10-CM | POA: Insufficient documentation

## 2022-03-31 DIAGNOSIS — J849 Interstitial pulmonary disease, unspecified: Secondary | ICD-10-CM | POA: Insufficient documentation

## 2022-03-31 DIAGNOSIS — Z79899 Other long term (current) drug therapy: Secondary | ICD-10-CM | POA: Insufficient documentation

## 2022-03-31 DIAGNOSIS — I7300 Raynaud's syndrome without gangrene: Secondary | ICD-10-CM | POA: Diagnosis not present

## 2022-03-31 DIAGNOSIS — S81811A Laceration without foreign body, right lower leg, initial encounter: Secondary | ICD-10-CM | POA: Diagnosis present

## 2022-03-31 DIAGNOSIS — M349 Systemic sclerosis, unspecified: Secondary | ICD-10-CM | POA: Insufficient documentation

## 2022-03-31 DIAGNOSIS — J439 Emphysema, unspecified: Secondary | ICD-10-CM | POA: Insufficient documentation

## 2022-03-31 NOTE — Progress Notes (Signed)
VENISE, ELLINGWOOD ([id number]) Visit Report for 03/31/2022 Arrival Information Details Patient Name: Date of Service: [name], [place] [name] 03/31/2022 11:30 A M Medical Record Number: [id number] Patient Account Number: [id number] Date of Birth/Sex: Treating RN: [birth year] (81 y.o. [name], [name] Primary Care Jes Costales: Juline Patch Other Clinician: Referring Ayelen Sciortino: Treating Itzae Mccurdy/Extender: Manson Allan in Treatment: 4 Visit Information History Since Last Visit All ordered tests and consults were completed: Yes Patient Arrived: Ambulatory Added or deleted any medications: No Arrival Time: 12:09 Any new allergies or adverse reactions: No Accompanied By: self Had a fall or experienced change in No Transfer Assistance: None activities of daily living that may affect Patient Has Alerts: Yes risk of falls: Patient Alerts: Patient on Blood Thinner Signs or symptoms of abuse/neglect since last visito No ABI RLE 1.07 Hospitalized since last visit: No Has Dressing in Place as Prescribed: Yes Has Compression in Place as Prescribed: Yes Pain Present Now: No Electronic Signature(s) Signed: 03/31/2022 5:18:01 PM By: Blanche East RN Entered By: Blanche East on 03/31/2022 12:09:33 -------------------------------------------------------------------------------- Clinic Level of Care Assessment Details Patient Name: Date of Service: [name], [name] 03/31/2022 11:30 A M Medical Record Number: [id number] Patient Account Number: [id number] Date of Birth/Sex: Treating RN: 12/26/1940 (81 y.o. [name], [name] Primary Care Soriah Leeman: Juline Patch Other Clinician: Referring Tecumseh Yeagley: Treating Valia Wingard/Extender: Manson Allan in Treatment: 4 Clinic Level of Care Assessment Items TOOL 4 Quantity Score X- 1 0 Use when only an EandM is performed on FOLLOW-UP visit ASSESSMENTS - Nursing Assessment / Reassessment X- 1 10 Reassessment  of Co-morbidities (includes updates in patient status) X- 1 5 Reassessment of Adherence to Treatment Plan ASSESSMENTS - Wound and Skin A ssessment / Reassessment X - Simple Wound Assessment / Reassessment - one wound 1 5 '[]'$  - 0 Complex Wound Assessment / Reassessment - multiple wounds '[]'$  - 0 Dermatologic / Skin Assessment (not related to wound area) ASSESSMENTS - Focused Assessment X- 1 5 Circumferential Edema Measurements - multi extremities '[]'$  - 0 Nutritional Assessment / Counseling / Intervention '[]'$  - 0 Lower Extremity Assessment (monofilament, tuning fork, pulses) '[]'$  - 0 Peripheral Arterial Disease Assessment (using hand held doppler) ASSESSMENTS - Ostomy and/or Continence Assessment and Care '[]'$  - 0 Incontinence Assessment and Management '[]'$  - 0 Ostomy Care Assessment and Management (repouching, etc.) PROCESS - Coordination of Care X - Simple Patient / Family Education for ongoing care 1 15 '[]'$  - 0 Complex (extensive) Patient / Family Education for ongoing care X- 1 10 Staff obtains Programmer, systems, Records, T Results / Process Orders est X- 1 10 Staff telephones HHA, Nursing Homes / Clarify orders / etc '[]'$  - 0 Routine Transfer to another Facility (non-emergent condition) '[]'$  - 0 Routine Hospital Admission (non-emergent condition) '[]'$  - 0 New Admissions / Biomedical engineer / Ordering NPWT Apligraf, etc. , '[]'$  - 0 Emergency Hospital Admission (emergent condition) '[]'$  - 0 Simple Discharge Coordination '[]'$  - 0 Complex (extensive) Discharge Coordination PROCESS - Special Needs '[]'$  - 0 Pediatric / Minor Patient Management '[]'$  - 0 Isolation Patient Management '[]'$  - 0 Hearing / Language / Visual special needs '[]'$  - 0 Assessment of Community assistance (transportation, D/C planning, etc.) '[]'$  - 0 Additional assistance / Altered mentation '[]'$  - 0 Support Surface(s) Assessment (bed, cushion, seat, etc.) INTERVENTIONS - Wound Cleansing / Measurement X - Simple Wound Cleansing -  one wound 1 5 '[]'$  - 0 Complex Wound Cleansing - multiple wounds '[]'$  -  0 Wound Imaging (photographs - any number of wounds) '[]'$  - 0 Wound Tracing (instead of photographs) '[]'$  - 0 Simple Wound Measurement - one wound '[]'$  - 0 Complex Wound Measurement - multiple wounds INTERVENTIONS - Wound Dressings X - Small Wound Dressing one or multiple wounds 1 10 '[]'$  - 0 Medium Wound Dressing one or multiple wounds '[]'$  - 0 Large Wound Dressing one or multiple wounds '[]'$  - 0 Application of Medications - topical '[]'$  - 0 Application of Medications - injection INTERVENTIONS - Miscellaneous '[]'$  - 0 External ear exam '[]'$  - 0 Specimen Collection (cultures, biopsies, blood, body fluids, etc.) '[]'$  - 0 Specimen(s) / Culture(s) sent or taken to Lab for analysis '[]'$  - 0 Patient Transfer (multiple staff / Civil Service fast streamer / Similar devices) '[]'$  - 0 Simple Staple / Suture removal (25 or less) '[]'$  - 0 Complex Staple / Suture removal (26 or more) '[]'$  - 0 Hypo / Hyperglycemic Management (close monitor of Blood Glucose) '[]'$  - 0 Ankle / Brachial Index (ABI) - do not check if billed separately X- 1 5 Vital Signs Has the patient been seen at the hospital within the last three years: Yes Total Score: 80 Level Of Care: New/Established - Level 3 Electronic Signature(s) Signed: 03/31/2022 5:18:01 PM By: Blanche East RN Entered By: Blanche East on 03/31/2022 12:11:25 -------------------------------------------------------------------------------- Encounter Discharge Information Details Patient Name: Date of Service: [name], [name] 03/31/2022 11:30 A M Medical Record Number: [id number] Patient Account Number: [id number] Date of Birth/Sex: Treating RN: March 13, 1941 (81 y.o.o. [name] Primary Care Kenroy Timberman: Juline Patch Other Clinician: Referring Volanda Mangine: Treating Devonda Pequignot/Extender: Manson Allan in Treatment: 4 Encounter Discharge Information Items Discharge Condition:  Stable Ambulatory Status: Ambulatory Discharge Destination: Home Transportation: Private Auto Accompanied By: self Schedule Follow-up Appointment: Yes Clinical Summary of Care: Electronic Signature(s) Signed: 03/31/2022 5:18:01 PM By: Blanche East RN Entered By: Blanche East on 03/31/2022 12:10:56 -------------------------------------------------------------------------------- Patient/Caregiver Education Details Patient Name: Date of Service: [name], [name] 10/5/2023andnbsp11:30 A M Medical Record Number: [id number] Patient Account Number: [id number] Date of Birth/Gender: Treating RN: 06-Sep-1940 (81 y.o. [name] Primary Care Physician: Juline Patch Other Clinician: Referring Physician: Treating Physician/Extender: Manson Allan in Treatment: 4 Education Assessment Education Provided To: Patient Education Topics Provided Safety: Methods: Explain/Verbal Responses: Reinforcements needed, State content correctly [place]ell: o Methods: Explain/Verbal Responses: Reinforcements needed, State content correctly Electronic Signature(s) Signed: 03/31/2022 5:18:01 PM By: Blanche East RN Entered By: Blanche East on 03/31/2022 12:10:46 -------------------------------------------------------------------------------- Wound Assessment Details Patient Name: Date of Service: [name], [name] 03/31/2022 11:30 A M Medical Record Number: [id number] Patient Account Number: [id number] Date of Birth/Sex: Treating RN: Oct 10, 1940 (81 y.o. [name], [name] Primary Care Kanan Sobek: Juline Patch Other Clinician: Referring Braxtyn Bojarski: Treating Falesha Schommer/Extender: Manson Allan in Treatment: 4 Wound Status Wound Number: 1 Primary Skin T ear Etiology: Wound Location: Right, Anterior Lower Leg Wound Open Wounding Event: Trauma Status: Date Acquired: 02/17/2022 Comorbid Cataracts, Chronic Obstructive  Pulmonary Disease (COPD), Weeks Of Treatment: 4 History: Arrhythmia, Raynauds, Scleroderma, Confinement Anxiety Clustered Wound: No Wound Measurements Length: (cm) 3 Width: (cm) 0.4 Depth: (cm) 0.1 Area: (cm) 0.942 Volume: (cm) 0.094 % Reduction in Area: 97.3% % Reduction in Volume: 97.3% Epithelialization: Large (67-100%) Tunneling: No Undermining: No Wound Description Classification: Full Thickness Without Exposed Support Structures Wound Margin: Flat and Intact Exudate Amount: Medium Exudate Type: Serosanguineous Exudate  Color: red, brown Foul Odor After Cleansing: No Slough/Fibrino Yes Wound Bed Granulation Amount: Large (67-100%) Exposed Structure Granulation Quality: Red Fascia Exposed: No Necrotic Amount: Small (1-33%) Fat Layer (Subcutaneous Tissue) Exposed: Yes Necrotic Quality: Eschar, Adherent Slough Tendon Exposed: No Muscle Exposed: No Joint Exposed: No Bone Exposed: No Periwound Skin Texture Texture Color No Abnormalities Noted: No No Abnormalities Noted: No Moisture No Abnormalities Noted: No Treatment Notes Wound #1 (Lower Leg) Wound Laterality: Right, Anterior Cleanser Soap and Water Discharge Instruction: May shower and wash wound with dial antibacterial soap and water prior to dressing change. Peri-Wound Care Sween Lotion (Moisturizing lotion) Discharge Instruction: Apply moisturizing lotion as directed Topical Mupirocin Ointment Discharge Instruction: Apply Mupirocin (Bactroban) as instructed Primary Dressing KerraCel Ag Gelling Fiber Dressing, 4x5 in (silver alginate) Discharge Instruction: Apply silver alginate to wound bed as instructed Secondary Dressing T Non-Adherent Dressing, 3x4 in elfa Discharge Instruction: Apply over primary dressing as directed. Zetuvit Plus 4x8 in Discharge Instruction: Apply over primary dressing as directed. Secured With [employer] 4x5 (in/yd) Discharge Instruction: Secure with Coban as  directed. Kerlix Roll Sterile, 4.5x3.1 (in/yd) Discharge Instruction: Secure with Kerlix as directed. 67M Medipore H Soft Cloth Surgical T ape, 4 x 10 (in/yd) Discharge Instruction: Secure with tape as directed. Compression Wrap Compression Stockings Add-Ons Electronic Signature(s) Signed: 03/31/2022 5:18:01 PM By: Blanche East RN Entered By: Blanche East on 03/31/2022 12:10:21 -------------------------------------------------------------------------------- Vitals Details Patient Name: Date of Service: [name], [name] 03/31/2022 11:30 A M Medical Record Number: [id number] Patient Account Number: [id number] Date of Birth/Sex: Treating RN: 02-05-1941 (81 y.o. [name], [name] Primary Care Yamilett Anastos: Juline Patch Other Clinician: Referring Avontae Burkhead: Treating Nataleah Scioneaux/Extender: Manson Allan in Treatment: 4 Vital Signs Time Taken: 11:33 Temperature (F): 97.8 Weight (lbs): 142 Pulse (bpm): 62 Respiratory Rate (breaths/min): 16 Blood Pressure (mmHg): 105/55 Reference Range: 80 - 120 mg / dl Electronic Signature(s) Signed: 03/31/2022 5:18:01 PM By: Blanche East RN Entered By: Blanche East on 03/31/2022 12:10:04

## 2022-03-31 NOTE — Progress Notes (Signed)
ANETTA, OLVERA ([id number]) Visit Report for 03/31/2022 SuperBill Details Patient Name: Date of Service: [name], [place] Willette Brace 03/31/2022 Medical Record Number: [id number] Patient Account Number: [id number] Date of Birth/Sex: Treating RN: [birth year] (81 y.o. [name] Primary Care Provider: Juline Patch Other Clinician: Referring Provider: Treating Provider/Extender: Manson Allan in Treatment: 4 Diagnosis Coding ICD-10 Codes Code Description [phone] Non-pressure chronic ulcer of other part of right lower leg with fat layer exposed I73.00 [name] syndrome without gangrene E83.110 Hereditary hemochromatosis J84.9 Interstitial pulmonary disease, unspecified J43.9 Emphysema, unspecified Z79.899 Other long term (current) drug therapy Facility Procedures CPT4 Code Description Modifier Quantity [id number] - WOUND CARE VISIT-LEV 3 EST PT 25 1 Electronic Signature(s) Signed: 03/31/2022 12:28:30 PM By: Fredirick Maudlin MD FACS Signed: 03/31/2022 5:18:01 PM By: Blanche East RN Entered By: Blanche East on 03/31/2022 12:11:33

## 2022-04-04 ENCOUNTER — Encounter (HOSPITAL_BASED_OUTPATIENT_CLINIC_OR_DEPARTMENT_OTHER): Payer: Medicare Other | Admitting: General Surgery

## 2022-04-04 DIAGNOSIS — Z09 Encounter for follow-up examination after completed treatment for conditions other than malignant neoplasm: Secondary | ICD-10-CM | POA: Diagnosis not present

## 2022-04-04 NOTE — Progress Notes (Signed)
Office Visit Note  Patient: [name]             Date of Birth: 1941/06/18           MRN: [id number]             PCP: Kathyrn Lass, MD Referring: Kathyrn Lass, MD Visit Date: 04/18/2022 Occupation: '@GUAROCC'$ @  Subjective:  Recent fall   History of Present Illness: [name] is a 81 y.o. female with history of scleroderma and ILD.  She remains on Myfortic 360 mg 1 tablet by mouth twice daily, Bactrim DS on Mondays, Wednesdays, and Fridays and prednisone 5 mg 1 tablet by mouth daily.  Patient reports that she had recent pulmonary function testing performed on 04/12/2022.  She is scheduled for an upcoming visit with Dr. Chase Caller on 04/21/2022.  She continues to experience shortness of breath on exertion especially when climbing steps but otherwise has not noticed any new or worsening pulmonary symptoms.  She has not had any recent or recurrent infections.  She reports that about 7 weeks ago she had a pretty traumatic fall but did not have any fractures.  She states that she had been under the care of wound care for a large wound on her right shin.  The wound has completely healed and she has been discharged from wound care.  She states that after the fall she was experiencing some increased myalgias and muscle tenderness.  She took methocarbamol more frequently which alleviated her symptoms.  She requested a refill of methocarbamol to be sent to the pharmacy today.  She states she continues to have some increased discomfort in her right hip and right knee.  She had a right trochanteric bursa cortisone injection on 10/27/2021 by Dr. Ernestina Patches as well as an epidural injection on 12/01/2021 provided significant relief but her symptoms have started to return.  She plans on following back up with Dr. Ernestina Patches for further evaluation.  She denies any joint swelling at this time.  She denies any new or worsening gastroenterology symptoms.  She has not had any dysphagia, GERD, or constipation. She  continues to have intermittent symptoms of [name] most days of the week.  She denies any digital ulcerations.  She has not had any recent rashes.  She has not noticed any increase skin tightness or thickening.  She has a prescription for nitroglycerin ointment but has not needed to use it recently.    Activities of Daily Living:  Patient reports morning stiffness for a few minutes.   Patient Reports nocturnal pain.  Difficulty dressing/grooming: Denies Difficulty climbing stairs: Denies Difficulty getting out of chair: Denies Difficulty using hands for taps, buttons, cutlery, and/or writing: Denies  Review of Systems  Constitutional:  Positive for fatigue.  HENT:  Positive for mouth dryness. Negative for mouth sores.   Eyes:  Negative for dryness.  Respiratory:  Positive for shortness of breath.   Cardiovascular:  Negative for chest pain and palpitations.  Gastrointestinal:  Negative for blood in stool, constipation and diarrhea.  Endocrine: Negative for increased urination.  Genitourinary:  Negative for involuntary urination.  Musculoskeletal:  Positive for myalgias, morning stiffness and myalgias. Negative for joint pain, gait problem, joint pain, joint swelling, muscle weakness and muscle tenderness.  Skin:  Positive for color change and hair loss. Negative for rash and sensitivity to sunlight.  Allergic/Immunologic: Negative for susceptible to infections.  Neurological:  Negative for dizziness and headaches.  Hematological:  Negative for swollen glands.  Psychiatric/Behavioral:  Positive for sleep disturbance. Negative for depressed mood. The patient is not nervous/anxious.     PMFS History:  Patient Active Problem List   Diagnosis Date Noted   High risk medication use 02/18/2019   Therapeutic drug monitoring 02/18/2019   Healthcare maintenance 02/18/2019   ILD (interstitial lung disease) ([address]) 11/28/2016   Multiple lung nodules on CT 11/28/2016   Abnormal laboratory test  11/18/2016   DDD L spine 11/18/2016   Scl-70 antibody positive 11/10/2016   History of hypothyroidism 11/10/2016   History of hemochromatosis 11/10/2016   Pulmonary emphysema ([place]) 08/31/2016   [name] phenomenon without gangrene 08/31/2016   Sinusitis, chronic 08/31/2016   Chronic cough 02/10/2016   Irritable larynx 02/10/2016   Stopped smoking with greater than 40 pack year history 02/10/2016   Chest pain, unspecified 10/06/2010   Shortness of breath 10/06/2010   Hypothyroidism    Hereditary hemochromatosis ([place]) 04/22/2010   ATRIAL FIBRILLATION 10/20/2008   PAC 10/20/2008   SYNCOPE AND COLLAPSE 10/20/2008    Past Medical History:  Diagnosis Date   Atrial fibrillation ([place])    a. Flecainide therapy;  b. event monitor 4/12   Chest pain    a. GXT myoview 4/12: no isch., EF 86%;   b. 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 Beryle Beams   History of syncope    Hypothyroidism     Family History  Problem Relation Age of Onset   Stroke Mother    Heart disease Father 53   Hemochromatosis Brother    Breast cancer Neg Hx    Past Surgical History:  Procedure Laterality Date   MOUTH SURGERY     SHOULDER ARTHROSCOPY Left    TOTAL ABDOMINAL HYSTERECTOMY     VARICOSE VEIN SURGERY     Social History   Social History Narrative   REGULAR EXERCISE   Immunization History  Administered Date(s) Administered   Fluad Quad(high Dose 65+) 02/18/2019   Influenza Split 03/27/2019   Influenza, High Dose Seasonal PF 08/29/2017, 04/26/2018, 04/29/2020, 05/12/2021   PFIZER(Purple Top)SARS-COV-2 Vaccination 07/22/2019, 08/12/2019, 03/11/2020   Pneumococcal Conjugate-13 06/12/2014   Pneumococcal Polysaccharide-23 06/27/2010, 04/25/2018   Tdap 03/16/2011, 07/05/2019   Zoster Recombinat (Shingrix) 04/08/2019   Zoster, Live 06/27/2010, 04/08/2019      Objective: Vital Signs: BP 108/65 (BP Location: Left Arm, Patient Position: Sitting, Cuff Size: Normal)   Pulse (!) 56   Resp 16   Ht 5' 3.5" (1.613 m)   Wt 141 lb 9.6 oz (64.2 kg)   BMI 24.69 kg/m    Physical Exam Vitals and nursing note reviewed.  Constitutional:      Appearance: She is well-developed.  HENT:     Head: Normocephalic and atraumatic.  Eyes:     Conjunctiva/sclera: Conjunctivae normal.  Cardiovascular:     Rate and Rhythm: Normal rate and regular rhythm.     Heart sounds: Normal heart sounds.  Pulmonary:     Effort: Pulmonary effort is normal.     Breath sounds: Normal breath sounds.  Abdominal:     General: Bowel sounds are normal.     Palpations: Abdomen is soft.  Musculoskeletal:     Cervical back: Normal range of motion.  Skin:    General: Skin is warm and dry.     Capillary Refill: Capillary refill takes 2 to 3 seconds.  Comments: No signs of sclerodactyly were noted on examination today.  No digital ulcerations noted.  A few telangiectasias noted on the right palm.  Neurological:     Mental Status: She is alert and oriented to person, place, and time.  Psychiatric:        Behavior: Behavior normal.       Musculoskeletal Exam: C-spine is limited range of motion with lateral rotation.  Shoulder joints, elbow joints, wrist joints, MCPs, PIPs, DIPs have good range of motion with no synovitis.  Complete fist formation bilaterally.  PIP and DIP thickening consistent with osteoarthritis of both hands.  CMC joint prominence and thickening noted bilaterally.  Hip joints have good range of motion with no groin pain.  Knee joints have good range of motion with no warmth or effusion.  Ankle joints have good range of motion with no tenderness or joint swelling.  No tenderness or synovitis over MTP joints.  Dorsal spurs noted bilaterally.  Tailor bunions noted bilaterally.  CDAI Exam: CDAI Score: -- Patient Global: --; Provider Global: -- Swollen: --;  Tender: -- Joint Exam 04/18/2022   No joint exam has been documented for this visit   There is currently no information documented on the homunculus. Go to the Rheumatology activity and complete the homunculus joint exam.  Investigation: No additional findings.  Imaging: No results found.  Recent Labs: Lab Results  Component Value Date   WBC 10.7 11/16/2021   HGB 11.8 11/16/2021   PLT 300 11/16/2021   NA 139 11/16/2021   K 4.3 11/16/2021   CL 101 11/16/2021   CO2 28 11/16/2021   GLUCOSE 91 11/16/2021   BUN 18 11/16/2021   CREATININE 0.75 11/16/2021   BILITOT 0.5 11/16/2021   ALKPHOS 43 07/23/2021   AST 13 11/16/2021   ALT 13 11/16/2021   PROT 6.6 11/16/2021   ALBUMIN 4.1 07/23/2021   CALCIUM 9.8 11/16/2021   GFRAA 101 06/09/2020   QFTBGOLDPLUS NEGATIVE 08/10/2017    Speciality Comments: No specialty comments available.  Procedures:  No procedures performed Allergies: Patient has no known allergies.    Assessment / Plan:     Visit Diagnoses: Scleroderma ([place]) - Nailbed capillary changes, anticardiolipin antibody+, Scl-70+, ILD:   No signs of sclerodactyly were noted on examination today.  She continues to experience intermittent symptoms of [name] phenomenon several days per week.  She had no digital ulcerations or signs of gangrene on examination today.  Her capillary refill was 2 to 3 seconds.  She has a prescription for nitroglycerin ointment but has not yet used it.  She was advised to notify if she develops any new or worsening symptoms this fall/winter. She has no signs of inflammatory arthritis at this time.  No synovitis was noted on examination today. She remains under the care of Dr. Chase Caller for management of ILD.  She has not noticed any new or worsening pulmonary symptoms.  She continues to have shortness of breath on exertion especially when climbing steps.  She had a high-resolution chest CT updated on 08/24/2021 revealing minimal lower lung  predominant subpleural reticular opacities with no evidence of progression.  Patient does not require antifibrotics at this time. She had updated pulmonary function testing on 04/12/2022.  She will be following up with Dr. Chase Caller on 04/21/2022 to further discuss.  She remains on Myfortic 360 mg 1 tablet by mouth twice daily, Bactrim DS on Mondays, Wednesdays, and Fridays and prednisone 5 mg 1 tablet daily.  She has not had any  recent or recurrent infections. Her blood pressure has remained well controlled.  Her blood pressure was 108/65 today in the office.  Discussed the risk of renal crisis in patients with scleroderma. She is not experiencing any new or worsening GI symptoms.  No worsening GERD, dysphagia, or constipation.  She sleeps with a wedge underneath her which seems to have helped with her symptoms of reflux. She was advised to notify us if she develops any new or worsening symptoms.  The following lab work will be obtained today for further evaluation.  She will follow-up in the office in 6 months or sooner if needed. - Plan: CBC with Differential/Platelet, COMPLETE METABOLIC PANEL WITH GFR, Urinalysis, Routine w reflex microscopic  Anticardiolipin antibody positive - Positive anticardiolipin IgM 47, positive beta-2 IgM 31.  She remains on Eliquis as prescribed.  The following lab work was updated today for further evaluation.- Plan: Cardiolipin antibodies, IgG, IgM, IgA, Beta-2 glycoprotein antibodies  ILD (interstitial lung disease) ([place]) - She is followed by Dr. Chase Caller.   High-resolution chest CT updated on 08/24/2021 revealed minimal lower lung predominant subpleural reticular opacities with no evidence of progression.   She experiences shortness of breath on exertion especially when climbing steps but has not noticed any other new or worsening pulmonary symptoms.   Patient remains on Myfortic, bactrim, and prednisone as prescribed.  Ofev was not added at her last OV.   PFTs updated  on 04/12/2022. Upcoming appointment with Dr. Chase Caller on 04/21/2022.  High risk medication use - Myfortic 360 mg 1 tablet by mouth twice daily, Bactrim DS on Mondays, Wednesdays, and Fridays and prednisone 5 mg 1 tablet by mouth daily by Dr. Chase Caller.  CBC and CMP updated on 11/16/21.  Orders for CBC and CMP released today.  Her next lab work will be due in January and every 3 months.  - Plan: CBC with Differential/Platelet, COMPLETE METABOLIC PANEL WITH GFR She has not had any recent or recurrent infections.   Long term systemic steroid user - She is on prednisone 5 mg p.o. daily for ILD by Dr. Chase Caller. Due to update DEXA. Advised patient to monitor blood pressure closely while on long term prednisone. - Plan: DG BONE DENSITY (DXA)  Primary osteoarthritis of both hands: She has PIP and DIP thickening consistent with osteoarthritis of both hands.  No synovitis noted.   Trochanteric bursitis of both hips:She has ongoing tenderness over the right trochanteric bursa.  She had a cortisone injection performed on 10/27/21 by Dr. Ernestina Patches.   DDD (degenerative disc disease), lumbar - Followed by Dr. Erlinda Hong and Dr. Ernestina Patches.  She had an epidural injection on 12/01/21.    Age-related osteoporosis without current pathological fracture - DEXA scan from Nov 04, 2019 T score -2.9 left femoral neck, BMD 0.532. She had a fall about 7 weeks ago but no fracture.  She has been taking a vitamin D supplement.  Overdue to update DEXA.   Order for DEXA placed today.  Different treatment options will be discussed pending DEXA results.  - Plan: DG BONE DENSITY (DXA)  Vitamin D deficiency: She takes vitamin D 5,000 units 4 times weekly.   Other medical conditions are listed as follows:   Multiple lung nodules on CT -High resolution chest CT updated on 08/24/21.   History of atrial fibrillation  History of emphysema ([place])  Former smoker  History of hemochromatosis - Followed by PCP.  She has not required a phlebotomy  in several years.  Patient requested updated lab work  today.  Results will be forwarded to PCP as requested.  - Plan: CBC with Differential/Platelet, Cardiolipin antibodies, IgG, IgM, IgA, Beta-2 glycoprotein antibodies, Ferritin  History of hypothyroidism - Patient requested to have TSH checked today and results will be forwarded to her PCP to review. Plan: TSH  Orders: Orders Placed This Encounter  Procedures   DG BONE DENSITY (DXA)   CBC with Differential/Platelet   COMPLETE METABOLIC PANEL WITH GFR   Urinalysis, Routine w reflex microscopic   Cardiolipin antibodies, IgG, IgM, IgA   Beta-2 glycoprotein antibodies   Ferritin   TSH   Meds ordered this encounter  Medications   methocarbamol (ROBAXIN) 500 MG tablet    Sig: Take 1 tablet (500 mg total) by mouth daily as needed for muscle spasms.    Dispense:  30 tablet    Refill:  2     Follow-Up Instructions: Return in about 6 months (around 10/18/2022) for Scleroderma, ILD.   Ofilia Neas, PA-C  Note - This record has been created using Dragon software.  Chart creation errors have been sought, but may not always  have been located. Such creation errors do not reflect on  the standard of medical care.

## 2022-04-08 NOTE — Progress Notes (Signed)
[name] ([id number]) [id number] Page 1 of 9 Visit Report for 04/04/2022 Chief Complaint Document Details Patient Name: Date of Service: Tara NDO N, [name] 04/04/2022 9:45 A M Medical Record Number: [id number] Patient Account Number: [id number] Date of Birth/Sex: Treating RN: 1941-01-03 (81 y.o. F) Primary Care Provider: Juline [name] Other Clinician: Referring Provider: Treating Provider/Extender: [name] in Treatment: 4 Information Obtained from: Patient Chief Complaint Patient seen for complaints of Non-Healing Wound. Electronic Signature(s) Signed: 04/04/2022 10:06:20 AM By: Tara Maudlin MD FACS Entered By: [name] on 04/04/2022 10:06:20 -------------------------------------------------------------------------------- Debridement Details Patient Name: Date of Service: Tara NDO N, [name] 04/04/2022 9:45 A M Medical Record Number: [id number] Patient Account Number: [id number] Date of Birth/Sex: Treating RN: Mar 25, 1941 (81 y.o. [name], [name] Primary Care Provider: Juline [name] Other Clinician: Referring Provider: Treating Provider/Extender: [name] in Treatment: 4 Debridement Performed for Assessment: Wound #1 Right,Anterior Lower Leg Performed By: Physician Tara Maudlin, MD Debridement Type: Debridement Level of Consciousness (Pre-procedure): Awake and Alert Pre-procedure Verification/Time Out Yes - 09:59 Taken: Start Time: 10:00 T Area Debrided (L x W): otal 0.3 (cm) x 0.3 (cm) = 0.09 (cm) Tissue and other material debrided: Non-Viable, Eschar Level: Non-Viable Tissue Debridement Description: Selective/Open Wound Instrument: Curette Bleeding: Minimum Hemostasis Achieved: Pressure Response to Treatment: Procedure was tolerated well Level of Consciousness (Post- Awake and Alert procedure): Post Debridement Measurements of Total Wound Length: (cm)  0.3 Width: (cm) 0.3 Depth: (cm) 0.1 Volume: (cm) 0.007 Character of Wound/Ulcer Post Debridement: Requires Further Debridement Post Procedure Diagnosis Same as Pre-procedure Notes Tara, [name] ([id number]) [id number] Page 2 of 9 Scribed for Dr. Celine [name] by Tara East, RN Electronic Signature(s) Signed: 04/04/2022 10:14:13 AM By: Tara Maudlin MD FACS Signed: 04/08/2022 5:15:19 PM By: Tara East RN Entered By: [name] on 04/04/2022 10:01:16 -------------------------------------------------------------------------------- HPI Details Patient Name: Date of Service: Tara NDO N, [name] 04/04/2022 9:45 A M Medical Record Number: [id number] Patient Account Number: [id number] Date of Birth/Sex: Treating RN: 19-Jan-1941 (81 y.o. F) Primary Care Provider: Juline [name] Other Clinician: Referring Provider: Treating Provider/Extender: [name] in Treatment: 4 History of Present Illness HPI Description: ADMISSION 03/03/2022 This is an 81 year old woman with a past medical history significant for scleroderma, interstitial lung disease, [name] syndrome, chronic steroid use, hereditary hemochromatosis cytosis and emphysema. She is chronically on corticosteroids as well as mycophenolate. She was visiting her brother in [place] when she fell walking up the steps to the house. She suffered a laceration to her right anterior lower leg. She was seen at a local healthcare facility. The wound was dressed and she was discharged. She has returned to [place] and was referred to the wound care center for further evaluation and management. She and her daughter have been caring for it at home. They have been applying a nonadherent gauze over the wound and wrapping it with Kerlix and Coban. There is a U-shaped wound on the patient's anterior tibial surface. The wound edges are actually secured with Steri-Strips and there is no  exposed tissue. There does appear to have been some serous drainage that has crusted around the edges of the wound but there is no sign or concern for infection. Just proximal to the main wound, she has a smaller abrasion that does get down into the fat layer. There is a little bit of slough and eschar present at this site. 03/09/2022: Much of the  wound has healed. The Steri-Strips were removed to reveal just a small open portion at the most distal part of the wound. There is slough accumulation. No concern for infection. 03/16/2022: There is just a small open portion of the wound. It has a little slough accumulation. She was complaining that the areas where the wound is healing and there is eschar, she is noticing a pulling or tight sensation. 03/26/2019: The open portion at the most caudal aspect of her wound is red, swollen, and more tender today. There is a bit of slough accumulation, but no purulent drainage. There is a linear opening area at the most proximal and lateral part of the wound with some slough accumulation; the rest of the wound has healed. 04/04/2022: The swelling and tenderness that were present last visit have resolved. There is just a small residual opening at the most distal portion of her wound with some thin eschar overlying it. Everything else has healed. Electronic Signature(s) Signed: 04/04/2022 10:07:21 AM By: Tara Maudlin MD FACS Entered By: [name] on 04/04/2022 10:07:21 -------------------------------------------------------------------------------- Physical Exam Details Patient Name: Date of Service: Tara NDO N, [name] 04/04/2022 9:45 A M Medical Record Number: [id number] Patient Account Number: [id number] Date of Birth/Sex: Treating RN: 10-31-1940 (81 y.o. F) Primary Care Provider: Juline [name] Other Clinician: Referring Provider: Treating Provider/Extender: [name] in Treatment: 4 Constitutional . . . . No acute  distress.Marland Kitchen Respiratory Normal work of breathing on room air.[name] ([id number]) [id number] Page 3 of 9 Notes 04/04/2022: The swelling and tenderness that were present last visit have resolved. There is just a small residual opening at the most distal portion of her wound with some thin eschar overlying it. Everything else has healed. Electronic Signature(s) Signed: 04/04/2022 10:08:09 AM By: Tara Maudlin MD FACS Entered By: [name] on 04/04/2022 10:08:09 -------------------------------------------------------------------------------- Physician Orders Details Patient Name: Date of Service: Tara NDO N, [name] 04/04/2022 9:45 A M Medical Record Number: [id number] Patient Account Number: [id number] Date of Birth/Sex: Treating RN: Aug 29, 1940 (81 y.o. [name], [name] Primary Care Provider: Juline [name] Other Clinician: Referring Provider: Treating Provider/Extender: [name] in Treatment: 4 Verbal / Phone Orders: No Diagnosis Coding ICD-10 Coding Code Description [phone] Non-pressure chronic ulcer of other part of right lower leg with fat layer exposed I73.00 [name] syndrome without gangrene E83.110 Hereditary hemochromatosis J84.9 Interstitial pulmonary disease, unspecified J43.9 Emphysema, unspecified Z79.899 Other long term (current) drug therapy Follow-up Appointments ppointment in 1 week. - Dr. Celine [name] Rm 4 Return A Monday 04/11/22 at 8:45 AM Bathing/ Shower/ Hygiene May shower with protection but do not get wound dressing(s) wet. - Wear cast protector, [facility] Wound Treatment Wound #1 - Lower Leg Wound Laterality: Right, Anterior Cleanser: Soap and Water Discharge Instructions: May shower and wash wound with dial antibacterial soap and water prior to dressing change. Peri-Wound Care: Sween Lotion (Moisturizing lotion) Discharge Instructions: Apply moisturizing lotion as  directed Prim Dressing: KerraCel Ag Gelling Fiber Dressing, 4x5 in (silver alginate) ary Discharge Instructions: Apply silver alginate to wound bed as instructed Secondary Dressing: T Non-Adherent Dressing, 3x4 in elfa Discharge Instructions: Apply over primary dressing as directed. Secondary Dressing: Zetuvit Plus 4x8 in Discharge Instructions: Apply over primary dressing as directed. Secured With: Coban Self-Adherent Wrap 4x5 (in/yd) Discharge Instructions: Secure with Coban as directed. Secured With: [employer], 4.5x3.1 (in/yd) Discharge Instructions: Secure with Kerlix as directed. Secured With: 83M Medipore H Soft  Cloth Surgical T ape, 4 x 10 (in/yd) Discharge Instructions: Secure with tape as directed. KASSITY, WOODSON ([id number]) [id number] Page 4 of 9 Electronic Signature(s) Signed: 04/04/2022 12:20:31 PM By: Tara Maudlin MD FACS Signed: 04/08/2022 5:15:19 PM By: Tara East RN Previous Signature: 04/04/2022 10:08:33 AM Version By: Tara Maudlin MD FACS Entered By: [name] on 04/04/2022 10:15:56 -------------------------------------------------------------------------------- Problem List Details Patient Name: Date of Service: Tara NDO N, [name] 04/04/2022 9:45 A M Medical Record Number: [id number] Patient Account Number: [id number] Date of Birth/Sex: Treating RN: June 29, 1940 (81 y.o. F) Primary Care Provider: Juline [name] Other Clinician: Referring Provider: Treating Provider/Extender: [name] in Treatment: 4 Active Problems ICD-10 Encounter Code Description Active Date MDM Diagnosis [id number] Non-pressure chronic ulcer of other part of right lower leg with fat layer 03/03/2022 No Yes exposed I73.00 [name] syndrome without gangrene 03/03/2022 No Yes E83.110 Hereditary hemochromatosis 03/03/2022 No Yes J84.9 Interstitial pulmonary disease, unspecified 03/03/2022 No Yes J43.9 Emphysema,  unspecified 03/03/2022 No Yes Z79.899 Other long term (current) drug therapy 03/03/2022 No Yes Inactive Problems Resolved Problems Electronic Signature(s) Signed: 04/04/2022 10:05:52 AM By: Tara Maudlin MD FACS Entered By: [name] on 04/04/2022 10:05:52 -------------------------------------------------------------------------------- Progress Note Details Patient Name: Date of Service: Tara NDO N, [name] 04/04/2022 9:45 A M Medical Record Number: [id number] Patient Account Number: [id number] Date of Birth/Sex: Treating RN: 11-14-1940 (81 y.o. Elzabeth Mcquerry, [name] ([id number]) [id number] Page 5 of 9 Primary Care Provider: Juline [name] Other Clinician: Referring Provider: Treating Provider/Extender: [name] in Treatment: 4 Subjective Chief Complaint Information obtained from Patient Patient seen for complaints of Non-Healing Wound. History of Present Illness (HPI) ADMISSION 03/03/2022 This is an 81 year old woman with a past medical history significant for scleroderma, interstitial lung disease, [name] syndrome, chronic steroid use, hereditary hemochromatosis cytosis and emphysema. She is chronically on corticosteroids as well as mycophenolate. She was visiting her brother in [place] when she fell walking up the steps to the house. She suffered a laceration to her right anterior lower leg. She was seen at a local healthcare facility. The wound was dressed and she was discharged. She has returned to [place] and was referred to the wound care center for further evaluation and management. She and her daughter have been caring for it at home. They have been applying a nonadherent gauze over the wound and wrapping it with Kerlix and Coban. There is a U-shaped wound on the patient's anterior tibial surface. The wound edges are actually secured with Steri-Strips and there is no exposed tissue. There does appear to  have been some serous drainage that has crusted around the edges of the wound but there is no sign or concern for infection. Just proximal to the main wound, she has a smaller abrasion that does get down into the fat layer. There is a little bit of slough and eschar present at this site. 03/09/2022: Much of the wound has healed. The Steri-Strips were removed to reveal just a small open portion at the most distal part of the wound. There is slough accumulation. No concern for infection. 03/16/2022: There is just a small open portion of the wound. It has a little slough accumulation. She was complaining that the areas where the wound is healing and there is eschar, she is noticing a pulling or tight sensation. 03/26/2019: The open portion at the most caudal aspect of her wound is red, swollen, and more tender today. There is a  bit of slough accumulation, but no purulent drainage. There is a linear opening area at the most proximal and lateral part of the wound with some slough accumulation; the rest of the wound has healed. 04/04/2022: The swelling and tenderness that were present last visit have resolved. There is just a small residual opening at the most distal portion of her wound with some thin eschar overlying it. Everything else has healed. Patient History Information obtained from Patient. Family History Diabetes - Siblings, Heart Disease - Father, Hypertension - Mother, Kidney Disease - Father, Stroke - Mother, No family history of Cancer, Hereditary Spherocytosis, Lung Disease, Seizures, Thyroid Problems, Tuberculosis. Social History Former smoker - quit 25 yrs ago, Marital Status - Widowed, Alcohol Use - Moderate, Drug Use - No History, Caffeine Use - Daily - coffee. Medical History Eyes Patient has history of Cataracts - bil removed Ear/Nose/Mouth/Throat Denies history of Chronic sinus problems/congestion, Middle ear problems Respiratory Patient has history of Chronic Obstructive  Pulmonary Disease (COPD) - mild Denies history of Aspiration, Asthma, Pneumothorax, Sleep Apnea, Tuberculosis Cardiovascular Patient has history of Arrhythmia - afib Endocrine Denies history of Type I Diabetes, Type II Diabetes Genitourinary Denies history of End Stage Renal Disease Immunological Patient has history of Raynaudoos, Scleroderma - lungs Denies history of Lupus Erythematosus Integumentary (Skin) Denies history of History of Burn Oncologic Denies history of Received Chemotherapy, Received Radiation Psychiatric Patient has history of Confinement Anxiety Denies history of Anorexia/bulimia Hospitalization/Surgery History - mouth surgery. - left shouilder surgery. - total abdominal hysterectomy. Medical A Surgical History Notes nd Ear/Nose/Mouth/Throat hearing loss, uses hearing aids Hematologic/Lymphatic hemochromatosis Respiratory pulmonary fibrosis, emphysema Endocrine hypothyroidism Tara, [name] ([id number]) [id number] Page 6 of 9 Objective Constitutional No acute distress.. Vitals Time Taken: 9:45 AM, Weight: 142 lbs, Temperature: 98.2 F, Pulse: 65 bpm, Respiratory Rate: 16 breaths/min, Blood Pressure: 127/62 mmHg. Respiratory Normal work of breathing on room air.. General Notes: 04/04/2022: The swelling and tenderness that were present last visit have resolved. There is just a small residual opening at the most distal portion of her wound with some thin eschar overlying it. Everything else has healed. Integumentary (Hair, Skin) Wound #1 status is Open. Original cause of wound was Trauma. The date acquired was: 02/17/2022. The wound has been in treatment 4 weeks. The wound is located on the Right,Anterior Lower Leg. The wound measures 0.3cm length x 0.3cm width x 0.1cm depth; 0.071cm^2 area and 0.007cm^3 volume. There is Fat Layer (Subcutaneous Tissue) exposed. There is no tunneling or undermining noted. There is a medium amount  of serosanguineous drainage noted. The wound margin is flat and intact. There is large (67-100%) red granulation within the wound bed. There is a small (1-33%) amount of necrotic tissue within the wound bed including Eschar. The periwound skin appearance exhibited: Dry/Scaly. The periwound skin appearance did not exhibit: Callus, Crepitus, Excoriation, Induration, Rash. Assessment Active Problems ICD-10 Non-pressure chronic ulcer of other part of right lower leg with fat layer exposed [name] syndrome without gangrene Hereditary hemochromatosis Interstitial pulmonary disease, unspecified Emphysema, unspecified Other long term (current) drug therapy Procedures Wound #1 Pre-procedure diagnosis of Wound #1 is a Skin T located on the Right,Anterior Lower Leg . There was a Selective/Open Wound Non-Viable Tissue ear Debridement with a total area of 0.09 sq cm performed by Tara Maudlin, MD. With the following instrument(s): Curette to remove Non-Viable tissue/material. Material removed includes Eschar. No specimens were taken. A time out was conducted at 09:59, prior to the start of the procedure. A Minimum  amount of bleeding was controlled with Pressure. The procedure was tolerated well. Post Debridement Measurements: 0.3cm length x 0.3cm width x 0.1cm depth; 0.007cm^3 volume. Character of Wound/Ulcer Post Debridement requires further debridement. Post procedure Diagnosis Wound #1: Same as Pre-Procedure General Notes: Scribed for Dr. Celine [name] by Tara East, RN. Plan Follow-up Appointments: Return Appointment in 1 week. - Dr. Celine [name] Rm 4 Monday 04/11/22 at 8:45 AM Bathing/ Shower/ Hygiene: May shower with protection but do not get wound dressing(s) wet. - W[facility] protector[facility] WOUND #1: - Lower Leg Wound Laterality: Right, Anterior Cleanser: Soap and Water Discharge Instructions: May shower and wash wound with dial antibacterial soap and water prior to dressing  change. Peri-Wound Care: Sween Lotion (Moisturizing lotion) Discharge Instructions: Apply moisturizing lotion as directed Prim Dressing: KerraCel Ag Gelling Fiber Dressing, 4x5 in (silver alginate) ary Discharge Instructions: Apply silver alginate to wound bed as instructed Secondary Dressing: T Non-Adherent Dressing, 3x4 in elfa Discharge Instructions: Apply over primary dressing as directed. Secondary Dressing: Zetuvit Plus 4x8 in Discharge Instructions: Apply over primary dressing as directed. Secured With: Coban Self-Adherent Wrap 4x5 (in/yd) Discharge Instructions: Secure with Coban as directed. Secured With: [employer], 4.5x3.1 (in/yd) Discharge Instructions: Secure with Kerlix as directed. Secured With: 14M Medipore H Soft Cloth Surgical T ape, 4 x 10 (in/yd) Discharge Instructions: Secure with tape as directed. JERLEAN, PERALTA ([id number]) [id number] Page 7 of 9 04/04/2022: The swelling and tenderness that were present last visit have resolved. There is just a small residual opening at the most distal portion of her wound with some thin eschar overlying it. Everything else has healed. I used a curette to debride the eschar from the wound. She has completed her course of oral cephalexin. We will continue to use topical mupirocin ointment with silver alginate and Kerlix and Coban wrapping. Follow-up in 1 week, at which time I anticipate she is likely to be completely healed. Electronic Signature(s) Signed: 04/04/2022 12:20:31 PM By: Tara Maudlin MD FACS Signed: 04/08/2022 5:15:19 PM By: Tara East RN Previous Signature: 04/04/2022 10:09:08 AM Version By: Tara Maudlin MD FACS Entered By: [name] on 04/04/2022 10:16:14 -------------------------------------------------------------------------------- HxROS Details Patient Name: Date of Service: Tara NDO N, [name] 04/04/2022 9:45 A M Medical Record Number: [id number] Patient  Account Number: [id number] Date of Birth/Sex: Treating RN: [birth year] (81 y.o. F) Primary Care Provider: Juline [name] Other Clinician: Referring Provider: Treating Provider/Extender: [name] in Treatment: 4 Information Obtained From Patient Eyes Medical History: Positive for: Cataracts - bil removed Ear/Nose/Mouth/Throat Medical History: Negative for: Chronic sinus problems/congestion; Middle ear problems Past Medical History Notes: hearing loss, uses hearing aids Hematologic/Lymphatic Medical History: Past Medical History Notes: hemochromatosis Respiratory Medical History: Positive for: Chronic Obstructive Pulmonary Disease (COPD) - mild Negative for: Aspiration; Asthma; Pneumothorax; Sleep Apnea; Tuberculosis Past Medical History Notes: pulmonary fibrosis, emphysema Cardiovascular Medical History: Positive for: Arrhythmia - afib Endocrine Medical History: Negative for: Type I Diabetes; Type II Diabetes Past Medical History Notes: hypothyroidism Genitourinary Medical History: Negative for: End Stage Renal Disease KEVINA, PILOTO ([id number]) [id number] Page 8 of 9 Immunological Medical History: Positive for: Raynauds; Scleroderma - lungs Negative for: Lupus Erythematosus Integumentary (Skin) Medical History: Negative for: History of Burn Oncologic Medical History: Negative for: Received Chemotherapy; Received Radiation Psychiatric Medical History: Positive for: Confinement Anxiety Negative for: Anorexia/bulimia HBO Extended History Items Eyes: Cataracts Immunizations Pneumococcal Vaccine: Received Pneumococcal Vaccination: Yes Received Pneumococcal Vaccination On or After 60th Birthday: Yes Implantable  Devices None Hospitalization / Surgery History Type of Hospitalization/Surgery mouth surgery left shouilder surgery total abdominal hysterectomy Family and Social History Cancer: No;  Diabetes: Yes - Siblings; Heart Disease: Yes - Father; Hereditary Spherocytosis: No; Hypertension: Yes - Mother; Kidney Disease: Yes - Father; Lung Disease: No; Seizures: No; Stroke: Yes - Mother; Thyroid Problems: No; Tuberculosis: No; Former smoker - quit 25 yrs ago; Marital Status - Widowed; Alcohol Use: Moderate; Drug Use: No History; Caffeine Use: Daily - coffee; Financial Concerns: No; Food, Clothing or Shelter Needs: No; Support System Lacking: No; Transportation Concerns: No Electronic Signature(s) Signed: 04/04/2022 10:14:13 AM By: Tara Maudlin MD FACS Entered By: [name] on 04/04/2022 10:07:46 -------------------------------------------------------------------------------- SuperBill Details Patient Name: Date of Service: Tara NDO N, [name] 04/04/2022 Medical Record Number: [id number] Patient Account Number: [id number] Date of Birth/Sex: Treating RN: 02/19/1941 (81 y.o. F) Primary Care Provider: Juline [name] Other Clinician: Referring Provider: Treating Provider/Extender: [name] in Treatment: 4 Diagnosis Coding ICD-10 Codes Code Description [phone] Non-pressure chronic ulcer of other part of right lower leg with fat layer exposed I73.00 [name] syndrome without gangrene JOSEPHENE, MARRONE ([id number]) [id number] Page 9 of 9 E83.110 Hereditary hemochromatosis J84.9 Interstitial pulmonary disease, unspecified J43.9 Emphysema, unspecified Z79.899 Other long term (current) drug therapy Facility Procedures : CPT4 Code: [id number] Description: 63785 - DEBRIDE WOUND 1ST 20 SQ CM OR < ICD-10 Diagnosis Description Y85.027 Non-pressure chronic ulcer of other part of right lower leg with fat layer expos Modifier: ed Quantity: 1 Physician Procedures : CPT4 Code Description Modifier [id number] - WC PHYS LEVEL 4 - EST PT 25 ICD-10 Diagnosis Description M76.720 Non-pressure chronic ulcer of other part of right  lower leg with fat layer exposed J43.9 Emphysema, unspecified Z79.899 Other long term  (current) drug therapy I73.00 [name] syndrome without gangrene Quantity: 1 : [id number] 83662 - WC PHYS DEBR WO ANESTH 20 SQ CM ICD-10 Diagnosis Description H47.654 Non-pressure chronic ulcer of other part of right lower leg with fat layer exposed Quantity: 1 Electronic Signature(s) Signed: 04/04/2022 10:09:28 AM By: Tara Maudlin MD FACS Entered By: [name] on 04/04/2022 10:09:28

## 2022-04-08 NOTE — Progress Notes (Signed)
[name], [name] ([id number]) [id number] Page 1 of 7 Visit Report for 04/04/2022 Arrival Information Details Patient Name: Date of Service: [name], [name] [name]. 04/04/2022 9:45 A M Medical Record Number: [id number] Patient Account Number: [id number] Date of Birth/Sex: Treating [name]: [birth year] (81 y.o. [name], [name] Primary Care [name]: [name] Other Clinician: Referring [name]: Treating [name]/Extender: [name] in Treatment: 4 Visit Information History Since Last Visit All ordered tests and consults were completed: Yes Patient Arrived: Ambulatory Added or deleted any medications: No Arrival Time: 09:44 Any new allergies or adverse reactions: No Accompanied By: self Had a fall or experienced change in No Transfer Assistance: None activities of daily living that may affect Patient Has Alerts: Yes risk of falls: Patient Alerts: Patient on Blood Thinner Signs or symptoms of abuse/neglect since last visito No ABI RLE 1.07 Hospitalized since last visit: No Implantable device outside of the clinic excluding No cellular tissue based products placed in the center since last visit: Has Dressing in Place as Prescribed: Yes Has Compression in Place as Prescribed: Yes Pain Present Now: No Electronic Signature(s) Signed: 04/08/2022 5:15:19 PM By: [name] Entered By: Tara East on 04/04/2022 09:45:53 -------------------------------------------------------------------------------- Encounter Discharge Information Details Patient Name: Date of Service: Tara NDO [name], Tara Tara J. 04/04/2022 9:45 A M Medical Record Number: [id number] Patient Account Number: [id number] Date of Birth/Sex: Treating [name]: [birth year] (81 y.o. [name] Primary Care [name]: [name] Other Clinician: Referring [name]: Treating [name]/Extender: [name] in Treatment: 4 Encounter Discharge  Information Items Post Procedure Vitals Discharge Condition: Stable Temperature (F): 98.2 Ambulatory Status: Ambulatory Pulse (bpm): 65 Discharge Destination: Home Respiratory Rate (breaths/min): 16 Transportation: Private Auto Blood Pressure (mmHg): 127/62 Accompanied By: self Schedule Follow-up Appointment: Yes Clinical Summary of Care: Electronic Signature(s) Signed: 04/08/2022 5:15:19 PM By: [name] Entered By: Tara East on 04/04/2022 10:10:59 Bleier, [name] ([id number]) [id number] Page 2 of 7 -------------------------------------------------------------------------------- Lower Extremity Assessment Details Patient Name: Date of Service: Tara NDO [name], Tara Tara J. 04/04/2022 9:45 A M Medical Record Number: [id number] Patient Account Number: [id number] Date of Birth/Sex: Treating [name]: 1941-05-24 (81 y.o. [name] Primary Care Illias Pantano: [name] Other Clinician: Referring [name]: Treating [name]/Extender: [name] in Treatment: 4 Edema Assessment Assessed: [Left: No] [Right: No] Edema: [Left: Ye] [Right: s] Calf Left: Right: Point of Measurement: From Medial Instep 32 cm Ankle Left: Right: Point of Measurement: From Medial Instep 19.5 cm Vascular Assessment Pulses: Dorsalis Pedis Palpable: [Right:Yes] Electronic Signature(s) Signed: 04/08/2022 5:15:19 PM By: [name] Entered By: Tara East on 04/04/2022 09:52:17 -------------------------------------------------------------------------------- Multi Wound Chart Details Patient Name: Date of Service: Tara NDO [name], Tara Tara J. 04/04/2022 9:45 A M Medical Record Number: [id number] Patient Account Number: [id number] Date of Birth/Sex: Treating [name]: 06-Dec-1940 (81 y.o. F) Primary Care [name]: [name] Other Clinician: Referring [name]: Treating [name]/Extender: [name] in Treatment: 4 Vital  Signs Height(in): Pulse(bpm): 65 Weight(lbs): 142 Blood Pressure(mmHg): 127/62 Body Mass Index(BMI): Temperature(F): 98.2 Respiratory Rate(breaths/min): 16 [1:Photos:] [[name]/A:[name]/A] Right, Anterior Lower Leg [name]/A [name]/A Wound Location: Trauma [name]/A [name]/A Wounding Event: [name], [name] ([id number]) [id number] Page 3 of 7 Skin T ear [name]/A [name]/A Primary Etiology: Cataracts, Chronic Obstructive [name]/A [name]/A Comorbid History: Pulmonary Disease (COPD), Arrhythmia, Raynauds, Scleroderma, Confinement Anxiety 02/17/2022 [name]/A [name]/A Date Acquired: 4 [name]/A [name]/A Weeks of Treatment: Open [name]/A [name]/A Wound Status: No [name]/A [name]/A  Wound Recurrence: 0.3x0.3x0.1 [name]/A [name]/A Measurements L x W x D (cm) 0.071 [name]/A [name]/A A (cm) : rea 0.007 [name]/A [name]/A Volume (cm) : 99.80% [name]/A [name]/A % Reduction in A rea: 99.80% [name]/A [name]/A % Reduction in Volume: Full Thickness Without Exposed [name]/A [name]/A Classification: Support Structures Medium [name]/A [name]/A Exudate A mount: Serosanguineous [name]/A [name]/A Exudate Type: red, brown [name]/A [name]/A Exudate Color: Flat and Intact [name]/A [name]/A Wound Margin: Large (67-100%) [name]/A [name]/A Granulation A mount: Red [name]/A [name]/A Granulation Quality: Small (1-33%) [name]/A [name]/A Necrotic A mount: Eschar [name]/A [name]/A Necrotic Tissue: Fat Layer (Subcutaneous Tissue): Yes [name]/A [name]/A Exposed Structures: Fascia: No Tendon: No Muscle: No Joint: No Bone: No Large (67-100%) [name]/A [name]/A Epithelialization: Debridement - Selective/Open Wound [name]/A [name]/A Debridement: Pre-procedure Verification/Time Out 09:59 [name]/A [name]/A Taken: Necrotic/Eschar [name]/A [name]/A Tissue Debrided: Non-Viable Tissue [name]/A [name]/A Level: 0.09 [name]/A [name]/A Debridement A (sq cm): rea Curette [name]/A [name]/A Instrument: Minimum [name]/A [name]/A Bleeding: Pressure [name]/A [name]/A Hemostasis A chieved: Procedure was tolerated well [name]/A [name]/A Debridement Treatment Response: 0.3x0.3x0.1 [name]/A [name]/A Post Debridement Measurements L x W x D (cm) 0.007 [name]/A [name]/A Post Debridement Volume: (cm) Excoriation: No  [name]/A [name]/A Periwound Skin Texture: Induration: No Callus: No Crepitus: No Rash: No Dry/Scaly: Yes [name]/A [name]/A Periwound Skin Moisture: Debridement [name]/A [name]/A Procedures Performed: Treatment Notes Electronic Signature(s) Signed: 04/04/2022 10:05:57 AM By: [name] Entered By: Tara Maudlin on 04/04/2022 10:05:57 -------------------------------------------------------------------------------- Multi-Disciplinary Care Plan Details Patient Name: Date of Service: Tara NDO [name], Tara Tara J. 04/04/2022 9:45 A M Medical Record Number: [id number] Patient Account Number: [id number] Date of Birth/Sex: Treating [name]: 02-11-1941 (81 y.o. [name], [name] Primary Care Massimiliano Rohleder: [name] Other Clinician: Referring Novalie Leamy: Treating Alvira Hecht/Extender: [name] in Treatment: 4 Active Inactive Abuse / Safety / Falls / [place] Management [name], [name] ([id number]) [id number] Page 4 of 7 Nursing Diagnoses: Potential for injury related to falls Goals: Patient will remain injury free related to falls Date Initiated: 03/03/2022 Target Resolution Date: 05/02/2022 Goal Status: Active Interventions: Provide education on fall prevention Notes: Orientation to the Wound Care Program Nursing Diagnoses: Knowledge deficit related to the wound healing center program Goals: Patient/caregiver will verbalize understanding of the [place] Program Date Initiated: 03/03/2022 Target Resolution Date: 05/02/2022 Goal Status: Active Interventions: Provide education on orientation to the wound center Notes: Wound/Skin Impairment Nursing Diagnoses: Impaired tissue integrity Goals: Ulcer/skin breakdown will have a volume reduction of 30% by week 4 Date Initiated: 03/03/2022 Date Inactivated: 04/04/2022 Target Resolution Date: 03/31/2022 Goal Status: Met Ulcer/skin breakdown will have a volume reduction of 50% by week 8 Date Initiated:  04/04/2022 Target Resolution Date: 05/02/2022 Goal Status: Active Interventions: Assess patient/caregiver ability to obtain necessary supplies Assess ulceration(s) every visit Treatment Activities: Skin care regimen initiated : 03/03/2022 Notes: Electronic Signature(s) Signed: 04/08/2022 5:15:19 PM By: [name] Entered By: Tara East on 04/04/2022 09:54:04 -------------------------------------------------------------------------------- Pain Assessment Details Patient Name: Date of Service: Tara NDO [name], Tara Tara J. 04/04/2022 9:45 A M Medical Record Number: [id number] Patient Account Number: [id number] Date of Birth/Sex: Treating [name]: 09-18-1940 (81 y.o. [name] Primary Care Levan Aloia: [name] Other Clinician: Referring Chandell Attridge: Treating Dilcia Rybarczyk/Extender: [name] in Treatment: 4 Active Problems Location of Pain Severity and Description of Pain Patient Has Paino No Site Locations Rate the pain. DAISIE, HAFT ([id number]) [id number] Page 5 of 7 Rate the pain. Current Pain Level: 0 Pain Management and Medication Current Pain Management:  Electronic Signature(s) Signed: 04/08/2022 5:15:19 PM By: [name] Entered By: Tara East on 04/04/2022 09:47:06 -------------------------------------------------------------------------------- Patient/Caregiver Education Details Patient Name: Date of Service: Tara NDO [name], Tara Tara J. 10/9/2023andnbsp9:45 A M Medical Record Number: [id number] Patient Account Number: [id number] Date of Birth/Gender: Treating [name]: July 09, 1940 (81 y.o. [name] Primary Care Physician: [name] Other Clinician: Referring Physician: Treating Physician/Extender: [name] in Treatment: 4 Education Assessment Education Provided To: Patient Education Topics Provided Safety: Methods: Explain/Verbal Responses: Reinforcements needed,  State content correctly [place]: o Methods: Explain/Verbal Responses: Reinforcements needed, State content correctly Electronic Signature(s) Signed: 04/08/2022 5:15:19 PM By: [name] Entered By: Tara East on 04/04/2022 09:54:24 -------------------------------------------------------------------------------- Wound Assessment Details Patient Name: Date of Service: Tara NDO [name], Tara Tara J. 04/04/2022 9:45 A Charmian Muff ([id number]) [id number] Page 6 of 7 Medical Record Number: [id number] Patient Account Number: [id number] Date of Birth/Sex: Treating [name]: 1940/09/16 (81 y.o. [name], [name] Primary Care Shron Ozer: [name] Other Clinician: Referring Quintus Premo: Treating Tylie Golonka/Extender: [name] in Treatment: 4 Wound Status Wound Number: 1 Primary Skin T ear Etiology: Wound Location: Right, Anterior Lower Leg Wound Open Wounding Event: Trauma Status: Date Acquired: 02/17/2022 Comorbid Cataracts, Chronic Obstructive Pulmonary Disease (COPD), Weeks Of Treatment: 4 History: Arrhythmia, Raynauds, Scleroderma, Confinement Anxiety Clustered Wound: No Photos Wound Measurements Length: (cm) 0.3 Width: (cm) 0.3 Depth: (cm) 0.1 Area: (cm) 0.071 Volume: (cm) 0.007 % Reduction in Area: 99.8% % Reduction in Volume: 99.8% Epithelialization: Large (67-100%) Tunneling: No Undermining: No Wound Description Classification: Full Thickness Without Exposed Support Structures Wound Margin: Flat and Intact Exudate Amount: Medium Exudate Type: Serosanguineous Exudate Color: red, brown Foul Odor After Cleansing: No Slough/Fibrino Yes Wound Bed Granulation Amount: Large (67-100%) Exposed Structure Granulation Quality: Red Fascia Exposed: No Necrotic Amount: Small (1-33%) Fat Layer (Subcutaneous Tissue) Exposed: Yes Necrotic Quality: Eschar Tendon Exposed: No Muscle Exposed: No Joint  Exposed: No Bone Exposed: No Periwound Skin Texture Texture Color No Abnormalities Noted: No No Abnormalities Noted: No Callus: No Crepitus: No Excoriation: No Induration: No Rash: No Moisture No Abnormalities Noted: No Dry / Scaly: Yes Treatment Notes Wound #1 (Lower Leg) Wound Laterality: Right, Anterior Cleanser Soap and Water Discharge Instruction: May shower and wash wound with dial antibacterial soap and water prior to dressing change. Peri-Wound Care Sween Lotion (Moisturizing lotion) KHALEELAH, YOWELL ([id number]) [id number] Page 7 of 7 Discharge Instruction: Apply moisturizing lotion as directed Topical Mupirocin Ointment Discharge Instruction: Apply Mupirocin (Bactroban) as instructed Primary Dressing KerraCel Ag Gelling Fiber Dressing, 4x5 in (silver alginate) Discharge Instruction: Apply silver alginate to wound bed as instructed Secondary Dressing T Non-Adherent Dressing, 3x4 in elfa Discharge Instruction: Apply over primary dressing as directed. Zetuvit Plus 4x8 in Discharge Instruction: Apply over primary dressing as directed. Secured With [employer] 4x5 (in/yd) Discharge Instruction: Secure with Coban as directed. Kerlix Roll Sterile, 4.5x3.1 (in/yd) Discharge Instruction: Secure with Kerlix as directed. 28M Medipore H Soft Cloth Surgical T ape, 4 x 10 (in/yd) Discharge Instruction: Secure with tape as directed. Compression Wrap Compression Stockings Add-Ons Electronic Signature(s) Signed: 04/08/2022 5:15:19 PM By: [name] Entered By: Tara East on 04/04/2022 10:01:32 -------------------------------------------------------------------------------- Vitals Details Patient Name: Date of Service: Tara NDO [name], Tara Tara J. 04/04/2022 9:45 A M Medical Record Number: [id number] Patient Account Number: [id number] Date of Birth/Sex: Treating [name]: 1940-07-23 (81 y.o. [name] Primary Care Leylah Tarnow:  [name]  Other Clinician: Referring Aaliya Maultsby: Treating Yahsir Wickens/Extender: [name] in Treatment: 4 Vital Signs Time Taken: 09:45 Temperature (F): 98.2 Weight (lbs): 142 Pulse (bpm): 65 Respiratory Rate (breaths/min): 16 Blood Pressure (mmHg): 127/62 Reference Range: 80 - 120 mg / dl Electronic Signature(s) Signed: 04/08/2022 5:15:19 PM By: [name] Entered By: Tara East on 04/04/2022 09:46:56

## 2022-04-11 ENCOUNTER — Encounter (HOSPITAL_BASED_OUTPATIENT_CLINIC_OR_DEPARTMENT_OTHER): Payer: Medicare Other | Admitting: General Surgery

## 2022-04-11 DIAGNOSIS — Z09 Encounter for follow-up examination after completed treatment for conditions other than malignant neoplasm: Secondary | ICD-10-CM | POA: Diagnosis not present

## 2022-04-11 NOTE — Progress Notes (Signed)
FE, [place] ([id number]) [id number] Page 1 of 7 Visit Report for 04/11/2022 Chief Complaint Document Details Patient Name: Date of Service: [name], [name]. 04/11/2022 8:45 A M Medical Record Number: [id number] Patient Account Number: [id number] Date of Birth/Sex: Treating RN: April 14, 1941 (81 y.o. F) Primary Care Provider: Juline Patch Other Clinician: Referring Provider: Treating Provider/Extender: Manson Allan in Treatment: 5 Information Obtained from: Patient Chief Complaint Patient seen for complaints of Non-Healing Wound. Electronic Signature(s) Signed: 04/11/2022 6:10:12 AM By: Fredirick Maudlin MD FACS Entered By: Fredirick Maudlin on 04/11/2022 09:10:12 -------------------------------------------------------------------------------- HPI Details Patient Name: Date of Service: [name], [name]. 04/11/2022 8:45 A M Medical Record Number: [id number] Patient Account Number: [id number] Date of Birth/Sex: Treating RN: [birth year] (81 y.o. F) Primary Care Provider: Juline Patch Other Clinician: Referring Provider: Treating Provider/Extender: Manson Allan in Treatment: 5 History of Present Illness HPI Description: ADMISSION 03/03/2022 This is an 81 year old woman with a past medical history significant for scleroderma, interstitial lung disease, [name] syndrome, chronic steroid use, hereditary hemochromatosis cytosis and emphysema. She is chronically on corticosteroids as well as mycophenolate. She was visiting her brother in [place] when she fell walking up the steps to the house. She suffered a laceration to her right anterior lower leg. She was seen at a local healthcare facility. The wound was dressed and she was discharged. She has returned to [place] and was referred to the wound care center for further evaluation and management. She and her daughter have been caring for it  at home. They have been applying a nonadherent gauze over the wound and wrapping it with Kerlix and Coban. There is a U-shaped wound on the patient's anterior tibial surface. The wound edges are actually secured with Steri-Strips and there is no exposed tissue. There does appear to have been some serous drainage that has crusted around the edges of the wound but there is no sign or concern for infection. Just proximal to the main wound, she has a smaller abrasion that does get down into the fat layer. There is a little bit of slough and eschar present at this site. 03/09/2022: Much of the wound has healed. The Steri-Strips were removed to reveal just a small open portion at the most distal part of the wound. There is slough accumulation. No concern for infection. 03/16/2022: There is just a small open portion of the wound. It has a little slough accumulation. She was complaining that the areas where the wound is healing and there is eschar, she is noticing a pulling or tight sensation. 03/26/2019: The open portion at the most caudal aspect of her wound is red, swollen, and more tender today. There is a bit of slough accumulation, but no purulent drainage. There is a linear opening area at the most proximal and lateral part of the wound with some slough accumulation; the rest of the wound has healed. 04/04/2022: The swelling and tenderness that were present last visit have resolved. There is just a small residual opening at the most distal portion of her wound with some thin eschar overlying it. Everything else has healed. 04/11/2022: Her wound has healed. Electronic Signature(s) Signed: 04/11/2022 9:10:26 AM By: Fredirick Maudlin MD FACS [place],Signed: 04/11/2022 9:10:26 AM By: Fredirick Maudlin MD FACS [name] ([id number]) [id number] Page 2 of 7 Entered By: Fredirick Maudlin on 04/11/2022  09:10:25 -------------------------------------------------------------------------------- Physical Exam Details Patient Name: Date of Service: [name], [name]. 04/11/2022  8:45 A M Medical Record Number: [id number] Patient Account Number: [id number] Date of Birth/Sex: Treating RN: 07-17-1940 (81 y.o. F) Primary Care Provider: Juline Patch Other Clinician: Referring Provider: Treating Provider/Extender: Manson Allan in Treatment: 5 Constitutional . . . . No acute distress.Marland Kitchen Respiratory Normal work of breathing on room air.. Notes 04/11/2022: Her wound is healed. Electronic Signature(s) Signed: 04/11/2022 6:12:39 AM By: Fredirick Maudlin MD FACS Entered By: Fredirick Maudlin on 04/11/2022 09:12:39 -------------------------------------------------------------------------------- Physician Orders Details Patient Name: Date of Service: [name], [name]. 04/11/2022 8:45 A M Medical Record Number: [id number] Patient Account Number: [id number] Date of Birth/Sex: Treating RN: 1940/08/27 (81 y.o. [name] Primary Care Provider: Juline Patch Other Clinician: Referring Provider: Treating Provider/Extender: Manson Allan in Treatment: 5 Verbal / Phone Orders: No Diagnosis Coding ICD-10 Coding Code Description [phone] Non-pressure chronic ulcer of other part of right lower leg with fat layer exposed I73.00 [name] syndrome without gangrene E83.110 Hereditary hemochromatosis J84.9 Interstitial pulmonary disease, unspecified J43.9 Emphysema, unspecified Z79.899 Other long term (current) drug therapy Follow-up Appointments Nurse Visit: - Congratulations!! it was a joy to care for you!! Bathing/ Shower/ Hygiene May shower and wash wound with soap and water. Edema Control - Lymphedema / SCD / Other Exercise regularly Moisturize legs daily. Electronic Signature(s) [name], [name] ([id number])  [id number] Page 3 of 7 Signed: 04/11/2022 9:20:30 AM By: Fredirick Maudlin MD FACS Entered By: Fredirick Maudlin on 04/11/2022 09:12:50 -------------------------------------------------------------------------------- Problem List Details Patient Name: Date of Service: [name], [name]. 04/11/2022 8:45 A M Medical Record Number: [id number] Patient Account Number: [id number] Date of Birth/Sex: Treating RN: 08/20/1940 (81 y.o. F) Primary Care Provider: Juline Patch Other Clinician: Referring Provider: Treating Provider/Extender: Manson Allan in Treatment: 5 Active Problems ICD-10 Encounter Code Description Active Date MDM Diagnosis [phone] Non-pressure chronic ulcer of other part of right lower leg with fat layer 03/03/2022 No Yes exposed I73.00 [name] syndrome without gangrene 03/03/2022 No Yes E83.110 Hereditary hemochromatosis 03/03/2022 No Yes J84.9 Interstitial pulmonary disease, unspecified 03/03/2022 No Yes J43.9 Emphysema, unspecified 03/03/2022 No Yes Z79.899 Other long term (current) drug therapy 03/03/2022 No Yes Inactive Problems Resolved Problems Electronic Signature(s) Signed: 04/11/2022 9:09:59 AM By: Fredirick Maudlin MD FACS Entered By: Fredirick Maudlin on 04/11/2022 09:09:58 -------------------------------------------------------------------------------- Progress Note Details Patient Name: Date of Service: [name], [name]. 04/11/2022 8:45 A M Medical Record Number: [id number] Patient Account Number: [id number] Date of Birth/Sex: Treating RN: 1941/04/05 (81 y.o. F) Primary Care Provider: Juline Patch Other Clinician: Referring Provider: Treating Provider/Extender: Hedaya, [place], [name] ([id number]) [id number] Page 4 of 7 Weeks in Treatment: 5 Subjective Chief Complaint Information obtained from Patient Patient seen for complaints of Non-Healing  Wound. History of Present Illness (HPI) ADMISSION 03/03/2022 This is an 81 year old woman with a past medical history significant for scleroderma, interstitial lung disease, [name] syndrome, chronic steroid use, hereditary hemochromatosis cytosis and emphysema. She is chronically on corticosteroids as well as mycophenolate. She was visiting her brother in [place] when she fell walking up the steps to the house. She suffered a laceration to her right anterior lower leg. She was seen at a local healthcare facility. The wound was dressed and she was discharged. She has returned to [place] and was referred to the wound care center for further evaluation and management. She and her daughter have been caring for it at home. They have been  applying a nonadherent gauze over the wound and wrapping it with Kerlix and Coban. There is a U-shaped wound on the patient's anterior tibial surface. The wound edges are actually secured with Steri-Strips and there is no exposed tissue. There does appear to have been some serous drainage that has crusted around the edges of the wound but there is no sign or concern for infection. Just proximal to the main wound, she has a smaller abrasion that does get down into the fat layer. There is a little bit of slough and eschar present at this site. 03/09/2022: Much of the wound has healed. The Steri-Strips were removed to reveal just a small open portion at the most distal part of the wound. There is slough accumulation. No concern for infection. 03/16/2022: There is just a small open portion of the wound. It has a little slough accumulation. She was complaining that the areas where the wound is healing and there is eschar, she is noticing a pulling or tight sensation. 03/26/2019: The open portion at the most caudal aspect of her wound is red, swollen, and more tender today. There is a bit of slough accumulation, but no purulent drainage. There is a linear opening area at  the most proximal and lateral part of the wound with some slough accumulation; the rest of the wound has healed. 04/04/2022: The swelling and tenderness that were present last visit have resolved. There is just a small residual opening at the most distal portion of her wound with some thin eschar overlying it. Everything else has healed. 04/11/2022: Her wound has healed. Patient History Information obtained from Patient. Family History Diabetes - Siblings, Heart Disease - Father, Hypertension - Mother, Kidney Disease - Father, Stroke - Mother, No family history of Cancer, Hereditary Spherocytosis, Lung Disease, Seizures, Thyroid Problems, Tuberculosis. Social History Former smoker - quit 25 yrs ago, Marital Status - Widowed, Alcohol Use - Moderate, Drug Use - No History, Caffeine Use - Daily - coffee. Medical History Eyes Patient has history of Cataracts - bil removed Ear/Nose/Mouth/Throat Denies history of Chronic sinus problems/congestion, Middle ear problems Respiratory Patient has history of Chronic Obstructive Pulmonary Disease (COPD) - mild Denies history of Aspiration, Asthma, Pneumothorax, Sleep Apnea, Tuberculosis Cardiovascular Patient has history of Arrhythmia - afib Endocrine Denies history of Type I Diabetes, Type II Diabetes Genitourinary Denies history of End Stage Renal Disease Immunological Patient has history of Raynaudoos, Scleroderma - lungs Denies history of Lupus Erythematosus Integumentary (Skin) Denies history of History of Burn Oncologic Denies history of Received Chemotherapy, Received Radiation Psychiatric Patient has history of Confinement Anxiety Denies history of Anorexia/bulimia Hospitalization/Surgery History - mouth surgery. - left shouilder surgery. - total abdominal hysterectomy. Medical A Surgical History Notes nd Ear/Nose/Mouth/Throat hearing loss, uses hearing aids Hematologic/Lymphatic hemochromatosis Respiratory pulmonary fibrosis,  emphysema Endocrine hypothyroidism [name], [name] ([id number]) [id number] Page 5 of 7 Objective Constitutional No acute distress.. Vitals Time Taken: 8:50 AM, Weight: 142 lbs, Temperature: 97.7 F, Pulse: 62 bpm, Respiratory Rate: 16 breaths/min, Blood Pressure: 121/55 mmHg. Respiratory Normal work of breathing on room air.. General Notes: 04/11/2022: Her wound is healed. Integumentary (Hair, Skin) Wound #1 status is Healed - Epithelialized. Original cause of wound was Trauma. The date acquired was: 02/17/2022. The wound has been in treatment 5 weeks. The wound is located on the Right,Anterior Lower Leg. The wound measures 0cm length x 0cm width x 0cm depth; 0cm^2 area and 0cm^3 volume. There is no tunneling or undermining noted. There is a none present amount of  drainage noted. The wound margin is flat and intact. There is large (67-100%) pink granulation within the wound bed. There is a small (1-33%) amount of necrotic tissue within the wound bed including Eschar. The periwound skin appearance had no abnormalities noted for color. The periwound skin appearance exhibited: Scarring, Dry/Scaly. The periwound skin appearance did not exhibit: Callus, Crepitus, Excoriation, Induration, Rash, Maceration. Assessment Active Problems ICD-10 Non-pressure chronic ulcer of other part of right lower leg with fat layer exposed [name] syndrome without gangrene Hereditary hemochromatosis Interstitial pulmonary disease, unspecified Emphysema, unspecified Other long term (current) drug therapy Plan Follow-up Appointments: Nurse Visit: - Congratulations!! it was a joy to care for you!! Bathing/ Shower/ Hygiene: May shower and wash wound with soap and water. Edema Control - Lymphedema / SCD / Other: Exercise regularly Moisturize legs daily. 04/11/2022: Her wound has healed. We will discharge her from the wound care center. She may follow-up as needed. Electronic  Signature(s) Signed: 04/11/2022 6:13:08 AM By: Fredirick Maudlin MD FACS Entered By: Fredirick Maudlin on 04/11/2022 09:13:07 -------------------------------------------------------------------------------- HxROS Details Patient Name: Date of Service: [name], [name]. 04/11/2022 8:45 A M Medical Record Number: [id number] Patient Account Number: [id number] Date of Birth/Sex: Treating RN: 12-Dec-1940 (81 y.o. F) Primary Care Provider: Juline Patch Other Clinician: Referring Provider: Treating Provider/Extender: Manson Allan in Treatment: [address], [name] ([id number]) [id number] Page 6 of 7 Information Obtained From Patient Eyes Medical History: Positive for: Cataracts - bil removed Ear/Nose/Mouth/Throat Medical History: Negative for: Chronic sinus problems/congestion; Middle ear problems Past Medical History Notes: hearing loss, uses hearing aids Hematologic/Lymphatic Medical History: Past Medical History Notes: hemochromatosis Respiratory Medical History: Positive for: Chronic Obstructive Pulmonary Disease (COPD) - mild Negative for: Aspiration; Asthma; Pneumothorax; Sleep Apnea; Tuberculosis Past Medical History Notes: pulmonary fibrosis, emphysema Cardiovascular Medical History: Positive for: Arrhythmia - afib Endocrine Medical History: Negative for: Type I Diabetes; Type II Diabetes Past Medical History Notes: hypothyroidism Genitourinary Medical History: Negative for: End Stage Renal Disease Immunological Medical History: Positive for: Raynauds; Scleroderma - lungs Negative for: Lupus Erythematosus Integumentary (Skin) Medical History: Negative for: History of Burn Oncologic Medical History: Negative for: Received Chemotherapy; Received Radiation Psychiatric Medical History: Positive for: Confinement Anxiety Negative for: Anorexia/bulimia HBO Extended History  Items Eyes: Cataracts Immunizations Pneumococcal Vaccine: Received Pneumococcal Vaccination: Yes Received Pneumococcal Vaccination On or After 60th Birthday: Yes Implantable Devices PHILLIP, SANDLER ([id number]) [id number] Page 7 of 7 None Hospitalization / Surgery History Type of Hospitalization/Surgery mouth surgery left shouilder surgery total abdominal hysterectomy Family and Social History Cancer: No; Diabetes: Yes - Siblings; Heart Disease: Yes - Father; Hereditary Spherocytosis: No; Hypertension: Yes - Mother; Kidney Disease: Yes - Father; Lung Disease: No; Seizures: No; Stroke: Yes - Mother; Thyroid Problems: No; Tuberculosis: No; Former smoker - quit 25 yrs ago; Marital Status - Widowed; Alcohol Use: Moderate; Drug Use: No History; Caffeine Use: Daily - coffee; Financial Concerns: No; Food, Clothing or Shelter Needs: No; Support System Lacking: No; Transportation Concerns: No Electronic Signature(s) Signed: 04/11/2022 9:20:30 AM By: Fredirick Maudlin MD FACS Entered By: Fredirick Maudlin on 04/11/2022 09:11:39 -------------------------------------------------------------------------------- SuperBill Details Patient Name: Date of Service: [name], [name]. 04/11/2022 Medical Record Number: [id number] Patient Account Number: [id number] Date of Birth/Sex: Treating RN: 03-22-1941 (81 y.o. [name] Primary Care Provider: Juline Patch Other Clinician: Referring Provider: Treating Provider/Extender: Manson Allan in Treatment: 5 Diagnosis Coding ICD-10 Codes Code Description [phone] Non-pressure chronic ulcer of other  part of right lower leg with fat layer exposed I73.00 [name] syndrome without gangrene E83.110 Hereditary hemochromatosis J84.9 Interstitial pulmonary disease, unspecified J43.9 Emphysema, unspecified Z79.899 Other long term (current) drug therapy Facility Procedures : CPT4 Code:  [id number] Description: 99213 - WOUND CARE VISIT-LEV 3 EST PT Modifier: 25 Quantity: 1 Physician Procedures : CPT4 Code Description Modifier [id number] - WC PHYS LEVEL 2 - EST PT ICD-10 Diagnosis Description P32.256 Non-pressure chronic ulcer of other part of right lower leg with fat layer exposed I73.00 [name] syndrome without gangrene Z79.899 Other  long term (current) drug therapy J84.9 Interstitial pulmonary disease, unspecified Quantity: 1 Electronic Signature(s) Signed: 04/11/2022 6:13:24 AM By: Fredirick Maudlin MD FACS Entered By: Fredirick Maudlin on 04/11/2022 09:13:24

## 2022-04-12 ENCOUNTER — Ambulatory Visit (INDEPENDENT_AMBULATORY_CARE_PROVIDER_SITE_OTHER): Payer: Medicare Other | Admitting: Internal Medicine

## 2022-04-12 DIAGNOSIS — J849 Interstitial pulmonary disease, unspecified: Secondary | ICD-10-CM | POA: Diagnosis not present

## 2022-04-12 LAB — PULMONARY FUNCTION TEST
DL/VA % pred: 57 %
DL/VA: 2.34 ml/min/mmHg/L
DLCO cor % pred: 49 %
DLCO cor: 9.26 ml/min/mmHg
DLCO unc % pred: 49 %
DLCO unc: 9.26 ml/min/mmHg
FEF 25-75 Pre: 1.19 L/sec
FEF2575-%Pred-Pre: 85 %
FEV1-%Pred-Pre: 93 %
FEV1-Pre: 1.8 L
FEV1FVC-%Pred-Pre: 98 %
FEV6-%Pred-Pre: 101 %
FEV6-Pre: 2.48 L
FEV6FVC-%Pred-Pre: 105 %
FVC-%Pred-Pre: 96 %
FVC-Pre: 2.48 L
Pre FEV1/FVC ratio: 72 %
Pre FEV6/FVC Ratio: 100 %

## 2022-04-12 NOTE — Progress Notes (Signed)
Spirometry and DLCO Performed Today.  

## 2022-04-12 NOTE — Progress Notes (Signed)
[name] ([id number]) [id number] Page 1 of 8 Visit Report for 04/11/2022 Arrival Information Details Patient Name: Date of Service: [name], [place] [name] 04/11/2022 8:45 A M Medical Record Number: [id number] Patient Account Number: [id number] Date of Birth/Sex: Treating RN: 1941/04/07 (81 y.o. [name] Primary Care Tkai Serfass: Juline Patch Other Clinician: Referring Elizibeth Breau: Treating Shatana Saxton/Extender: Manson Allan in Treatment: 5 Visit Information History Since Last Visit Added or deleted any medications: No Patient Arrived: Ambulatory Any new allergies or adverse reactions: No Arrival Time: 08:50 Had a fall or experienced change in No Accompanied By: self activities of daily living that may affect Transfer Assistance: None risk of falls: Patient Identification Verified: Yes Signs or symptoms of abuse/neglect since last visito No Secondary Verification Process Completed: Yes Hospitalized since last visit: No Patient Has Alerts: Yes Implantable device outside of the clinic excluding No Patient Alerts: Patient on Blood Thinner cellular tissue based products placed in the center ABI RLE 1.07 since last visit: Has Dressing in Place as Prescribed: Yes Has Compression in Place as Prescribed: Yes Pain Present Now: No Electronic Signature(s) Signed: 04/11/2022 9:48:15 PM By: Blanche East RN Entered By: Blanche East on 04/11/2022 08:58:42 -------------------------------------------------------------------------------- Clinic Level of Care Assessment Details Patient Name: Date of Service: Tara NDO N, [name] 04/11/2022 8:45 A M Medical Record Number: [id number] Patient Account Number: [id number] Date of Birth/Sex: Treating RN: June 16, 1941 (81 y.o. [name] Primary Care [name]: Juline Patch Other Clinician: Referring Elim Peale: Treating Cyriah Childrey/Extender: Manson Allan in  Treatment: 5 Clinic Level of Care Assessment Items TOOL 4 Quantity Score X- 1 0 Use when only an EandM is performed on FOLLOW-UP visit ASSESSMENTS - Nursing Assessment / Reassessment X- 1 10 Reassessment of Tara-morbidities (includes updates in patient status) X- 1 5 Reassessment of Adherence to Treatment Plan ASSESSMENTS - Wound and Skin A ssessment / Reassessment X - Simple Wound Assessment / Reassessment - one wound 1 5 '[]'$  - 0 Complex Wound Assessment / Reassessment - multiple wounds '[]'$  - 0 Dermatologic / Skin Assessment (not related to wound area) ASSESSMENTS - Focused Assessment X- 1 5 Circumferential Edema Measurements - multi extremities '[]'$  - 0 Nutritional Assessment / Counseling / Intervention [name], [name] ([id number]) [id number] Page 2 of 8 '[]'$  - 0 Lower Extremity Assessment (monofilament, tuning fork, pulses) '[]'$  - 0 Peripheral Arterial Disease Assessment (using hand held doppler) ASSESSMENTS - Ostomy and/or Continence Assessment and Care '[]'$  - 0 Incontinence Assessment and Management '[]'$  - 0 Ostomy Care Assessment and Management (repouching, etc.) PROCESS - Coordination of Care X - Simple Patient / Family Education for ongoing care 1 15 '[]'$  - 0 Complex (extensive) Patient / Family Education for ongoing care X- 1 10 Staff obtains Programmer, systems, Records, T Results / Process Orders est '[]'$  - 0 Staff telephones HHA, Nursing Homes / Clarify orders / etc '[]'$  - 0 Routine Transfer to another Facility (non-emergent condition) '[]'$  - 0 Routine Hospital Admission (non-emergent condition) '[]'$  - 0 New Admissions / Biomedical engineer / Ordering NPWT Apligraf, etc. , '[]'$  - 0 Emergency Hospital Admission (emergent condition) X- 1 10 Simple Discharge Coordination '[]'$  - 0 Complex (extensive) Discharge Coordination PROCESS - Special Needs '[]'$  - 0 Pediatric / Minor Patient Management '[]'$  - 0 Isolation Patient Management '[]'$  - 0 Hearing / Language /  Visual special needs '[]'$  - 0 Assessment of Community assistance (transportation, D/C planning, etc.) '[]'$  - 0 Additional assistance / Altered mentation '[]'$  -  0 Support Surface(s) Assessment (bed, cushion, seat, etc.) INTERVENTIONS - Wound Cleansing / Measurement X - Simple Wound Cleansing - one wound 1 5 '[]'$  - 0 Complex Wound Cleansing - multiple wounds X- 1 5 Wound Imaging (photographs - any number of wounds) '[]'$  - 0 Wound Tracing (instead of photographs) X- 1 5 Simple Wound Measurement - one wound '[]'$  - 0 Complex Wound Measurement - multiple wounds INTERVENTIONS - Wound Dressings '[]'$  - 0 Small Wound Dressing one or multiple wounds '[]'$  - 0 Medium Wound Dressing one or multiple wounds '[]'$  - 0 Large Wound Dressing one or multiple wounds '[]'$  - 0 Application of Medications - topical '[]'$  - 0 Application of Medications - injection INTERVENTIONS - Miscellaneous '[]'$  - 0 External ear exam '[]'$  - 0 Specimen Collection (cultures, biopsies, blood, body fluids, etc.) '[]'$  - 0 Specimen(s) / Culture(s) sent or taken to Lab for analysis '[]'$  - 0 Patient Transfer (multiple staff / Civil Service fast streamer / Similar devices) '[]'$  - 0 Simple Staple / Suture removal (25 or less) '[]'$  - 0 Complex Staple / Suture removal (26 or more) '[]'$  - 0 Hypo / Hyperglycemic Management (close monitor of Blood Glucose) [name] ([id number]) [id number] Page 3 of 8 '[]'$  - 0 Ankle / Brachial Index (ABI) - do not check if billed separately X- 1 5 Vital Signs Has the patient been seen at the hospital within the last three years: Yes Total Score: 80 Level Of Care: New/Established - Level 3 Electronic Signature(s) Signed: 04/11/2022 9:48:15 PM By: Blanche East RN Entered By: Blanche East on 04/11/2022 09:05:21 -------------------------------------------------------------------------------- Encounter Discharge Information Details Patient Name: Date of Service: Tara NDO N, [name] 04/11/2022 8:45 A  M Medical Record Number: [id number] Patient Account Number: [id number] Date of Birth/Sex: Treating RN: 06/28/1940 (81 y.o. [name] Primary Care Eulla Kochanowski: Juline Patch Other Clinician: Referring Latresha Yahr: Treating Anum Palecek/Extender: Manson Allan in Treatment: 5 Encounter Discharge Information Items Discharge Condition: Stable Ambulatory Status: Ambulatory Discharge Destination: Home Transportation: Private Auto Accompanied By: self Schedule Follow-up Appointment: No Clinical Summary of Care: Electronic Signature(s) Signed: 04/11/2022 9:48:15 PM By: Blanche East RN Entered By: Blanche East on 04/11/2022 09:08:42 -------------------------------------------------------------------------------- Lower Extremity Assessment Details Patient Name: Date of Service: Tara NDO N, [name] 04/11/2022 8:45 A M Medical Record Number: [id number] Patient Account Number: [id number] Date of Birth/Sex: Treating RN: 10-13-1940 (81 y.o. [name] Primary Care Sufian Ravi: Juline Patch Other Clinician: Referring Maybell Misenheimer: Treating Huntington Leverich/Extender: Manson Allan in Treatment: 5 Edema Assessment Assessed: [Left: No] [Right: No] Edema: [Left: Ye] [Right: s] Calf Left: Right: Point of Measurement: From Medial Instep 32 cm Ankle Left: Right: Point of Measurement: From Medial Instep 19 cm Vascular Assessment Left: [[id number] Page 4 of 8Right:] Pulses: Dorsalis Pedis Palpable: [[id number] Page 4 of 8Yes] Electronic Signature(s) Signed: 04/11/2022 9:48:15 PM By: Blanche East RN Entered By: Blanche East on 04/11/2022 08:53:56 -------------------------------------------------------------------------------- Multi Wound Chart Details Patient Name: Date of Service: Tara NDO N, [name] 04/11/2022 8:45 A M Medical Record Number: [id number] Patient Account Number:  [id number] Date of Birth/Sex: Treating RN: 1940-12-12 (81 y.o. F) Primary Care Mirranda Monrroy: Juline Patch Other Clinician: Referring Tyne Banta: Treating Kimie Pidcock/Extender: Manson Allan in Treatment: 5 Vital Signs Height(in): Pulse(bpm): 24 Weight(lbs): 142 Blood Pressure(mmHg): 121/55 Body Mass Index(BMI): Temperature(F): 97.7 Respiratory Rate(breaths/min): 16 [1:Photos:] [[name]/A:[name]/A] Right, Anterior Lower Leg [name]/A [name]/A Wound Location: Trauma [name]/A [name]/A Wounding Event: Skin T ear [name]/A [name]/A  Primary Etiology: Cataracts, Chronic Obstructive [name]/A [name]/A Comorbid History: Pulmonary Disease (COPD), Arrhythmia, Raynauds, Scleroderma, Confinement Anxiety 02/17/2022 [name]/A [name]/A Date Acquired: 5 [name]/A [name]/A Weeks of Treatment: Healed - Epithelialized [name]/A [name]/A Wound Status: No [name]/A [name]/A Wound Recurrence: 0x0x0 [name]/A [name]/A Measurements L x W x D (cm) 0 [name]/A [name]/A A (cm) : rea 0 [name]/A [name]/A Volume (cm) : 100.00% [name]/A [name]/A % Reduction in Area: 100.00% [name]/A [name]/A % Reduction in Volume: Full Thickness Without Exposed [name]/A [name]/A Classification: Support Structures None Present [name]/A [name]/A Exudate A mount: Flat and Intact [name]/A [name]/A Wound Margin: Large (67-100%) [name]/A [name]/A Granulation Amount: Pink [name]/A [name]/A Granulation Quality: Small (1-33%) [name]/A [name]/A Necrotic Amount: Eschar [name]/A [name]/A Necrotic Tissue: Fascia: No [name]/A [name]/A Exposed Structures: Fat Layer (Subcutaneous Tissue): No Tendon: No Muscle: No Joint: No Bone: No Small (1-33%) [name]/A [name]/A Epithelialization: Scarring: Yes [name]/A [name]/A Periwound Skin Texture: Excoriation: No Induration: No IVONNE, [place] ([id number]) [id number] Page 5 of 8 Callus: No Crepitus: No Rash: No Dry/Scaly: Yes [name]/A [name]/A Periwound Skin Moisture: Maceration: No No Abnormalities Noted [name]/A [name]/A Periwound Skin Color: Treatment Notes Wound #1 (Lower Leg) Wound Laterality: Right, Anterior Cleanser Peri-Wound Care Topical Primary  Dressing Secondary Dressing Secured With Compression Wrap Compression Stockings Add-Ons Electronic Signature(s) Signed: 04/11/2022 6:10:04 AM By: Fredirick Maudlin MD FACS Entered By: Fredirick Maudlin on 04/11/2022 09:10:04 -------------------------------------------------------------------------------- Multi-Disciplinary Care Plan Details Patient Name: Date of Service: Tara NDO N, [name] 04/11/2022 8:45 A M Medical Record Number: [id number] Patient Account Number: [id number] Date of Birth/Sex: Treating RN: 1941/01/04 (81 y.o. [name] Primary Care Peytyn Trine: Juline Patch Other Clinician: Referring Lucifer Soja: Treating Cleone Hulick/Extender: Manson Allan in Treatment: 5 Active Inactive Electronic Signature(s) Signed: 04/11/2022 9:48:15 PM By: Blanche East RN Entered By: Blanche East on 04/11/2022 09:04:29 -------------------------------------------------------------------------------- Pain Assessment Details Patient Name: Date of Service: Tara NDO N, [name] 04/11/2022 8:45 A M Medical Record Number: [id number] Patient Account Number: [id number] Date of Birth/Sex: Treating RN: [birth year] (82 y.o. [name] Primary Care Hania Cerone: Juline Patch Other Clinician: Referring Tvisha Schwoerer: Treating Latanza Pfefferkorn/Extender: Manson Allan in Treatment: 5 Active Problems SHIRLA, HODGKISS ([id number]) [id number] Page 6 of 8 Location of Pain Severity and Description of Pain Patient Has Paino No Site Locations Pain Management and Medication Current Pain Management: Electronic Signature(s) Signed: 04/11/2022 9:48:15 PM By: Blanche East RN Entered By: Blanche East on 04/11/2022 08:53:47 -------------------------------------------------------------------------------- Patient/Caregiver Education Details Patient Name: Date of Service: Tara NDO N, [name] 10/16/2023andnbsp8:45 A M Medical Record  Number: [id number] Patient Account Number: [id number] Date of Birth/Gender: Treating RN: [birth year] (81 y.o. [name] Primary Care Physician: Juline Patch Other Clinician: Referring Physician: Treating Physician/Extender: Manson Allan in Treatment: 5 Education Assessment Education Provided To: Patient Education Topics Provided Electronic Signature(s) Signed: 04/11/2022 9:48:15 PM By: Blanche East RN Entered By: Blanche East on 04/11/2022 09:04:45 -------------------------------------------------------------------------------- Wound Assessment Details Patient Name: Date of Service: Tara NDO N, [name] 04/11/2022 8:45 A M Medical Record Number: [id number] Patient Account Number: [id number] Date of Birth/Sex: Treating RN: [birth year] (81 y.o. [name] Primary Care Jaivion Kingsley: Juline Patch Other Clinician: Referring Keevin Panebianco: Treating Colin Norment/Extender: Angella, Montas, Elmon [name] ([id number]) [id number] Page 7 of 8 Weeks in Treatment: 5 Wound Status Wound Number: 1 Primary Skin T ear Etiology: Wound Location: Right, Anterior Lower Leg Wound Healed - Epithelialized Wounding Event: Trauma Status: Date Acquired: 02/17/2022  Comorbid Cataracts, Chronic Obstructive Pulmonary Disease (COPD), Weeks Of Treatment: 5 History: Arrhythmia, Raynauds, Scleroderma, Confinement Anxiety Clustered Wound: No Photos Wound Measurements Length: (cm) Width: (cm) Depth: (cm) Area: (cm) Volume: (cm) 0 % Reduction in Area: 100% 0 % Reduction in Volume: 100% 0 Epithelialization: Small (1-33%) 0 Tunneling: No 0 Undermining: No Wound Description Classification: Full Thickness Without Exposed Suppor Wound Margin: Flat and Intact Exudate Amount: None Present t Structures Foul Odor After Cleansing: No Slough/Fibrino No Wound Bed Granulation Amount: Large (67-100%) Exposed Structure Granulation  Quality: Pink Fascia Exposed: No Necrotic Amount: Small (1-33%) Fat Layer (Subcutaneous Tissue) Exposed: No Necrotic Quality: Eschar Tendon Exposed: No Muscle Exposed: No Joint Exposed: No Bone Exposed: No Periwound Skin Texture Texture Color No Abnormalities Noted: No No Abnormalities Noted: Yes Callus: No Crepitus: No Excoriation: No Induration: No Rash: No Scarring: Yes Moisture No Abnormalities Noted: No Dry / Scaly: Yes Maceration: No Treatment Notes Wound #1 (Lower Leg) Wound Laterality: Right, Anterior Cleanser Peri-Wound Care Topical Primary Dressing Secondary Dressing Secured With [name] ([id number]) [id number] Page 8 of 8 Compression Wrap Compression Stockings Add-Ons Electronic Signature(s) Signed: 04/11/2022 9:48:15 PM By: Blanche East RN Entered By: Blanche East on 04/11/2022 09:04:09 -------------------------------------------------------------------------------- Vitals Details Patient Name: Date of Service: Tara NDO N, [name] 04/11/2022 8:45 A M Medical Record Number: [id number] Patient Account Number: [id number] Date of Birth/Sex: Treating RN: 10/30/1940 (81 y.o. [name] Primary Care Lafreda Casebeer: Juline Patch Other Clinician: Referring Ellena Kamen: Treating Deepa Barthel/Extender: Manson Allan in Treatment: 5 Vital Signs Time Taken: 08:50 Temperature (F): 97.7 Weight (lbs): 142 Pulse (bpm): 62 Respiratory Rate (breaths/min): 16 Blood Pressure (mmHg): 121/55 Reference Range: 80 - 120 mg / dl Electronic Signature(s) Signed: 04/11/2022 9:48:15 PM By: Blanche East RN Entered By: Blanche East on 04/11/2022 08:51:10

## 2022-04-12 NOTE — Patient Instructions (Signed)
Spirometry and DLCO Performed Today.  

## 2022-04-18 ENCOUNTER — Ambulatory Visit: Payer: Medicare Other | Attending: Physician Assistant | Admitting: Physician Assistant

## 2022-04-18 ENCOUNTER — Encounter: Payer: Self-pay | Admitting: Physician Assistant

## 2022-04-18 VITALS — BP 108/65 | HR 56 | Resp 16 | Ht 63.5 in | Wt 141.6 lb

## 2022-04-18 DIAGNOSIS — E559 Vitamin D deficiency, unspecified: Secondary | ICD-10-CM

## 2022-04-18 DIAGNOSIS — M7061 Trochanteric bursitis, right hip: Secondary | ICD-10-CM

## 2022-04-18 DIAGNOSIS — M5136 Other intervertebral disc degeneration, lumbar region: Secondary | ICD-10-CM

## 2022-04-18 DIAGNOSIS — R760 Raised antibody titer: Secondary | ICD-10-CM

## 2022-04-18 DIAGNOSIS — Z8679 Personal history of other diseases of the circulatory system: Secondary | ICD-10-CM

## 2022-04-18 DIAGNOSIS — M810 Age-related osteoporosis without current pathological fracture: Secondary | ICD-10-CM

## 2022-04-18 DIAGNOSIS — Z79899 Other long term (current) drug therapy: Secondary | ICD-10-CM | POA: Diagnosis not present

## 2022-04-18 DIAGNOSIS — J439 Emphysema, unspecified: Secondary | ICD-10-CM

## 2022-04-18 DIAGNOSIS — J849 Interstitial pulmonary disease, unspecified: Secondary | ICD-10-CM

## 2022-04-18 DIAGNOSIS — Z87891 Personal history of nicotine dependence: Secondary | ICD-10-CM

## 2022-04-18 DIAGNOSIS — M349 Systemic sclerosis, unspecified: Secondary | ICD-10-CM

## 2022-04-18 DIAGNOSIS — R918 Other nonspecific abnormal finding of lung field: Secondary | ICD-10-CM

## 2022-04-18 DIAGNOSIS — M7062 Trochanteric bursitis, left hip: Secondary | ICD-10-CM

## 2022-04-18 DIAGNOSIS — Z7952 Long term (current) use of systemic steroids: Secondary | ICD-10-CM

## 2022-04-18 DIAGNOSIS — Z8639 Personal history of other endocrine, nutritional and metabolic disease: Secondary | ICD-10-CM

## 2022-04-18 DIAGNOSIS — M19042 Primary osteoarthritis, left hand: Secondary | ICD-10-CM

## 2022-04-18 DIAGNOSIS — M19041 Primary osteoarthritis, right hand: Secondary | ICD-10-CM

## 2022-04-18 MED ORDER — METHOCARBAMOL 500 MG PO TABS: 500.0000 mg | ORAL_TABLET | Freq: Every day | ORAL | 2 refills | Status: DC | PRN

## 2022-04-18 NOTE — Progress Notes (Signed)
RBC count, hgb, hct are low.  Please forward results to PCP as requested.  Rest of CBC WNL.

## 2022-04-19 ENCOUNTER — Other Ambulatory Visit: Payer: Self-pay | Admitting: *Deleted

## 2022-04-19 DIAGNOSIS — R790 Abnormal level of blood mineral: Secondary | ICD-10-CM

## 2022-04-19 NOTE — Progress Notes (Signed)
Ferritin is low-6.  CMP WNL.  UA normal. TSH WNL.   Please forward results to her hematologist as requested.

## 2022-04-20 ENCOUNTER — Telehealth: Payer: Self-pay | Admitting: Hematology and Oncology

## 2022-04-20 NOTE — Telephone Encounter (Signed)
Scheduled appt per 10/24 referral. Pt is aware of appt date and time. Pt is aware to arrive 15 mins prior to appt time and to bring and updated insurance card. Pt is aware of appt location.   

## 2022-04-21 ENCOUNTER — Ambulatory Visit (INDEPENDENT_AMBULATORY_CARE_PROVIDER_SITE_OTHER): Payer: Medicare Other | Admitting: Internal Medicine

## 2022-04-21 ENCOUNTER — Encounter: Payer: Self-pay | Admitting: Internal Medicine

## 2022-04-21 VITALS — BP 106/58 | HR 66 | Temp 98.4°F | Ht 63.5 in | Wt 141.0 lb

## 2022-04-21 DIAGNOSIS — M349 Systemic sclerosis, unspecified: Secondary | ICD-10-CM

## 2022-04-21 DIAGNOSIS — Z79899 Other long term (current) drug therapy: Secondary | ICD-10-CM | POA: Diagnosis not present

## 2022-04-21 DIAGNOSIS — R768 Other specified abnormal immunological findings in serum: Secondary | ICD-10-CM | POA: Diagnosis not present

## 2022-04-21 DIAGNOSIS — R053 Chronic cough: Secondary | ICD-10-CM

## 2022-04-21 DIAGNOSIS — J849 Interstitial pulmonary disease, unspecified: Secondary | ICD-10-CM

## 2022-04-21 DIAGNOSIS — I7300 Raynaud's syndrome without gangrene: Secondary | ICD-10-CM | POA: Diagnosis not present

## 2022-04-21 DIAGNOSIS — J432 Centrilobular emphysema: Secondary | ICD-10-CM

## 2022-04-21 DIAGNOSIS — Z23 Encounter for immunization: Secondary | ICD-10-CM | POA: Diagnosis not present

## 2022-04-21 DIAGNOSIS — Z7185 Encounter for immunization safety counseling: Secondary | ICD-10-CM

## 2022-04-21 LAB — COMPLETE METABOLIC PANEL WITH GFR
AG Ratio: 2 (calc) (ref 1.0–2.5)
ALT: 9 U/L (ref 6–29)
AST: 13 U/L (ref 10–35)
Albumin: 4.1 g/dL (ref 3.6–5.1)
Alkaline phosphatase (APISO): 67 U/L (ref 37–153)
BUN: 15 mg/dL (ref 7–25)
CO2: 27 mmol/L (ref 20–32)
Calcium: 9.1 mg/dL (ref 8.6–10.4)
Chloride: 103 mmol/L (ref 98–110)
Creat: 0.63 mg/dL (ref 0.60–0.95)
Globulin: 2.1 g/dL (calc) (ref 1.9–3.7)
Glucose, Bld: 89 mg/dL (ref 65–99)
Potassium: 4.3 mmol/L (ref 3.5–5.3)
Sodium: 140 mmol/L (ref 135–146)
Total Bilirubin: 0.3 mg/dL (ref 0.2–1.2)
Total Protein: 6.2 g/dL (ref 6.1–8.1)
eGFR: 90 mL/min/{1.73_m2} (ref >=60)

## 2022-04-21 LAB — CBC WITH DIFFERENTIAL/PLATELET
Absolute Monocytes: 518 cells/uL (ref 200–950)
Basophils Absolute: 57 cells/uL (ref 0–200)
Basophils Relative: 0.7 %
Eosinophils Absolute: 97 cells/uL (ref 15–500)
Eosinophils Relative: 1.2 %
HCT: 31.8 % — ABNORMAL LOW (ref 35.0–45.0)
Hemoglobin: 10.2 g/dL — ABNORMAL LOW (ref 11.7–15.5)
Lymphs Abs: 883 cells/uL (ref 850–3900)
MCH: 30.1 pg (ref 27.0–33.0)
MCHC: 32.1 g/dL (ref 32.0–36.0)
MCV: 93.8 fL (ref 80.0–100.0)
MPV: 11.7 fL (ref 7.5–12.5)
Monocytes Relative: 6.4 %
Neutro Abs: 6545 cells/uL (ref 1500–7800)
Neutrophils Relative %: 80.8 %
Platelets: 337 10*3/uL (ref 140–400)
RBC: 3.39 10*6/uL — ABNORMAL LOW (ref 3.80–5.10)
RDW: 13.1 % (ref 11.0–15.0)
Total Lymphocyte: 10.9 %
WBC: 8.1 10*3/uL (ref 3.8–10.8)

## 2022-04-21 LAB — BETA-2 GLYCOPROTEIN ANTIBODIES
Beta-2 Glyco 1 IgA: 28.3 U/mL — ABNORMAL HIGH (ref <20.0)
Beta-2 Glyco 1 IgM: 10 U/mL (ref <20.0)
Beta-2 Glyco I IgG: <2 U/mL (ref <20.0)

## 2022-04-21 LAB — URINALYSIS, ROUTINE W REFLEX MICROSCOPIC
Bacteria, UA: NONE SEEN /HPF
Bilirubin Urine: NEGATIVE
Glucose, UA: NEGATIVE
Hgb urine dipstick: NEGATIVE
Hyaline Cast: NONE SEEN /LPF
Ketones, ur: NEGATIVE
Leukocytes,Ua: NEGATIVE
Nitrite: NEGATIVE
Protein, ur: NEGATIVE
RBC / HPF: NONE SEEN /HPF (ref 0–2)
Specific Gravity, Urine: 1.025 (ref 1.001–1.035)
WBC, UA: NONE SEEN /HPF (ref 0–5)
pH: 7 (ref 5.0–8.0)

## 2022-04-21 LAB — CARDIOLIPIN ANTIBODIES, IGG, IGM, IGA
Anticardiolipin IgA: 36.8 APL-U/mL — ABNORMAL HIGH (ref <20.0)
Anticardiolipin IgG: <2 GPL-U/mL (ref <20.0)
Anticardiolipin IgM: 7.6 MPL-U/mL (ref <20.0)

## 2022-04-21 LAB — TSH: TSH: 0.63 mIU/L (ref 0.40–4.50)

## 2022-04-21 LAB — FERRITIN: Ferritin: 6 ng/mL — ABNORMAL LOW (ref 16–288)

## 2022-04-21 NOTE — Progress Notes (Signed)
PCP [name],[name], PA-C   HPI  IOV 02/10/2016  Chief Complaint  Patient presents with   Pulmonary Consult    Pt referred by Dr. Melford Aase for chronic cough x 1 year. Pt states she feels she has a tickle that is causing her dry cough. Pt states she has DOE when climbing stairs. Pt deneis CP/tightness.     81 year old female with hemochromatosis homozygous gene followed by Dr. Beryle Beams (last phlebotomy many years ago and is on serial monitoring) with children and siblings with active disease. She has also atrial fibrillation followed by Dr. Caryl Comes. Reports insidious onset of chronic cough in the last 1 year. It is stable since onset. It fluctuates between mild and severe in severity. It is mostly dry in quality. Mostly present in the daytime but sometimes also wakes her up at night. It is definitely not progressive. It is episodic and present every day. Aggravated by talking sometimes and then the throat feels dry associated with tickle in the throat and also sensation the cough is coming from the upper chest retrosternally and sometimes relieved by drinking water or chewing on a lozenge. Also aggravated by seasonal changes particularly in the spring and the fall which makes her think she has allergies. There is sometimes associated gag. She also reports nonspecific occasional wheezing and associated shortness of breath that is nonspecific but present with exertion and relieved by rest No other clear cut aggravating or relieving factors.   cough associated history  - Medications: She is not on fish oil or ACE inhibitors - Sinus drainage: She does admit to spring allergies. She did have something removed from her hard palate several years ago by ENT does not know details - Acid reflux: She says she is significant acid reflux and is on Prilosec. Without which she'll have significant symptoms - Pulmonary disease: FenO 12 02/10/2016  and normal. She denies any personal history of asthma or  pulmonary fibrosis of COPD or emphysema smoked one pack per day started smoking at age 18 and quit in 1999. Making a 42 pack smoking history. Chest x-ray 08/26/2014 personally visualized is clear - Tobacco :  reports that she quit smoking about 18 years ago. She has a 42.00 pack-year smoking history. She has never used smokeless tobacco.       OV 02/18/2016  Chief Complaint  Patient presents with   Follow-up    Pt here after PFT and HRCT. Pt denies changes in SOB and cough. Pt denies any new complaints at this time.    Follow-up chronic cough. This visit is to follow-up on test results of function test and high resolution CT chest which are described below9 results show isolated reduction in diffusion capacity which can be explained by possible early ILD and also emphysema. There are also new findings of nodule 6 mm bilaterally on lower lobes. She prefers a very cautious approach to this evaluation.     Pulmonary function test 02/12/2016 - FVC 2.8 cm/101%, FEV1 1.9 L/91%. Ratio 60/90%. Total lung capacity 115%. DLCO reduced at 14.5/60%. She is isolated reduction in diffusion capacity    HRCT chest 02/16/16 IMPRESSION: 1. Mild patchy subpleural reticulation in both lungs with a basilar predominance. No significant traction bronchiectasis. No frank honeycombing. Findings could represent an interstitial lung disease such as nonspecific interstitial pneumonia (NSIP), with early usual interstitial pneumonia (UIP) not excluded. A follow-up high-resolution chest CT study in 12 months is recommended to assess temporal pattern stability. 2. Bilateral lower lobe  solid pulmonary nodules, largest 6 mm. Non-contrast chest CT at 3-6 months is recommended. If the nodules are stable at time of repeat CT, then future CT at 18-24 months (from today's scan) is considered optional for low-risk patients, but is recommended for high-risk patients. This recommendation follows the consensus statement:  Guidelines for Management of Incidental Pulmonary Nodules Detected on CT Images:From the Fleischner Society 2017; published online before print (10.1148/radiol.[id number]). 3. Additional findings include mild-to-moderate centrilobular emphysema, aortic atherosclerosis and 2 vessel coronary atherosclerosis.     Electronically Signed   By: Ilona Sorrel M.D.   On: 02/16/2016 14:14    OV 08/31/2016  Chief Complaint  Patient presents with   Follow-up    HRCT was never scheduled. Pt states that the cough is still present >> slightly improved since last OV. Pt states that she feels her allergies has gotten it ramped up again.    Follow-up multifactorial cough associated with autoimmune antibody positive, interstitial lung disease and emphysema previous history of smoking  After last visit in August 2017 she had high resolution CT chest in December 2017 that showed persistence of ILD. I personally visualized the CT chest. He comes for follow-up. She tells me that since August 2017 she has insidious onset of shortness of breath and is progressively worse. It is mild and present only on inclines is not therefore tenderness. In terms of her cough is persistent. It is associated with significant sinus drainage that she thinks is allergy related. She constantly clears her throat. Lab review shows autoimmune antibody positive in August 2017 with scleroderma antibody slightly elevated at 6.4. I referred her to rheumatology but she does not remember this and she did not make this follow-up. She also tells me that she definitely has a long history of [name] phenomena in her fingers but no one has ever formally diagnosed with connective tissu  e disease.  OV 11/28/2016  Chief Complaint  Patient presents with   Follow-up    Pt here after PFT. Pt states her breathing is uchanged since last OV. Pt c/o dry cough.- pt states this has slightly improved since last OV. Pt denies CP/tightness and f/c/s.      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 Since then she has seen rheumatologis Dr. Keturah Barre. I reviewd the notes and also discussed with the patient wa a good understading of what s going on. She tells me that some of the lupus antibodies have been positive correlating with Raynaud. Patient is on beta blocker for atrial fbrillation and it is the preference by the rheumatologist Dr. D that she switc to calcium channel blocker. She has a follow-up appointment pending with her electrophysiologist Dr. Caryl Comes.  She is scleroderma antibody positive but according to her rheumtolgist she does not have dermatologic  Manifestation of the disese. 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 She pulm  function test today and this is stable. In terms of arrest or symptoms she stable. The cough is actually improved. But she does get dyspneic walking up stairs especially a few flights. She does not wnt her to pulmonary habilitation because se exrcises on the treadmill although she does not monitor her saturations.   reports that she quit smoking about 19 years ago. She has a 42.00 pack-year smoking history. she has never used smokeless tobacco.   OV 06/12/2017  Chief Complaint  Patient presents with   Follow-up    Pt states that she has been doing good since last visit. States that she has a "tickle cough" that is sporadic and has SOB that she states is when she climbs 2 flights of stairs. Denies any CP.    Follow-up combined emphysema was interstitial lung disease [autoimmune undifferentiated connective tissue disease interstitial lung disease]. Autoimmune features from May 2018 rheumatology noted with Dr. Keturah Barre: Raynaud phenomenon positive without gangrene, positive anti-cardiolipin and positive IgM and SCL-70 positive without clinical features of  scleroderma    Last seen June 2018. Since then she's stable. Overall she tells me that only problem is a tickle in her throat and slight cough. This is up and down depending on the pollen exposure she gets. She gets dyspneic for climbing few to several flights of stairs. This is unchanged. She did have full function test today and that shows mild significant worsening though overall gradient is only mild.. However she's not feeling this. She has appointment with Dr. Keturah Barre  pending    OV 07/18/2017  Chief Complaint  Patient presents with   Follow-up    HRCT done 07/03/17.  Pt states she is the same as she was at last visit. SOB with exertion.    Follow-up combined emphysema was interstitial lung disease [autoimmune undifferentiated connective tissue disease interstitial lung disease]. Autoimmune features from May 2018 rheumatology noted with Dr. Keturah Barre: Raynaud phenomenon positive without gangrene, positive anti-cardiolipin and positive IgM and SCL-70 positive without clinical features of scleroderma    SWAY GUTTIERREZ presents for follow-up.  She is here to discuss the results of a high-resolution CT scan of the chest.  The scan was reviewed by Dr. Rosario Jacks thoracic radiology who feels that patient has probable UIP pattern that is definitely progressive compared to August 2017 although there is no comment about progression since June 2018 CT scan.  The pulmonary nodules itself are stable since August 2017 and are likely benign.  The change in the CT scan which is mild progression pulmonary fibrosis corresponds with the pulmonary function test that shows mild progression.  She is now here with her daughter Junie Panning and her husband.  She also states that 1 of her other daughters has rheumatoid arthritis and is on TNF alpha blockade.  All her 3 daughters have hemo-chromatosis    OV 08/29/2017  Chief Complaint  Patient presents with   Follow-up    Pt states she has been doing good since last visit. Pt  recently went to [place] and had some mild problems with SOB due to heat.  Pt was prescribed cellcept and bactrim but has not yet started meds due to questions with other meds she is taking.    81 year old female with ILD secondary to autoimmune disease clinically suspicious of scleroderma but also previous history of hemochromatosis with family history of hemochromatosis.  She is here with her husband.  This visit is only a discussion visit.  Because she has many questions about starting CellCept before she actually does.  We did hepatitis virus panel, QuantiFERON gold, G6PD and all this is normal.  Lab work is normal.  I checked with [facility] pharmacist about interactions and was cleared for her to start CellCept.  The only recommendation was for patient to come off Prilosec because of reduced effect of CellCept.  Patient questions revolved around CellCept: She does not want to come off PPI because of hiatal hernia and has had bad acid  reflux.  So I would not Willis Modena asking for any alternative PPI.  In addition she states she takes multiple multivitamins including Biotene, vitamin D3, vitamin D, vitamin T97 and folic acid.  She also takes Allegra occasionally.  She wants to make sure it is all okay with CellCept.  She has not had a flu shot today and she is asking if she should have it.  She has never had flu shot before.  She is also wondering about anticoagulation in the setting of CellCept and thyroid issues in the setting of Bactrim.  In terms of her genetics: Initially I corresponded with our local geneticist who thought she might be better served at [facility] but the [facility] genetics person wrote to me saying that patient should first be seen by [facility] rheumatology and hematology.  Patient's not so sure she wants to see the subspecialist.  She will speak to Dr. Beryle Beams her local hematologist to see if it is worthwhile seeing the hematology department at [facility].   Lungs/Pleura: Moderate centrilobular emphysema. Mild basilar predominant subpleural reticulation and ground-glass, increased from 02/16/2016. No traction bronchiectasis/ bronchiolectasis, architectural distortion or honeycombing. Side-by-side nodules in the medial left lower lobe measure up to 6 mm (series 3, image 95), unchanged from 02/16/2016 and considered benign. No air trapping. No pleural fluid. Airway is unremarkable.  IMPRESSION: 1. Mild progression in basilar predominant subpleural fibrosis from 02/16/2016, raising suspicion for usual interstitial pneumonitis. 2. Aortic atherosclerosis (ICD10-170.0). Moderate coronary artery calcification. 3.  Emphysema (ICD10-J43.9).     Electronically Signed   By: Lorin Picket M.D.   On: 07/03/2017 09:54    OV 09/19/2017  Chief Complaint  Patient presents with   Follow-up    Pt states she has been doing good. Currently on cellcept and bactrim and has been doing well on it once put back on omeprazole. Pt still has the cough and sometimes when trying to take a deep breath can't fully get all O2 out.   Emonee Winkowski returns for follow-up.  This is for autoimmune interstitial lung disease.  She was started on CellCept recently.  She is here to follow-up for therapeutic drug monitoring.  Because she was on omeprazole and we were initiating CellCept with Bactrim we asked her to hold off on omeprazole because the omeprazole impairs drug levels of CellCept.  However she tried it for a week and her acid reflux was significant despite ranitidine so she went back on omeprazole.  Pharmacist Willis Modena is advising changing the CellCept to equivaekbtdose of Myfortic. So far tolerating cellcept/pred/bactrimm well. Has seen dR G in heme and on observation. Had PFT today on cellcept/pred/bactrim and is better! Continue siwht spriva  OV 10/31/2017  . Chief Complaint  Patient presents with   Follow-up    Pt currently taking  myfortic and states she has been well on that. Pt states she has some mild nausea and has some problems with sleeping at night. SOB is stable,mild coughing. Denies any CP.    Follow-up progressive interstitial lung disease with SCL-70 antibody positive and Raynard Follow-up associated emphysema.  Last visit September 19, 2017.  At that time she had intolerance to the CellCept therefore we switched her to Myfortic.  She is here to report for follow-up about this.  She is tolerating Myfortic just well.  She had some transient insomnia for a few days last week but this resolved.  She only has mild intermittent occasional nausea with the Myfortic but otherwise is  tolerating it really well at the low-dose of 360 mg twice daily.  She continues on prednisone 10 mg daily and Bactrim 3 times a week.  She is due for blood work today.  There are no other new issues.  She continues on Spiriva for her emphysema   OV 02/01/2018  Chief Complaint  Patient presents with   Follow-up    PFT performed today.  Pt stated other than having shingles earlier, things have been doing good for her. Pt states SOB is stable and states she still has an occ cough.    Follow-up progressive interstitial lung disease with SCL-70 antibody positive and Raynud - IPAF Follow-up associated emphysema   KENZLIE DISCH presents for routine follow-up with interstitial lung disease clinic. She has the above problems. In the interim she tells me she had developed shingles on the right neck but this is resolved. This is despite having zoster vaccine in 2012. She was yet to have the bnnew  shingles vaccine.in any event currently she is feeling well. She only has occasional post residual neuropathic pain. In terms of her shortness of breath it is stable and minimal. Walking desaturation test shows stability. Pulmicort function test below show stability. She is tolerating the mycophenolate myfortic well at 360 mg twice daily and prednisone 10 mg  daily and Bactrim for PCP prophylaxis. Overall she feels stable and feels that it is best medicines on touch in terms of dosing change  OV 08/14/2018  Subjective:  Patient ID: [name], female , DOB: 02/16/1941 , age 80 y.o. , MRN: [id number] , ADDRESS: [place] [id number]   08/14/2018 -   Chief Complaint  Patient presents with   Follow-up    PFT performed today.  Pt states she is slowly getting better after her recent pna. States she still has some issues with SOB and an occ cough. Pt also states that she feels like she has a weight on her chest at times.    Follow-up progressive interstitial lung disease with SCL-70 antibody positive and Raynud - IPAF - on myfortic since march 2019 with pred 10 and bactrim  Follow-up associated emphysema - on spiriva    HPIfortic KAYLIN MARCON 81 y.o. -returns for 71-monthfollow-up of her interstitial lung disease associated with emphysema.  She presents with her middle daughter EJunie Panningwho is met me before.  She continues on Myfortic and prednisone 10 mg and Bactrim prophylaxis.  She continues on Spiriva.  She had an episode of atrial fibrillation according to review of the chart and my on history with in the middle of January 2020.  At this time she had a chest x-ray that showed asymptomatic upper lobe pneumonia.  I personally visualized the chest x-ray and confirmed the findings.  Antibiotic was not prescribed.  Then I follow this up with a CT scan of the chest [we canceled her elective colonoscopy].  CT chest showed improved pneumonia findings.  I prescribed even though she was feeling well cephalexin for a week.  She says she took it.  She continues to feel well except she has baseline amount of symptoms that is documented below.  Of note the high-resolution CT chest showed an improvement in ILD compared to a year ago.  In fact her pulmonary function tests are also showing stability/improvement as documented below.  She is very  happy about this outcome with immunosuppression .  However, she is a little bit unsure why she is symptomatic in  terms of shortness of breath and cough.  She is very concerned that her fibrosis is getting worse.  She wants to have closer follow-up than every 6 months.   IMPRESSION: HRCT Jan 2020 1. Vaguely bandlike patchy consolidation and ground-glass opacity at the periphery of the right upper and superior segment right lower lobes, new since 07/03/2017 chest CT, improving compared to 07/10/2018 chest radiograph, favoring resolving pneumonia with evolving postinfectious scarring. 2. Previously described basilar predominant subpleural reticulation and ground-glass attenuation is minimal and appears decreased in the interval. No bronchiectasis or honeycombing. Findings may represent an interstitial lung disease such as nonspecific interstitial pneumonia (NSIP) or less likely early usual interstitial pneumonia (UIP). Findings are indeterminate for UIP per consensus guidelines: Diagnosis of Idiopathic Pulmonary Fibrosis: An Official ATS/ERS/JRS/ALAT Clinical Practice Guideline. [place], Iss 5, [phone], Feb 25 2017. 3. Moderate centrilobular emphysema with mild diffuse bronchial wall thickening, suggesting COPD. 4. Two-vessel coronary atherosclerosis. 5. Small hiatal hernia.   Aortic Atherosclerosis (ICD10-I70.0) and Emphysema (ICD10-J43.9). ROS - per HPI    OV 04/22/2019  Subjective:  Patient ID: [name], female , DOB: 1941/05/14 , age 31 y.o. , MRN: [id number] , ADDRESS: [name] [id number]   Follow-up slowly progressive interstitial lung disease with SCL-70 antibody positive and Raynud - IPAF - on myfortic since march 2019 with pred 5 and bactrim 3x/week  Follow-up associated emphysema - on spiriva   04/22/2019 -   Chief Complaint  Patient presents with   Follow-up    Patient reports that her breathing is doing well  at this time.      HPI ASHYRA CANTIN 81 y.o. -presents for follow-up.  Last seen by myself in early part of 2020 before the pandemic.  At that time CT chest suggested possible progression.  She saw a nurse practitioner in the interim after the onset of the pandemic.  She was anxious about the progression of her ILD so she asked for a CT scan in less than 1 year.  A CT scan of the chest was done in September 2020.  This is deemed stable since early part of the year but with possible progression versus stability since January 2019.  In terms of her symptoms she continues to be stable with very minimal shortness of breath and cough.  Her pulmonary function test below also shows stability.  Her walking desaturation test also shows stability.  She is tolerating her Myfortic, prednisone and 3 times a week of Bactrim just fine.  There are no side effects.  Last blood work was in August 2020 and this was reviewed.  We discussed the possibility of switching to antifibrotic nintedanib instead of taking immunosuppressants.  The rational for this would be it is a safer drug with less long-term side effect such as lack of immunosuppression.  However she feels her current regimen is working well for her.  She also feels that she would benefit more from a immunosuppressive drug because of her autoimmune antibodies and therefore she is more inclined to continue with her current immunosuppressive regimen of prednisone 5 mg/day and Myfortic and Bactrim 3 times a week.        OV 07/04/2019  Subjective:  Patient ID: [name], female , DOB: 04-22-1941 , age 26 y.o. , MRN: [id number] , ADDRESS: [place] [id number]   Follow-up mildy progressive - stable interstitial lung disease with SCL-70 antibody positive and Raynud -  IPAF - on myfortic since march 2019 with pred 5 and bactrim 3x/week  Follow-up associated emphysema  Follow-up Raynard  Has associated hemochromatosis  Has  associated atrial fibrillation on Xarelto   07/04/2019 -   Chief Complaint  Patient presents with   Follow-up    Pt states she has been doing well since last visit and denies any complaints.     HPI NEHAL WITTING 81 y.o. -last visit was in October 2020.  At that time she was doing well.  Given her continued immunosuppression with prednisone and Myfortic we discussed the possibility of switching the Myfortic to the nintedanib but understanding that there uncertainties whether this is superior approach and if she could end up with temporary side effects of the nintedanib.  Based on that she decided to continue with prednisone, Myfortic and Bactrim as she was doing because things are working well.  However she later called in and wanted this change to nintedanib.  We did this change to nintedanib but then her co-pay ended up being over 500+ dollars and then she started also getting worried about the side effects with nintedanib particularly GI.  She is also on Xarelto for atrial fibrillation.  Therefore she made this visit ahead of schedule to discuss the possibility of making the switch.  I understand from her that it will be 500+ dollars every month with nintedanib as a co-pay.  In addition she is worried about the superiority of taking nintedanib.  She fully understands that Myfortic over length of time can carry cancer risk and immunosuppression risk but at this point in time she feels well and she is tolerating it well.  She also understands that nintedanib can carry temporary GI side effects.  She also understands that it may not be a superior approach.  She understands it could be an equivalent approach and definitely lower immunosuppression.  Her main systemic immunosuppressive features are lung disease and Raynaud.  She is worried this might flareup.  In terms of her symptoms there is stable as documented below.  Her walking desaturation test is also stable.   Last pulmonary function test  was in August r 2020 Last CT scan of the chest was in September 2020 Last blood work October 2020 Last echocardiogram 2012.  ROS - per HPI  OV 10/22/2019  Subjective:  Patient ID: [name], female , DOB: [birth year] , age 70 y.o. , MRN: [id number] , ADDRESS: [place] [id number]   Follow-up mildy progressive - stable interstitial lung disease with SCL-70 antibody positive and Raynud - IPAF - on myfortic since march 2019 with pred 5 and bactrim 3x/week  Follow-up associated emphysema  Follow-up Raynaud  Has associated hemochromatosis  Has associated atrial fibrillation on Xarelto    10/22/2019 -   Chief Complaint  Patient presents with   Follow-up    PFT performed today. Pt states she has been doing good since last visit. Denies any complaints with breathing.     HPI BRAIDYN PEACE 81 y.o. -returns for routine follow-up.  Overall doing well.  She feels shortness of breath is stable.  She feels she is tolerating Myfortic and prednisone fine no new interim issues she has had a Covid vaccine.  She continues on high risk immunosuppressive therapy.  There is some mild diarrhea with the Myfortic but it is tolerable.  The shortness of breath scale is stable walking desaturation test is stable.  Her pulmonary function test shows stable FVC with  a possible reduction in diffusion capacity.  I do not know what to make of this at this point in time.  It could be a technical issue because she is feeling stable.  She did have echocardiogram that showed grade 2 diastolic dysfunction.   She continues on her Spiriva for associated emphysema.  ROS - per HPI     OV 03/03/2020   Subjective:  Patient ID: [name], female , DOB: 27-Jun-1941, age 48 y.o. years. 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 -returns for follow-up.  Overall she is doing well.  However in the last 1-2 months she says she is slightly more short of breath than usual.  She is also having slight increase in dry cough.  Based on her subjective symptom questionnaires it seems the cough itself is stable but she is definitely saying it is worse.  She is seeing the dyspnea is overall stable compared to the cough but looking at the symptom questionnaire is maybe it is a little worse.  Is hard to discern.  It has been approximately 1 year since she had a high-resolution CT chest.  She is wondering if she can have this repeated.  She is on immunosuppression.  She is also noticing -that at home her pulse oximetry on her finger varies a lot [she does have [name]]) and sometimes can be as low as 88% - 93%.  She does acknowledge that the circulation in her hands is not as good.  Pulse ox here at rest was normal.  She had pulmonary function test that shows stability.-With the FVC although I am not sure what to make of the DLCO.  She has had a Covid booster vaccine  Last echocardiogram January 2021 with mild diastolic dysfunction    IMPRESSION: 1. Very mild basilar predominant subpleural reticulation and ground-glass, similar to minimally progressive from 07/03/2017. Nonspecific interstitial pneumonitis is favored. Findings are indeterminate for UIP per consensus guidelines: Diagnosis of Idiopathic Pulmonary Fibrosis: An Official ATS/ERS/JRS/ALAT Clinical Practice Guideline. [place], Iss 5, [phone], Feb 25 2017. 2. Aortic atherosclerosis (ICD10-170.0). Coronary artery calcification. 3. Enlarged pulmonic trunk, indicative of pulmonary arterial hypertension. 4.  Emphysema (ICD10-J43.9).     Electronically Signed   By: Lorin Picket M.D.   On: 03/20/2019 14:47  Echocardiogram -January 2021: Shows grade 2 diastolic dysfunction but otherwise unremarkable.   ROS - per HPI  HPI [name] 81 y.o. -last visit was in September 2021.  She continues to do well overall.  Shortness of breath symptom question is stable.  Walking desaturation test is stable.  She is compliant with her medications.  In September 2021 there is concern that her ILD was getting worse because of change in PFT.  So we did a CT scan of the chest radiologist reported this to be stable since 2017.  Most recently she  had blood work in December 2021 chemistry panel is normal and I reviewed it.  CBC panel is also normal with stable mild anemia hemoglobin 12.4 g%.  She is up-to-date with her COVID vaccine including booster.  She had pulmonary function test today that showed some fluctuation.  Her main concern is that she is coughing more  In terms of her cough it is dry and has a barking quality to it.  Or laryngeal quality to it.  It is mostly at day and night but never wakes her up at night.  When she has it at night it is only when she wakes up.  Talking makes it worse.  Laughing makes it worse.  Drinking water makes it better.  She thinks it is progressive.  Symptom score showed the cough is getting worse and deep.  She has no fever or chills.  No desaturations.  We discussed the possibility of opportunistic infection but she rightfully questant if it could be opportunistic infection if everything else is stable.  There is no wheezing.  She is wondering if it could be because of allergies.  She has seasonal allergies.  Review of the records indicate this has not been  investigated.  She is willing to have an investigation for this.  We discussed the possibility of cough neuropathy.  She recollects has had gabapentin before when she had shingles.  She did not have any side effects from it.     CT Chest data sept 2021  TECHNIQUE: Multidetector CT imaging of the chest was performed following the standard protocol without intravenous contrast. High resolution imaging of the lungs, as well as inspiratory and expiratory imaging, was performed.   COMPARISON:  09/09/2019, 03/20/2019, 07/03/2017, 12/06/2016, 06/15/2016   FINDINGS: Cardiovascular: Aortic atherosclerosis. Normal heart size. Three-vessel coronary artery calcifications. No pericardial effusion.   Mediastinum/Nodes: No enlarged mediastinal, hilar, or axillary lymph nodes. Thyroid gland, trachea, and esophagus demonstrate no significant findings.   Lungs/Pleura: Moderate centrilobular emphysema. No significant interval change in subtle irregular peripheral interstitial opacity predominantly seen in the bilateral lung bases, although also noted in nondependent portions of the right middle lobe and lingula. Mild lobular air trapping on expiratory phase imaging. Stable, definitively benign and faintly rim calcified 6 mm nodule of the dependent left lower lobe (series 5, image 86). No pleural effusion or pneumothorax.   Upper Abdomen: No acute abnormality.   Musculoskeletal: No chest wall mass or suspicious bone lesions identified.   IMPRESSION: 1. No significant interval change in subtle irregular peripheral interstitial opacity predominantly seen in the bilateral lung bases, although also noted in nondependent portions of the right middle lobe and lingula. There is no appreciable change in these findings on multiple prior examinations dating back to 2017. Bland post infectious or inflammatory scarring is favored, although minimal fibrotic interstitial lung disease is not excluded and  if characterized by ATS pulmonary fibrosis criteria, findings are consistent with an early "indeterminate for UIP" pattern. Consider ongoing follow-up CT to assess for stability of fibrotic findings. Findings are indeterminate for UIP per consensus guidelines: Diagnosis of Idiopathic Pulmonary Fibrosis: An Official ATS/ERS/JRS/ALAT Clinical Practice Guideline. [place], Iss 5, [phone], Feb 25 2017. 2. Emphysema (ICD10-J43.9). 3. Coronary artery disease.  Aortic Atherosclerosis (ICD10-I70.0).     Electronically Signed   By: Eddie Candle M.D.   On: 03/16/2020 14:12  OV 02/23/2021  Subjective:  Patient ID: [name], female , DOB: December 17, 1940 , age 5 y.o. ,  MRN: [id number] , ADDRESS: [place] [address] PCP Kathyrn Lass, MD Patient Care Team: Kathyrn Lass, MD as PCP - General (Family Medicine) Deboraha Sprang, MD as PCP - Cardiology (Cardiology) Annia Belt, MD as Consulting Physician (Hematology) Deboraha Sprang, MD as Consulting Physician (Cardiology) Richmond Campbell, MD as Consulting Physician (Gastroenterology)  This Provider for this visit: Treatment Team:  Attending Provider: Brand Males, MD    02/23/2021 -   Chief Complaint  Patient presents with   Follow-up    Pt states she has been doing okay since last visit and denies any complaints.    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  Follow-up associated emphysema  Follow-up Raynaud  Has associated hemochromatosis  Has associated atrial fibrillation on Xarelto  Mild associated diastolic dysfunction and echo January 2021  HPI [name] 81 y.o. -returns for her 58-monthfollow-up.  Last visit with me was in February 2022.  Is   She continues on Myfortic prednisone and Bactrim.  We  held off on nintedanib given stability.  She did have spirometry in February 2022.?  There is a slight decline but she feels stable.  Symptom scores are stable.  Walking desaturation test is below and is stable versus a three-point drop that is?  Significant..  Last CT scan September 2021.  But clinically she feels stable.  We held off on nintedanib last visit because of stability.       OV 07/23/2021  Subjective:  Patient ID: PAnastasia [name] female , DOB: [birth year], age 81y.o. , MRN: [id number], [place]: [place] [id number] MKathyrn Lass MD Patient Care Team: MKathyrn Lass MD as PCP - General (Family Medicine) KDeboraha Sprang MD as PCP - Cardiology (Cardiology) GAnnia Belt MD as Consulting Physician (Hematology) KDeboraha Sprang MD as Consulting Physician (Cardiology) MRichmond Campbell MD as Consulting Physician (Gastroenterology)  This Provider for this visit: Treatment Team:  Attending Provider: RBrand Males MD    07/23/2021 -   Chief Complaint  Patient presents with   Follow-up    Pt states she has been doing okay and denies any complaints.    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  Follow-up associated emphysema  Follow-up Raynaud  Has associated hemochromatosis  Has associated atrial fibrillation on Xarelto  Mild associated diastolic dysfunction and echo January 2021  HPI PARADIA ESTEY89y.o. -returns for 624-monthollow-up.  Last seen in August 2022.  She feels since then dyspnea slightly worse.  This is reflected in the symptom score below.  In December 2022 he did have COVID and she recovered from it.  She does not think the decline is because of the COVID.  She continues on her prednisone Bactrim and Myfortic.  Her last CT scan of the chest was in  September 2021.  Last echocardiogram was in January 2021.  Last pulmonary function test was almost 1 year ago in February 2022.  She is wondering about repeat investigations.  She is concerned about worsening ILD.    Of note she just turned [age]ears old.  OV 09/09/2021  Subjective:  Patient ID: PaElmon Kirschner  Annamary Rummage, female , DOB: [birth year] , age 30 y.o. , MRN: [id number] , ADDRESS: [address] [place] [address] PCP Sigmund Hazel, MD Patient Care Team: Sigmund Hazel, MD as PCP - General (Family Medicine) Duke Salvia, MD as PCP - Cardiology (Cardiology) Levert Feinstein, MD as Consulting Physician (Hematology) Duke Salvia, MD as Consulting Physician (Cardiology) Sharrell Ku, MD as Consulting Physician (Gastroenterology)  This Provider for this visit: Treatment Team:  Attending Provider: Kalman Shan, MD    09/09/2021 -   Chief Complaint  Patient presents with   Follow-up    PFT performed today. Pt states she has been doing okay and denies any complaints.    HPI DENEENE TARVER 81 y.o. -returns for follow-up.  At this point in time she is feeling stable.  She had work-up to see if her ILD is worse or she has developed pulmonary hypertension.  CT scan shows stability since 2021.  Her pulmonary function test shows a 3% decline in DLCO.  On an average she has 3% decline per year over the last 4 years.  At this point in time she is feeling well.  No side effects from Myfortic.  She asked about options if she were to get worse.  We discussed about nintedanib.  Also indicated there is an injection [tocilizumab] but at this point in time she is fine with the choices.  She does understand Myfortic is immunosuppressant and there is also cancer risk.  She is decided to continue.  She also has mild emphysema for which she is on Spiriva and it is stable.  I gave her a book called Thursdays with Dennard Nip written by the patient who has pulmonary fibrosis and scleroderma.  She  is going to read it.  We did discuss briefly clinical trials as a care option and she is interested  -No clearing of the throat.  -She has seen rheumatology recently.  She continues to have [name].  They have a nitroglycerin.  She does not want to try it.  We discussed about using heart warmers.  She is going to buy this   CT Chest data  XR C-ARM NO REPORT  Result Date: 09/07/2021 Please see Notes tab for imaging impression.      OV 04/21/2022  Subjective:  Patient ID: [name], female , DOB: [birth year] , age 43 y.o. , MRN: [id number] , ADDRESS: [address] [place] [address] PCP Sigmund Hazel, MD Patient Care Team: Sigmund Hazel, MD as PCP - General (Family Medicine) Duke Salvia, MD as PCP - Cardiology (Cardiology) Levert Feinstein, MD as Consulting Physician (Hematology) Duke Salvia, MD as Consulting Physician (Cardiology) Sharrell Ku, MD as Consulting Physician (Gastroenterology)  This Provider for this visit: Treatment Team:  Attending Provider: Kalman Shan, MD   Follow-up mildy sllowy progressive - stable interstitial lung disease with SCL-70 antibody positive, atnticardiolipnd abd + and Raynud - IPAF - on myfortic since march 2019 with pred 5 and bactrim 3x/week  -Last CT feb 2023 without progerssion.  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  Has associated hemochromatosis  Has associated atrial fibrillation on Xarelto  Mild associated diastolic dysfunction in pst but not on echo  Feb 2023  04/21/2022 -   Chief Complaint  Patient presents with   Follow-up    6 Follow-up PT states no changes, DOE (upstairs, fast paced walking)  HPI GELISA TIEKEN 81 y.o. -returns for follow-up.  She tells me that since last seeing me since the summer or so she is slightly more short of breath especially when she climbs stairs  but overall symptom score is stable.  In September 2023 she went on vacation to [facility] and came back with her girlfriends and she had a great time but she did notice more shortness of breath than usual.  She attributed this to heavier exertion but nevertheless with stairs she is finding herself more short of breath.  She is compliant with Spiriva for associated emphysema Myfortic Bactrim and daily prednisone.  2 days ago she saw Hazel Sams rheumatology PA for her scleroderma and has been deemed stable  Pulmonary function test shows slight reduction in FVC over time but still within normal limits but the DLCO is dramatically low.  There is no anemia correction on the DLCO.  When I did a rough anemia correction it appears that DLCO might be okay.  Of asked for a formal correction on the DLCO for anemia.  Given her anemia which appears to be relatively more pronounced compared to 11/12/21 greater than 1.5 g% drop she has been referred by rheumatology to hematology because of history of hemochromatosis.  Her last echo and CT scan of the chest were earlier this year  We discussed about the 3 different respiratory vaccines.  Strongly recommended COVID mRNA because she is immunocompromised.  She did have outpatient COVID December 2022 but it was a different strain.  Also recommend high-dose flu shot which she will get today and we discussed RSV vaccine.    SYMPTOM SCALE - ILD 08/14/2018  04/22/2019  07/04/2019  10/22/2019  03/03/2020 139# 08/18/2020  02/23/2021  07/23/2021 Covid dec 2022 04/21/2022   O2 use *RA          Shortness of Breath 0 -> 5 scale with 5 being worst (score 6 If unable to do)          At rest 1 0 0 0.5 1 0 0.5 1 0  Simple tasks - showers, clothes change, eating, shaving 1.5 0 .5 0.$Remo'5 1 1 'IggNH$ 0.5 1.5 0  Household (dishes, doing bed, laundry) 1.5 1 0.5 0.$Remov'5 1 1 'KgMkNx$ 0.5 1.5 1  Shopping 1.5 0 0/5 0.$Remov'5 1 1 1 1 'xqmPGY$ 0  Walking keeping up with others of same age 19 0 0.5 0.5 1.5 1.$RemoveB'5 1 1 'EIqLYEbs$ 0   Walking up Stairs 2.5 1 0.5 0/5 1.5 1.5 1.$RemoveB'5 2 1  'gqTVqnXw$ Total (40 - 48) Dyspnea Score 10- 2 2.$Remov'5 3 7 6 5 8 2  'UjJktl$ How bad is your cough? 1.5 1.5 0.5 1 1.5 3 1.$Remov'5 2 1  'fAdJqt$ How bad is your fatigue 1.$RemoveBefore'5 1 1 2 'dfKJerjaSucuQ$ 1.5 2 1.$Remov'5 2 2  'rQnQTn$ nausea    0 0 0.5 0 0 0  vomit    0 0 0 0 0 0  dairrhea    2.5 0 0.5 1.$Remov'5 3 1  'xRBtRo$ anxiety    0 0 0 0 0 0  deoresoj    0 0 0 0 0 0    Simple office walk 185 feet x  3 laps goal with forehead probe 07/04/2019  10/22/2019  03/03/2020 Finger probe 02/23/2021  04/21/2022   O2 used ra ra ra  ra ra  Number laps completed $RemoveBefore'3 3 3    'dwJklBovgJKDd$ Comments about pace avg  avg  Mod seteady pace  Resting Pulse Ox/HR 98% and 74/min 100% and 66 96%  and 80 99% and 69 99% and HR 65  Final Pulse Ox/HR 98% and 87/min 97% and 81 95% and 83 96% and 78 96% and HR 82  Desaturated </= 88% no no no  no  Desaturated <= 3% points no no no yes Yes 3 pnts  Got Tachycardic >/= 90/min no no no  no  Symptoms at end of test Mild dyspnea Very mild dyspnea no no none  Miscellaneous comments x    stable   CT Chest data  No results found.    PFT     Latest Ref Rng & Units 04/12/2022   10:27 AM 09/09/2021   10:46 AM 08/18/2020    2:49 PM 02/28/2020    4:16 PM 10/22/2019   11:28 AM 02/18/2019    2:10 PM 08/14/2018    1:28 PM  PFT Results  FVC-Pre L 2.48  P 2.67  2.78  2.90  2.83  2.82  2.92   FVC-Predicted Pre % 96  P 103  105  108  106  104  108   Pre FEV1/FVC % % 72  P 68  68  66  69  67  67   FEV1-Pre L 1.80  P 1.81  1.88  1.92  1.96  1.89  1.95   FEV1-Predicted Pre % 93  P 94  96  96  98  93  96   DLCO uncorrected ml/min/mmHg 9.26  P 11.33  11.74  11.88  12.46  15.31  13.19   DLCO UNC% % 49  P 60  62  63  66  81  70   DLCO corrected ml/min/mmHg 9.26  P 12.46  11.74  11.88  12.46     DLCO COR %Predicted % 49  P 66  62  63  66     DLVA Predicted % 57  P 70  65  66  73  79  68     P Preliminary result       has a past medical history of Atrial fibrillation ([place]), Chest pain, Depression with anxiety, GERD (gastroesophageal  reflux disease), Hemochromatosis, History of syncope, and Hypothyroidism.   reports that she quit smoking about 24 years ago. Her smoking use included cigarettes. She has a 42.00 pack-year smoking history. She has never been exposed to tobacco smoke. She has never used smokeless tobacco.  Past Surgical History:  Procedure Laterality Date   MOUTH SURGERY     SHOULDER ARTHROSCOPY Left    TOTAL ABDOMINAL HYSTERECTOMY     VARICOSE VEIN SURGERY      No Known Allergies  Immunization History  Administered Date(s) Administered   Fluad Quad(high Dose 65+) 02/18/2019   Influenza Split 03/27/2019   Influenza, High Dose Seasonal PF 08/29/2017, 04/26/2018, 04/29/2020, 05/12/2021   PFIZER(Purple Top)SARS-COV-2 Vaccination 07/22/2019, 08/12/2019, 03/11/2020   Pneumococcal Conjugate-13 06/12/2014   Pneumococcal Polysaccharide-23 06/27/2010, 04/25/2018   Tdap 03/16/2011, 07/05/2019   Zoster Recombinat (Shingrix) 04/08/2019   Zoster, Live 06/27/2010, 04/08/2019    Family History  Problem Relation Age of Onset   Stroke Mother    Heart disease Father 61   Hemochromatosis Brother    Breast cancer Neg Hx      Current Outpatient Medications:    acetaminophen (TYLENOL) 325 MG tablet, Take 650 mg by mouth every 6 (six) hours as needed for mild pain or headache., Disp: , Rfl:    apixaban (ELIQUIS) 5 MG TABS tablet, TAKE 1 TABLET(5 MG) BY MOUTH TWICE DAILY, Disp: 180 tablet,  Rfl: 2   atorvastatin (LIPITOR) 10 MG tablet, Take 10 mg by mouth daily., Disp: , Rfl:    Cyanocobalamin (VITAMIN B 12 PO), Take 1,000 mcg by mouth daily., Disp: , Rfl:    flecainide (TAMBOCOR) 100 MG tablet, Take 1 tablet (100 mg total) by mouth 2 (two) times daily., Disp: 180 tablet, Rfl: 3   folic acid (FOLVITE) 1 MG tablet, Take 1 mg by mouth daily., Disp: , Rfl:    levothyroxine (SYNTHROID, LEVOTHROID) 112 MCG tablet, Take 112 mcg by mouth daily., Disp: , Rfl:    methocarbamol (ROBAXIN) 500 MG tablet, Take 1 tablet (500 mg  total) by mouth daily as needed for muscle spasms., Disp: 30 tablet, Rfl: 2   metoprolol succinate (TOPROL-XL) 25 MG 24 hr tablet, TAKE 1 TABLET BY MOUTH  DAILY, Disp: 90 tablet, Rfl: 2   mycophenolate (MYFORTIC) 360 MG TBEC EC tablet, TAKE 1 TABLET BY MOUTH TWICE DAILY, Disp: 180 tablet, Rfl: 3   nitroGLYCERIN (NITROGLYN) 2 % ointment, Apply 1/4 inch to fingertips three times daily as needed., Disp: 30 g, Rfl: 1   ondansetron (ZOFRAN ODT) 4 MG disintegrating tablet, Take 1-2 tablets three times daily as needed, Disp: 42 tablet, Rfl: 1   pantoprazole (PROTONIX) 20 MG tablet, Take 20 mg by mouth 2 (two) times daily., Disp: , Rfl:    predniSONE (DELTASONE) 5 MG tablet, TAKE 1 TABLET(5 MG) BY MOUTH DAILY WITH BREAKFAST, Disp: 90 tablet, Rfl: 0   SPIRIVA RESPIMAT 2.5 MCG/ACT AERS, INHALE 2 PUFFS INTO THE LUNGS DAILY, Disp: 4 g, Rfl: 10   sulfamethoxazole-trimethoprim (BACTRIM DS) 800-160 MG tablet, TAKE 1 TABLET BY MOUTH ON MONDAY, WEDNESDAY, AND FRIDAY, Disp: 90 tablet, Rfl: 3   VITAMIN D PO, Take 5,000 Units by mouth 4 (four) times a week., Disp: , Rfl:       Objective:   Vitals:   04/21/22 0857  BP: (!) 106/58  Pulse: 66  Temp: 98.4 F (36.9 C)  TempSrc: Oral  SpO2: 97%  Weight: 141 lb (64 kg)  Height: 5' 3.5" (1.613 m)    Estimated body mass index is 24.59 kg/m as calculated from the following:   Height as of this encounter: 5' 3.5" (1.613 m).   Weight as of this encounter: 141 lb (64 kg).  '@WEIGHTCHANGE'$ @  [employer]   04/21/22 0857  Weight: 141 lb (64 kg)     Physical Exam    General: No distress. Looks well Neuro: Alert and Oriented x 3. GCS 15. Speech normal Psych: Pleasant Resp:  Barrel Chest - no.  Wheeze - no, Crackles - no, No overt respiratory distress CVS: Normal heart sounds. Murmurs - no Ext: Stigmata of Connective Tissue Disease - SCLEORDERMA HEENT: Normal upper airway. PEERL +. No post nasal drip        Assessment:       ICD-10-CM   1. ILD  (interstitial lung disease) ([place])  J84.9     2. 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 High risk medication use  Z79.899     4. [name] phenomenon without gangrene  I73.00     5. Scleroderma ([place])  M34.9     6. Centrilobular emphysema ([place])  J43.2     7. Chronic cough  R05.3     8. Vaccine counseling  Z71.85     9. Flu vaccine need  Z23          Plan:     Patient Instructions  ICD-10-CM   1. ILD (interstitial lung disease) ([place])  J84.9     2. 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 High risk medication use  Z79.899     4. [name] phenomenon without gangrene  I73.00     5. Scleroderma ([place])  M34.9     6. Centrilobular emphysema ([place])  J43.2     7. Chronic cough  R05.3     8. Vaccine counseling  Z71.85     9. Flu vaccine need  Z23         Interstitial pulmonary disease ([place]) Scl-70 antibody positive [name] phenomenon without gangrene High risk medication use Therapeutic drug monitoring  Symptoms slightly worse. Could be because of mild anemia that is new since May 2023. But need to ensure ILD is not worse  Labs with rheum ok except anemia  Plan  - cotninue  prednisone to 5 mg per day +  myfortic and bactrim at current dose - address anemia with Dr Alvy Bimler  - appreciate future interest in clinical trials - do spirometry and dlco in 6 months - connect with Butch Penny for support group - do spirometry and dlco in Jan 2024 - do ECHO in Jan 2024 - do HRCT in Jan 2024  Pulmonary emphysema, unspecified emphysema type ([place])  Plan -  Continue spiriva  Chronic cough Irritable Larynx History of seasonal allergies  -Based on history I suspect chronic cough is due to cough neuropathy otherwise call irritable larynx.  But he has seasonal allergies playing a role in this is open question. Other reasons  interstitial lung disease [as above and stable] the underlying emphysema [stable]  - Glad currently very mild and improved  Plan -expectant  followup  Vaccine counsleing  - now there is new covid strain and natural immunity from dec 2022 has probably waned  Plan  - hig dose flu shot 04/21/2022 - RSV and covid mRNA on your own   Followup -Jan 2024  with Dr Chase Caller - 30 min visit but after spiro/dlco, echo and HRCT  - symptom score at followup  High complex medical condition requiring high risk prescription with intensive therapeutic monitoring requirement   SIGNATURE    Dr. Brand Males, M.D., F.C.C.P,  Pulmonary and Critical Care Medicine Staff Physician, [place] Director - Interstitial Lung Disease  Program  Pulmonary [place] at [place], [place], [address]  Pager: [phone], If no answer or between  15:00h - 7:00h: call [phone] Telephone: [phone]  9:31 AM 04/21/2022

## 2022-04-21 NOTE — Patient Instructions (Addendum)
ICD-10-CM   1. ILD (interstitial lung disease) ([place])  J84.9     2. 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 High risk medication use  Z79.899     4. [name] phenomenon without gangrene  I73.00     5. Scleroderma ([place])  M34.9     6. Centrilobular emphysema ([place])  J43.2     7. Chronic cough  R05.3     8. Vaccine counseling  Z71.85     9. 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 Could be because of mild anemia that is new since May 2023. But need to ensure ILD is not worse  Labs with rheum ok except anemia  Plan  - cotninue  prednisone to 5 mg per day +  myfortic and bactrim at current dose - address anemia with Dr Alvy Bimler  - appreciate future interest in clinical trials - do spirometry and dlco in 6 months - connect with Butch Penny for support group - do spirometry and dlco in Jan 2024 - do ECHO in Jan 2024 - do HRCT in Jan 2024  Pulmonary emphysema, unspecified emphysema type ([place])  Plan -  Continue spiriva  Chronic cough Irritable Larynx History of seasonal allergies  -Based on history I suspect chronic cough is due to cough neuropathy otherwise call irritable larynx.  But he has seasonal allergies playing a role in this is open question. Other reasons  interstitial lung disease [as above and stable] the underlying emphysema [stable]  - Glad currently very mild and improved  Plan -expectant followup  Vaccine counsleing  - now there is new covid strain and natural immunity from dec 2022 has probably waned  Plan  - hig dose flu shot 04/21/2022 - RSV and covid mRNA on your own   Followup -Jan 2024  with Dr Chase Caller - 30 min visit but after spiro/dlco, echo and HRCT  - symptom score at followup

## 2022-04-21 NOTE — Progress Notes (Signed)
Anti-cardiolipin IgA and beta-2 glycoprotein IgA remain positive-stable.

## 2022-05-04 ENCOUNTER — Telehealth: Payer: Self-pay | Admitting: *Deleted

## 2022-05-04 NOTE — Telephone Encounter (Signed)
Received DEXA results from [facility].  Date of Scan: 05/02/2022  Lowest T-score:-2.9  BMD:0.528  Lowest site measured: Left Femoral Neck  DX: Osteoporosis   Significant changes in BMD and site measured (5% and above):n/a  Current Regimen:Vitamin D  Recommendation:Schedule an appointment to discuss.   Reviewed by: Dr. Bo Merino   Next Appointment:  10/21/2022  Attempted to contact the patient and left message for patient to call the office.

## 2022-05-04 NOTE — Telephone Encounter (Signed)
Patient returned call. Scheduled patient to discuss DEXA can and treatment options.

## 2022-05-09 ENCOUNTER — Inpatient Hospital Stay: Payer: Medicare Other

## 2022-05-09 ENCOUNTER — Other Ambulatory Visit: Payer: Self-pay

## 2022-05-09 ENCOUNTER — Inpatient Hospital Stay: Payer: Medicare Other | Attending: Hematology and Oncology | Admitting: Hematology and Oncology

## 2022-05-09 VITALS — BP 120/67 | HR 66 | Temp 97.5°F | Resp 14 | Wt 141.4 lb

## 2022-05-09 DIAGNOSIS — Z7901 Long term (current) use of anticoagulants: Secondary | ICD-10-CM | POA: Diagnosis not present

## 2022-05-09 DIAGNOSIS — D509 Iron deficiency anemia, unspecified: Secondary | ICD-10-CM | POA: Diagnosis present

## 2022-05-09 DIAGNOSIS — Z79890 Hormone replacement therapy (postmenopausal): Secondary | ICD-10-CM | POA: Insufficient documentation

## 2022-05-09 DIAGNOSIS — Z87891 Personal history of nicotine dependence: Secondary | ICD-10-CM | POA: Insufficient documentation

## 2022-05-09 DIAGNOSIS — J8410 Pulmonary fibrosis, unspecified: Secondary | ICD-10-CM | POA: Diagnosis not present

## 2022-05-09 DIAGNOSIS — D500 Iron deficiency anemia secondary to blood loss (chronic): Secondary | ICD-10-CM

## 2022-05-09 DIAGNOSIS — Z7952 Long term (current) use of systemic steroids: Secondary | ICD-10-CM | POA: Diagnosis not present

## 2022-05-09 DIAGNOSIS — I4891 Unspecified atrial fibrillation: Secondary | ICD-10-CM | POA: Diagnosis not present

## 2022-05-09 DIAGNOSIS — Z79624 Long term (current) use of inhibitors of nucleotide synthesis: Secondary | ICD-10-CM | POA: Insufficient documentation

## 2022-05-09 DIAGNOSIS — K219 Gastro-esophageal reflux disease without esophagitis: Secondary | ICD-10-CM | POA: Insufficient documentation

## 2022-05-09 LAB — CBC WITH DIFFERENTIAL (CANCER CENTER ONLY)
Abs Immature Granulocytes: 0.04 10*3/uL (ref 0.00–0.07)
Basophils Absolute: 0 10*3/uL (ref 0.0–0.1)
Basophils Relative: 0 %
Eosinophils Absolute: 0 10*3/uL (ref 0.0–0.5)
Eosinophils Relative: 0 %
HCT: 30.7 % — ABNORMAL LOW (ref 36.0–46.0)
Hemoglobin: 9.9 g/dL — ABNORMAL LOW (ref 12.0–15.0)
Immature Granulocytes: 1 %
Lymphocytes Relative: 11 %
Lymphs Abs: 0.8 10*3/uL (ref 0.7–4.0)
MCH: 30.4 pg (ref 26.0–34.0)
MCHC: 32.2 g/dL (ref 30.0–36.0)
MCV: 94.2 fL (ref 80.0–100.0)
Monocytes Absolute: 0.4 10*3/uL (ref 0.1–1.0)
Monocytes Relative: 6 %
Neutro Abs: 5.5 10*3/uL (ref 1.7–7.7)
Neutrophils Relative %: 82 %
Platelet Count: 340 10*3/uL (ref 150–400)
RBC: 3.26 MIL/uL — ABNORMAL LOW (ref 3.87–5.11)
RDW: 15.3 % (ref 11.5–15.5)
WBC Count: 6.7 10*3/uL (ref 4.0–10.5)
nRBC: 0 % (ref 0.0–0.2)

## 2022-05-09 LAB — RETIC PANEL
Immature Retic Fract: 27 % — ABNORMAL HIGH (ref 2.3–15.9)
RBC.: 3.22 MIL/uL — ABNORMAL LOW (ref 3.87–5.11)
Retic Count, Absolute: 53.5 10*3/uL (ref 19.0–186.0)
Retic Ct Pct: 1.7 % (ref 0.4–3.1)
Reticulocyte Hemoglobin: 28.3 pg (ref >27.9)

## 2022-05-09 LAB — CMP (CANCER CENTER ONLY)
ALT: 10 U/L (ref 0–44)
AST: 12 U/L — ABNORMAL LOW (ref 15–41)
Albumin: 4.5 g/dL (ref 3.5–5.0)
Alkaline Phosphatase: 56 U/L (ref 38–126)
Anion gap: 6 (ref 5–15)
BUN: 14 mg/dL (ref 8–23)
CO2: 30 mmol/L (ref 22–32)
Calcium: 9.3 mg/dL (ref 8.9–10.3)
Chloride: 103 mmol/L (ref 98–111)
Creatinine: 0.84 mg/dL (ref 0.44–1.00)
GFR, Estimated: >60 mL/min (ref >60)
Glucose, Bld: 120 mg/dL — ABNORMAL HIGH (ref 70–99)
Potassium: 3.9 mmol/L (ref 3.5–5.1)
Sodium: 139 mmol/L (ref 135–145)
Total Bilirubin: 0.4 mg/dL (ref 0.3–1.2)
Total Protein: 7 g/dL (ref 6.5–8.1)

## 2022-05-09 LAB — VITAMIN B12: Vitamin B-12: 646 pg/mL (ref 180–914)

## 2022-05-09 LAB — IRON AND IRON BINDING CAPACITY (CC-WL,HP ONLY)
Iron: 27 ug/dL — ABNORMAL LOW (ref 28–170)
Saturation Ratios: 7 % — ABNORMAL LOW (ref 10.4–31.8)
TIBC: 365 ug/dL (ref 250–450)
UIBC: 338 ug/dL (ref 148–442)

## 2022-05-09 LAB — FERRITIN: Ferritin: 6 ng/mL — ABNORMAL LOW (ref 11–307)

## 2022-05-09 NOTE — Progress Notes (Signed)
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 Beryle Beams 05/09/2022: establish care with Dr. Lorenso Courier   CHIEF COMPLAINTS/PURPOSE OF CONSULTATION:  "Iron Deficiency Anemia "  HISTORY OF PRESENTING ILLNESS:  [name] 81 y.o. female with medical history significant for atrial fibrillation, GERD, and hemochromatosis who presents for evaluation of low ferritin.   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 Beryle Beams.  She last saw him on 09/11/2017.  She had not required phlebotomies for several years, with her last one being in 2012.  Her last labs collected on 04/18/2022 showed a white blood cell count 8.1, hemoglobin 10.2, MCV 93.8, and platelets of 337.  Ferritin at the time was noted to be 6.  Due to concern for iron deficiency anemia in the setting of predatory hemochromatosis the patient was sent to hematology for further evaluation and management.  On exam today [name] reports that she does have a history of pernicious anemia currently being treated with over-the-counter vitamin B12 1000 mcg.  She reports that she previously got injections of vitamin B12 monthly but is currently on p.o. therapy.  She notes that her energy is currently an 8 out of 10.  She notes that she does have a history of pulmonary fibrosis and sleep apnea and so she does have baseline level shortness of breath and cough.  She  reports that she does not eat red meat and does not eat much in the way of green leafy vegetables.  She denies any overt signs of bleeding, bruising, or dark stools.  Her family history is remarkable for extensive hereditary hemochromatosis affecting multiple family members including her 3 daughters.  She reports that she is a former smoker having quit 24 years ago.  She also drinks alcohol socially.  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  She currently denies any fevers, chills, sweats, nausea, vomiting or diarrhea.  A full 10 point ROS was otherwise negative.  MEDICAL HISTORY:  Past Medical History:  Diagnosis Date   Atrial fibrillation ([place])    a. Flecainide therapy;  b. event monitor 4/12   Chest pain    a. GXT myoview 4/12: no isch., EF 86%;   b. 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 Beryle Beams   History of syncope    Hypothyroidism     SURGICAL HISTORY: Past Surgical History:  Procedure Laterality Date   MOUTH SURGERY     SHOULDER ARTHROSCOPY Left    TOTAL ABDOMINAL HYSTERECTOMY     VARICOSE VEIN SURGERY      SOCIAL HISTORY: Social History   Socioeconomic History   Marital status: Married    Spouse name: Not on file   Number of children: Not on file   Years of education: Not on file   Highest education level: Not on file  Occupational History   Occupation:  RETIRED    Employer: RETIRED  Tobacco Use   Smoking status: Former    Packs/day: 1.00    Years: 42.00    Total pack years: 42.00    Types: Cigarettes    Quit date: 06/27/1997    Years since quitting: 24.8    Passive exposure: Never   Smokeless tobacco: Never  Vaping Use   Vaping Use: Never used  Substance and Sexual Activity   Alcohol use: Yes    Comment: occ   Drug use: No   Sexual activity: Not on file  Other Topics  Concern   Not on file  Social History Narrative   REGULAR EXERCISE   Social Determinants of Health   Financial Resource Strain: Not on file  Food Insecurity: Not on file  Transportation Needs: Not on file  Physical Activity: Not on file  Stress: Not on file  Social Connections: Not on file  Intimate Partner Violence: Not on file    FAMILY HISTORY: Family History  Problem Relation Age of Onset   Stroke Mother    Heart disease Father 46   Hemochromatosis Brother    Breast cancer Neg Hx     ALLERGIES:  has No Known Allergies.  MEDICATIONS:  Current Outpatient Medications  Medication Sig Dispense Refill   Vitamin D, Cholecalciferol, 25 MCG (1000 UT) TABS Take 1 tablet by mouth daily.     acetaminophen (TYLENOL) 325 MG tablet Take 650 mg by mouth every 6 (six) hours as needed for mild pain or headache.     apixaban (ELIQUIS) 5 MG TABS tablet TAKE 1 TABLET(5 MG) BY MOUTH TWICE DAILY 180 tablet 2   atorvastatin (LIPITOR) 10 MG tablet Take 10 mg by mouth daily.     Cyanocobalamin (VITAMIN B 12 PO) Take 1,000 mcg by mouth daily.     flecainide (TAMBOCOR) 100 MG tablet Take 1 tablet (100 mg total) by mouth 2 (two) times daily. 098 tablet 3   folic acid (FOLVITE) 1 MG tablet Take 1 mg by mouth daily.     levothyroxine (SYNTHROID, LEVOTHROID) 112 MCG tablet Take 112 mcg by mouth daily.     methocarbamol (ROBAXIN) 500 MG tablet Take 1 tablet (500 mg total) by mouth daily as needed for muscle spasms. 30 tablet 2   metoprolol succinate (TOPROL-XL) 25 MG 24 hr tablet TAKE 1 TABLET BY MOUTH  DAILY 90 tablet 2   mycophenolate (MYFORTIC) 360 MG TBEC EC tablet TAKE 1 TABLET BY MOUTH TWICE DAILY 180 tablet 3   nitroGLYCERIN (NITROGLYN) 2 % ointment Apply 1/4 inch to fingertips three times daily as needed. 30 g 1   ondansetron (ZOFRAN ODT) 4 MG disintegrating tablet Take 1-2 tablets three times daily as needed 42 tablet 1   pantoprazole (PROTONIX) 20 MG tablet Take 20 mg by mouth 2 (two) times  daily.     predniSONE (DELTASONE) 5 MG tablet TAKE 1 TABLET(5 MG) BY MOUTH DAILY WITH BREAKFAST 90 tablet 0   SPIRIVA RESPIMAT 2.5 MCG/ACT AERS INHALE 2 PUFFS INTO THE LUNGS DAILY 4 g 10   sulfamethoxazole-trimethoprim (BACTRIM DS) 800-160 MG tablet TAKE 1 TABLET BY MOUTH ON MONDAY, WEDNESDAY, AND FRIDAY 90 tablet 3   No current facility-administered medications for this visit.    REVIEW OF SYSTEMS:   Constitutional: ( - ) fevers, ( - )  chills , ( - ) night sweats Eyes: ( - ) blurriness of vision, ( - ) double vision, ( - ) watery eyes Ears, nose, mouth, throat, and  face: ( - ) mucositis, ( - ) sore throat Respiratory: ( - ) cough, ( - ) dyspnea, ( - ) wheezes Cardiovascular: ( - ) palpitation, ( - ) chest discomfort, ( - ) lower extremity swelling Gastrointestinal:  ( - ) nausea, ( - ) heartburn, ( - ) change in bowel habits Skin: ( - ) abnormal skin rashes Lymphatics: ( - ) new lymphadenopathy, ( - ) easy bruising Neurological: ( - ) numbness, ( - ) tingling, ( - ) new weaknesses Behavioral/Psych: ( - ) mood change, ( - ) new changes  All other systems were reviewed with the patient and are negative.  PHYSICAL EXAMINATION:  Vitals:   05/09/22 1313  BP: 120/67  Pulse: 66  Resp: 14  Temp: (!) 97.5 F (36.4 C)  SpO2: 96%   Filed Weights   05/09/22 1313  Weight: 141 lb 6.4 oz (64.1 kg)    GENERAL: well appearing elderly Caucasian female in NAD  SKIN: skin color, texture, turgor are normal, no rashes or significant lesions EYES: conjunctiva are pink and non-injected, sclera clear LUNGS: clear to auscultation and percussion with normal breathing effort HEART: regular rate & rhythm and no murmurs and no lower extremity edema Musculoskeletal: no cyanosis of digits and no clubbing  PSYCH: alert & oriented x 3, fluent speech NEURO: no focal motor/sensory deficits  LABORATORY DATA:  I have reviewed the data as listed    Latest Ref Rng & Units 04/18/2022   10:09 AM 11/16/2021    10:22 AM 07/23/2021    2:46 PM  CBC  WBC 3.8 - 10.8 Thousand/uL 8.1  10.7  5.8   Hemoglobin 11.7 - 15.5 g/dL 10.2  11.8  10.8   Hematocrit 35.0 - 45.0 % 31.8  36.7  33.1   Platelets 140 - 400 Thousand/uL 337  300  215.0        Latest Ref Rng & Units 04/18/2022   10:09 AM 11/16/2021   10:22 AM 07/23/2021    2:46 PM  CMP  Glucose 65 - 99 mg/dL 89  91  84   BUN 7 - 25 mg/dL '15  18  17   '$ Creatinine 0.60 - 0.95 mg/dL 0.63  0.75  0.81   Sodium 135 - 146 mmol/L 140  139  138   Potassium 3.5 - 5.3 mmol/L 4.3  4.3  4.1   Chloride 98 - 110 mmol/L 103  101  102   CO2 20 - 32 mmol/L '27  28  31   '$ Calcium 8.6 - 10.4 mg/dL 9.1  9.8  9.3   Total Protein 6.1 - 8.1 g/dL 6.2  6.6  6.4   Total Bilirubin 0.2 - 1.2 mg/dL 0.3  0.5  0.4   Alkaline Phos 39 - 117 U/L   43   AST 10 - 35 U/L '13  13  13   '$ ALT 6 - 29 U/L '9  13  9      '$ ASSESSMENT & PLAN [name] 81 y.o. female with medical history significant for atrial fibrillation, GERD, and hemochromatosis who presents for evaluation of low ferritin.   After review of the labs, review of the records, and discussion with the patient the patients findings are most consistent with iron deficiency anemia complicated by hereditary hemochromatosis.  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  She is not undergone phlebotomy since 2012. -- We  will conduct a full nutritional work-up to include vitamin B12, folate, methylmalonic acid, and iron panel with ferritin. -- Due to anemia in the setting of further hemochromatosis if she is iron deficient we will need to replete her with oral therapy gently. -- Plan to have the patient return to clinic pending results of above studies.  No orders of the defined types were placed in this encounter.   All questions were answered. The patient knows to call the clinic with any problems,  questions or concerns.  A total of more than 60 minutes were spent on this encounter with face-to-face time and non-face-to-face time, including preparing to see the patient, ordering tests and/or medications, counseling the patient and coordination of care as outlined above.   Ledell Peoples, MD Department of Hematology/Oncology [place] at [facility] Phone: [phone] Pager: [phone] Email: [email or web address]  05/09/2022 1:22 PM

## 2022-05-10 LAB — FOLATE: Folate: 35.5 ng/mL (ref >5.9)

## 2022-05-12 ENCOUNTER — Telehealth: Payer: Self-pay | Admitting: *Deleted

## 2022-05-12 MED ORDER — FERROUS SULFATE 325 (65 FE) MG PO TBEC: 325.0000 mg | DELAYED_RELEASE_TABLET | Freq: Every day | ORAL | 3 refills | Status: DC

## 2022-05-12 NOTE — Telephone Encounter (Signed)
Communicated lab values to patient.  She expressed understanding and did ask for Rx to her pharmacy on file.  Ordered.  Lab and MD visit scheduling request processed as well.

## 2022-05-12 NOTE — Telephone Encounter (Signed)
-----   Message from Otila Kluver, RN sent at 05/12/2022  4:07 PM EST ----- Regarding: FW: P2  ----- Message ----- From: Orson Slick, MD Sent: 05/10/2022   4:30 PM EST To: Otila Kluver, RN Subject: P2                                             Please let Ms. Lycan know that her labs are consistent with a severe iron deficiency anemia.  At this time would recommend that she start p.o. iron therapy 325 mg daily.  I would like to follow closely to make sure we do not iron overload her given her history of hereditary hemochromatosis.  We will check labs in 4 weeks time and see her back in clinic in 8 weeks time.  Please call this iron into a pharmacy of her choosing.  ----- Message ----- From: Buel Ream, Lab [facility] [place] Sent: 05/09/2022   2:24 PM EST To: Orson Slick, MD

## 2022-05-16 ENCOUNTER — Telehealth: Payer: Self-pay | Admitting: Hematology and Oncology

## 2022-05-16 NOTE — Telephone Encounter (Signed)
Called patient per 11/20 in basket. Left voicemail of new upcoming appointments.

## 2022-05-18 LAB — METHYLMALONIC ACID, SERUM: Methylmalonic Acid, Quantitative: 221 nmol/L (ref 0–378)

## 2022-05-24 ENCOUNTER — Encounter: Payer: Self-pay | Admitting: Physician Assistant

## 2022-05-24 ENCOUNTER — Telehealth: Payer: Self-pay | Admitting: Internal Medicine

## 2022-05-24 MED ORDER — DOXYCYCLINE HYCLATE 100 MG PO TABS: 100.0000 mg | ORAL_TABLET | Freq: Two times a day (BID) | ORAL | 0 refills | Status: DC

## 2022-05-24 MED ORDER — PREDNISONE 10 MG PO TABS: ORAL_TABLET | ORAL | 0 refills | Status: DC

## 2022-05-24 NOTE — Telephone Encounter (Signed)
Patient monitoring oxygen levels?  If there is colored sputum - then recommend Take doxycycline '100mg'$  po twice daily x 5 days; take after meals and avoid sunlight  Regardless do - .Please take prednisone 40 mg x1 day, then 30 mg x1 day, then 20 mg x1 day, then 10 mg x1 day, and then 5 mg x1 day and stop   Test covid at home - if positive cal for antiviral     No Known Allergies

## 2022-05-24 NOTE — Telephone Encounter (Signed)
Called and spoke with pt letting her know recs per MR. Pt said that she has done two covid tests and both have come back negative. Pt said she will do another one to make sure that one is also negative. Pt has an upcoming appt scheduled with the office. Told pt to keep that appt as scheduled and she verbalized understanding. Nothing further needed.

## 2022-05-24 NOTE — Telephone Encounter (Signed)
Spoke with pt who states she has increased cough and sore throat, along with chest tightness. Pt states this has been going to for days. Pt states chest is sore from coughing (pt denies chest pain).  Pt denies fever/ chills/ upset stomach. Pt was scheduled for OV on 05/26/22 with Dr. Chase Caller but would like something for the cough to be called in before that time. Dr. Chase Caller please advise. Pt advised if symptoms get worse, to go to UC or ED for evaluation.

## 2022-05-25 ENCOUNTER — Telehealth: Payer: Self-pay | Admitting: Internal Medicine

## 2022-05-25 MED ORDER — PREDNISONE 10 MG PO TABS: ORAL_TABLET | ORAL | 0 refills | Status: DC

## 2022-05-25 NOTE — Telephone Encounter (Signed)
Called pt's pharmacy to check on the Rx for prednisone that was sent 11/28 and they said they did not receive it. Have resubmitted the Rx to the pharmacy.called and spoke with pt letting her know that this had been resubmitted and she verbalized understanding. Nothing further needed.

## 2022-05-26 ENCOUNTER — Ambulatory Visit (INDEPENDENT_AMBULATORY_CARE_PROVIDER_SITE_OTHER): Payer: Medicare Other | Admitting: Internal Medicine

## 2022-05-26 VITALS — BP 120/68 | HR 71

## 2022-05-26 DIAGNOSIS — J209 Acute bronchitis, unspecified: Secondary | ICD-10-CM | POA: Diagnosis not present

## 2022-05-26 NOTE — Patient Instructions (Addendum)
ICD-10-CM   1. ILD (interstitial lung disease) ([place])  J84.9     2. 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 High risk medication use  Z79.899     4. [name] phenomenon without gangrene  I73.00     5. Scleroderma ([place])  M34.9     6. Centrilobular emphysema ([place])  J43.2     7. Chronic cough  R05.3     8. Vaccine counseling  Z71.85     9. Flu vaccine need  Z23        Interstitial pulmonary disease ([place]) Scl-70 antibody positive [name] phenomenon without gangrene High risk medication use Therapeutic drug monitoring Acute bronchitis and rhinitis 05/26/2022    -Very likely had RSV infection or some other respiratory viral infection.  Currently is somewhat on the  mend but it sounds like you need more rest and hydration and avoiding sick contacts and finishing up your doxycycline and prednisone  Plan -Monitor pulse ox levels at home -Complete recently prescribed doxycycline and prednisone burst.  -Hold Myfortic for the next 5 days -Otherwise  cotninue  prednisone to 5 mg per day +  myfortic and bactrim at current dose  - appreciate future interest in clinical trials - do spirometry and dlco in 6 months - connect with Butch Penny for support group - do spirometry and dlco in Jan 2024 - do ECHO in Jan 2024 - do HRCT in Jan 2024  Pulmonary emphysema, unspecified emphysema type ([place])  Plan -  Continue spiriva  Vaccine counsleing  - now there is new covid strain and natural immunity from dec 2022 has probably waned  Plan - RSV and covid mRNA on your own  next few weeks once better   Followup -Jan 2024  with Dr Chase Caller - 30 min visit but after spiro/dlco, and HRCT  - echo in JAn 2024 as well  - symptom score at followup  Call or return sooner if needed

## 2022-05-26 NOTE — Progress Notes (Signed)
PCP Tara C, PA-[name]   HPI  IOV 02/10/2016  Chief Complaint  Patient presents with   Pulmonary Consult    Pt referred by Dr. Melford [name] for chronic cough x 1 year. Pt states she feels she has a tickle that is causing her dry cough. Pt states she has DOE when climbing stairs. Pt deneis CP/tightness.     81 year old female with hemochromatosis homozygous gene followed by Dr. Beryle [name] (last phlebotomy many years ago and is on serial monitoring) with children and siblings with active disease. She has also atrial fibrillation followed by Dr. Caryl [name]. Reports insidious onset of chronic cough in the last 1 year. It is stable since onset. It fluctuates between mild and severe in severity. It is mostly dry in quality. Mostly present in the daytime but sometimes also wakes her up at night. It is definitely not progressive. It is episodic and present every day. Aggravated by talking sometimes and then the throat feels dry associated with tickle in the throat and also sensation the cough is coming from the upper chest retrosternally and sometimes relieved by drinking water or chewing on a lozenge. Also aggravated by seasonal changes particularly in the spring and the fall which makes her think she has allergies. There is sometimes associated gag. She also reports nonspecific occasional wheezing and associated shortness of breath that is nonspecific but present with exertion and relieved by rest No other clear cut aggravating or relieving factors.   cough associated history  - Medications: She is not on fish oil or ACE inhibitors - Sinus drainage: She does admit to spring allergies. She did have something removed from her hard palate several years ago by ENT does not know details - Acid reflux: She says she is significant acid reflux and is on Prilosec. Without which she'll have significant symptoms - Pulmonary disease: FenO 12 02/10/2016  and normal. She denies any personal history of asthma or  pulmonary fibrosis of COPD or emphysema smoked one pack per day started smoking at age 18 and quit in 1999. Making a 42 pack smoking history. Chest x-ray 08/26/2014 personally visualized is clear - Tobacco :  reports that she quit smoking about 18 years ago. She has a 42.00 pack-year smoking history. She has never used smokeless tobacco.       OV 02/18/2016  Chief Complaint  Patient presents with   Follow-up    Pt here after PFT and HRCT. Pt denies changes in SOB and cough. Pt denies any new complaints at this time.    Follow-up chronic cough. This visit is to follow-up on test results of function test and high resolution CT chest which are described below9 results show isolated reduction in diffusion capacity which can be explained by possible early ILD and also emphysema. There are also new findings of nodule 6 mm bilaterally on lower lobes. She prefers a very cautious approach to this evaluation.     Pulmonary function test 02/12/2016 - FVC 2.8 cm/101%, FEV1 1.9 L/91%. Ratio 60/90%. Total lung capacity 115%. DLCO reduced at 14.5/60%. She is isolated reduction in diffusion capacity    HRCT chest 02/16/16 IMPRESSION: 1. Mild patchy subpleural reticulation in both lungs with a basilar predominance. No significant traction bronchiectasis. No frank honeycombing. Findings could represent an interstitial lung disease such as nonspecific interstitial pneumonia (NSIP), with early usual interstitial pneumonia (UIP) not excluded. A follow-up high-resolution chest CT study in 12 months is recommended to assess temporal pattern stability. 2. Bilateral lower lobe  solid pulmonary nodules, largest 6 mm. Non-contrast chest CT at 3-6 months is recommended. If the nodules are stable at time of repeat CT, then future CT at 18-24 months (from today's scan) is considered optional for low-risk patients, but is recommended for high-risk patients. This recommendation follows the consensus statement:  Guidelines for Management of Incidental Pulmonary Nodules Detected on CT Images:From the Fleischner Society 2017; published online before print (10.1148/radiol.[id number]). 3. Additional findings include mild-to-moderate centrilobular emphysema, aortic atherosclerosis and 2 vessel coronary atherosclerosis.     Electronically Signed   By: [name] M.D.   On: 02/16/2016 14:14    OV 08/31/2016  Chief Complaint  Patient presents with   Follow-up    HRCT was never scheduled. Pt states that the cough is still present >> slightly improved since last OV. Pt states that she feels her allergies has gotten it ramped up again.    Follow-up multifactorial cough associated with autoimmune antibody positive, interstitial lung disease and emphysema previous history of smoking  After last visit in August 2017 she had high resolution CT chest in December 2017 that showed persistence of ILD. I personally visualized the CT chest. He [name] for follow-up. She tells me that since August 2017 she has insidious onset of shortness of breath and is progressively worse. It is mild and present only on inclines is not therefore tenderness. In terms of her cough is persistent. It is associated with significant sinus drainage that she thinks is allergy related. She constantly clears her throat. Lab review shows autoimmune antibody positive in August 2017 with scleroderma antibody slightly elevated at 6.4. I referred her to rheumatology but she does not remember this and she did not make this follow-up. She also tells me that she definitely has a long history of [name] phenomena in her fingers but no one has ever formally diagnosed with connective tissu  e disease.  OV 11/28/2016  Chief Complaint  Patient presents with   Follow-up    Pt here after PFT. Pt states her breathing is uchanged since last OV. Pt [name]/o dry cough.- pt states this has slightly improved since last OV. Pt denies CP/tightness and f/[name]/s.      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 Since then she has seen rheumatologis Dr. Keturah Barre. I reviewd the notes and also discussed with the patient wa a good understading of what s going on. She tells me that some of the lupus antibodies have been positive correlating with Raynaud. Patient is on beta blocker for atrial fbrillation and it is the preference by the rheumatologist Dr. D that she switc to calcium channel blocker. She has a follow-up appointment pending with her electrophysiologist Dr. Caryl [name].  She is scleroderma antibody positive but according to her rheumtolgist she does not have dermatologic  Manifestation of the disese. 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 She pulm  function test today and this is stable. In terms of arrest or symptoms she stable. The cough is actually improved. But she does get dyspneic walking up stairs especially a few flights. She does not wnt her to pulmonary habilitation because se exrcises on the treadmill although she does not monitor her saturations.   reports that she quit smoking about 19 years ago. She has a 42.00 pack-year smoking history. she has never used smokeless tobacco.   OV 06/12/2017  Chief Complaint  Patient presents with   Follow-up    Pt states that she has been doing good since last visit. States that she has a "tickle cough" that is sporadic and has SOB that she states is when she climbs 2 flights of stairs. Denies any CP.    Follow-up combined emphysema was interstitial lung disease [autoimmune undifferentiated connective tissue disease interstitial lung disease]. Autoimmune features from May 2018 rheumatology noted with Dr. Keturah Barre: Raynaud phenomenon positive without gangrene, positive anti-cardiolipin and positive IgM and SCL-70 positive without clinical features of  scleroderma    Last seen June 2018. Since then she's stable. Overall she tells me that only problem is a tickle in her throat and slight cough. This is up and down depending on the pollen exposure she gets. She gets dyspneic for climbing few to several flights of stairs. This is unchanged. She did have full function test today and that shows mild significant worsening though overall gradient is only mild.. However she's not feeling this. She has appointment with Dr. Keturah Barre  pending    OV 07/18/2017  Chief Complaint  Patient presents with   Follow-up    HRCT done 07/03/17.  Pt states she is the same as she was at last visit. SOB with exertion.    Follow-up combined emphysema was interstitial lung disease [autoimmune undifferentiated connective tissue disease interstitial lung disease]. Autoimmune features from May 2018 rheumatology noted with Dr. Keturah Barre: Raynaud phenomenon positive without gangrene, positive anti-cardiolipin and positive IgM and SCL-70 positive without clinical features of scleroderma    SWAY GUTTIERREZ presents for follow-up.  She is here to discuss the results of a high-resolution CT scan of the chest.  The scan was reviewed by Dr. Rosario Jacks thoracic radiology who feels that patient has probable UIP pattern that is definitely progressive compared to August 2017 although there is no comment about progression since June 2018 CT scan.  The pulmonary nodules itself are stable since August 2017 and are likely benign.  The change in the CT scan which is mild progression pulmonary fibrosis corresponds with the pulmonary function test that shows mild progression.  She is now here with her daughter Junie Panning and her husband.  She also states that 1 of her other daughters has rheumatoid arthritis and is on TNF alpha blockade.  All her 3 daughters have hemo-chromatosis    OV 08/29/2017  Chief Complaint  Patient presents with   Follow-up    Pt states she has been doing good since last visit. Pt  recently went to [place] and had some mild problems with SOB due to heat.  Pt was prescribed cellcept and bactrim but has not yet started meds due to questions with other meds she is taking.    81 year old female with ILD secondary to autoimmune disease clinically suspicious of scleroderma but also previous history of hemochromatosis with family history of hemochromatosis.  She is here with her husband.  This visit is only a discussion visit.  Because she has many questions about starting CellCept before she actually does.  We did hepatitis virus panel, QuantiFERON gold, G6PD and all this is normal.  Lab work is normal.  I checked with [facility] pharmacist about interactions and was cleared for her to start CellCept.  The only recommendation was for patient to come off Prilosec because of reduced effect of CellCept.  Patient questions revolved around CellCept: She does not want to come off PPI because of hiatal hernia and has had bad acid  reflux.  So I would not Willis Modena asking for any alternative PPI.  In addition she states she takes multiple multivitamins including Biotene, vitamin D3, vitamin D, vitamin T97 and folic acid.  She also takes Allegra occasionally.  She wants to make sure it is all okay with CellCept.  She has not had a flu shot today and she is asking if she should have it.  She has never had flu shot before.  She is also wondering about anticoagulation in the setting of CellCept and thyroid issues in the setting of Bactrim.  In terms of her genetics: Initially I corresponded with our local geneticist who thought she might be better served at [facility] but the [facility] genetics person wrote to me saying that patient should first be seen by [facility] rheumatology and hematology.  Patient's not so sure she wants to see the subspecialist.  She will speak to Dr. Beryle [name] her local hematologist to see if it is worthwhile seeing the hematology department at [facility].   Lungs/Pleura: Moderate centrilobular emphysema. Mild basilar predominant subpleural reticulation and ground-glass, increased from 02/16/2016. No traction bronchiectasis/ bronchiolectasis, architectural distortion or honeycombing. Side-by-side nodules in the medial left lower lobe measure up to 6 mm (series 3, image 95), unchanged from 02/16/2016 and considered benign. No air trapping. No pleural fluid. Airway is unremarkable.  IMPRESSION: 1. Mild progression in basilar predominant subpleural fibrosis from 02/16/2016, raising suspicion for usual interstitial pneumonitis. 2. Aortic atherosclerosis (ICD10-170.0). Moderate coronary artery calcification. 3.  Emphysema (ICD10-J43.9).     Electronically Signed   By: Lorin Picket M.D.   On: 07/03/2017 09:54    OV 09/19/2017  Chief Complaint  Patient presents with   Follow-up    Pt states she has been doing good. Currently on cellcept and bactrim and has been doing well on it once put back on omeprazole. Pt still has the cough and sometimes when trying to take a deep breath can't fully get all O2 out.   Emonee Winkowski returns for follow-up.  This is for autoimmune interstitial lung disease.  She was started on CellCept recently.  She is here to follow-up for therapeutic drug monitoring.  Because she was on omeprazole and we were initiating CellCept with Bactrim we asked her to hold off on omeprazole because the omeprazole impairs drug levels of CellCept.  However she tried it for a week and her acid reflux was significant despite ranitidine so she went back on omeprazole.  Pharmacist Willis Modena is advising changing the CellCept to equivaekbtdose of Myfortic. So far tolerating cellcept/pred/bactrimm well. Has seen dR G in heme and on observation. Had PFT today on cellcept/pred/bactrim and is better! Continue siwht spriva  OV 10/31/2017  . Chief Complaint  Patient presents with   Follow-up    Pt currently taking  myfortic and states she has been well on that. Pt states she has some mild nausea and has some problems with sleeping at night. SOB is stable,mild coughing. Denies any CP.    Follow-up progressive interstitial lung disease with SCL-70 antibody positive and Raynard Follow-up associated emphysema.  Last visit September 19, 2017.  At that time she had intolerance to the CellCept therefore we switched her to Myfortic.  She is here to report for follow-up about this.  She is tolerating Myfortic just well.  She had some transient insomnia for a few days last week but this resolved.  She only has mild intermittent occasional nausea with the Myfortic but otherwise is  tolerating it really well at the low-dose of 360 mg twice daily.  She continues on prednisone 10 mg daily and Bactrim 3 times a week.  She is due for blood work today.  There are no other new issues.  She continues on Spiriva for her emphysema   OV 02/01/2018  Chief Complaint  Patient presents with   Follow-up    PFT performed today.  Pt stated other than having shingles earlier, things have been doing good for her. Pt states SOB is stable and states she still has an occ cough.    Follow-up progressive interstitial lung disease with SCL-70 antibody positive and Raynud - IPAF Follow-up associated emphysema   ANNE BOLTZ presents for routine follow-up with interstitial lung disease clinic. She has the above problems. In the interim she tells me she had developed shingles on the right neck but this is resolved. This is despite having zoster vaccine in 2012. She was yet to have the bnnew  shingles vaccine.in any event currently she is feeling well. She only has occasional post residual neuropathic pain. In terms of her shortness of breath it is stable and minimal. Walking desaturation test shows stability. Pulmicort function test below show stability. She is tolerating the mycophenolate myfortic well at 360 mg twice daily and prednisone 10 mg  daily and Bactrim for PCP prophylaxis. Overall she feels stable and feels that it is best medicines on touch in terms of dosing change  OV 08/14/2018  Subjective:  Patient ID: [name], female , DOB: [birth year] , age 14 y.o. , MRN: [id number] , ADDRESS: [place] [id number]   08/14/2018 -   Chief Complaint  Patient presents with   Follow-up    PFT performed today.  Pt states she is slowly getting better after her recent pna. States she still has some issues with SOB and an occ cough. Pt also states that she feels like she has a weight on her chest at times.    Follow-up progressive interstitial lung disease with SCL-70 antibody positive and Raynud - IPAF - on myfortic since march 2019 with pred 10 and bactrim  Follow-up associated emphysema - on spiriva    HPIfortic HEDI BARKAN 81 y.o. -returns for 49-monthfollow-up of her interstitial lung disease associated with emphysema.  She presents with her middle daughter EJunie Panningwho is met me before.  She continues on Myfortic and prednisone 10 mg and Bactrim prophylaxis.  She continues on Spiriva.  She had an episode of atrial fibrillation according to review of the chart and my on history with in the middle of January 2020.  At this time she had a chest x-ray that showed asymptomatic upper lobe pneumonia.  I personally visualized the chest x-ray and confirmed the findings.  Antibiotic was not prescribed.  Then I follow this up with a CT scan of the chest [we canceled her elective colonoscopy].  CT chest showed improved pneumonia findings.  I prescribed even though she was feeling well cephalexin for a week.  She says she took it.  She continues to feel well except she has baseline amount of symptoms that is documented below.  Of note the high-resolution CT chest showed an improvement in ILD compared to a year ago.  In fact her pulmonary function tests are also showing stability/improvement as documented below.  She is very  happy about this outcome with immunosuppression .  However, she is a little bit unsure why she is symptomatic in  terms of shortness of breath and cough.  She is very concerned that her fibrosis is getting worse.  She wants to have closer follow-up than every 6 months.   IMPRESSION: HRCT Jan 2020 1. Vaguely bandlike patchy consolidation and ground-glass opacity at the periphery of the right upper and superior segment right lower lobes, new since 07/03/2017 chest CT, improving compared to 07/10/2018 chest radiograph, favoring resolving pneumonia with evolving postinfectious scarring. 2. Previously described basilar predominant subpleural reticulation and ground-glass attenuation is minimal and appears decreased in the interval. No bronchiectasis or honeycombing. Findings may represent an interstitial lung disease such as nonspecific interstitial pneumonia (NSIP) or less likely early usual interstitial pneumonia (UIP). Findings are indeterminate for UIP per consensus guidelines: Diagnosis of Idiopathic Pulmonary Fibrosis: An Official ATS/ERS/JRS/ALAT Clinical Practice Guideline. [place], Iss 5, [phone], Feb 25 2017. 3. Moderate centrilobular emphysema with mild diffuse bronchial wall thickening, suggesting COPD. 4. Two-vessel coronary atherosclerosis. 5. Small hiatal hernia.   Aortic Atherosclerosis (ICD10-I70.0) and Emphysema (ICD10-J43.9). ROS - per HPI    OV 04/22/2019  Subjective:  Patient ID: [name], female , DOB: 1941/05/14 , age 31 y.o. , MRN: [id number] , ADDRESS: [name] [id number]   Follow-up slowly progressive interstitial lung disease with SCL-70 antibody positive and Raynud - IPAF - on myfortic since march 2019 with pred 5 and bactrim 3x/week  Follow-up associated emphysema - on spiriva   04/22/2019 -   Chief Complaint  Patient presents with   Follow-up    Patient reports that her breathing is doing well  at this time.      HPI [name] 81 y.o. -presents for follow-up.  Last seen by myself in early part of 2020 before the pandemic.  At that time CT chest suggested possible progression.  She saw a nurse practitioner in the interim after the onset of the pandemic.  She was anxious about the progression of her ILD so she asked for a CT scan in less than 1 year.  A CT scan of the chest was done in September 2020.  This is deemed stable since early part of the year but with possible progression versus stability since January 2019.  In terms of her symptoms she continues to be stable with very minimal shortness of breath and cough.  Her pulmonary function test below also shows stability.  Her walking desaturation test also shows stability.  She is tolerating her Myfortic, prednisone and 3 times a week of Bactrim just fine.  There are no side effects.  Last blood work was in August 2020 and this was reviewed.  We discussed the possibility of switching to antifibrotic nintedanib instead of taking immunosuppressants.  The rational for this would be it is a safer drug with less long-term side effect such as lack of immunosuppression.  However she feels her current regimen is working well for her.  She also feels that she would benefit more from a immunosuppressive drug because of her autoimmune antibodies and therefore she is more inclined to continue with her current immunosuppressive regimen of prednisone 5 mg/day and Myfortic and Bactrim 3 times a week.        OV 07/04/2019  Subjective:  Patient ID: [name], female , DOB: 04-22-1941 , age 26 y.o. , MRN: [id number] , ADDRESS: [place] [id number]   Follow-up mildy progressive - stable interstitial lung disease with SCL-70 antibody positive and Raynud -  IPAF - on myfortic since march 2019 with pred 5 and bactrim 3x/week  Follow-up associated emphysema  Follow-up Raynard  Has associated hemochromatosis  Has  associated atrial fibrillation on Xarelto   07/04/2019 -   Chief Complaint  Patient presents with   Follow-up    Pt states she has been doing well since last visit and denies any complaints.     HPI [name] 81 y.o. -last visit was in October 2020.  At that time she was doing well.  Given her continued immunosuppression with prednisone and Myfortic we discussed the possibility of switching the Myfortic to the nintedanib but understanding that there uncertainties whether this is superior approach and if she could end up with temporary side effects of the nintedanib.  Based on that she decided to continue with prednisone, Myfortic and Bactrim as she was doing because things are working well.  However she later called in and wanted this change to nintedanib.  We did this change to nintedanib but then her co-pay ended up being over 500+ dollars and then she started also getting worried about the side effects with nintedanib particularly GI.  She is also on Xarelto for atrial fibrillation.  Therefore she made this visit ahead of schedule to discuss the possibility of making the switch.  I understand from her that it will be 500+ dollars every month with nintedanib as a co-pay.  In addition she is worried about the superiority of taking nintedanib.  She fully understands that Myfortic over length of time can carry cancer risk and immunosuppression risk but at this point in time she feels well and she is tolerating it well.  She also understands that nintedanib can carry temporary GI side effects.  She also understands that it may not be a superior approach.  She understands it could be an equivalent approach and definitely lower immunosuppression.  Her main systemic immunosuppressive features are lung disease and Raynaud.  She is worried this might flareup.  In terms of her symptoms there is stable as documented below.  Her walking desaturation test is also stable.   Last pulmonary function test  was in August r 2020 Last CT scan of the chest was in September 2020 Last blood work October 2020 Last echocardiogram 2012.  ROS - per HPI  OV 10/22/2019  Subjective:  Patient ID: [name], female , DOB: [birth year] , age 70 y.o. , MRN: [id number] , ADDRESS: [place] [id number]   Follow-up mildy progressive - stable interstitial lung disease with SCL-70 antibody positive and Raynud - IPAF - on myfortic since march 2019 with pred 5 and bactrim 3x/week  Follow-up associated emphysema  Follow-up Raynaud  Has associated hemochromatosis  Has associated atrial fibrillation on Xarelto    10/22/2019 -   Chief Complaint  Patient presents with   Follow-up    PFT performed today. Pt states she has been doing good since last visit. Denies any complaints with breathing.     HPI [name] 81 y.o. -returns for routine follow-up.  Overall doing well.  She feels shortness of breath is stable.  She feels she is tolerating Myfortic and prednisone fine no new interim issues she has had a Covid vaccine.  She continues on high risk immunosuppressive therapy.  There is some mild diarrhea with the Myfortic but it is tolerable.  The shortness of breath scale is stable walking desaturation test is stable.  Her pulmonary function test shows stable FVC with  a possible reduction in diffusion capacity.  I do not know what to make of this at this point in time.  It could be a technical issue because she is feeling stable.  She did have echocardiogram that showed grade 2 diastolic dysfunction.   She continues on her Spiriva for associated emphysema.  ROS - per HPI     OV 03/03/2020   Subjective:  Patient ID: [name], female , DOB: 27-Jun-1941, age 48 y.o. years. 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 -returns for follow-up.  Overall she is doing well.  However in the last 1-2 months she says she is slightly more short of breath than usual.  She is also having slight increase in dry cough.  Based on her subjective symptom questionnaires it seems the cough itself is stable but she is definitely saying it is worse.  She is seeing the dyspnea is overall stable compared to the cough but looking at the symptom questionnaire is maybe it is a little worse.  Is hard to discern.  It has been approximately 1 year since she had a high-resolution CT chest.  She is wondering if she can have this repeated.  She is on immunosuppression.  She is also noticing -that at home her pulse oximetry on her finger varies a lot [she does have [name]]) and sometimes can be as low as 88% - 93%.  She does acknowledge that the circulation in her hands is not as good.  Pulse ox here at rest was normal.  She had pulmonary function test that shows stability.-With the FVC although I am not sure what to make of the DLCO.  She has had a Covid booster vaccine  Last echocardiogram January 2021 with mild diastolic dysfunction    IMPRESSION: 1. Very mild basilar predominant subpleural reticulation and ground-glass, similar to minimally progressive from 07/03/2017. Nonspecific interstitial pneumonitis is favored. Findings are indeterminate for UIP per consensus guidelines: Diagnosis of Idiopathic Pulmonary Fibrosis: An Official ATS/ERS/JRS/ALAT Clinical Practice Guideline. [place], Iss 5, [phone], Feb 25 2017. 2. Aortic atherosclerosis (ICD10-170.0). Coronary artery calcification. 3. Enlarged pulmonic trunk, indicative of pulmonary arterial hypertension. 4.  Emphysema (ICD10-J43.9).     Electronically Signed   By: Lorin Picket M.D.   On: 03/20/2019 14:47  Echocardiogram -January 2021: Shows grade 2 diastolic dysfunction but otherwise unremarkable.   ROS - per HPI  HPI [name] 81 y.o. -last visit was in September 2021.  She continues to do well overall.  Shortness of breath symptom question is stable.  Walking desaturation test is stable.  She is compliant with her medications.  In September 2021 there is concern that her ILD was getting worse because of change in PFT.  So we did a CT scan of the chest radiologist reported this to be stable since 2017.  Most recently she  had blood work in December 2021 chemistry panel is normal and I reviewed it.  CBC panel is also normal with stable mild anemia hemoglobin 12.4 g%.  She is up-to-date with her COVID vaccine including booster.  She had pulmonary function test today that showed some fluctuation.  Her main concern is that she is coughing more  In terms of her cough it is dry and has a barking quality to it.  Or laryngeal quality to it.  It is mostly at day and night but never wakes her up at night.  When she has it at night it is only when she wakes up.  Talking makes it worse.  Laughing makes it worse.  Drinking water makes it better.  She thinks it is progressive.  Symptom score showed the cough is getting worse and deep.  She has no fever or chills.  No desaturations.  We discussed the possibility of opportunistic infection but she rightfully questant if it could be opportunistic infection if everything else is stable.  There is no wheezing.  She is wondering if it could be because of allergies.  She has seasonal allergies.  Review of the records indicate this has not been  investigated.  She is willing to have an investigation for this.  We discussed the possibility of cough neuropathy.  She recollects has had gabapentin before when she had shingles.  She did not have any side effects from it.     CT Chest data sept 2021  TECHNIQUE: Multidetector CT imaging of the chest was performed following the standard protocol without intravenous contrast. High resolution imaging of the lungs, as well as inspiratory and expiratory imaging, was performed.   COMPARISON:  09/09/2019, 03/20/2019, 07/03/2017, 12/06/2016, 06/15/2016   FINDINGS: Cardiovascular: Aortic atherosclerosis. Normal heart size. Three-vessel coronary artery calcifications. No pericardial effusion.   Mediastinum/Nodes: No enlarged mediastinal, hilar, or axillary lymph nodes. Thyroid gland, trachea, and esophagus demonstrate no significant findings.   Lungs/Pleura: Moderate centrilobular emphysema. No significant interval change in subtle irregular peripheral interstitial opacity predominantly seen in the bilateral lung bases, although also noted in nondependent portions of the right middle lobe and lingula. Mild lobular air trapping on expiratory phase imaging. Stable, definitively benign and faintly rim calcified 6 mm nodule of the dependent left lower lobe (series 5, image 86). No pleural effusion or pneumothorax.   Upper Abdomen: No acute abnormality.   Musculoskeletal: No chest wall mass or suspicious bone lesions identified.   IMPRESSION: 1. No significant interval change in subtle irregular peripheral interstitial opacity predominantly seen in the bilateral lung bases, although also noted in nondependent portions of the right middle lobe and lingula. There is no appreciable change in these findings on multiple prior examinations dating back to 2017. Bland post infectious or inflammatory scarring is favored, although minimal fibrotic interstitial lung disease is not excluded and  if characterized by ATS pulmonary fibrosis criteria, findings are consistent with an early "indeterminate for UIP" pattern. Consider ongoing follow-up CT to assess for stability of fibrotic findings. Findings are indeterminate for UIP per consensus guidelines: Diagnosis of Idiopathic Pulmonary Fibrosis: An Official ATS/ERS/JRS/ALAT Clinical Practice Guideline. [place], Iss 5, [phone], Feb 25 2017. 2. Emphysema (ICD10-J43.9). 3. Coronary artery disease.  Aortic Atherosclerosis (ICD10-I70.0).     Electronically Signed   By: Eddie Candle M.D.   On: 03/16/2020 14:12  OV 02/23/2021  Subjective:  Patient ID: [name], female , DOB: December 17, 1940 , age 5 y.o. ,  MRN: [id number] , ADDRESS: [place] [address] PCP Kathyrn Lass, MD Patient Care Team: Kathyrn Lass, MD as PCP - General (Family Medicine) Deboraha Sprang, MD as PCP - Cardiology (Cardiology) Annia Belt, MD as Consulting Physician (Hematology) Deboraha Sprang, MD as Consulting Physician (Cardiology) Richmond Campbell, MD as Consulting Physician (Gastroenterology)  This Provider for this visit: Treatment Team:  Attending Provider: Brand Males, MD    02/23/2021 -   Chief Complaint  Patient presents with   Follow-up    Pt states she has been doing okay since last visit and denies any complaints.    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  Follow-up associated emphysema  Follow-up Raynaud  Has associated hemochromatosis  Has associated atrial fibrillation on Xarelto  Mild associated diastolic dysfunction and echo January 2021  HPI [name] 81 y.o. -returns for her 58-monthfollow-up.  Last visit with me was in February 2022.  Is   She continues on Myfortic prednisone and Bactrim.  We  held off on nintedanib given stability.  She did have spirometry in February 2022.?  There is a slight decline but she feels stable.  Symptom scores are stable.  Walking desaturation test is below and is stable versus a three-point drop that is?  Significant..  Last CT scan September 2021.  But clinically she feels stable.  We held off on nintedanib last visit because of stability.       OV 07/23/2021  Subjective:  Patient ID: PAnastasia [name] female , DOB: [birth year], age 81y.o. , MRN: [id number], [place]: [address] [id number] MKathyrn Lass MD Patient Care Team: MKathyrn Lass MD as PCP - General (Family Medicine) KDeboraha Sprang MD as PCP - Cardiology (Cardiology) GAnnia Belt MD as Consulting Physician (Hematology) KDeboraha Sprang MD as Consulting Physician (Cardiology) MRichmond Campbell MD as Consulting Physician (Gastroenterology)  This Provider for this visit: Treatment Team:  Attending Provider: RBrand Males MD    07/23/2021 -   Chief Complaint  Patient presents with   Follow-up    Pt states she has been doing okay and denies any complaints.    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  Follow-up associated emphysema  Follow-up Raynaud  Has associated hemochromatosis  Has associated atrial fibrillation on Xarelto  Mild associated diastolic dysfunction and echo January 2021  HPI Tara ESTEY89y.o. -returns for 624-monthollow-up.  Last seen in August 2022.  She feels since then dyspnea slightly worse.  This is reflected in the symptom score below.  In December 2022 he did have COVID and she recovered from it.  She does not think the decline is because of the COVID.  She continues on her prednisone Bactrim and Myfortic.  Her last CT scan of the chest was in  September 2021.  Last echocardiogram was in January 2021.  Last pulmonary function test was almost 1 year ago in February 2022.  She is wondering about repeat investigations.  She is concerned about worsening ILD.    Of note she just turned [age] old.  OV 09/09/2021  Subjective:  Patient ID: PaElmon [name]  Delynn Flavin, female , DOB: July 06, 1940 , age 4 y.o. , MRN: [id number] , ADDRESS: [place] [address] PCP Kathyrn Lass, MD Patient Care Team: Kathyrn Lass, MD as PCP - General (Family Medicine) Deboraha Sprang, MD as PCP - Cardiology (Cardiology) Annia Belt, MD as Consulting Physician (Hematology) Deboraha Sprang, MD as Consulting Physician (Cardiology) Richmond Campbell, MD as Consulting Physician (Gastroenterology)  This Provider for this visit: Treatment Team:  Attending Provider: Brand Males, MD    09/09/2021 -   Chief Complaint  Patient presents with   Follow-up    PFT performed today. Pt states she has been doing okay and denies any complaints.    HPI MAKINZE JANI 81 y.o. -returns for follow-up.  At this point in time she is feeling stable.  She had work-up to see if her ILD is worse or she has developed pulmonary hypertension.  CT scan shows stability since 2021.  Her pulmonary function test shows a 3% decline in DLCO.  On an average she has 3% decline per year over the last 4 years.  At this point in time she is feeling well.  No side effects from Myfortic.  She asked about options if she were to get worse.  We discussed about nintedanib.  Also indicated there is an injection [tocilizumab] but at this point in time she is fine with the choices.  She does understand Myfortic is immunosuppressant and there is also cancer risk.  She is decided to continue.  She also has mild emphysema for which she is on Spiriva and it is stable.  I gave her a book called Thursdays with Cornelia Copa written by the patient who has pulmonary fibrosis and scleroderma.  She  is going to read it.  We did discuss briefly clinical trials as a care option and she is interested  -No clearing of the throat.  -She has seen rheumatology recently.  She continues to have [name].  They have a nitroglycerin.  She does not want to try it.  We discussed about using heart warmers.  She is going to buy this   CT Chest data  XR [name] NO REPORT  Result Date: 09/07/2021 Please see Notes tab for imaging impression.      OV 04/21/2022  Subjective:  Patient ID: [name], female , DOB: 09-09-1940 , age 33 y.o. , MRN: [id number] , ADDRESS: [place] [address] PCP Kathyrn Lass, MD Patient Care Team: Kathyrn Lass, MD as PCP - General (Family Medicine) Deboraha Sprang, MD as PCP - Cardiology (Cardiology) Annia Belt, MD as Consulting Physician (Hematology) Deboraha Sprang, MD as Consulting Physician (Cardiology) Richmond Campbell, MD as Consulting Physician (Gastroenterology)  This Provider for this visit: Treatment Team:  Attending Provider: Brand Males, MD  04/21/2022 -   Chief Complaint  Patient presents with   Follow-up    6 Follow-up PT states no changes, DOE (upstairs, fast paced walking)     HPI LAMA NARAYANAN 81 y.o. -returns for follow-up.  She tells me that since last seeing me since the summer or so she is slightly more short of breath especially when she climbs stairs but overall symptom score is stable.  In September 2023 she went on vacation to [facility] and came back with her girlfriends and she had a great time but she did notice more shortness of breath than usual.  She attributed this to heavier exertion but nevertheless with stairs she is  finding herself more short of breath.  She is compliant with Spiriva for associated emphysema Myfortic Bactrim and daily prednisone.  2 days ago she saw Hazel Sams rheumatology PA for her scleroderma and has been deemed stable  Pulmonary function test shows  slight reduction in FVC over time but still within normal limits but the DLCO is dramatically low.  There is no anemia correction on the DLCO.  When I did a rough anemia correction it appears that DLCO might be okay.  Of asked for a formal correction on the DLCO for anemia.  Given her anemia which appears to be relatively more pronounced compared to 11/15/2021 greater than 1.5 g% drop she has been referred by rheumatology to hematology because of history of hemochromatosis.  Her last echo and CT scan of the chest were earlier this year  We discussed about the 3 different respiratory vaccines.  Strongly recommended COVID mRNA because she is immunocompromised.  She did have outpatient COVID December 2022 but it was a different strain.  Also recommend high-dose flu shot which she will get today and we discussed RSV vaccine.     PFT  OV 05/26/2022  Subjective:  Patient ID: [name], female , DOB: [birth year] , age 49 y.o. , MRN: [id number] , ADDRESS: [place] [address] PCP Kathyrn Lass, MD Patient Care Team: Kathyrn Lass, MD as PCP - General (Family Medicine) Deboraha Sprang, MD as PCP - Cardiology (Cardiology) Deboraha Sprang, MD as Consulting Physician (Cardiology) Richmond Campbell, MD as Consulting Physician (Gastroenterology) Orson Slick, MD as Consulting Physician (Hematology and Oncology)  This Provider for this visit: Treatment Team:  Attending Provider: Brand Males, MD    05/26/2022 -   Chief Complaint  Patient presents with   Acute Visit    Pt started having complaints of dry cough, wheezing, and chest discomfort which began 1 week ago. Has done multiple covid tests which all have come back negative. States she is now coughing up phlegm and also has complaints of a headache and has no energy.     Follow-up mildy sllowy progressive - stable interstitial lung disease with SCL-70 antibody positive, atnticardiolipnd abd + and Raynud -  IPAF - on myfortic since march 2019 with pred 5 and bactrim 3x/week  -Last CT feb 2023 without progerssion.  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  Has associated hemochromatosis  Has associated atrial fibrillation on Xarelto  Mild associated diastolic dysfunction in pst but not on echo  Feb 2023  HPI BENNYE NIX 81 y.o. -she is making an acute visit.  She presents with her daughter Junie Panning who I have met before.  She was on a [place] cruise out of [place] between May 14, 2022 and Saturday after Thanksgiving May 20, 2022.  On Friday after Thanksgiving she suddenly took ill.  Symptoms got worse on Saturday when she returned.  She describes laryngitis ear pain cough headaches sore throat runny nose brain fog increased fatigue and sleeping.  COVID test x 2 was negative.  She called our office and we gave her antibiotic and prednisone because the sputum is now yellow.  She is taking this but she wanted an acute visit today.  Some symptoms are better.  She is saying that her headache is better sore throat is better congestion is better and the tiredness is better but according to the  daughter she still sleeping a lot.  Her cough nature has changed and she is got some yellow sputum now.  So some symptoms are not better.  Walking desaturation test is stable but she only walked 2 laps and she stopped because of fatigue.  The cough is particularly worse at night.  She does not want any cough medication     SYMPTOM SCALE - ILD 08/14/2018  04/22/2019  07/04/2019  10/22/2019  03/03/2020 139# 08/18/2020  02/23/2021  07/23/2021 Covid dec 2022 04/21/2022  05/26/2022   O2 use *RA           Shortness of Breath 0 -> 5 scale with 5 being worst (score 6 If unable to do)           At rest 1 0 0 0.5 1 0 0.5 1 0   Simple tasks - showers, clothes change, eating, shaving 1.5 0 .5 0._0 0.5  1.5 0   Household (dishes, doing bed, laundry) 1.5 1 0.5 0._1 0.5 1.5 1   Shopping 1.5 0 0/5 0._2 0   Walking keeping up with others of same age 83 0 0.5 0.5 1.5 1._3 0   Walking up Stairs 2.5 1 0.5 0/5 1.5 1.5 1._4 Total (40 - 48) Dyspnea Score 10- 2 2._5 How bad is your cough? 1.5 1.5 0.5 1 1.5 3 1._6 How bad is your fatigue 1._7 1.5 2 1._8 nausea    0 0 0.5 0 0 0   vomit    0 0 0 0 0 0   dairrhea    2.5 0 0.5 1._9 anxiety    0 0 0 0 0 0   deoresoj    0 0 0 0 0 0     Simple office walk 185 feet x  3 laps goal with forehead probe 07/04/2019  10/22/2019  03/03/2020 Finger probe 02/23/2021  04/21/2022  05/26/2022 Acute visit  O2 used ra ra ra  ra ra ra  Number laps completed _10 Stopped at 2   Comments about pace avg  avg  Mod seteady pace avg  Resting Pulse Ox/HR 98% and 74/min 100% and 66 96% and 80 99% and 69 99% and HR 65 100% and HR 71  Final Pulse Ox/HR 98% and 87/min 97% and 81 95% and 83 96% and 78 96% and HR 82 97% and HR 86  Desaturated </= 88% no no no  no   Desaturated <= 3% points no no no yes Yes 3 pnts   Got Tachycardic >/= 90/min no no no  no   Symptoms at end of test Mild dyspnea Very mild dyspnea no no none Fatigued ad mild dyspnea  Miscellaneous comments x    stable Stopped at 2 of goal 3   CT Chest data  No results found.   PFT     Latest Ref Rng & Units 04/12/2022   10:27 AM 09/09/2021   10:46 AM 08/18/2020    2:49 PM 02/28/2020    4:16 PM 10/22/2019   11:28 AM 02/18/2019    2:10 PM 08/14/2018    1:28 PM  PFT Results  FVC-Pre L 2.48  2.67  2.78  2.90  2.83  2.82  2.92   FVC-Predicted Pre % 96  103  105  108  106  104  108   Pre FEV1/FVC % % 72  68  68  66  69  67  67   FEV1-Pre L 1.80  1.81  1.88  1.92  1.96  1.89  1.95   FEV1-Predicted Pre % 93  94  96  96  98  93  96   DLCO uncorrected ml/min/mmHg 9.26  11.33  11.74  11.88  12.46  15.31  13.19   DLCO UNC% % 49  60  62  63  66  81  70   DLCO  corrected ml/min/mmHg 9.26  12.46  11.74  11.88  12.46     DLCO COR %Predicted % 49  66  62  63  66     DLVA Predicted % 57  70  65  66  73  79  68        has a past medical history of Atrial fibrillation ([place]), Chest pain, Depression with anxiety, GERD (gastroesophageal reflux disease), Hemochromatosis, History of syncope, and Hypothyroidism.   reports that she quit smoking about 24 years ago. Her smoking use included cigarettes. She has a 42.00 pack-year smoking history. She has never been exposed to tobacco smoke. She has never used smokeless tobacco.  Past Surgical History:  Procedure Laterality Date   MOUTH SURGERY     SHOULDER ARTHROSCOPY Left    TOTAL ABDOMINAL HYSTERECTOMY     VARICOSE VEIN SURGERY      No Known Allergies  Immunization History  Administered Date(s) Administered   Fluad Quad(high Dose 65+) 02/18/2019, 04/21/2022   Influenza Split 03/27/2019   Influenza, High Dose Seasonal PF 08/29/2017, 04/26/2018, 04/29/2020, 05/12/2021   PFIZER(Purple Top)SARS-COV-2 Vaccination 07/22/2019, 08/12/2019, 03/11/2020   Pneumococcal Conjugate-13 06/12/2014   Pneumococcal Polysaccharide-23 06/27/2010, 04/25/2018   Tdap 03/16/2011, 07/05/2019   Zoster Recombinat (Shingrix) 04/08/2019   Zoster, Live 06/27/2010, 04/08/2019    Family History  Problem Relation Age of Onset   Stroke Mother    Heart disease Father 23   Hemochromatosis Brother    Breast cancer Neg Hx      Current Outpatient Medications:    acetaminophen (TYLENOL) 325 MG tablet, Take 650 mg by mouth every 6 (six) hours as needed for mild pain or headache., Disp: , Rfl:    apixaban (ELIQUIS) 5 MG TABS tablet, TAKE 1 TABLET(5 MG) BY MOUTH TWICE DAILY, Disp: 180 tablet, Rfl: 2   atorvastatin (LIPITOR) 10 MG tablet, Take 10 mg by mouth daily., Disp: , Rfl:    Cyanocobalamin (VITAMIN B 12 PO), Take 1,000 mcg by mouth daily., Disp: , Rfl:    doxycycline (VIBRA-TABS) 100 MG tablet, Take 1 tablet (100 mg total) by  mouth 2 (two) times daily., Disp: 10 tablet, Rfl: 0   ferrous sulfate 325 (65 FE) MG EC tablet, Take 1 tablet (325 mg total) by mouth daily. For best absorption take on empty stomach with fruit juice (ex: orange juice)., Disp: 30 tablet, Rfl: 3   flecainide (TAMBOCOR) 100 MG tablet, Take 1 tablet (100 mg total) by mouth 2 (two) times daily., Disp: 180 tablet, Rfl: 3   folic acid (FOLVITE) 1 MG tablet, Take 1 mg by mouth daily., Disp: , Rfl:    levothyroxine (SYNTHROID, LEVOTHROID) 112 MCG tablet, Take 112 mcg by mouth daily., Disp: , Rfl:    methocarbamol (ROBAXIN) 500 MG tablet, Take 1 tablet (500 mg total) by mouth daily as needed for muscle spasms., Disp: 30 tablet, Rfl: 2  metoprolol succinate (TOPROL-XL) 25 MG 24 hr tablet, TAKE 1 TABLET BY MOUTH  DAILY, Disp: 90 tablet, Rfl: 2   mycophenolate (MYFORTIC) 360 MG TBEC EC tablet, TAKE 1 TABLET BY MOUTH TWICE DAILY, Disp: 180 tablet, Rfl: 3   nitroGLYCERIN (NITROGLYN) 2 % ointment, Apply 1/4 inch to fingertips three times daily as needed., Disp: 30 g, Rfl: 1   ondansetron (ZOFRAN ODT) 4 MG disintegrating tablet, Take 1-2 tablets three times daily as needed, Disp: 42 tablet, Rfl: 1   pantoprazole (PROTONIX) 20 MG tablet, Take 20 mg by mouth 2 (two) times daily., Disp: , Rfl:    predniSONE (DELTASONE) 10 MG tablet, Take 4 tablets (40 mg total) by mouth daily with breakfast for 1 day, THEN 3 tablets (30 mg total) daily with breakfast for 1 day, THEN 2 tablets (20 mg total) daily with breakfast for 1 day, THEN 1 tablet (10 mg total) daily with breakfast for 1 day, THEN 0.5 tablets (5 mg total) daily with breakfast for 1 day., Disp: 10.5 tablet, Rfl: 0   predniSONE (DELTASONE) 5 MG tablet, TAKE 1 TABLET(5 MG) BY MOUTH DAILY WITH BREAKFAST, Disp: 90 tablet, Rfl: 0   SPIRIVA RESPIMAT 2.5 MCG/ACT AERS, INHALE 2 PUFFS INTO THE LUNGS DAILY, Disp: 4 g, Rfl: 10   sulfamethoxazole-trimethoprim (BACTRIM DS) 800-160 MG tablet, TAKE 1 TABLET BY MOUTH ON MONDAY,  WEDNESDAY, AND FRIDAY, Disp: 90 tablet, Rfl: 3   Vitamin D, Cholecalciferol, 25 MCG (1000 UT) TABS, Take 1 tablet by mouth daily., Disp: , Rfl:       Objective:   Vitals:   05/26/22 0942  BP: 120/68  Pulse: 71  SpO2: 100%    Estimated body mass index is 24.65 kg/m as calculated from the following:   Height as of 04/21/22: 5' 3.5" (1.613 m).   Weight as of 05/09/22: 141 lb 6.4 oz (64.1 kg).  _0 @  There were no vitals filed for this visit.   Physical Exam    General: No distress. Looks a bit puled down Neuro: Alert and Oriented x 3. GCS 15. Speech normal Psych: Pleasant Resp:  Barrel Chest - no.  Wheeze - no, Crackles - mild base, No overt respiratory distress CVS: Normal heart sounds. Murmurs - no Ext: Stigmata of Connective Tissue Disease - nono HEENT: Normal upper airway. PEERL +. No post nasal drip        Assessment:       ICD-10-CM   1. Acute bronchitis, unspecified organism  J20.9          Plan:     Patient Instructions     ICD-10-CM   1. ILD (interstitial lung disease) ([place])  J84.9     2. 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 High risk medication use  Z79.899     4. [name] phenomenon without gangrene  I73.00     5. Scleroderma ([place])  M34.9     6. Centrilobular emphysema ([place])  J43.2     7. Chronic cough  R05.3     8. Vaccine counseling  Z71.85     9. Flu vaccine need  Z23        Interstitial pulmonary disease ([place]) Scl-70 antibody positive [name] phenomenon without gangrene High risk medication use Therapeutic drug monitoring Acute bronchitis and rhinitis 05/26/2022    -Very likely had RSV infection or some other respiratory viral infection.  Currently is somewhat on the  mend but it sounds like you need more rest and hydration and  avoiding sick contacts and finishing up your doxycycline and prednisone  Plan -Monitor pulse ox levels at home -Complete recently prescribed doxycycline and prednisone burst.   -Hold Myfortic for the next 5 days -Otherwise  cotninue  prednisone to 5 mg per day +  myfortic and bactrim at current dose  - appreciate future interest in clinical trials - do spirometry and dlco in 6 months - connect with Butch Penny for support group - do spirometry and dlco in Jan 2024 - do ECHO in Jan 2024 - do HRCT in Jan 2024  Pulmonary emphysema, unspecified emphysema type ([place])  Plan -  Continue spiriva  Vaccine counsleing  - now there is new covid strain and natural immunity from dec 2022 has probably waned  Plan - RSV and covid mRNA on your own  next few weeks once better   Followup -Jan 2024  with Dr Chase Caller - 30 min visit but after spiro/dlco, and HRCT  - echo in JAn 2024 as well  - symptom score at followup  Call or return sooner if needed    SIGNATURE    Dr. Brand Males, M.D., [name],  Pulmonary and Critical Care Medicine Staff Physician, [place] Director - Interstitial Lung Disease  Program  [facility] [place] [facility] [place], [place], [address]  Pager: [phone], If no answer or between  15:00h - 7:00h: call [phone] Telephone: [phone]  10:23 AM 05/26/2022

## 2022-05-31 ENCOUNTER — Telehealth: Payer: Self-pay

## 2022-05-31 NOTE — Telephone Encounter (Signed)
Returned patient's call regarding questions related to upcoming lab appointments and prescriptions.  Patient stated that she had not been able to pick up her vitamin D prescription and wanted to confirm that her lab appointment would still be scheduled appropriately with this delay.  Upon further discussion, patient has been taking appropriate vitamin D supplementation, but has not started iron prescription. Confirmed with patient that this medication had been prescribed and already sent in to her preferred pharmacy. Patient confirmed that she would pick up prescription and begin taking.   Per Dr. Lorenso Courier - patient is to have lab rescheduled for 4 weeks after patient begins taking iron supplements. Okay to have patient keep 1/10 appointments. 12/13 and 12/26 lab appointments canceled.  LVM on patient's cell regarding appointment changes.

## 2022-06-08 ENCOUNTER — Telehealth: Payer: Self-pay | Admitting: Internal Medicine

## 2022-06-08 ENCOUNTER — Other Ambulatory Visit: Payer: Medicare Other

## 2022-06-08 MED ORDER — PREDNISONE 10 MG PO TABS: ORAL_TABLET | ORAL | 0 refills | Status: DC

## 2022-06-08 NOTE — Telephone Encounter (Signed)
Called and spoke with patient. She verbalized understanding. Prednisone has been sent to pharmacy. She will call us back early next week to let us know how she is feeling.   Nothing further needed at time of call.

## 2022-06-08 NOTE — Telephone Encounter (Signed)
Pt was diag w/RSV. Started to feel better but now it seems to be coming back or is something new she has. Please adv and consider another round of antibx and pred. Call @ [phone] (Cell)

## 2022-06-08 NOTE — Telephone Encounter (Signed)
Called and spoke with patient. She stated that she was seen by MR about 2 weeks and was diagnosed with RSV. She was prescribed Doxy and a prednisone taper. Once she completed these, she felt much better. Over the past 3 days, her symptoms have returned. She has some chest congestion but is unable to cough up any phlegm. Her chest also feels tight and she has been wheezing more. She denied any fever or body aches. She also has a runny nose with clear discharge. Denied any recent sick exposures.   She wanted to know if another round of abx and prednisone could be prescribed for her.   Pharmacy is [facility] on [address].   Judson Roch, can you please advise since MR is not available today? Thanks!

## 2022-06-09 NOTE — Progress Notes (Deleted)
Office Visit Note  Patient: [name]             Date of Birth: 1941-03-31           MRN: [id number]             PCP: Kathyrn Lass, MD Referring: Kathyrn Lass, MD Visit Date: 06/22/2022 Occupation: '@GUAROCC'$ @  Subjective:  No chief complaint on file.   History of Present Illness: [name] is a 81 y.o. female ***   Activities of Daily Living:  Patient reports morning stiffness for *** {minute/hour:19697}.   Patient {ACTIONS;DENIES/REPORTS:21021675::"Denies"} nocturnal pain.  Difficulty dressing/grooming: {ACTIONS;DENIES/REPORTS:21021675::"Denies"} Difficulty climbing stairs: {ACTIONS;DENIES/REPORTS:21021675::"Denies"} Difficulty getting out of chair: {ACTIONS;DENIES/REPORTS:21021675::"Denies"} Difficulty using hands for taps, buttons, cutlery, and/or writing: {ACTIONS;DENIES/REPORTS:21021675::"Denies"}  No Rheumatology ROS completed.   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 11/10/2016   History of hypothyroidism 11/10/2016   History of hemochromatosis 11/10/2016   Pulmonary emphysema ([place]) 08/31/2016   [name] phenomenon without gangrene 08/31/2016   Sinusitis, chronic 08/31/2016   Chronic cough 02/10/2016   Irritable larynx 02/10/2016   Stopped smoking with greater than 40 pack year history 02/10/2016   Chest pain, unspecified 10/06/2010   Shortness of breath 10/06/2010   Hypothyroidism    Hereditary hemochromatosis ([place]) 04/22/2010   ATRIAL FIBRILLATION 10/20/2008   PAC 10/20/2008   SYNCOPE AND COLLAPSE 10/20/2008    Past Medical History:  Diagnosis Date   Atrial fibrillation ([place])    a. Flecainide therapy;  b. event  monitor 4/12   Chest pain    a. GXT myoview 4/12: no isch., EF 86%;   b. 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 Beryle Beams   History of syncope    Hypothyroidism     Family History  Problem Relation Age of Onset   Stroke Mother    Heart disease Father 5   Hemochromatosis Brother    Breast cancer Neg Hx    Past Surgical History:  Procedure Laterality Date   MOUTH SURGERY     SHOULDER ARTHROSCOPY Left    TOTAL ABDOMINAL HYSTERECTOMY     VARICOSE VEIN SURGERY     Social History   Social History Narrative   REGULAR EXERCISE   Immunization History  Administered Date(s) Administered   Fluad Quad(high Dose 65+) 02/18/2019, 04/21/2022   Influenza Split 03/27/2019   Influenza, High Dose Seasonal PF 08/29/2017, 04/26/2018, 04/29/2020, 05/12/2021   PFIZER(Purple Top)SARS-COV-2 Vaccination 07/22/2019, 08/12/2019, 03/11/2020   Pneumococcal Conjugate-13 06/12/2014   Pneumococcal Polysaccharide-23 06/27/2010, 04/25/2018   Tdap 03/16/2011, 07/05/2019   Zoster Recombinat (Shingrix) 04/08/2019   Zoster, Live 06/27/2010, 04/08/2019     Objective: Vital Signs: There were no vitals taken for this visit.   Physical Exam   Musculoskeletal Exam: ***  CDAI Exam: CDAI Score: -- Patient Global: --; Provider Global: -- Swollen: --; Tender: -- Joint Exam 06/22/2022   No joint exam has been documented for this visit   There is currently no information documented on the homunculus. Go to the Rheumatology activity and  complete the homunculus joint exam.  Investigation: No additional findings.  Imaging: No results found.  Recent Labs: Lab Results  Component Value Date   WBC 6.7 05/09/2022   HGB 9.9 (L) 05/09/2022   PLT 340 05/09/2022   NA 139 05/09/2022   K 3.9 05/09/2022   CL 103 05/09/2022   CO2 30 05/09/2022   GLUCOSE 120 (H)  05/09/2022   BUN 14 05/09/2022   CREATININE 0.84 05/09/2022   BILITOT 0.4 05/09/2022   ALKPHOS 56 05/09/2022   AST 12 (L) 05/09/2022   ALT 10 05/09/2022   PROT 7.0 05/09/2022   ALBUMIN 4.5 05/09/2022   CALCIUM 9.3 05/09/2022   GFRAA 101 06/09/2020   QFTBGOLDPLUS NEGATIVE 08/10/2017    Speciality Comments: No specialty comments available.  Procedures:  No procedures performed Allergies: Patient has no known allergies.   Assessment / Plan:     Visit Diagnoses: No diagnosis found.  Orders: No orders of the defined types were placed in this encounter.  No orders of the defined types were placed in this encounter.   Face-to-face time spent with patient was *** minutes. Greater than 50% of time was spent in counseling and coordination of care.  Follow-Up Instructions: No follow-ups on file.   Earnestine Mealing, CMA  Note - This record has been created using Editor, commissioning.  Chart creation errors have been sought, but may not always  have been located. Such creation errors do not reflect on  the standard of medical care.

## 2022-06-20 ENCOUNTER — Other Ambulatory Visit: Payer: Self-pay

## 2022-06-20 ENCOUNTER — Emergency Department (HOSPITAL_COMMUNITY): Payer: Medicare Other

## 2022-06-20 ENCOUNTER — Inpatient Hospital Stay (HOSPITAL_COMMUNITY)
Admission: EM | Admit: 2022-06-20 | Discharge: 2022-07-01 | DRG: 516 | Disposition: A | Discharge status: Home / Self Care | Payer: Medicare Other | Attending: Family Medicine | Admitting: Family Medicine

## 2022-06-20 DIAGNOSIS — Z8639 Personal history of other endocrine, nutritional and metabolic disease: Secondary | ICD-10-CM

## 2022-06-20 DIAGNOSIS — M549 Dorsalgia, unspecified: Secondary | ICD-10-CM | POA: Diagnosis not present

## 2022-06-20 DIAGNOSIS — R21 Rash and other nonspecific skin eruption: Secondary | ICD-10-CM | POA: Diagnosis present

## 2022-06-20 DIAGNOSIS — F32A Depression, unspecified: Secondary | ICD-10-CM | POA: Diagnosis present

## 2022-06-20 DIAGNOSIS — E440 Moderate protein-calorie malnutrition: Secondary | ICD-10-CM | POA: Insufficient documentation

## 2022-06-20 DIAGNOSIS — K219 Gastro-esophageal reflux disease without esophagitis: Secondary | ICD-10-CM | POA: Diagnosis present

## 2022-06-20 DIAGNOSIS — Z8249 Family history of ischemic heart disease and other diseases of the circulatory system: Secondary | ICD-10-CM

## 2022-06-20 DIAGNOSIS — J439 Emphysema, unspecified: Secondary | ICD-10-CM | POA: Diagnosis present

## 2022-06-20 DIAGNOSIS — Z6825 Body mass index (BMI) 25.0-25.9, adult: Secondary | ICD-10-CM

## 2022-06-20 DIAGNOSIS — M8088XA Other osteoporosis with current pathological fracture, vertebra(e), initial encounter for fracture: Secondary | ICD-10-CM | POA: Diagnosis not present

## 2022-06-20 DIAGNOSIS — J8410 Pulmonary fibrosis, unspecified: Secondary | ICD-10-CM | POA: Diagnosis present

## 2022-06-20 DIAGNOSIS — Z7901 Long term (current) use of anticoagulants: Secondary | ICD-10-CM

## 2022-06-20 DIAGNOSIS — R768 Other specified abnormal immunological findings in serum: Secondary | ICD-10-CM | POA: Diagnosis present

## 2022-06-20 DIAGNOSIS — Z87891 Personal history of nicotine dependence: Secondary | ICD-10-CM

## 2022-06-20 DIAGNOSIS — Z7969 Long term (current) use of other immunomodulators and immunosuppressants: Secondary | ICD-10-CM

## 2022-06-20 DIAGNOSIS — D899 Disorder involving the immune mechanism, unspecified: Secondary | ICD-10-CM | POA: Diagnosis present

## 2022-06-20 DIAGNOSIS — S32030G Wedge compression fracture of third lumbar vertebra, subsequent encounter for fracture with delayed healing: Secondary | ICD-10-CM

## 2022-06-20 DIAGNOSIS — E876 Hypokalemia: Secondary | ICD-10-CM | POA: Diagnosis present

## 2022-06-20 DIAGNOSIS — Z79890 Hormone replacement therapy (postmenopausal): Secondary | ICD-10-CM

## 2022-06-20 DIAGNOSIS — K5900 Constipation, unspecified: Secondary | ICD-10-CM | POA: Diagnosis present

## 2022-06-20 DIAGNOSIS — E785 Hyperlipidemia, unspecified: Secondary | ICD-10-CM | POA: Diagnosis present

## 2022-06-20 DIAGNOSIS — I482 Chronic atrial fibrillation, unspecified: Secondary | ICD-10-CM | POA: Diagnosis present

## 2022-06-20 DIAGNOSIS — Z823 Family history of stroke: Secondary | ICD-10-CM

## 2022-06-20 DIAGNOSIS — E039 Hypothyroidism, unspecified: Secondary | ICD-10-CM | POA: Diagnosis present

## 2022-06-20 DIAGNOSIS — Z79899 Other long term (current) drug therapy: Secondary | ICD-10-CM

## 2022-06-20 DIAGNOSIS — S32020A Wedge compression fracture of second lumbar vertebra, initial encounter for closed fracture: Principal | ICD-10-CM

## 2022-06-20 DIAGNOSIS — I4891 Unspecified atrial fibrillation: Secondary | ICD-10-CM | POA: Diagnosis present

## 2022-06-20 DIAGNOSIS — F419 Anxiety disorder, unspecified: Secondary | ICD-10-CM | POA: Diagnosis present

## 2022-06-20 HISTORY — DX: Age-related osteoporosis without current pathological fracture: M81.0

## 2022-06-20 LAB — URINALYSIS, ROUTINE W REFLEX MICROSCOPIC
Bilirubin Urine: NEGATIVE
Glucose, UA: NEGATIVE mg/dL
Hgb urine dipstick: NEGATIVE
Ketones, ur: NEGATIVE mg/dL
Nitrite: NEGATIVE
Protein, ur: NEGATIVE mg/dL
Specific Gravity, Urine: 1.01 (ref 1.005–1.030)
pH: 6.5 (ref 5.0–8.0)

## 2022-06-20 LAB — URINALYSIS, MICROSCOPIC (REFLEX)

## 2022-06-20 LAB — CBC WITH DIFFERENTIAL/PLATELET
Abs Immature Granulocytes: 0.05 10*3/uL (ref 0.00–0.07)
Basophils Absolute: 0 10*3/uL (ref 0.0–0.1)
Basophils Relative: 0 %
Eosinophils Absolute: 0.2 10*3/uL (ref 0.0–0.5)
Eosinophils Relative: 2 %
HCT: 35.6 % — ABNORMAL LOW (ref 36.0–46.0)
Hemoglobin: 11 g/dL — ABNORMAL LOW (ref 12.0–15.0)
Immature Granulocytes: 1 %
Lymphocytes Relative: 30 %
Lymphs Abs: 2 10*3/uL (ref 0.7–4.0)
MCH: 30.7 pg (ref 26.0–34.0)
MCHC: 30.9 g/dL (ref 30.0–36.0)
MCV: 99.4 fL (ref 80.0–100.0)
Monocytes Absolute: 0.7 10*3/uL (ref 0.1–1.0)
Monocytes Relative: 10 %
Neutro Abs: 3.8 10*3/uL (ref 1.7–7.7)
Neutrophils Relative %: 57 %
Platelets: 264 10*3/uL (ref 150–400)
RBC: 3.58 MIL/uL — ABNORMAL LOW (ref 3.87–5.11)
RDW: 20.5 % — ABNORMAL HIGH (ref 11.5–15.5)
WBC: 6.7 10*3/uL (ref 4.0–10.5)
nRBC: 0 % (ref 0.0–0.2)

## 2022-06-20 LAB — COMPREHENSIVE METABOLIC PANEL
ALT: 10 U/L (ref 0–44)
AST: 13 U/L — ABNORMAL LOW (ref 15–41)
Albumin: 3.7 g/dL (ref 3.5–5.0)
Alkaline Phosphatase: 58 U/L (ref 38–126)
Anion gap: 9 (ref 5–15)
BUN: 10 mg/dL (ref 8–23)
CO2: 30 mmol/L (ref 22–32)
Calcium: 9.7 mg/dL (ref 8.9–10.3)
Chloride: 100 mmol/L (ref 98–111)
Creatinine, Ser: 0.72 mg/dL (ref 0.44–1.00)
GFR, Estimated: >60 mL/min (ref >60)
Glucose, Bld: 87 mg/dL (ref 70–99)
Potassium: 3.6 mmol/L (ref 3.5–5.1)
Sodium: 139 mmol/L (ref 135–145)
Total Bilirubin: 0.4 mg/dL (ref 0.3–1.2)
Total Protein: 6.4 g/dL — ABNORMAL LOW (ref 6.5–8.1)

## 2022-06-20 LAB — TROPONIN I (HIGH SENSITIVITY)
Troponin I (High Sensitivity): 4 ng/L (ref <18)
Troponin I (High Sensitivity): 6 ng/L (ref <18)

## 2022-06-20 MED ORDER — ALUM & MAG HYDROXIDE-SIMETH 200-200-20 MG/5ML PO SUSP
30.0000 mL | ORAL | Status: DC | PRN
  Administered 2022-06-20: 30 mL via ORAL
  Filled 2022-06-20: qty 30

## 2022-06-20 MED ORDER — TIOTROPIUM BROMIDE MONOHYDRATE 2.5 MCG/ACT IN AERS: 2.0000 | INHALATION_SPRAY | Freq: Every day | RESPIRATORY_TRACT | Status: DC

## 2022-06-20 MED ORDER — ACETAMINOPHEN 500 MG PO TABS
1000.0000 mg | ORAL_TABLET | Freq: Once | ORAL | Status: AC
  Administered 2022-06-20: 1000 mg via ORAL
  Filled 2022-06-20: qty 2

## 2022-06-20 MED ORDER — ACETAMINOPHEN 650 MG RE SUPP: 650.0000 mg | Freq: Four times a day (QID) | RECTAL | Status: DC | PRN

## 2022-06-20 MED ORDER — OXYCODONE HCL 5 MG PO TABS
5.0000 mg | ORAL_TABLET | ORAL | Status: DC | PRN
  Administered 2022-06-20 – 2022-06-30 (×24): 5 mg via ORAL
  Filled 2022-06-20 (×26): qty 1

## 2022-06-20 MED ORDER — AMOXICILLIN-POT CLAVULANATE 875-125 MG PO TABS
1.0000 | ORAL_TABLET | Freq: Two times a day (BID) | ORAL | Status: AC
  Administered 2022-06-20 – 2022-06-21 (×2): 1 via ORAL
  Filled 2022-06-20 (×2): qty 1

## 2022-06-20 MED ORDER — VITAMIN D 25 MCG (1000 UNIT) PO TABS
1000.0000 [IU] | ORAL_TABLET | Freq: Every day | ORAL | Status: DC
  Administered 2022-06-21 – 2022-07-01 (×11): 1000 [IU] via ORAL
  Filled 2022-06-20 (×11): qty 1

## 2022-06-20 MED ORDER — FLECAINIDE ACETATE 100 MG PO TABS
100.0000 mg | ORAL_TABLET | Freq: Two times a day (BID) | ORAL | Status: DC
  Administered 2022-06-20 – 2022-07-01 (×22): 100 mg via ORAL
  Filled 2022-06-20 (×23): qty 1

## 2022-06-20 MED ORDER — OXYCODONE HCL 5 MG PO TABS
10.0000 mg | ORAL_TABLET | Freq: Once | ORAL | Status: AC
  Administered 2022-06-20: 10 mg via ORAL
  Filled 2022-06-20: qty 2

## 2022-06-20 MED ORDER — ONDANSETRON HCL 4 MG/2ML IJ SOLN
4.0000 mg | Freq: Four times a day (QID) | INTRAMUSCULAR | Status: DC | PRN
  Administered 2022-06-25: 4 mg via INTRAVENOUS
  Filled 2022-06-20: qty 2

## 2022-06-20 MED ORDER — SULFAMETHOXAZOLE-TRIMETHOPRIM 800-160 MG PO TABS
1.0000 | ORAL_TABLET | ORAL | Status: DC
  Administered 2022-06-22 – 2022-06-27 (×3): 1 via ORAL
  Filled 2022-06-20 (×5): qty 1

## 2022-06-20 MED ORDER — PREDNISONE 5 MG PO TABS: 5.0000 mg | ORAL_TABLET | Freq: Every day | ORAL | Status: DC

## 2022-06-20 MED ORDER — LEVOTHYROXINE SODIUM 112 MCG PO TABS
112.0000 ug | ORAL_TABLET | Freq: Every day | ORAL | Status: DC
  Administered 2022-06-21 – 2022-07-01 (×11): 112 ug via ORAL
  Filled 2022-06-20 (×11): qty 1

## 2022-06-20 MED ORDER — LIDOCAINE 5 % EX PTCH
1.0000 | MEDICATED_PATCH | CUTANEOUS | Status: DC
  Administered 2022-06-20 – 2022-06-22 (×3): 1 via TRANSDERMAL
  Filled 2022-06-20 (×3): qty 1

## 2022-06-20 MED ORDER — OXYCODONE HCL 5 MG PO TABS: 5.0000 mg | ORAL_TABLET | Freq: Four times a day (QID) | ORAL | Status: DC | PRN

## 2022-06-20 MED ORDER — METOPROLOL SUCCINATE ER 25 MG PO TB24
25.0000 mg | ORAL_TABLET | Freq: Every day | ORAL | Status: DC
  Administered 2022-06-20 – 2022-07-01 (×12): 25 mg via ORAL
  Filled 2022-06-20 (×12): qty 1

## 2022-06-20 MED ORDER — IOHEXOL 350 MG/ML SOLN
100.0000 mL | Freq: Once | INTRAVENOUS | Status: AC | PRN
  Administered 2022-06-20: 100 mL via INTRAVENOUS

## 2022-06-20 MED ORDER — ONDANSETRON HCL 4 MG/2ML IJ SOLN
4.0000 mg | Freq: Once | INTRAMUSCULAR | Status: AC
  Administered 2022-06-20: 4 mg via INTRAVENOUS
  Filled 2022-06-20: qty 2

## 2022-06-20 MED ORDER — UMECLIDINIUM BROMIDE 62.5 MCG/ACT IN AEPB
1.0000 | INHALATION_SPRAY | Freq: Every day | RESPIRATORY_TRACT | Status: DC
  Administered 2022-06-21 – 2022-07-01 (×10): 1 via RESPIRATORY_TRACT
  Filled 2022-06-20 (×3): qty 7

## 2022-06-20 MED ORDER — MUSCLE RUB 10-15 % EX CREA
TOPICAL_CREAM | CUTANEOUS | Status: AC | PRN
  Filled 2022-06-20: qty 85

## 2022-06-20 MED ORDER — ONDANSETRON HCL 4 MG PO TABS: 4.0000 mg | ORAL_TABLET | Freq: Four times a day (QID) | ORAL | Status: DC | PRN

## 2022-06-20 MED ORDER — POLYMYXIN B-TRIMETHOPRIM 10000-0.1 UNIT/ML-% OP SOLN
1.0000 [drp] | Freq: Three times a day (TID) | OPHTHALMIC | Status: DC
  Administered 2022-06-20 – 2022-06-22 (×6): 1 [drp] via OPHTHALMIC
  Filled 2022-06-20: qty 10

## 2022-06-20 MED ORDER — ACETAMINOPHEN 325 MG PO TABS
650.0000 mg | ORAL_TABLET | Freq: Four times a day (QID) | ORAL | Status: DC | PRN
  Administered 2022-06-20: 650 mg via ORAL
  Filled 2022-06-20: qty 2

## 2022-06-20 MED ORDER — METHOCARBAMOL 500 MG PO TABS
500.0000 mg | ORAL_TABLET | Freq: Four times a day (QID) | ORAL | Status: DC | PRN
  Administered 2022-06-20 – 2022-06-22 (×5): 500 mg via ORAL
  Filled 2022-06-20 (×6): qty 1

## 2022-06-20 MED ORDER — PANTOPRAZOLE SODIUM 20 MG PO TBEC
20.0000 mg | DELAYED_RELEASE_TABLET | Freq: Two times a day (BID) | ORAL | Status: DC
  Administered 2022-06-21 – 2022-07-01 (×22): 20 mg via ORAL
  Filled 2022-06-20 (×23): qty 1

## 2022-06-20 MED ORDER — SODIUM CHLORIDE (PF) 0.9 % IJ SOLN
INTRAMUSCULAR | Status: AC
  Filled 2022-06-20: qty 50

## 2022-06-20 MED ORDER — FOLIC ACID 1 MG PO TABS
1.0000 mg | ORAL_TABLET | Freq: Every day | ORAL | Status: DC
  Administered 2022-06-20 – 2022-07-01 (×12): 1 mg via ORAL
  Filled 2022-06-20 (×12): qty 1

## 2022-06-20 MED ORDER — FENTANYL CITRATE PF 50 MCG/ML IJ SOSY
25.0000 ug | PREFILLED_SYRINGE | Freq: Once | INTRAMUSCULAR | Status: AC
  Administered 2022-06-20: 25 ug via INTRAVENOUS
  Filled 2022-06-20: qty 1

## 2022-06-20 MED ORDER — APIXABAN 5 MG PO TABS
5.0000 mg | ORAL_TABLET | Freq: Two times a day (BID) | ORAL | Status: DC
  Administered 2022-06-20 – 2022-06-23 (×6): 5 mg via ORAL
  Filled 2022-06-20 (×7): qty 1

## 2022-06-20 MED ORDER — ALUM & MAG HYDROXIDE-SIMETH 200-200-20 MG/5ML PO SUSP
30.0000 mL | ORAL | Status: DC | PRN
  Administered 2022-06-21 – 2022-06-24 (×3): 30 mL via ORAL
  Filled 2022-06-20 (×3): qty 30

## 2022-06-20 MED ORDER — FERROUS SULFATE 325 (65 FE) MG PO TABS
325.0000 mg | ORAL_TABLET | Freq: Every day | ORAL | Status: DC
  Administered 2022-06-21 – 2022-06-26 (×6): 325 mg via ORAL
  Filled 2022-06-20 (×6): qty 1

## 2022-06-20 MED ORDER — MYCOPHENOLATE SODIUM 180 MG PO TBEC
360.0000 mg | DELAYED_RELEASE_TABLET | Freq: Two times a day (BID) | ORAL | Status: DC
  Administered 2022-06-20 – 2022-07-01 (×22): 360 mg via ORAL
  Filled 2022-06-20 (×23): qty 2

## 2022-06-20 MED ORDER — SODIUM CHLORIDE 0.9 % IV BOLUS
500.0000 mL | Freq: Once | INTRAVENOUS | Status: AC
  Administered 2022-06-20: 500 mL via INTRAVENOUS

## 2022-06-20 MED ORDER — ATORVASTATIN CALCIUM 10 MG PO TABS
10.0000 mg | ORAL_TABLET | Freq: Every day | ORAL | Status: DC
  Administered 2022-06-20 – 2022-07-01 (×12): 10 mg via ORAL
  Filled 2022-06-20 (×12): qty 1

## 2022-06-20 MED ORDER — LIDOCAINE VISCOUS HCL 2 % MT SOLN
15.0000 mL | OROMUCOSAL | Status: DC | PRN
  Administered 2022-06-21: 15 mL via ORAL
  Filled 2022-06-20 (×3): qty 15

## 2022-06-20 MED ORDER — HYDROMORPHONE HCL 1 MG/ML IJ SOLN
0.5000 mg | Freq: Once | INTRAMUSCULAR | Status: AC
  Administered 2022-06-20: 0.5 mg via INTRAVENOUS
  Filled 2022-06-20: qty 1

## 2022-06-20 NOTE — Progress Notes (Signed)
Orthopedic Tech Progress Note Patient Details:  [name] [birth year] [id number]  Ortho Devices Type of Ortho Device: Thoracolumbar corset (TLSO) Ortho Device/Splint Interventions: Ordered, Application, Adjustment, Removal   Post Interventions Patient Tolerated: Well Instructions Provided: Care of device, Adjustment of device Brace fitted and left at bedside, unknown if patient is being admitted of discharged yet.  Vernona Rieger 06/20/2022, 2:05 PM

## 2022-06-20 NOTE — ED Provider Notes (Signed)
[facility] DEPT Provider Note   CSN: [id number] Arrival date & time: 06/20/22  1601     History  Chief Complaint  Patient presents with   Back Pain   Abdominal Pain    [name] is a 81 y.o. female.   Back Pain Associated symptoms: abdominal pain   Abdominal Pain   81 year old female presents emergency department with complaints of right-sided flank pain with radiation to right groin.  Patient reports history of RSV infection approximately 2 to 3 weeks ago.  She has persistence of cough since onset.  Was seen at urgent care approximately 6 to 7 days ago because she felt like she "drained her back from coughing."  She was prescribed oxycodone as well as muscle laxer of which helped her symptoms up until the last 1 to 2 days.  Reports worsening of right-sided back pain and pain began to radiate into right inguinal region. Reports associated feelings of nausea without emesis.  States that the pain is worsened with any movement denies fever, chest pain, shortness of breath, dysuria, hematuria, urinary retention/frequency, vaginal symptoms, change in bowel habits.  Denies saddle anesthesia, bowel/bladder dysfunction, weakness/sensory deficits lower extremities, fever, history of IV drug use, no malignancy, known trauma/injury.  States that since radiation of pain began, symptoms not been controlled by oral oxycodone or muscle relaxer.    Past medical history significant for pulmonary emphysema, interstitial lung disease, degenerative disc disease of lumbar spine, atrial fibrillation on Eliquis, GERD, hypothyroidism, hereditary hemochromatosis, pulmonary nodules  Home Medications Prior to Admission medications   Medication Sig Start Date End Date Taking? Authorizing Provider  acetaminophen (TYLENOL) 325 MG tablet Take 650 mg by mouth every 6 (six) hours as needed for mild pain or headache.    [provider]  apixaban (ELIQUIS) 5 MG TABS  tablet TAKE 1 TABLET(5 MG) BY MOUTH TWICE DAILY 12/24/21   Deboraha Sprang, MD  atorvastatin (LIPITOR) 10 MG tablet Take 10 mg by mouth daily. 06/18/19   [provider]  Cyanocobalamin (VITAMIN B 12 PO) Take 1,000 mcg by mouth daily.    [provider]  doxycycline (VIBRA-TABS) 100 MG tablet Take 1 tablet (100 mg total) by mouth 2 (two) times daily. 05/24/22   Brand Males, MD  ferrous sulfate 325 (65 FE) MG EC tablet Take 1 tablet (325 mg total) by mouth daily. For best absorption take on empty stomach with fruit juice (ex: orange juice). 05/12/22   Orson Slick, MD  flecainide (TAMBOCOR) 100 MG tablet Take 1 tablet (100 mg total) by mouth 2 (two) times daily. 12/24/21   Deboraha Sprang, MD  folic acid (FOLVITE) 1 MG tablet Take 1 mg by mouth daily.    [provider]  levothyroxine (SYNTHROID, LEVOTHROID) 112 MCG tablet Take 112 mcg by mouth daily.    [provider]  methocarbamol (ROBAXIN) 500 MG tablet Take 1 tablet (500 mg total) by mouth daily as needed for muscle spasms. 04/18/22   Ofilia Neas, PA-C  metoprolol succinate (TOPROL-XL) 25 MG 24 hr tablet TAKE 1 TABLET BY MOUTH  DAILY 05/29/17   Deboraha Sprang, MD  mycophenolate (MYFORTIC) 360 MG TBEC EC tablet TAKE 1 TABLET BY MOUTH TWICE DAILY 06/29/21   Brand Males, MD  nitroGLYCERIN (NITROGLYN) 2 % ointment Apply 1/4 inch to fingertips three times daily as needed. 08/13/21   Ofilia Neas, PA-C  ondansetron (ZOFRAN ODT) 4 MG disintegrating tablet Take 1-2 tablets three times  daily as needed 09/11/19   Brand Males, MD  pantoprazole (PROTONIX) 20 MG tablet Take 20 mg by mouth 2 (two) times daily. 11/10/21   [provider]  predniSONE (DELTASONE) 10 MG tablet Take 4 tabs for 2 days, then 3 tabs for 2 days, 2 tabs for 2 days, then 1 tab for 2 days, then stop. 06/08/22   Magdalen Spatz, NP  SPIRIVA RESPIMAT 2.5 MCG/ACT AERS INHALE 2 PUFFS INTO THE LUNGS DAILY 01/27/22   Brand Males, MD  sulfamethoxazole-trimethoprim (BACTRIM DS) 800-160 MG tablet TAKE 1 TABLET BY MOUTH ON MONDAY, WEDNESDAY, AND FRIDAY 12/29/21   Brand Males, MD  Vitamin D, Cholecalciferol, 25 MCG (1000 UT) TABS Take 1 tablet by mouth daily.    [provider]      Allergies    Patient has no known allergies.    Review of Systems   Review of Systems  Gastrointestinal:  Positive for abdominal pain.  Musculoskeletal:  Positive for back pain.  All other systems reviewed and are negative.   Physical Exam Updated Vital Signs BP 112/68   Pulse 71   Temp 97.8 F (36.6 C) (Oral)   Resp 18   Wt 62.6 kg   SpO2 97%   BMI 24.06 kg/m  Physical Exam Vitals and nursing note reviewed.  Constitutional:      General: She is not in acute distress.    Appearance: She is well-developed.  HENT:     Head: Normocephalic and atraumatic.  Eyes:     Conjunctiva/sclera: Conjunctivae normal.  Cardiovascular:     Rate and Rhythm: Normal rate and regular rhythm.     Heart sounds: No murmur heard. Pulmonary:     Effort: Pulmonary effort is normal. No respiratory distress.     Breath sounds: Normal breath sounds.  Abdominal:     Palpations: Abdomen is soft.     Tenderness: There is abdominal tenderness.     Comments: Patient with right-sided CVA tenderness with mild right lower quadrant tenderness.  No right upper quadrant tenderness.  No obvious overlying skin abnormalities appreciated.    Musculoskeletal:        General: No swelling.     Cervical back: Neck supple.     Comments: No obvious tenderness to palpation of right hip. No midline tenderness of cervical, thoracic, lumbar spine with no obvious step-off or deformity.  Patient has symmetric strength in bilateral lower extremities.  No sensory deficits along major nerve distributions of lower extremities.  DTR symmetric and equal bilateral lower extremities.  Pedal pulses full and intact bilaterally.  Skin:    General: Skin is warm and  dry.     Capillary Refill: Capillary refill takes less than 2 seconds.  Neurological:     Mental Status: She is alert.  Psychiatric:        Mood and Affect: Mood normal.     ED Results / Procedures / Treatments   Labs (all labs ordered are listed, but only abnormal results are displayed) Labs Reviewed  COMPREHENSIVE METABOLIC PANEL - Abnormal; Notable for the following components:      Result Value   Total Protein 6.4 (*)    AST 13 (*)    All other components within normal limits  CBC WITH DIFFERENTIAL/PLATELET - Abnormal; Notable for the following components:   RBC 3.58 (*)    Hemoglobin 11.0 (*)    HCT 35.6 (*)    RDW 20.5 (*)    All other components within normal  limits  URINALYSIS, ROUTINE W REFLEX MICROSCOPIC - Abnormal; Notable for the following components:   Leukocytes,Ua SMALL (*)    All other components within normal limits  URINALYSIS, MICROSCOPIC (REFLEX) - Abnormal; Notable for the following components:   Bacteria, UA RARE (*)    All other components within normal limits  URINE CULTURE  TROPONIN I (HIGH SENSITIVITY)  TROPONIN I (HIGH SENSITIVITY)    EKG None  Radiology CT Angio Chest/Abd/Pel for Dissection W and/or Wo Contrast  Result Date: 06/20/2022 CLINICAL DATA:  Right lower back pain for 6 days. Pain extending to the abdomen this morning. EXAM: CT ANGIOGRAPHY CHEST, ABDOMEN AND PELVIS TECHNIQUE: Non-contrast CT of the chest was initially obtained. Multidetector CT imaging through the chest, abdomen and pelvis was performed using the standard protocol during bolus administration of intravenous contrast. Multiplanar reconstructed images and MIPs were obtained and reviewed to evaluate the vascular anatomy. RADIATION DOSE REDUCTION: This exam was performed according to the departmental dose-optimization program which includes automated exposure control, adjustment of the mA and/or kV according to patient size and/or use of iterative reconstruction technique.  CONTRAST:  182m OMNIPAQUE IOHEXOL 350 MG/ML SOLN COMPARISON:  Unenhanced CT of the abdomen and pelvis and CT of the lumbar spine performed earlier today. FINDINGS: CTA CHEST FINDINGS Cardiovascular: Heart top-normal in size. No pericardial effusion. Mild three-vessel coronary artery calcifications. Great vessels are normal in caliber. Thoracic aorta mild atherosclerosis. No dissection. Arch branch vessels are widely patent. Mediastinum/Nodes: No neck base, mediastinal or hilar masses or enlarged lymph nodes. Trachea and esophagus are unremarkable. Lungs/Pleura: Mild subpleural reticulation mostly in the dependent lower lobes. Advanced centrilobular emphysema. No lung mass or nodule. No consolidation to suggest pneumonia and no pulmonary edema. No pleural effusion or pneumothorax. Musculoskeletal: No fracture or acute finding. No bone lesion. No chest wall mass. Review of the MIP images confirms the above findings. CTA ABDOMEN AND PELVIS FINDINGS VASCULAR Aorta: Aorta normal in caliber. No dissection. Partly calcified atherosclerotic plaque, relatively mild. No penetrating ulcer. Celiac: Patent without evidence of aneurysm, dissection, vasculitis or significant stenosis. SMA: Patent without evidence of aneurysm, dissection, vasculitis or significant stenosis. Renals: Both renal arteries are patent without evidence of aneurysm, dissection, vasculitis, fibromuscular dysplasia or significant stenosis. IMA: Patent without evidence of aneurysm, dissection, vasculitis or significant stenosis. Inflow: Patent without evidence of aneurysm, dissection, vasculitis or significant stenosis. Veins: No obvious venous abnormality within the limitations of this arterial phase study. Review of the MIP images confirms the above findings. NON-VASCULAR Hepatobiliary: No focal liver abnormality is seen. No gallstones, gallbladder wall thickening, or biliary dilatation. Pancreas: Unremarkable. No pancreatic ductal dilatation or  surrounding inflammatory changes. Spleen: Normal in size without focal abnormality. Adrenals/Urinary Tract: Adrenal glands are unremarkable. Kidneys are normal, without renal calculi, focal lesion, or hydronephrosis. Bladder is unremarkable. Stomach/Bowel: Normal stomach. Small bowel and colon are normal in caliber. No wall thickening. No inflammation. No evidence of appendicitis. Lymphatic: No enlarged lymph nodes. Reproductive: Status post hysterectomy. No adnexal masses. Other: No abdominal wall hernia or abnormality. No abdominopelvic ascites. Musculoskeletal: Acute mild compression fracture of L2. Chronic mild compression fracture of L3. No other fractures. No bone lesions. Review of the MIP images confirms the above findings. IMPRESSION: CTA 1. No thoracoabdominal aortic dissection or aneurysm. 2. Mild aortic atherosclerosis. No penetrating ulcer or other findings to suggest acute aortic syndrome. 3. Aortic branch vessels are widely patent. NON CTA 1. Acute mild compression fracture of L2 as described on the earlier exams. 2. No other acute  abnormality. 3. Lungs demonstrate subpleural reticulation that has progressed compared to the prior CT of the chest on 08/24/2021 consistent with progression of interstitial lung disease. 4. Advanced centrilobular emphysema. Electronically Signed   By: Lajean Manes M.D.   On: 06/20/2022 13:28   CT Renal Stone Study  Result Date: 06/20/2022 CLINICAL DATA:  Abdominal pain. Low back pain for 6 days. Pain increased with movement. EXAM: CT ABDOMEN AND PELVIS WITHOUT CONTRAST TECHNIQUE: Multidetector CT imaging of the abdomen and pelvis was performed following the standard protocol without IV contrast. Multidetector CT imaging of the lumbar spine was performed without intravenous contrast administration. Multiplanar CT image reconstructions were also generated. RADIATION DOSE REDUCTION: This exam was performed according to the departmental dose-optimization program which  includes automated exposure control, adjustment of the mA and/or kV according to patient size and/or use of iterative reconstruction technique. COMPARISON:  Lumbar MRI, 10/10/2021 FINDINGS: Lower chest: Interstitial fibrotic changes.  No acute findings. Hepatobiliary: No focal liver abnormality is seen. No gallstones, gallbladder wall thickening, or biliary dilatation. Pancreas: Unremarkable. No pancreatic ductal dilatation or surrounding inflammatory changes. Spleen: Normal in size without focal abnormality. Adrenals/Urinary Tract: Adrenal glands are unremarkable. Kidneys are normal, without renal calculi, focal lesion, or hydronephrosis. Bladder is unremarkable. Stomach/Bowel: Normal stomach. Small bowel and colon are normal in caliber. No wall thickening. No inflammation. Scattered left colon diverticula. No diverticulitis. No evidence of appendicitis. Vascular/Lymphatic: Aortic atherosclerosis. No aneurysm. No enlarged lymph nodes. Reproductive: Status post hysterectomy. No adnexal masses. Other: No abdominal wall hernia or abnormality. No abdominopelvic ascites. Musculoskeletal: Recent/acute appearing mild compression fracture of L2. There is also mild compression fracture L3 of unclear chronicity. No other fractures. No bone lesions. CT LUMBAR SPINE FINDINGS Segmentation: 5 lumbar type vertebrae. Alignment: Mild levoscoliosis, apex at L2. Minor anterolisthesis, grade 1, L4 on L5. No other spondylolisthesis. Vertebrae: Mild compression fracture of the L2 vertebral body with depression of the upper endplate, which appears acute, and is new compared to the prior MRI. Mild compression fracture of L3, stable from the prior MRI, chronic. No other fractures. No bone lesions. Paraspinal and other soft tissues: Negative. Disc levels: Mild loss of disc height at L2-L3 and L4-L5. Moderate to marked right and mild left facet degenerative changes at L4-L5. No disc herniation. No significant central stenosis or lateral  recess narrowing. IMPRESSION: CT ABDOMEN AND PELVIS 1. No acute, non skeletal findings. 2. Aortic atherosclerosis. CT LUMBAR SPINE 1. Acute mild compression fracture of L2. Fracture is not protrude into the spinal canal or neural foramina. No other acute fractures or acute findings. 2. Chronic mild compression fracture of L3. Electronically Signed   By: Lajean Manes M.D.   On: 06/20/2022 12:00   CT L-SPINE NO CHARGE  Result Date: 06/20/2022 CLINICAL DATA:  Abdominal pain. Low back pain for 6 days. Pain increased with movement. EXAM: CT ABDOMEN AND PELVIS WITHOUT CONTRAST TECHNIQUE: Multidetector CT imaging of the abdomen and pelvis was performed following the standard protocol without IV contrast. Multidetector CT imaging of the lumbar spine was performed without intravenous contrast administration. Multiplanar CT image reconstructions were also generated. RADIATION DOSE REDUCTION: This exam was performed according to the departmental dose-optimization program which includes automated exposure control, adjustment of the mA and/or kV according to patient size and/or use of iterative reconstruction technique. COMPARISON:  Lumbar MRI, 10/10/2021 FINDINGS: Lower chest: Interstitial fibrotic changes.  No acute findings. Hepatobiliary: No focal liver abnormality is seen. No gallstones, gallbladder wall thickening, or biliary dilatation. Pancreas: Unremarkable. No  pancreatic ductal dilatation or surrounding inflammatory changes. Spleen: Normal in size without focal abnormality. Adrenals/Urinary Tract: Adrenal glands are unremarkable. Kidneys are normal, without renal calculi, focal lesion, or hydronephrosis. Bladder is unremarkable. Stomach/Bowel: Normal stomach. Small bowel and colon are normal in caliber. No wall thickening. No inflammation. Scattered left colon diverticula. No diverticulitis. No evidence of appendicitis. Vascular/Lymphatic: Aortic atherosclerosis. No aneurysm. No enlarged lymph nodes.  Reproductive: Status post hysterectomy. No adnexal masses. Other: No abdominal wall hernia or abnormality. No abdominopelvic ascites. Musculoskeletal: Recent/acute appearing mild compression fracture of L2. There is also mild compression fracture L3 of unclear chronicity. No other fractures. No bone lesions. CT LUMBAR SPINE FINDINGS Segmentation: 5 lumbar type vertebrae. Alignment: Mild levoscoliosis, apex at L2. Minor anterolisthesis, grade 1, L4 on L5. No other spondylolisthesis. Vertebrae: Mild compression fracture of the L2 vertebral body with depression of the upper endplate, which appears acute, and is new compared to the prior MRI. Mild compression fracture of L3, stable from the prior MRI, chronic. No other fractures. No bone lesions. Paraspinal and other soft tissues: Negative. Disc levels: Mild loss of disc height at L2-L3 and L4-L5. Moderate to marked right and mild left facet degenerative changes at L4-L5. No disc herniation. No significant central stenosis or lateral recess narrowing. IMPRESSION: CT ABDOMEN AND PELVIS 1. No acute, non skeletal findings. 2. Aortic atherosclerosis. CT LUMBAR SPINE 1. Acute mild compression fracture of L2. Fracture is not protrude into the spinal canal or neural foramina. No other acute fractures or acute findings. 2. Chronic mild compression fracture of L3. Electronically Signed   By: Lajean Manes M.D.   On: 06/20/2022 12:00    Procedures Procedures    Medications Ordered in ED Medications  sodium chloride (PF) 0.9 % injection (has no administration in time range)  lidocaine (LIDODERM) 5 % 1 patch (1 patch Transdermal Patch Applied 06/20/22 1344)  acetaminophen (TYLENOL) tablet 650 mg (has no administration in time range)    Or  acetaminophen (TYLENOL) suppository 650 mg (has no administration in time range)  ondansetron (ZOFRAN) tablet 4 mg (has no administration in time range)    Or  ondansetron (ZOFRAN) injection 4 mg (has no administration in time  range)  oxyCODONE (Oxy IR/ROXICODONE) immediate release tablet 5 mg (has no administration in time range)  sodium chloride 0.9 % bolus 500 mL (0 mLs Intravenous Stopped 06/20/22 1128)  fentaNYL (SUBLIMAZE) injection 25 mcg (25 mcg Intravenous Given 06/20/22 1028)  ondansetron (ZOFRAN) injection 4 mg (4 mg Intravenous Given 06/20/22 1028)  HYDROmorphone (DILAUDID) injection 0.5 mg (0.5 mg Intravenous Given 06/20/22 1218)  iohexol (OMNIPAQUE) 350 MG/ML injection 100 mL (100 mLs Intravenous Contrast Given 06/20/22 1252)  oxyCODONE (Oxy IR/ROXICODONE) immediate release tablet 10 mg (10 mg Oral Given 06/20/22 1344)  acetaminophen (TYLENOL) tablet 1,000 mg (1,000 mg Oral Given 06/20/22 1344)    ED Course/ Medical Decision Making/ A&P Clinical Course as of 06/20/22 1545  Mon Jun 20, 2022  1237 Discussed case with attending physician Dr. Billy Fischer.  Plan to perform dissection study for evaluation of possible aortic dissection [CR]  1415 Reevaluation of the patient showed oxygen saturation in the mid 70s when taken off nasal cannula.  Likely secondary to pain medication administration.  Patient lives at home by herself and daughter expresses desire for admission given inability of patient to ambulate or move independently as well as with difficulty controlling pain at home. [CR]  [place] neurosurgery Dr. Christella Noa regarding the patient who agreed with admission to hospital medicine.  No inpatient consultation needed at this time from neurosurgery. [CR]  [place].  Dr. Olevia Bowens was consulted by attending physician Dr. Billy Fischer who agreed with admission the patient assume further treatment/care. [CR]    Clinical Course User Index [CR] Wilnette Kales, PA                           Medical Decision Making Amount and/or Complexity of Data Reviewed Labs: ordered. Radiology: ordered.  Risk OTC drugs. Prescription drug management.   This patient presents to the ED for concern of  right flank pain, this involves an extensive number of treatment options, and is a complaint that carries with it a high risk of complications and morbidity.  The differential diagnosis includes nephrolithiasis, pyelonephritis, varicella-zoster, MSK pain, lumbar fracture, strain chest pain, dislocation, cauda equina, spinal epidural abscess, appendicitis, diverticulitis, SBO/LBO, volvulus, ovarian torsion   Co morbidities that complicate the patient evaluation  See HPI   Additional history obtained:  Additional history obtained from EMR External records from outside source obtained and reviewed including hospital records   Lab Tests:  I Ordered, and personally interpreted labs.  The pertinent results include: No leukocytosis.  Mild evidence anemia with a hemoglobin 11.0 which is near patient's baseline.  No platelet abnormalities.  UA significant for rare bacteria and small leukocytes indicative of urinary tract infection.  No electrolyte abnormalities appreciated.  No renal dysfunction.  No transaminitis.  Initial troponin of 4.   Imaging Studies ordered:  I ordered imaging studies including  CT angio chest abdomen pelvis for dissection: No thoracoabdominal aortic dissection or aneurysm.  Mild aortic atherosclerosis.  Aortic branch is widely patent.  Mild compression fracture of L2.  No other acute abnormalities.  Subpleural reticulation of lungs with progression from prior CT scan.  Advanced centrilobular emphysema. CT renal stone study: CT L-spine: No acute, mild skeletal findings.  Aortic atherosclerosis.  Acute mild compression fracture of L2.  Chronic mild compression fracture of L3 I independently visualized and interpreted imaging which showed as above I agree with the radiologist interpretation    Cardiac Monitoring: / EKG:  The patient was maintained on a cardiac monitor.  I personally viewed and interpreted the cardiac monitored which showed an underlying sinus  rhythm   Consultations Obtained:  See ED course  Problem List / ED Course / Critical interventions / Medication management  Lumbar compression fracture I ordered medication including Zofran for nausea, fentanyl and Dilaudid and oxycodone for pain.    Reevaluation of the patient after these medicines showed that the patient improved I have reviewed the patients home medicines and have made adjustments as needed   Social Determinants of Health:  Former cigarette use.  Denies illicit drug use.   Test / Admission - Considered:  Lumbar compression fracture Vitals signs within normal range and stable throughout visit. Laboratory/imaging studies significant for: See above Patient with acute L2 fracture as most likely cause of patient's pain.  Patient neurovascularly intact distally with no red flag signs indicative of potential cauda equina or other spinal cord impingement.  Multiple trials of pain medication were attempted on the emergency department to control patient's pain of which were not successful.  Patient was at home and patient and family were concerned about discharging patient given inadequate pain control as well as inability to ambulate at home and the amount of pain that she is in.  Hospital mission was pursued.  Hospital medicine Dr. Olevia Bowens was  consulted regarding patient who agreed with admission and assume further treatment/care.  Patient placed in LSO brace for further lumbar support.  Neurosurgery was consulted regarding patient who disagreed with consultation in the emergency department given neurovascularly intact and uncomplicated compression fracture of L2.  Treatment plan discussed at length with patient and she acknowledged understanding was agreeable to said plan.  Proper consultations were made by the emergency department.  Patient stable upon admission.         Final Clinical Impression(s) / ED Diagnoses Final diagnoses:  Closed compression fracture of L2  lumbar vertebra, initial encounter ([place])  Closed compression fracture of L3 lumbar vertebra with delayed healing, subsequent encounter    Rx / DC Orders ED Discharge Orders     None         Wilnette Kales, [place] 06/20/22 1545    Gareth Morgan, MD 06/21/22 0004

## 2022-06-20 NOTE — ED Triage Notes (Signed)
Pt BIBA from home. C/o R lower back px for 6x days. Went to UC, was given pain meds and muscle relaxers. Woke trhis am w/pain radiating around to L R abdomen. Increases with movement.   Aox4

## 2022-06-20 NOTE — H&P (Signed)
History and Physical    Patient: [name] ZDG:[id number] DOB: [birth year] DOA: 06/20/2022 DOS: the patient was seen and examined on 06/20/2022 PCP: Kathyrn Lass, MD  Patient coming from: Home  Chief Complaint:  Chief Complaint  Patient presents with   Back Pain   Abdominal Pain   HPI: [name] is a 81 y.o. female with medical history significant of chronic atrial fibrillation, history of chest pain with negative Myoview stress test, depression with anxiety, GERD, hemochromatosis, iron deficiency anemia, history of syncopal episode, hypothyroidism who presented to the emergency department complaints of right-sided lower back abdominal pain for the past 6 days that was initially under control after she went to the urgent care was given analgesics and muscle relaxants.  However, this morning the patient woke up with severe right-sided lumbar spine pain radiated to her right groin.  Pain got a lot worse with movement.  No fecal or urinary incontinence.  No paresthesias or sciatica pain.  No fever, chills or night sweats. No sore throat, rhinorrhea, dyspnea, wheezing or hemoptysis.  No chest pain, palpitations, diaphoresis, PND, orthopnea or pitting edema of the lower extremities.  No appetite changes, abdominal pain, diarrhea,  melena or hematochezia.  She occasionally gets constipated.  No flank pain, dysuria, frequency or hematuria.  No polyuria, polydipsia, polyphagia or blurred vision.  ED course: Initial vital signs were temperature 97.6, pulse 57 respirations 16,132/67 mmHg and O2 sat 95% on room air.  Lab work: Urinalysis shows small leukocyte esterase and rare bacteria microscopic examination.  CBC showed a white count 6.7, hemoglobin 11.0 g/dL platelets 264.  CMP showed a total protein of 6.4 g/dL and AST of 13.  The rest of the CMP measurements were normal.  Imaging: CT renal study show acute mild compression fracture.  Chronic mild compression fracture of L3.  No acute of  the abdomen or pelvis.  CTA chest/abdomen/pelvis no thoracoabdominal aortic dissection or aneurysm.  There is mild aortic atherosclerosis.  Aortic branch vessels widely patent.  L2 fracture is also seen.  Lungs demonstrate subpleural reticulation that has progressed compared to the prior CT of the chest from 10 months ago.  Advanced centrilobular emphysema.   Review of Systems: As mentioned in the history of present illness. All other systems reviewed and are negative.  Past Medical History:  Diagnosis Date   Atrial fibrillation ([place])    a. Flecainide therapy;  b. event monitor 4/12   Chest pain    a. GXT myoview 4/12: no isch., EF 86%;   b. 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 Beryle Beams   History of syncope    Hypothyroidism    Past Surgical History:  Procedure Laterality Date   MOUTH SURGERY     SHOULDER ARTHROSCOPY Left    TOTAL ABDOMINAL HYSTERECTOMY     VARICOSE VEIN SURGERY     Social History:  reports that she quit smoking about 24 years ago. Her smoking use included cigarettes. She has a 42.00 pack-year smoking history. She has never been exposed to tobacco smoke. She has never used smokeless tobacco. She reports current alcohol use. She reports that she does not use drugs.  No Known Allergies  Family History  Problem Relation Age of Onset   Stroke Mother    Heart disease Father 38   Hemochromatosis Brother  Breast cancer Neg Hx     Prior to Admission medications   Medication Sig Start Date End Date Taking? Authorizing Provider  acetaminophen (TYLENOL) 325 MG tablet Take 650 mg by mouth every 6 (six) hours as needed for mild pain or headache.    [provider]  apixaban (ELIQUIS) 5 MG TABS tablet TAKE 1 TABLET(5 MG) BY MOUTH TWICE DAILY 12/24/21   Deboraha Sprang, MD  atorvastatin (LIPITOR) 10 MG tablet Take 10 mg  by mouth daily. 06/18/19   [provider]  Cyanocobalamin (VITAMIN B 12 PO) Take 1,000 mcg by mouth daily.    [provider]  doxycycline (VIBRA-TABS) 100 MG tablet Take 1 tablet (100 mg total) by mouth 2 (two) times daily. 05/24/22   Brand Males, MD  ferrous sulfate 325 (65 FE) MG EC tablet Take 1 tablet (325 mg total) by mouth daily. For best absorption take on empty stomach with fruit juice (ex: orange juice). 05/12/22   Orson Slick, MD  flecainide (TAMBOCOR) 100 MG tablet Take 1 tablet (100 mg total) by mouth 2 (two) times daily. 12/24/21   Deboraha Sprang, MD  folic acid (FOLVITE) 1 MG tablet Take 1 mg by mouth daily.    [provider]  levothyroxine (SYNTHROID, LEVOTHROID) 112 MCG tablet Take 112 mcg by mouth daily.    [provider]  methocarbamol (ROBAXIN) 500 MG tablet Take 1 tablet (500 mg total) by mouth daily as needed for muscle spasms. 04/18/22   Ofilia Neas, PA-C  metoprolol succinate (TOPROL-XL) 25 MG 24 hr tablet TAKE 1 TABLET BY MOUTH  DAILY 05/29/17   Deboraha Sprang, MD  mycophenolate (MYFORTIC) 360 MG TBEC EC tablet TAKE 1 TABLET BY MOUTH TWICE DAILY 06/29/21   Brand Males, MD  nitroGLYCERIN (NITROGLYN) 2 % ointment Apply 1/4 inch to fingertips three times daily as needed. 08/13/21   Ofilia Neas, PA-C  ondansetron (ZOFRAN ODT) 4 MG disintegrating tablet Take 1-2 tablets three times daily as needed 09/11/19   Brand Males, MD  pantoprazole (PROTONIX) 20 MG tablet Take 20 mg by mouth 2 (two) times daily. 11/10/21   [provider]  predniSONE (DELTASONE) 10 MG tablet Take 4 tabs for 2 days, then 3 tabs for 2 days, 2 tabs for 2 days, then 1 tab for 2 days, then stop. 06/08/22   Magdalen Spatz, NP  SPIRIVA RESPIMAT 2.5 MCG/ACT AERS INHALE 2 PUFFS INTO THE LUNGS DAILY 01/27/22   Brand Males, MD  sulfamethoxazole-trimethoprim (BACTRIM DS) 800-160 MG tablet TAKE 1 TABLET BY MOUTH ON MONDAY, WEDNESDAY, AND FRIDAY  12/29/21   Brand Males, MD  Vitamin D, Cholecalciferol, 25 MCG (1000 UT) TABS Take 1 tablet by mouth daily.    [provider]    Physical Exam: Vitals:   06/20/22 1405 06/20/22 1418 06/20/22 1430 06/20/22 1509  BP:   (!) 110/56 112/68  Pulse: (!) 58  62 71  Resp: '10  12 18  '$ Temp:  98 F (36.7 C)  97.8 F (36.6 C)  TempSrc:  Oral  Oral  SpO2: (!) 89%  94% 97%  Weight:       Physical Exam Vitals and nursing note reviewed.  Constitutional:      General: She is awake. She is not in acute distress.    Appearance: She is well-developed and normal weight.  HENT:     Head: Normocephalic.     Nose: No rhinorrhea.     Mouth/Throat:  Mouth: Mucous membranes are moist.  Eyes:     General: No scleral icterus.    Pupils: Pupils are equal, round, and reactive to light.  Neck:     Vascular: No JVD.  Cardiovascular:     Rate and Rhythm: Normal rate. Rhythm irregular.     Heart sounds: S1 normal and S2 normal.  Pulmonary:     Effort: Pulmonary effort is normal.     Breath sounds: Normal breath sounds. No wheezing, rhonchi or rales.  Abdominal:     General: There is no distension.     Palpations: Abdomen is soft.     Tenderness: There is no abdominal tenderness. There is no right CVA tenderness or left CVA tenderness.  Musculoskeletal:     Cervical back: Neck supple.     Lumbar back: Decreased range of motion.     Right lower leg: No edema.     Left lower leg: No edema.  Skin:    General: Skin is warm and dry.  Neurological:     General: No focal deficit present.     Mental Status: She is alert and oriented to person, place, and time.  Psychiatric:        Mood and Affect: Mood normal.        Behavior: Behavior normal. Behavior is cooperative.   Data Reviewed:  There are no new results to review at this time.  Assessment and Plan: Principal Problem:   Intractable back pain Observation/telemetry Analgesics as needed. Muscle relaxants as  needed. Antiemetics as needed. Consult physical therapy.  Active Problems:   ATRIAL FIBRILLATION Continue flecainide 100 mg p.o. twice daily Continue DOAC. Continue metoprolol 25 mg p.o. daily. Keep electrolytes optimized.    Pulmonary emphysema ([facility]) Continue supplemental oxygen. Bronchodilators as needed. Continue S.  Eval daily.    Scl-70 antibody positive Continue mycophenolate and physiological prednisone.    History of hypothyroidism Continue levothyroxine 112 mcg p.o. daily.    History of hemochromatosis Patient stated she is taking iron per hematology.     Advance Care Planning:   Code Status: Full Code   Consults:   Family Communication:   Severity of Illness: The appropriate patient status for this patient is INPATIENT. Inpatient status is judged to be reasonable and necessary in order to provide the required intensity of service to ensure the patient's safety. The patient's presenting symptoms, physical exam findings, and initial radiographic and laboratory data in the context of their chronic comorbidities is felt to place them at high risk for further clinical deterioration. Furthermore, it is not anticipated that the patient will be medically stable for discharge from the hospital within 2 midnights of admission.   * I certify that at the point of admission it is my clinical judgment that the patient will require inpatient hospital care spanning beyond 2 midnights from the point of admission due to high intensity of service, high risk for further deterioration and high frequency of surveillance required.*  Author: Reubin Milan, MD 06/20/2022 3:27 PM  For on call review [email or web address].   This document was prepared using Dragon voice recognition software and may contain some unintended transcription errors.

## 2022-06-20 NOTE — ED Notes (Addendum)
Attempted to ambulate pt to toilet. Pt c/o excruciating pain in back/side, and asked to be put back into bed.

## 2022-06-20 NOTE — Discharge Instructions (Signed)
Note the workup today was overall consistent with acute lumbar fraction of L3.  As discussed, will treat with oral pain medicine outpatient in the form of oxycodone 10 mg as well as back brace.  Wear back brace until follow-up with neurosurgery to allow for proper healing of your lower back.  Call neurosurgery the earliest convenience Dr. Christella Noa to set up an appointment for reevaluation.  Please do not hesitate to return to emergency department if the worrisome signs and symptoms we discussed become apparent.

## 2022-06-21 ENCOUNTER — Encounter (HOSPITAL_COMMUNITY): Payer: Self-pay

## 2022-06-21 ENCOUNTER — Inpatient Hospital Stay (HOSPITAL_COMMUNITY): Payer: Medicare Other

## 2022-06-21 ENCOUNTER — Other Ambulatory Visit: Payer: Medicare Other

## 2022-06-21 DIAGNOSIS — Z7969 Long term (current) use of other immunomodulators and immunosuppressants: Secondary | ICD-10-CM | POA: Diagnosis not present

## 2022-06-21 DIAGNOSIS — S32020A Wedge compression fracture of second lumbar vertebra, initial encounter for closed fracture: Principal | ICD-10-CM | POA: Diagnosis present

## 2022-06-21 DIAGNOSIS — M8088XA Other osteoporosis with current pathological fracture, vertebra(e), initial encounter for fracture: Secondary | ICD-10-CM | POA: Diagnosis present

## 2022-06-21 DIAGNOSIS — E440 Moderate protein-calorie malnutrition: Secondary | ICD-10-CM | POA: Diagnosis present

## 2022-06-21 DIAGNOSIS — Z79899 Other long term (current) drug therapy: Secondary | ICD-10-CM | POA: Diagnosis not present

## 2022-06-21 DIAGNOSIS — E785 Hyperlipidemia, unspecified: Secondary | ICD-10-CM | POA: Diagnosis present

## 2022-06-21 DIAGNOSIS — Z8249 Family history of ischemic heart disease and other diseases of the circulatory system: Secondary | ICD-10-CM | POA: Diagnosis not present

## 2022-06-21 DIAGNOSIS — K219 Gastro-esophageal reflux disease without esophagitis: Secondary | ICD-10-CM | POA: Diagnosis present

## 2022-06-21 DIAGNOSIS — K5900 Constipation, unspecified: Secondary | ICD-10-CM | POA: Diagnosis present

## 2022-06-21 DIAGNOSIS — J8410 Pulmonary fibrosis, unspecified: Secondary | ICD-10-CM | POA: Diagnosis present

## 2022-06-21 DIAGNOSIS — Z7901 Long term (current) use of anticoagulants: Secondary | ICD-10-CM | POA: Diagnosis not present

## 2022-06-21 DIAGNOSIS — D899 Disorder involving the immune mechanism, unspecified: Secondary | ICD-10-CM | POA: Diagnosis present

## 2022-06-21 DIAGNOSIS — R21 Rash and other nonspecific skin eruption: Secondary | ICD-10-CM | POA: Diagnosis present

## 2022-06-21 DIAGNOSIS — I480 Paroxysmal atrial fibrillation: Secondary | ICD-10-CM

## 2022-06-21 DIAGNOSIS — F32A Depression, unspecified: Secondary | ICD-10-CM | POA: Diagnosis present

## 2022-06-21 DIAGNOSIS — J439 Emphysema, unspecified: Secondary | ICD-10-CM | POA: Diagnosis present

## 2022-06-21 DIAGNOSIS — J432 Centrilobular emphysema: Secondary | ICD-10-CM

## 2022-06-21 DIAGNOSIS — Z8639 Personal history of other endocrine, nutritional and metabolic disease: Secondary | ICD-10-CM | POA: Diagnosis not present

## 2022-06-21 DIAGNOSIS — Z6825 Body mass index (BMI) 25.0-25.9, adult: Secondary | ICD-10-CM | POA: Diagnosis not present

## 2022-06-21 DIAGNOSIS — Z79890 Hormone replacement therapy (postmenopausal): Secondary | ICD-10-CM | POA: Diagnosis not present

## 2022-06-21 DIAGNOSIS — F419 Anxiety disorder, unspecified: Secondary | ICD-10-CM | POA: Diagnosis present

## 2022-06-21 DIAGNOSIS — Z823 Family history of stroke: Secondary | ICD-10-CM | POA: Diagnosis not present

## 2022-06-21 DIAGNOSIS — Z87891 Personal history of nicotine dependence: Secondary | ICD-10-CM | POA: Diagnosis not present

## 2022-06-21 DIAGNOSIS — M549 Dorsalgia, unspecified: Secondary | ICD-10-CM | POA: Diagnosis present

## 2022-06-21 DIAGNOSIS — E876 Hypokalemia: Secondary | ICD-10-CM | POA: Diagnosis present

## 2022-06-21 DIAGNOSIS — I482 Chronic atrial fibrillation, unspecified: Secondary | ICD-10-CM | POA: Diagnosis present

## 2022-06-21 DIAGNOSIS — E039 Hypothyroidism, unspecified: Secondary | ICD-10-CM | POA: Diagnosis present

## 2022-06-21 LAB — COMPREHENSIVE METABOLIC PANEL
ALT: 10 U/L (ref 0–44)
AST: 12 U/L — ABNORMAL LOW (ref 15–41)
Albumin: 3 g/dL — ABNORMAL LOW (ref 3.5–5.0)
Alkaline Phosphatase: 53 U/L (ref 38–126)
Anion gap: 6 (ref 5–15)
BUN: 6 mg/dL — ABNORMAL LOW (ref 8–23)
CO2: 29 mmol/L (ref 22–32)
Calcium: 8.4 mg/dL — ABNORMAL LOW (ref 8.9–10.3)
Chloride: 105 mmol/L (ref 98–111)
Creatinine, Ser: 0.55 mg/dL (ref 0.44–1.00)
GFR, Estimated: >60 mL/min (ref >60)
Glucose, Bld: 87 mg/dL (ref 70–99)
Potassium: 3.3 mmol/L — ABNORMAL LOW (ref 3.5–5.1)
Sodium: 140 mmol/L (ref 135–145)
Total Bilirubin: 0.4 mg/dL (ref 0.3–1.2)
Total Protein: 5.2 g/dL — ABNORMAL LOW (ref 6.5–8.1)

## 2022-06-21 LAB — CBC
HCT: 33.3 % — ABNORMAL LOW (ref 36.0–46.0)
Hemoglobin: 10.2 g/dL — ABNORMAL LOW (ref 12.0–15.0)
MCH: 31 pg (ref 26.0–34.0)
MCHC: 30.6 g/dL (ref 30.0–36.0)
MCV: 101.2 fL — ABNORMAL HIGH (ref 80.0–100.0)
Platelets: 238 10*3/uL (ref 150–400)
RBC: 3.29 MIL/uL — ABNORMAL LOW (ref 3.87–5.11)
RDW: 20.3 % — ABNORMAL HIGH (ref 11.5–15.5)
WBC: 6.6 10*3/uL (ref 4.0–10.5)
nRBC: 0 % (ref 0.0–0.2)

## 2022-06-21 LAB — URINE CULTURE: Culture: NO GROWTH

## 2022-06-21 MED ORDER — LORAZEPAM 2 MG/ML IJ SOLN
0.2500 mg | Freq: Once | INTRAMUSCULAR | Status: AC
  Administered 2022-06-21: 0.25 mg via INTRAVENOUS
  Filled 2022-06-21: qty 1

## 2022-06-21 MED ORDER — POLYETHYLENE GLYCOL 3350 17 G PO PACK
17.0000 g | PACK | Freq: Two times a day (BID) | ORAL | Status: AC
  Filled 2022-06-21: qty 1

## 2022-06-21 MED ORDER — ACETAMINOPHEN 325 MG PO TABS
650.0000 mg | ORAL_TABLET | ORAL | Status: DC | PRN
  Administered 2022-06-21 (×2): 650 mg via ORAL
  Filled 2022-06-21 (×3): qty 2

## 2022-06-21 MED ORDER — ACETAMINOPHEN 650 MG RE SUPP: 650.0000 mg | Freq: Four times a day (QID) | RECTAL | Status: DC | PRN

## 2022-06-21 MED ORDER — BISACODYL 5 MG PO TBEC: 5.0000 mg | DELAYED_RELEASE_TABLET | Freq: Every day | ORAL | Status: DC | PRN

## 2022-06-21 MED ORDER — KETOROLAC TROMETHAMINE 15 MG/ML IJ SOLN
15.0000 mg | Freq: Three times a day (TID) | INTRAMUSCULAR | Status: DC | PRN
  Administered 2022-06-21: 15 mg via INTRAVENOUS
  Filled 2022-06-21: qty 1

## 2022-06-21 MED ORDER — SENNOSIDES-DOCUSATE SODIUM 8.6-50 MG PO TABS
2.0000 | ORAL_TABLET | Freq: Two times a day (BID) | ORAL | Status: DC
  Administered 2022-06-22 – 2022-06-24 (×5): 2 via ORAL
  Filled 2022-06-21 (×6): qty 2

## 2022-06-21 MED ORDER — POTASSIUM CHLORIDE CRYS ER 20 MEQ PO TBCR
40.0000 meq | EXTENDED_RELEASE_TABLET | Freq: Once | ORAL | Status: AC
  Administered 2022-06-21: 40 meq via ORAL
  Filled 2022-06-21: qty 2

## 2022-06-21 NOTE — Progress Notes (Signed)
Patient has not had a BM since 06/17/22, per patient. MD aware, Senna/Miralax ordered. Pt does not want to take at this time d/t pain- pt waiting to hear from radiologist to know the plan before she take medication that will make her have to get up to use BR.

## 2022-06-21 NOTE — Progress Notes (Signed)
[facility]                                                                               Tkai Large, is a 81 y.o. female, DOB - [birth year], IWP:[id number] Admit date - 06/20/2022    Outpatient Primary MD for the patient is Kathyrn Lass, MD  LOS - 0  days    Brief summary   [name] is a 81 y.o. female with medical history significant of chronic atrial fibrillation, history of chest pain with negative Myoview stress test, depression with anxiety, GERD, hemochromatosis, iron deficiency anemia, history of syncopal episode, hypothyroidism who presented to the emergency department complaints of right-sided lower back abdominal pain for the past 6 days that was initially under control after she went to the urgent care was given analgesics and muscle relaxants.  However, this morning the patient woke up with severe right-sided lumbar spine pain radiated to her right groin.   CT renal study show acute mild compression fracture. Chronic mild compression fracture of L3. No acute of the abdomen or pelvis. CTA chest/abdomen/pelvis no thoracoabdominal aortic dissection or aneurysm. There is mild aortic atherosclerosis. Aortic branch vessels widely patent. L2 fracture is also seen    Assessment & Plan    Assessment and Plan:   Intractable back pain secondary to L2 compression fracture Pain control with IV Toradol and oral oxycodone, pain is not well-controlled at this time.  IR consulted for possible kyphoplasty. Symptomatic management with muscle relaxants and antiemetics. Therapy evaluations have been ordered.    Chronic atrial fibrillation Continue with flecainide 100 mg twice daily and Eliquis for anticoagulation. Continue with metoprolol.    History of emphysema and pulmonary fibrosis Nasal cannula oxygen as needed    History of hypothyroidism Continue with Synthroid 112 mcg daily.   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   Weight as of this encounter: 64.5 kg.  Code Status: Full code DVT Prophylaxis:   apixaban (ELIQUIS) tablet 5 mg   Level of Care: Level of care: Telemetry Family Communication: Family at bedside  Disposition Plan:     Remains inpatient appropriate:  appropriate pain control.   Procedures:  None  Consultants: IR consult   Antimicrobials:   Anti-infectives (From admission, onward)    Start     Dose/Rate Route Frequency Ordered Stop   06/21/22 0000  sulfamethoxazole-trimethoprim (BACTRIM DS) 800-160 MG per tablet 1 tablet        1 tablet Oral Once per day on Mon Wed Fri 06/20/22 1815     06/20/22 2200  amoxicillin-clavulanate (AUGMENTIN) 875-125 MG per tablet 1 tablet        1 tablet Oral Every 12 hours 06/20/22 1815 06/21/22 1002        Medications  Scheduled Meds:  apixaban  5 mg Oral BID   atorvastatin  10 mg Oral Daily   cholecalciferol  1,000 Units Oral Daily   ferrous sulfate  325 mg Oral Q breakfast  flecainide  100 mg Oral BID   folic acid  1 mg Oral Daily   levothyroxine  112 mcg Oral Daily   lidocaine  1 patch Transdermal Q24H   metoprolol succinate  25 mg Oral Daily   mycophenolate  360 mg Oral BID   pantoprazole  20 mg Oral BID   polyethylene glycol  17 g Oral BID   senna-docusate  2 tablet Oral BID   sulfamethoxazole-trimethoprim  1 tablet Oral Once per day on Mon Wed Fri   trimethoprim-polymyxin b  1 drop Both Eyes TID   umeclidinium bromide  1 puff Inhalation Daily   Continuous Infusions: PRN Meds:.acetaminophen **OR** acetaminophen, alum & mag hydroxide-simeth **AND** lidocaine, bisacodyl, ketorolac, methocarbamol, Muscle Rub, ondansetron **OR** ondansetron (ZOFRAN) IV, oxyCODONE    Subjective:   [name] was seen and examined today.  Pain not well controlled. Added  toradol.   Objective:   Vitals:   06/21/22 0259 06/21/22 0704 06/21/22 0957 06/21/22 1045  BP: 111/60 112/66  126/74  Pulse: (!) 58 61  61  Resp: '20 20  18  '$ Temp: 98.1 F (36.7 C) 98.2 F (36.8 C)  97.9 F (36.6 C)  TempSrc: Oral Oral  Oral  SpO2: 95% 94% 95% 98%  Weight:      Height:        Intake/Output Summary (Last 24 hours) at 06/21/2022 1229 Last data filed at 06/21/2022 1100 Gross per 24 hour  Intake 120 ml  Output 1700 ml  Net -1580 ml   Filed Weights   06/20/22 0946 06/20/22 2300  Weight: 62.6 kg 64.5 kg     Exam General exam: Appears calm and comfortable  Respiratory system: Clear to auscultation. Respiratory effort normal. Cardiovascular system: S1 & S2 heard, RRR. No JVD,  Gastrointestinal system: Abdomen is nondistended, soft and nontender.  Central nervous system: Alert and oriented. No focal neurological deficits. Extremities: Symmetric 5 x 5 power. Skin: No rashes, lesions or ulcers Psychiatry:  Mood & affect appropriate.     Data Reviewed:  I have personally reviewed following labs and imaging studies   CBC Lab Results  Component Value Date   WBC 6.6 06/21/2022   RBC 3.29 (L) 06/21/2022   HGB 10.2 (L) 06/21/2022   HCT 33.3 (L) 06/21/2022   MCV 101.2 (H) 06/21/2022   MCH 31.0 06/21/2022   PLT 238 06/21/2022   MCHC 30.6 06/21/2022   RDW 20.3 (H) 06/21/2022   LYMPHSABS 2.0 06/20/2022   MONOABS 0.7 06/20/2022   EOSABS 0.2 06/20/2022   BASOSABS 0.0 02/63/7858     Last metabolic panel Lab Results  Component Value Date   NA 140 06/21/2022   K 3.3 (L) 06/21/2022   CL 105 06/21/2022   CO2 29 06/21/2022   BUN 6 (L) 06/21/2022   CREATININE 0.55 06/21/2022   GLUCOSE 87 06/21/2022   GFRNONAA >60 06/21/2022   GFRAA 101 06/09/2020   CALCIUM 8.4 (L) 06/21/2022   PHOS 3.1 05/20/2013   PROT 5.2 (L) 06/21/2022   ALBUMIN 3.0 (L) 06/21/2022   LABGLOB 2.4 10/10/2016   AGRATIO 1.8 10/10/2016   BILITOT 0.4 06/21/2022   ALKPHOS 53  06/21/2022   AST 12 (L) 06/21/2022   ALT 10 06/21/2022   ANIONGAP 6 06/21/2022    CBG (last 3)  No results for input(s): "GLUCAP" in the last 72 hours.    Coagulation Profile: No results for input(s): "INR", "PROTIME" in the last 168 hours.   Radiology Studies: CT Angio Chest/Abd/Pel for Dissection W  and/or Wo Contrast  Result Date: 06/20/2022 CLINICAL DATA:  Right lower back pain for 6 days. Pain extending to the abdomen this morning. EXAM: CT ANGIOGRAPHY CHEST, ABDOMEN AND PELVIS TECHNIQUE: Non-contrast CT of the chest was initially obtained. Multidetector CT imaging through the chest, abdomen and pelvis was performed using the standard protocol during bolus administration of intravenous contrast. Multiplanar reconstructed images and MIPs were obtained and reviewed to evaluate the vascular anatomy. RADIATION DOSE REDUCTION: This exam was performed according to the departmental dose-optimization program which includes automated exposure control, adjustment of the mA and/or kV according to patient size and/or use of iterative reconstruction technique. CONTRAST:  185m OMNIPAQUE IOHEXOL 350 MG/ML SOLN COMPARISON:  Unenhanced CT of the abdomen and pelvis and CT of the lumbar spine performed earlier today. FINDINGS: CTA CHEST FINDINGS Cardiovascular: Heart top-normal in size. No pericardial effusion. Mild three-vessel coronary artery calcifications. Great vessels are normal in caliber. Thoracic aorta mild atherosclerosis. No dissection. Arch branch vessels are widely patent. Mediastinum/Nodes: No neck base, mediastinal or hilar masses or enlarged lymph nodes. Trachea and esophagus are unremarkable. Lungs/Pleura: Mild subpleural reticulation mostly in the dependent lower lobes. Advanced centrilobular emphysema. No lung mass or nodule. No consolidation to suggest pneumonia and no pulmonary edema. No pleural effusion or pneumothorax. Musculoskeletal: No fracture or acute finding. No bone lesion. No chest  wall mass. Review of the MIP images confirms the above findings. CTA ABDOMEN AND PELVIS FINDINGS VASCULAR Aorta: Aorta normal in caliber. No dissection. Partly calcified atherosclerotic plaque, relatively mild. No penetrating ulcer. Celiac: Patent without evidence of aneurysm, dissection, vasculitis or significant stenosis. SMA: Patent without evidence of aneurysm, dissection, vasculitis or significant stenosis. Renals: Both renal arteries are patent without evidence of aneurysm, dissection, vasculitis, fibromuscular dysplasia or significant stenosis. IMA: Patent without evidence of aneurysm, dissection, vasculitis or significant stenosis. Inflow: Patent without evidence of aneurysm, dissection, vasculitis or significant stenosis. Veins: No obvious venous abnormality within the limitations of this arterial phase study. Review of the MIP images confirms the above findings. NON-VASCULAR Hepatobiliary: No focal liver abnormality is seen. No gallstones, gallbladder wall thickening, or biliary dilatation. Pancreas: Unremarkable. No pancreatic ductal dilatation or surrounding inflammatory changes. Spleen: Normal in size without focal abnormality. Adrenals/Urinary Tract: Adrenal glands are unremarkable. Kidneys are normal, without renal calculi, focal lesion, or hydronephrosis. Bladder is unremarkable. Stomach/Bowel: Normal stomach. Small bowel and colon are normal in caliber. No wall thickening. No inflammation. No evidence of appendicitis. Lymphatic: No enlarged lymph nodes. Reproductive: Status post hysterectomy. No adnexal masses. Other: No abdominal wall hernia or abnormality. No abdominopelvic ascites. Musculoskeletal: Acute mild compression fracture of L2. Chronic mild compression fracture of L3. No other fractures. No bone lesions. Review of the MIP images confirms the above findings. IMPRESSION: CTA 1. No thoracoabdominal aortic dissection or aneurysm. 2. Mild aortic atherosclerosis. No penetrating ulcer or other  findings to suggest acute aortic syndrome. 3. Aortic branch vessels are widely patent. NON CTA 1. Acute mild compression fracture of L2 as described on the earlier exams. 2. No other acute abnormality. 3. Lungs demonstrate subpleural reticulation that has progressed compared to the prior CT of the chest on 08/24/2021 consistent with progression of interstitial lung disease. 4. Advanced centrilobular emphysema. Electronically Signed   By: DLajean ManesM.D.   On: 06/20/2022 13:28   CT Renal Stone Study  Result Date: 06/20/2022 CLINICAL DATA:  Abdominal pain. Low back pain for 6 days. Pain increased with movement. EXAM: CT ABDOMEN AND PELVIS WITHOUT CONTRAST TECHNIQUE: Multidetector CT imaging  of the abdomen and pelvis was performed following the standard protocol without IV contrast. Multidetector CT imaging of the lumbar spine was performed without intravenous contrast administration. Multiplanar CT image reconstructions were also generated. RADIATION DOSE REDUCTION: This exam was performed according to the departmental dose-optimization program which includes automated exposure control, adjustment of the mA and/or kV according to patient size and/or use of iterative reconstruction technique. COMPARISON:  Lumbar MRI, 10/10/2021 FINDINGS: Lower chest: Interstitial fibrotic changes.  No acute findings. Hepatobiliary: No focal liver abnormality is seen. No gallstones, gallbladder wall thickening, or biliary dilatation. Pancreas: Unremarkable. No pancreatic ductal dilatation or surrounding inflammatory changes. Spleen: Normal in size without focal abnormality. Adrenals/Urinary Tract: Adrenal glands are unremarkable. Kidneys are normal, without renal calculi, focal lesion, or hydronephrosis. Bladder is unremarkable. Stomach/Bowel: Normal stomach. Small bowel and colon are normal in caliber. No wall thickening. No inflammation. Scattered left colon diverticula. No diverticulitis. No evidence of appendicitis.  Vascular/Lymphatic: Aortic atherosclerosis. No aneurysm. No enlarged lymph nodes. Reproductive: Status post hysterectomy. No adnexal masses. Other: No abdominal wall hernia or abnormality. No abdominopelvic ascites. Musculoskeletal: Recent/acute appearing mild compression fracture of L2. There is also mild compression fracture L3 of unclear chronicity. No other fractures. No bone lesions. CT LUMBAR SPINE FINDINGS Segmentation: 5 lumbar type vertebrae. Alignment: Mild levoscoliosis, apex at L2. Minor anterolisthesis, grade 1, L4 on L5. No other spondylolisthesis. Vertebrae: Mild compression fracture of the L2 vertebral body with depression of the upper endplate, which appears acute, and is new compared to the prior MRI. Mild compression fracture of L3, stable from the prior MRI, chronic. No other fractures. No bone lesions. Paraspinal and other soft tissues: Negative. Disc levels: Mild loss of disc height at L2-L3 and L4-L5. Moderate to marked right and mild left facet degenerative changes at L4-L5. No disc herniation. No significant central stenosis or lateral recess narrowing. IMPRESSION: CT ABDOMEN AND PELVIS 1. No acute, non skeletal findings. 2. Aortic atherosclerosis. CT LUMBAR SPINE 1. Acute mild compression fracture of L2. Fracture is not protrude into the spinal canal or neural foramina. No other acute fractures or acute findings. 2. Chronic mild compression fracture of L3. Electronically Signed   By: Lajean Manes M.D.   On: 06/20/2022 12:00   CT L-SPINE NO CHARGE  Result Date: 06/20/2022 CLINICAL DATA:  Abdominal pain. Low back pain for 6 days. Pain increased with movement. EXAM: CT ABDOMEN AND PELVIS WITHOUT CONTRAST TECHNIQUE: Multidetector CT imaging of the abdomen and pelvis was performed following the standard protocol without IV contrast. Multidetector CT imaging of the lumbar spine was performed without intravenous contrast administration. Multiplanar CT image reconstructions were also  generated. RADIATION DOSE REDUCTION: This exam was performed according to the departmental dose-optimization program which includes automated exposure control, adjustment of the mA and/or kV according to patient size and/or use of iterative reconstruction technique. COMPARISON:  Lumbar MRI, 10/10/2021 FINDINGS: Lower chest: Interstitial fibrotic changes.  No acute findings. Hepatobiliary: No focal liver abnormality is seen. No gallstones, gallbladder wall thickening, or biliary dilatation. Pancreas: Unremarkable. No pancreatic ductal dilatation or surrounding inflammatory changes. Spleen: Normal in size without focal abnormality. Adrenals/Urinary Tract: Adrenal glands are unremarkable. Kidneys are normal, without renal calculi, focal lesion, or hydronephrosis. Bladder is unremarkable. Stomach/Bowel: Normal stomach. Small bowel and colon are normal in caliber. No wall thickening. No inflammation. Scattered left colon diverticula. No diverticulitis. No evidence of appendicitis. Vascular/Lymphatic: Aortic atherosclerosis. No aneurysm. No enlarged lymph nodes. Reproductive: Status post hysterectomy. No adnexal masses. Other: No abdominal wall hernia  or abnormality. No abdominopelvic ascites. Musculoskeletal: Recent/acute appearing mild compression fracture of L2. There is also mild compression fracture L3 of unclear chronicity. No other fractures. No bone lesions. CT LUMBAR SPINE FINDINGS Segmentation: 5 lumbar type vertebrae. Alignment: Mild levoscoliosis, apex at L2. Minor anterolisthesis, grade 1, L4 on L5. No other spondylolisthesis. Vertebrae: Mild compression fracture of the L2 vertebral body with depression of the upper endplate, which appears acute, and is new compared to the prior MRI. Mild compression fracture of L3, stable from the prior MRI, chronic. No other fractures. No bone lesions. Paraspinal and other soft tissues: Negative. Disc levels: Mild loss of disc height at L2-L3 and L4-L5. Moderate to marked  right and mild left facet degenerative changes at L4-L5. No disc herniation. No significant central stenosis or lateral recess narrowing. IMPRESSION: CT ABDOMEN AND PELVIS 1. No acute, non skeletal findings. 2. Aortic atherosclerosis. CT LUMBAR SPINE 1. Acute mild compression fracture of L2. Fracture is not protrude into the spinal canal or neural foramina. No other acute fractures or acute findings. 2. Chronic mild compression fracture of L3. Electronically Signed   By: Lajean Manes M.D.   On: 06/20/2022 12:00       Hosie Poisson M.D. [facility] 06/21/2022, 12:29 PM  Available via Epic secure chat 7am-7pm After 7 pm, please refer to night coverage provider listed on amion.

## 2022-06-22 ENCOUNTER — Other Ambulatory Visit: Payer: Self-pay | Admitting: Internal Medicine

## 2022-06-22 ENCOUNTER — Ambulatory Visit: Payer: Medicare Other | Admitting: Physician Assistant

## 2022-06-22 DIAGNOSIS — Z8639 Personal history of other endocrine, nutritional and metabolic disease: Secondary | ICD-10-CM

## 2022-06-22 DIAGNOSIS — J849 Interstitial pulmonary disease, unspecified: Secondary | ICD-10-CM

## 2022-06-22 DIAGNOSIS — M5136 Other intervertebral disc degeneration, lumbar region: Secondary | ICD-10-CM

## 2022-06-22 DIAGNOSIS — Z8679 Personal history of other diseases of the circulatory system: Secondary | ICD-10-CM

## 2022-06-22 DIAGNOSIS — R760 Raised antibody titer: Secondary | ICD-10-CM

## 2022-06-22 DIAGNOSIS — Z79899 Other long term (current) drug therapy: Secondary | ICD-10-CM

## 2022-06-22 DIAGNOSIS — Z87891 Personal history of nicotine dependence: Secondary | ICD-10-CM

## 2022-06-22 DIAGNOSIS — M549 Dorsalgia, unspecified: Secondary | ICD-10-CM | POA: Diagnosis not present

## 2022-06-22 DIAGNOSIS — R918 Other nonspecific abnormal finding of lung field: Secondary | ICD-10-CM

## 2022-06-22 DIAGNOSIS — M810 Age-related osteoporosis without current pathological fracture: Secondary | ICD-10-CM

## 2022-06-22 DIAGNOSIS — Z7952 Long term (current) use of systemic steroids: Secondary | ICD-10-CM

## 2022-06-22 DIAGNOSIS — M19042 Primary osteoarthritis, left hand: Secondary | ICD-10-CM

## 2022-06-22 DIAGNOSIS — M349 Systemic sclerosis, unspecified: Secondary | ICD-10-CM

## 2022-06-22 DIAGNOSIS — J439 Emphysema, unspecified: Secondary | ICD-10-CM

## 2022-06-22 DIAGNOSIS — E559 Vitamin D deficiency, unspecified: Secondary | ICD-10-CM

## 2022-06-22 DIAGNOSIS — M7061 Trochanteric bursitis, right hip: Secondary | ICD-10-CM

## 2022-06-22 LAB — BASIC METABOLIC PANEL
Anion gap: 6 (ref 5–15)
BUN: 7 mg/dL — ABNORMAL LOW (ref 8–23)
CO2: 29 mmol/L (ref 22–32)
Calcium: 8.6 mg/dL — ABNORMAL LOW (ref 8.9–10.3)
Chloride: 103 mmol/L (ref 98–111)
Creatinine, Ser: 0.6 mg/dL (ref 0.44–1.00)
GFR, Estimated: >60 mL/min (ref >60)
Glucose, Bld: 87 mg/dL (ref 70–99)
Potassium: 4.3 mmol/L (ref 3.5–5.1)
Sodium: 138 mmol/L (ref 135–145)

## 2022-06-22 LAB — CBC WITH DIFFERENTIAL/PLATELET
Abs Immature Granulocytes: 0.05 10*3/uL (ref 0.00–0.07)
Basophils Absolute: 0.1 10*3/uL (ref 0.0–0.1)
Basophils Relative: 1 %
Eosinophils Absolute: 0.4 10*3/uL (ref 0.0–0.5)
Eosinophils Relative: 7 %
HCT: 37.5 % (ref 36.0–46.0)
Hemoglobin: 11.5 g/dL — ABNORMAL LOW (ref 12.0–15.0)
Immature Granulocytes: 1 %
Lymphocytes Relative: 30 %
Lymphs Abs: 1.7 10*3/uL (ref 0.7–4.0)
MCH: 30.8 pg (ref 26.0–34.0)
MCHC: 30.7 g/dL (ref 30.0–36.0)
MCV: 100.5 fL — ABNORMAL HIGH (ref 80.0–100.0)
Monocytes Absolute: 0.5 10*3/uL (ref 0.1–1.0)
Monocytes Relative: 10 %
Neutro Abs: 2.9 10*3/uL (ref 1.7–7.7)
Neutrophils Relative %: 51 %
Platelets: 266 10*3/uL (ref 150–400)
RBC: 3.73 MIL/uL — ABNORMAL LOW (ref 3.87–5.11)
RDW: 19.9 % — ABNORMAL HIGH (ref 11.5–15.5)
WBC: 5.7 10*3/uL (ref 4.0–10.5)
nRBC: 0 % (ref 0.0–0.2)

## 2022-06-22 MED ORDER — DIPHENHYDRAMINE-ZINC ACETATE 2-0.1 % EX CREA
TOPICAL_CREAM | Freq: Two times a day (BID) | CUTANEOUS | Status: DC | PRN
  Filled 2022-06-22: qty 28

## 2022-06-22 MED ORDER — POLYVINYL ALCOHOL 1.4 % OP SOLN: 1.0000 [drp] | OPHTHALMIC | Status: DC | PRN

## 2022-06-22 MED ORDER — METHOCARBAMOL 500 MG PO TABS
500.0000 mg | ORAL_TABLET | Freq: Four times a day (QID) | ORAL | Status: DC | PRN
  Administered 2022-06-22 – 2022-06-30 (×15): 1000 mg via ORAL
  Administered 2022-07-01: 500 mg via ORAL
  Filled 2022-06-22 (×5): qty 2
  Filled 2022-06-22: qty 1
  Filled 2022-06-22 (×10): qty 2

## 2022-06-22 MED ORDER — LIDOCAINE 5 % EX PTCH: 1.0000 | MEDICATED_PATCH | CUTANEOUS | Status: DC

## 2022-06-22 MED ORDER — CAPSAICIN 0.025 % EX CREA: TOPICAL_CREAM | Freq: Two times a day (BID) | CUTANEOUS | Status: DC

## 2022-06-22 MED ORDER — ACETAMINOPHEN 325 MG PO TABS
650.0000 mg | ORAL_TABLET | ORAL | Status: DC
  Administered 2022-06-22 – 2022-07-01 (×38): 650 mg via ORAL
  Filled 2022-06-22 (×39): qty 2

## 2022-06-22 MED ORDER — CAPSAICIN 0.025 % EX CREA
TOPICAL_CREAM | Freq: Two times a day (BID) | CUTANEOUS | Status: DC
  Filled 2022-06-22: qty 60

## 2022-06-22 NOTE — TOC Initial Note (Signed)
Transition of Care [facility]) - Initial/Assessment Note    Patient Details  Name: [name] MRN: [id number] Date of Birth: 11/10/1940  [facility]) CM/SW Contact:    Leeroy Cha, RN Phone Number: 06/22/2022, 8:03 AM  Clinical Narrative:                  Transition of Care [facility]) Screening Note   Patient Details  Name: [name] Date of Birth: [birth year]   Transition of Care [facility]) CM/SW Contact:    Leeroy Cha, RN Phone Number: 06/22/2022, 8:03 AM    Transition of Care Department (TOC) has reviewed patient and no TOC needs have been identified at this time. We will continue to monitor patient advancement through interdisciplinary progression rounds. If new patient transition needs arise, please place a TOC consult.    Expected Discharge Plan: Home/Self Care Barriers to Discharge: Continued Medical Work up   Patient Goals and CMS Choice Patient states their goals for this hospitalization and ongoing recovery are:: to go home CMS Medicare.gov Compare Post Acute Care list provided to:: Patient Choice offered to / list presented to : Patient      Expected Discharge Plan and Services   Discharge Planning Services: CM Consult   Living arrangements for the past 2 months: Single Family Home                                      Prior Living Arrangements/Services Living arrangements for the past 2 months: Single Family Home Lives with:: Spouse Patient language and need for interpreter reviewed:: Yes Do you feel safe going back to the place where you live?: Yes            Criminal Activity/Legal Involvement Pertinent to Current Situation/Hospitalization: No - Comment as needed  Activities of Daily Living Home Assistive Devices/Equipment: None ADL Screening (condition at time of admission) Patient's cognitive ability adequate to safely complete daily activities?: Yes Is the patient deaf or have difficulty hearing?:  No Does the patient have difficulty seeing, even when wearing glasses/contacts?: No Does the patient have difficulty concentrating, remembering, or making decisions?: No Patient able to express need for assistance with ADLs?: Yes Does the patient have difficulty dressing or bathing?: Yes Independently performs ADLs?: Yes (appropriate for developmental age) Does the patient have difficulty walking or climbing stairs?: Yes Weakness of Legs: Both Weakness of Arms/Hands: None  Permission Sought/Granted                  Emotional Assessment Appearance:: Appears stated age Attitude/Demeanor/Rapport: Gracious Affect (typically observed): Calm Orientation: : Oriented to Self, Oriented to Place, Oriented to  Time, Oriented to Situation Alcohol / Substance Use: Tobacco Use, Alcohol Use (quit smoking 24 years ago, current occassional eoth intake) Psych Involvement: No (comment)  Admission diagnosis:  Intractable back pain [M54.9] Closed compression fracture of L2 lumbar vertebra, initial encounter ([place]) [S32.020A] Closed compression fracture of L3 lumbar vertebra with delayed healing, subsequent encounter [S32.030G] Patient Active Problem List   Diagnosis Date Noted   Closed compression fracture of L2 lumbar vertebra, initial encounter ([place]) 06/21/2022   Intractable back pain 06/20/2022   High risk medication use 02/18/2019   Therapeutic drug monitoring 02/18/2019   Healthcare maintenance 02/18/2019   ILD (interstitial lung disease) ([place]) 11/28/2016   Multiple lung nodules on CT 11/28/2016   Abnormal laboratory test 11/18/2016   DDD L  spine 11/18/2016   Scl-70 antibody positive 11/10/2016   History of hypothyroidism 11/10/2016   History of hemochromatosis 11/10/2016   Pulmonary emphysema ([place]) 08/31/2016   [name] phenomenon without gangrene 08/31/2016   Sinusitis, chronic 08/31/2016   Chronic cough 02/10/2016   Irritable larynx 02/10/2016   Stopped smoking with greater than 40  pack year history 02/10/2016   Chest pain, unspecified 10/06/2010   Shortness of breath 10/06/2010   Hypothyroidism    Hereditary hemochromatosis ([place]) 04/22/2010   ATRIAL FIBRILLATION 10/20/2008   PAC 10/20/2008   SYNCOPE AND COLLAPSE 10/20/2008   PCP:  Kathyrn Lass, MD Pharmacy:   [place], [place] - [place] AT [address] [place] [address] Phone: [phone] Fax: [phone]  [facility] ([place], [place] [facility] [address] [place] [address] [place] [address] Phone: [phone] Fax: [phone]     Social Determinants of Health (SDOH) Social History: [place]: No Food Insecurity (06/20/2022)  Housing: Low Risk  (06/20/2022)  Transportation Needs: No Transportation Needs (06/20/2022)  Utilities: Not At Risk (06/20/2022)  Depression (PHQ2-9): Low Risk  (08/21/2018)  Tobacco Use: Medium Risk (06/21/2022)   SDOH Interventions:     Readmission Risk Interventions   No data to display

## 2022-06-22 NOTE — Progress Notes (Signed)
[facility]                                                                               Lidiya Reise, is a 81 y.o. female, DOB - 07-21-1940, MGQ:[id number] Admit date - 06/20/2022    Outpatient Primary MD for the patient is Kathyrn Lass, MD  LOS - 1  days    Brief summary   81 y.o. female  chronic atrial fibrillation, history of chest pain with negative Myoview stress test, depression with anxiety, GERD, hemochromatosis, iron deficiency anemia, history of syncopal episode, hypothyroidism who presented to the emergency department complaints of right-sided lower back abdominal pain for the past 6 days that was initially under control after she went to the urgent care was given analgesics and muscle relaxants. Hold however subsequently over the following subsequent days with severe pain and presented to ED  CT renal study show acute mild compression fracture. Chronic mild compression fracture of L3. No acute of the abdomen or pelvis. CTA chest/abdomen/pelvis no thoracoabdominal aortic dissection or aneurysm. There is mild aortic atherosclerosis. Aortic branch vessels widely patent. L2 fracture is also seen    Assessment & Plan    Assessment and Plan:   Intractable back pain secondary to L2 compression fracture Pain control with IV Toradol and oral oxycodone, Increase Tylenol to scheduled to ensure some medication for pain is in system Lidocaine and effective adding capsaicin to lower back Continue Robaxin but increase to [phone] every 6 as needed Kyphoplasty is ordered for next week-if she is able to resolve without inpatient need for the same we can discharge her home-have therapy see her tomorrow to assess  Chronic atrial fibrillation Continue with flecainide 100 mg twice daily and Eliquis for anticoagulation. Continue with metoprolol. We will consider discontinuation of Eliquis 3 to 4 days prior to procedure  History of emphysema and pulmonary  fibrosis Nasal cannula oxygen as needed  History of hypothyroidism Continue with Synthroid 112 mcg daily.  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   Weight as of this encounter: 64.5 kg.  Code Status: Full code DVT Prophylaxis:   apixaban (ELIQUIS) tablet 5 mg   Level of Care: Level of care: Med-Surg Family Communication: Family at bedside  Disposition Plan:     Remains inpatient appropriate:  appropriate pain control.   Procedures:  None  Consultants: IR consult   Antimicrobials:   Anti-infectives (From admission, onward)    Start     Dose/Rate Route Frequency Ordered Stop   06/21/22 0000  sulfamethoxazole-trimethoprim (BACTRIM DS) 800-160 MG per tablet 1 tablet        1 tablet Oral Once per day on Mon Wed Fri 06/20/22 1815     06/20/22 2200  amoxicillin-clavulanate (AUGMENTIN) 875-125 MG per tablet 1 tablet        1 tablet Oral Every 12 hours 06/20/22 1815 06/21/22 1002        Medications  Scheduled  Meds:  acetaminophen  650 mg Oral Q4H   apixaban  5 mg Oral BID   atorvastatin  10 mg Oral Daily   capsaicin   Topical BID   cholecalciferol  1,000 Units Oral Daily   ferrous sulfate  325 mg Oral Q breakfast   flecainide  100 mg Oral BID   folic acid  1 mg Oral Daily   levothyroxine  112 mcg Oral Daily   metoprolol succinate  25 mg Oral Daily   mycophenolate  360 mg Oral BID   pantoprazole  20 mg Oral BID   senna-docusate  2 tablet Oral BID   sulfamethoxazole-trimethoprim  1 tablet Oral Once per day on Mon Wed Fri   umeclidinium bromide  1 puff Inhalation Daily   Continuous Infusions: PRN Meds:.alum & mag hydroxide-simeth **AND** lidocaine, bisacodyl, methocarbamol, ondansetron **OR** ondansetron  (ZOFRAN) IV, oxyCODONE, polyvinyl alcohol    Subjective:   Fair No distress no fever no chills no nausea no vomiting  Objective:   Vitals:   06/21/22 2111 06/22/22 0452 06/22/22 0758 06/22/22 1428  BP: (!) 109/59 (!) 102/59  124/61  Pulse: (!) 59 65  60  Resp:  16  (!) 21  Temp: 97.8 F (36.6 C) 98.2 F (36.8 C)  98 F (36.7 C)  TempSrc: Oral Oral  Oral  SpO2: 95% 94% 95% 97%  Weight:      Height:        Intake/Output Summary (Last 24 hours) at 06/22/2022 1615 Last data filed at 06/22/2022 1204 Gross per 24 hour  Intake 360 ml  Output 1200 ml  Net -840 ml    Filed Weights   06/20/22 0946 06/20/22 2300  Weight: 62.6 kg 64.5 kg     Exam  EOMI NCAT no focal deficit no icterus no pallor looks about stated age CTA B no added sound ROM intact no focal deficit no rales no rhonchi no wheeze S1-S2 seems to be in sinus rhythm Abdomen soft no rebound no guarding    Data Reviewed:  I have personally reviewed following labs and imaging studies   CBC Lab Results  Component Value Date   WBC 5.7 06/22/2022   RBC 3.73 (L) 06/22/2022   HGB 11.5 (L) 06/22/2022   HCT 37.5 06/22/2022   MCV 100.5 (H) 06/22/2022   MCH 30.8 06/22/2022   PLT 266 06/22/2022   MCHC 30.7 06/22/2022   RDW 19.9 (H) 06/22/2022   LYMPHSABS 1.7 06/22/2022   MONOABS 0.5 06/22/2022   EOSABS 0.4 06/22/2022   BASOSABS 0.1 00/93/8182     Last metabolic panel Lab Results  Component Value Date   NA 138 06/22/2022   K 4.3 06/22/2022   CL 103 06/22/2022   CO2 29 06/22/2022   BUN 7 (L) 06/22/2022   CREATININE 0.60 06/22/2022   GLUCOSE 87 06/22/2022   GFRNONAA >60 06/22/2022   GFRAA 101 06/09/2020   CALCIUM 8.6 (L) 06/22/2022   PHOS 3.1 05/20/2013   PROT 5.2 (L) 06/21/2022   ALBUMIN 3.0 (L) 06/21/2022   LABGLOB 2.4 10/10/2016   AGRATIO 1.8 10/10/2016   BILITOT 0.4 06/21/2022   ALKPHOS 53 06/21/2022   AST 12 (L) 06/21/2022   ALT 10 06/21/2022   ANIONGAP 6 06/22/2022    CBG (last 3)   No results for input(s): "GLUCAP" in the last 72 hours.    Coagulation Profile: No results for input(s): "INR", "PROTIME" in the last 168 hours.   Radiology Studies: MR LUMBAR SPINE WO CONTRAST  Result Date:  06/22/2022 CLINICAL DATA:  Initial evaluation for compression fracture. EXAM: MRI LUMBAR SPINE WITHOUT CONTRAST TECHNIQUE: Multiplanar, multisequence MR imaging of the lumbar spine was performed. No intravenous contrast was administered. COMPARISON:  Prior CT from 06/20/2022. FINDINGS: Segmentation: Standard. Lowest well-formed disc space labeled the L5-S1 level. Alignment: Trace 3 mm degenerative anterolisthesis of L4 on L5. Associated trace retrolisthesis of L3 on L4. Alignment otherwise normal preservation of the normal lumbar lordosis. Underlying mild levoscoliosis. Vertebrae: Acute compression fracture involving the superior endplate of L2. Associated height loss measures up to 25% no more than trace 2 mm bony retropulsion. No significant stenosis. Additional mild chronic compression deformity of L3 noted. Vertebral body height otherwise maintained. Bone marrow signal intensity heterogeneous but overall within normal limits. No worrisome osseous lesions. No other abnormal marrow edema. Conus medullaris and cauda equina: Conus extends to the L1 level. Conus and cauda equina appear normal. Paraspinal and other soft tissues: Unremarkable. Disc levels: L1-2: Mild disc bulge, eccentric to the left. Trace 2 mm bony retropulsion related to the L2 compression fracture. Mild facet spurring. No spinal stenosis. Foramina remain patent. L2-3: Mild circumferential disc bulge with disc desiccation. Superimposed small right foraminal disc protrusion closely approximates the exiting right L2 nerve root without frank impingement (series 5, image 5). Mild facet hypertrophy. No spinal stenosis. Foramina remain patent. L3-4: Disc desiccation with mild disc bulge, asymmetric to the left. Associated central and left  extraforaminal annular fissures. Mild facet hypertrophy. No significant spinal stenosis. Foramina remain patent. L4-5: Trace anterolisthesis. Disc desiccation with mild disc bulge. Moderate right with mild left facet hypertrophy. No significant spinal stenosis. Foramina remain patent. L5-S1: Disc desiccation with mild disc bulge. Superimposed left foraminal disc protrusion closely approximates the exiting left L5 nerve root (series 7, image 12). Moderate facet hypertrophy. No spinal stenosis. Mild left L5 foraminal narrowing. Right neural foramen remains patent. IMPRESSION: 1. Acute compression fracture involving the superior endplate of L2 with up to 25% height loss and trace 2 mm bony retropulsion. No significant stenosis. 2. Mild chronic L3 compression fracture without stenosis. 3. Small right foraminal disc protrusion at L2-3, closely approximating and potentially irritating the exiting right L2 nerve root. 4. Left foraminal disc protrusion at L5-S1, potentially affecting the exiting left L5 nerve root. 5. Moderate bilateral facet hypertrophy at L4-5 and L5-S1. Electronically Signed   By: Jeannine Boga M.D.   On: 06/22/2022 00:19       Nita Sells M.D. [facility] 06/22/2022, 4:15 PM  Available via Epic secure chat 7am-7pm After 7 pm, please refer to night coverage provider listed on amion.

## 2022-06-22 NOTE — Progress Notes (Signed)
Patient ID: [name], female   DOB: Apr 24, 1941, 81 y.o.   MRN: [id number] Asked to eval pt for poss L2 KP; she was recently admitted with mid to low back pain with radiation to rt groin region; referred groin pain is currently limiting pt mobility; latest MRI L spine revealed:   1. Acute compression fracture involving the superior endplate of L2 with up to 25% height loss and trace 2 mm bony retropulsion. No significant stenosis. 2. Mild chronic L3 compression fracture without stenosis. 3. Small right foraminal disc protrusion at L2-3, closely approximating and potentially irritating the exiting right L2 nerve root. 4. Left foraminal disc protrusion at L5-S1, potentially affecting the exiting left L5 nerve root. 5. Moderate bilateral facet hypertrophy at L4-5 and L5-S1  Images were reviewed by Dr. Maryelizabeth Kaufmann; pt does have some point tenderness at L2 level and groin pain follows L2 dermatome; she would be candidate for L2 KP; details/risks of procedure d/w pt/daughter, incl but not limited to, internal bleeding, infection, nerve injury, inability to eradicate pain.They would like to proceed with KP.  We are currently awaiting insurance preauthorization for procedure; once procedure approved will move forward with scheduling case, earliest opening would potentially be next week. Pt would need to be off eliquis for 2 days before procedure. Above d/w pt/daughter/TRH. Please contact IR at [phone] if pt dc'd home before next week. Additional workup will be done once approval obtained.

## 2022-06-23 DIAGNOSIS — M549 Dorsalgia, unspecified: Secondary | ICD-10-CM | POA: Diagnosis not present

## 2022-06-23 NOTE — Evaluation (Addendum)
Physical Therapy Evaluation Patient Details Name: [name] MRN: [id number] DOB: August 28, 1940 Today's Date: 06/23/2022  History of Present Illness  Pt is an 81yo female presenting to [facility] ED with R lower back and abdominal pain; Imaging showed chronic mild L3 compression fx with acute L2 compression fx. PMH: afib, chest pain, GERD, hypothyroidism.   Clinical Impression  Pt presents with the problems listed above and functional impairments below. Pt demonstrated bed mobility logroll technique with good form, supervision for safety only. Educated pt and daughter on TSLO donning and wear and answered all questions. Pt required min assist for transfers, adjusted TSLO in standing. Pt required min guard for short-distance ambulation in room, distance limited by pain and fatigue. Pt in recliner at end of session, repositioned for comfort. Pt reports she is not functioning at her baseline, is weaker, and is not ambulating well. Emphasized use of RW during acute stay and upon discharge for increased stability. Discharge destination is difficult to recommend at this time as pt may undergo spinal surgery; current recommendation is SNF-level therapies vs HHPT dependent on pt status and family's ability to provide 24/7 assist post-acutely. We will continue to follow acutely.      Recommendations for follow up therapy are one component of a multi-disciplinary discharge planning process, led by the attending physician.  Recommendations may be updated based on patient status, additional functional criteria and insurance authorization.  Follow Up Recommendations Skilled nursing-short term rehab (<3 hours/day) (vs. HHPT depending on whether or not pt has possible lumbar surgery and level of assist available from family.) Can patient physically be transported by private vehicle: Yes    Assistance Recommended at Discharge Frequent or constant Supervision/Assistance  Patient can return home with the following  A  little help with walking and/or transfers;A little help with bathing/dressing/bathroom;Assistance with cooking/housework;Assist for transportation;Help with stairs or ramp for entrance    Equipment Recommendations None recommended by PT (TBD)  Recommendations for Other Services       Functional Status Assessment Patient has had a recent decline in their functional status and demonstrates the ability to make significant improvements in function in a reasonable and predictable amount of time.     Precautions / Restrictions Precautions Precautions: Back Precaution Booklet Issued: No Precaution Comments: no BLT, use TSLO when out of bed, logroll Required Braces or Orthoses: Spinal Brace Spinal Brace: Thoracolumbosacral orthotic Restrictions Weight Bearing Restrictions: No      Mobility  Bed Mobility Overal bed mobility: Needs Assistance Bed Mobility: Rolling, Sidelying to Sit Rolling: Supervision Sidelying to sit: Supervision, HOB elevated       General bed mobility comments: Pt able to demonstrate logrolling technique with very good form, supervision for safety only. TSLO donned in sitting.    Transfers Overall transfer level: Needs assistance Equipment used: 1 person hand held assist Transfers: Sit to/from Stand Sit to Stand: Min assist           General transfer comment: BUE support on PT for steadying (min assist at most). Adjusted TSLO fit in standing.    Ambulation/Gait Ambulation/Gait assistance: Min guard Gait Distance (Feet): 10 Feet Assistive device: 1 person hand held assist Gait Pattern/deviations: Step-to pattern, Shuffle, Drifts right/left, Narrow base of support Gait velocity: decreased     General Gait Details: Pt ambulated with single hand-held assist with reduced arm swing, shuffling steps, and narrow BOS, reporting some unsteadiness and weakness. Directed pt to sit in recliner, further mobility deferred  Stairs  Wheelchair  Mobility    Modified Rankin (Stroke Patients Only)       Balance Overall balance assessment: Needs assistance Sitting-balance support: Feet supported, No upper extremity supported Sitting balance-Leahy Scale: Good     Standing balance support: During functional activity, Reliant on assistive device for balance, Single extremity supported Standing balance-Leahy Scale: Fair                               Pertinent Vitals/Pain Pain Assessment Pain Assessment: 0-10 Pain Score: 7  Pain Location: low back Pain Descriptors / Indicators: Discomfort, Grimacing, Radiating Pain Intervention(s): Limited activity within patient's tolerance, Monitored during session, Repositioned    Home Living Family/patient expects to be discharged to:: Private residence Living Arrangements: Children;Other relatives Available Help at Discharge: Family;Available 24 hours/day Type of Home: House Home Access: Stairs to enter Entrance Stairs-Rails: Left Entrance Stairs-Number of Steps: 2   Home Layout: Able to live on main level with bedroom/bathroom Home Equipment: Conservation officer, nature (2 wheels)      Prior Function Prior Level of Function : Independent/Modified Independent             Mobility Comments: IND ADLs Comments: IND     Hand Dominance        Extremity/Trunk Assessment   Upper Extremity Assessment Upper Extremity Assessment: Overall WFL for tasks assessed    Lower Extremity Assessment Lower Extremity Assessment: Overall WFL for tasks assessed    Cervical / Trunk Assessment Cervical / Trunk Assessment: Kyphotic  Communication   Communication: No difficulties  Cognition Arousal/Alertness: Awake/alert Behavior During Therapy: WFL for tasks assessed/performed Overall Cognitive Status: Within Functional Limits for tasks assessed                                          General Comments General comments (skin integrity, edema, etc.): Daughter  present    Exercises     Assessment/Plan    PT Assessment Patient needs continued PT services  PT Problem List Decreased strength;Decreased range of motion;Decreased activity tolerance;Decreased balance;Decreased mobility;Decreased coordination;Pain       PT Treatment Interventions DME instruction;Gait training;Stair training;Functional mobility training;Therapeutic activities;Therapeutic exercise;Balance training;Neuromuscular re-education;Patient/family education    PT Goals (Current goals can be found in the Care Plan section)  Acute Rehab PT Goals Patient Stated Goal: To reduce pain PT Goal Formulation: With patient Time For Goal Achievement: 07/07/22 Potential to Achieve Goals: Good Additional Goals Additional Goal #1: Pt will demonstrate ability to don/doff TSLO with at most supervision level of assist.    Frequency Min 3X/week     Co-evaluation               AM-PAC PT "6 Clicks" Mobility  Outcome Measure Help needed turning from your back to your side while in a flat bed without using bedrails?: None Help needed moving from lying on your back to sitting on the side of a flat bed without using bedrails?: A Little Help needed moving to and from a bed to a chair (including a wheelchair)?: A Little Help needed standing up from a chair using your arms (e.g., wheelchair or bedside chair)?: A Little Help needed to walk in hospital room?: A Little Help needed climbing 3-5 steps with a railing? : A Little 6 Click Score: 19    End of Session Equipment Utilized During Treatment: Back brace  Activity Tolerance: Patient limited by pain Patient left: in chair;with call bell/phone within reach;with chair alarm set;with family/visitor present Nurse Communication: Mobility status PT Visit Diagnosis: Pain;Difficulty in walking, not elsewhere classified (R26.2);Muscle weakness (generalized) (M62.81) Pain - Right/Left: Right Pain - part of body: Hip (lumbar spine radiating into  R hip)    Time: 1638-4665 PT Time Calculation (min) (ACUTE ONLY): 27 min   Charges:   PT Evaluation $PT Eval Low Complexity: 1 Low PT Treatments $Gait Training: 8-22 mins        Coolidge Breeze, PT, DPT [facility] Department Office: [phone] Weekend pager: [phone]  Coolidge Breeze 06/23/2022, 5:56 PM

## 2022-06-23 NOTE — Progress Notes (Signed)
[facility]                                                                               [name], is a 81 y.o. female, DOB - May 15, 1941, ZOX:[id number] Admit date - 06/20/2022    Outpatient Primary MD for the patient is Kathyrn Lass, MD  LOS - 2  days    Brief summary   81 y.o. female  chronic atrial fibrillation, history of chest pain with negative Myoview stress test, depression with anxiety, GERD, hemochromatosis, iron deficiency anemia, history of syncopal episode, hypothyroidism who presented to the emergency department complaints of right-sided lower back abdominal pain for the past 6 days that was initially under control after she went to the urgent care was given analgesics and muscle relaxants. Hold however subsequently over the following subsequent days with severe pain and presented to ED  CT renal study show acute mild compression fracture. Chronic mild compression fracture of L3. No acute of the abdomen or pelvis. CTA chest/abdomen/pelvis no thoracoabdominal aortic dissection or aneurysm. There is mild aortic atherosclerosis. Aortic branch vessels widely patent. L2 fracture is also seen    Assessment & Plan    Assessment and Plan:   Intractable back pain secondary to L2 compression fracture Pain control with IV Toradol and oral oxycodone, Increase Tylenol to scheduled to ensure some medication for pain is in system--will attempt to get patient up to chair again today Lidocaine ineffective-she did have some benefit with adding capsaicin to lower back Continue Robaxin but increase to [phone] every 6 as needed Kyphoplasty is ordered for next week-I will hold Eliquis from tonight as I feel she may need the kyphoplasty  Chronic atrial fibrillation Continue with flecainide 100 mg twice daily  Continue with metoprolol. Eliquis has been held in preparation for kyphoplasty if needed  History of emphysema and pulmonary fibrosis Nasal cannula oxygen  as needed  History of hypothyroidism Continue with Synthroid 112 mcg daily.  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   Weight as of this encounter: 64.5 kg.  Code Status: Full code DVT Prophylaxis:   apixaban (ELIQUIS) tablet 5 mg   Level of Care: Level of care: Med-Surg Family Communication: Family at bedside  Disposition Plan:     Remains inpatient appropriate:  appropriate pain control.   Procedures:  None  Consultants: IR consult   Antimicrobials:   Anti-infectives (From admission, onward)    Start     Dose/Rate Route Frequency Ordered Stop   06/21/22 0000  sulfamethoxazole-trimethoprim (BACTRIM DS) 800-160 MG per tablet 1 tablet        1 tablet Oral Once per day on Mon Wed Fri 06/20/22 1815     06/20/22 2200  amoxicillin-clavulanate (AUGMENTIN) 875-125 MG per tablet 1 tablet        1 tablet Oral Every 12 hours 06/20/22 1815 06/21/22 1002        Medications  Scheduled Meds:  acetaminophen  650 mg Oral Q4H   apixaban  5 mg Oral BID   atorvastatin  10 mg Oral Daily   capsaicin   Topical BID   cholecalciferol  1,000 Units Oral Daily   ferrous sulfate  325 mg Oral Q breakfast   flecainide  100 mg Oral BID   folic acid  1 mg Oral Daily   levothyroxine  112 mcg Oral Daily   metoprolol succinate  25 mg Oral Daily   mycophenolate  360 mg Oral BID   pantoprazole  20 mg Oral BID   senna-docusate  2 tablet Oral BID   sulfamethoxazole-trimethoprim  1 tablet Oral Once per day on Mon Wed Fri   umeclidinium bromide  1 puff Inhalation Daily   Continuous Infusions: PRN Meds:.alum & mag hydroxide-simeth **AND** lidocaine, bisacodyl, diphenhydrAMINE-zinc acetate, methocarbamol, ondansetron **OR**  ondansetron (ZOFRAN) IV, oxyCODONE, polyvinyl alcohol    Subjective:   Coherent awake no distress no fever no chills no nausea no vomiting Objective:   Vitals:   06/22/22 0758 06/22/22 1428 06/22/22 2107 06/23/22 0549  BP:  124/61 (!) 116/58 (!) 103/58  Pulse:  60 63 (!) 57  Resp:  (!) '21 16 18  '$ Temp:  98 F (36.7 C) 97.9 F (36.6 C) 97.8 F (36.6 C)  TempSrc:  Oral Oral Oral  SpO2: 95% 97% 95% 92%  Weight:      Height:        Intake/Output Summary (Last 24 hours) at 06/23/2022 1333 Last data filed at 06/23/2022 0957 Gross per 24 hour  Intake 120 ml  Output 1050 ml  Net -930 ml    Filed Weights   06/20/22 0946 06/20/22 2300  Weight: 62.6 kg 64.5 kg     Exam  Scleral injection seems improved CTA B no added sound no rales no rhonchi no wheeze S1-S2 no murmur ROM intact moving 4 limbs equally able to straight leg raise bilaterally without pain Able to sit up without any truncal discomfort or paraspinal pain Rash on the back seems improved     Data Reviewed:  I have personally reviewed following labs and imaging studies   CBC Lab Results  Component Value Date   WBC 5.7 06/22/2022   RBC 3.73 (L) 06/22/2022   HGB 11.5 (L) 06/22/2022   HCT 37.5 06/22/2022   MCV 100.5 (H) 06/22/2022   MCH 30.8 06/22/2022   PLT 266 06/22/2022   MCHC 30.7 06/22/2022   RDW 19.9 (H) 06/22/2022   LYMPHSABS 1.7 06/22/2022   MONOABS 0.5 06/22/2022   EOSABS 0.4 06/22/2022   BASOSABS 0.1 67/89/3810     Last metabolic panel Lab Results  Component Value Date   NA 138 06/22/2022   K 4.3 06/22/2022   CL 103 06/22/2022   CO2 29 06/22/2022   BUN 7 (L) 06/22/2022   CREATININE 0.60 06/22/2022   GLUCOSE 87 06/22/2022   GFRNONAA >60 06/22/2022   GFRAA 101 06/09/2020   CALCIUM 8.6 (L) 06/22/2022   PHOS 3.1 05/20/2013   PROT 5.2 (L) 06/21/2022   ALBUMIN 3.0 (L) 06/21/2022   LABGLOB 2.4 10/10/2016   AGRATIO 1.8 10/10/2016   BILITOT 0.4 06/21/2022   ALKPHOS 53 06/21/2022    AST 12 (L) 06/21/2022   ALT 10 06/21/2022   ANIONGAP 6 06/22/2022    CBG (last 3)  No results for input(s): "GLUCAP" in the last 72 hours.    Coagulation Profile: No results for input(s): "INR", "PROTIME" in the last 168 hours.   Radiology Studies:  MR LUMBAR SPINE WO CONTRAST  Result Date: 06/22/2022 CLINICAL DATA:  Initial evaluation for compression fracture. EXAM: MRI LUMBAR SPINE WITHOUT CONTRAST TECHNIQUE: Multiplanar, multisequence MR imaging of the lumbar spine was performed. No intravenous contrast was administered. COMPARISON:  Prior CT from 06/20/2022. FINDINGS: Segmentation: Standard. Lowest well-formed disc space labeled the L5-S1 level. Alignment: Trace 3 mm degenerative anterolisthesis of L4 on L5. Associated trace retrolisthesis of L3 on L4. Alignment otherwise normal preservation of the normal lumbar lordosis. Underlying mild levoscoliosis. Vertebrae: Acute compression fracture involving the superior endplate of L2. Associated height loss measures up to 25% no more than trace 2 mm bony retropulsion. No significant stenosis. Additional mild chronic compression deformity of L3 noted. Vertebral body height otherwise maintained. Bone marrow signal intensity heterogeneous but overall within normal limits. No worrisome osseous lesions. No other abnormal marrow edema. Conus medullaris and cauda equina: Conus extends to the L1 level. Conus and cauda equina appear normal. Paraspinal and other soft tissues: Unremarkable. Disc levels: L1-2: Mild disc bulge, eccentric to the left. Trace 2 mm bony retropulsion related to the L2 compression fracture. Mild facet spurring. No spinal stenosis. Foramina remain patent. L2-3: Mild circumferential disc bulge with disc desiccation. Superimposed small right foraminal disc protrusion closely approximates the exiting right L2 nerve root without frank impingement (series 5, image 5). Mild facet hypertrophy. No spinal stenosis. Foramina remain patent. L3-4:  Disc desiccation with mild disc bulge, asymmetric to the left. Associated central and left extraforaminal annular fissures. Mild facet hypertrophy. No significant spinal stenosis. Foramina remain patent. L4-5: Trace anterolisthesis. Disc desiccation with mild disc bulge. Moderate right with mild left facet hypertrophy. No significant spinal stenosis. Foramina remain patent. L5-S1: Disc desiccation with mild disc bulge. Superimposed left foraminal disc protrusion closely approximates the exiting left L5 nerve root (series 7, image 12). Moderate facet hypertrophy. No spinal stenosis. Mild left L5 foraminal narrowing. Right neural foramen remains patent. IMPRESSION: 1. Acute compression fracture involving the superior endplate of L2 with up to 25% height loss and trace 2 mm bony retropulsion. No significant stenosis. 2. Mild chronic L3 compression fracture without stenosis. 3. Small right foraminal disc protrusion at L2-3, closely approximating and potentially irritating the exiting right L2 nerve root. 4. Left foraminal disc protrusion at L5-S1, potentially affecting the exiting left L5 nerve root. 5. Moderate bilateral facet hypertrophy at L4-5 and L5-S1. Electronically Signed   By: Jeannine Boga M.D.   On: 06/22/2022 00:19       Nita Sells M.D. [facility] 06/23/2022, 1:33 PM  Available via Epic secure chat 7am-7pm After 7 pm, please refer to night coverage provider listed on amion.

## 2022-06-24 ENCOUNTER — Telehealth: Payer: Self-pay | Admitting: Internal Medicine

## 2022-06-24 DIAGNOSIS — M549 Dorsalgia, unspecified: Secondary | ICD-10-CM | POA: Diagnosis not present

## 2022-06-24 MED ORDER — POLYETHYLENE GLYCOL 3350 17 G PO PACK
17.0000 g | PACK | Freq: Every day | ORAL | Status: DC
  Administered 2022-06-24 – 2022-06-26 (×3): 17 g via ORAL
  Filled 2022-06-24 (×3): qty 1

## 2022-06-24 MED ORDER — ENSURE ENLIVE PO LIQD
237.0000 mL | ORAL | Status: DC
  Administered 2022-06-25 – 2022-06-29 (×3): 237 mL via ORAL

## 2022-06-24 MED ORDER — APIXABAN 5 MG PO TABS
5.0000 mg | ORAL_TABLET | Freq: Two times a day (BID) | ORAL | Status: DC
  Administered 2022-06-24 – 2022-06-27 (×8): 5 mg via ORAL
  Filled 2022-06-24 (×8): qty 1

## 2022-06-24 MED ORDER — SENNOSIDES-DOCUSATE SODIUM 8.6-50 MG PO TABS
1.0000 | ORAL_TABLET | Freq: Every day | ORAL | Status: DC
  Administered 2022-06-26 – 2022-06-27 (×2): 1 via ORAL
  Filled 2022-06-24 (×4): qty 1

## 2022-06-24 NOTE — Plan of Care (Signed)
  Problem: Education: Goal: Knowledge of General Education information will improve Description: Including pain rating scale, medication(s)/side effects and non-pharmacologic comfort measures Outcome: Progressing   Problem: Health Behavior/Discharge Planning: Goal: Ability to manage health-related needs will improve Outcome: Progressing   Problem: Clinical Measurements: Goal: Ability to maintain clinical measurements within normal limits will improve Outcome: Progressing Goal: Will remain free from infection Outcome: Progressing Goal: Diagnostic test results will improve Outcome: Progressing Goal: Respiratory complications will improve Outcome: Progressing Goal: Cardiovascular complication will be avoided Outcome: Progressing   Problem: Activity: Goal: Risk for activity intolerance will decrease Outcome: Progressing   Problem: Nutrition: Goal: Adequate nutrition will be maintained Outcome: Progressing   Problem: Coping: Goal: Level of anxiety will decrease Outcome: Progressing   Problem: Elimination: Goal: Will not experience complications related to bowel motility Outcome: Progressing Goal: Will not experience complications related to urinary retention Outcome: Progressing   Problem: Pain Managment: Goal: General experience of comfort will improve Outcome: Progressing   Problem: Safety: Goal: Ability to remain free from injury will improve Outcome: Progressing   Problem: Skin Integrity: Goal: Risk for impaired skin integrity will decrease Outcome: Progressing   Patent is alert and oriented, resting in bed at time of entry. Call bell and personal belongings are within patients reach.

## 2022-06-24 NOTE — Telephone Encounter (Signed)
Patient is calling asking if the CT of her Chest can be canceled on 06/30/2021 due to her having CT scans done in hospital. But when I looked at chart it was CT for renal stones. L-spine and Angio chest. And I was not sure if this was comparable with what you needed. Im thinking that she is still needing to get her CT Chest done that you ordered to be done in January correct?  Please advise sir

## 2022-06-24 NOTE — Evaluation (Signed)
Occupational Therapy Evaluation Patient Details Name: [name] MRN: [id number] DOB: 03-Aug-1940 Today's Date: 06/24/2022   History of Present Illness Pt is an 81 yr old female presenting to [facility] ED with lower back and abdominal pain; Imaging showed chronic mild L3 compression fx with acute L2 compression fx. PMH: afib, chest pain, GERD, hypothyroidism.   Clinical Impression   The patient is currently presenting below her baseline level of functioning for self-care management, given new spinal precautions, low back pain, and slight unsteadiness in dynamic standing. She was educated on spinal precautions, positioning for improved comfort in bed, and compensatory strategies for managing self-care tasks. She will benefit from further OT services during her hospital stay, in order to maximize her independence with self-care tasks.      Recommendations for follow up therapy are one component of a multi-disciplinary discharge planning process, led by the attending physician.  Recommendations may be updated based on patient status, additional functional criteria and insurance authorization.   Follow Up Recommendations  Home health OT vs. No follow-up therapy needs, pending functional progress after possible kyphoplasty next week     Assistance Recommended at Discharge Set up Supervision/Assistance  Patient can return home with the following Assist for transportation;Assistance with cooking/housework    Functional Status Assessment  Patient has had a recent decline in their functional status and demonstrates the ability to make significant improvements in function in a reasonable and predictable amount of time.  Equipment Recommendations  None recommended by OT       Precautions / Restrictions Precautions Precautions: Back Required Braces or Orthoses: Spinal Brace Restrictions Weight Bearing Restrictions: No      Mobility Bed Mobility Overal bed mobility: Needs Assistance Bed  Mobility: Supine to Sit, Sit to Supine     Supine to sit: Supervision Sit to supine: Supervision        Transfers Overall transfer level: Needs assistance Equipment used: Rolling walker (2 wheels) Transfers: Sit to/from Stand Sit to Stand: Min guard                  Balance     Sitting balance-Leahy Scale: Good       Standing balance-Leahy Scale: Fair         ADL either performed or assessed with clinical judgement   ADL Overall ADL's : Needs assistance/impaired Eating/Feeding: Independent Eating/Feeding Details (indicate cue type and reason): based on clinical judgement Grooming: Set up;Min guard Grooming Details (indicate cue type and reason): Set-up seated or min guard standing         Upper Body Dressing : Moderate assistance;Minimal assistance Upper Body Dressing Details (indicate cue type and reason): She required min-mod assist to donn/doff her TLSO seated EOB Lower Body Dressing: Minimal assistance               Vision   Additional Comments: She correctly read the time depicted on the wall clock.            Pertinent Vitals/Pain Pain Assessment Pain Assessment: 0-10 Pain Score: 3  Pain Location: low back Pain Intervention(s): Limited activity within patient's tolerance, Other (comment) (She reported recently receiving pain medication.)     Hand Dominance Right   Extremity/Trunk Assessment Upper Extremity Assessment Upper Extremity Assessment: Overall WFL for tasks assessed   Lower Extremity Assessment Lower Extremity Assessment: Overall WFL for tasks assessed       Communication Communication Communication: No difficulties   Cognition Arousal/Alertness: Awake/alert Behavior During Therapy: WFL for tasks assessed/performed Overall  Cognitive Status: Within Functional Limits for tasks assessed          General Comments: Oriented x4, able to follow commands, friendly                Home Living Family/patient expects  to be discharged to:: Private residence Living Arrangements: Children;Other relatives Available Help at Discharge: Family Type of Home: House Home Access: Stairs to enter [employer] of Steps: 2 Entrance Stairs-Rails: Left Home Layout: Able to live on main level with bedroom/bathroom;Full bath on main level;Two level     Bathroom Shower/Tub: Walk-in shower         Home Equipment: Conservation officer, nature (2 wheels);Cane - single point;Crutches;Shower seat          Prior Functioning/Environment Prior Level of Function : Independent/Modified Independent             Mobility Comments: Independent with ambulation. ADLs Comments: She was independent with ADLs, cooking, and driving, she has hired assist twice per month for cleaning, and she drives.        OT Problem List: Impaired balance (sitting and/or standing);Pain;Decreased knowledge of use of DME or AE      OT Treatment/Interventions: Self-care/ADL training;Therapeutic exercise;Therapeutic activities;Energy conservation;DME and/or AE instruction;Patient/family education;Balance training    OT Goals(Current goals can be found in the care plan section) Acute Rehab OT Goals OT Goal Formulation: With patient Time For Goal Achievement: 07/08/22 Potential to Achieve Goals: Good  OT Frequency: Min 2X/week       AM-PAC OT "6 Clicks" Daily Activity     Outcome Measure Help from another person eating meals?: None Help from another person taking care of personal grooming?: None Help from another person toileting, which includes using toliet, bedpan, or urinal?: A Little Help from another person bathing (including washing, rinsing, drying)?: A Little Help from another person to put on and taking off regular upper body clothing?: A Little Help from another person to put on and taking off regular lower body clothing?: A Little 6 Click Score: 20   End of Session Equipment Utilized During Treatment: Rolling walker (2  wheels) Nurse Communication: Mobility status  Activity Tolerance: Patient tolerated treatment well Patient left: in bed;with call bell/phone within reach;with bed alarm set;with family/visitor present  OT Visit Diagnosis: Pain                Time: 9532-0233 OT Time Calculation (min): 20 min Charges:  OT General Charges $OT Visit: 1 Visit OT Evaluation $OT Eval Low Complexity: 1 Low    Anniya Whiters L Gerlene Glassburn, OTR/L 06/24/2022, 4:11 PM

## 2022-06-24 NOTE — TOC Progression Note (Signed)
Transition of Care [facility]) - Progression Note    Patient Details  Name: KEITA DEMARCO MRN: [id number] Date of Birth: [birth year]  [facility]) CM/SW Contact  Leeroy Cha, RN Phone Number: 06/24/2022, 9:58 AM  Clinical Narrative:    Fl2 done and sent out to [facility] for snf review and placement   Expected Discharge Plan: Home/Self Care Barriers to Discharge: Continued Medical Work up  Expected Discharge Plan and Services   Discharge Planning Services: CM Consult   Living arrangements for the past 2 months: [place] Determinants of Health (SDOH) Interventions SDOH Screenings   Food Insecurity: No Food Insecurity (06/20/2022)  Housing: Low Risk  (06/20/2022)  Transportation Needs: No Transportation Needs (06/20/2022)  Utilities: Not At Risk (06/20/2022)  Depression (PHQ2-9): Low Risk  (08/21/2018)  Tobacco Use: Medium Risk (06/21/2022)    Readmission Risk Interventions   No data to display

## 2022-06-24 NOTE — Progress Notes (Signed)
[facility]                                                                               [name], is a 81 y.o. female, DOB - 1941-02-26, XAJ:[id number] Admit date - 06/20/2022    Outpatient Primary MD for the patient is Kathyrn Lass, MD  LOS - 3  days    Brief summary   81 y.o. female  chronic atrial fibrillation, history of chest pain with negative Myoview stress test, depression with anxiety, GERD, hemochromatosis, iron deficiency anemia, history of syncopal episode, hypothyroidism who presented to the emergency department complaints of right-sided lower back abdominal pain for the past 6 days that was initially under control after she went to the urgent care was given analgesics and muscle relaxants. Hold however subsequently over the following subsequent days with severe pain and presented to ED  CT renal study show acute mild compression fracture. Chronic mild compression fracture of L3. No acute of the abdomen or pelvis. CTA chest/abdomen/pelvis no thoracoabdominal aortic dissection or aneurysm. There is mild aortic atherosclerosis. Aortic branch vessels widely patent. L2 fracture is also seen    Assessment & Plan    Assessment and Plan:   Intractable back pain secondary to L2 compression fracture Pain control with IV Toradol and oral oxycodone, Increase Tylenol to scheduled to ensure some medication for pain is in system--will attempt to get patient up to chair again today Lidocaine ineffective Continue Robaxin but increase to [phone] every 6 as needed Kyphoplasty is ordered for next week-resumed Eliquis as the earliest this would be done would be 06/30/2021 and patient's authorization for procedure still pending IR will communicate this with [name] would hold Eliquis from 1/2  Chronic atrial fibrillation Continue with flecainide 100 mg twice daily  Continue with metoprolol. Eliquis resumed 5 mg twice daily 12/29  History of emphysema and pulmonary  fibrosis Nasal cannula oxygen as needed  History of hypothyroidism Continue with Synthroid 112 mcg daily.  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   Weight as of this encounter: 64.5 kg.  Code Status: Full code DVT Prophylaxis:   apixaban (ELIQUIS) tablet 5 mg   Level of Care: Level of care: Med-Surg Family Communication: Family at bedside  Disposition Plan:     Remains inpatient appropriate:  appropriate pain control.   Procedures:  None  Consultants: IR consult   Antimicrobials:   Anti-infectives (From admission, onward)    Start     Dose/Rate Route Frequency Ordered Stop   06/21/22 0000  sulfamethoxazole-trimethoprim (BACTRIM DS) 800-160 MG per tablet 1 tablet        1 tablet Oral Once per day on Mon Wed Fri 06/20/22 1815     06/20/22 2200  amoxicillin-clavulanate (AUGMENTIN) 875-125 MG per tablet 1 tablet        1 tablet Oral Every 12 hours 06/20/22 1815 06/21/22 1002        Medications  Scheduled Meds:  acetaminophen  650 mg Oral Q4H   apixaban  5 mg Oral BID   atorvastatin  10 mg Oral Daily   capsaicin   Topical BID   cholecalciferol  1,000 Units Oral Daily   ferrous sulfate  325 mg Oral Q breakfast   flecainide  100 mg Oral BID   folic acid  1 mg Oral Daily   levothyroxine  112 mcg Oral Daily   metoprolol succinate  25 mg Oral Daily   mycophenolate  360 mg Oral BID   pantoprazole  20 mg Oral BID   polyethylene glycol  17 g Oral Daily   senna-docusate  1 tablet Oral QHS   sulfamethoxazole-trimethoprim  1 tablet Oral Once per day on Mon Wed Fri   umeclidinium bromide  1 puff Inhalation Daily   Continuous Infusions: PRN Meds:.alum & mag hydroxide-simeth **AND** lidocaine,  bisacodyl, diphenhydrAMINE-zinc acetate, methocarbamol, ondansetron **OR** ondansetron (ZOFRAN) IV, oxyCODONE, polyvinyl alcohol    Subjective:   Overall fair pain 6/10 about to be medicated no fever no chills no nausea no vomiting She seems comfortable overall but she is not having any stool and is a little constipated so we started her on MiraLAX  Objective:   Vitals:   06/23/22 2225 06/24/22 0605 06/24/22 0812 06/24/22 1246  BP: 139/75 (!) 118/53  130/65  Pulse: 62 62  62  Resp: '16 18  16  '$ Temp: 97.9 F (36.6 C) 98.2 F (36.8 C)  (!) 97.5 F (36.4 C)  TempSrc: Oral Oral  Oral  SpO2: 94% 99% 98% 98%  Weight:      Height:       No intake or output data in the 24 hours ending 06/24/22 1331  Filed Weights   06/20/22 0946 06/20/22 2300  Weight: 62.6 kg 64.5 kg     Exam  Sclera is clear CTA B no added sound no rales no rhonchi no wheeze S1-S2 no murmur sinus rhythm ROM intact moving 4 limbs equally able to straight leg raise bilaterally without pain Rash on the back improved     Data Reviewed:  I have personally reviewed following labs and imaging studies   CBC Lab Results  Component Value Date   WBC 5.7 06/22/2022   RBC 3.73 (L) 06/22/2022   HGB 11.5 (L) 06/22/2022   HCT 37.5 06/22/2022   MCV 100.5 (H) 06/22/2022   MCH 30.8 06/22/2022   PLT 266 06/22/2022   MCHC 30.7 06/22/2022   RDW 19.9 (H) 06/22/2022   LYMPHSABS 1.7 06/22/2022   MONOABS 0.5 06/22/2022   EOSABS 0.4 06/22/2022   BASOSABS 0.1 64/33/2951     Last metabolic panel Lab Results  Component Value Date   NA 138 06/22/2022   K 4.3 06/22/2022   CL 103 06/22/2022   CO2 29 06/22/2022   BUN 7 (L) 06/22/2022   CREATININE 0.60 06/22/2022   GLUCOSE 87 06/22/2022   GFRNONAA >60 06/22/2022   GFRAA 101 06/09/2020   CALCIUM 8.6 (L) 06/22/2022   PHOS 3.1 05/20/2013   PROT 5.2 (L) 06/21/2022   ALBUMIN 3.0 (L) 06/21/2022   LABGLOB 2.4 10/10/2016   AGRATIO 1.8 10/10/2016   BILITOT 0.4  06/21/2022   ALKPHOS 53 06/21/2022   AST 12 (L) 06/21/2022   ALT 10 06/21/2022   ANIONGAP 6 06/22/2022    CBG (last 3)  No results for input(s): "GLUCAP" in the last 72 hours.    Coagulation Profile: No results for input(s): "INR", "PROTIME" in the last 168  hours.   Radiology Studies: No results found.     Nita Sells M.D. [facility] 06/24/2022, 1:31 PM  Available via Epic secure chat 7am-7pm After 7 pm, please refer to night coverage provider listed on amion.

## 2022-06-24 NOTE — Plan of Care (Signed)

## 2022-06-24 NOTE — Progress Notes (Signed)
Initial Nutrition Assessment  DOCUMENTATION CODES:   Non-severe (moderate) malnutrition in context of chronic illness  INTERVENTION:  - Liberalize diet to Regular to avoid restricting intake.  - Ensure Enlive po once daily, provides 350 kcal and 20 grams of protein. - Encourage intake as tolerated. - Continue vitamin D, iron, and folic acid supplementation as medically appropriate. - Monitor weight trends.   NUTRITION DIAGNOSIS:   Moderate Malnutrition related to chronic illness as evidenced by mild fat depletion, moderate muscle depletion.  GOAL:   Patient will meet greater than or equal to 90% of their needs  MONITOR:   PO intake, Supplement acceptance, Weight trends  REASON FOR ASSESSMENT:   Malnutrition Screening Tool    ASSESSMENT:   81 y.o. female with PMH significant of chronic atrial fibrillation, depression with anxiety, GERD, hemochromatosis, iron deficiency anemia, history of syncopal episode, hypothyroidism who presented to the emergency department complaints of right-sided lower back. Found to have L2 compression fracture.   Patient resting in bed at time of visit, daughter at bedside.  Patient reports a UBW around 137# and denied changes in weight over the past year until the past week when she feels she may have lost a few pounds due to not eating well. Per EMR, no significant changes in weight over the past year.   Patient typically eats 3 meals plus a snack at home. Food recall as below: Breakfast: coffee with breakfast bar or muffin 10am snack: pretzels Lunch: ham or [place] sandwich on English muffin Dinner: varies, usually a protein and vegetables  Patient notes she typically has a good appetite but since developing the back pain her appetite has been decreased. She notes she hasn't been eating very well since admission due to dislike of available hospital food. Despite this, patient is documented to be consuming an average of 65% of meals.Thankfully,  daughter has been bringing in patient foods such as bagels, flatbreads, sandwiches to increase her intake.  Discussed importance of trying to eat well during admission to avoid weight loss. She is agreeable to try 1 Ensure daily to support nutrition needs.     Medications reviewed and include: 1000 units vitamin D, 325 mg iron, '1mg'$  folic acid, Synthroid, Miralax, Senokot  Labs reviewed:  None since 12/27   NUTRITION - FOCUSED PHYSICAL EXAM:  Flowsheet Row Most Recent Value  Orbital Region Mild depletion  Upper Arm Region Mild depletion  Thoracic and Lumbar Region Mild depletion  Buccal Region Mild depletion  Temple Region Moderate depletion  Clavicle Bone Region Mild depletion  Clavicle and Acromion Bone Region Mild depletion  Scapular Bone Region Unable to assess  Dorsal Hand Moderate depletion  Patellar Region Moderate depletion  Anterior Thigh Region Moderate depletion  Posterior Calf Region Moderate depletion  Edema (RD Assessment) None  Hair Reviewed  Eyes Reviewed  Mouth Reviewed  Skin Reviewed  Nails Reviewed       Diet Order:   Diet Order             Diet regular Room service appropriate? Yes; Fluid consistency: Thin  Diet effective now                   EDUCATION NEEDS:  Education needs have been addressed  Skin:  Skin Assessment: Reviewed RN Assessment  Last BM:  12/28  Height:  Ht Readings from Last 1 Encounters:  06/20/22 '5\' 3"'$  (1.6 m)   Weight:  Wt Readings from Last 1 Encounters:  06/20/22 64.5 kg  BMI:  Body mass index is 25.19 kg/m.  Estimated Nutritional Needs:  Kcal:  1600-1800 kcals Protein:  75-90 grams Fluid:  >/= 1.6L    Samson Frederic RD, LDN For contact information, refer to [facility].

## 2022-06-24 NOTE — NC FL2 (Signed)
[place] LEVEL OF CARE FORM     IDENTIFICATION  Patient Name: [name] Birthdate: 05/24/1941 Sex: female Admission Date (Current Location): 06/20/2022  [facility] and [place] Number:  Herbalist and Address:  [facility],  [place] [address] [place]      Provider Number: [id number]  Attending Physician Name and Address:  Nita Sells, MD  Relative Name and Phone Number:       Current Level of Care: Hospital Recommended Level of Care: [place] Prior Approval Number:    Date Approved/Denied:   PASRR Number: [id number]  Discharge Plan: SNF    Current Diagnoses: Patient Active Problem List   Diagnosis Date Noted   Closed compression fracture of L2 lumbar vertebra, initial encounter ([place]) 06/21/2022   Intractable back pain 06/20/2022   High risk medication use 02/18/2019   Therapeutic drug monitoring 02/18/2019   Healthcare maintenance 02/18/2019   ILD (interstitial lung disease) ([place]) 11/28/2016   Multiple lung nodules on CT 11/28/2016   Abnormal laboratory test 11/18/2016   DDD L spine 11/18/2016   Scl-70 antibody positive 11/10/2016   History of hypothyroidism 11/10/2016   History of hemochromatosis 11/10/2016   Pulmonary emphysema ([place]) 08/31/2016   [name] phenomenon without gangrene 08/31/2016   Sinusitis, chronic 08/31/2016   Chronic cough 02/10/2016   Irritable larynx 02/10/2016   Stopped smoking with greater than 40 pack year history 02/10/2016   Chest pain, unspecified 10/06/2010   Shortness of breath 10/06/2010   Hypothyroidism    Hereditary hemochromatosis ([place]) 04/22/2010   ATRIAL FIBRILLATION 10/20/2008   PAC 10/20/2008   SYNCOPE AND COLLAPSE 10/20/2008    Orientation RESPIRATION BLADDER Height & Weight     Self, Time, Situation, Place  Normal Continent Weight: 64.5 kg Height:  '5\' 3"'$  (160 cm)  BEHAVIORAL SYMPTOMS/MOOD NEUROLOGICAL BOWEL NUTRITION STATUS       Continent Diet (regular)  AMBULATORY STATUS COMMUNICATION OF NEEDS Skin   Extensive Assist Verbally Normal                       Personal Care Assistance Level of Assistance  Bathing, Feeding, Dressing Bathing Assistance: Limited assistance Feeding assistance: Limited assistance Dressing Assistance: Limited assistance     Functional Limitations Info  Sight, Hearing, Speech Sight Info: Adequate Hearing Info: Adequate Speech Info: Adequate    SPECIAL CARE FACTORS FREQUENCY  PT (By licensed PT), OT (By licensed OT)     PT Frequency: 5 x weekly OT Frequency: 5 x weekly            Contractures Contractures Info: Not present    Additional Factors Info  Code Status Code Status Info: full             Current Medications (06/24/2022):  This is the current hospital active medication list Current Facility-Administered Medications  Medication Dose Route Frequency Provider Last Rate Last Admin   acetaminophen (TYLENOL) tablet 650 mg  650 mg Oral Q4H Nita Sells, MD   650 mg at 06/24/22 0905   alum & mag hydroxide-simeth (MAALOX/MYLANTA) 200-200-20 MG/5ML suspension 30 mL  30 mL Oral Q4H PRN Reubin Milan, MD   30 mL at 06/22/22 1942   And   lidocaine (XYLOCAINE) 2 % viscous mouth solution 15 mL  15 mL Oral Q4H PRN Reubin Milan, MD   15 mL at 06/21/22 1655   atorvastatin (LIPITOR) tablet 10 mg  10 mg Oral Daily Reubin Milan, MD  10 mg at 06/24/22 0905   bisacodyl (DULCOLAX) EC tablet 5 mg  5 mg Oral Daily PRN Hosie Poisson, MD       capsaicin (ZOSTRIX) 0.025 % cream   Topical BID Nita Sells, MD   Given at 06/23/22 1245   cholecalciferol (VITAMIN D3) 25 MCG (1000 UNIT) tablet 1,000 Units  1,000 Units Oral Daily Reubin Milan, MD   1,000 Units at 06/24/22 8099   diphenhydrAMINE-zinc acetate (BENADRYL) 2-0.1 % cream   Topical BID PRN Nita Sells, MD       ferrous sulfate tablet 325 mg  325 mg Oral Q breakfast Reubin Milan, MD   325 mg at 06/24/22 8338   flecainide (TAMBOCOR) tablet 100 mg  100 mg Oral BID Reubin Milan, MD   100 mg at 25/05/39 7673   folic acid (FOLVITE) tablet 1 mg  1 mg Oral Daily Reubin Milan, MD   1 mg at 06/24/22 4193   levothyroxine (SYNTHROID) tablet 112 mcg  112 mcg Oral Daily Reubin Milan, MD   112 mcg at 06/24/22 0606   methocarbamol (ROBAXIN) tablet 500-1,000 mg  500-1,000 mg Oral Q6H PRN Nita Sells, MD   1,000 mg at 06/23/22 2256   metoprolol succinate (TOPROL-XL) 24 hr tablet 25 mg  25 mg Oral Daily Reubin Milan, MD   25 mg at 06/24/22 7902   mycophenolate (MYFORTIC) EC tablet 360 mg  360 mg Oral BID Reubin Milan, MD   360 mg at 06/24/22 0906   ondansetron (ZOFRAN) tablet 4 mg  4 mg Oral Q6H PRN Reubin Milan, MD       Or   ondansetron [facility]) injection 4 mg  4 mg Intravenous Q6H PRN Reubin Milan, MD       oxyCODONE (Oxy IR/ROXICODONE) immediate release tablet 5 mg  5 mg Oral Q3H PRN Reubin Milan, MD   5 mg at 06/24/22 0606   pantoprazole (PROTONIX) EC tablet 20 mg  20 mg Oral BID Reubin Milan, MD   20 mg at 06/24/22 4097   polyvinyl alcohol (LIQUIFILM TEARS) 1.4 % ophthalmic solution 1 drop  1 drop Both Eyes PRN Nita Sells, MD       senna-docusate (Senokot-S) tablet 2 tablet  2 tablet Oral BID Hosie Poisson, MD   2 tablet at 06/24/22 3532   sulfamethoxazole-trimethoprim (BACTRIM DS) 800-160 MG per tablet 1 tablet  1 tablet Oral Once per day on Mon Wed Fri Reubin Milan, MD   1 tablet at 06/22/22 0935   umeclidinium bromide (INCRUSE ELLIPTA) 62.5 MCG/ACT 1 puff  1 puff Inhalation Daily Reubin Milan, MD   1 puff at 06/24/22 9924     Discharge Medications: Please see discharge summary for a list of discharge medications.  Relevant Imaging Results:  Relevant Lab Results:   Additional Information SSN:  [id number]  Leeroy Cha, RN

## 2022-06-24 NOTE — Care Management Important Message (Signed)
Important Message  Patient Details IM Letter given. Name: [name] MRN: [id number] Date of Birth: 07-22-1940   Medicare Important Message Given:  Yes     Kerin Salen 06/24/2022, 1:28 PM

## 2022-06-25 DIAGNOSIS — M549 Dorsalgia, unspecified: Secondary | ICD-10-CM | POA: Diagnosis not present

## 2022-06-25 DIAGNOSIS — E440 Moderate protein-calorie malnutrition: Secondary | ICD-10-CM | POA: Insufficient documentation

## 2022-06-25 LAB — BASIC METABOLIC PANEL
Anion gap: 5 (ref 5–15)
BUN: 8 mg/dL (ref 8–23)
CO2: 27 mmol/L (ref 22–32)
Calcium: 8.9 mg/dL (ref 8.9–10.3)
Chloride: 106 mmol/L (ref 98–111)
Creatinine, Ser: 0.79 mg/dL (ref 0.44–1.00)
GFR, Estimated: >60 mL/min (ref >60)
Glucose, Bld: 96 mg/dL (ref 70–99)
Potassium: 4.1 mmol/L (ref 3.5–5.1)
Sodium: 138 mmol/L (ref 135–145)

## 2022-06-25 MED ORDER — SORBITOL 70 % SOLN
15.0000 mL | Freq: Every day | Status: DC
  Administered 2022-06-25 – 2022-06-27 (×3): 15 mL via ORAL
  Filled 2022-06-25 (×3): qty 30

## 2022-06-25 NOTE — Progress Notes (Signed)
[facility]                                                                               [name], is a 80 y.o. female, DOB - 08-18-1940, ZES:[id number] Admit date - 06/20/2022    Outpatient Primary MD for the patient is Kathyrn Lass, MD  LOS - 4  days    Brief summary   81 y.o. female  chronic atrial fibrillation, history of chest pain with negative Myoview stress test, depression with anxiety, GERD, hemochromatosis, iron deficiency anemia, history of syncopal episode, hypothyroidism who presented to the emergency department complaints of right-sided lower back abdominal pain for the past 6 days that was initially under control after she went to the urgent care was given analgesics and muscle relaxants. Hold however subsequently over the following subsequent days with severe pain and presented to ED  CT renal study show acute mild compression fracture. Chronic mild compression fracture of L3. No acute of the abdomen or pelvis. CTA chest/abdomen/pelvis no thoracoabdominal aortic dissection or aneurysm. There is mild aortic atherosclerosis. Aortic branch vessels widely patent. L2 fracture is also seen    Assessment & Plan    Assessment and Plan:   Intractable back pain secondary to L2 compression fracture Pain control with IV Toradol and oral oxycodone, Increase Tylenol to scheduled to ensure some medication for pain is in system--will attempt to get patient up to chair again today Lidocaine ineffective Continue Robaxin but increase to [phone] every 6 as needed Kyphoplasty is ordered for next week-resumed Eliquis as the earliest this would be done would be 06/30/2021 and patient's authorization for procedure still pending IR will communicate this with [name] would hold Eliquis from 1/2 No changes today  Chronic atrial fibrillation Continue with flecainide 100 mg twice daily  Continue with metoprolol. Eliquis resumed 5 mg twice daily 12/29-see above  discussion  History of emphysema and pulmonary fibrosis Improved not needing nasal cannula at this time discussed with daughter who is at bedside  History of hypothyroidism Continue with Synthroid 112 mcg daily.  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   Weight as of this encounter: 64.5 kg.  Code Status: Full code DVT Prophylaxis:   apixaban (ELIQUIS) tablet 5 mg   Level of Care: Level of care: Med-Surg Family Communication: Discussed with patient's daughter who is at bedside  Disposition Plan:     Remains inpatient appropriate:  appropriate pain control.   Procedures:  None  Consultants: IR consult   Antimicrobials:   Anti-infectives (From admission, onward)    Start     Dose/Rate Route Frequency Ordered Stop   06/21/22 0000  sulfamethoxazole-trimethoprim (BACTRIM DS) 800-160 MG per tablet 1 tablet        1 tablet Oral Once per day on Mon Wed Fri 06/20/22 1815     06/20/22 2200  amoxicillin-clavulanate (AUGMENTIN) 875-125 MG per tablet 1 tablet  1 tablet Oral Every 12 hours 06/20/22 1815 06/21/22 1002        Medications  Scheduled Meds:  acetaminophen  650 mg Oral Q4H   apixaban  5 mg Oral BID   atorvastatin  10 mg Oral Daily   capsaicin   Topical BID   cholecalciferol  1,000 Units Oral Daily   feeding supplement  237 mL Oral Q24H   ferrous sulfate  325 mg Oral Q breakfast   flecainide  100 mg Oral BID   folic acid  1 mg Oral Daily   levothyroxine  112 mcg Oral Daily   metoprolol succinate  25 mg Oral Daily   mycophenolate  360 mg Oral BID   pantoprazole  20 mg Oral BID   polyethylene glycol  17 g Oral Daily   senna-docusate  1 tablet Oral QHS   sorbitol  15 mL Oral Daily    sulfamethoxazole-trimethoprim  1 tablet Oral Once per day on Mon Wed Fri   umeclidinium bromide  1 puff Inhalation Daily   Continuous Infusions: PRN Meds:.alum & mag hydroxide-simeth **AND** lidocaine, diphenhydrAMINE-zinc acetate, methocarbamol, ondansetron **OR** ondansetron (ZOFRAN) IV, oxyCODONE, polyvinyl alcohol    Subjective:   Comfortable but no stool recently and asking for laxative No chest pain  Objective:   Vitals:   06/24/22 1246 06/24/22 2106 06/25/22 0526 06/25/22 0846  BP: 130/65 (!) 111/52 (!) 104/55   Pulse: 62 65 (!) 52   Resp: '16 17 18   '$ Temp: (!) 97.5 F (36.4 C) 98 F (36.7 C) 97.8 F (36.6 C)   TempSrc: Oral Oral Oral   SpO2: 98% 90% 94% 94%  Weight:      Height:        Intake/Output Summary (Last 24 hours) at 06/25/2022 1157 Last data filed at 06/25/2022 0526 Gross per 24 hour  Intake 120 ml  Output 1000 ml  Net -880 ml    Filed Weights   06/20/22 0946 06/20/22 2300  Weight: 62.6 kg 64.5 kg     Exam  Alert coherent no distress sclera is clear chest is clear ROM is intact moving 4 limbs CTAB no added sound Abdomen soft no rebound No lower extremity edema     Data Reviewed:  I have personally reviewed following labs and imaging studies   CBC Lab Results  Component Value Date   WBC 5.7 06/22/2022   RBC 3.73 (L) 06/22/2022   HGB 11.5 (L) 06/22/2022   HCT 37.5 06/22/2022   MCV 100.5 (H) 06/22/2022   MCH 30.8 06/22/2022   PLT 266 06/22/2022   MCHC 30.7 06/22/2022   RDW 19.9 (H) 06/22/2022   LYMPHSABS 1.7 06/22/2022   MONOABS 0.5 06/22/2022   EOSABS 0.4 06/22/2022   BASOSABS 0.1 19/50/9326     Last metabolic panel Lab Results  Component Value Date   NA 138 06/25/2022   K 4.1 06/25/2022   CL 106 06/25/2022   CO2 27 06/25/2022   BUN 8 06/25/2022   CREATININE 0.79 06/25/2022   GLUCOSE 96 06/25/2022   GFRNONAA >60 06/25/2022   GFRAA 101 06/09/2020   CALCIUM 8.9 06/25/2022   PHOS 3.1 05/20/2013   PROT 5.2 (L)  06/21/2022   ALBUMIN 3.0 (L) 06/21/2022   LABGLOB 2.4 10/10/2016   AGRATIO 1.8 10/10/2016   BILITOT 0.4 06/21/2022   ALKPHOS 53 06/21/2022   AST 12 (L) 06/21/2022   ALT 10 06/21/2022   ANIONGAP 5 06/25/2022    CBG (last 3)  No results for input(s): "GLUCAP"  in the last 72 hours.    Coagulation Profile: No results for input(s): "INR", "PROTIME" in the last 168 hours.   Radiology Studies: No results found.     Nita Sells M.D. [facility] 06/25/2022, 11:57 AM  Available via Epic secure chat 7am-7pm After 7 pm, please refer to night coverage provider listed on amion.

## 2022-06-26 ENCOUNTER — Inpatient Hospital Stay (HOSPITAL_COMMUNITY): Payer: Medicare Other

## 2022-06-26 DIAGNOSIS — R768 Other specified abnormal immunological findings in serum: Secondary | ICD-10-CM

## 2022-06-26 DIAGNOSIS — I480 Paroxysmal atrial fibrillation: Secondary | ICD-10-CM | POA: Diagnosis not present

## 2022-06-26 DIAGNOSIS — M549 Dorsalgia, unspecified: Secondary | ICD-10-CM | POA: Diagnosis not present

## 2022-06-26 DIAGNOSIS — Z8639 Personal history of other endocrine, nutritional and metabolic disease: Secondary | ICD-10-CM | POA: Diagnosis not present

## 2022-06-26 DIAGNOSIS — S32020A Wedge compression fracture of second lumbar vertebra, initial encounter for closed fracture: Secondary | ICD-10-CM | POA: Diagnosis not present

## 2022-06-26 LAB — CBC WITH DIFFERENTIAL/PLATELET
Abs Immature Granulocytes: 0.05 10*3/uL (ref 0.00–0.07)
Basophils Absolute: 0.1 10*3/uL (ref 0.0–0.1)
Basophils Relative: 1 %
Eosinophils Absolute: 0.4 10*3/uL (ref 0.0–0.5)
Eosinophils Relative: 5 %
HCT: 40.4 % (ref 36.0–46.0)
Hemoglobin: 12.3 g/dL (ref 12.0–15.0)
Immature Granulocytes: 1 %
Lymphocytes Relative: 20 %
Lymphs Abs: 1.8 10*3/uL (ref 0.7–4.0)
MCH: 31.2 pg (ref 26.0–34.0)
MCHC: 30.4 g/dL (ref 30.0–36.0)
MCV: 102.5 fL — ABNORMAL HIGH (ref 80.0–100.0)
Monocytes Absolute: 0.9 10*3/uL (ref 0.1–1.0)
Monocytes Relative: 10 %
Neutro Abs: 5.8 10*3/uL (ref 1.7–7.7)
Neutrophils Relative %: 63 %
Platelets: 266 10*3/uL (ref 150–400)
RBC: 3.94 MIL/uL (ref 3.87–5.11)
RDW: 19.9 % — ABNORMAL HIGH (ref 11.5–15.5)
WBC: 9 10*3/uL (ref 4.0–10.5)
nRBC: 0 % (ref 0.0–0.2)

## 2022-06-26 LAB — BASIC METABOLIC PANEL
Anion gap: 5 (ref 5–15)
BUN: 10 mg/dL (ref 8–23)
CO2: 27 mmol/L (ref 22–32)
Calcium: 9.3 mg/dL (ref 8.9–10.3)
Chloride: 106 mmol/L (ref 98–111)
Creatinine, Ser: 0.8 mg/dL (ref 0.44–1.00)
GFR, Estimated: >60 mL/min (ref >60)
Glucose, Bld: 96 mg/dL (ref 70–99)
Potassium: 4.4 mmol/L (ref 3.5–5.1)
Sodium: 138 mmol/L (ref 135–145)

## 2022-06-26 LAB — MAGNESIUM: Magnesium: 1.9 mg/dL (ref 1.7–2.4)

## 2022-06-26 MED ORDER — ORAL CARE MOUTH RINSE: 15.0000 mL | OROMUCOSAL | Status: DC | PRN

## 2022-06-26 MED ORDER — BISACODYL 10 MG RE SUPP
10.0000 mg | Freq: Once | RECTAL | Status: AC
  Administered 2022-06-26: 10 mg via RECTAL
  Filled 2022-06-26: qty 1

## 2022-06-26 MED ORDER — FLEET ENEMA 7-19 GM/118ML RE ENEM
1.0000 | ENEMA | Freq: Once | RECTAL | Status: AC
  Administered 2022-06-26: 1 via RECTAL
  Filled 2022-06-26: qty 1

## 2022-06-26 MED ORDER — POLYETHYLENE GLYCOL 3350 17 G PO PACK
17.0000 g | PACK | Freq: Two times a day (BID) | ORAL | Status: DC
  Administered 2022-06-26 – 2022-06-27 (×2): 17 g via ORAL
  Filled 2022-06-26 (×2): qty 1

## 2022-06-26 NOTE — Progress Notes (Signed)
[facility]                                                                               [name], is a 81 y.o. female, DOB - [birth year], KDT:[id number] Admit date - 06/20/2022    Outpatient Primary MD for the patient is Kathyrn Lass, MD  LOS - 5  days    Brief summary   [name] is a 81 y.o. female with medical history significant of chronic atrial fibrillation, history of chest pain with negative Myoview stress test, depression with anxiety, GERD, hemochromatosis, iron deficiency anemia, history of syncopal episode, hypothyroidism who presented to the emergency department complaints of right-sided lower back abdominal pain for the past 6 days that was initially under control after she went to the urgent care was given analgesics and muscle relaxants. However on the day of admission the patient woke up with severe right-sided lumbar spine pain radiated to her right groin.    CT renal study show acute mild compression fracture. Chronic mild compression fracture of L3. No acute of the abdomen or pelvis. CTA chest/abdomen/pelvis no thoracoabdominal aortic dissection or aneurysm. There is mild aortic atherosclerosis. Aortic branch vessels widely patent. L2 fracture is also seen   IR consulted fro kyphoplasty.      Assessment & Plan    Assessment and Plan:    Intractable back pain secondary to L2 compression fracture Pain control with  oral oxycodone, and muscle relaxants.  IR consulted for kyphoplasty. She is scheduled on the 4 th of January. We will hold the eliquis on 06/28/22.         Chronic atrial fibrillation Continue with flecainide 100 mg twice daily and Eliquis for anticoagulation. Continue with metoprolol 25 mg daily.        History of emphysema and pulmonary fibrosis Nasal cannula oxygen as needed       History of hypothyroidism Continue with Synthroid 112 mcg daily.     Auto immune disorder with Scl 70 Antibody  positive Continue with mycophenolate     Hypokalemia Replaced  Hyperlipidemia:  Resume lipitor.     Constipation: probably from oxy and iron supplements. Will hold the iron supplementation briefly until she has a bm.  She is currently on senna, colace, Miralax daily without any BM for the last 8 days.  Will start her on dulcolax suppository / fleet enema. Abdominal x ray does not show obstruction.      RN Pressure Injury Documentation:    Malnutrition Type:  Nutrition Problem: Moderate Malnutrition Etiology: chronic illness   Malnutrition Characteristics:  Signs/Symptoms: mild fat depletion, moderate muscle depletion   Nutrition Interventions:  Interventions: Refer to RD note for recommendations, Ensure Enlive (each supplement provides 350kcal and 20 grams of protein)  Estimated body mass index is 25.19 kg/m as calculated from the following:   Height as of this encounter: '5\' 3"'$  (1.6 m).   Weight as of this encounter: 64.5 kg.  Code Status: full code. DVT Prophylaxis:   apixaban (ELIQUIS) tablet 5 mg   Level of Care: Level of care: Med-Surg Family Communication: daughter at bedside.   Disposition Plan:     Remains inpatient appropriate:  kyphoplasty on Thursday.   Procedures:  Kyphoplasty.  Consultants:   IR consult.   Antimicrobials:   Anti-infectives (From admission, onward)    Start     Dose/Rate Route Frequency Ordered Stop   06/21/22 0000  sulfamethoxazole-trimethoprim (BACTRIM DS) 800-160 MG per tablet 1 tablet        1 tablet Oral Once per day on Mon Wed Fri 06/20/22 1815     06/20/22 2200  amoxicillin-clavulanate (AUGMENTIN) 875-125 MG per tablet 1 tablet        1 tablet Oral Every 12 hours 06/20/22 1815 06/21/22 1002        Medications  Scheduled Meds:  acetaminophen  650 mg Oral Q4H   apixaban  5 mg Oral BID   atorvastatin  10 mg Oral Daily   cholecalciferol  1,000 Units Oral Daily   feeding supplement  237 mL Oral Q24H   ferrous  sulfate  325 mg Oral Q breakfast   flecainide  100 mg Oral BID   folic acid  1 mg Oral Daily   levothyroxine  112 mcg Oral Daily   metoprolol succinate  25 mg Oral Daily   mycophenolate  360 mg Oral BID   pantoprazole  20 mg Oral BID   polyethylene glycol  17 g Oral Daily   senna-docusate  1 tablet Oral QHS   sorbitol  15 mL Oral Daily   sulfamethoxazole-trimethoprim  1 tablet Oral Once per day on Mon Wed Fri   umeclidinium bromide  1 puff Inhalation Daily   Continuous Infusions: PRN Meds:.alum & mag hydroxide-simeth **AND** lidocaine, diphenhydrAMINE-zinc acetate, methocarbamol, ondansetron **OR** ondansetron (ZOFRAN) IV, mouth rinse, oxyCODONE, polyvinyl alcohol    Subjective:   [name] was seen and examined today.  No BM for 8 days.   Objective:   Vitals:   06/25/22 2123 06/26/22 0355 06/26/22 0823 06/26/22 1406  BP: 108/66 102/66  (!) 99/58  Pulse: 70 74  65  Resp: '18 18  16  '$ Temp: 98 F (36.7 C) 97.7 F (36.5 C)  (!) 97.5 F (36.4 C)  TempSrc: Oral Oral  Oral  SpO2: 92% 99% 97% 97%  Weight:      Height:        Intake/Output Summary (Last 24 hours) at 06/26/2022 1428 Last data filed at 06/26/2022 0900 Gross per 24 hour  Intake 360 ml  Output --  Net 360 ml   Filed Weights   06/20/22 0946 06/20/22 2300  Weight: 62.6 kg 64.5 kg     Exam General exam: Appears calm and comfortable  Respiratory system: Clear to auscultation. Respiratory effort normal. Cardiovascular system: S1 & S2 heard, RRR. No JVD, No pedal edema. Gastrointestinal system: Abdomen is nondistended, soft and nontender. Central nervous system: Alert and oriented. No focal neurological deficits. Extremities: Symmetric 5 x 5 power. Skin: No rashes, lesions or ulcers Psychiatry:  Mood & affect appropriate.    Data Reviewed:  I have personally reviewed following labs and imaging studies   CBC Lab Results  Component Value Date   WBC 9.0 06/26/2022   RBC 3.94 06/26/2022   HGB  12.3 06/26/2022   HCT 40.4 06/26/2022   MCV 102.5 (H) 06/26/2022   MCH 31.2 06/26/2022   PLT 266 06/26/2022   MCHC 30.4 06/26/2022   RDW 19.9 (H) 06/26/2022   LYMPHSABS 1.8 06/26/2022   MONOABS 0.9 06/26/2022   EOSABS 0.4 06/26/2022   BASOSABS 0.1 56/21/3086     Last metabolic panel Lab Results  Component Value Date  NA 138 06/26/2022   K 4.4 06/26/2022   CL 106 06/26/2022   CO2 27 06/26/2022   BUN 10 06/26/2022   CREATININE 0.80 06/26/2022   GLUCOSE 96 06/26/2022   GFRNONAA >60 06/26/2022   GFRAA 101 06/09/2020   CALCIUM 9.3 06/26/2022   PHOS 3.1 05/20/2013   PROT 5.2 (L) 06/21/2022   ALBUMIN 3.0 (L) 06/21/2022   LABGLOB 2.4 10/10/2016   AGRATIO 1.8 10/10/2016   BILITOT 0.4 06/21/2022   ALKPHOS 53 06/21/2022   AST 12 (L) 06/21/2022   ALT 10 06/21/2022   ANIONGAP 5 06/26/2022    CBG (last 3)  No results for input(s): "GLUCAP" in the last 72 hours.    Coagulation Profile: No results for input(s): "INR", "PROTIME" in the last 168 hours.   Radiology Studies: DG Abd 2 Views  Result Date: 06/26/2022 CLINICAL DATA:  Lower abdominal pain.  Constipation. EXAM: ABDOMEN - 2 VIEW COMPARISON:  None Available. FINDINGS: The bowel gas pattern is normal. There is no evidence of free air. No radio-opaque calculi or other significant radiographic abnormality is seen. IMPRESSION: Negative two view abdomen. Electronically Signed   By: San Morelle M.D.   On: 06/26/2022 12:43       Hosie Poisson M.D. [facility] 06/26/2022, 2:28 PM  Available via Epic secure chat 7am-7pm After 7 pm, please refer to night coverage provider listed on amion.

## 2022-06-27 DIAGNOSIS — M549 Dorsalgia, unspecified: Secondary | ICD-10-CM | POA: Diagnosis not present

## 2022-06-27 DIAGNOSIS — I480 Paroxysmal atrial fibrillation: Secondary | ICD-10-CM | POA: Diagnosis not present

## 2022-06-27 DIAGNOSIS — Z8639 Personal history of other endocrine, nutritional and metabolic disease: Secondary | ICD-10-CM | POA: Diagnosis not present

## 2022-06-27 DIAGNOSIS — S32020A Wedge compression fracture of second lumbar vertebra, initial encounter for closed fracture: Secondary | ICD-10-CM | POA: Diagnosis not present

## 2022-06-27 MED ORDER — POLYETHYLENE GLYCOL 3350 17 G PO PACK: 17.0000 g | PACK | Freq: Every day | ORAL | Status: DC | PRN

## 2022-06-27 MED ORDER — PREDNISONE 5 MG PO TABS
5.0000 mg | ORAL_TABLET | Freq: Every day | ORAL | Status: DC
  Administered 2022-06-27 – 2022-07-01 (×5): 5 mg via ORAL
  Filled 2022-06-27 (×5): qty 1

## 2022-06-27 MED ORDER — SULFAMETHOXAZOLE-TRIMETHOPRIM 800-160 MG PO TABS: 1.0000 | ORAL_TABLET | ORAL | Status: DC | PRN

## 2022-06-27 MED ORDER — SULFAMETHOXAZOLE-TRIMETHOPRIM 800-160 MG PO TABS
1.0000 | ORAL_TABLET | ORAL | Status: DC
  Administered 2022-06-29 – 2022-07-01 (×2): 1 via ORAL
  Filled 2022-06-27 (×2): qty 1

## 2022-06-27 MED ORDER — SORBITOL 70 % SOLN: 15.0000 mL | Freq: Every day | Status: DC | PRN

## 2022-06-27 MED ORDER — SENNOSIDES-DOCUSATE SODIUM 8.6-50 MG PO TABS: 1.0000 | ORAL_TABLET | Freq: Every evening | ORAL | Status: DC | PRN

## 2022-06-27 NOTE — Progress Notes (Signed)
[facility]                                                                               [name], is a 82 y.o. female, DOB - 1941/05/21, WCB:[id number] Admit date - 06/20/2022    Outpatient Primary MD for the patient is Tara Lass, MD  LOS - 6  days    Brief summary   [name] is a 82 y.o. female with medical history significant of chronic atrial fibrillation, history of chest pain with negative Myoview stress test, depression with anxiety, GERD, hemochromatosis, iron deficiency anemia, history of syncopal episode, hypothyroidism who presented to the emergency department complaints of right-sided lower back abdominal pain for the past 6 days that was initially under control after she went to the urgent care was given analgesics and muscle relaxants. However on the day of admission the patient woke up with severe right-sided lumbar spine pain radiated to her right groin.    CT renal study show acute mild compression fracture. Chronic mild compression fracture of L3. No acute of the abdomen or pelvis. CTA chest/abdomen/pelvis no thoracoabdominal aortic dissection or aneurysm. There is mild aortic atherosclerosis. Aortic branch vessels widely patent. L2 fracture is also seen   IR consulted fro kyphoplasty.      Assessment & Plan    Assessment and Plan:    Intractable back pain secondary to L2 compression fracture Pain control with  oral oxycodone, and muscle relaxants.  IR consulted for kyphoplasty. She is scheduled on the 4 th of January. We will hold the eliquis on 06/28/22.         Chronic atrial fibrillation Continue with flecainide 100 mg twice daily and Eliquis for anticoagulation. Continue with metoprolol 25 mg daily.        History of emphysema and pulmonary fibrosis Nasal cannula oxygen as needed       History of hypothyroidism Continue with Synthroid 112 mcg daily.     Auto immune disorder with Scl 70 Antibody  positive Continue with mycophenolate     Hypokalemia Replaced  Hyperlipidemia:  Resume lipitor.     Constipation: probably from oxy and iron supplements. Will hold the iron supplementation briefly until she has a bm.  She is currently on senna, colace, Miralax . 2 bowel movements yesterday.  Abdominal x ray does not show obstruction.      RN Pressure Injury Documentation:    Malnutrition Type:  Nutrition Problem: Moderate Malnutrition Etiology: chronic illness   Malnutrition Characteristics:  Signs/Symptoms: mild fat depletion, moderate muscle depletion   Nutrition Interventions:  Interventions: Refer to RD note for recommendations, Ensure Enlive (each supplement provides 350kcal and 20 grams of protein)  Estimated body mass index is 25.19 kg/m as calculated from the following:   Height as of this encounter: '5\' 3"'$  (1.6 m).   Weight as of this encounter: 64.5 kg.  Code Status: full code. DVT Prophylaxis:   apixaban (ELIQUIS) tablet 5 mg   Level of Care: Level of care: Med-Surg Family Communication: daughter at bedside.   Disposition Plan:     Remains inpatient appropriate:  kyphoplasty on Thursday.   Procedures:  Kyphoplasty.  Consultants:  IR consult.   Antimicrobials:   Anti-infectives (From admission, onward)    Start     Dose/Rate Route Frequency Ordered Stop   06/21/22 0000  sulfamethoxazole-trimethoprim (BACTRIM DS) 800-160 MG per tablet 1 tablet        1 tablet Oral Once per day on Mon Wed Fri 06/20/22 1815     06/20/22 2200  amoxicillin-clavulanate (AUGMENTIN) 875-125 MG per tablet 1 tablet        1 tablet Oral Every 12 hours 06/20/22 1815 06/21/22 1002        Medications  Scheduled Meds:  acetaminophen  650 mg Oral Q4H   apixaban  5 mg Oral BID   atorvastatin  10 mg Oral Daily   cholecalciferol  1,000 Units Oral Daily   feeding supplement  237 mL Oral Q24H   flecainide  100 mg Oral BID   folic acid  1 mg Oral Daily    levothyroxine  112 mcg Oral Daily   metoprolol succinate  25 mg Oral Daily   mycophenolate  360 mg Oral BID   pantoprazole  20 mg Oral BID   polyethylene glycol  17 g Oral BID   senna-docusate  1 tablet Oral QHS   sorbitol  15 mL Oral Daily   sulfamethoxazole-trimethoprim  1 tablet Oral Once per day on Mon Wed Fri   umeclidinium bromide  1 puff Inhalation Daily   Continuous Infusions: PRN Meds:.alum & mag hydroxide-simeth **AND** lidocaine, diphenhydrAMINE-zinc acetate, methocarbamol, ondansetron **OR** ondansetron (ZOFRAN) IV, mouth rinse, oxyCODONE, polyvinyl alcohol    Subjective:   [name] was seen and examined today.  2 bm yesterday.   Objective:   Vitals:   06/27/22 0509 06/27/22 0938 06/27/22 0940 06/27/22 1023  BP: (!) 106/56   (!) 110/58  Pulse: 63   64  Resp: 18     Temp: 98.1 F (36.7 C)     TempSrc: Oral     SpO2: 95% 96% 97%   Weight:      Height:        Intake/Output Summary (Last 24 hours) at 06/27/2022 1106 Last data filed at 06/27/2022 0959 Gross per 24 hour  Intake 600 ml  Output --  Net 600 ml    Filed Weights   06/20/22 0946 06/20/22 2300  Weight: 62.6 kg 64.5 kg     Exam General exam: Appears calm and comfortable  Respiratory system: Clear to auscultation. Respiratory effort normal. Cardiovascular system: S1 & S2 heard, RRR. No JVD, No pedal edema. Gastrointestinal system: Abdomen is nondistended, soft and nontender.  Central nervous system: Alert and oriented. No focal neurological deficits. Extremities: Symmetric 5 x 5 power. Skin: No rashes, lesions or ulcers Psychiatry:  Mood & affect appropriate.    Data Reviewed:  I have personally reviewed following labs and imaging studies   CBC Lab Results  Component Value Date   WBC 9.0 06/26/2022   RBC 3.94 06/26/2022   HGB 12.3 06/26/2022   HCT 40.4 06/26/2022   MCV 102.5 (H) 06/26/2022   MCH 31.2 06/26/2022   PLT 266 06/26/2022   MCHC 30.4 06/26/2022   RDW 19.9 (H)  06/26/2022   LYMPHSABS 1.8 06/26/2022   MONOABS 0.9 06/26/2022   EOSABS 0.4 06/26/2022   BASOSABS 0.1 66/11/3014     Last metabolic panel Lab Results  Component Value Date   NA 138 06/26/2022   K 4.4 06/26/2022   CL 106 06/26/2022   CO2 27 06/26/2022   BUN 10 06/26/2022   CREATININE  0.80 06/26/2022   GLUCOSE 96 06/26/2022   GFRNONAA >60 06/26/2022   GFRAA 101 06/09/2020   CALCIUM 9.3 06/26/2022   PHOS 3.1 05/20/2013   PROT 5.2 (L) 06/21/2022   ALBUMIN 3.0 (L) 06/21/2022   LABGLOB 2.4 10/10/2016   AGRATIO 1.8 10/10/2016   BILITOT 0.4 06/21/2022   ALKPHOS 53 06/21/2022   AST 12 (L) 06/21/2022   ALT 10 06/21/2022   ANIONGAP 5 06/26/2022    CBG (last 3)  No results for input(s): "GLUCAP" in the last 72 hours.    Coagulation Profile: No results for input(s): "INR", "PROTIME" in the last 168 hours.   Radiology Studies: DG Abd 2 Views  Result Date: 06/26/2022 CLINICAL DATA:  Lower abdominal pain.  Constipation. EXAM: ABDOMEN - 2 VIEW COMPARISON:  None Available. FINDINGS: The bowel gas pattern is normal. There is no evidence of free air. No radio-opaque calculi or other significant radiographic abnormality is seen. IMPRESSION: Negative two view abdomen. Electronically Signed   By: San Morelle M.D.   On: 06/26/2022 12:43       Hosie Poisson M.D. [facility] 06/27/2022, 11:06 AM  Available via Epic secure chat 7am-7pm After 7 pm, please refer to night coverage provider listed on amion.

## 2022-06-27 NOTE — Care Management Important Message (Signed)
Important Message  Patient Details IM Letter given. Name: [name] MRN: [id number] Date of Birth: August 12, 1940   Medicare Important Message Given:  Yes     Kerin Salen 06/27/2022, 12:56 PM

## 2022-06-27 NOTE — Plan of Care (Signed)

## 2022-06-27 NOTE — Progress Notes (Signed)
Physical Therapy Treatment Patient Details Name: [name] MRN: [id number] DOB: [birth year] Today's Date: 06/27/2022   History of Present Illness Pt is an 82 yr old female presenting to [facility] ED with lower back and abdominal pain; Imaging showed chronic mild L3 compression fx with acute L2 compression fx. PMH: afib, chest pain, GERD, hypothyroidism.    PT Comments    Pt progressing well, incr amb distance/tolerance. Pt should be able to d/c home with HHPT and  family assist. Will continue to follow in acute setting  Recommendations for follow up therapy are one component of a multi-disciplinary discharge planning process, led by the attending physician.  Recommendations may be updated based on patient status, additional functional criteria and insurance authorization.  Follow Up Recommendations  Home health PT     Assistance Recommended at Discharge Intermittent Supervision/Assistance  Patient can return home with the following A little help with bathing/dressing/bathroom;Assistance with cooking/housework;Assist for transportation;Help with stairs or ramp for entrance   Equipment Recommendations  Other (comment) (RW-?)    Recommendations for Other Services       Precautions / Restrictions Precautions Precautions: Back Precaution Comments: no BLT, use TSLO when out of bed, logroll Required Braces or Orthoses: Spinal Brace Spinal Brace: Thoracolumbosacral orthotic Restrictions Weight Bearing Restrictions: No     Mobility  Bed Mobility Overal bed mobility: Needs Assistance Bed Mobility: Rolling, Sidelying to Sit, Sit to Supine Rolling: Supervision Sidelying to sit: Supervision, HOB elevated   Sit to supine: Supervision   General bed mobility comments: Pt able to demonstrate log rolling technique with very good form, supervision for safety only.    Transfers Overall transfer level: Needs assistance Equipment used: Rolling walker (2 wheels) Transfers: Sit to/from  Stand Sit to Stand: Supervision           General transfer comment: for safety only    Ambulation/Gait Ambulation/Gait assistance: Supervision, Min guard Gait Distance (Feet): 250 Feet Assistive device: Rolling walker (2 wheels) Gait Pattern/deviations: Step-through pattern, Decreased stride length, Narrow base of support       General Gait Details: RW for safety, not heavily reliant on UEs; able to amb ~ 8' without device   Stairs             Wheelchair Mobility    Modified Rankin (Stroke Patients Only)       Balance   Sitting-balance support: Feet supported, No upper extremity supported Sitting balance-Leahy Scale: Good     Standing balance support: No upper extremity supported, During functional activity Standing balance-Leahy Scale: Fair                              Cognition Arousal/Alertness: Awake/alert Behavior During Therapy: WFL for tasks assessed/performed Overall Cognitive Status: Within Functional Limits for tasks assessed                                          Exercises      General Comments        Pertinent Vitals/Pain Pain Assessment Pain Assessment: Faces Faces Pain Scale: Hurts a little bit Pain Location: L side/back Pain Descriptors / Indicators: Discomfort Pain Intervention(s): Monitored during session    Home Living                          Prior Function  PT Goals (current goals can now be found in the care plan section) Acute Rehab PT Goals Patient Stated Goal: To reduce pain PT Goal Formulation: With patient Time For Goal Achievement: 07/07/22 Potential to Achieve Goals: Good Progress towards PT goals: Progressing toward goals    Frequency    Min 3X/week      PT Plan Current plan remains appropriate    Co-evaluation              AM-PAC PT "6 Clicks" Mobility   Outcome Measure  Help needed turning from your back to your side while in a flat  bed without using bedrails?: None Help needed moving from lying on your back to sitting on the side of a flat bed without using bedrails?: None Help needed moving to and from a bed to a chair (including a wheelchair)?: None Help needed standing up from a chair using your arms (e.g., wheelchair or bedside chair)?: None Help needed to walk in hospital room?: A Little Help needed climbing 3-5 steps with a railing? : A Little 6 Click Score: 22    End of Session Equipment Utilized During Treatment: Back brace Activity Tolerance: Patient tolerated treatment well Patient left: in bed;with call bell/phone within reach;with bed alarm set Nurse Communication: Mobility status PT Visit Diagnosis: Difficulty in walking, not elsewhere classified (R26.2);Muscle weakness (generalized) (M62.81)     Time: 9767-3419 PT Time Calculation (min) (ACUTE ONLY): 21 min  Charges:  $Gait Training: 8-22 mins                     Baxter Flattery, PT  Acute Rehab Dept [facility]) [phone]  [facility] Weekend Pager [facility] only)  [phone]  06/27/2022    [facility] 06/27/2022, 11:09 AM

## 2022-06-28 DIAGNOSIS — M549 Dorsalgia, unspecified: Secondary | ICD-10-CM | POA: Diagnosis not present

## 2022-06-28 DIAGNOSIS — Z8639 Personal history of other endocrine, nutritional and metabolic disease: Secondary | ICD-10-CM | POA: Diagnosis not present

## 2022-06-28 DIAGNOSIS — S32020A Wedge compression fracture of second lumbar vertebra, initial encounter for closed fracture: Secondary | ICD-10-CM | POA: Diagnosis not present

## 2022-06-28 DIAGNOSIS — I480 Paroxysmal atrial fibrillation: Secondary | ICD-10-CM | POA: Diagnosis not present

## 2022-06-28 MED ORDER — APIXABAN 5 MG PO TABS
5.0000 mg | ORAL_TABLET | Freq: Two times a day (BID) | ORAL | Status: DC
  Administered 2022-07-01: 5 mg via ORAL
  Filled 2022-06-28: qty 1

## 2022-06-28 NOTE — Progress Notes (Signed)
[facility]                                                                               [name], is a 82 y.o. female, DOB - May 26, 1941, [id number] Admit date - 06/20/2022    Outpatient Primary MD for the patient is Tara Lass, MD  [name] - 7  days    Brief summary   [name] is a 82 y.o. female with medical history significant of chronic atrial fibrillation, history of chest pain with negative Myoview stress test, depression with anxiety, GERD, hemochromatosis, iron deficiency anemia, history of syncopal episode, hypothyroidism who presented to the emergency department complaints of right-sided lower back abdominal pain for the past 6 days that was initially under control after she went to the urgent care was given analgesics and muscle relaxants. However on the day of admission the patient woke up with severe right-sided lumbar spine pain radiated to her right groin.    CT renal study show acute mild compression fracture. Chronic mild compression fracture of L3. No acute of the abdomen or pelvis. CTA chest/abdomen/pelvis no thoracoabdominal aortic dissection or aneurysm. There is mild aortic atherosclerosis. Aortic branch vessels widely patent. L2 fracture is also seen   IR consulted fro kyphoplasty. She is scheduled for IR kyphoplasty in am.      Assessment & Plan    Assessment and Plan:    Intractable back pain secondary to L2 compression fracture Pain control with  oral oxycodone, and muscle relaxants.  IR consulted for kyphoplasty. She is scheduled on the 4 th of January. We will hold the eliquis  today.         Chronic atrial fibrillation Continue with flecainide 100 mg twice daily and Eliquis for anticoagulation. Continue with metoprolol 25 mg daily.        History of emphysema and pulmonary fibrosis Nasal cannula oxygen as needed       History of hypothyroidism Continue with Synthroid 112 mcg daily.     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     Constipation: probably from oxy and iron supplements. Will hold the iron supplementation briefly until she has a bm.  She is currently on senna, colace, Miralax .  Resolved.  Abdominal x ray does not show obstruction.      RN Pressure Injury Documentation:    Malnutrition Type:  Nutrition Problem: Moderate Malnutrition Etiology: chronic illness   Malnutrition Characteristics:  Signs/Symptoms: mild fat depletion, moderate muscle depletion   Nutrition Interventions:  Interventions: Refer to RD note for recommendations, Ensure Enlive (each supplement provides 350kcal and 20 grams of protein)  Estimated body mass index is 25.19 kg/m as calculated from the following:   Height as of this encounter: '5\' 3"'$  (1.6 m).   Weight as of this encounter: 64.5 kg.  Code Status: full code. DVT Prophylaxis:   apixaban (ELIQUIS) tablet 5 mg   Level of Care: Level of care: Med-Surg Family Communication: daughter at bedside.   Disposition Plan:     Remains inpatient appropriate:  kyphoplasty on Thursday.   Procedures:  Kyphoplasty.  Consultants:   IR consult.   Antimicrobials:   Anti-infectives (From admission, onward)    Start     Dose/Rate Route Frequency Ordered Stop   06/29/22 1000  sulfamethoxazole-trimethoprim (BACTRIM DS) 800-160 MG per tablet 1 tablet        1 tablet Oral Once per day on Mon Wed Fri 06/27/22 1153     06/27/22 1115  sulfamethoxazole-trimethoprim (BACTRIM DS) 800-160 MG per tablet 1 tablet  Status:  Discontinued        1 tablet Oral As needed 06/27/22 1108 06/27/22 1153   06/21/22 0000  sulfamethoxazole-trimethoprim (BACTRIM DS) 800-160 MG per tablet 1 tablet  Status:  Discontinued        1 tablet Oral Once per day on Mon Wed Fri 06/20/22 1815 06/27/22 1108   06/20/22 2200  amoxicillin-clavulanate (AUGMENTIN) 875-125 MG per  tablet 1 tablet        1 tablet Oral Every 12 hours 06/20/22 1815 06/21/22 1002        Medications  Scheduled Meds:  acetaminophen  650 mg Oral Q4H   [START ON 07/02/2022] apixaban  5 mg Oral BID   atorvastatin  10 mg Oral Daily   cholecalciferol  1,000 Units Oral Daily   feeding supplement  237 mL Oral Q24H   flecainide  100 mg Oral BID   folic acid  1 mg Oral Daily   levothyroxine  112 mcg Oral Daily   metoprolol succinate  25 mg Oral Daily   mycophenolate  360 mg Oral BID   pantoprazole  20 mg Oral BID   predniSONE  5 mg Oral Q breakfast   [START ON 06/29/2022] sulfamethoxazole-trimethoprim  1 tablet Oral Once per day on Mon Wed Fri   umeclidinium bromide  1 puff Inhalation Daily   Continuous Infusions: PRN Meds:.alum & mag hydroxide-simeth **AND** lidocaine, diphenhydrAMINE-zinc acetate, methocarbamol, ondansetron **OR** ondansetron (ZOFRAN) IV, mouth rinse, oxyCODONE, polyethylene glycol, polyvinyl alcohol, senna-docusate, sorbitol    Subjective:   [name] was seen and examined today.  No more constipation.   Objective:   Vitals:   06/27/22 2244 06/28/22 0425 06/28/22 1023 06/28/22 1325  BP: (!) 104/56 110/64 (!) 99/54 (!) 107/51  Pulse: 62 65 (!) 56 60  Resp: '15 16 18 18  '$ Temp: 97.9 F (36.6 C) 97.9 F (36.6 C) 97.7 F (36.5 C) 97.9 F (36.6 C)  TempSrc:   Oral Oral  SpO2: 95% 93% 97% 97%  Weight:      Height:        Intake/Output Summary (Last 24 hours) at 06/28/2022 1833 Last data filed at 06/28/2022 1351 Gross per 24 hour  Intake 460 ml  Output 0 ml  Net 460 ml    Filed Weights   06/20/22 0946 06/20/22 2300  Weight: 62.6 kg 64.5 kg     Exam General exam: Appears calm and comfortable  Respiratory system: Clear to auscultation. Respiratory effort normal. Cardiovascular system: S1 & S2 heard, RRR. No JVD, No pedal edema. Gastrointestinal system: Abdomen is nondistended, soft and nontender.  Central nervous system: Alert and oriented. No  focal neurological deficits. Extremities: Symmetric 5 x 5 power. Skin: No rashes, lesions or ulcers Psychiatry:  Mood & affect appropriate.     Data Reviewed:  I have personally reviewed following labs and imaging studies   CBC Lab Results  Component Value Date   WBC 9.0 06/26/2022   RBC 3.94 06/26/2022   HGB 12.3 06/26/2022   HCT 40.4 06/26/2022   MCV  102.5 (H) 06/26/2022   MCH 31.2 06/26/2022   PLT 266 06/26/2022   MCHC 30.4 06/26/2022   RDW 19.9 (H) 06/26/2022   LYMPHSABS 1.8 06/26/2022   MONOABS 0.9 06/26/2022   EOSABS 0.4 06/26/2022   BASOSABS 0.1 96/09/5407     Last metabolic panel Lab Results  Component Value Date   NA 138 06/26/2022   K 4.4 06/26/2022   CL 106 06/26/2022   CO2 27 06/26/2022   BUN 10 06/26/2022   CREATININE 0.80 06/26/2022   GLUCOSE 96 06/26/2022   GFRNONAA >60 06/26/2022   GFRAA 101 06/09/2020   CALCIUM 9.3 06/26/2022   PHOS 3.1 05/20/2013   PROT 5.2 (L) 06/21/2022   ALBUMIN 3.0 (L) 06/21/2022   LABGLOB 2.4 10/10/2016   AGRATIO 1.8 10/10/2016   BILITOT 0.4 06/21/2022   ALKPHOS 53 06/21/2022   AST 12 (L) 06/21/2022   ALT 10 06/21/2022   ANIONGAP 5 06/26/2022    CBG (last 3)  No results for input(s): "GLUCAP" in the last 72 hours.    Coagulation Profile: No results for input(s): "INR", "PROTIME" in the last 168 hours.   Radiology Studies: No results found.     Hosie Poisson M.D. [facility] 06/28/2022, 6:33 PM  Available via Epic secure chat 7am-7pm After 7 pm, please refer to night coverage provider listed on amion.

## 2022-06-28 NOTE — Telephone Encounter (Signed)
Hey ladies,  I called and spoke to patient and let her know that we can cancel the CT scan that is scheduled for 06/30/2022.  Can we cancel this for her because she states she is currently in the hospital and is undergoing surgery.  Please advise thank you

## 2022-06-28 NOTE — Progress Notes (Signed)
Mobility Specialist - Progress Note   06/28/22 1210  Mobility  Activity Ambulated independently in hallway  Level of Assistance Independent  Assistive Device None  Distance Ambulated (ft) 350 ft  Activity Response Tolerated well  Mobility Referral Yes  $Mobility charge 1 Mobility   Pt received in bed and agreeable to mobility. No complaints during mobility. Pt to bed after session with all needs met.    [facility]

## 2022-06-28 NOTE — Telephone Encounter (Signed)
  Patient is calling asking if the CT of her Chest can be canceled on 06/30/2021 due to her having CT scans done in hospital. But when I looked at chart it was CT for renal stones. L-spine and Angio chest. And I was not sure if this was comparable with what you needed. Im thinking that she is still needing to get her CT Chest done that was ordered to be done in January correct?   Please advise sir   Lessie Dings can you please tell me if the CT is still needed given MR is out of office till the 4th. I know this is not an emergency but the patient is really wanting to know.   Thank you

## 2022-06-28 NOTE — Telephone Encounter (Signed)
I have cancelled pt's CT.

## 2022-06-28 NOTE — TOC Progression Note (Signed)
Transition of Care [facility]) - Progression Note    Patient Details  Name: [name] MRN: [id number] Date of Birth: Nov 06, 1940  [facility] of [facility]) CM/SW Contact  Lennart Pall, LCSW Phone Number: 06/28/2022, 2:42 PM  Clinical Narrative:    Met with pt to discuss possible dc needs.  Pt very pleasant and notes plans for kyphoplasty on Thursday.  She feels she has made progress with mobility and plans for dc directly home.  In the home are her two adult daughters and a grandson and notes they can provide any needed assistance.  TOC will monitor for follow up recommendations.   Expected Discharge Plan: Home/Self Care Barriers to Discharge: Continued Medical Work up  Expected Discharge Plan and Services   Discharge Planning Services: CM Consult   Living arrangements for the past 2 months: Single Family Home                                       Social Determinants of Health (SDOH) Interventions SDOH Screenings   Food Insecurity: No Food Insecurity (06/20/2022)  Housing: Low Risk  (06/20/2022)  Transportation Needs: No Transportation Needs (06/20/2022)  Utilities: Not At Risk (06/20/2022)  Depression (PHQ2-9): Low Risk  (08/21/2018)  Tobacco Use: Medium Risk (06/21/2022)    Readmission Risk Interventions     No data to display

## 2022-06-28 NOTE — Telephone Encounter (Signed)
I think OK to cancel the HRCT chest since she had recent CT Chest in the hospital, although this is a different format than the scan ordered by Dr. Chase Caller.   She will need to have her pulmonary function tests completed so they can compare to prior breathing tests.  Thanks, JD

## 2022-06-29 ENCOUNTER — Encounter (HOSPITAL_COMMUNITY): Payer: Self-pay | Admitting: Internal Medicine

## 2022-06-29 DIAGNOSIS — S32020A Wedge compression fracture of second lumbar vertebra, initial encounter for closed fracture: Secondary | ICD-10-CM | POA: Diagnosis not present

## 2022-06-29 DIAGNOSIS — Z8639 Personal history of other endocrine, nutritional and metabolic disease: Secondary | ICD-10-CM | POA: Diagnosis not present

## 2022-06-29 DIAGNOSIS — I480 Paroxysmal atrial fibrillation: Secondary | ICD-10-CM | POA: Diagnosis not present

## 2022-06-29 DIAGNOSIS — M549 Dorsalgia, unspecified: Secondary | ICD-10-CM | POA: Diagnosis not present

## 2022-06-29 MED ORDER — CEFAZOLIN SODIUM-DEXTROSE 2-4 GM/100ML-% IV SOLN: 2.0000 g | INTRAVENOUS | Status: AC

## 2022-06-29 NOTE — Progress Notes (Signed)
[facility]                                                                               [name], is a 82 y.o. female, DOB - 01-02-1941, [id number] Admit date - 06/20/2022    Outpatient Primary MD for the patient is Kathyrn Lass, MD  LOS - 8  days    Brief summary   [name] is a 82 y.o. female with medical history significant of chronic atrial fibrillation, history of chest pain with negative Myoview stress test, depression with anxiety, GERD, hemochromatosis, iron deficiency anemia, history of syncopal episode, hypothyroidism who presented to the emergency department complaints of right-sided lower back abdominal pain for the past 6 days that was initially under control after she went to the urgent care was given analgesics and muscle relaxants. However on the day of admission the patient woke up with severe right-sided lumbar spine pain radiated to her right groin.    CT renal study show acute mild compression fracture. Chronic mild compression fracture of L3. No acute of the abdomen or pelvis. CTA chest/abdomen/pelvis no thoracoabdominal aortic dissection or aneurysm. There is mild aortic atherosclerosis. Aortic branch vessels widely patent. L2 fracture is also seen   IR consulted fro kyphoplasty. She is scheduled for IR kyphoplasty in am.      Assessment & Plan    Assessment and Plan:    Intractable back pain secondary to L2 compression fracture Pain control with  oral oxycodone, and muscle relaxants.  IR consulted for kyphoplasty. She is scheduled on the 4 th of January.  Eliquis on hold.         Chronic atrial fibrillation Continue with flecainide 100 mg twice daily and Eliquis for anticoagulation. Continue with metoprolol 25 mg daily.        History of emphysema and pulmonary fibrosis Nasal cannula oxygen as needed       History of hypothyroidism Continue with Synthroid 112 mcg daily.     Auto immune disorder with Scl 70  Antibody positive Continue with mycophenolate     Hypokalemia Replaced  Hyperlipidemia:  Resume lipitor.     Constipation: probably from oxy and iron supplements. Will hold the iron supplementation briefly until she has a bm.  She is currently on senna, colace, Miralax .  Resolved.  Abdominal x ray does not show obstruction.      RN Pressure Injury Documentation:    Malnutrition Type:  Nutrition Problem: Moderate Malnutrition Etiology: chronic illness   Malnutrition Characteristics:  Signs/Symptoms: mild fat depletion, moderate muscle depletion   Nutrition Interventions:  Interventions: Refer to RD note for recommendations, Ensure Enlive (each supplement provides 350kcal and 20 grams of protein)  Estimated body mass index is 25.19 kg/m as calculated from the following:   Height as of this encounter: '5\' 3"'$  (1.6 m).   Weight as of this encounter: 64.5 kg.  Code Status: full code. DVT Prophylaxis:   apixaban (ELIQUIS) tablet 5 mg   Level of Care: Level of care: Med-Surg Family Communication: daughter at bedside.   Disposition Plan:     Remains inpatient appropriate:  kyphoplasty on Thursday.   Procedures:  Kyphoplasty.  Consultants:   IR consult.   Antimicrobials:   Anti-infectives (From admission, onward)    Start     Dose/Rate Route Frequency Ordered Stop   06/30/22 1100  ceFAZolin (ANCEF) IVPB 2g/100 mL premix        2 g 200 mL/hr over 30 Minutes Intravenous On call 06/29/22 1102 07/01/22 1100   06/29/22 1000  sulfamethoxazole-trimethoprim (BACTRIM DS) 800-160 MG per tablet 1 tablet        1 tablet Oral Once per day on Mon Wed Fri 06/27/22 1153     06/27/22 1115  sulfamethoxazole-trimethoprim (BACTRIM DS) 800-160 MG per tablet 1 tablet  Status:  Discontinued        1 tablet Oral As needed 06/27/22 1108 06/27/22 1153   06/21/22 0000  sulfamethoxazole-trimethoprim (BACTRIM DS) 800-160 MG per tablet 1 tablet  Status:  Discontinued        1 tablet Oral  Once per day on Mon Wed Fri 06/20/22 1815 06/27/22 1108   06/20/22 2200  amoxicillin-clavulanate (AUGMENTIN) 875-125 MG per tablet 1 tablet        1 tablet Oral Every 12 hours 06/20/22 1815 06/21/22 1002        Medications  Scheduled Meds:  acetaminophen  650 mg Oral Q4H   [START ON 07/02/2022] apixaban  5 mg Oral BID   atorvastatin  10 mg Oral Daily   cholecalciferol  1,000 Units Oral Daily   feeding supplement  237 mL Oral Q24H   flecainide  100 mg Oral BID   folic acid  1 mg Oral Daily   levothyroxine  112 mcg Oral Daily   metoprolol succinate  25 mg Oral Daily   mycophenolate  360 mg Oral BID   pantoprazole  20 mg Oral BID   predniSONE  5 mg Oral Q breakfast   sulfamethoxazole-trimethoprim  1 tablet Oral Once per day on Mon Wed Fri   umeclidinium bromide  1 puff Inhalation Daily   Continuous Infusions:  [START ON 06/30/2022]  ceFAZolin (ANCEF) IV     PRN Meds:.alum & mag hydroxide-simeth **AND** lidocaine, diphenhydrAMINE-zinc acetate, methocarbamol, ondansetron **OR** ondansetron (ZOFRAN) IV, mouth rinse, oxyCODONE, polyethylene glycol, polyvinyl alcohol, senna-docusate, sorbitol    Subjective:   Marlon Vonruden was seen and examined today. Bowel movement in am.   Objective:   Vitals:   06/29/22 0717 06/29/22 0719 06/29/22 1039 06/29/22 1427  BP:    (!) 103/49  Pulse:   64 (!) 58  Resp:    18  Temp:    98.5 F (36.9 C)  TempSrc:    Oral  SpO2: 97% 97%  95%  Weight:      Height:        Intake/Output Summary (Last 24 hours) at 06/29/2022 1515 Last data filed at 06/29/2022 0603 Gross per 24 hour  Intake 360 ml  Output --  Net 360 ml    Filed Weights   06/20/22 0946 06/20/22 2300  Weight: 62.6 kg 64.5 kg     Exam General exam: Appears calm and comfortable  Respiratory system: Clear to auscultation. Respiratory effort normal. Cardiovascular system: S1 & S2 heard, RRR. No JVD, No pedal edema. Gastrointestinal system: Abdomen is nondistended, soft and  nontender. No organomegaly or masses felt. Central nervous system: Alert and oriented. No focal neurological deficits. Extremities: Symmetric 5 x 5 power. Skin: No rashes, lesions or ulcers Psychiatry:  Mood & affect appropriate.     Data Reviewed:  I have personally reviewed following labs and imaging studies  CBC Lab Results  Component Value Date   WBC 9.0 06/26/2022   RBC 3.94 06/26/2022   HGB 12.3 06/26/2022   HCT 40.4 06/26/2022   MCV 102.5 (H) 06/26/2022   MCH 31.2 06/26/2022   PLT 266 06/26/2022   MCHC 30.4 06/26/2022   RDW 19.9 (H) 06/26/2022   LYMPHSABS 1.8 06/26/2022   MONOABS 0.9 06/26/2022   EOSABS 0.4 06/26/2022   BASOSABS 0.1 06/19/8249     Last metabolic panel Lab Results  Component Value Date   NA 138 06/26/2022   K 4.4 06/26/2022   CL 106 06/26/2022   CO2 27 06/26/2022   BUN 10 06/26/2022   CREATININE 0.80 06/26/2022   GLUCOSE 96 06/26/2022   GFRNONAA >60 06/26/2022   GFRAA 101 06/09/2020   CALCIUM 9.3 06/26/2022   PHOS 3.1 05/20/2013   PROT 5.2 (L) 06/21/2022   ALBUMIN 3.0 (L) 06/21/2022   LABGLOB 2.4 10/10/2016   AGRATIO 1.8 10/10/2016   BILITOT 0.4 06/21/2022   ALKPHOS 53 06/21/2022   AST 12 (L) 06/21/2022   ALT 10 06/21/2022   ANIONGAP 5 06/26/2022    CBG (last 3)  No results for input(s): "GLUCAP" in the last 72 hours.    Coagulation Profile: No results for input(s): "INR", "PROTIME" in the last 168 hours.   Radiology Studies: No results found.     Hosie Poisson M.D. [facility] 06/29/2022, 3:15 PM  Available via Epic secure chat 7am-7pm After 7 pm, please refer to night coverage provider listed on amion.

## 2022-06-29 NOTE — Consult Note (Signed)
Chief Complaint: Patient was seen in consultation today for L2 compression fracture   Referring Physician(s): Dr. Karleen Hampshire  Supervising Physician: Ruthann Cancer  Patient Status: [facility] - In-[facility]  History of Present Illness: [name] is a 82 y.o. female admitted with intractable low back pain. No known injury or fall. Hx of osteoporosis, DEXA scan from 04/2022 reviewed. Imaging workup finds evidence of acute L2 compression fracture. Chronic healed L3 fracture as well. IR consulted for consideration of kyphoplasty. PMHx, meds, labs, imaging, allergies reviewed. Family at bedside   Past Medical History:  Diagnosis Date   Atrial fibrillation ([place])    a. Flecainide therapy;  b. event monitor 4/12   Chest pain    a. GXT myoview 4/12: no isch., EF 86%;   b. 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 Beryle Beams   History of syncope    Hypothyroidism    Osteoporosis     Past Surgical History:  Procedure Laterality Date   MOUTH SURGERY     SHOULDER ARTHROSCOPY Left    TOTAL ABDOMINAL HYSTERECTOMY     VARICOSE VEIN SURGERY      Allergies: Patient has no known allergies.  Medications:  Current Facility-Administered Medications:    acetaminophen (TYLENOL) tablet 650 mg, 650 mg, Oral, Q4H, 650 mg at 06/29/22 0636 **OR** [DISCONTINUED] acetaminophen (TYLENOL) suppository 650 mg, 650 mg, Rectal, Q6H PRN, Hosie Poisson, MD   alum & mag hydroxide-simeth (MAALOX/MYLANTA) 200-200-20 MG/5ML suspension 30 mL, 30 mL, Oral, Q4H PRN, 30 mL at 06/24/22 1521 **AND** lidocaine (XYLOCAINE) 2 % viscous mouth solution 15 mL, 15 mL, Oral, Q4H PRN, Reubin Milan, MD, 15 mL at 06/21/22 1655   [START ON 07/02/2022] apixaban (ELIQUIS) tablet 5 mg, 5 mg, Oral, BID, Han, Aimee H, PA-C   atorvastatin (LIPITOR) tablet 10 mg, 10 mg, Oral, Daily, Reubin Milan, MD, 10 mg at 06/28/22 0957   cholecalciferol (VITAMIN D3) 25 MCG (1000 UNIT) tablet 1,000 Units, 1,000 Units, Oral, Daily, Reubin Milan, MD, 1,000 Units at 06/28/22 0957   diphenhydrAMINE-zinc acetate (BENADRYL) 2-0.1 % cream, , Topical, BID PRN, Nita Sells, MD   feeding supplement (ENSURE ENLIVE / ENSURE PLUS) liquid 237 mL, 237 mL, Oral, Q24H, Samtani, Jai-Gurmukh, MD, 237 mL at 06/28/22 1718   flecainide (TAMBOCOR) tablet 100 mg, 100 mg, Oral, BID, Reubin Milan, MD, 100 mg at 30/16/01 0932   folic acid (FOLVITE) tablet 1 mg, 1 mg, Oral, Daily, Reubin Milan, MD, 1 mg at 06/28/22 0957   levothyroxine (SYNTHROID) tablet 112 mcg, 112 mcg, Oral, Daily, Reubin Milan, MD, 112 mcg at 06/29/22 0636   methocarbamol (ROBAXIN) tablet 500-1,000 mg, 500-1,000 mg, Oral, Q6H PRN, Nita Sells, MD, 1,000 mg at 06/28/22 1920   metoprolol succinate (TOPROL-XL) 24 hr tablet 25 mg, 25 mg, Oral, Daily, Reubin Milan, MD, 25 mg at 06/28/22 0957   mycophenolate (MYFORTIC) EC tablet 360 mg, 360 mg, Oral, BID, Reubin Milan, MD, 360 mg at 06/28/22 2152   ondansetron (ZOFRAN) tablet 4 mg, 4 mg, Oral, Q6H PRN **OR** ondansetron (ZOFRAN) injection 4 mg, 4 mg, Intravenous, Q6H PRN, Reubin Milan, MD, 4 mg at 06/25/22 1041   Oral care mouth rinse, 15 mL, Mouth Rinse, PRN, Hosie Poisson, MD   oxyCODONE (Oxy IR/ROXICODONE) immediate release tablet 5 mg, 5 mg, Oral,  Q3H PRN, Reubin Milan, MD, 5 mg at 06/28/22 2149   pantoprazole (PROTONIX) EC tablet 20 mg, 20 mg, Oral, BID, Reubin Milan, MD, 20 mg at 06/28/22 2150   polyethylene glycol (MIRALAX / GLYCOLAX) packet 17 g, 17 g, Oral, Daily PRN, Hosie Poisson, MD   polyvinyl alcohol (LIQUIFILM TEARS) 1.4 % ophthalmic solution 1 drop, 1 drop, Both Eyes, PRN, Verlon Au, Jai-Gurmukh, MD   predniSONE (DELTASONE) tablet 5 mg, 5 mg, Oral, Q breakfast, Hosie Poisson, MD, 5 mg at 06/28/22 0957    senna-docusate (Senokot-S) tablet 1 tablet, 1 tablet, Oral, QHS PRN, Hosie Poisson, MD   sorbitol 70 % solution 15 mL, 15 mL, Oral, Daily PRN, Hosie Poisson, MD   sulfamethoxazole-trimethoprim (BACTRIM DS) 800-160 MG per tablet 1 tablet, 1 tablet, Oral, Once per day on Mon Wed Fri, Akula, Vijaya, MD   umeclidinium bromide (INCRUSE ELLIPTA) 62.5 MCG/ACT 1 puff, 1 puff, Inhalation, Daily, Reubin Milan, MD, 1 puff at 06/29/22 2637    Family History  Problem Relation Age of Onset   Stroke Mother    Heart disease Father 87   Hemochromatosis Brother    Breast cancer Neg Hx     Social History   Socioeconomic History   Marital status: Married    Spouse name: Not on file   Number of children: Not on file   Years of education: Not on file   Highest education level: Not on file  Occupational History   Occupation: RETIRED    Employer: RETIRED  Tobacco Use   Smoking status: Former    Packs/day: 1.00    Years: 42.00    Total pack years: 42.00    Types: Cigarettes    Quit date: 06/27/1997    Years since quitting: 25.0    Passive exposure: Never   Smokeless tobacco: Never  Vaping Use   Vaping Use: Never used  Substance and Sexual Activity   Alcohol use: Yes    Comment: occ   Drug use: No   Sexual activity: Not on file  Other Topics Concern   Not on file  Social History Narrative   REGULAR EXERCISE   Social Determinants of Health   Financial Resource Strain: Not on file  Food Insecurity: No Food Insecurity (06/20/2022)   Hunger Vital Sign    Worried About Running Out of Food in the Last Year: Never true    Ran Out of Food in the Last Year: Never true  Transportation Needs: No Transportation Needs (06/20/2022)   PRAPARE - Hydrologist (Medical): No    Lack of Transportation (Non-Medical): No  Physical Activity: Not on file  Stress: Not on file  Social Connections: Not on file    Review of Systems: A 12 point ROS discussed and pertinent  positives are indicated in the HPI above.  All other systems are negative.  Review of Systems  Vital Signs: BP (!) 104/48 (BP Location: Left Arm)   Pulse (!) 55   Temp 97.9 F (36.6 C) (Oral)   Resp 18   Ht '5\' 3"'$  (1.6 m)   Wt 142 lb 3.2 oz (64.5 kg)   SpO2 97%   BMI 25.19 kg/m   Physical Exam Constitutional:      General: She is not in acute distress.    Appearance: She is not ill-appearing.  HENT:     Mouth/Throat:     Mouth: Mucous membranes are moist.     Pharynx: Oropharynx is clear.  Cardiovascular:     Rate and Rhythm: Normal rate and regular rhythm.     Heart sounds: Normal heart sounds.  Pulmonary:     Effort: Pulmonary effort is normal. No respiratory distress.     Breath sounds: Normal breath sounds.  Musculoskeletal:     Comments: Mild mid lumbar paravertebral tenderness.  Skin:    General: Skin is warm.  Neurological:     General: No focal deficit present.     Mental Status: She is alert and oriented to person, place, and time.  Psychiatric:        Mood and Affect: Mood normal.        Thought Content: Thought content normal.     Imaging: DG Abd 2 Views  Result Date: 06/26/2022 CLINICAL DATA:  Lower abdominal pain.  Constipation. EXAM: ABDOMEN - 2 VIEW COMPARISON:  None Available. FINDINGS: The bowel gas pattern is normal. There is no evidence of free air. No radio-opaque calculi or other significant radiographic abnormality is seen. IMPRESSION: Negative two view abdomen. Electronically Signed   By: San Morelle M.D.   On: 06/26/2022 12:43   MR LUMBAR SPINE WO CONTRAST  Result Date: 06/22/2022 CLINICAL DATA:  Initial evaluation for compression fracture. EXAM: MRI LUMBAR SPINE WITHOUT CONTRAST TECHNIQUE: Multiplanar, multisequence MR imaging of the lumbar spine was performed. No intravenous contrast was administered. COMPARISON:  Prior CT from 06/20/2022. FINDINGS: Segmentation: Standard. Lowest well-formed disc space labeled the L5-S1 level.  Alignment: Trace 3 mm degenerative anterolisthesis of L4 on L5. Associated trace retrolisthesis of L3 on L4. Alignment otherwise normal preservation of the normal lumbar lordosis. Underlying mild levoscoliosis. Vertebrae: Acute compression fracture involving the superior endplate of L2. Associated height loss measures up to 25% no more than trace 2 mm bony retropulsion. No significant stenosis. Additional mild chronic compression deformity of L3 noted. Vertebral body height otherwise maintained. Bone marrow signal intensity heterogeneous but overall within normal limits. No worrisome osseous lesions. No other abnormal marrow edema. Conus medullaris and cauda equina: Conus extends to the L1 level. Conus and cauda equina appear normal. Paraspinal and other soft tissues: Unremarkable. Disc levels: L1-2: Mild disc bulge, eccentric to the left. Trace 2 mm bony retropulsion related to the L2 compression fracture. Mild facet spurring. No spinal stenosis. Foramina remain patent. L2-3: Mild circumferential disc bulge with disc desiccation. Superimposed small right foraminal disc protrusion closely approximates the exiting right L2 nerve root without frank impingement (series 5, image 5). Mild facet hypertrophy. No spinal stenosis. Foramina remain patent. L3-4: Disc desiccation with mild disc bulge, asymmetric to the left. Associated central and left extraforaminal annular fissures. Mild facet hypertrophy. No significant spinal stenosis. Foramina remain patent. L4-5: Trace anterolisthesis. Disc desiccation with mild disc bulge. Moderate right with mild left facet hypertrophy. No significant spinal stenosis. Foramina remain patent. L5-S1: Disc desiccation with mild disc bulge. Superimposed left foraminal disc protrusion closely approximates the exiting left L5 nerve root (series 7, image 12). Moderate facet hypertrophy. No spinal stenosis. Mild left L5 foraminal narrowing. Right neural foramen remains patent. IMPRESSION: 1.  Acute compression fracture involving the superior endplate of L2 with up to 25% height loss and trace 2 mm bony retropulsion. No significant stenosis. 2. Mild chronic L3 compression fracture without stenosis. 3. Small right foraminal disc protrusion at L2-3, closely approximating and potentially irritating the exiting right L2 nerve root. 4. Left foraminal disc protrusion at L5-S1, potentially affecting the exiting left L5 nerve root. 5. Moderate bilateral facet hypertrophy at L4-5 and L5-S1.  Electronically Signed   By: Jeannine Boga M.D.   On: 06/22/2022 00:19   CT Angio Chest/Abd/Pel for Dissection W and/or Wo Contrast  Result Date: 06/20/2022 CLINICAL DATA:  Right lower back pain for 6 days. Pain extending to the abdomen this morning. EXAM: CT ANGIOGRAPHY CHEST, ABDOMEN AND PELVIS TECHNIQUE: Non-contrast CT of the chest was initially obtained. Multidetector CT imaging through the chest, abdomen and pelvis was performed using the standard protocol during bolus administration of intravenous contrast. Multiplanar reconstructed images and MIPs were obtained and reviewed to evaluate the vascular anatomy. RADIATION DOSE REDUCTION: This exam was performed according to the departmental dose-optimization program which includes automated exposure control, adjustment of the mA and/or kV according to patient size and/or use of iterative reconstruction technique. CONTRAST:  139m OMNIPAQUE IOHEXOL 350 MG/ML SOLN COMPARISON:  Unenhanced CT of the abdomen and pelvis and CT of the lumbar spine performed earlier today. FINDINGS: CTA CHEST FINDINGS Cardiovascular: Heart top-normal in size. No pericardial effusion. Mild three-vessel coronary artery calcifications. Great vessels are normal in caliber. Thoracic aorta mild atherosclerosis. No dissection. Arch branch vessels are widely patent. Mediastinum/Nodes: No neck base, mediastinal or hilar masses or enlarged lymph nodes. Trachea and esophagus are unremarkable.  Lungs/Pleura: Mild subpleural reticulation mostly in the dependent lower lobes. Advanced centrilobular emphysema. No lung mass or nodule. No consolidation to suggest pneumonia and no pulmonary edema. No pleural effusion or pneumothorax. Musculoskeletal: No fracture or acute finding. No bone lesion. No chest wall mass. Review of the MIP images confirms the above findings. CTA ABDOMEN AND PELVIS FINDINGS VASCULAR Aorta: Aorta normal in caliber. No dissection. Partly calcified atherosclerotic plaque, relatively mild. No penetrating ulcer. Celiac: Patent without evidence of aneurysm, dissection, vasculitis or significant stenosis. SMA: Patent without evidence of aneurysm, dissection, vasculitis or significant stenosis. Renals: Both renal arteries are patent without evidence of aneurysm, dissection, vasculitis, fibromuscular dysplasia or significant stenosis. IMA: Patent without evidence of aneurysm, dissection, vasculitis or significant stenosis. Inflow: Patent without evidence of aneurysm, dissection, vasculitis or significant stenosis. Veins: No obvious venous abnormality within the limitations of this arterial phase study. Review of the MIP images confirms the above findings. NON-VASCULAR Hepatobiliary: No focal liver abnormality is seen. No gallstones, gallbladder wall thickening, or biliary dilatation. Pancreas: Unremarkable. No pancreatic ductal dilatation or surrounding inflammatory changes. Spleen: Normal in size without focal abnormality. Adrenals/Urinary Tract: Adrenal glands are unremarkable. Kidneys are normal, without renal calculi, focal lesion, or hydronephrosis. Bladder is unremarkable. Stomach/Bowel: Normal stomach. Small bowel and colon are normal in caliber. No wall thickening. No inflammation. No evidence of appendicitis. Lymphatic: No enlarged lymph nodes. Reproductive: Status post hysterectomy. No adnexal masses. Other: No abdominal wall hernia or abnormality. No abdominopelvic ascites.  Musculoskeletal: Acute mild compression fracture of L2. Chronic mild compression fracture of L3. No other fractures. No bone lesions. Review of the MIP images confirms the above findings. IMPRESSION: CTA 1. No thoracoabdominal aortic dissection or aneurysm. 2. Mild aortic atherosclerosis. No penetrating ulcer or other findings to suggest acute aortic syndrome. 3. Aortic branch vessels are widely patent. NON CTA 1. Acute mild compression fracture of L2 as described on the earlier exams. 2. No other acute abnormality. 3. Lungs demonstrate subpleural reticulation that has progressed compared to the prior CT of the chest on 08/24/2021 consistent with progression of interstitial lung disease. 4. Advanced centrilobular emphysema. Electronically Signed   By: DLajean ManesM.D.   On: 06/20/2022 13:28   CT Renal Stone Study  Result Date: 06/20/2022 CLINICAL DATA:  Abdominal  pain. Low back pain for 6 days. Pain increased with movement. EXAM: CT ABDOMEN AND PELVIS WITHOUT CONTRAST TECHNIQUE: Multidetector CT imaging of the abdomen and pelvis was performed following the standard protocol without IV contrast. Multidetector CT imaging of the lumbar spine was performed without intravenous contrast administration. Multiplanar CT image reconstructions were also generated. RADIATION DOSE REDUCTION: This exam was performed according to the departmental dose-optimization program which includes automated exposure control, adjustment of the mA and/or kV according to patient size and/or use of iterative reconstruction technique. COMPARISON:  Lumbar MRI, 10/10/2021 FINDINGS: Lower chest: Interstitial fibrotic changes.  No acute findings. Hepatobiliary: No focal liver abnormality is seen. No gallstones, gallbladder wall thickening, or biliary dilatation. Pancreas: Unremarkable. No pancreatic ductal dilatation or surrounding inflammatory changes. Spleen: Normal in size without focal abnormality. Adrenals/Urinary Tract: Adrenal glands  are unremarkable. Kidneys are normal, without renal calculi, focal lesion, or hydronephrosis. Bladder is unremarkable. Stomach/Bowel: Normal stomach. Small bowel and colon are normal in caliber. No wall thickening. No inflammation. Scattered left colon diverticula. No diverticulitis. No evidence of appendicitis. Vascular/Lymphatic: Aortic atherosclerosis. No aneurysm. No enlarged lymph nodes. Reproductive: Status post hysterectomy. No adnexal masses. Other: No abdominal wall hernia or abnormality. No abdominopelvic ascites. Musculoskeletal: Recent/acute appearing mild compression fracture of L2. There is also mild compression fracture L3 of unclear chronicity. No other fractures. No bone lesions. CT LUMBAR SPINE FINDINGS Segmentation: 5 lumbar type vertebrae. Alignment: Mild levoscoliosis, apex at L2. Minor anterolisthesis, grade 1, L4 on L5. No other spondylolisthesis. Vertebrae: Mild compression fracture of the L2 vertebral body with depression of the upper endplate, which appears acute, and is new compared to the prior MRI. Mild compression fracture of L3, stable from the prior MRI, chronic. No other fractures. No bone lesions. Paraspinal and other soft tissues: Negative. Disc levels: Mild loss of disc height at L2-L3 and L4-L5. Moderate to marked right and mild left facet degenerative changes at L4-L5. No disc herniation. No significant central stenosis or lateral recess narrowing. IMPRESSION: CT ABDOMEN AND PELVIS 1. No acute, non skeletal findings. 2. Aortic atherosclerosis. CT LUMBAR SPINE 1. Acute mild compression fracture of L2. Fracture is not protrude into the spinal canal or neural foramina. No other acute fractures or acute findings. 2. Chronic mild compression fracture of L3. Electronically Signed   By: Lajean Manes M.D.   On: 06/20/2022 12:00   CT L-SPINE NO CHARGE  Result Date: 06/20/2022 CLINICAL DATA:  Abdominal pain. Low back pain for 6 days. Pain increased with movement. EXAM: CT ABDOMEN  AND PELVIS WITHOUT CONTRAST TECHNIQUE: Multidetector CT imaging of the abdomen and pelvis was performed following the standard protocol without IV contrast. Multidetector CT imaging of the lumbar spine was performed without intravenous contrast administration. Multiplanar CT image reconstructions were also generated. RADIATION DOSE REDUCTION: This exam was performed according to the departmental dose-optimization program which includes automated exposure control, adjustment of the mA and/or kV according to patient size and/or use of iterative reconstruction technique. COMPARISON:  Lumbar MRI, 10/10/2021 FINDINGS: Lower chest: Interstitial fibrotic changes.  No acute findings. Hepatobiliary: No focal liver abnormality is seen. No gallstones, gallbladder wall thickening, or biliary dilatation. Pancreas: Unremarkable. No pancreatic ductal dilatation or surrounding inflammatory changes. Spleen: Normal in size without focal abnormality. Adrenals/Urinary Tract: Adrenal glands are unremarkable. Kidneys are normal, without renal calculi, focal lesion, or hydronephrosis. Bladder is unremarkable. Stomach/Bowel: Normal stomach. Small bowel and colon are normal in caliber. No wall thickening. No inflammation. Scattered left colon diverticula. No diverticulitis. No evidence of  appendicitis. Vascular/Lymphatic: Aortic atherosclerosis. No aneurysm. No enlarged lymph nodes. Reproductive: Status post hysterectomy. No adnexal masses. Other: No abdominal wall hernia or abnormality. No abdominopelvic ascites. Musculoskeletal: Recent/acute appearing mild compression fracture of L2. There is also mild compression fracture L3 of unclear chronicity. No other fractures. No bone lesions. CT LUMBAR SPINE FINDINGS Segmentation: 5 lumbar type vertebrae. Alignment: Mild levoscoliosis, apex at L2. Minor anterolisthesis, grade 1, L4 on L5. No other spondylolisthesis. Vertebrae: Mild compression fracture of the L2 vertebral body with depression of  the upper endplate, which appears acute, and is new compared to the prior MRI. Mild compression fracture of L3, stable from the prior MRI, chronic. No other fractures. No bone lesions. Paraspinal and other soft tissues: Negative. Disc levels: Mild loss of disc height at L2-L3 and L4-L5. Moderate to marked right and mild left facet degenerative changes at L4-L5. No disc herniation. No significant central stenosis or lateral recess narrowing. IMPRESSION: CT ABDOMEN AND PELVIS 1. No acute, non skeletal findings. 2. Aortic atherosclerosis. CT LUMBAR SPINE 1. Acute mild compression fracture of L2. Fracture is not protrude into the spinal canal or neural foramina. No other acute fractures or acute findings. 2. Chronic mild compression fracture of L3. Electronically Signed   By: Lajean Manes M.D.   On: 06/20/2022 12:00    Labs:  CBC: Recent Labs    06/20/22 1030 06/21/22 0346 06/22/22 0459 06/26/22 0402  WBC 6.7 6.6 5.7 9.0  HGB 11.0* 10.2* 11.5* 12.3  HCT 35.6* 33.3* 37.5 40.4  PLT 264 238 266 266    COAGS: No results for input(s): "INR", "APTT" in the last 8760 hours.  BMP: Recent Labs    06/21/22 0346 06/22/22 0459 06/25/22 0459 06/26/22 0402  NA 140 138 138 138  K 3.3* 4.3 4.1 4.4  CL 105 103 106 106  CO2 '29 29 27 27  '$ GLUCOSE 87 87 96 96  BUN 6* 7* 8 10  CALCIUM 8.4* 8.6* 8.9 9.3  CREATININE 0.55 0.60 0.79 0.80  GFRNONAA >60 >60 >60 >60    LIVER FUNCTION TESTS: Recent Labs    07/23/21 1446 11/16/21 1022 04/18/22 1009 05/09/22 1407 06/20/22 1030 06/21/22 0346  BILITOT 0.4   < > 0.3 0.4 0.4 0.4  AST 13   < > 13 12* 13* 12*  ALT 9   < > '9 10 10 10  '$ ALKPHOS 43  --   --  56 58 53  PROT 6.4   < > 6.2 7.0 6.4* 5.2*  ALBUMIN 4.1  --   --  4.5 3.7 3.0*   < > = values in this interval not displayed.     Assessment and Plan: Acute symptomatic L2 compression fracture, most likely to underlying osteoporosis. Imaging reviewed, amenable to kyphoplasty procedure. Labs  reviewed, Eliquis has been held. Have received insurance authorization to proceed. Plan for procedure 1/4.  Risks and benefits of kyphoplasty were discussed with the patient including, but not limited to education regarding the natural healing process of compression fractures without intervention, bleeding, infection, cement migration which may cause spinal cord damage, paralysis, pulmonary embolism or even death.  This interventional procedure involves the use of X-rays and because of the nature of the planned procedure, it is possible that we will have prolonged use of X-ray fluoroscopy.  Potential radiation risks to you include (but are not limited to) the following: - A slightly elevated risk for cancer  several years later in life. This risk is typically less than 0.5% percent.  This risk is low in comparison to the normal incidence of human cancer, which is 33% for women and 50% for men according to the [employer]. - Radiation induced injury can include skin redness, resembling a rash, tissue breakdown / ulcers and hair loss (which can be temporary or permanent).   The likelihood of either of these occurring depends on the difficulty of the procedure and whether you are sensitive to radiation due to previous procedures, disease, or genetic conditions.   IF your procedure requires a prolonged use of radiation, you will be notified and given written instructions for further action.  It is your responsibility to monitor the irradiated area for the 2 weeks following the procedure and to notify your physician if you are concerned that you have suffered a radiation induced injury.    All of the patient's questions were answered, patient is agreeable to proceed.  Consent signed and in chart.    Electronically Signed: Ascencion Dike, PA-C 06/29/2022, 9:33 AM   I spent a total of 30 in face to face in clinical consultation, greater than 50% of which was counseling/coordinating  care for kyphoplasty

## 2022-06-29 NOTE — Progress Notes (Signed)
Physical Therapy Treatment Patient Details Name: [name] MRN: [id number] DOB: 09-Mar-1941 Today's Date: 06/29/2022   History of Present Illness Pt is an 82 yr old female presenting to [facility] ED with lower back and abdominal pain; Imaging showed chronic mild L3 compression fx with acute L2 compression fx. PMH: afib, chest pain, GERD, hypothyroidism.    PT Comments    Pt is progressing very well. Plan is for kyphoplasty tomorrow. Pt does demonstrate some higher level balance and strength deficits and may benefit from, OPPT f/u. May re-check pt after procedure if schedule allows. Pt has good family assist at home.  Recommendations for follow up therapy are one component of a multi-disciplinary discharge planning process, led by the attending physician.  Recommendations may be updated based on patient status, additional functional criteria and insurance authorization.  Follow Up Recommendations  Outpatient PT     Assistance Recommended at Discharge PRN  Patient can return home with the following A little help with bathing/dressing/bathroom;Help with stairs or ramp for entrance;Assist for transportation   Equipment Recommendations  None recommended by PT    Recommendations for Other Services       Precautions / Restrictions Precautions Precautions: Back Precaution Booklet Issued: No Precaution Comments: no BLT, use TSLO when out of bed, logroll Required Braces or Orthoses: Spinal Brace Spinal Brace: Thoracolumbosacral orthotic Restrictions Weight Bearing Restrictions: No     Mobility  Bed Mobility Overal bed mobility: Modified Independent     Sidelying to sit: Modified independent (Device/Increase time)       General bed mobility comments: Pt able to demonstrate log rolling technique, no assist    Transfers Overall transfer level: Modified independent Equipment used: None Transfers: Sit to/from Stand Sit to Stand: Modified independent (Device/Increase time)            General transfer comment: repeated STS x6 for strengthening with decr use UEs and slower speed    Ambulation/Gait Ambulation/Gait assistance: Supervision, Min guard Gait Distance (Feet): 400 Feet Assistive device: None Gait Pattern/deviations: Step-through pattern, Scissoring, Drifts right/left       General Gait Details: mildly unsteady gait with occasional scissoring. no overt LOB. min/guard to supervision for safety   Stairs Stairs: Yes Stairs assistance: Min guard, Supervision Stair Management: One rail Right, One rail Left, Alternating pattern, Forwards Number of Stairs: 3 General stair comments: cues for up with less painful LEs, step to pattern as needed--pt able to perform reciprocal pattern on ascent and descent   Wheelchair Mobility    Modified Rankin (Stroke Patients Only)       Balance                                            Cognition Arousal/Alertness: Awake/alert Behavior During Therapy: WFL for tasks assessed/performed Overall Cognitive Status: Within Functional Limits for tasks assessed                                          Exercises General Exercises - Lower Extremity Long Arc Quad: 15 reps, Both, Strengthening, Seated    General Comments        Pertinent Vitals/Pain Pain Assessment Pain Assessment: 0-10 Pain Score: 3  Pain Location: L hip/thigh Pain Descriptors / Indicators: Discomfort Pain Intervention(s): Limited activity within patient's tolerance, Monitored  during session    Home Living                          Prior Function            PT Goals (current goals can now be found in the care plan section) Acute Rehab PT Goals Patient Stated Goal: To reduce pain PT Goal Formulation: With patient Time For Goal Achievement: 07/07/22 Potential to Achieve Goals: Good Progress towards PT goals: Progressing toward goals    Frequency    Min 3X/week      PT Plan Current  plan remains appropriate    Co-evaluation              AM-PAC PT "6 Clicks" Mobility   Outcome Measure  Help needed turning from your back to your side while in a flat bed without using bedrails?: None Help needed moving from lying on your back to sitting on the side of a flat bed without using bedrails?: None Help needed moving to and from a bed to a chair (including a wheelchair)?: None Help needed standing up from a chair using your arms (e.g., wheelchair or bedside chair)?: None Help needed to walk in hospital room?: None Help needed climbing 3-5 steps with a railing? : A Little 6 Click Score: 23    End of Session Equipment Utilized During Treatment: Gait belt (declined TLSO) Activity Tolerance: Patient tolerated treatment well Patient left: with call bell/phone within reach;with family/visitor present;Other (comment) (standing with dtr present) Nurse Communication: Mobility status PT Visit Diagnosis: Difficulty in walking, not elsewhere classified (R26.2);Muscle weakness (generalized) (M62.81) Pain - Right/Left: Left Pain - part of body: Hip     Time: 7408-1448 PT Time Calculation (min) (ACUTE ONLY): 14 min  Charges:  $Gait Training: 8-22 mins                     Baxter Flattery, PT  Acute Rehab Dept [facility]) [phone]  [facility] Weekend Pager [facility] only)  [phone]  06/29/2022    [facility] 06/29/2022, 11:52 AM

## 2022-06-30 ENCOUNTER — Other Ambulatory Visit: Payer: Medicare Other

## 2022-06-30 ENCOUNTER — Inpatient Hospital Stay (HOSPITAL_COMMUNITY): Payer: Medicare Other

## 2022-06-30 DIAGNOSIS — M549 Dorsalgia, unspecified: Secondary | ICD-10-CM | POA: Diagnosis not present

## 2022-06-30 HISTORY — PX: IR KYPHO LUMBAR INC FX REDUCE BONE BX UNI/BIL CANNULATION INC/IMAGING: IMG5519

## 2022-06-30 MED ORDER — MIDAZOLAM HCL 2 MG/2ML IJ SOLN
INTRAMUSCULAR | Status: AC
  Filled 2022-06-30: qty 4

## 2022-06-30 MED ORDER — FENTANYL CITRATE (PF) 100 MCG/2ML IJ SOLN
INTRAMUSCULAR | Status: AC
  Filled 2022-06-30: qty 2

## 2022-06-30 MED ORDER — LIDOCAINE HCL (PF) 1 % IJ SOLN
INTRAMUSCULAR | Status: AC
  Administered 2022-06-30: 20 mL
  Filled 2022-06-30: qty 30

## 2022-06-30 MED ORDER — CEFAZOLIN SODIUM-DEXTROSE 2-4 GM/100ML-% IV SOLN
INTRAVENOUS | Status: AC
  Administered 2022-06-30: 2 g via INTRAVENOUS
  Filled 2022-06-30: qty 100

## 2022-06-30 MED ORDER — IOHEXOL 300 MG/ML  SOLN
50.0000 mL | Freq: Once | INTRAMUSCULAR | Status: AC | PRN
  Administered 2022-06-30: 20 mL

## 2022-06-30 MED ORDER — MIDAZOLAM HCL 2 MG/2ML IJ SOLN
INTRAMUSCULAR | Status: AC | PRN
  Administered 2022-06-30 (×2): .5 mg via INTRAVENOUS

## 2022-06-30 MED ORDER — FENTANYL CITRATE (PF) 100 MCG/2ML IJ SOLN
INTRAMUSCULAR | Status: AC | PRN
  Administered 2022-06-30 (×2): 50 ug via INTRAVENOUS
  Administered 2022-06-30 (×2): 25 ug via INTRAVENOUS

## 2022-06-30 NOTE — Care Management Important Message (Signed)
Important Message  Patient Details IM Letter given. Name: [name] MRN: [id number] Date of Birth: 07-May-1941   Medicare Important Message Given:  Yes     Kerin Salen 06/30/2022, 12:48 PM

## 2022-06-30 NOTE — Progress Notes (Addendum)
[facility]                                                                               [name], is a 82 y.o. female, DOB - Sep 04, 1940, [id number] Admit date - 06/20/2022    Outpatient Primary MD for the patient is Kathyrn Lass, MD  LOS - 9  days    Brief summary   82 y.o. female chronic atrial fibrillation, history of chest pain with negative Myoview stress test, depression with anxiety, GERD, hemochromatosis, iron deficiency anemia, history of syncopal episode, hypothyroidism admitted 12/25 with right-sided lower back abdominal pain for the past 6 days that was initially under control after she went to the urgent care was given analgesics and muscle relaxants. However on the day of admission the patient woke up with severe right-sided lumbar spine pain radiated to her right groin.    CT renal study show acute mild compression fracture. Chronic mild compression fracture of L3. No acute of the abdomen or pelvis. CTA chest/abdomen/pelvis no thoracoabdominal aortic dissection or aneurysm. There is mild aortic atherosclerosis. Aortic branch vessels widely patent. L2 fracture is also seen   IR consulted and she underwent kyphoplasty 1/4     Assessment & Plan    Assessment and Plan:   Intractable back pain secondary to L2 compression fracture Pain control with  oral oxycodone, and muscle relaxants.   IR performed kyphoplasty 4 th of January.  Eliquis on hold.  Pain control with IV Toradol and oral oxycodone, Cont Tylenol to scheduled to ensure some medication for pain is in system--will attempt to get patient up to chair again today Lidocaine ineffective Continue Robaxin but increase to [phone] every 6 as needed Kyphoplasty done today Await updated PT input   Chronic atrial fibrillation Continue with flecainide 100 mg twice daily  Continue with metoprolol. Eliquis remains on hold at this time   History of emphysema and pulmonary fibrosis Improved  not needing nasal cannula at this time discussed with daughter who is at bedside Desat screen when able   History of hypothyroidism Continue with Synthroid 112 mcg daily.   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 Will hold the iron supplementation briefly until she has a bm.  She is currently on senna, colace, Miralax .  Resolved.  Abdominal x ray does not show obstruction.   Mild malnutrition   Code Status: full code. DVT Prophylaxis:   apixaban (ELIQUIS) tablet 5 mg   Level of Care: Level of care: Med-Surg Family Communication: daughter at bedside.   Disposition Plan:     Remains inpatient appropriate:  kyphoplasty on Thursday.   Procedures:  Kyphoplasty.  Consultants:   IR consult.   Antimicrobials:    Medications  Scheduled Meds:  acetaminophen  650 mg Oral Q4H   [START ON 07/02/2022] apixaban  5 mg Oral BID   atorvastatin  10 mg Oral Daily   cholecalciferol  1,000 Units Oral Daily   feeding supplement  237 mL Oral Q24H   fentaNYL  flecainide  100 mg Oral BID   folic acid  1 mg Oral Daily   levothyroxine  112 mcg Oral Daily   lidocaine (PF)       metoprolol succinate  25 mg Oral Daily   midazolam       mycophenolate  360 mg Oral BID   pantoprazole  20 mg Oral BID   predniSONE  5 mg Oral Q breakfast   sulfamethoxazole-trimethoprim  1 tablet Oral Once per day on Mon Wed Fri   umeclidinium bromide  1 puff Inhalation Daily   Continuous Infusions:   ceFAZolin (ANCEF) IV 2 g (06/30/22 0830)   PRN Meds:.alum & mag hydroxide-simeth **AND** lidocaine, diphenhydrAMINE-zinc acetate, fentaNYL, fentaNYL, iohexol, lidocaine (PF), methocarbamol, midazolam, midazolam, ondansetron **OR** ondansetron (ZOFRAN) IV, mouth rinse, oxyCODONE, polyethylene glycol,  polyvinyl alcohol, senna-docusate, sorbitol    Subjective:   Fair  Back from kypho No chest pain fever chills  Objective:   Vitals:   06/30/22 0830 06/30/22 0835 06/30/22 0840 06/30/22 0845  BP: 129/67 122/64 120/60 127/66  Pulse:    (!) 59  Resp: '16 14 12 15  '$ Temp:      TempSrc:      SpO2:    100%  Weight:      Height:        Intake/Output Summary (Last 24 hours) at 06/30/2022 0851 Last data filed at 06/30/2022 0529 Gross per 24 hour  Intake 300 ml  Output --  Net 300 ml    Filed Weights   06/20/22 0946 06/20/22 2300  Weight: 62.6 kg 64.5 kg     Exam Awake coherent in nad no focal deficit Cta b no added sound Abd soft nt non distended No rebound Rom intact no focal deficit Able to SLR without much discomfort  Data Reviewed:  I have personally reviewed following labs and imaging studies   CBC Lab Results  Component Value Date   WBC 9.0 06/26/2022   RBC 3.94 06/26/2022   HGB 12.3 06/26/2022   HCT 40.4 06/26/2022   MCV 102.5 (H) 06/26/2022   MCH 31.2 06/26/2022   PLT 266 06/26/2022   MCHC 30.4 06/26/2022   RDW 19.9 (H) 06/26/2022   LYMPHSABS 1.8 06/26/2022   MONOABS 0.9 06/26/2022   EOSABS 0.4 06/26/2022   BASOSABS 0.1 80/99/8338     Last metabolic panel Lab Results  Component Value Date   NA 138 06/26/2022   K 4.4 06/26/2022   CL 106 06/26/2022   CO2 27 06/26/2022   BUN 10 06/26/2022   CREATININE 0.80 06/26/2022   GLUCOSE 96 06/26/2022   GFRNONAA >60 06/26/2022   GFRAA 101 06/09/2020   CALCIUM 9.3 06/26/2022   PHOS 3.1 05/20/2013   PROT 5.2 (L) 06/21/2022   ALBUMIN 3.0 (L) 06/21/2022   LABGLOB 2.4 10/10/2016   AGRATIO 1.8 10/10/2016   BILITOT 0.4 06/21/2022   ALKPHOS 53 06/21/2022   AST 12 (L) 06/21/2022   ALT 10 06/21/2022   ANIONGAP 5 06/26/2022    CBG (last 3)  No results for input(s): "GLUCAP" in the last 72 hours.    Coagulation Profile: No results for input(s): "INR", "PROTIME" in the last 168 hours.   Radiology  Studies: No results found.     Nita Sells M.D. [facility] 06/30/2022, 8:51 AM  Available via Epic secure chat 7am-7pm After 7 pm, please refer to night coverage provider listed on amion.

## 2022-06-30 NOTE — Progress Notes (Signed)
Occupational Therapy Treatment Patient Details Name: [name] MRN: [id number] DOB: 01/16/1941 Today's Date: 06/30/2022   History of present illness Pt is an 82 yr old female presenting to [facility] ED with lower back and abdominal pain; Imaging showed chronic mild L3 compression fx with acute L2 compression fx. She is s/p a kyphoplasty on 06-30-2022. PMH: afib, chest pain, GERD, hypothyroidism.   OT comments  The patient performed all tasks with supervision or better, including lower body dressing, sit to stand, upper body dressing, toileting at bathroom level, and grooming standing at the sink. She denied having low back pain, however reported feelings of stiffness. OT educated her and her daughter on general safety considerations and recommendations for performing ADL tasks in the home, as the pt may discharge home tomorrow. They presented with good understanding, recall, and teach back abilities. All needed OT education and/or intervention(s) were provided & she has met her OT goals. She does not require further OT services, therefore OT will sign off.    Recommendations for follow up therapy are one component of a multi-disciplinary discharge planning process, led by the attending physician.  Recommendations may be updated based on patient status, additional functional criteria and insurance authorization.    Follow Up Recommendations  No OT follow up     Assistance Recommended at Discharge PRN  Patient can return home with the following  Assistance with cooking/housework;Assist for transportation   Equipment Recommendations  None recommended by OT       Precautions / Restrictions Restrictions Weight Bearing Restrictions: No       Mobility Bed Mobility Overal bed mobility: Modified Independent       Supine to sit: Modified independent (Device/Increase time)          Transfers   Equipment used: None Transfers: Sit to/from Stand Sit to Stand: Supervision                      ADL either performed or assessed with clinical judgement   ADL   Eating/Feeding: Independent   Grooming: Set up;Standing Grooming Details (indicate cue type and reason): She performed hand washing standing at the sink.         Upper Body Dressing : Set up;Standing Upper Body Dressing Details (indicate cue type and reason): She donned an overhead hospital gown & house coat in standing. Lower Body Dressing: Set up;Sit to/from stand Lower Body Dressing Details (indicate cue type and reason): She donned her slippers seated EOB. Toilet Transfer: Supervision/safety;Ambulation Toilet Transfer Details (indicate cue type and reason): She ambulated to the bathroom to perform transfer; no AD required. Toileting- Clothing Manipulation and Hygiene: Supervision/safety;Sit to/from stand Toileting - Clothing Manipulation Details (indicate cue type and reason): She performed at bathroom level.              Cognition Arousal/Alertness: Awake/alert Behavior During Therapy: WFL for tasks assessed/performed Overall Cognitive Status: Within Functional Limits for tasks assessed          General Comments: Oriented x4, able to follow commands, friendly                   Pertinent Vitals/ Pain       Pain Assessment Pain Location: She denied having pain, however reported having low back stiffness.         Frequency   (N/A)        Progress Toward Goals  OT Goals(current goals can now be found in the care plan section)  Progress towards OT goals: Goals met/education completed, patient discharged from OT  Acute Rehab OT Goals OT Goal Formulation: With patient/family ADL Goals Pt Will Perform Grooming: with supervision;standing Pt Will Perform Lower Body Dressing: with supervision;sit to/from stand Pt Will Transfer to Toilet: with supervision;ambulating Pt Will Perform Toileting - Clothing Manipulation and hygiene: sit to/from stand;with supervision  Plan Discharge  plan needs to be updated       AM-PAC OT "6 Clicks" Daily Activity     Outcome Measure   Help from another person eating meals?: None Help from another person taking care of personal grooming?: None Help from another person toileting, which includes using toliet, bedpan, or urinal?: None Help from another person bathing (including washing, rinsing, drying)?: A Little Help from another person to put on and taking off regular upper body clothing?: None Help from another person to put on and taking off regular lower body clothing?: None 6 Click Score: 23    End of Session    OT Visit Diagnosis: Muscle weakness (generalized) (M62.81)   Activity Tolerance Patient tolerated treatment well   Patient Left in chair;with call bell/phone within reach;with family/visitor present   Nurse Communication Mobility status        Time: 8101-7510 OT Time Calculation (min): 13 min  Charges: OT General Charges $OT Visit: 1 Visit OT Treatments $Self Care/Home Management : 8-22 mins     Leota Sauers, OTR/L 06/30/2022, 2:40 PM

## 2022-06-30 NOTE — Procedures (Signed)
Interventional Radiology Procedure Note  Procedure: L2 kyphoplasty  Findings: Please refer to procedural dictation for full description. Bipedicular approach.  Complications: None immediate  Estimated Blood Loss: < 5 mL  Recommendations: 3 hour bedrest. IR will follow.   Ruthann Cancer, MD

## 2022-07-01 ENCOUNTER — Other Ambulatory Visit (HOSPITAL_COMMUNITY): Payer: Self-pay | Admitting: Physician Assistant

## 2022-07-01 DIAGNOSIS — M549 Dorsalgia, unspecified: Secondary | ICD-10-CM | POA: Diagnosis not present

## 2022-07-01 DIAGNOSIS — Z8781 Personal history of (healed) traumatic fracture: Secondary | ICD-10-CM

## 2022-07-01 LAB — BASIC METABOLIC PANEL
Anion gap: 7 (ref 5–15)
BUN: 9 mg/dL (ref 8–23)
CO2: 25 mmol/L (ref 22–32)
Calcium: 8.9 mg/dL (ref 8.9–10.3)
Chloride: 106 mmol/L (ref 98–111)
Creatinine, Ser: 0.57 mg/dL (ref 0.44–1.00)
GFR, Estimated: >60 mL/min (ref >60)
Glucose, Bld: 97 mg/dL (ref 70–99)
Potassium: 3.9 mmol/L (ref 3.5–5.1)
Sodium: 138 mmol/L (ref 135–145)

## 2022-07-01 MED ORDER — SENNOSIDES-DOCUSATE SODIUM 8.6-50 MG PO TABS: 1.0000 | ORAL_TABLET | Freq: Every evening | ORAL | 0 refills | Status: DC | PRN

## 2022-07-01 MED ORDER — POLYETHYLENE GLYCOL 3350 17 G PO PACK: 17.0000 g | PACK | Freq: Every day | ORAL | 0 refills | Status: DC | PRN

## 2022-07-01 MED ORDER — METHOCARBAMOL 500 MG PO TABS: 500.0000 mg | ORAL_TABLET | Freq: Four times a day (QID) | ORAL | 0 refills | Status: DC | PRN

## 2022-07-01 MED ORDER — OXYCODONE HCL 5 MG PO TABS: 5.0000 mg | ORAL_TABLET | ORAL | 0 refills | Status: DC | PRN

## 2022-07-01 MED ORDER — SORBITOL 70 % SOLN: 15.0000 mL | Freq: Every day | 0 refills | Status: DC | PRN

## 2022-07-01 MED ORDER — UMECLIDINIUM BROMIDE 62.5 MCG/ACT IN AEPB: 1.0000 | INHALATION_SPRAY | Freq: Every day | RESPIRATORY_TRACT | 2 refills | Status: DC

## 2022-07-01 NOTE — Plan of Care (Signed)
  Problem: Clinical Measurements: Goal: Ability to maintain clinical measurements within normal limits will improve Outcome: Progressing   Problem: Activity: Goal: Risk for activity intolerance will decrease Outcome: Progressing   Problem: Pain Managment: Goal: General experience of comfort will improve Outcome: Progressing   Problem: Safety: Goal: Ability to remain free from injury will improve Outcome: Progressing   

## 2022-07-01 NOTE — TOC Transition Note (Signed)
Transition of Care [facility]) - CM/SW Discharge Note   Patient Details  Name: [name] MRN: [id number] Date of Birth: [birth year]  Transition of [facility] CM/SW Contact:  Lennart Pall, LCSW Phone Number: 07/01/2022, 10:49 AM   Clinical Narrative:     Pt medically cleared for dc today and no therapy follow up recommended/ pt agreeable with this.  Pt has needed DME.  No TOC needs for dc.  Final next level of care: Home/Self Care Barriers to Discharge: Barriers Resolved   Patient Goals and CMS Choice CMS Medicare.gov Compare Post Acute Care list provided to:: Patient Choice offered to / list presented to : Patient  Discharge Placement                         Discharge Plan and Services Additional resources added to the After Visit Summary for     Discharge Planning Services: CM Consult            DME Arranged: N/A DME Agency: NA                  Social Determinants of Health ([place]) Interventions [place]: No Food Insecurity (06/20/2022)  Housing: Low Risk  (06/20/2022)  Transportation Needs: No Transportation Needs (06/20/2022)  Utilities: Not At Risk (06/20/2022)  Depression (PHQ2-9): Low Risk  (08/21/2018)  Tobacco Use: Medium Risk (06/30/2022)     Readmission Risk Interventions     No data to display

## 2022-07-01 NOTE — Plan of Care (Signed)
Pt is ready to DC home with daughter

## 2022-07-01 NOTE — Progress Notes (Signed)
    Ms. Pipkins is 1 day s/p L2 KP with Dr. Serafina Royals.  Patient reports feeling "so much better" since the [place].  She has been able to dress, stand, toilet, and groom independently without back pain.   d/c home today prior to rounding and evaluation was accomplished by phone.  We discussed dressing changes and anticipated follow up appointment in 2 weeks with Dr. Serafina Royals.  All questions answered satisfactorily.  Electronically Signed: Pasty Spillers, PA 07/01/2022, 1:24 PM

## 2022-07-01 NOTE — Discharge Summary (Signed)
Physician Discharge Summary  [name] WUJ:[id number] DOB: 1940-12-28 DOA: 06/20/2022  PCP: Kathyrn Lass, MD  Admit date: 06/20/2022 Discharge date: 07/01/2022  Time spent: 36 minutes  Recommendations for Outpatient Follow-up:  Need labs cbc/bmet in 1 week OP follow as per IR Will need PCP f/u 1 weeks time  Discharge Diagnoses:  MAIN problem for hospitalization   Intractable LBP with L2 compression frcture Chr Atrial fibrillation Pulmonary fibrosis Autoimmune disorder Rash constipation  Please see below for itemized issues addressed in HOpsital- refer to other progress notes for clarity if needed  Discharge Condition: improved  Diet recommendation: [facility]  Filed Weights   06/20/22 0946 06/20/22 2300  Weight: 62.6 kg 64.5 kg    History of present illness:  82 y.o. female chronic atrial fibrillation, history of chest pain with negative Myoview stress test, depression with anxiety, GERD, hemochromatosis, iron deficiency anemia, history of syncopal episode, hypothyroidism admitted 12/25 with right-sided lower back abdominal pain for the past 6 days that was initially under control after she went to the urgent care was given analgesics and muscle relaxants. However on the day of admission the patient woke up with severe right-sided lumbar spine pain radiated to her right groin.    CT renal study show acute mild compression fracture. Chronic mild compression fracture of L3. No acute of the abdomen or pelvis. CTA chest/abdomen/pelvis no thoracoabdominal aortic dissection or aneurysm. There is mild aortic atherosclerosis. Aortic branch vessels widely patent. L2 fracture is also seen    IR consulted and she underwent kyphoplasty 1/4  Hospital Course:  Intractable back pain secondary to L2 compression fracture Pain control with  oral oxycodone, and muscle relaxants.   IR performed kyphoplasty 4 th of January.  Eliquis on hold.  Pain control with IV Toradol and oral  oxycodone, Tylenol to scheduled to ensure some medication for pain is in system- Continue Robaxin [phone] every 6 as needed Kyphoplasty done today and she has improved Wants home with [facility]   Chronic atrial fibrillation Continue with flecainide 100 mg twice daily  Continue with metoprolol. Eliquis resumed at d/c   History of emphysema and pulmonary fibrosis Improved not needing nasal cannula at this time discussed with daughter who is at bedside No oxygen needs on discharge   History of hypothyroidism Continue with Synthroid 112 mcg daily.   Auto immune disorder with Scl 70 Antibody positive Continue with mycophenolate   Hypokalemia Replaced and stabilized   Rash-likely secondary to likely bed linene Benadryl cream-has improved   Constipation: probably from oxy and iron supplements. Held iron supplementation briefly until she has a bm.  She is currently on senna, colace, Miralax .  Resolved.  Abdominal x ray does not show obstruction this is overall resolved  Mild malnutrition-outpatient   Discharge Exam: Vitals:   07/01/22 0054 07/01/22 0642  BP: (!) 103/57 110/67  Pulse: (!) 59 60  Resp: 16 17  Temp: (!) 97.5 F (36.4 C) 97.8 F (36.6 C)  SpO2: 91% 97%    Subj on day of d/c   Pleasant awake coherent has been up out of the bed with no distress Eating and drinking passing regular stool  General Exam on discharge  EOMI NCAT no icterus no pallor pleasant coherent CTAB no added sound no rales no rhonchi no wheeze ROM intact moving 4 limbs  S1-S2 no murmur   Discharge Instructions   Discharge Instructions     Diet - low sodium heart healthy   Complete by: As directed  Discharge instructions   Complete by: As directed    Make sure you take your usual medications as directed but take Robaxin at a slightly higher dose we have prescribed you oxycodone as well and called into the [facility] drugstore for you Go slowly you are not supposed to drive for 1  week We will get home health physical therapy to come out and help you with mobility at your request Make sure that you take the medication for constipation as well   Increase activity slowly   Complete by: As directed       Allergies as of 07/01/2022   No Known Allergies      Medication List     STOP taking these medications    amoxicillin-clavulanate 500-125 MG tablet Commonly known as: AUGMENTIN   doxycycline 100 MG tablet Commonly known as: VIBRA-TABS   ferrous sulfate 325 (65 FE) MG EC tablet   folic acid 1 MG tablet Commonly known as: FOLVITE   oxyCODONE-acetaminophen 5-325 MG tablet Commonly known as: PERCOCET/ROXICET   VITAMIN B 12 PO       TAKE these medications    acetaminophen 325 MG tablet Commonly known as: TYLENOL Take 650 mg by mouth every 6 (six) hours as needed for mild pain or headache.   apixaban 5 MG Tabs tablet Commonly known as: Eliquis TAKE 1 TABLET(5 MG) BY MOUTH TWICE DAILY What changed:  how much to take how to take this when to take this additional instructions   atorvastatin 10 MG tablet Commonly known as: LIPITOR Take 10 mg by mouth daily.   benzonatate 100 MG capsule Commonly known as: TESSALON Take 100 mg by mouth 3 (three) times daily as needed for cough.   flecainide 100 MG tablet Commonly known as: TAMBOCOR Take 1 tablet (100 mg total) by mouth 2 (two) times daily.   levothyroxine 112 MCG tablet Commonly known as: SYNTHROID Take 112 mcg by mouth daily.   methocarbamol 500 MG tablet Commonly known as: ROBAXIN Take 1-2 tablets (500-1,000 mg total) by mouth every 6 (six) hours as needed for muscle spasms. What changed:  how much to take when to take this   metoprolol succinate 25 MG 24 hr tablet Commonly known as: TOPROL-XL TAKE 1 TABLET BY MOUTH  DAILY   mycophenolate 360 MG Tbec EC tablet Commonly known as: MYFORTIC TAKE 1 TABLET BY MOUTH TWICE DAILY   nitroGLYCERIN 2 % ointment Commonly known as:  NITROGLYN Apply 1/4 inch to fingertips three times daily as needed. What changed:  how much to take how to take this when to take this reasons to take this additional instructions   ondansetron 4 MG disintegrating tablet Commonly known as: Zofran ODT Take 1-2 tablets three times daily as needed What changed:  how much to take how to take this when to take this reasons to take this additional instructions   oxyCODONE 5 MG immediate release tablet Commonly known as: Oxy IR/ROXICODONE Take 1 tablet (5 mg total) by mouth every 3 (three) hours as needed for moderate pain or breakthrough pain.   pantoprazole 20 MG tablet Commonly known as: PROTONIX Take 20 mg by mouth 2 (two) times daily.   polyethylene glycol 17 g packet Commonly known as: MIRALAX / GLYCOLAX Take 17 g by mouth daily as needed for moderate constipation.   predniSONE 5 MG tablet Commonly known as: DELTASONE Take 5 mg by mouth daily with breakfast. What changed: Another medication with the same name was removed. Continue taking this medication,  and follow the directions you see here.   senna-docusate 8.6-50 MG tablet Commonly known as: Senokot-S Take 1 tablet by mouth at bedtime as needed for mild constipation.   sorbitol 70 % Soln Take 15 mLs by mouth daily as needed for moderate constipation.   Spiriva Respimat 2.5 MCG/ACT Aers Generic drug: Tiotropium Bromide Monohydrate INHALE 2 PUFFS INTO THE LUNGS DAILY   sulfamethoxazole-trimethoprim 800-160 MG tablet Commonly known as: BACTRIM DS TAKE 1 TABLET BY MOUTH ON MONDAY, WEDNESDAY, AND FRIDAY What changed: See the new instructions.   umeclidinium bromide 62.5 MCG/ACT Aepb Commonly known as: INCRUSE ELLIPTA Inhale 1 puff into the lungs daily. Start taking on: July 02, 2022   Vitamin D (Cholecalciferol) 25 MCG (1000 UT) Tabs Take 1 tablet by mouth daily.       No Known Allergies  Follow-up Information     Ashok Pall, MD.   Specialty:  Neurosurgery Contact information: [address] [place] [address] [phone]                  The results of significant diagnostics from this hospitalization (including imaging, microbiology, ancillary and laboratory) are listed below for reference.    Significant Diagnostic Studies: IR KYPHO LUMBAR INC FX REDUCE BONE BX UNI/BIL CANNULATION INC/IMAGING  Result Date: 06/30/2022 CLINICAL DATA:  82 year old female with acute, symptomatic osteoporotic L2 superior endplate compression fracture. EXAM: FLUOROSCOPIC GUIDED KYPHOPLASTY OF THE L2 VERTEBRAL BODY COMPARISON:  None Available. MEDICATIONS: As antibiotic prophylaxis, Ancef 2 gm IV was ordered pre-procedure and administered intravenously within 1 hour of incision. ANESTHESIA/SEDATION: Moderate (conscious) sedation was employed during this procedure. A total of Versed 2 mg and Fentanyl 200 mcg was administered intravenously. Moderate Sedation Time: 36 minutes. The patient's level of consciousness and vital signs were monitored continuously by radiology nursing throughout the procedure under my direct supervision. FLUOROSCOPY TIME:  426 mGy COMPLICATIONS: None immediate. PROCEDURE: The procedure, risks (including but not limited to bleeding, infection, organ damage), benefits, and alternatives were explained to the patient. Questions regarding the procedure were encouraged and answered. The patient understands and consents to the procedure. The patient has suffered a fracture of the L2 superior endplate. It is recommended that patients aged 70 years or older be evaluated for possible testing or treatment of osteoporosis. A copy of this procedure report is sent to the patient's referring physician The patient was placed prone on the fluoroscopic table. The skin overlying the upper lumbar region was then prepped and draped in the usual sterile fashion. Maximal barrier sterile technique was utilized including caps, mask,  sterile gowns, sterile gloves, sterile drape, hand hygiene and skin antiseptic. Intravenous Fentanyl and Versed were administered as conscious sedation during continuous cardiorespiratory monitoring by the radiology RN. The left pedicle at L2 was then infiltrated with 1% lidocaine followed by the advancement of a Kyphon trocar needle through the left pedicle into the posterior one-third of the vertebral body. Subsequently, the osteo drill was advanced to the anterior third of the vertebral body. The osteo drill was retracted. Through the working cannula, a Kyphon inflatable bone tamp 15 x 3 was advanced and positioned with the distal marker approximately 5 mm from the anterior aspect of the cortex. Appropriate positioning was confirmed on the AP projection. At this time, the balloon was expanded using contrast via a Kyphon inflation syringe device via micro tubing. In similar fashion, the right L2 pedicle was infiltrated with 1% lidocaine followed by the advancement of a second Kyphon trocar  needle through the right pedicle into the posterior third of the vertebral body. Subsequently, the osteo drill was coaxially advanced to the anterior right third. The osteo drill was exchanged for a Kyphon inflatable bone tamp 15 x 3, advanced to the 5 mm of the anterior aspect of the cortex. The balloon was then expanded using contrast as above. Inflations were continued until there was near apposition with the superior end plate. At this time, methylmethacrylate mixture was reconstituted in the Kyphon bone mixing device system. This was then loaded into the delivery mechanism, attached to Kyphon bone fillers. The balloons were deflated and removed followed by the instillation of methylmethacrylate mixture with excellent filling in the AP and lateral projections. No extravasation was noted in the disk spaces or posteriorly into the spinal canal. No epidural venous contamination was seen. The working cannulae and the bone filler  were then retrieved and removed. Hemostasis was achieved with manual compression. The patient tolerated the procedure well without immediate postprocedural complication. IMPRESSION: 1. Technically successful L2 vertebral body augmentation using balloon kyphoplasty. 2. Per CMS PQRS reporting requirements (PQRS Measure 24): Given the patient's age of greater than 49 and the fracture site (hip, distal radius, or spine), the patient should be tested for osteoporosis using DXA, and the appropriate treatment considered based on the DXA results. PLAN: Follow-up in 2-3 weeks interventional radiology clinic. Ruthann Cancer, MD [facility] Radiology Electronically Signed   By: Ruthann Cancer M.D.   On: 06/30/2022 12:54   DG Abd 2 Views  Result Date: 06/26/2022 CLINICAL DATA:  Lower abdominal pain.  Constipation. EXAM: ABDOMEN - 2 VIEW COMPARISON:  None Available. FINDINGS: The bowel gas pattern is normal. There is no evidence of free air. No radio-opaque calculi or other significant radiographic abnormality is seen. IMPRESSION: Negative two view abdomen. Electronically Signed   By: San Morelle M.D.   On: 06/26/2022 12:43   MR LUMBAR SPINE WO CONTRAST  Result Date: 06/22/2022 CLINICAL DATA:  Initial evaluation for compression fracture. EXAM: MRI LUMBAR SPINE WITHOUT CONTRAST TECHNIQUE: Multiplanar, multisequence MR imaging of the lumbar spine was performed. No intravenous contrast was administered. COMPARISON:  Prior CT from 06/20/2022. FINDINGS: Segmentation: Standard. Lowest well-formed disc space labeled the L5-S1 level. Alignment: Trace 3 mm degenerative anterolisthesis of L4 on L5. Associated trace retrolisthesis of L3 on L4. Alignment otherwise normal preservation of the normal lumbar lordosis. Underlying mild levoscoliosis. Vertebrae: Acute compression fracture involving the superior endplate of L2. Associated height loss measures up to 25% no more than  trace 2 mm bony retropulsion. No significant stenosis. Additional mild chronic compression deformity of L3 noted. Vertebral body height otherwise maintained. Bone marrow signal intensity heterogeneous but overall within normal limits. No worrisome osseous lesions. No other abnormal marrow edema. Conus medullaris and cauda equina: Conus extends to the L1 level. Conus and cauda equina appear normal. Paraspinal and other soft tissues: Unremarkable. Disc levels: L1-2: Mild disc bulge, eccentric to the left. Trace 2 mm bony retropulsion related to the L2 compression fracture. Mild facet spurring. No spinal stenosis. Foramina remain patent. L2-3: Mild circumferential disc bulge with disc desiccation. Superimposed small right foraminal disc protrusion closely approximates the exiting right L2 nerve root without frank impingement (series 5, image 5). Mild facet hypertrophy. No spinal stenosis. Foramina remain patent. L3-4: Disc desiccation with mild disc bulge, asymmetric to the left. Associated central and left extraforaminal annular fissures. Mild facet hypertrophy. No significant spinal stenosis. Foramina remain patent. L4-5: Trace anterolisthesis. Disc  desiccation with mild disc bulge. Moderate right with mild left facet hypertrophy. No significant spinal stenosis. Foramina remain patent. L5-S1: Disc desiccation with mild disc bulge. Superimposed left foraminal disc protrusion closely approximates the exiting left L5 nerve root (series 7, image 12). Moderate facet hypertrophy. No spinal stenosis. Mild left L5 foraminal narrowing. Right neural foramen remains patent. IMPRESSION: 1. Acute compression fracture involving the superior endplate of L2 with up to 25% height loss and trace 2 mm bony retropulsion. No significant stenosis. 2. Mild chronic L3 compression fracture without stenosis. 3. Small right foraminal disc protrusion at L2-3, closely approximating and potentially irritating the exiting right L2 nerve root. 4.  Left foraminal disc protrusion at L5-S1, potentially affecting the exiting left L5 nerve root. 5. Moderate bilateral facet hypertrophy at L4-5 and L5-S1. Electronically Signed   By: Jeannine Boga M.D.   On: 06/22/2022 00:19   CT Angio Chest/Abd/Pel for Dissection W and/or Wo Contrast  Result Date: 06/20/2022 CLINICAL DATA:  Right lower back pain for 6 days. Pain extending to the abdomen this morning. EXAM: CT ANGIOGRAPHY CHEST, ABDOMEN AND PELVIS TECHNIQUE: Non-contrast CT of the chest was initially obtained. Multidetector CT imaging through the chest, abdomen and pelvis was performed using the standard protocol during bolus administration of intravenous contrast. Multiplanar reconstructed images and MIPs were obtained and reviewed to evaluate the vascular anatomy. RADIATION DOSE REDUCTION: This exam was performed according to the departmental dose-optimization program which includes automated exposure control, adjustment of the mA and/or kV according to patient size and/or use of iterative reconstruction technique. CONTRAST:  156m OMNIPAQUE IOHEXOL 350 MG/ML SOLN COMPARISON:  Unenhanced CT of the abdomen and pelvis and CT of the lumbar spine performed earlier today. FINDINGS: CTA CHEST FINDINGS Cardiovascular: Heart top-normal in size. No pericardial effusion. Mild three-vessel coronary artery calcifications. Great vessels are normal in caliber. Thoracic aorta mild atherosclerosis. No dissection. Arch branch vessels are widely patent. Mediastinum/Nodes: No neck base, mediastinal or hilar masses or enlarged lymph nodes. Trachea and esophagus are unremarkable. Lungs/Pleura: Mild subpleural reticulation mostly in the dependent lower lobes. Advanced centrilobular emphysema. No lung mass or nodule. No consolidation to suggest pneumonia and no pulmonary edema. No pleural effusion or pneumothorax. Musculoskeletal: No fracture or acute finding. No bone lesion. No chest wall mass. Review of the MIP images  confirms the above findings. CTA ABDOMEN AND PELVIS FINDINGS VASCULAR Aorta: Aorta normal in caliber. No dissection. Partly calcified atherosclerotic plaque, relatively mild. No penetrating ulcer. Celiac: Patent without evidence of aneurysm, dissection, vasculitis or significant stenosis. SMA: Patent without evidence of aneurysm, dissection, vasculitis or significant stenosis. Renals: Both renal arteries are patent without evidence of aneurysm, dissection, vasculitis, fibromuscular dysplasia or significant stenosis. IMA: Patent without evidence of aneurysm, dissection, vasculitis or significant stenosis. Inflow: Patent without evidence of aneurysm, dissection, vasculitis or significant stenosis. Veins: No obvious venous abnormality within the limitations of this arterial phase study. Review of the MIP images confirms the above findings. NON-VASCULAR Hepatobiliary: No focal liver abnormality is seen. No gallstones, gallbladder wall thickening, or biliary dilatation. Pancreas: Unremarkable. No pancreatic ductal dilatation or surrounding inflammatory changes. Spleen: Normal in size without focal abnormality. Adrenals/Urinary Tract: Adrenal glands are unremarkable. Kidneys are normal, without renal calculi, focal lesion, or hydronephrosis. Bladder is unremarkable. Stomach/Bowel: Normal stomach. Small bowel and colon are normal in caliber. No wall thickening. No inflammation. No evidence of appendicitis. Lymphatic: No enlarged lymph nodes. Reproductive: Status post hysterectomy. No adnexal masses. Other: No abdominal wall hernia or abnormality. No abdominopelvic ascites.  Musculoskeletal: Acute mild compression fracture of L2. Chronic mild compression fracture of L3. No other fractures. No bone lesions. Review of the MIP images confirms the above findings. IMPRESSION: CTA 1. No thoracoabdominal aortic dissection or aneurysm. 2. Mild aortic atherosclerosis. No penetrating ulcer or other findings to suggest acute aortic  syndrome. 3. Aortic branch vessels are widely patent. NON CTA 1. Acute mild compression fracture of L2 as described on the earlier exams. 2. No other acute abnormality. 3. Lungs demonstrate subpleural reticulation that has progressed compared to the prior CT of the chest on 08/24/2021 consistent with progression of interstitial lung disease. 4. Advanced centrilobular emphysema. Electronically Signed   By: Lajean Manes M.D.   On: 06/20/2022 13:28   CT Renal Stone Study  Result Date: 06/20/2022 CLINICAL DATA:  Abdominal pain. Low back pain for 6 days. Pain increased with movement. EXAM: CT ABDOMEN AND PELVIS WITHOUT CONTRAST TECHNIQUE: Multidetector CT imaging of the abdomen and pelvis was performed following the standard protocol without IV contrast. Multidetector CT imaging of the lumbar spine was performed without intravenous contrast administration. Multiplanar CT image reconstructions were also generated. RADIATION DOSE REDUCTION: This exam was performed according to the departmental dose-optimization program which includes automated exposure control, adjustment of the mA and/or kV according to patient size and/or use of iterative reconstruction technique. COMPARISON:  Lumbar MRI, 10/10/2021 FINDINGS: Lower chest: Interstitial fibrotic changes.  No acute findings. Hepatobiliary: No focal liver abnormality is seen. No gallstones, gallbladder wall thickening, or biliary dilatation. Pancreas: Unremarkable. No pancreatic ductal dilatation or surrounding inflammatory changes. Spleen: Normal in size without focal abnormality. Adrenals/Urinary Tract: Adrenal glands are unremarkable. Kidneys are normal, without renal calculi, focal lesion, or hydronephrosis. Bladder is unremarkable. Stomach/Bowel: Normal stomach. Small bowel and colon are normal in caliber. No wall thickening. No inflammation. Scattered left colon diverticula. No diverticulitis. No evidence of appendicitis. Vascular/Lymphatic: Aortic  atherosclerosis. No aneurysm. No enlarged lymph nodes. Reproductive: Status post hysterectomy. No adnexal masses. Other: No abdominal wall hernia or abnormality. No abdominopelvic ascites. Musculoskeletal: Recent/acute appearing mild compression fracture of L2. There is also mild compression fracture L3 of unclear chronicity. No other fractures. No bone lesions. CT LUMBAR SPINE FINDINGS Segmentation: 5 lumbar type vertebrae. Alignment: Mild levoscoliosis, apex at L2. Minor anterolisthesis, grade 1, L4 on L5. No other spondylolisthesis. Vertebrae: Mild compression fracture of the L2 vertebral body with depression of the upper endplate, which appears acute, and is new compared to the prior MRI. Mild compression fracture of L3, stable from the prior MRI, chronic. No other fractures. No bone lesions. Paraspinal and other soft tissues: Negative. Disc levels: Mild loss of disc height at L2-L3 and L4-L5. Moderate to marked right and mild left facet degenerative changes at L4-L5. No disc herniation. No significant central stenosis or lateral recess narrowing. IMPRESSION: CT ABDOMEN AND PELVIS 1. No acute, non skeletal findings. 2. Aortic atherosclerosis. CT LUMBAR SPINE 1. Acute mild compression fracture of L2. Fracture is not protrude into the spinal canal or neural foramina. No other acute fractures or acute findings. 2. Chronic mild compression fracture of L3. Electronically Signed   By: Lajean Manes M.D.   On: 06/20/2022 12:00   CT L-SPINE NO CHARGE  Result Date: 06/20/2022 CLINICAL DATA:  Abdominal pain. Low back pain for 6 days. Pain increased with movement. EXAM: CT ABDOMEN AND PELVIS WITHOUT CONTRAST TECHNIQUE: Multidetector CT imaging of the abdomen and pelvis was performed following the standard protocol without IV contrast. Multidetector CT imaging of the lumbar spine was performed  without intravenous contrast administration. Multiplanar CT image reconstructions were also generated. RADIATION DOSE  REDUCTION: This exam was performed according to the departmental dose-optimization program which includes automated exposure control, adjustment of the mA and/or kV according to patient size and/or use of iterative reconstruction technique. COMPARISON:  Lumbar MRI, 10/10/2021 FINDINGS: Lower chest: Interstitial fibrotic changes.  No acute findings. Hepatobiliary: No focal liver abnormality is seen. No gallstones, gallbladder wall thickening, or biliary dilatation. Pancreas: Unremarkable. No pancreatic ductal dilatation or surrounding inflammatory changes. Spleen: Normal in size without focal abnormality. Adrenals/Urinary Tract: Adrenal glands are unremarkable. Kidneys are normal, without renal calculi, focal lesion, or hydronephrosis. Bladder is unremarkable. Stomach/Bowel: Normal stomach. Small bowel and colon are normal in caliber. No wall thickening. No inflammation. Scattered left colon diverticula. No diverticulitis. No evidence of appendicitis. Vascular/Lymphatic: Aortic atherosclerosis. No aneurysm. No enlarged lymph nodes. Reproductive: Status post hysterectomy. No adnexal masses. Other: No abdominal wall hernia or abnormality. No abdominopelvic ascites. Musculoskeletal: Recent/acute appearing mild compression fracture of L2. There is also mild compression fracture L3 of unclear chronicity. No other fractures. No bone lesions. CT LUMBAR SPINE FINDINGS Segmentation: 5 lumbar type vertebrae. Alignment: Mild levoscoliosis, apex at L2. Minor anterolisthesis, grade 1, L4 on L5. No other spondylolisthesis. Vertebrae: Mild compression fracture of the L2 vertebral body with depression of the upper endplate, which appears acute, and is new compared to the prior MRI. Mild compression fracture of L3, stable from the prior MRI, chronic. No other fractures. No bone lesions. Paraspinal and other soft tissues: Negative. Disc levels: Mild loss of disc height at L2-L3 and L4-L5. Moderate to marked right and mild left facet  degenerative changes at L4-L5. No disc herniation. No significant central stenosis or lateral recess narrowing. IMPRESSION: CT ABDOMEN AND PELVIS 1. No acute, non skeletal findings. 2. Aortic atherosclerosis. CT LUMBAR SPINE 1. Acute mild compression fracture of L2. Fracture is not protrude into the spinal canal or neural foramina. No other acute fractures or acute findings. 2. Chronic mild compression fracture of L3. Electronically Signed   By: Lajean Manes M.D.   On: 06/20/2022 12:00    Microbiology: No results found for this or any previous visit (from the past 240 hour(s)).   Labs: Basic Metabolic Panel: Recent Labs  Lab 06/25/22 0459 06/26/22 0402 07/01/22 0314  NA 138 138 138  K 4.1 4.4 3.9  CL 106 106 106  CO2 '27 27 25  '$ GLUCOSE 96 96 97  BUN '8 10 9  '$ CREATININE 0.79 0.80 0.57  CALCIUM 8.9 9.3 8.9  MG  --  1.9  --    Liver Function Tests: No results for input(s): "AST", "ALT", "ALKPHOS", "BILITOT", "PROT", "ALBUMIN" in the last 168 hours. No results for input(s): "LIPASE", "AMYLASE" in the last 168 hours. No results for input(s): "AMMONIA" in the last 168 hours. CBC: Recent Labs  Lab 06/26/22 0402  WBC 9.0  NEUTROABS 5.8  HGB 12.3  HCT 40.4  MCV 102.5*  PLT 266   Cardiac Enzymes: No results for input(s): "CKTOTAL", "CKMB", "CKMBINDEX", "TROPONINI" in the last 168 hours. BNP: BNP (last 3 results) No results for input(s): "BNP" in the last 8760 hours.  ProBNP (last 3 results) No results for input(s): "PROBNP" in the last 8760 hours.  CBG: No results for input(s): "GLUCAP" in the last 168 hours.     Signed:  Nita Sells MD   [facility] 07/01/2022, 9:34 AM

## 2022-07-05 ENCOUNTER — Encounter (INDEPENDENT_AMBULATORY_CARE_PROVIDER_SITE_OTHER): Payer: Medicare Other | Admitting: Ophthalmology

## 2022-07-06 ENCOUNTER — Ambulatory Visit: Payer: Medicare Other | Admitting: Hematology and Oncology

## 2022-07-06 ENCOUNTER — Other Ambulatory Visit: Payer: Medicare Other

## 2022-07-08 ENCOUNTER — Ambulatory Visit (INDEPENDENT_AMBULATORY_CARE_PROVIDER_SITE_OTHER): Payer: Medicare Other | Admitting: Internal Medicine

## 2022-07-08 ENCOUNTER — Encounter: Payer: Self-pay | Admitting: Internal Medicine

## 2022-07-08 ENCOUNTER — Ambulatory Visit: Payer: Medicare Other | Admitting: Internal Medicine

## 2022-07-08 VITALS — BP 104/60 | HR 61 | Temp 97.7°F | Ht 63.0 in | Wt 137.0 lb

## 2022-07-08 DIAGNOSIS — M349 Systemic sclerosis, unspecified: Secondary | ICD-10-CM

## 2022-07-08 DIAGNOSIS — J849 Interstitial pulmonary disease, unspecified: Secondary | ICD-10-CM | POA: Diagnosis not present

## 2022-07-08 DIAGNOSIS — Z79899 Other long term (current) drug therapy: Secondary | ICD-10-CM

## 2022-07-08 DIAGNOSIS — J432 Centrilobular emphysema: Secondary | ICD-10-CM

## 2022-07-08 DIAGNOSIS — R768 Other specified abnormal immunological findings in serum: Secondary | ICD-10-CM | POA: Diagnosis not present

## 2022-07-08 LAB — PULMONARY FUNCTION TEST
DL/VA % pred: 73 %
DL/VA: 3 ml/min/mmHg/L
DLCO cor % pred: 64 %
DLCO cor: 11.93 ml/min/mmHg
DLCO unc % pred: 61 %
DLCO unc: 11.51 ml/min/mmHg
FEF 25-75 Pre: 1.25 L/sec
FEF2575-%Pred-Pre: 93 %
FEV1-%Pred-Pre: 97 %
FEV1-Pre: 1.84 L
FEV1FVC-%Pred-Pre: 99 %
FEV6-%Pred-Pre: 105 %
FEV6-Pre: 2.51 L
FEV6FVC-%Pred-Pre: 105 %
FVC-%Pred-Pre: 99 %
FVC-Pre: 2.52 L
Pre FEV1/FVC ratio: 73 %
Pre FEV6/FVC Ratio: 100 %

## 2022-07-08 MED ORDER — BENZONATATE 200 MG PO CAPS: 200.0000 mg | ORAL_CAPSULE | Freq: Three times a day (TID) | ORAL | 1 refills | Status: DC | PRN

## 2022-07-08 NOTE — Progress Notes (Signed)
Spirometry/diffusion capacity performed today.

## 2022-07-08 NOTE — Progress Notes (Signed)
PCP Tara C, PA-[name]   HPI  IOV 02/10/2016  Chief Complaint  Patient presents with   Pulmonary Consult    Pt referred by Dr. Melford Aase for chronic cough x 1 year. Pt states she feels she has a tickle that is causing her dry cough. Pt states she has DOE when climbing stairs. Pt deneis CP/tightness.     82 year old female with hemochromatosis homozygous gene followed by Dr. Beryle Beams (last phlebotomy many years ago and is on serial monitoring) with children and siblings with active disease. She has also atrial fibrillation followed by Dr. Caryl Comes. Reports insidious onset of chronic cough in the last 1 year. It is stable since onset. It fluctuates between mild and severe in severity. It is mostly dry in quality. Mostly present in the daytime but sometimes also wakes her up at night. It is definitely not progressive. It is episodic and present every day. Aggravated by talking sometimes and then the throat feels dry associated with tickle in the throat and also sensation the cough is coming from the upper chest retrosternally and sometimes relieved by drinking water or chewing on a lozenge. Also aggravated by seasonal changes particularly in the spring and the fall which makes her think she has allergies. There is sometimes associated gag. She also reports nonspecific occasional wheezing and associated shortness of breath that is nonspecific but present with exertion and relieved by rest No other clear cut aggravating or relieving factors.   cough associated history  - Medications: She is not on fish oil or ACE inhibitors - Sinus drainage: She does admit to spring allergies. She did have something removed from her hard palate several years ago by ENT does not know details - Acid reflux: She says she is significant acid reflux and is on Prilosec. Without which she'll have significant symptoms - Pulmonary disease: FenO 12 02/10/2016  and normal. She denies any personal history of asthma or  pulmonary fibrosis of COPD or emphysema smoked one pack per day started smoking at age 18 and quit in 1999. Making a 42 pack smoking history. Chest x-ray 08/26/2014 personally visualized is clear - Tobacco :  reports that she quit smoking about 18 years ago. She has a 42.00 pack-year smoking history. She has never used smokeless tobacco.       OV 02/18/2016  Chief Complaint  Patient presents with   Follow-up    Pt here after PFT and HRCT. Pt denies changes in SOB and cough. Pt denies any new complaints at this time.    Follow-up chronic cough. This visit is to follow-up on test results of function test and high resolution CT chest which are described below9 results show isolated reduction in diffusion capacity which can be explained by possible early ILD and also emphysema. There are also new findings of nodule 6 mm bilaterally on lower lobes. She prefers a very cautious approach to this evaluation.     Pulmonary function test 02/12/2016 - FVC 2.8 cm/101%, FEV1 1.9 L/91%. Ratio 60/90%. Total lung capacity 115%. DLCO reduced at 14.5/60%. She is isolated reduction in diffusion capacity    HRCT chest 02/16/16 IMPRESSION: 1. Mild patchy subpleural reticulation in both lungs with a basilar predominance. No significant traction bronchiectasis. No frank honeycombing. Findings could represent an interstitial lung disease such as nonspecific interstitial pneumonia (NSIP), with early usual interstitial pneumonia (UIP) not excluded. A follow-up high-resolution chest CT study in 12 months is recommended to assess temporal pattern stability. 2. Bilateral lower lobe  solid pulmonary nodules, largest 6 mm. Non-contrast chest CT at 3-6 months is recommended. If the nodules are stable at time of repeat CT, then future CT at 18-24 months (from today's scan) is considered optional for low-risk patients, but is recommended for high-risk patients. This recommendation follows the consensus statement:  Guidelines for Management of Incidental Pulmonary Nodules Detected on CT Images:From the Fleischner Society 2017; published online before print (10.1148/radiol.[id number]). 3. Additional findings include mild-to-moderate centrilobular emphysema, aortic atherosclerosis and 2 vessel coronary atherosclerosis.     Electronically Signed   By: Ilona Sorrel M.D.   On: 02/16/2016 14:14    OV 08/31/2016  Chief Complaint  Patient presents with   Follow-up    HRCT was never scheduled. Pt states that the cough is still present >> slightly improved since last OV. Pt states that she feels her allergies has gotten it ramped up again.    Follow-up multifactorial cough associated with autoimmune antibody positive, interstitial lung disease and emphysema previous history of smoking  After last visit in August 2017 she had high resolution CT chest in December 2017 that showed persistence of ILD. I personally visualized the CT chest. He comes for follow-up. She tells me that since August 2017 she has insidious onset of shortness of breath and is progressively worse. It is mild and present only on inclines is not therefore tenderness. In terms of her cough is persistent. It is associated with significant sinus drainage that she thinks is allergy related. She constantly clears her throat. Lab review shows autoimmune antibody positive in August 2017 with scleroderma antibody slightly elevated at 6.4. I referred her to rheumatology but she does not remember this and she did not make this follow-up. She also tells me that she definitely has a long history of [name] phenomena in her fingers but no one has ever formally diagnosed with connective tissu  e disease.  OV 11/28/2016  Chief Complaint  Patient presents with   Follow-up    Pt here after PFT. Pt states her breathing is uchanged since last OV. Pt [name]/o dry cough.- pt states this has slightly improved since last OV. Pt denies CP/tightness and f/[name]/s.      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 Since then she has seen rheumatologis Dr. Keturah Barre. I reviewd the notes and also discussed with the patient wa a good understading of what s going on. She tells me that some of the lupus antibodies have been positive correlating with Raynaud. Patient is on beta blocker for atrial fbrillation and it is the preference by the rheumatologist Dr. D that she switc to calcium channel blocker. She has a follow-up appointment pending with her electrophysiologist Dr. Caryl Comes.  She is scleroderma antibody positive but according to her rheumtolgist she does not have dermatologic  Manifestation of the disese. 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 She pulm  function test today and this is stable. In terms of arrest or symptoms she stable. The cough is actually improved. But she does get dyspneic walking up stairs especially a few flights. She does not wnt her to pulmonary habilitation because se exrcises on the treadmill although she does not monitor her saturations.   reports that she quit smoking about 19 years ago. She has a 42.00 pack-year smoking history. she has never used smokeless tobacco.   OV 06/12/2017  Chief Complaint  Patient presents with   Follow-up    Pt states that she has been doing good since last visit. States that she has a "tickle cough" that is sporadic and has SOB that she states is when she climbs 2 flights of stairs. Denies any CP.    Follow-up combined emphysema was interstitial lung disease [autoimmune undifferentiated connective tissue disease interstitial lung disease]. Autoimmune features from May 2018 rheumatology noted with Dr. Keturah Barre: Raynaud phenomenon positive without gangrene, positive anti-cardiolipin and positive IgM and SCL-70 positive without clinical features of  scleroderma    Last seen June 2018. Since then she's stable. Overall she tells me that only problem is a tickle in her throat and slight cough. This is up and down depending on the pollen exposure she gets. She gets dyspneic for climbing few to several flights of stairs. This is unchanged. She did have full function test today and that shows mild significant worsening though overall gradient is only mild.. However she's not feeling this. She has appointment with Dr. Keturah Barre  pending    OV 07/18/2017  Chief Complaint  Patient presents with   Follow-up    HRCT done 07/03/17.  Pt states she is the same as she was at last visit. SOB with exertion.    Follow-up combined emphysema was interstitial lung disease [autoimmune undifferentiated connective tissue disease interstitial lung disease]. Autoimmune features from May 2018 rheumatology noted with Dr. Keturah Barre: Raynaud phenomenon positive without gangrene, positive anti-cardiolipin and positive IgM and SCL-70 positive without clinical features of scleroderma    SWAY GUTTIERREZ presents for follow-up.  She is here to discuss the results of a high-resolution CT scan of the chest.  The scan was reviewed by Dr. Rosario Jacks thoracic radiology who feels that patient has probable UIP pattern that is definitely progressive compared to August 2017 although there is no comment about progression since June 2018 CT scan.  The pulmonary nodules itself are stable since August 2017 and are likely benign.  The change in the CT scan which is mild progression pulmonary fibrosis corresponds with the pulmonary function test that shows mild progression.  She is now here with her daughter Junie Panning and her husband.  She also states that 1 of her other daughters has rheumatoid arthritis and is on TNF alpha blockade.  All her 3 daughters have hemo-chromatosis    OV 08/29/2017  Chief Complaint  Patient presents with   Follow-up    Pt states she has been doing good since last visit. Pt  recently went to [place] and had some mild problems with SOB due to heat.  Pt was prescribed cellcept and bactrim but has not yet started meds due to questions with other meds she is taking.    82 year old female with ILD secondary to autoimmune disease clinically suspicious of scleroderma but also previous history of hemochromatosis with family history of hemochromatosis.  She is here with her husband.  This visit is only a discussion visit.  Because she has many questions about starting CellCept before she actually does.  We did hepatitis virus panel, QuantiFERON gold, G6PD and all this is normal.  Lab work is normal.  I checked with [facility] pharmacist about interactions and was cleared for her to start CellCept.  The only recommendation was for patient to come off Prilosec because of reduced effect of CellCept.  Patient questions revolved around CellCept: She does not want to come off PPI because of hiatal hernia and has had bad acid  reflux.  So I would not Willis Modena asking for any alternative PPI.  In addition she states she takes multiple multivitamins including Biotene, vitamin D3, vitamin D, vitamin T97 and folic acid.  She also takes Allegra occasionally.  She wants to make sure it is all okay with CellCept.  She has not had a flu shot today and she is asking if she should have it.  She has never had flu shot before.  She is also wondering about anticoagulation in the setting of CellCept and thyroid issues in the setting of Bactrim.  In terms of her genetics: Initially I corresponded with our local geneticist who thought she might be better served at [facility] but the [facility] genetics person wrote to me saying that patient should first be seen by [facility] rheumatology and hematology.  Patient's not so sure she wants to see the subspecialist.  She will speak to Dr. Beryle Beams her local hematologist to see if it is worthwhile seeing the hematology department at [facility].   Lungs/Pleura: Moderate centrilobular emphysema. Mild basilar predominant subpleural reticulation and ground-glass, increased from 02/16/2016. No traction bronchiectasis/ bronchiolectasis, architectural distortion or honeycombing. Side-by-side nodules in the medial left lower lobe measure up to 6 mm (series 3, image 95), unchanged from 02/16/2016 and considered benign. No air trapping. No pleural fluid. Airway is unremarkable.  IMPRESSION: 1. Mild progression in basilar predominant subpleural fibrosis from 02/16/2016, raising suspicion for usual interstitial pneumonitis. 2. Aortic atherosclerosis (ICD10-170.0). Moderate coronary artery calcification. 3.  Emphysema (ICD10-J43.9).     Electronically Signed   By: Lorin Picket M.D.   On: 07/03/2017 09:54    OV 09/19/2017  Chief Complaint  Patient presents with   Follow-up    Pt states she has been doing good. Currently on cellcept and bactrim and has been doing well on it once put back on omeprazole. Pt still has the cough and sometimes when trying to take a deep breath can't fully get all O2 out.   Emonee Winkowski returns for follow-up.  This is for autoimmune interstitial lung disease.  She was started on CellCept recently.  She is here to follow-up for therapeutic drug monitoring.  Because she was on omeprazole and we were initiating CellCept with Bactrim we asked her to hold off on omeprazole because the omeprazole impairs drug levels of CellCept.  However she tried it for a week and her acid reflux was significant despite ranitidine so she went back on omeprazole.  Pharmacist Willis Modena is advising changing the CellCept to equivaekbtdose of Myfortic. So far tolerating cellcept/pred/bactrimm well. Has seen dR G in heme and on observation. Had PFT today on cellcept/pred/bactrim and is better! Continue siwht spriva  OV 10/31/2017  . Chief Complaint  Patient presents with   Follow-up    Pt currently taking  myfortic and states she has been well on that. Pt states she has some mild nausea and has some problems with sleeping at night. SOB is stable,mild coughing. Denies any CP.    Follow-up progressive interstitial lung disease with SCL-70 antibody positive and Raynard Follow-up associated emphysema.  Last visit September 19, 2017.  At that time she had intolerance to the CellCept therefore we switched her to Myfortic.  She is here to report for follow-up about this.  She is tolerating Myfortic just well.  She had some transient insomnia for a few days last week but this resolved.  She only has mild intermittent occasional nausea with the Myfortic but otherwise is  tolerating it really well at the low-dose of 360 mg twice daily.  She continues on prednisone 10 mg daily and Bactrim 3 times a week.  She is due for blood work today.  There are no other new issues.  She continues on Spiriva for her emphysema   OV 02/01/2018  Chief Complaint  Patient presents with   Follow-up    PFT performed today.  Pt stated other than having shingles earlier, things have been doing good for her. Pt states SOB is stable and states she still has an occ cough.    Follow-up progressive interstitial lung disease with SCL-70 antibody positive and Raynud - IPAF Follow-up associated emphysema   KENZLIE DISCH presents for routine follow-up with interstitial lung disease clinic. She has the above problems. In the interim she tells me she had developed shingles on the right neck but this is resolved. This is despite having zoster vaccine in 2012. She was yet to have the bnnew  shingles vaccine.in any event currently she is feeling well. She only has occasional post residual neuropathic pain. In terms of her shortness of breath it is stable and minimal. Walking desaturation test shows stability. Pulmicort function test below show stability. She is tolerating the mycophenolate myfortic well at 360 mg twice daily and prednisone 10 mg  daily and Bactrim for PCP prophylaxis. Overall she feels stable and feels that it is best medicines on touch in terms of dosing change  OV 08/14/2018  Subjective:  Patient ID: [name], female , DOB: 02/16/1941 , age 80 y.o. , MRN: [id number] , ADDRESS: [place] [id number]   08/14/2018 -   Chief Complaint  Patient presents with   Follow-up    PFT performed today.  Pt states she is slowly getting better after her recent pna. States she still has some issues with SOB and an occ cough. Pt also states that she feels like she has a weight on her chest at times.    Follow-up progressive interstitial lung disease with SCL-70 antibody positive and Raynud - IPAF - on myfortic since march 2019 with pred 10 and bactrim  Follow-up associated emphysema - on spiriva    HPIfortic [name] 82 y.o. -returns for 71-monthfollow-up of her interstitial lung disease associated with emphysema.  She presents with her middle daughter EJunie Panningwho is met me before.  She continues on Myfortic and prednisone 10 mg and Bactrim prophylaxis.  She continues on Spiriva.  She had an episode of atrial fibrillation according to review of the chart and my on history with in the middle of January 2020.  At this time she had a chest x-ray that showed asymptomatic upper lobe pneumonia.  I personally visualized the chest x-ray and confirmed the findings.  Antibiotic was not prescribed.  Then I follow this up with a CT scan of the chest [we canceled her elective colonoscopy].  CT chest showed improved pneumonia findings.  I prescribed even though she was feeling well cephalexin for a week.  She says she took it.  She continues to feel well except she has baseline amount of symptoms that is documented below.  Of note the high-resolution CT chest showed an improvement in ILD compared to a year ago.  In fact her pulmonary function tests are also showing stability/improvement as documented below.  She is very  happy about this outcome with immunosuppression .  However, she is a little bit unsure why she is symptomatic in  terms of shortness of breath and cough.  She is very concerned that her fibrosis is getting worse.  She wants to have closer follow-up than every 6 months.   IMPRESSION: HRCT Jan 2020 1. Vaguely bandlike patchy consolidation and ground-glass opacity at the periphery of the right upper and superior segment right lower lobes, new since 07/03/2017 chest CT, improving compared to 07/10/2018 chest radiograph, favoring resolving pneumonia with evolving postinfectious scarring. 2. Previously described basilar predominant subpleural reticulation and ground-glass attenuation is minimal and appears decreased in the interval. No bronchiectasis or honeycombing. Findings may represent an interstitial lung disease such as nonspecific interstitial pneumonia (NSIP) or less likely early usual interstitial pneumonia (UIP). Findings are indeterminate for UIP per consensus guidelines: Diagnosis of Idiopathic Pulmonary Fibrosis: An Official ATS/ERS/JRS/ALAT Clinical Practice Guideline. [place], Iss 5, [phone], Feb 25 2017. 3. Moderate centrilobular emphysema with mild diffuse bronchial wall thickening, suggesting COPD. 4. Two-vessel coronary atherosclerosis. 5. Small hiatal hernia.   Aortic Atherosclerosis (ICD10-I70.0) and Emphysema (ICD10-J43.9). ROS - per HPI    OV 04/22/2019  Subjective:  Patient ID: [name], female , DOB: 1941/05/14 , age 31 y.o. , MRN: [id number] , ADDRESS: [name] [id number]   Follow-up slowly progressive interstitial lung disease with SCL-70 antibody positive and Raynud - IPAF - on myfortic since march 2019 with pred 5 and bactrim 3x/week  Follow-up associated emphysema - on spiriva   04/22/2019 -   Chief Complaint  Patient presents with   Follow-up    Patient reports that her breathing is doing well  at this time.      HPI ASHYRA CANTIN 82 y.o. -presents for follow-up.  Last seen by myself in early part of 2020 before the pandemic.  At that time CT chest suggested possible progression.  She saw a nurse practitioner in the interim after the onset of the pandemic.  She was anxious about the progression of her ILD so she asked for a CT scan in less than 1 year.  A CT scan of the chest was done in September 2020.  This is deemed stable since early part of the year but with possible progression versus stability since January 2019.  In terms of her symptoms she continues to be stable with very minimal shortness of breath and cough.  Her pulmonary function test below also shows stability.  Her walking desaturation test also shows stability.  She is tolerating her Myfortic, prednisone and 3 times a week of Bactrim just fine.  There are no side effects.  Last blood work was in August 2020 and this was reviewed.  We discussed the possibility of switching to antifibrotic nintedanib instead of taking immunosuppressants.  The rational for this would be it is a safer drug with less long-term side effect such as lack of immunosuppression.  However she feels her current regimen is working well for her.  She also feels that she would benefit more from a immunosuppressive drug because of her autoimmune antibodies and therefore she is more inclined to continue with her current immunosuppressive regimen of prednisone 5 mg/day and Myfortic and Bactrim 3 times a week.        OV 07/04/2019  Subjective:  Patient ID: [name], female , DOB: 04-22-1941 , age 26 y.o. , MRN: [id number] , ADDRESS: [place] [id number]   Follow-up mildy progressive - stable interstitial lung disease with SCL-70 antibody positive and Raynud -  IPAF - on myfortic since march 2019 with pred 5 and bactrim 3x/week  Follow-up associated emphysema  Follow-up Raynard  Has associated hemochromatosis  Has  associated atrial fibrillation on Xarelto   07/04/2019 -   Chief Complaint  Patient presents with   Follow-up    Pt states she has been doing well since last visit and denies any complaints.     HPI NEHAL WITTING 82 y.o. -last visit was in October 2020.  At that time she was doing well.  Given her continued immunosuppression with prednisone and Myfortic we discussed the possibility of switching the Myfortic to the nintedanib but understanding that there uncertainties whether this is superior approach and if she could end up with temporary side effects of the nintedanib.  Based on that she decided to continue with prednisone, Myfortic and Bactrim as she was doing because things are working well.  However she later called in and wanted this change to nintedanib.  We did this change to nintedanib but then her co-pay ended up being over 500+ dollars and then she started also getting worried about the side effects with nintedanib particularly GI.  She is also on Xarelto for atrial fibrillation.  Therefore she made this visit ahead of schedule to discuss the possibility of making the switch.  I understand from her that it will be 500+ dollars every month with nintedanib as a co-pay.  In addition she is worried about the superiority of taking nintedanib.  She fully understands that Myfortic over length of time can carry cancer risk and immunosuppression risk but at this point in time she feels well and she is tolerating it well.  She also understands that nintedanib can carry temporary GI side effects.  She also understands that it may not be a superior approach.  She understands it could be an equivalent approach and definitely lower immunosuppression.  Her main systemic immunosuppressive features are lung disease and Raynaud.  She is worried this might flareup.  In terms of her symptoms there is stable as documented below.  Her walking desaturation test is also stable.   Last pulmonary function test  was in August r 2020 Last CT scan of the chest was in September 2020 Last blood work October 2020 Last echocardiogram 2012.  ROS - per HPI  OV 10/22/2019  Subjective:  Patient ID: [name], female , DOB: [birth year] , age 70 y.o. , MRN: [id number] , ADDRESS: [place] [id number]   Follow-up mildy progressive - stable interstitial lung disease with SCL-70 antibody positive and Raynud - IPAF - on myfortic since march 2019 with pred 5 and bactrim 3x/week  Follow-up associated emphysema  Follow-up Raynaud  Has associated hemochromatosis  Has associated atrial fibrillation on Xarelto    10/22/2019 -   Chief Complaint  Patient presents with   Follow-up    PFT performed today. Pt states she has been doing good since last visit. Denies any complaints with breathing.     HPI [name] 82 y.o. -returns for routine follow-up.  Overall doing well.  She feels shortness of breath is stable.  She feels she is tolerating Myfortic and prednisone fine no new interim issues she has had a Covid vaccine.  She continues on high risk immunosuppressive therapy.  There is some mild diarrhea with the Myfortic but it is tolerable.  The shortness of breath scale is stable walking desaturation test is stable.  Her pulmonary function test shows stable FVC with  a possible reduction in diffusion capacity.  I do not know what to make of this at this point in time.  It could be a technical issue because she is feeling stable.  She did have echocardiogram that showed grade 2 diastolic dysfunction.   She continues on her Spiriva for associated emphysema.  ROS - per HPI     OV 03/03/2020   Subjective:  Patient ID: [name], female , DOB: 27-Jun-1941, age 48 y.o. years. 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 -returns for follow-up.  Overall she is doing well.  However in the last 1-2 months she says she is slightly more short of breath than usual.  She is also having slight increase in dry cough.  Based on her subjective symptom questionnaires it seems the cough itself is stable but she is definitely saying it is worse.  She is seeing the dyspnea is overall stable compared to the cough but looking at the symptom questionnaire is maybe it is a little worse.  Is hard to discern.  It has been approximately 1 year since she had a high-resolution CT chest.  She is wondering if she can have this repeated.  She is on immunosuppression.  She is also noticing -that at home her pulse oximetry on her finger varies a lot [she does have [name]]) and sometimes can be as low as 88% - 93%.  She does acknowledge that the circulation in her hands is not as good.  Pulse ox here at rest was normal.  She had pulmonary function test that shows stability.-With the FVC although I am not sure what to make of the DLCO.  She has had a Covid booster vaccine  Last echocardiogram January 2021 with mild diastolic dysfunction    IMPRESSION: 1. Very mild basilar predominant subpleural reticulation and ground-glass, similar to minimally progressive from 07/03/2017. Nonspecific interstitial pneumonitis is favored. Findings are indeterminate for UIP per consensus guidelines: Diagnosis of Idiopathic Pulmonary Fibrosis: An Official ATS/ERS/JRS/ALAT Clinical Practice Guideline. [place], Iss 5, [phone], Feb 25 2017. 2. Aortic atherosclerosis (ICD10-170.0). Coronary artery calcification. 3. Enlarged pulmonic trunk, indicative of pulmonary arterial hypertension. 4.  Emphysema (ICD10-J43.9).     Electronically Signed   By: Lorin Picket M.D.   On: 03/20/2019 14:47  Echocardiogram -January 2021: Shows grade 2 diastolic dysfunction but otherwise unremarkable.   ROS - per HPI  HPI [name] 82 y.o. -last visit was in September 2021.  She continues to do well overall.  Shortness of breath symptom question is stable.  Walking desaturation test is stable.  She is compliant with her medications.  In September 2021 there is concern that her ILD was getting worse because of change in PFT.  So we did a CT scan of the chest radiologist reported this to be stable since 2017.  Most recently she  had blood work in December 2021 chemistry panel is normal and I reviewed it.  CBC panel is also normal with stable mild anemia hemoglobin 12.4 g%.  She is up-to-date with her COVID vaccine including booster.  She had pulmonary function test today that showed some fluctuation.  Her main concern is that she is coughing more  In terms of her cough it is dry and has a barking quality to it.  Or laryngeal quality to it.  It is mostly at day and night but never wakes her up at night.  When she has it at night it is only when she wakes up.  Talking makes it worse.  Laughing makes it worse.  Drinking water makes it better.  She thinks it is progressive.  Symptom score showed the cough is getting worse and deep.  She has no fever or chills.  No desaturations.  We discussed the possibility of opportunistic infection but she rightfully questant if it could be opportunistic infection if everything else is stable.  There is no wheezing.  She is wondering if it could be because of allergies.  She has seasonal allergies.  Review of the records indicate this has not been  investigated.  She is willing to have an investigation for this.  We discussed the possibility of cough neuropathy.  She recollects has had gabapentin before when she had shingles.  She did not have any side effects from it.     CT Chest data sept 2021  TECHNIQUE: Multidetector CT imaging of the chest was performed following the standard protocol without intravenous contrast. High resolution imaging of the lungs, as well as inspiratory and expiratory imaging, was performed.   COMPARISON:  09/09/2019, 03/20/2019, 07/03/2017, 12/06/2016, 06/15/2016   FINDINGS: Cardiovascular: Aortic atherosclerosis. Normal heart size. Three-vessel coronary artery calcifications. No pericardial effusion.   Mediastinum/Nodes: No enlarged mediastinal, hilar, or axillary lymph nodes. Thyroid gland, trachea, and esophagus demonstrate no significant findings.   Lungs/Pleura: Moderate centrilobular emphysema. No significant interval change in subtle irregular peripheral interstitial opacity predominantly seen in the bilateral lung bases, although also noted in nondependent portions of the right middle lobe and lingula. Mild lobular air trapping on expiratory phase imaging. Stable, definitively benign and faintly rim calcified 6 mm nodule of the dependent left lower lobe (series 5, image 86). No pleural effusion or pneumothorax.   Upper Abdomen: No acute abnormality.   Musculoskeletal: No chest wall mass or suspicious bone lesions identified.   IMPRESSION: 1. No significant interval change in subtle irregular peripheral interstitial opacity predominantly seen in the bilateral lung bases, although also noted in nondependent portions of the right middle lobe and lingula. There is no appreciable change in these findings on multiple prior examinations dating back to 2017. Bland post infectious or inflammatory scarring is favored, although minimal fibrotic interstitial lung disease is not excluded and  if characterized by ATS pulmonary fibrosis criteria, findings are consistent with an early "indeterminate for UIP" pattern. Consider ongoing follow-up CT to assess for stability of fibrotic findings. Findings are indeterminate for UIP per consensus guidelines: Diagnosis of Idiopathic Pulmonary Fibrosis: An Official ATS/ERS/JRS/ALAT Clinical Practice Guideline. [place], Iss 5, [phone], Feb 25 2017. 2. Emphysema (ICD10-J43.9). 3. Coronary artery disease.  Aortic Atherosclerosis (ICD10-I70.0).     Electronically Signed   By: Eddie Candle M.D.   On: 03/16/2020 14:12  OV 02/23/2021  Subjective:  Patient ID: [name], female , DOB: December 17, 1940 , age 5 y.o. ,  MRN: [id number] , ADDRESS: [place] [address] PCP Kathyrn Lass, MD Patient Care Team: Kathyrn Lass, MD as PCP - General (Family Medicine) Deboraha Sprang, MD as PCP - Cardiology (Cardiology) Annia Belt, MD as Consulting Physician (Hematology) Deboraha Sprang, MD as Consulting Physician (Cardiology) Richmond Campbell, MD as Consulting Physician (Gastroenterology)  This Provider for this visit: Treatment Team:  Attending Provider: Brand Males, MD    02/23/2021 -   Chief Complaint  Patient presents with   Follow-up    Pt states she has been doing okay since last visit and denies any complaints.    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  Follow-up associated emphysema  Follow-up Raynaud  Has associated hemochromatosis  Has associated atrial fibrillation on Xarelto  Mild associated diastolic dysfunction and echo January 2021  HPI [name] 82 y.o. -returns for her 58-monthfollow-up.  Last visit with me was in February 2022.  Is   She continues on Myfortic prednisone and Bactrim.  We  held off on nintedanib given stability.  She did have spirometry in February 2022.?  There is a slight decline but she feels stable.  Symptom scores are stable.  Walking desaturation test is below and is stable versus a three-point drop that is?  Significant..  Last CT scan September 2021.  But clinically she feels stable.  We held off on nintedanib last visit because of stability.       OV 07/23/2021  Subjective:  Patient ID: PAnastasia [name] female , DOB: [birth year], age 82y.o. , MRN: [id number], [place]: [address] [id number] MKathyrn Lass MD Patient Care Team: MKathyrn Lass MD as PCP - General (Family Medicine) KDeboraha Sprang MD as PCP - Cardiology (Cardiology) GAnnia Belt MD as Consulting Physician (Hematology) KDeboraha Sprang MD as Consulting Physician (Cardiology) MRichmond Campbell MD as Consulting Physician (Gastroenterology)  This Provider for this visit: Treatment Team:  Attending Provider: RBrand Males MD    07/23/2021 -   Chief Complaint  Patient presents with   Follow-up    Pt states she has been doing okay and denies any complaints.    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  Follow-up associated emphysema  Follow-up Raynaud  Has associated hemochromatosis  Has associated atrial fibrillation on Xarelto  Mild associated diastolic dysfunction and echo January 2021  HPI Tara ESTEY89y.o. -returns for 624-monthollow-up.  Last seen in August 2022.  She feels since then dyspnea slightly worse.  This is reflected in the symptom score below.  In December 2022 he did have COVID and she recovered from it.  She does not think the decline is because of the COVID.  She continues on her prednisone Bactrim and Myfortic.  Her last CT scan of the chest was in  September 2021.  Last echocardiogram was in January 2021.  Last pulmonary function test was almost 1 year ago in February 2022.  She is wondering about repeat investigations.  She is concerned about worsening ILD.    Of note she just turned [age] old.  OV 09/09/2021  Subjective:  Patient ID: PaElmon [name]  [name], female , DOB: July 06, 1940 , age 4 y.o. , MRN: [id number] , ADDRESS: [place] [address] PCP Kathyrn Lass, MD Patient Care Team: Kathyrn Lass, MD as PCP - General (Family Medicine) Deboraha Sprang, MD as PCP - Cardiology (Cardiology) Annia Belt, MD as Consulting Physician (Hematology) Deboraha Sprang, MD as Consulting Physician (Cardiology) Richmond Campbell, MD as Consulting Physician (Gastroenterology)  This Provider for this visit: Treatment Team:  Attending Provider: Brand Males, MD    09/09/2021 -   Chief Complaint  Patient presents with   Follow-up    PFT performed today. Pt states she has been doing okay and denies any complaints.    HPI MAKINZE JANI 82 y.o. -returns for follow-up.  At this point in time she is feeling stable.  She had work-up to see if her ILD is worse or she has developed pulmonary hypertension.  CT scan shows stability since 2021.  Her pulmonary function test shows a 3% decline in DLCO.  On an average she has 3% decline per year over the last 4 years.  At this point in time she is feeling well.  No side effects from Myfortic.  She asked about options if she were to get worse.  We discussed about nintedanib.  Also indicated there is an injection [tocilizumab] but at this point in time she is fine with the choices.  She does understand Myfortic is immunosuppressant and there is also cancer risk.  She is decided to continue.  She also has mild emphysema for which she is on Spiriva and it is stable.  I gave her a book called Thursdays with Cornelia Copa written by the patient who has pulmonary fibrosis and scleroderma.  She  is going to read it.  We did discuss briefly clinical trials as a care option and she is interested  -No clearing of the throat.  -She has seen rheumatology recently.  She continues to have [name].  They have a nitroglycerin.  She does not want to try it.  We discussed about using heart warmers.  She is going to buy this   CT Chest data  XR [name] NO REPORT  Result Date: 09/07/2021 Please see Notes tab for imaging impression.      OV 04/21/2022  Subjective:  Patient ID: [name], female , DOB: 09-09-1940 , age 33 y.o. , MRN: [id number] , ADDRESS: [place] [address] PCP Kathyrn Lass, MD Patient Care Team: Kathyrn Lass, MD as PCP - General (Family Medicine) Deboraha Sprang, MD as PCP - Cardiology (Cardiology) Annia Belt, MD as Consulting Physician (Hematology) Deboraha Sprang, MD as Consulting Physician (Cardiology) Richmond Campbell, MD as Consulting Physician (Gastroenterology)  This Provider for this visit: Treatment Team:  Attending Provider: Brand Males, MD  04/21/2022 -   Chief Complaint  Patient presents with   Follow-up    6 Follow-up PT states no changes, DOE (upstairs, fast paced walking)     HPI [name] 82 y.o. -returns for follow-up.  She tells me that since last seeing me since the summer or so she is slightly more short of breath especially when she climbs stairs but overall symptom score is stable.  In September 2023 she went on vacation to [facility] and came back with her girlfriends and she had a great time but she did notice more shortness of breath than usual.  She attributed this to heavier exertion but nevertheless with stairs she is  finding herself more short of breath.  She is compliant with Spiriva for associated emphysema Myfortic Bactrim and daily prednisone.  2 days ago she saw Hazel Sams rheumatology PA for her scleroderma and has been deemed stable  Pulmonary function test shows  slight reduction in FVC over time but still within normal limits but the DLCO is dramatically low.  There is no anemia correction on the DLCO.  When I did a rough anemia correction it appears that DLCO might be okay.  Of asked for a formal correction on the DLCO for anemia.  Given her anemia which appears to be relatively more pronounced compared to 11/26/21 greater than 1.5 g% drop she has been referred by rheumatology to hematology because of history of hemochromatosis.  Her last echo and CT scan of the chest were earlier this year  We discussed about the 3 different respiratory vaccines.  Strongly recommended COVID mRNA because she is immunocompromised.  She did have outpatient COVID December 2022 but it was a different strain.  Also recommend high-dose flu shot which she will get today and we discussed RSV vaccine.     PFT  OV 05/26/2022  Subjective:  Patient ID: [name], female , DOB: [birth year] , age 54 y.o. , MRN: [id number] , ADDRESS: [place] [address] PCP Kathyrn Lass, MD Patient Care Team: Kathyrn Lass, MD as PCP - General (Family Medicine) Deboraha Sprang, MD as PCP - Cardiology (Cardiology) Deboraha Sprang, MD as Consulting Physician (Cardiology) Richmond Campbell, MD as Consulting Physician (Gastroenterology) Orson Slick, MD as Consulting Physician (Hematology and Oncology)  This Provider for this visit: Treatment Team:  Attending Provider: Brand Males, MD    05/26/2022 -   Chief Complaint  Patient presents with   Acute Visit    Pt started having complaints of dry cough, wheezing, and chest discomfort which began 1 week ago. Has done multiple covid tests which all have come back negative. States she is now coughing up phlegm and also has complaints of a headache and has no energy.   HPI [name] 82 y.o. -she is making an acute visit.  She presents with her daughter Junie Panning who I have met before.  She was on a  [place] cruise out of [place] between May 14, 2022 and Saturday after Thanksgiving May 20, 2022.  On Friday after Thanksgiving she suddenly took ill.  Symptoms got worse on Saturday when she returned.  She describes laryngitis ear pain cough headaches sore throat runny nose brain fog increased fatigue and sleeping.  COVID test x 2 was negative.  She called our office and we gave her antibiotic and prednisone because the sputum is now yellow.  She is taking this but she wanted an acute visit today.  Some symptoms are better.  She is saying that her headache is better sore throat is better congestion is better and the tiredness is better but according to the daughter she still sleeping a lot.  Her cough nature has changed and she is got some yellow sputum now.  So some symptoms are not better.  Walking desaturation test is stable but she only walked 2 laps and she stopped because of fatigue.  The cough is particularly worse at night.  She does not want any cough medication  OV 07/08/2022  Subjective:  Patient ID: [name], female , DOB: [birth year] , age 35 y.o. , MRN: [id number] , AD[address]ESS: [place] [address] PCP Kathyrn Lass, MD Patient Care Team: Kathyrn Lass, MD as PCP - General (Family Medicine) Deboraha Sprang, MD as PCP - Cardiology (Cardiology) Deboraha Sprang, MD as Consulting Physician (Cardiology) Richmond Campbell, MD as Consulting Physician (Gastroenterology) Orson Slick, MD as Consulting Physician (Hematology and Oncology)  This Provider for this visit: Treatment Team:  Attending Provider: Brand Males, MD    Follow-up mildy sllowy progressive - stable interstitial lung disease with SCL-70 antibody positive, atnticardiolipnd abd + and Raynud - IPAF - on myfortic since march 2019 with pred 5 and bactrim 3x/week  -Last CT feb 2023 without progerssion.  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  Has associated hemochromatosis  Has associated atrial fibrillation on Xarelto  Mild associated diastolic dysfunction in pst but not on echo  Feb 2023  LBP with L2 compression frcture status post kyphoplasty 1//24.- dec 2023 after viral inection during cruise nov 2023   07/08/2022 -   Chief Complaint  Patient presents with   Follow-up    Spiro/dlco done today. She states had a hospitalization 06/20/22 with compression fx. Her cough is about the same- occ prod and has to clear throat but does not cough out the sputum. Breathing is at baseline.      HPI [name] 82 y.o. -returns for follow-up.  I saw her just after Thanksgiving 2023 when she had come back from a cruise and was having respiratory symptoms of acute bronchitis.  She at that time was through her doxycycline and prednisone taper she had significant cough.  It appears that after that visit her cough continued unabated.  She was coughing a lot at night.  Then she ended up in December 2023 according to history and review of the external records in the hospital with L2 spinal compression fracture.  She believes the cough caused it.  I did explain to her that I have clinically not seen lumbar spinal compression fracture from cough [I subsequently discussed with Dr. Christinia Gully about cough expert and he has seen a few patients with lumbar spine compression fracture with severe cough].  In the hospital hemoglobin was 12.3 g%.  And a creatinine was 0.8 mg percent.  She is now back home she is improving.  She is status post kyphoplasty 06/30/2022.  She still has a cough she says she is 80% better compared to when she saw me in November 2023.  She still has some nighttime cough.  She is willing to try some Tessalon Perles all she is tried so far is Delsym cough syrup.  She continues on her chronic immunosuppressive medications.  At the last visit I  ordered an echocardiogram: This is currently pending and is going to be done soon  At the last visit I ordered high-resolution CT chest but in the hospital she did have a CT angiogram: Mild ILD is reported although this is a contrasted CT.  This was done on 06/20/2022.  I personally visualized and independently agree with the findings  She did a pulmonary function test today: This is stable.     SYMPTOM SCALE - ILD 08/14/2018  04/22/2019  07/04/2019  10/22/2019  03/03/2020 139# 08/18/2020  02/23/2021  07/23/2021 Covid dec 2022 04/21/2022  07/08/2022 Post rsv  O2 use *RA           Shortness of Breath 0 -> 5 scale with 5 being worst (  score 6 If unable to do)           At rest 1 0 0 0.5 1 0 0.5 1 0 1  Simple tasks - showers, clothes change, eating, shaving 1.5 0 .5 0.'5 1 1 '$ 0.5 1.5 0 1  Household (dishes, doing bed, laundry) 1.5 1 0.5 0.'5 1 1 '$ 0.5 1.'5 1 2  '$ Shopping 1.5 0 0/5 0.'5 1 1 1 1 '$ 0 1.5  Walking keeping up with others of same age 27 0 0.5 0.5 1.5 1.'5 1 1 '$ 0 1.5  Walking up Stairs 2.5 1 0.5 0/5 1.5 1.5 1.'5 2 1 3  '$ Total (40 - 48) Dyspnea Score 10- 2 2.'5 3 7 6 5 8 2 10  '$ How bad is your cough? 1.5 1.5 0.5 1 1.5 3 1.'5 2 1 '$ 3.5  How bad is your fatigue 1.'5 1 1 2 '$ 1.5 2 1.'5 2 2 '$ 2.5  nausea    0 0 0.5 0 0 0 0  vomit    0 0 0 0 0 0 0  dairrhea    2.5 0 0.5 1.'5 3 1 1  '$ anxiety    0 0 0 0 0 0 0  deoresoj    0 0 0 0 0 0 0    Simple office walk 185 feet x  3 laps goal with forehead probe 07/04/2019  10/22/2019  03/03/2020 Finger probe 02/23/2021  04/21/2022  05/26/2022 Acute visit  O2 used ra ra ra  ra ra ra  Number laps completed '3 3 3   '$ Stopped at 2   Comments about pace avg  avg  Mod seteady pace avg  Resting Pulse Ox/HR 98% and 74/min 100% and 66 96% and 80 99% and 69 99% and HR 65 100% and HR 71  Final Pulse Ox/HR 98% and 87/min 97% and 81 95% and 83 96% and 78 96% and HR 82 97% and HR 86  Desaturated </= 88% no no no  no   Desaturated <= 3% points no no no yes Yes 3 pnts   Got Tachycardic  >/= 90/min no no no  no   Symptoms at end of test Mild dyspnea Very mild dyspnea no no none Fatigued ad mild dyspnea  Miscellaneous comments x    stable Stopped at 2 of goal 3    PFT     Latest Ref Rng & Units 07/08/2022    2:57 PM 04/12/2022   10:27 AM 09/09/2021   10:46 AM 08/18/2020    2:49 PM 02/28/2020    4:16 PM 10/22/2019   11:28 AM 02/18/2019    2:10 PM  PFT Results  FVC-Pre L 2.52  P 2.48  2.67  2.78  2.90  2.83  2.82   FVC-Predicted Pre % 99  P 96  103  105  108  106  104   Pre FEV1/FVC % % 73  P 72  68  68  66  69  67   FEV1-Pre L 1.84  P 1.80  1.81  1.88  1.92  1.96  1.89   FEV1-Predicted Pre % 97  P 93  94  96  96  98  93   DLCO uncorrected ml/min/mmHg 11.51  P 9.26  11.33  11.74  11.88  12.46  15.31   DLCO UNC% % 61  P 49  60  62  63  66  81   DLCO corrected ml/min/mmHg 11.93  P 9.26  12.46  11.74  11.88  12.46  DLCO COR %Predicted % 64  P 49  66  62  63  66    DLVA Predicted % 73  P 57  70  65  66  73  79     P Preliminary result    CTA chest [id number]   Lungs/Pleura: Mild subpleural reticulation mostly in the dependent lower lobes. Advanced centrilobular emphysema. No lung mass or nodule. No consolidation to suggest pneumonia and no pulmonary edema. No pleural effusion or pneumothorax.   has a past medical history of Atrial fibrillation ([place]), Chest pain, Depression with anxiety, GERD (gastroesophageal reflux disease), Hemochromatosis, History of syncope, Hypothyroidism, and Osteoporosis.   reports that she quit smoking about 25 years ago. Her smoking use included cigarettes. She has a 42.00 pack-year smoking history. She has never been exposed to tobacco smoke. She has never used smokeless tobacco.  Past Surgical History:  Procedure Laterality Date   IR KYPHO LUMBAR INC FX REDUCE BONE BX UNI/BIL CANNULATION INC/IMAGING  06/30/2022   MOUTH SURGERY     SHOULDER ARTHROSCOPY Left    TOTAL ABDOMINAL HYSTERECTOMY     VARICOSE VEIN SURGERY      No Known  Allergies  Immunization History  Administered Date(s) Administered   Fluad Quad(high Dose 65+) 02/18/2019, 04/21/2022   Influenza Split 03/27/2019   Influenza, High Dose Seasonal PF 08/29/2017, 04/26/2018, 04/29/2020, 05/12/2021   PFIZER(Purple Top)SARS-COV-2 Vaccination 07/22/2019, 08/12/2019, 03/11/2020   Pneumococcal Conjugate-13 06/12/2014   Pneumococcal Polysaccharide-23 06/27/2010, 04/25/2018   Tdap 03/16/2011, 07/05/2019   Zoster Recombinat (Shingrix) 04/08/2019   Zoster, Live 06/27/2010, 04/08/2019    Family History  Problem Relation Age of Onset   Stroke Mother    Heart disease Father 48   Hemochromatosis Brother    Breast cancer Neg Hx      Current Outpatient Medications:    acetaminophen (TYLENOL) 325 MG tablet, Take 650 mg by mouth every 6 (six) hours as needed for mild pain or headache., Disp: , Rfl:    apixaban (ELIQUIS) 5 MG TABS tablet, TAKE 1 TABLET(5 MG) BY MOUTH TWICE DAILY (Patient taking differently: Take 5 mg by mouth 2 (two) times daily.), Disp: 180 tablet, Rfl: 2   atorvastatin (LIPITOR) 10 MG tablet, Take 10 mg by mouth daily., Disp: , Rfl:    benzonatate (TESSALON) 100 MG capsule, Take 100 mg by mouth 3 (three) times daily as needed for cough., Disp: , Rfl:    benzonatate (TESSALON) 200 MG capsule, Take 1 capsule (200 mg total) by mouth 3 (three) times daily as needed for cough., Disp: 30 capsule, Rfl: 1   flecainide (TAMBOCOR) 100 MG tablet, Take 1 tablet (100 mg total) by mouth 2 (two) times daily., Disp: 180 tablet, Rfl: 3   levothyroxine (SYNTHROID, LEVOTHROID) 112 MCG tablet, Take 112 mcg by mouth daily., Disp: , Rfl:    methocarbamol (ROBAXIN) 500 MG tablet, Take 1-2 tablets (500-1,000 mg total) by mouth every 6 (six) hours as needed for muscle spasms., Disp: 60 tablet, Rfl: 0   metoprolol succinate (TOPROL-XL) 25 MG 24 hr tablet, TAKE 1 TABLET BY MOUTH  DAILY (Patient taking differently: Take 25 mg by mouth daily.), Disp: 90 tablet, Rfl: 2    mycophenolate (MYFORTIC) 360 MG TBEC EC tablet, TAKE 1 TABLET BY MOUTH TWICE DAILY, Disp: 180 tablet, Rfl: 3   nitroGLYCERIN (NITROGLYN) 2 % ointment, Apply 1/4 inch to fingertips three times daily as needed. (Patient taking differently: Apply 0.5 inches topically 3 (three) times daily as needed for chest pain.), Disp: 30  g, Rfl: 1   ondansetron (ZOFRAN ODT) 4 MG disintegrating tablet, Take 1-2 tablets three times daily as needed (Patient taking differently: Take 4 mg by mouth every 8 (eight) hours as needed for nausea or vomiting.), Disp: 42 tablet, Rfl: 1   oxyCODONE (OXY IR/ROXICODONE) 5 MG immediate release tablet, Take 1 tablet (5 mg total) by mouth every 3 (three) hours as needed for moderate pain or breakthrough pain., Disp: 30 tablet, Rfl: 0   pantoprazole (PROTONIX) 20 MG tablet, Take 20 mg by mouth 2 (two) times daily., Disp: , Rfl:    polyethylene glycol (MIRALAX / GLYCOLAX) 17 g packet, Take 17 g by mouth daily as needed for moderate constipation., Disp: 14 each, Rfl: 0   predniSONE (DELTASONE) 5 MG tablet, Take 5 mg by mouth daily with breakfast., Disp: , Rfl:    senna-docusate (SENOKOT-S) 8.6-50 MG tablet, Take 1 tablet by mouth at bedtime as needed for mild constipation., Disp: 60 tablet, Rfl: 0   sorbitol 70 % SOLN, Take 15 mLs by mouth daily as needed for moderate constipation., Disp: 3800 mL, Rfl: 0   SPIRIVA RESPIMAT 2.5 MCG/ACT AERS, INHALE 2 PUFFS INTO THE LUNGS DAILY, Disp: 4 g, Rfl: 10   sulfamethoxazole-trimethoprim (BACTRIM DS) 800-160 MG tablet, TAKE 1 TABLET BY MOUTH ON MONDAY, WEDNESDAY, AND FRIDAY (Patient taking differently: Take 1 tablet by mouth See admin instructions. Take one tablet by mouth on Mondays Wednesdays and Fridays per patient), Disp: 90 tablet, Rfl: 3   Vitamin D, Cholecalciferol, 25 MCG (1000 UT) TABS, Take 1 tablet by mouth daily., Disp: , Rfl:       Objective:   Vitals:   07/08/22 1605  BP: 104/60  Pulse: 61  Temp: 97.7 F (36.5 [name])  TempSrc: Oral   SpO2: 98%  Weight: 137 lb (62.1 kg)  Height: '5\' 3"'$  (1.6 m)    Estimated body mass index is 24.27 kg/m as calculated from the following:   Height as of this encounter: '5\' 3"'$  (1.6 m).   Weight as of this encounter: 137 lb (62.1 kg).  '@WEIGHTCHANGE'$ @  Filed Weights   07/08/22 1605  Weight: 137 lb (62.1 kg)     Physical Exam General: No distress.  Looks well.  No distress.  Mild scleroderma on the face.  Today is her birthday Neuro: Alert and Oriented x 3. GCS 15. Speech normal Psych: Pleasant Resp:  Barrel Chest - no.  Wheeze - no, Crackles - yes at base, No overt respiratory distress CVS: Normal heart sounds. 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 PEERL +. No post nasal drip        Assessment:       ICD-10-CM   1. ILD (interstitial lung disease) ([place])  J84.9     2. 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 High risk medication use  Z79.899     4. Scleroderma ([place])  M34.9     5. 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 CT dec 2023  shows mild ILD  Plan -Monitor pulse ox levels at home --Otherwise  cotninue  prednisone to 5 mg per day +  myfortic and bactrim at current dose  - appreciate future interest in clinical trials - await echo  Pulmonary emphysema, unspecified emphysema type ([place])  Plan -  Continue spiriva    Followup -3 months with Dr Chase Caller - 30 min visit   - symptom score and walk test at followup  - Call or return sooner if needed   HIGh Complexity NEW  OFFICE  The table below is from the 2021 E/M guidelines, first released in 2021, with minor revisions added in 2023. Must meet the requirements for 2 out of 3 dimensions to qualify.    Number and complexity of problems addressed Amount and/or complexity of data reviewed Risk of complications and/or morbidity  Severe exacerbation of chronic illness  Acute or chronic illnesses that may pose a threat to life or bodily function, e.g., multiple trauma, acute MI, pulmonary embolus, severe respiratory distress, progressive rheumatoid arthritis, psychiatric illness with potential threat to self or others, peritonitis, acute renal failure, abrupt change in neurological status Must meet the requirements for 2 of 3 of the categories)  Category 1: Tests and documents, historian  Any combination of 3 of the following:  Assessment requiring an independent historian  Review of prior external records  Review of results of each unique test  Ordering of each unique test    Category 2: Interpretation of tests  Independent interpretation of a test perfromed by another physician/NPP  Category 3: Discuss management/tests  Discussion of magagement or tests with an external physician/NPP Drug therapy requiring intensive monitoring for toxicity  Decision for elective major surgery with identified pateint or procedure risk factors  Decision regarding hospitalization or escalation of level of care  Decision for DNR or to de-escalate care   Parenteral controlled  substances             SIGNATURE    Dr. Brand Males, M.D., [name],  Pulmonary and Critical Care Medicine Staff Physician, [place] Director - Interstitial Lung Disease  Program  [facility] [place], [place], [address]  Pager: [phone], If no answer or between  15:00h - 7:00h: call [phone] Telephone: [phone]  5:57 PM 07/08/2022

## 2022-07-08 NOTE — Patient Instructions (Addendum)
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 CT dec 2023 shows mild ILD  Plan -Monitor pulse ox levels at home --Otherwise  cotninue  prednisone to 5 mg per day +  myfortic and bactrim at current dose  - appreciate future interest in clinical trials - await echo  Pulmonary emphysema, unspecified emphysema type (HCC)  Plan -  Continue spiriva    Followup -3 months with Dr Chase Caller - 30 min visit   - symptom score and walk test at followup  - Call or return sooner if needed

## 2022-07-08 NOTE — Patient Instructions (Signed)
Spirometry/diffusion capacity performed today.

## 2022-07-13 ENCOUNTER — Other Ambulatory Visit: Payer: Self-pay | Admitting: Hematology and Oncology

## 2022-07-13 ENCOUNTER — Other Ambulatory Visit: Payer: Self-pay

## 2022-07-13 ENCOUNTER — Ambulatory Visit (HOSPITAL_BASED_OUTPATIENT_CLINIC_OR_DEPARTMENT_OTHER): Payer: Medicare Other

## 2022-07-13 ENCOUNTER — Inpatient Hospital Stay: Payer: Medicare Other | Attending: Hematology and Oncology

## 2022-07-13 DIAGNOSIS — R0600 Dyspnea, unspecified: Secondary | ICD-10-CM | POA: Diagnosis not present

## 2022-07-13 DIAGNOSIS — D500 Iron deficiency anemia secondary to blood loss (chronic): Secondary | ICD-10-CM

## 2022-07-13 DIAGNOSIS — J849 Interstitial pulmonary disease, unspecified: Secondary | ICD-10-CM | POA: Diagnosis not present

## 2022-07-13 DIAGNOSIS — D509 Iron deficiency anemia, unspecified: Secondary | ICD-10-CM | POA: Insufficient documentation

## 2022-07-13 DIAGNOSIS — Z79899 Other long term (current) drug therapy: Secondary | ICD-10-CM | POA: Insufficient documentation

## 2022-07-13 LAB — CMP (CANCER CENTER ONLY)
ALT: 10 U/L (ref 0–44)
AST: 13 U/L — ABNORMAL LOW (ref 15–41)
Albumin: 3.7 g/dL (ref 3.5–5.0)
Alkaline Phosphatase: 75 U/L (ref 38–126)
Anion gap: 6 (ref 5–15)
BUN: 8 mg/dL (ref 8–23)
CO2: 30 mmol/L (ref 22–32)
Calcium: 8.9 mg/dL (ref 8.9–10.3)
Chloride: 106 mmol/L (ref 98–111)
Creatinine: 0.59 mg/dL (ref 0.44–1.00)
GFR, Estimated: >60 mL/min (ref >60)
Glucose, Bld: 83 mg/dL (ref 70–99)
Potassium: 3.1 mmol/L — ABNORMAL LOW (ref 3.5–5.1)
Sodium: 142 mmol/L (ref 135–145)
Total Bilirubin: 0.4 mg/dL (ref 0.3–1.2)
Total Protein: 5.7 g/dL — ABNORMAL LOW (ref 6.5–8.1)

## 2022-07-13 LAB — IRON AND IRON BINDING CAPACITY (CC-WL,HP ONLY)
Iron: 221 ug/dL — ABNORMAL HIGH (ref 28–170)
Saturation Ratios: 95 % — ABNORMAL HIGH (ref 10.4–31.8)
TIBC: 232 ug/dL — ABNORMAL LOW (ref 250–450)
UIBC: 11 ug/dL — ABNORMAL LOW (ref 148–442)

## 2022-07-13 LAB — CBC WITH DIFFERENTIAL (CANCER CENTER ONLY)
Abs Immature Granulocytes: 0.03 10*3/uL (ref 0.00–0.07)
Basophils Absolute: 0.1 10*3/uL (ref 0.0–0.1)
Basophils Relative: 1 %
Eosinophils Absolute: 0.3 10*3/uL (ref 0.0–0.5)
Eosinophils Relative: 4 %
HCT: 34.4 % — ABNORMAL LOW (ref 36.0–46.0)
Hemoglobin: 11.2 g/dL — ABNORMAL LOW (ref 12.0–15.0)
Immature Granulocytes: 0 %
Lymphocytes Relative: 11 %
Lymphs Abs: 0.9 10*3/uL (ref 0.7–4.0)
MCH: 31.9 pg (ref 26.0–34.0)
MCHC: 32.6 g/dL (ref 30.0–36.0)
MCV: 98 fL (ref 80.0–100.0)
Monocytes Absolute: 0.6 10*3/uL (ref 0.1–1.0)
Monocytes Relative: 7 %
Neutro Abs: 6.4 10*3/uL (ref 1.7–7.7)
Neutrophils Relative %: 77 %
Platelet Count: 243 10*3/uL (ref 150–400)
RBC: 3.51 MIL/uL — ABNORMAL LOW (ref 3.87–5.11)
RDW: 19.6 % — ABNORMAL HIGH (ref 11.5–15.5)
WBC Count: 8.3 10*3/uL (ref 4.0–10.5)
nRBC: 0 % (ref 0.0–0.2)

## 2022-07-13 LAB — FERRITIN: Ferritin: 30 ng/mL (ref 11–307)

## 2022-07-13 LAB — RETIC PANEL
Immature Retic Fract: 17.1 % — ABNORMAL HIGH (ref 2.3–15.9)
RBC.: 3.44 MIL/uL — ABNORMAL LOW (ref 3.87–5.11)
Retic Count, Absolute: 61.9 10*3/uL (ref 19.0–186.0)
Retic Ct Pct: 1.8 % (ref 0.4–3.1)
Reticulocyte Hemoglobin: 38.4 pg (ref >27.9)

## 2022-07-13 LAB — ECHOCARDIOGRAM COMPLETE
Area-P 1/2: 4.15 cm2
S' Lateral: 2.8 cm

## 2022-07-14 ENCOUNTER — Telehealth: Payer: Self-pay | Admitting: Internal Medicine

## 2022-07-14 ENCOUNTER — Ambulatory Visit
Admission: RE | Admit: 2022-07-14 | Discharge: 2022-07-14 | Disposition: A | Discharge status: Home / Self Care | Payer: Medicare Other | Source: Ambulatory Visit | Attending: Physician Assistant | Admitting: Physician Assistant

## 2022-07-14 DIAGNOSIS — Z8781 Personal history of (healed) traumatic fracture: Secondary | ICD-10-CM

## 2022-07-14 NOTE — Telephone Encounter (Signed)
ECHO - show grade 2 Diastolic dysfunction - this can make people short of breath with exertion. She has a small PFO (hole in upper part of heart that is normal in many adults) but overall she  needs to see cardiology for these  . She sees Dr Caryl Comes and can reach out to him for adice

## 2022-07-14 NOTE — Telephone Encounter (Signed)
Attempted to call pt but unable to reach. Left message to return call.

## 2022-07-18 ENCOUNTER — Other Ambulatory Visit: Payer: Self-pay

## 2022-07-18 ENCOUNTER — Inpatient Hospital Stay: Payer: Medicare Other | Admitting: Hematology and Oncology

## 2022-07-18 VITALS — BP 132/64 | HR 72 | Temp 98.1°F | Resp 15 | Wt 135.7 lb

## 2022-07-18 DIAGNOSIS — D500 Iron deficiency anemia secondary to blood loss (chronic): Secondary | ICD-10-CM

## 2022-07-18 DIAGNOSIS — D509 Iron deficiency anemia, unspecified: Secondary | ICD-10-CM | POA: Diagnosis not present

## 2022-07-18 NOTE — Progress Notes (Signed)
[place] Telephone:([phone]) [phone]   Fax:(336) [phone]  PROGRESS NOTE  Patient Care Team: Kathyrn Lass, MD as PCP - General (Family Medicine) Deboraha Sprang, MD as PCP - Cardiology (Cardiology) Deboraha Sprang, MD as Consulting Physician (Cardiology) Richmond Campbell, MD as Consulting Physician (Gastroenterology) Orson Slick, MD as Consulting Physician (Hematology and Oncology)  Hematological/Oncological History # Iron Deficiency Anemia  # Hereditary Hemochromatosis C282Y Homozygous 09/11/2017: last visit with Dr. Beryle [name] 05/09/2022: establish care with Dr. Lorenso [name]   Interval History:  [name] 82 y.o. female with medical history significant for iron deficiency anemia in setting of hereditary hemochromatosis who presents for a follow up visit. The patient's last visit was on 05/09/2022 at which time she established care. In the interim since the last visit she has continued on PO iron therapy.  On exam today [name] ports that she had a rough December.  She went on a celebrity cruise and subsequently developed RSV.  She is not sure where she developed it and may have been on the flight afterwards.  She reports subsequently after that she developed another infection which she believes she caught while helping her daughter.  She reports that she has been taking her iron pills faithfully other than her brief hospitalization.  She notes that they do cause some occasional constipation.  She notes that they have not been causing any nausea, vomiting, or diarrhea.  Overall she feels quite well.  She is walking and her energy level is currently an 8 out of 10.  Her appetite has been "so-so".  She is not noticed any overt signs of bleeding though her stools are dark while taking iron pills.  She notes that she is not having any lightheadedness, dizziness, or shortness of breath.  A full 10 point ROS was otherwise negative.  MEDICAL HISTORY:  Past Medical  History:  Diagnosis Date   Atrial fibrillation ([place])    a. Flecainide therapy;  b. event monitor 4/12   Chest pain    a. GXT myoview 4/12: no isch., EF 86%;   b. 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 Beryle [name]   History of syncope    Hypothyroidism    Osteoporosis     SURGICAL HISTORY: Past Surgical History:  Procedure Laterality Date   IR KYPHO LUMBAR INC FX REDUCE BONE BX UNI/BIL CANNULATION INC/IMAGING  06/30/2022   MOUTH SURGERY     SHOULDER ARTHROSCOPY Left    TOTAL ABDOMINAL HYSTERECTOMY     VARICOSE VEIN SURGERY      SOCIAL HISTORY: Social History   Socioeconomic History   Marital status: Married    Spouse name: Not on file   Number of children: Not on file   Years of education: Not on file   Highest education level: Not on file  Occupational History   Occupation: RETIRED    Employer: RETIRED  Tobacco Use   Smoking status: Former    Packs/day: 1.00    Years: 42.00    Total pack years: 42.00    Types: Cigarettes    Quit date: 06/27/1997    Years since quitting: 25.0    Passive exposure: Never   Smokeless tobacco: Never  Vaping Use   Vaping Use: Never used  Substance and Sexual Activity   Alcohol use: Yes    Comment:  occ   Drug use: No   Sexual activity: Not on file  Other Topics Concern   Not on file  Social History Narrative   REGULAR EXERCISE   Social Determinants of Health   Financial Resource Strain: Not on file  Food Insecurity: No Food Insecurity (06/20/2022)   Hunger Vital Sign    Worried About Running Out of Food in the Last Year: Never true    Ran Out of Food in the Last Year: Never true  Transportation Needs: No Transportation Needs (06/20/2022)   PRAPARE - Hydrologist (Medical): No    Lack of Transportation (Non-Medical): No  Physical Activity: Not on file   Stress: Not on file  Social Connections: Not on file  Intimate Partner Violence: Not At Risk (06/20/2022)   Humiliation, Afraid, Rape, and Kick questionnaire    Fear of Current or Ex-Partner: No    Emotionally Abused: No    Physically Abused: No    Sexually Abused: No    FAMILY HISTORY: Family History  Problem Relation Age of Onset   Stroke Mother    Heart disease Father 41   Hemochromatosis Brother    Breast cancer Neg Hx     ALLERGIES:  has No Known Allergies.  MEDICATIONS:  Current Outpatient Medications  Medication Sig Dispense Refill   acetaminophen (TYLENOL) 325 MG tablet Take 650 mg by mouth every 6 (six) hours as needed for mild pain or headache.     apixaban (ELIQUIS) 5 MG TABS tablet TAKE 1 TABLET(5 MG) BY MOUTH TWICE DAILY (Patient taking differently: Take 5 mg by mouth 2 (two) times daily.) 180 tablet 2   atorvastatin (LIPITOR) 10 MG tablet Take 10 mg by mouth daily.     flecainide (TAMBOCOR) 100 MG tablet Take 1 tablet (100 mg total) by mouth 2 (two) times daily. 180 tablet 3   levothyroxine (SYNTHROID, LEVOTHROID) 112 MCG tablet Take 112 mcg by mouth daily.     methocarbamol (ROBAXIN) 500 MG tablet Take 1-2 tablets (500-1,000 mg total) by mouth every 6 (six) hours as needed for muscle spasms. 60 tablet 0   metoprolol succinate (TOPROL-XL) 25 MG 24 hr tablet TAKE 1 TABLET BY MOUTH  DAILY (Patient taking differently: Take 25 mg by mouth daily.) 90 tablet 2   mycophenolate (MYFORTIC) 360 MG TBEC EC tablet TAKE 1 TABLET BY MOUTH TWICE DAILY 180 tablet 3   nitroGLYCERIN (NITROGLYN) 2 % ointment Apply 1/4 inch to fingertips three times daily as needed. 30 g 1   ondansetron (ZOFRAN ODT) 4 MG disintegrating tablet Take 1-2 tablets three times daily as needed (Patient taking differently: Take 4 mg by mouth every 8 (eight) hours as needed for nausea or vomiting.) 42 tablet 1   pantoprazole (PROTONIX) 20 MG tablet Take 20 mg by mouth 2 (two) times daily.     predniSONE  (DELTASONE) 5 MG tablet Take 5 mg by mouth daily with breakfast.     sorbitol 70 % SOLN Take 15 mLs by mouth daily as needed for moderate constipation. 3800 mL 0   SPIRIVA RESPIMAT 2.5 MCG/ACT AERS INHALE 2 PUFFS INTO THE LUNGS DAILY 4 g 10   sulfamethoxazole-trimethoprim (BACTRIM DS) 800-160 MG tablet TAKE 1 TABLET BY MOUTH ON MONDAY, WEDNESDAY, AND FRIDAY (Patient taking differently: Take 1 tablet by mouth See admin instructions. Take one tablet by mouth on Mondays Wednesdays and Fridays per patient) 90 tablet 3   Vitamin D, Cholecalciferol, 25 MCG (1000 UT) TABS Take 1  tablet by mouth daily.     oxyCODONE (OXY IR/ROXICODONE) 5 MG immediate release tablet Take 1 tablet (5 mg total) by mouth every 3 (three) hours as needed for moderate pain or breakthrough pain. (Patient not taking: Reported on 07/18/2022) 30 tablet 0   No current facility-administered medications for this visit.    REVIEW OF SYSTEMS:   Constitutional: ( - ) fevers, ( - )  chills , ( - ) night sweats Eyes: ( - ) blurriness of vision, ( - ) double vision, ( - ) watery eyes Ears, nose, mouth, throat, and face: ( - ) mucositis, ( - ) sore throat Respiratory: ( - ) cough, ( - ) dyspnea, ( - ) wheezes Cardiovascular: ( - ) palpitation, ( - ) chest discomfort, ( - ) lower extremity swelling Gastrointestinal:  ( - ) nausea, ( - ) heartburn, ( - ) change in bowel habits Skin: ( - ) abnormal skin rashes Lymphatics: ( - ) new lymphadenopathy, ( - ) easy bruising Neurological: ( - ) numbness, ( - ) tingling, ( - ) new weaknesses Behavioral/Psych: ( - ) mood change, ( - ) new changes  All other systems were reviewed with the patient and are negative.  PHYSICAL EXAMINATION:  Vitals:   07/18/22 1449  BP: 132/64  Pulse: 72  Resp: 15  Temp: 98.1 F (36.7 C)  SpO2: 95%   Filed Weights   07/18/22 1449  Weight: 135 lb 11.2 oz (61.6 kg)    GENERAL: Well-appearing elderly Caucasian female, alert, no distress and comfortable SKIN:  skin color, texture, turgor are normal, no rashes or significant lesions EYES: conjunctiva are pink and non-injected, sclera clear LUNGS: clear to auscultation and percussion with normal breathing effort HEART: regular rate & rhythm and no murmurs and no lower extremity edema Musculoskeletal: no cyanosis of digits and no clubbing  PSYCH: alert & oriented x 3, fluent speech NEURO: no focal motor/sensory deficits  LABORATORY DATA:  I have reviewed the data as listed    Latest Ref Rng & Units 07/13/2022    8:49 AM 06/26/2022    4:02 AM 06/22/2022    4:59 AM  CBC  WBC 4.0 - 10.5 K/uL 8.3  9.0  5.7   Hemoglobin 12.0 - 15.0 g/dL 11.2  12.3  11.5   Hematocrit 36.0 - 46.0 % 34.4  40.4  37.5   Platelets 150 - 400 K/uL 243  266  266        Latest Ref Rng & Units 07/13/2022    8:49 AM 07/01/2022    3:14 AM 06/26/2022    4:02 AM  CMP  Glucose 70 - 99 mg/dL 83  97  96   BUN 8 - 23 mg/dL '8  9  10   '$ Creatinine 0.44 - 1.00 mg/dL 0.59  0.57  0.80   Sodium 135 - 145 mmol/L 142  138  138   Potassium 3.5 - 5.1 mmol/L 3.1  3.9  4.4   Chloride 98 - 111 mmol/L 106  106  106   CO2 22 - 32 mmol/L '30  25  27   '$ Calcium 8.9 - 10.3 mg/dL 8.9  8.9  9.3   Total Protein 6.5 - 8.1 g/dL 5.7     Total Bilirubin 0.3 - 1.2 mg/dL 0.4     Alkaline Phos 38 - 126 U/L 75     AST 15 - 41 U/L 13     ALT 0 - 44 U/L 10      .  RADIOGRAPHIC STUDIES: ECHOCARDIOGRAM COMPLETE  Result Date: 07/13/2022    ECHOCARDIOGRAM REPORT   Patient Name:   [name] Date of Exam: 07/13/2022 Medical Rec #:  [id number]         Height:       63.0 in Accession #:    [id number]        Weight:       137.0 lb Date of Birth:  May 18, 1941         BSA:          1.646 m Patient Age:    88 years          BP:           104/60 mmHg Patient Gender: F                 HR:           66 bpm. Exam Location:  [address] Procedure: 2D Echo, Color Doppler, Cardiac Doppler, 3D Echo and Strain Analysis Indications:    Dyspnea R06.00  History:         Patient has prior history of Echocardiogram examinations, most                 recent 08/16/2021. Arrythmias:Atrial Fibrillation.  Sonographer:    Mikki Santee RDCS Referring Phys: [place]  1. Left ventricular ejection fraction, by estimation, is 60 to 65%. The left ventricle has normal function. The left ventricle has no regional wall motion abnormalities. Left ventricular diastolic parameters are consistent with Grade II diastolic dysfunction (pseudonormalization). The average left ventricular global longitudinal strain is -21.3 %. The global longitudinal strain is normal.  2. Right ventricular systolic function is normal. The right ventricular size is normal. There is normal pulmonary artery systolic pressure.  3. Left atrial size was moderately dilated.  4. Right atrial size was mild to moderately dilated.  5. The mitral valve is grossly normal. Trivial mitral valve regurgitation. No evidence of mitral stenosis.  6. The aortic valve is tricuspid. There is mild calcification of the aortic valve. Aortic valve regurgitation is trivial. No aortic stenosis is present.  7. Aortic dilatation noted. There is borderline dilatation of the ascending aorta, measuring 39 mm.  8. The inferior vena cava is normal in size with greater than 50% respiratory variability, suggesting right atrial pressure of 3 mmHg.  9. Evidence of atrial level shunting detected by color flow Doppler. There is a small patent foramen ovale with predominantly left to right shunting across the atrial septum. Comparison(s): No significant change from prior study. FINDINGS  Left Ventricle: Left ventricular ejection fraction, by estimation, is 60 to 65%. The left ventricle has normal function. The left ventricle has no regional wall motion abnormalities. The average left ventricular global longitudinal strain is -21.3 %. The global longitudinal strain is normal. The left ventricular internal cavity size was normal in size.  There is no left ventricular hypertrophy. Left ventricular diastolic parameters are consistent with Grade II diastolic dysfunction (pseudonormalization). Right Ventricle: The right ventricular size is normal. No increase in right ventricular wall thickness. Right ventricular systolic function is normal. There is normal pulmonary artery systolic pressure. The tricuspid regurgitant velocity is 2.63 m/s, and  with an assumed right atrial pressure of 3 mmHg, the estimated right ventricular systolic pressure is 17.7 mmHg. Left Atrium: Left atrial size was moderately dilated. Right Atrium: Right atrial size was mild to moderately dilated. Pericardium: There is no evidence of pericardial effusion. Presence of epicardial  fat layer. Mitral Valve: The mitral valve is grossly normal. Mild mitral annular calcification. Trivial mitral valve regurgitation. No evidence of mitral valve stenosis. Tricuspid Valve: The tricuspid valve is grossly normal. Tricuspid valve regurgitation is trivial. No evidence of tricuspid stenosis. Aortic Valve: The aortic valve is tricuspid. There is mild calcification of the aortic valve. Aortic valve regurgitation is trivial. No aortic stenosis is present. Pulmonic Valve: The pulmonic valve was not well visualized. Pulmonic valve regurgitation is trivial. No evidence of pulmonic stenosis. Aorta: Aortic dilatation noted. There is borderline dilatation of the ascending aorta, measuring 39 mm. Venous: The inferior vena cava is normal in size with greater than 50% respiratory variability, suggesting right atrial pressure of 3 mmHg. IAS/Shunts: Evidence of atrial level shunting detected by color flow Doppler. A small patent foramen ovale is detected with predominantly left to right shunting across the atrial septum.  LEFT VENTRICLE PLAX 2D LVIDd:         4.50 cm   Diastology LVIDs:         2.80 cm   LV e' medial:    5.08 cm/s LV PW:         1.00 cm   LV E/e' medial:  18.4 LV IVS:        0.90 cm   LV e'  lateral:   7.53 cm/s LVOT diam:     2.00 cm   LV E/e' lateral: 12.4 LV SV:         76 LV SV Index:   46        2D Longitudinal Strain LVOT Area:     3.14 cm  2D Strain GLS (A2C):   -21.5 %                          2D Strain GLS (A3C):   -23.5 %                          2D Strain GLS (A4C):   -18.9 %                          2D Strain GLS Avg:     -21.3 %                           3D Volume EF:                          3D EF:        60 %                          LV EDV:       118 ml                          LV ESV:       47 ml                          LV SV:        71 ml RIGHT VENTRICLE RV Basal diam:  3.40 cm RV Mid diam:    2.90 cm RV S prime:     13.20 cm/s TAPSE (M-mode): 2.4 cm LEFT ATRIUM  Index        RIGHT ATRIUM           Index LA diam:        3.70 cm 2.25 cm/m   RA Area:     19.60 cm LA Vol (A2C):   71.6 ml 43.49 ml/m  RA Volume:   52.50 ml  31.89 ml/m LA Vol (A4C):   83.4 ml 50.65 ml/m LA Biplane Vol: 80.4 ml 48.83 ml/m  AORTIC VALVE LVOT Vmax:   110.00 cm/s LVOT Vmean:  67.400 cm/s LVOT VTI:    0.241 m  AORTA Ao Root diam: 3.40 cm Ao Asc diam:  3.90 cm MITRAL VALVE               TRICUSPID VALVE MV Area (PHT): 4.15 cm    TR Peak grad:   27.7 mmHg MV Decel Time: 183 msec    TR Vmax:        263.00 cm/s MV E velocity: 93.40 cm/s MV A velocity: 76.70 cm/s  SHUNTS MV E/A ratio:  1.22        Systemic VTI:  0.24 m                            Systemic Diam: 2.00 cm Buford Dresser MD Electronically signed by Buford Dresser MD Signature Date/Time: 07/13/2022/4:25:15 PM    Final    IR KYPHO LUMBAR INC FX REDUCE BONE BX UNI/BIL CANNULATION INC/IMAGING  Result Date: 06/30/2022 CLINICAL DATA:  82 year old female with acute, symptomatic osteoporotic L2 superior endplate compression fracture. EXAM: FLUOROSCOPIC GUIDED KYPHOPLASTY OF THE L2 VERTEBRAL BODY COMPARISON:  None Available. MEDICATIONS: As antibiotic prophylaxis, Ancef 2 gm IV was ordered pre-procedure and administered  intravenously within 1 hour of incision. ANESTHESIA/SEDATION: Moderate (conscious) sedation was employed during this procedure. A total of Versed 2 mg and Fentanyl 200 mcg was administered intravenously. Moderate Sedation Time: 36 minutes. The patient's level of consciousness and vital signs were monitored continuously by radiology nursing throughout the procedure under my direct supervision. FLUOROSCOPY TIME:  440 mGy COMPLICATIONS: None immediate. PROCEDURE: The procedure, risks (including but not limited to bleeding, infection, organ damage), benefits, and alternatives were explained to the patient. Questions regarding the procedure were encouraged and answered. The patient understands and consents to the procedure. The patient has suffered a fracture of the L2 superior endplate. It is recommended that patients aged 74 years or older be evaluated for possible testing or treatment of osteoporosis. A copy of this procedure report is sent to the patient's referring physician The patient was placed prone on the fluoroscopic table. The skin overlying the upper lumbar region was then prepped and draped in the usual sterile fashion. Maximal barrier sterile technique was utilized including caps, mask, sterile gowns, sterile gloves, sterile drape, hand hygiene and skin antiseptic. Intravenous Fentanyl and Versed were administered as conscious sedation during continuous cardiorespiratory monitoring by the radiology RN. The left pedicle at L2 was then infiltrated with 1% lidocaine followed by the advancement of a Kyphon trocar needle through the left pedicle into the posterior one-third of the vertebral body. Subsequently, the osteo drill was advanced to the anterior third of the vertebral body. The osteo drill was retracted. Through the working cannula, a Kyphon inflatable bone tamp 15 x 3 was advanced and positioned with the distal marker approximately 5 mm from the anterior aspect of the cortex. Appropriate positioning  was confirmed on the AP projection. At this time, the  balloon was expanded using contrast via a Kyphon inflation syringe device via micro tubing. In similar fashion, the right L2 pedicle was infiltrated with 1% lidocaine followed by the advancement of a second Kyphon trocar needle through the right pedicle into the posterior third of the vertebral body. Subsequently, the osteo drill was coaxially advanced to the anterior right third. The osteo drill was exchanged for a Kyphon inflatable bone tamp 15 x 3, advanced to the 5 mm of the anterior aspect of the cortex. The balloon was then expanded using contrast as above. Inflations were continued until there was near apposition with the superior end plate. At this time, methylmethacrylate mixture was reconstituted in the Kyphon bone mixing device system. This was then loaded into the delivery mechanism, attached to Kyphon bone fillers. The balloons were deflated and removed followed by the instillation of methylmethacrylate mixture with excellent filling in the AP and lateral projections. No extravasation was noted in the disk spaces or posteriorly into the spinal canal. No epidural venous contamination was seen. The working cannulae and the bone filler were then retrieved and removed. Hemostasis was achieved with manual compression. The patient tolerated the procedure well without immediate postprocedural complication. IMPRESSION: 1. Technically successful L2 vertebral body augmentation using balloon kyphoplasty. 2. Per CMS PQRS reporting requirements (PQRS Measure 24): Given the patient's age of greater than 26 and the fracture site (hip, distal radius, or spine), the patient should be tested for osteoporosis using DXA, and the appropriate treatment considered based on the DXA results. PLAN: Follow-up in 2-3 weeks interventional radiology clinic. Ruthann Cancer, MD [facility] Radiology Electronically Signed   By: Ruthann Cancer M.D.   On: 06/30/2022 12:54   DG Abd 2 Views  Result Date: 06/26/2022 CLINICAL DATA:  Lower abdominal pain.  Constipation. EXAM: ABDOMEN - 2 VIEW COMPARISON:  None Available. FINDINGS: The bowel gas pattern is normal. There is no evidence of free air. No radio-opaque calculi or other significant radiographic abnormality is seen. IMPRESSION: Negative two view abdomen. Electronically Signed   By: San Morelle M.D.   On: 06/26/2022 12:43   MR LUMBAR SPINE WO CONTRAST  Result Date: 06/22/2022 CLINICAL DATA:  Initial evaluation for compression fracture. EXAM: MRI LUMBAR SPINE WITHOUT CONTRAST TECHNIQUE: Multiplanar, multisequence MR imaging of the lumbar spine was performed. No intravenous contrast was administered. COMPARISON:  Prior CT from 06/20/2022. FINDINGS: Segmentation: Standard. Lowest well-formed disc space labeled the L5-S1 level. Alignment: Trace 3 mm degenerative anterolisthesis of L4 on L5. Associated trace retrolisthesis of L3 on L4. Alignment otherwise normal preservation of the normal lumbar lordosis. Underlying mild levoscoliosis. Vertebrae: Acute compression fracture involving the superior endplate of L2. Associated height loss measures up to 25% no more than trace 2 mm bony retropulsion. No significant stenosis. Additional mild chronic compression deformity of L3 noted. Vertebral body height otherwise maintained. Bone marrow signal intensity heterogeneous but overall within normal limits. No worrisome osseous lesions. No other abnormal marrow edema. Conus medullaris and cauda equina: Conus extends to the L1 level. Conus and cauda equina appear normal. Paraspinal and other soft tissues: Unremarkable. Disc levels: L1-2: Mild disc bulge, eccentric to the left. Trace 2 mm bony retropulsion related to the L2 compression fracture. Mild facet spurring. No spinal stenosis. Foramina remain patent. L2-3: Mild circumferential disc bulge with disc desiccation. Superimposed small right  foraminal disc protrusion closely approximates the exiting right L2 nerve root without frank impingement (series 5, image 5). Mild facet hypertrophy. No spinal stenosis.  Foramina remain patent. L3-4: Disc desiccation with mild disc bulge, asymmetric to the left. Associated central and left extraforaminal annular fissures. Mild facet hypertrophy. No significant spinal stenosis. Foramina remain patent. L4-5: Trace anterolisthesis. Disc desiccation with mild disc bulge. Moderate right with mild left facet hypertrophy. No significant spinal stenosis. Foramina remain patent. L5-S1: Disc desiccation with mild disc bulge. Superimposed left foraminal disc protrusion closely approximates the exiting left L5 nerve root (series 7, image 12). Moderate facet hypertrophy. No spinal stenosis. Mild left L5 foraminal narrowing. Right neural foramen remains patent. IMPRESSION: 1. Acute compression fracture involving the superior endplate of L2 with up to 25% height loss and trace 2 mm bony retropulsion. No significant stenosis. 2. Mild chronic L3 compression fracture without stenosis. 3. Small right foraminal disc protrusion at L2-3, closely approximating and potentially irritating the exiting right L2 nerve root. 4. Left foraminal disc protrusion at L5-S1, potentially affecting the exiting left L5 nerve root. 5. Moderate bilateral facet hypertrophy at L4-5 and L5-S1. Electronically Signed   By: Jeannine Boga M.D.   On: 06/22/2022 00:19   CT Angio Chest/Abd/Pel for Dissection W and/or Wo Contrast  Result Date: 06/20/2022 CLINICAL DATA:  Right lower back pain for 6 days. Pain extending to the abdomen this morning. EXAM: CT ANGIOGRAPHY CHEST, ABDOMEN AND PELVIS TECHNIQUE: Non-contrast CT of the chest was initially obtained. Multidetector CT imaging through the chest, abdomen and pelvis was performed using the standard protocol during bolus administration of intravenous contrast. Multiplanar reconstructed images and MIPs  were obtained and reviewed to evaluate the vascular anatomy. RADIATION DOSE REDUCTION: This exam was performed according to the departmental dose-optimization program which includes automated exposure control, adjustment of the mA and/or kV according to patient size and/or use of iterative reconstruction technique. CONTRAST:  172m OMNIPAQUE IOHEXOL 350 MG/ML SOLN COMPARISON:  Unenhanced CT of the abdomen and pelvis and CT of the lumbar spine performed earlier today. FINDINGS: CTA CHEST FINDINGS Cardiovascular: Heart top-normal in size. No pericardial effusion. Mild three-vessel coronary artery calcifications. Great vessels are normal in caliber. Thoracic aorta mild atherosclerosis. No dissection. Arch branch vessels are widely patent. Mediastinum/Nodes: No neck base, mediastinal or hilar masses or enlarged lymph nodes. Trachea and esophagus are unremarkable. Lungs/Pleura: Mild subpleural reticulation mostly in the dependent lower lobes. Advanced centrilobular emphysema. No lung mass or nodule. No consolidation to suggest pneumonia and no pulmonary edema. No pleural effusion or pneumothorax. Musculoskeletal: No fracture or acute finding. No bone lesion. No chest wall mass. Review of the MIP images confirms the above findings. CTA ABDOMEN AND PELVIS FINDINGS VASCULAR Aorta: Aorta normal in caliber. No dissection. Partly calcified atherosclerotic plaque, relatively mild. No penetrating ulcer. Celiac: Patent without evidence of aneurysm, dissection, vasculitis or significant stenosis. SMA: Patent without evidence of aneurysm, dissection, vasculitis or significant stenosis. Renals: Both renal arteries are patent without evidence of aneurysm, dissection, vasculitis, fibromuscular dysplasia or significant stenosis. IMA: Patent without evidence of aneurysm, dissection, vasculitis or significant stenosis. Inflow: Patent without evidence of aneurysm, dissection, vasculitis or significant stenosis. Veins: No obvious venous  abnormality within the limitations of this arterial phase study. Review of the MIP images confirms the above findings. NON-VASCULAR Hepatobiliary: No focal liver abnormality is seen. No gallstones, gallbladder wall thickening, or biliary dilatation. Pancreas: Unremarkable. No pancreatic ductal dilatation or surrounding inflammatory changes. Spleen: Normal in size without focal abnormality. Adrenals/Urinary Tract: Adrenal glands are unremarkable. Kidneys are normal, without renal calculi, focal lesion, or hydronephrosis. Bladder is unremarkable. Stomach/Bowel: Normal stomach. Small bowel and colon  are normal in caliber. No wall thickening. No inflammation. No evidence of appendicitis. Lymphatic: No enlarged lymph nodes. Reproductive: Status post hysterectomy. No adnexal masses. Other: No abdominal wall hernia or abnormality. No abdominopelvic ascites. Musculoskeletal: Acute mild compression fracture of L2. Chronic mild compression fracture of L3. No other fractures. No bone lesions. Review of the MIP images confirms the above findings. IMPRESSION: CTA 1. No thoracoabdominal aortic dissection or aneurysm. 2. Mild aortic atherosclerosis. No penetrating ulcer or other findings to suggest acute aortic syndrome. 3. Aortic branch vessels are widely patent. NON CTA 1. Acute mild compression fracture of L2 as described on the earlier exams. 2. No other acute abnormality. 3. Lungs demonstrate subpleural reticulation that has progressed compared to the prior CT of the chest on 08/24/2021 consistent with progression of interstitial lung disease. 4. Advanced centrilobular emphysema. Electronically Signed   By: Lajean Manes M.D.   On: 06/20/2022 13:28   CT Renal Stone Study  Result Date: 06/20/2022 CLINICAL DATA:  Abdominal pain. Low back pain for 6 days. Pain increased with movement. EXAM: CT ABDOMEN AND PELVIS WITHOUT CONTRAST TECHNIQUE: Multidetector CT imaging of the abdomen and pelvis was performed following the  standard protocol without IV contrast. Multidetector CT imaging of the lumbar spine was performed without intravenous contrast administration. Multiplanar CT image reconstructions were also generated. RADIATION DOSE REDUCTION: This exam was performed according to the departmental dose-optimization program which includes automated exposure control, adjustment of the mA and/or kV according to patient size and/or use of iterative reconstruction technique. COMPARISON:  Lumbar MRI, 10/10/2021 FINDINGS: Lower chest: Interstitial fibrotic changes.  No acute findings. Hepatobiliary: No focal liver abnormality is seen. No gallstones, gallbladder wall thickening, or biliary dilatation. Pancreas: Unremarkable. No pancreatic ductal dilatation or surrounding inflammatory changes. Spleen: Normal in size without focal abnormality. Adrenals/Urinary Tract: Adrenal glands are unremarkable. Kidneys are normal, without renal calculi, focal lesion, or hydronephrosis. Bladder is unremarkable. Stomach/Bowel: Normal stomach. Small bowel and colon are normal in caliber. No wall thickening. No inflammation. Scattered left colon diverticula. No diverticulitis. No evidence of appendicitis. Vascular/Lymphatic: Aortic atherosclerosis. No aneurysm. No enlarged lymph nodes. Reproductive: Status post hysterectomy. No adnexal masses. Other: No abdominal wall hernia or abnormality. No abdominopelvic ascites. Musculoskeletal: Recent/acute appearing mild compression fracture of L2. There is also mild compression fracture L3 of unclear chronicity. No other fractures. No bone lesions. CT LUMBAR SPINE FINDINGS Segmentation: 5 lumbar type vertebrae. Alignment: Mild levoscoliosis, apex at L2. Minor anterolisthesis, grade 1, L4 on L5. No other spondylolisthesis. Vertebrae: Mild compression fracture of the L2 vertebral body with depression of the upper endplate, which appears acute, and is new compared to the prior MRI. Mild compression fracture of L3, stable  from the prior MRI, chronic. No other fractures. No bone lesions. Paraspinal and other soft tissues: Negative. Disc levels: Mild loss of disc height at L2-L3 and L4-L5. Moderate to marked right and mild left facet degenerative changes at L4-L5. No disc herniation. No significant central stenosis or lateral recess narrowing. IMPRESSION: CT ABDOMEN AND PELVIS 1. No acute, non skeletal findings. 2. Aortic atherosclerosis. CT LUMBAR SPINE 1. Acute mild compression fracture of L2. Fracture is not protrude into the spinal canal or neural foramina. No other acute fractures or acute findings. 2. Chronic mild compression fracture of L3. Electronically Signed   By: Lajean Manes M.D.   On: 06/20/2022 12:00   CT L-SPINE NO CHARGE  Result Date: 06/20/2022 CLINICAL DATA:  Abdominal pain. Low back pain for 6 days. Pain increased with  movement. EXAM: CT ABDOMEN AND PELVIS WITHOUT CONTRAST TECHNIQUE: Multidetector CT imaging of the abdomen and pelvis was performed following the standard protocol without IV contrast. Multidetector CT imaging of the lumbar spine was performed without intravenous contrast administration. Multiplanar CT image reconstructions were also generated. RADIATION DOSE REDUCTION: This exam was performed according to the departmental dose-optimization program which includes automated exposure control, adjustment of the mA and/or kV according to patient size and/or use of iterative reconstruction technique. COMPARISON:  Lumbar MRI, 10/10/2021 FINDINGS: Lower chest: Interstitial fibrotic changes.  No acute findings. Hepatobiliary: No focal liver abnormality is seen. No gallstones, gallbladder wall thickening, or biliary dilatation. Pancreas: Unremarkable. No pancreatic ductal dilatation or surrounding inflammatory changes. Spleen: Normal in size without focal abnormality. Adrenals/Urinary Tract: Adrenal glands are unremarkable. Kidneys are normal, without renal calculi, focal lesion, or hydronephrosis.  Bladder is unremarkable. Stomach/Bowel: Normal stomach. Small bowel and colon are normal in caliber. No wall thickening. No inflammation. Scattered left colon diverticula. No diverticulitis. No evidence of appendicitis. Vascular/Lymphatic: Aortic atherosclerosis. No aneurysm. No enlarged lymph nodes. Reproductive: Status post hysterectomy. No adnexal masses. Other: No abdominal wall hernia or abnormality. No abdominopelvic ascites. Musculoskeletal: Recent/acute appearing mild compression fracture of L2. There is also mild compression fracture L3 of unclear chronicity. No other fractures. No bone lesions. CT LUMBAR SPINE FINDINGS Segmentation: 5 lumbar type vertebrae. Alignment: Mild levoscoliosis, apex at L2. Minor anterolisthesis, grade 1, L4 on L5. No other spondylolisthesis. Vertebrae: Mild compression fracture of the L2 vertebral body with depression of the upper endplate, which appears acute, and is new compared to the prior MRI. Mild compression fracture of L3, stable from the prior MRI, chronic. No other fractures. No bone lesions. Paraspinal and other soft tissues: Negative. Disc levels: Mild loss of disc height at L2-L3 and L4-L5. Moderate to marked right and mild left facet degenerative changes at L4-L5. No disc herniation. No significant central stenosis or lateral recess narrowing. IMPRESSION: CT ABDOMEN AND PELVIS 1. No acute, non skeletal findings. 2. Aortic atherosclerosis. CT LUMBAR SPINE 1. Acute mild compression fracture of L2. Fracture is not protrude into the spinal canal or neural foramina. No other acute fractures or acute findings. 2. Chronic mild compression fracture of L3. Electronically Signed   By: Lajean Manes M.D.   On: 06/20/2022 12:00    ASSESSMENT & PLAN [name] 82 y.o. female with medical history significant for iron deficiency anemia in setting of hereditary hemochromatosis who presents for a follow up visit.  After review of the labs, review of the records, and  discussion with the patient the patients findings are most consistent with iron deficiency anemia complicated by hereditary hemochromatosis.   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  She is not undergone phlebotomy since 2012. -- full nutritional work-up show low iron levels.  -- Due to anemia in the setting of further hemochromatosis we will need to replete her with PO therapy gently. --labs today show white blood cell 8.3, hemoglobin 11.2, MCV 98, and platelets of 243.  Hemoglobin is improved and iron has improved with a ferritin of 30. --RTC in 3 months time with repeat labs in 6 weeks.   No orders of the defined types were placed in this encounter.   All questions were answered. The patient knows to call the clinic with any problems, questions or concerns.  A total of more than 30 minutes were  spent on this encounter with face-to-face time and non-face-to-face time, including preparing to see the patient, ordering tests and/or medications, counseling the patient and coordination of care as outlined above.   Ledell Peoples, MD Department of Hematology/Oncology [place] at [facility] Phone: [phone] Pager: [phone] Email: [email or web address]  07/18/2022 4:07 PM

## 2022-07-20 ENCOUNTER — Encounter (INDEPENDENT_AMBULATORY_CARE_PROVIDER_SITE_OTHER): Payer: Medicare Other | Admitting: Ophthalmology

## 2022-07-20 DIAGNOSIS — H353132 Nonexudative age-related macular degeneration, bilateral, intermediate dry stage: Secondary | ICD-10-CM

## 2022-07-20 DIAGNOSIS — H43813 Vitreous degeneration, bilateral: Secondary | ICD-10-CM

## 2022-07-20 DIAGNOSIS — D3132 Benign neoplasm of left choroid: Secondary | ICD-10-CM

## 2022-07-25 ENCOUNTER — Telehealth: Payer: Self-pay | Admitting: Internal Medicine

## 2022-07-25 MED ORDER — NIRMATRELVIR/RITONAVIR (PAXLOVID)TABLET: 3.0000 | ORAL_TABLET | Freq: Two times a day (BID) | ORAL | 0 refills | Status: DC

## 2022-07-25 MED ORDER — POTASSIUM CHLORIDE CRYS ER 10 MEQ PO TBCR: 10.0000 meq | EXTENDED_RELEASE_TABLET | Freq: Every day | ORAL | 0 refills | Status: DC

## 2022-07-25 MED ORDER — NIRMATRELVIR/RITONAVIR (PAXLOVID)TABLET: 3.0000 | ORAL_TABLET | Freq: Two times a day (BID) | ORAL | 0 refills | Status: AC

## 2022-07-25 MED ORDER — ONDANSETRON HCL 4 MG PO TABS: 4.0000 mg | ORAL_TABLET | Freq: Three times a day (TID) | ORAL | 0 refills | Status: DC | PRN

## 2022-07-25 NOTE — Telephone Encounter (Signed)
Called patient and went over recommendations from MR. She verbalized understanding and verified pharmacy with me over the phone.   Looked over medication list and verified with patient and she is taking flecainide.  Sir please advise. You said to let you know asap.

## 2022-07-25 NOTE — Telephone Encounter (Signed)
Pt tested positive for Covid on home test yesterday 07/24/22. Pt developed headache, increased cough and congestion on Friday 07/22/22. Pt denies fever/ chills/ wheezing. Pt is also requesting new Zofran ODT Rx. Pharmacy is [facility] on [address]. Dr. Chase Caller please advise.

## 2022-07-25 NOTE — Telephone Encounter (Signed)
Have sent results to pt in a mychart message since had difficulty trying to reach pt via phone.

## 2022-07-25 NOTE — Telephone Encounter (Signed)
She is immune suppresed. Do not want covid to flare up   Plan -ok for zofran '4mg'$  q8h prn. If not cutting it can do '8mg'$ . Please send her '4mg'$  tid prn x 7 days with 1 refill  She also needs paxlovid - normal strength. Normal creat 07/13/22  Her potassium was low on 07/13/22 - if she is not on KCL replacement then should take 80mq kcl daily x 7 days  PLEASE CHECK MED LIST  and sensure she is not on the following ->   alfuzosin, amiodarone, clozapine, colchicine, dihydroergotamine, dronedarone, ergotamine, flecainide, lovastatin, lurasidone, methylergonovine, midazolam [oral], pethidine, pimozide, propafenone, propoxyphene, quinidine, ranolazine, sildenafil simvastatin, triazolam).  dapalutamide, carbamazepine, phenobarbital, phenytoin, rifampin, St John's wort)   If she is on any of this LMK ASAP   No Known Allergies     Current Outpatient Medications:    acetaminophen (TYLENOL) 325 MG tablet, Take 650 mg by mouth every 6 (six) hours as needed for mild pain or headache., Disp: , Rfl:    apixaban (ELIQUIS) 5 MG TABS tablet, TAKE 1 TABLET(5 MG) BY MOUTH TWICE DAILY (Patient taking differently: Take 5 mg by mouth 2 (two) times daily.), Disp: 180 tablet, Rfl: 2   atorvastatin (LIPITOR) 10 MG tablet, Take 10 mg by mouth daily., Disp: , Rfl:    flecainide (TAMBOCOR) 100 MG tablet, Take 1 tablet (100 mg total) by mouth 2 (two) times daily., Disp: 180 tablet, Rfl: 3   levothyroxine (SYNTHROID, LEVOTHROID) 112 MCG tablet, Take 112 mcg by mouth daily., Disp: , Rfl:    methocarbamol (ROBAXIN) 500 MG tablet, Take 1-2 tablets (500-1,000 mg total) by mouth every 6 (six) hours as needed for muscle spasms., Disp: 60 tablet, Rfl: 0   metoprolol succinate (TOPROL-XL) 25 MG 24 hr tablet, TAKE 1 TABLET BY MOUTH  DAILY (Patient taking differently: Take 25 mg by mouth daily.), Disp: 90 tablet, Rfl: 2   mycophenolate (MYFORTIC) 360 MG TBEC EC tablet, TAKE 1 TABLET BY MOUTH TWICE DAILY, Disp: 180 tablet, Rfl: 3    nitroGLYCERIN (NITROGLYN) 2 % ointment, Apply 1/4 inch to fingertips three times daily as needed., Disp: 30 g, Rfl: 1   ondansetron (ZOFRAN ODT) 4 MG disintegrating tablet, Take 1-2 tablets three times daily as needed (Patient taking differently: Take 4 mg by mouth every 8 (eight) hours as needed for nausea or vomiting.), Disp: 42 tablet, Rfl: 1   oxyCODONE (OXY IR/ROXICODONE) 5 MG immediate release tablet, Take 1 tablet (5 mg total) by mouth every 3 (three) hours as needed for moderate pain or breakthrough pain. (Patient not taking: Reported on 07/18/2022), Disp: 30 tablet, Rfl: 0   pantoprazole (PROTONIX) 20 MG tablet, Take 20 mg by mouth 2 (two) times daily., Disp: , Rfl:    predniSONE (DELTASONE) 5 MG tablet, Take 5 mg by mouth daily with breakfast., Disp: , Rfl:    sorbitol 70 % SOLN, Take 15 mLs by mouth daily as needed for moderate constipation., Disp: 3800 mL, Rfl: 0   SPIRIVA RESPIMAT 2.5 MCG/ACT AERS, INHALE 2 PUFFS INTO THE LUNGS DAILY, Disp: 4 g, Rfl: 10   sulfamethoxazole-trimethoprim (BACTRIM DS) 800-160 MG tablet, TAKE 1 TABLET BY MOUTH ON MONDAY, WEDNESDAY, AND FRIDAY (Patient taking differently: Take 1 tablet by mouth See admin instructions. Take one tablet by mouth on Mondays Wednesdays and Fridays per patient), Disp: 90 tablet, Rfl: 3   Vitamin D, Cholecalciferol, 25 MCG (1000 UT) TABS, Take 1 tablet by mouth daily., Disp: , Rfl:

## 2022-07-26 ENCOUNTER — Encounter: Payer: Self-pay | Admitting: Internal Medicine

## 2022-07-26 MED ORDER — MOLNUPIRAVIR EUA 200MG CAPSULE: 4.0000 | ORAL_CAPSULE | Freq: Two times a day (BID) | ORAL | 0 refills | Status: AC

## 2022-07-26 NOTE — Telephone Encounter (Signed)
If she cannot stop flecainide then send her MOLNUPIRAVIR course instead

## 2022-07-26 NOTE — Telephone Encounter (Addendum)
Hi Steve: RANDALL RAMPERSAD has covid - office sent her paxlovid but she is on flecainide which while on paxlovid should be held. If it cannot be held, then I will change the script to molnupiravir  Triage: till we hear from Dr Caryl Comes please tell patient to hold paxlovid    SIGNATURE    Dr. Brand Males, M.D., F.C.C.P,  Pulmonary and Critical Care Medicine Staff Physician, [place] Director - Interstitial Lung Disease  Program  Medical Director - [facility] [place] at [place], [place], [address]   Pager: [phone], If no answer  -[place] or Try [phone] Telephone (clinical office): [phone] Telephone (research): [phone]  12:23 PM 07/26/2022

## 2022-07-26 NOTE — Telephone Encounter (Signed)
Please advise paxlovid was sent to pharmacy yesterday and patient took flecainide this morning

## 2022-07-26 NOTE — Telephone Encounter (Signed)
Spoke with Dr. Brantley Persons who would like [name] to call in [facility] and have pt take that and not paxlovid. Spoke with pt and verified pharmacy and sent in [place]. Pt stated understanding. Nothing further needed at this time.

## 2022-07-27 NOTE — Telephone Encounter (Signed)
Mychart message sent by pt: [facility] (supporting Brand Males, MD)16 hours ago (5:02 PM)    The second Covid medication is $300 with insurance. I cannot afford this. Is there another option? Thank you, Mareesa Gathright   MR, please advise.

## 2022-07-28 NOTE — Telephone Encounter (Signed)
Oh My God. I cannot believe the govt and the underwriting of these anti-covid medications. I am sorry. I reached out to  Dr Caryl Comes again - and I just 6:52 AM 07/28/2022 heard from him. She can hold flecainide and complete paxlovid instead. She already has paxlovid. Of note, she was saying she was feeling better

## 2022-08-09 ENCOUNTER — Other Ambulatory Visit (HOSPITAL_COMMUNITY): Payer: Self-pay | Admitting: Family Medicine

## 2022-08-09 DIAGNOSIS — M545 Low back pain, unspecified: Secondary | ICD-10-CM

## 2022-08-10 ENCOUNTER — Ambulatory Visit (HOSPITAL_COMMUNITY)
Admission: RE | Admit: 2022-08-10 | Discharge: 2022-08-10 | Disposition: A | Discharge status: Home / Self Care | Payer: Medicare Other | Source: Ambulatory Visit | Attending: Family Medicine | Admitting: Family Medicine

## 2022-08-10 DIAGNOSIS — M545 Low back pain, unspecified: Secondary | ICD-10-CM | POA: Diagnosis present

## 2022-08-11 ENCOUNTER — Telehealth (HOSPITAL_COMMUNITY): Payer: Self-pay

## 2022-08-11 ENCOUNTER — Other Ambulatory Visit (HOSPITAL_COMMUNITY): Payer: Self-pay | Admitting: Interventional Radiology

## 2022-08-11 ENCOUNTER — Encounter: Payer: Self-pay | Admitting: Family Medicine

## 2022-08-11 DIAGNOSIS — S32010A Wedge compression fracture of first lumbar vertebra, initial encounter for closed fracture: Secondary | ICD-10-CM

## 2022-08-11 DIAGNOSIS — M545 Low back pain, unspecified: Secondary | ICD-10-CM

## 2022-08-11 NOTE — Telephone Encounter (Signed)
Ok per Dr. Estanislado Pandy for L1 KP. AB

## 2022-08-11 NOTE — Telephone Encounter (Signed)
Called to schedule kyphoplasty, no answer, left vm. AB

## 2022-08-12 ENCOUNTER — Telehealth: Payer: Self-pay | Admitting: *Deleted

## 2022-08-12 NOTE — Telephone Encounter (Signed)
Patient with diagnosis of afib on Eliquis for anticoagulation.    Procedure:  L1 KYPHOPLASTY; COMPRESSION FX  Date of procedure: 08/16/22   CHA2DS2-VASc Score = 3   This indicates a 3.2% annual risk of stroke. The patient's score is based upon: CHF History: 0 HTN History: 0 Diabetes History: 0 Stroke History: 0 Vascular Disease History: 0 Age Score: 2 Gender Score: 1      CrCl 72 ml/min  Per office protocol, patient can hold Eliquis for 3 days prior to procedure.    **This guidance is not considered finalized until pre-operative APP has relayed final recommendations.**

## 2022-08-12 NOTE — Telephone Encounter (Signed)
   Pre-operative Risk Assessment    Patient Name: [name]  DOB: 1941/06/02 MRN: [id number]      Request for Surgical Clearance    Procedure:   L1 KYPHOPLASTY; COMPRESSION FX  Date of Surgery:  Clearance 08/16/22 URGENT                                Surgeon:  DR. SANJEEVE DEVESHWAR Surgeon's Group or Practice Name:  [facility] Phone number:  [phone] ATTN: [name] Fax number:  [phone]   Type of Clearance Requested:   - Pharmacy:  Hold Apixaban (Eliquis) x 48 HOURS PRIOR TO PROCEDURE   Type of Anesthesia:   MODERATE SEDATION    Additional requests/questions:    [name]   08/12/2022, 1:12 PM

## 2022-08-14 ENCOUNTER — Other Ambulatory Visit (HOSPITAL_COMMUNITY): Payer: Self-pay | Admitting: Student

## 2022-08-14 DIAGNOSIS — M549 Dorsalgia, unspecified: Secondary | ICD-10-CM

## 2022-08-15 ENCOUNTER — Other Ambulatory Visit: Payer: Self-pay | Admitting: Student

## 2022-08-15 ENCOUNTER — Encounter (HOSPITAL_COMMUNITY): Payer: Medicare Other

## 2022-08-15 NOTE — Telephone Encounter (Signed)
   Primary Cardiologist: Virl Axe, MD  Chart reviewed as part of pre-operative protocol coverage. Given past medical history and time since last visit, based on ACC/AHA guidelines, [name] would be at acceptable risk for the planned procedure without further cardiovascular testing.   Per office protocol, patient can hold Eliquis for 3 days prior to procedure.   I will route this recommendation to the requesting party via Epic fax function and remove from pre-op pool.  Please call with questions.  Emmaline Life, NP-C 08/15/2022, 8:22 AM [address], [address] Office [phone] Fax [phone]

## 2022-08-16 ENCOUNTER — Ambulatory Visit (HOSPITAL_COMMUNITY)
Admission: RE | Admit: 2022-08-16 | Discharge: 2022-08-16 | Disposition: A | Discharge status: Home / Self Care | Payer: Medicare Other | Source: Ambulatory Visit | Attending: Interventional Radiology | Admitting: Interventional Radiology

## 2022-08-16 ENCOUNTER — Encounter (HOSPITAL_COMMUNITY): Payer: Self-pay

## 2022-08-16 DIAGNOSIS — S32010A Wedge compression fracture of first lumbar vertebra, initial encounter for closed fracture: Secondary | ICD-10-CM | POA: Diagnosis present

## 2022-08-16 DIAGNOSIS — X58XXXA Exposure to other specified factors, initial encounter: Secondary | ICD-10-CM | POA: Diagnosis not present

## 2022-08-16 DIAGNOSIS — M549 Dorsalgia, unspecified: Secondary | ICD-10-CM

## 2022-08-16 HISTORY — PX: IR KYPHO LUMBAR INC FX REDUCE BONE BX UNI/BIL CANNULATION INC/IMAGING: IMG5519

## 2022-08-16 LAB — CBC WITH DIFFERENTIAL/PLATELET
Abs Immature Granulocytes: 0.01 10*3/uL (ref 0.00–0.07)
Basophils Absolute: 0.1 10*3/uL (ref 0.0–0.1)
Basophils Relative: 1 %
Eosinophils Absolute: 0.2 10*3/uL (ref 0.0–0.5)
Eosinophils Relative: 3 %
HCT: 40 % (ref 36.0–46.0)
Hemoglobin: 13.3 g/dL (ref 12.0–15.0)
Immature Granulocytes: 0 %
Lymphocytes Relative: 30 %
Lymphs Abs: 1.5 10*3/uL (ref 0.7–4.0)
MCH: 34 pg (ref 26.0–34.0)
MCHC: 33.3 g/dL (ref 30.0–36.0)
MCV: 102.3 fL — ABNORMAL HIGH (ref 80.0–100.0)
Monocytes Absolute: 0.5 10*3/uL (ref 0.1–1.0)
Monocytes Relative: 10 %
Neutro Abs: 2.8 10*3/uL (ref 1.7–7.7)
Neutrophils Relative %: 56 %
Platelets: 286 10*3/uL (ref 150–400)
RBC: 3.91 MIL/uL (ref 3.87–5.11)
RDW: 16.8 % — ABNORMAL HIGH (ref 11.5–15.5)
WBC: 5 10*3/uL (ref 4.0–10.5)
nRBC: 0 % (ref 0.0–0.2)

## 2022-08-16 LAB — PROTIME-INR
INR: 1 (ref 0.8–1.2)
Prothrombin Time: 12.9 seconds (ref 11.4–15.2)

## 2022-08-16 LAB — BASIC METABOLIC PANEL
Anion gap: 10 (ref 5–15)
BUN: 7 mg/dL — ABNORMAL LOW (ref 8–23)
CO2: 27 mmol/L (ref 22–32)
Calcium: 9.6 mg/dL (ref 8.9–10.3)
Chloride: 101 mmol/L (ref 98–111)
Creatinine, Ser: 0.73 mg/dL (ref 0.44–1.00)
GFR, Estimated: >60 mL/min (ref >60)
Glucose, Bld: 83 mg/dL (ref 70–99)
Potassium: 3.5 mmol/L (ref 3.5–5.1)
Sodium: 138 mmol/L (ref 135–145)

## 2022-08-16 MED ORDER — CEFAZOLIN SODIUM-DEXTROSE 2-4 GM/100ML-% IV SOLN
INTRAVENOUS | Status: AC | PRN
  Administered 2022-08-16: 2 g via INTRAVENOUS

## 2022-08-16 MED ORDER — TOBRAMYCIN SULFATE 1.2 G IJ SOLR
INTRAMUSCULAR | Status: AC
  Administered 2022-08-16: 0.1 mg
  Filled 2022-08-16: qty 1.2

## 2022-08-16 MED ORDER — MIDAZOLAM HCL 2 MG/2ML IJ SOLN
INTRAMUSCULAR | Status: AC | PRN
  Administered 2022-08-16: .5 mg via INTRAVENOUS
  Administered 2022-08-16: 1 mg via INTRAVENOUS
  Administered 2022-08-16 (×2): .5 mg via INTRAVENOUS
  Administered 2022-08-16: 1 mg via INTRAVENOUS

## 2022-08-16 MED ORDER — FENTANYL CITRATE (PF) 100 MCG/2ML IJ SOLN
INTRAMUSCULAR | Status: AC | PRN
  Administered 2022-08-16: 25 ug via INTRAVENOUS
  Administered 2022-08-16 (×2): 50 ug via INTRAVENOUS

## 2022-08-16 MED ORDER — CEFAZOLIN SODIUM-DEXTROSE 2-4 GM/100ML-% IV SOLN: 2.0000 g | INTRAVENOUS | Status: DC

## 2022-08-16 MED ORDER — HYDROMORPHONE HCL 1 MG/ML IJ SOLN
INTRAMUSCULAR | Status: AC | PRN
  Administered 2022-08-16 (×2): .5 mg via INTRAVENOUS

## 2022-08-16 MED ORDER — MIDAZOLAM HCL 2 MG/2ML IJ SOLN
INTRAMUSCULAR | Status: AC
  Filled 2022-08-16: qty 4

## 2022-08-16 MED ORDER — IOHEXOL 300 MG/ML  SOLN
50.0000 mL | Freq: Once | INTRAMUSCULAR | Status: AC | PRN
  Administered 2022-08-16: 10 mL

## 2022-08-16 MED ORDER — SODIUM CHLORIDE 0.9 % IV SOLN: INTRAVENOUS | Status: DC

## 2022-08-16 MED ORDER — SODIUM CHLORIDE 0.9 % IV SOLN: INTRAVENOUS | Status: AC

## 2022-08-16 MED ORDER — CEFAZOLIN SODIUM-DEXTROSE 2-4 GM/100ML-% IV SOLN
INTRAVENOUS | Status: AC
  Filled 2022-08-16: qty 100

## 2022-08-16 MED ORDER — HYDROMORPHONE HCL 1 MG/ML IJ SOLN
INTRAMUSCULAR | Status: AC
  Filled 2022-08-16: qty 1

## 2022-08-16 MED ORDER — BUPIVACAINE HCL (PF) 0.5 % IJ SOLN
INTRAMUSCULAR | Status: AC
  Administered 2022-08-16: 15 mL
  Filled 2022-08-16: qty 30

## 2022-08-16 MED ORDER — ACETAMINOPHEN 325 MG PO TABS
650.0000 mg | ORAL_TABLET | Freq: Four times a day (QID) | ORAL | Status: DC | PRN
  Administered 2022-08-16: 650 mg via ORAL

## 2022-08-16 MED ORDER — ACETAMINOPHEN 325 MG PO TABS
ORAL_TABLET | ORAL | Status: AC
  Filled 2022-08-16: qty 2

## 2022-08-16 MED ORDER — FENTANYL CITRATE (PF) 100 MCG/2ML IJ SOLN
INTRAMUSCULAR | Status: AC
  Filled 2022-08-16: qty 4

## 2022-08-16 NOTE — Procedures (Signed)
INR.  Status post L1 balloon kyphoplasty with biopsies.  Bipedicular approach...  No acute complications.  Arlean Hopping MD.

## 2022-08-16 NOTE — Progress Notes (Signed)
Patient and daughter was given discharge instructions. Both verbalized understanding. 

## 2022-08-16 NOTE — H&P (Addendum)
Chief Complaint: Patient was seen in consultation today for painful Lumbar 1 compression fracture- L1Kyphoplasty at the request of Dr Venia Minks   Supervising Physician: Luanne Bras  Patient Status: [facility] - Out-pt  History of Present Illness: [name] is a 82 y.o. female   Known to IR Lumbar 2 kyphoplasty performed 06/30/22 with Dr Serafina Royals Did well for weeks New pain for ~10 days Pain is 8/10 Pain meds help minimally LD Eliquis Sat 08/13/22  CT 08/10/22:  IMPRESSION: 1. Acute compression fracture of the L1 vertebral body with up to approximately 20% loss of vertebral body height centrally and minimal bony retropulsion, new since 06/21/2022. 2. Interval kyphoplasty at L2. The degree of compression deformity is slightly increased compared to the CT from 06/20/2022 when the fracture was acute. 3. Unchanged old L3 compression fracture.   Now scheduled for for Lumbar 2 Kyphoplasty in IR  Past Medical History:  Diagnosis Date   Atrial fibrillation ([place])    a. Flecainide therapy;  b. event monitor 4/12   Chest pain    a. GXT myoview 4/12: no isch., EF 86%;   b. 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 Beryle Beams   History of syncope    Hypothyroidism    Osteoporosis     Past Surgical History:  Procedure Laterality Date   IR KYPHO LUMBAR INC FX REDUCE BONE BX UNI/BIL CANNULATION INC/IMAGING  06/30/2022   MOUTH SURGERY     SHOULDER ARTHROSCOPY Left    TOTAL ABDOMINAL HYSTERECTOMY     VARICOSE VEIN SURGERY      Allergies: Patient has no known allergies.  Medications: Prior to Admission medications   Medication Sig Start Date End Date Taking? Authorizing Provider  acetaminophen (TYLENOL) 325 MG tablet Take 650 mg by mouth every 6 (six) hours as needed for mild pain or headache.    [provider]  apixaban  (ELIQUIS) 5 MG TABS tablet TAKE 1 TABLET(5 MG) BY MOUTH TWICE DAILY Patient taking differently: Take 5 mg by mouth 2 (two) times daily. 12/24/21   Deboraha Sprang, MD  atorvastatin (LIPITOR) 10 MG tablet Take 10 mg by mouth daily. 06/18/19   [provider]  flecainide (TAMBOCOR) 100 MG tablet Take 1 tablet (100 mg total) by mouth 2 (two) times daily. 12/24/21   Deboraha Sprang, MD  levothyroxine (SYNTHROID, LEVOTHROID) 112 MCG tablet Take 112 mcg by mouth daily.    [provider]  methocarbamol (ROBAXIN) 500 MG tablet Take 1-2 tablets (500-1,000 mg total) by mouth every 6 (six) hours as needed for muscle spasms. 07/01/22   Nita Sells, MD  metoprolol succinate (TOPROL-XL) 25 MG 24 hr tablet TAKE 1 TABLET BY MOUTH  DAILY Patient taking differently: Take 25 mg by mouth daily. 05/29/17   Deboraha Sprang, MD  mycophenolate (MYFORTIC) 360 MG TBEC EC tablet TAKE 1 TABLET BY MOUTH TWICE DAILY 06/22/22   Brand Males, MD  nitroGLYCERIN (NITROGLYN) 2 % ointment Apply 1/4 inch to fingertips three times daily as needed. 08/13/21   Ofilia Neas, PA-C  ondansetron (ZOFRAN ODT) 4 MG disintegrating tablet Take 1-2 tablets three times daily as needed Patient taking differently: Take 4 mg by mouth every 8 (eight) hours as needed for nausea or vomiting. 09/11/19   Brand Males, MD  ondansetron (ZOFRAN) 4 MG tablet Take 1  tablet (4 mg total) by mouth every 8 (eight) hours as needed for nausea or vomiting. 07/25/22   Brand Males, MD  oxyCODONE (OXY IR/ROXICODONE) 5 MG immediate release tablet Take 1 tablet (5 mg total) by mouth every 3 (three) hours as needed for moderate pain or breakthrough pain. Patient not taking: Reported on 07/18/2022 07/01/22   Nita Sells, MD  pantoprazole (PROTONIX) 20 MG tablet Take 20 mg by mouth 2 (two) times daily. 11/10/21   [provider]  potassium chloride (KLOR-CON M) 10 MEQ tablet Take 1 tablet (10 mEq total) by mouth daily.  07/25/22   Brand Males, MD  predniSONE (DELTASONE) 5 MG tablet Take 5 mg by mouth daily with breakfast.    [provider]  sorbitol 70 % SOLN Take 15 mLs by mouth daily as needed for moderate constipation. 07/01/22   Nita Sells, MD  SPIRIVA RESPIMAT 2.5 MCG/ACT AERS INHALE 2 PUFFS INTO THE LUNGS DAILY 01/27/22   Brand Males, MD  sulfamethoxazole-trimethoprim (BACTRIM DS) 800-160 MG tablet TAKE 1 TABLET BY MOUTH ON MONDAY, [place], AND FRIDAY Patient taking differently: Take 1 tablet by mouth See admin instructions. Take one tablet by mouth on Mondays Wednesdays and Fridays per patient 12/29/21   Brand Males, MD  Vitamin D, Cholecalciferol, 25 MCG (1000 UT) TABS Take 1 tablet by mouth daily.    [provider]     Family History  Problem Relation Age of Onset   Stroke Mother    Heart disease Father 64   Hemochromatosis Brother    Breast cancer Neg Hx     Social History   Socioeconomic History   Marital status: Married    Spouse name: Not on file   Number of children: Not on file   Years of education: Not on file   Highest education level: Not on file  Occupational History   Occupation: RETIRED    Employer: RETIRED  Tobacco Use   Smoking status: Former    Packs/day: 1.00    Years: 42.00    Total pack years: 42.00    Types: Cigarettes    Quit date: 06/27/1997    Years since quitting: 25.1    Passive exposure: Never   Smokeless tobacco: Never  Vaping Use   Vaping Use: Never used  Substance and Sexual Activity   Alcohol use: Yes    Comment: occ   Drug use: No   Sexual activity: Not on file  Other Topics Concern   Not on file  Social History Narrative   REGULAR EXERCISE   Social Determinants of Health   Financial Resource Strain: Not on file  Food Insecurity: No Food Insecurity (06/20/2022)   Hunger Vital Sign    Worried About Running Out of Food in the Last Year: Never true    Ran Out of Food in the Last Year: Never true   Transportation Needs: No Transportation Needs (06/20/2022)   PRAPARE - Hydrologist (Medical): No    Lack of Transportation (Non-Medical): No  Physical Activity: Not on file  Stress: Not on file  Social Connections: Not on file    Review of Systems: A 12 point ROS discussed and pertinent positives are indicated in the HPI above.  All other systems are negative.  Review of Systems  Constitutional:  Positive for activity change. Negative for fatigue and fever.  Respiratory:  Negative for cough and shortness of breath.   Cardiovascular:  Negative for chest pain.  Gastrointestinal:  Negative  for abdominal pain, nausea and vomiting.  Musculoskeletal:  Positive for back pain.  Neurological:  Positive for weakness.  Psychiatric/Behavioral:  Negative for behavioral problems and confusion.     Vital Signs: BP 133/64   Pulse 63   Temp (!) 97.1 F (36.2 C) (Temporal)   Resp 16   Ht 5' 3"$  (1.6 m)   Wt 131 lb (59.4 kg)   SpO2 96%   BMI 23.21 kg/m    Physical Exam Vitals reviewed.  HENT:     Mouth/Throat:     Mouth: Mucous membranes are moist.  Cardiovascular:     Rate and Rhythm: Normal rate. Rhythm irregular.  Pulmonary:     Effort: Pulmonary effort is normal.     Breath sounds: Normal breath sounds.  Abdominal:     Palpations: Abdomen is soft.  Musculoskeletal:        General: Normal range of motion.     Comments: Low back pain  Skin:    General: Skin is warm.  Neurological:     Mental Status: She is alert and oriented to person, place, and time.  Psychiatric:        Behavior: Behavior normal.     Imaging: CT LUMBAR SPINE WO CONTRAST  Result Date: 08/10/2022 CLINICAL DATA:  Acute right low back pain EXAM: CT LUMBAR SPINE WITHOUT CONTRAST TECHNIQUE: Multidetector CT imaging of the lumbar spine was performed without intravenous contrast administration. Multiplanar CT image reconstructions were also generated. RADIATION DOSE REDUCTION: This  exam was performed according to the departmental dose-optimization program which includes automated exposure control, adjustment of the mA and/or kV according to patient size and/or use of iterative reconstruction technique. COMPARISON:  Lumbar spine MRI 06/21/2022 FINDINGS: Segmentation: Standard; the lowest formed disc space is designated L5-S1. Alignment: There is unchanged trace anterolisthesis of L4 on L5. Previously seen trace retrolisthesis of L3 on L4 is not seen on the current study. There is levocurvature centered at L2. Vertebrae: There is an acute compression fracture of the superior L1 endplate with up to approximately 20% loss of vertebral body height centrally. There is minimal bony retropulsion without significant spinal canal stenosis There has been interval kyphoplasty at L2. The degree of the compression deformity is slightly increased since the CT from 06/20/2022. Minimal bony retropulsion is also slightly increased since that study. The prior L3 compression fracture is unchanged. There is no bony retropulsion at this level. The other vertebral body heights are preserved. Paraspinal and other soft tissues: There is calcified atherosclerotic plaque in the abdominal aorta. The paraspinal soft tissues are unremarkable. Disc levels: There is overall mild multilevel disc space narrowing. There R multilevel mild disc bulges without evidence of high-grade spinal canal or neural foraminal stenosis. There is bulky facet arthropathy on the right at L4-L5 and relatively mild facet arthropathy elsewhere. IMPRESSION: 1. Acute compression fracture of the L1 vertebral body with up to approximately 20% loss of vertebral body height centrally and minimal bony retropulsion, new since 06/21/2022. 2. Interval kyphoplasty at L2. The degree of compression deformity is slightly increased compared to the CT from 06/20/2022 when the fracture was acute. 3. Unchanged old L3 compression fracture. Electronically Signed   By:  Valetta Mole M.D.   On: 08/10/2022 14:29    Labs:  CBC: Recent Labs    06/22/22 0459 06/26/22 0402 07/13/22 0849 08/16/22 0712  WBC 5.7 9.0 8.3 5.0  HGB 11.5* 12.3 11.2* 13.3  HCT 37.5 40.4 34.4* 40.0  PLT 266 266 243 286  COAGS: No results for input(s): "INR", "APTT" in the last 8760 hours.  BMP: Recent Labs    06/25/22 0459 06/26/22 0402 07/01/22 0314 07/13/22 0849  NA 138 138 138 142  K 4.1 4.4 3.9 3.1*  CL 106 106 106 106  CO2 27 27 25 30  $ GLUCOSE 96 96 97 83  BUN 8 10 9 8  $ CALCIUM 8.9 9.3 8.9 8.9  CREATININE 0.79 0.80 0.57 0.59  GFRNONAA >60 >60 >60 >60    LIVER FUNCTION TESTS: Recent Labs    05/09/22 1407 06/20/22 1030 06/21/22 0346 07/13/22 0849  BILITOT 0.4 0.4 0.4 0.4  AST 12* 13* 12* 13*  ALT 10 10 10 10  $ ALKPHOS 56 58 53 75  PROT 7.0 6.4* 5.2* 5.7*  ALBUMIN 4.5 3.7 3.0* 3.7    TUMOR MARKERS: No results for input(s): "AFPTM", "CEA", "CA199", "CHROMGRNA" in the last 8760 hours.  Assessment and Plan:  Acute Lumbar 1 painful compression fracture Scheduled for L1 Kyphoplasty Risks and benefits of Lumbar 1 kyphoplasty were discussed with the patient including, but not limited to education regarding the natural healing process of compression fractures without intervention, bleeding, infection, cement migration which may cause spinal cord damage, paralysis, pulmonary embolism or even death.  This interventional procedure involves the use of X-rays and because of the nature of the planned procedure, it is possible that we will have prolonged use of X-ray fluoroscopy.  Potential radiation risks to you include (but are not limited to) the following: - A slightly elevated risk for cancer  several years later in life. This risk is typically less than 0.5% percent. This risk is low in comparison to the normal incidence of human cancer, which is 33% for women and 50% for men according to the [place]. - Radiation induced injury can  include skin redness, resembling a rash, tissue breakdown / ulcers and hair loss (which can be temporary or permanent).   The likelihood of either of these occurring depends on the difficulty of the procedure and whether you are sensitive to radiation due to previous procedures, disease, or genetic conditions.   IF your procedure requires a prolonged use of radiation, you will be notified and given written instructions for further action.  It is your responsibility to monitor the irradiated area for the 2 weeks following the procedure and to notify your physician if you are concerned that you have suffered a radiation induced injury.    All of the patient's questions were answered, patient is agreeable to proceed. Consent signed and in chart.  Code status - FULL Code - documented in chart  Thank you for this interesting consult.  I greatly enjoyed meeting [name] and look forward to participating in their care.  A copy of this report was sent to the requesting provider on this date.  Electronically Signed: Lavonia Drafts, PA-C 08/16/2022, 7:22 AM   I spent a total of    25 Minutes in face to face in clinical consultation, greater than 50% of which was counseling/coordinating care for L1 KP

## 2022-08-16 NOTE — Discharge Instructions (Signed)
INR.  1.  No stooping, bending or lifting weights above 10 pounds for 2 weeks.  2.  Use walker to ambulate for 2 weeks.  3. No  driving for 2 weeks.  4.  Contact referring MDs office in the next 48 hours regarding the biopsy results.  Arlean Hopping MD.

## 2022-08-17 LAB — SURGICAL PATHOLOGY

## 2022-08-19 ENCOUNTER — Other Ambulatory Visit (HOSPITAL_COMMUNITY): Payer: Self-pay

## 2022-08-20 ENCOUNTER — Other Ambulatory Visit: Payer: Self-pay | Admitting: Internal Medicine

## 2022-08-23 ENCOUNTER — Ambulatory Visit (HOSPITAL_COMMUNITY)
Admission: RE | Admit: 2022-08-23 | Discharge: 2022-08-23 | Disposition: A | Discharge status: Home / Self Care | Payer: Medicare Other | Source: Ambulatory Visit | Attending: Family Medicine | Admitting: Family Medicine

## 2022-08-23 DIAGNOSIS — M810 Age-related osteoporosis without current pathological fracture: Secondary | ICD-10-CM | POA: Diagnosis present

## 2022-08-23 MED ORDER — ZOLEDRONIC ACID 5 MG/100ML IV SOLN
INTRAVENOUS | Status: AC
  Administered 2022-08-23: 5 mg via INTRAVENOUS
  Filled 2022-08-23: qty 100

## 2022-08-23 MED ORDER — ZOLEDRONIC ACID 5 MG/100ML IV SOLN: 5.0000 mg | Freq: Once | INTRAVENOUS | Status: AC

## 2022-08-24 ENCOUNTER — Telehealth: Payer: Self-pay | Admitting: Internal Medicine

## 2022-08-24 NOTE — Telephone Encounter (Signed)
PT wondering why Dr. Alfonso Patten dcln her last request for medications. Pls call to advise @ [phone]

## 2022-08-25 ENCOUNTER — Other Ambulatory Visit: Payer: Self-pay | Admitting: Hematology and Oncology

## 2022-08-25 ENCOUNTER — Telehealth (HOSPITAL_COMMUNITY): Payer: Self-pay

## 2022-08-25 MED ORDER — PREDNISONE 5 MG PO TABS: 5.0000 mg | ORAL_TABLET | Freq: Every day | ORAL | 0 refills | Status: DC

## 2022-08-25 NOTE — Telephone Encounter (Signed)
Called and spoke with patient. She was calling because she received a message from [facility] that MR refused to refill the prednisone. I advised her that I did not see any refusals in her chart but would go ahead and send in the RX. She verbalized understanding.   Nothing further needed at time of call.

## 2022-08-25 NOTE — Telephone Encounter (Signed)
Pt called bc she was having back pain and wanted to schedule a f/u. At the moment she is much better. She would like to hold off right now and will call back to schedule if things change. AB

## 2022-08-30 ENCOUNTER — Other Ambulatory Visit: Payer: Self-pay

## 2022-08-30 DIAGNOSIS — D500 Iron deficiency anemia secondary to blood loss (chronic): Secondary | ICD-10-CM

## 2022-08-31 ENCOUNTER — Inpatient Hospital Stay: Payer: Medicare Other | Attending: Hematology and Oncology

## 2022-09-15 ENCOUNTER — Telehealth: Payer: Self-pay | Admitting: Internal Medicine

## 2022-09-15 DIAGNOSIS — I480 Paroxysmal atrial fibrillation: Secondary | ICD-10-CM

## 2022-09-15 MED ORDER — APIXABAN 5 MG PO TABS: ORAL_TABLET | ORAL | 0 refills | Status: DC

## 2022-09-15 NOTE — Telephone Encounter (Signed)
*  STAT* If patient is at the pharmacy, call can be transferred to refill team.   1. Which medications need to be refilled? (please list name of each medication and dose if known)   apixaban (ELIQUIS) 5 MG TABS tablet    2. Which pharmacy/location (including street and city if local pharmacy) is medication to be sent to?   [facility], [place] - [address] AT [place]    3. Do they need a 30 day or 90 day supply? [place]

## 2022-09-15 NOTE — Telephone Encounter (Signed)
Pt last saw Dr Caryl Comes 11/17/21, last labs 08/16/22 Creat 0.73, age 82, weight 59.4kg, pt's weight is borderline for needing a dosage change if pt continues to lose weight.  Will refill x 3 months then reassess weight at that time.  Weight has decreased from 142 lbs 02/07/22 to 131 lbs 08/16/22 (in last 6 months) Pt is due for follow-up with Dr Caryl Comes 10/2022 placed note on reminder to assess weight for possible need for Eliquis dosage change.

## 2022-09-20 ENCOUNTER — Other Ambulatory Visit: Payer: Self-pay | Admitting: Internal Medicine

## 2022-09-23 NOTE — Telephone Encounter (Signed)
Per chart note from 07/08/2022: cotninue prednisone to 5 mg per day + myfortic and bactrim at current dose. Followup -3 months with Dr Chase Caller - 30 min visit  Patient has OV for 11/24/2022 with Dr. Chase Caller.

## 2022-10-07 NOTE — Progress Notes (Signed)
Office Visit Note  Patient: [name]             Date of Birth: 11/11/1940           MRN: [id number]             PCP: Sigmund Hazel, MD Referring: Sigmund Hazel, MD Visit Date: 10/21/2022 Occupation: @GUAROCC @  Subjective:  Medication management  History of Present Illness: [name] is a 82 y.o. female with history of scleroderma and interstitial lung disease.  She was evaluated by Dr. Colletta Maryland on July 08, 2022.  I reviewed the chart.  According to his last note her PFTs were stable and CT scan from December 2023 showed mild ILD.  No change in treatment was advised.  Echocardiogram in January 2024 was normal.  She denies any increased shortness of breath.  She continues to be on prednisone 5 mg p.o. daily.  She is on Myfortic 360 mg p.o. twice daily and Bactrim DS prophylaxis 3 times a week. Patient developed L2 compression fracture in December and underwent kyphoplasty in January.  She had another compression fracture and February and underwent kyphoplasty for L1 compression fracture.  She has recovered from the compression fractures.  She had Reclast IV infusion on August 23, 2022 by her PCP.  She states she has been taking calcium and increase the dose of vitamin D to 5000 units daily. She denies increased skin tightness, sclerodactyly, [name] symptoms.  She has not noticed any digital ulcers.  She notices skin changes from the use of Eliquis.  She has not had any recent problems with trochanteric bursitis.  She has osteoarthritis in her hands which causes a stiffness. She has been seeing Dr. Leonides Schanz for hemochromatosis.  Activities of Daily Living:  Patient reports morning stiffness for a few minutes.   Patient Reports nocturnal pain.  Difficulty dressing/grooming: Denies Difficulty climbing stairs: Denies Difficulty getting out of chair: Denies Difficulty using hands for taps, buttons, cutlery, and/or writing: Denies  Review of Systems  Constitutional:   Positive for fatigue.  HENT:  Positive for mouth dryness. Negative for mouth sores.   Eyes:  Negative for dryness.  Respiratory:  Negative for shortness of breath.   Cardiovascular:  Negative for chest pain and palpitations.  Gastrointestinal:  Negative for blood in stool, constipation and diarrhea.  Endocrine: Negative for increased urination.  Genitourinary:  Negative for involuntary urination.  Musculoskeletal:  Positive for myalgias, morning stiffness and myalgias. Negative for joint pain, gait problem, joint pain, joint swelling, muscle weakness and muscle tenderness.  Skin:  Negative for color change, rash, hair loss and sensitivity to sunlight.  Allergic/Immunologic: Negative for susceptible to infections.  Neurological:  Negative for dizziness and headaches.  Hematological:  Negative for swollen glands.  Psychiatric/Behavioral:  Positive for sleep disturbance. Negative for depressed mood. The patient is not nervous/anxious.     PMFS History:  Patient Active Problem List   Diagnosis Date Noted   Malnutrition of moderate degree 06/25/2022   Closed compression fracture of L2 lumbar vertebra, initial encounter (HCC) 06/21/2022   Intractable back pain 06/20/2022   High risk medication use 02/18/2019   Therapeutic drug monitoring 02/18/2019   Healthcare maintenance 02/18/2019   ILD (interstitial lung disease) (HCC) 11/28/2016   Multiple lung nodules on CT 11/28/2016   Abnormal laboratory test 11/18/2016   DDD L spine 11/18/2016   Scl-70 antibody positive 11/10/2016   History of hypothyroidism 11/10/2016   History of hemochromatosis 11/10/2016   Pulmonary emphysema (HCC)  08/31/2016   [name] phenomenon without gangrene 08/31/2016   Sinusitis, chronic 08/31/2016   Chronic cough 02/10/2016   Irritable larynx 02/10/2016   Stopped smoking with greater than 40 pack year history 02/10/2016   Chest pain, unspecified 10/06/2010   Shortness of breath 10/06/2010   Hypothyroidism     Hereditary hemochromatosis (HCC) 04/22/2010   ATRIAL FIBRILLATION 10/20/2008   PAC 10/20/2008   SYNCOPE AND COLLAPSE 10/20/2008    Past Medical History:  Diagnosis Date   Atrial fibrillation (HCC)    a. Flecainide therapy;  b. event monitor 4/12   Chest pain    a. GXT myoview 4/12: no isch., EF 86%;   b. 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 Cyndie Chime   History of syncope    Hypothyroidism    Osteoporosis     Family History  Problem Relation Age of Onset   Stroke Mother    Heart disease Father 37   Hemochromatosis Brother    Breast cancer Neg Hx    Past Surgical History:  Procedure Laterality Date   IR KYPHO LUMBAR INC FX REDUCE BONE BX UNI/BIL CANNULATION INC/IMAGING  06/30/2022   IR KYPHO LUMBAR INC FX REDUCE BONE BX UNI/BIL CANNULATION INC/IMAGING  08/16/2022   MOUTH SURGERY     SHOULDER ARTHROSCOPY Left    TOTAL ABDOMINAL HYSTERECTOMY     VARICOSE VEIN SURGERY     Social History   Social History Narrative   REGULAR EXERCISE   Immunization History  Administered Date(s) Administered   Fluad Quad(high Dose 65+) 02/18/2019, 04/21/2022   Influenza Split 03/27/2019   Influenza, High Dose Seasonal PF 08/29/2017, 04/26/2018, 04/29/2020, 05/12/2021   PFIZER(Purple Top)SARS-COV-2 Vaccination 07/22/2019, 08/12/2019, 03/11/2020   Pneumococcal Conjugate-13 06/12/2014   Pneumococcal Polysaccharide-23 06/27/2010, 04/25/2018   Tdap 03/16/2011, 07/05/2019   Zoster Recombinat (Shingrix) 04/08/2019   Zoster, Live 06/27/2010, 04/08/2019     Objective: Vital Signs: BP 112/63 (BP Location: Left Arm, Patient Position: Sitting, Cuff Size: Normal)   Pulse (!) 49   Resp 16   Ht 5' 3.5" (1.613 m)   Wt 131 lb 6.4 oz (59.6 kg)   BMI 22.91 kg/m    Physical Exam Vitals and nursing note reviewed.  Constitutional:      Appearance: She is  well-developed.  HENT:     Head: Normocephalic and atraumatic.  Eyes:     Conjunctiva/sclera: Conjunctivae normal.  Cardiovascular:     Rate and Rhythm: Normal rate and regular rhythm.     Heart sounds: Normal heart sounds.  Pulmonary:     Effort: Pulmonary effort is normal.     Breath sounds: Normal breath sounds.  Abdominal:     General: Bowel sounds are normal.     Palpations: Abdomen is soft.  Musculoskeletal:     Cervical back: Normal range of motion.  Lymphadenopathy:     Cervical: No cervical adenopathy.  Skin:    General: Skin is warm and dry.     Capillary Refill: Capillary refill takes 2 to 3 seconds.     Comments: No sclerodactyly was noted.  Few telangiectasias were noted on her neck.  She had good capillary refill.  Neurological:     Mental Status: She is alert and oriented to person, place, and time.  Psychiatric:        Behavior: Behavior normal.  Musculoskeletal Exam: She had limited lateral rotation of the cervical spine.  She had discomfort range of motion of her lumbar spine.  Shoulders, elbows, wrist joints, MCPs PIPs and DIPs were in good range of motion.  She had bilateral CMC, PIP and DIP thickening.  No synovitis was noted.  Hip joints and knee joints in good range of motion.  She had no tenderness over ankles or MTPs.  CDAI Exam: CDAI Score: -- Patient Global: --; Provider Global: -- Swollen: --; Tender: -- Joint Exam 10/21/2022   No joint exam has been documented for this visit   There is currently no information documented on the homunculus. Go to the Rheumatology activity and complete the homunculus joint exam.  Investigation: No additional findings.  Imaging: No results found.  Recent Labs: Lab Results  Component Value Date   WBC 6.4 10/10/2022   HGB 12.9 10/10/2022   PLT 210 10/10/2022   NA 138 10/10/2022   K 3.9 10/10/2022   CL 103 10/10/2022   CO2 29 10/10/2022   GLUCOSE 87 10/10/2022   BUN 14 10/10/2022   CREATININE 0.64  10/10/2022   BILITOT 0.5 10/10/2022   ALKPHOS 54 10/10/2022   AST 16 10/10/2022   ALT 13 10/10/2022   PROT 6.6 10/10/2022   ALBUMIN 4.3 10/10/2022   CALCIUM 9.5 10/10/2022   GFRAA 101 06/09/2020   QFTBGOLDPLUS NEGATIVE 08/10/2017      Speciality Comments: Reclast IV August 23, 2022  Procedures:  No procedures performed Allergies: Patient has no known allergies.   Assessment / Plan:     Visit Diagnoses: Scleroderma (HCC) - Nailbed capillary changes, anticardiolipin antibody+, Scl-70+, ILD: She continues to have mild Raynauds symptoms.  She has not noticed digital ulcers.  She denies any history of joint swelling.  She denies any shortness of breath.- Plan: Protein / creatinine ratio, urine, Sedimentation rate, C3 and C4  Anticardiolipin antibody positive -she has low titer positive anticardiolipin antibodies.  Positive anticardiolipin IgM 47, positive beta-2 IgM 31.  She remains on Eliquis as prescribed for atrial fibrillation. - Plan: Beta-2 glycoprotein antibodies, Cardiolipin antibodies, IgG, IgM, IgA  ILD (interstitial lung disease) (HCC) -by Dr. Marchelle Gearing.  High-resolution chest CT updated on 08/24/2021 revealed minimal lower lung predominant subpleural reticular opacities-no evidence of progression.  PFTs were stable.  Change in treatment was advised.  High risk medication use - Myfortic 360 mg 1 tablet by mouth twice daily, Bactrim DS on Mondays, Wednesdays, and Fridays and prednisone 5 mg 1 tablet by mouth daily by Dr. Marchelle Gearing.  Long term systemic steroid user - prednisone 5 mg p.o. daily for ILD by Dr. Marchelle Gearing.  Primary osteoarthritis of both hands-she has bilateral CMC PIP and DIP thickening with no synovitis.  Trochanteric bursitis of both hips-the symptoms resolved after her recent kyphoplasty per patient.  DDD (degenerative disc disease), lumbar-she continues to have some discomfort in her lower back.  Core strengthening exercises were discussed.  Age-related  osteoporosis without current pathological fracture -May 02, 2022 T-score -2.9, BMD 0.528 left femoral neck, no comparison available.  DEXA scan from Nov 04, 2019 T score -2.9 left femoral neck, BMD 0.532. Declined tx in the past. -There was no comparison available for the left femoral neck on the report.  Her bone density is basically unchanged.  She is not taking any treatment in the past.  She was given Reclast IV infusion by her PCP on August 23, 2022.  With a recent history of vertebral fractures I discussed the option of  Forteo or Tymlos which she declined.  She is also apprehensive about Evenity use due to atrial fibrillation.  Increased efficacy of anabolic agents was discussed.  At this point we will continue Reclast calcium and vitamin D.  Need for regular exercise was also emphasized.  I will obtain additional labs today.  Plan: Parathyroid hormone, intact (no Ca), VITAMIN D 25 Hydroxy (Vit-D Deficiency, Fractures), Phosphorus, TSH, Serum protein electrophoresis with reflex  Other closed fracture of lumbar vertebra, L1, L2 sequela - L2 compression fracture December 2023, L1 compression fracture 2024 s/p kyphoplasty x 2.  Multiple lung nodules on CT-followed by Dr. Colletta Maryland.  History of atrial fibrillation - followed by Dr. Graciela Husbands  History of emphysema [facility])  Former smoker  Vitamin D deficiency - He is currently on Vitamin D 5000 units daily x 1 month - Plan: VITAMIN D 25 Hydroxy (Vit-D Deficiency, Fractures)  History of hemochromatosis - Followed by Dr. Leonides Schanz  Low ferritin  History of hypothyroidism  Orders: Orders Placed This Encounter  Procedures   Parathyroid hormone, intact (no Ca)   VITAMIN D 25 Hydroxy (Vit-D Deficiency, Fractures)   Phosphorus   TSH   Serum protein electrophoresis with reflex   Protein / creatinine ratio, urine   Sedimentation rate   C3 and C4   Beta-2 glycoprotein antibodies   Cardiolipin antibodies, IgG, IgM, IgA   Meds ordered this  encounter  Medications   DISCONTD: methocarbamol (ROBAXIN) 500 MG tablet    Sig: Take 1 tablet (500 mg total) by mouth daily as needed for muscle spasms.    Dispense:  30 tablet    Refill:  0   methocarbamol (ROBAXIN) 500 MG tablet    Sig: Take 1 tablet (500 mg total) by mouth daily as needed for muscle spasms.    Dispense:  30 tablet    Refill:  0     Follow-Up Instructions: No follow-ups on file.   Pollyann Savoy, MD  Note - This record has been created using Animal nutritionist.  Chart creation errors have been sought, but may not always  have been located. Such creation errors do not reflect on  the standard of medical care.

## 2022-10-10 ENCOUNTER — Inpatient Hospital Stay: Payer: Medicare Other | Attending: Hematology and Oncology

## 2022-10-10 ENCOUNTER — Inpatient Hospital Stay: Payer: Medicare Other | Admitting: Hematology and Oncology

## 2022-10-10 ENCOUNTER — Other Ambulatory Visit: Payer: Self-pay

## 2022-10-10 ENCOUNTER — Other Ambulatory Visit: Payer: Self-pay | Admitting: Acute Care

## 2022-10-10 VITALS — BP 131/58 | HR 58 | Temp 97.4°F | Resp 16 | Wt 132.0 lb

## 2022-10-10 DIAGNOSIS — Z87891 Personal history of nicotine dependence: Secondary | ICD-10-CM | POA: Insufficient documentation

## 2022-10-10 DIAGNOSIS — D500 Iron deficiency anemia secondary to blood loss (chronic): Secondary | ICD-10-CM

## 2022-10-10 LAB — CMP (CANCER CENTER ONLY)
ALT: 13 U/L (ref 0–44)
AST: 16 U/L (ref 15–41)
Albumin: 4.3 g/dL (ref 3.5–5.0)
Alkaline Phosphatase: 54 U/L (ref 38–126)
Anion gap: 6 (ref 5–15)
BUN: 14 mg/dL (ref 8–23)
CO2: 29 mmol/L (ref 22–32)
Calcium: 9.5 mg/dL (ref 8.9–10.3)
Chloride: 103 mmol/L (ref 98–111)
Creatinine: 0.64 mg/dL (ref 0.44–1.00)
GFR, Estimated: >60 mL/min (ref >60)
Glucose, Bld: 87 mg/dL (ref 70–99)
Potassium: 3.9 mmol/L (ref 3.5–5.1)
Sodium: 138 mmol/L (ref 135–145)
Total Bilirubin: 0.5 mg/dL (ref 0.3–1.2)
Total Protein: 6.6 g/dL (ref 6.5–8.1)

## 2022-10-10 LAB — CBC WITH DIFFERENTIAL (CANCER CENTER ONLY)
Abs Immature Granulocytes: 0.02 10*3/uL (ref 0.00–0.07)
Basophils Absolute: 0.1 10*3/uL (ref 0.0–0.1)
Basophils Relative: 1 %
Eosinophils Absolute: 0.1 10*3/uL (ref 0.0–0.5)
Eosinophils Relative: 2 %
HCT: 38.3 % (ref 36.0–46.0)
Hemoglobin: 12.9 g/dL (ref 12.0–15.0)
Immature Granulocytes: 0 %
Lymphocytes Relative: 13 %
Lymphs Abs: 0.8 10*3/uL (ref 0.7–4.0)
MCH: 35.5 pg — ABNORMAL HIGH (ref 26.0–34.0)
MCHC: 33.7 g/dL (ref 30.0–36.0)
MCV: 105.5 fL — ABNORMAL HIGH (ref 80.0–100.0)
Monocytes Absolute: 0.5 10*3/uL (ref 0.1–1.0)
Monocytes Relative: 8 %
Neutro Abs: 4.9 10*3/uL (ref 1.7–7.7)
Neutrophils Relative %: 76 %
Platelet Count: 210 10*3/uL (ref 150–400)
RBC: 3.63 MIL/uL — ABNORMAL LOW (ref 3.87–5.11)
RDW: 13.8 % (ref 11.5–15.5)
WBC Count: 6.4 10*3/uL (ref 4.0–10.5)
nRBC: 0 % (ref 0.0–0.2)

## 2022-10-10 LAB — FERRITIN: Ferritin: 44 ng/mL (ref 11–307)

## 2022-10-10 LAB — RETIC PANEL
Immature Retic Fract: 12.9 % (ref 2.3–15.9)
RBC.: 3.59 MIL/uL — ABNORMAL LOW (ref 3.87–5.11)
Retic Count, Absolute: 54.9 10*3/uL (ref 19.0–186.0)
Retic Ct Pct: 1.5 % (ref 0.4–3.1)
Reticulocyte Hemoglobin: 39.8 pg (ref >27.9)

## 2022-10-10 LAB — IRON AND IRON BINDING CAPACITY (CC-WL,HP ONLY)
Iron: 216 ug/dL — ABNORMAL HIGH (ref 28–170)
Saturation Ratios: 88 % — ABNORMAL HIGH (ref 10.4–31.8)
TIBC: 246 ug/dL — ABNORMAL LOW (ref 250–450)
UIBC: 30 ug/dL — ABNORMAL LOW (ref 148–442)

## 2022-10-10 NOTE — Progress Notes (Signed)
[name]:([phone]   Fax:([phone]  PROGRESS NOTE  Patient Care Team: Sigmund Hazel, MD as PCP - General (Family Medicine) Duke Salvia, MD as PCP - Cardiology (Cardiology) Duke Salvia, MD as Consulting Physician (Cardiology) Sharrell Ku, MD as Consulting Physician (Gastroenterology) Jaci Standard, MD as Consulting Physician (Hematology and Oncology)  Hematological/Oncological History # Iron Deficiency Anemia  # Hereditary Hemochromatosis C282Y Homozygous 09/11/2017: last visit with Dr. Cyndie Chime 05/09/2022: establish care with Dr. Leonides Schanz   Interval History:  [name] 82 y.o. female with medical history significant for iron deficiency anemia in setting of hereditary hemochromatosis who presents for a follow up visit. The patient's last visit was on 07/18/2022 at which time she established care. In the interim since the last visit she has continued on PO iron therapy.  On exam today [name] reports that she has had no changes in her health in the interim since her last visit.  She reports her energy today is pretty good and currently ranks it as about a 7 out of 10.  She reports that she is eating well and that she is not having any difficulty with the iron pills other than some occasional constipation.  She takes them faithfully every day.  She has noticed a "gradual boost in her energy levels".  Overall she feels pretty good.  She denies any overt signs of bleeding, bruising, or dark stools.  She notes that she does not eat much in the way of red meat but does enjoy eating salads and greens.  At this time she was agreeable to holding on iron therapy as her hemoglobin has normalized and her iron stores are increasing appropriately.  She notes that she is not having any lightheadedness, dizziness, or shortness of breath.  A full 10 point ROS was otherwise negative.  MEDICAL HISTORY:  Past Medical History:  Diagnosis Date    Atrial fibrillation (HCC)    a. Flecainide therapy;  b. event monitor 4/12   Chest pain    a. GXT myoview 4/12: no isch., EF 86%;   b. 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 Cyndie Chime   History of syncope    Hypothyroidism    Osteoporosis     SURGICAL HISTORY: Past Surgical History:  Procedure Laterality Date   IR KYPHO LUMBAR INC FX REDUCE BONE BX UNI/BIL CANNULATION INC/IMAGING  06/30/2022   IR KYPHO LUMBAR INC FX REDUCE BONE BX UNI/BIL CANNULATION INC/IMAGING  08/16/2022   MOUTH SURGERY     SHOULDER ARTHROSCOPY Left    TOTAL ABDOMINAL HYSTERECTOMY     VARICOSE VEIN SURGERY      SOCIAL HISTORY: Social History   Socioeconomic History   Marital status: Married    Spouse name: Not on file   Number of children: Not on file   Years of education: Not on file   Highest education level: Not on file  Occupational History   Occupation: RETIRED    Employer: RETIRED  Tobacco Use   Smoking status: Former    Packs/day: 1.00    Years: 42.00    Additional pack years: 0.00    Total pack years: 42.00    Types: Cigarettes    Quit date: 06/27/1997    Years since quitting: 25.3    Passive exposure: Never   Smokeless  tobacco: Never  Vaping Use   Vaping Use: Never used  Substance and Sexual Activity   Alcohol use: Yes    Comment: occ   Drug use: No   Sexual activity: Not on file  Other Topics Concern   Not on file  Social History Narrative   REGULAR EXERCISE   Social Determinants of Health   Financial Resource Strain: Not on file  Food Insecurity: No Food Insecurity (06/20/2022)   Hunger Vital Sign    Worried About Running Out of Food in the Last Year: Never true    Ran Out of Food in the Last Year: Never true  Transportation Needs: No Transportation Needs (06/20/2022)   PRAPARE - Administrator, Civil Service (Medical):  No    Lack of Transportation (Non-Medical): No  Physical Activity: Not on file  Stress: Not on file  Social Connections: Not on file  Intimate Partner Violence: Not At Risk (06/20/2022)   Humiliation, Afraid, Rape, and Kick questionnaire    Fear of Current or Ex-Partner: No    Emotionally Abused: No    Physically Abused: No    Sexually Abused: No    FAMILY HISTORY: Family History  Problem Relation Age of Onset   Stroke Mother    Heart disease Father 75   Hemochromatosis Brother    Breast cancer Neg Hx     ALLERGIES:  has No Known Allergies.  MEDICATIONS:  Current Outpatient Medications  Medication Sig Dispense Refill   acetaminophen (TYLENOL) 325 MG tablet Take 650 mg by mouth every 6 (six) hours as needed for mild pain or headache.     apixaban (ELIQUIS) 5 MG TABS tablet TAKE 1 TABLET(5 MG) BY MOUTH TWICE DAILY.  Due for yearly follow-up with Dr Graciela Husbands, please call office [phone] for appointment. 180 tablet 0   atorvastatin (LIPITOR) 10 MG tablet Take 10 mg by mouth daily.     cyanocobalamin (VITAMIN B12) 500 MCG tablet Take 500 mcg by mouth daily.     ferrous sulfate (FEROSUL) 325 (65 FE) MG tablet TAKE 1 TABLET BY MOUTH EVERY DAY. FOR BEST ABSORPTION TAKE ON AN EMPTY STOMACH WITH FRUIT JUICE. 90 tablet 1   flecainide (TAMBOCOR) 100 MG tablet Take 1 tablet (100 mg total) by mouth 2 (two) times daily. 180 tablet 3   levothyroxine (SYNTHROID, LEVOTHROID) 112 MCG tablet Take 112 mcg by mouth daily.     methocarbamol (ROBAXIN) 500 MG tablet Take 1-2 tablets (500-1,000 mg total) by mouth every 6 (six) hours as needed for muscle spasms. 60 tablet 0   metoprolol succinate (TOPROL-XL) 25 MG 24 hr tablet TAKE 1 TABLET BY MOUTH  DAILY (Patient taking differently: Take 25 mg by mouth daily.) 90 tablet 2   mycophenolate (MYFORTIC) 360 MG TBEC EC tablet TAKE 1 TABLET BY MOUTH TWICE DAILY 180 tablet 3   nitroGLYCERIN (NITROGLYN) 2 % ointment Apply 1/4 inch to fingertips three times daily  as needed. 30 g 1   ondansetron (ZOFRAN ODT) 4 MG disintegrating tablet Take 1-2 tablets three times daily as needed (Patient taking differently: Take 4 mg by mouth every 8 (eight) hours as needed for nausea or vomiting.) 42 tablet 1   ondansetron (ZOFRAN) 4 MG tablet Take 1 tablet (4 mg total) by mouth every 8 (eight) hours as needed for nausea or vomiting. 20 tablet 0   oxyCODONE (OXY IR/ROXICODONE) 5 MG immediate release tablet Take 1 tablet (5 mg total) by mouth every 3 (three) hours as needed for moderate  pain or breakthrough pain. 30 tablet 0   pantoprazole (PROTONIX) 20 MG tablet Take 20 mg by mouth 2 (two) times daily.     potassium chloride (KLOR-CON M) 10 MEQ tablet Take 1 tablet (10 mEq total) by mouth daily. 7 tablet 0   predniSONE (DELTASONE) 5 MG tablet TAKE 1 TABLET(5 MG) BY MOUTH DAILY WITH BREAKFAST 90 tablet 0   sorbitol 70 % SOLN Take 15 mLs by mouth daily as needed for moderate constipation. 3800 mL 0   SPIRIVA RESPIMAT 2.5 MCG/ACT AERS INHALE 2 PUFFS INTO THE LUNGS DAILY 4 g 10   sulfamethoxazole-trimethoprim (BACTRIM DS) 800-160 MG tablet TAKE 1 TABLET BY MOUTH ON MONDAY, WEDNESDAY, AND FRIDAY (Patient taking differently: Take 1 tablet by mouth See admin instructions. Take one tablet by mouth on Mondays Wednesdays and Fridays per patient) 90 tablet 3   Vitamin D, Cholecalciferol, 25 MCG (1000 UT) TABS Take 1 tablet by mouth daily.     No current facility-administered medications for this visit.    REVIEW OF SYSTEMS:   Constitutional: ( - ) fevers, ( - )  chills , ( - ) night sweats Eyes: ( - ) blurriness of vision, ( - ) double vision, ( - ) watery eyes Ears, nose, mouth, throat, and face: ( - ) mucositis, ( - ) sore throat Respiratory: ( - ) cough, ( - ) dyspnea, ( - ) wheezes Cardiovascular: ( - ) palpitation, ( - ) chest discomfort, ( - ) lower extremity swelling Gastrointestinal:  ( - ) nausea, ( - ) heartburn, ( - ) change in bowel habits Skin: ( - ) abnormal skin  rashes Lymphatics: ( - ) new lymphadenopathy, ( - ) easy bruising Neurological: ( - ) numbness, ( - ) tingling, ( - ) new weaknesses Behavioral/Psych: ( - ) mood change, ( - ) new changes  All other systems were reviewed with the patient and are negative.  PHYSICAL EXAMINATION:  Vitals:   10/10/22 1032  BP: (!) 131/58  Pulse: (!) 58  Resp: 16  Temp: (!) 97.4 F (36.3 C)  SpO2: 98%    Filed Weights   10/10/22 1032  Weight: 132 lb (59.9 kg)     GENERAL: Well-appearing elderly Caucasian female, alert, no distress and comfortable SKIN: skin color, texture, turgor are normal, no rashes or significant lesions EYES: conjunctiva are pink and non-injected, sclera clear LUNGS: clear to auscultation and percussion with normal breathing effort HEART: regular rate & rhythm and no murmurs and no lower extremity edema Musculoskeletal: no cyanosis of digits and no clubbing  PSYCH: alert & oriented x 3, fluent speech NEURO: no focal motor/sensory deficits  LABORATORY DATA:  I have reviewed the data as listed    Latest Ref Rng & Units 10/10/2022    9:48 AM 08/16/2022    7:12 AM 07/13/2022    8:49 AM  CBC  WBC 4.0 - 10.5 K/uL 6.4  5.0  8.3   Hemoglobin 12.0 - 15.0 g/dL 40.9  81.1  91.4   Hematocrit 36.0 - 46.0 % 38.3  40.0  34.4   Platelets 150 - 400 K/uL 210  286  243        Latest Ref Rng & Units 10/10/2022    9:48 AM 08/16/2022    7:12 AM 07/13/2022    8:49 AM  CMP  Glucose 70 - 99 mg/dL 87  83  83   BUN 8 - 23 mg/dL Creatinine 0.44 -  1.00 mg/dL 1.61  0.96  0.45   Sodium 135 - 145 mmol/L 138  138  142   Potassium 3.5 - 5.1 mmol/L 3.9  3.5  3.1   Chloride 98 - 111 mmol/L 103  101  106   CO2 22 - 32 mmol/L 29  27  30    Calcium 8.9 - 10.3 mg/dL 9.5  9.6  8.9   Total Protein 6.5 - 8.1 g/dL 6.6   5.7   Total Bilirubin 0.3 - 1.2 mg/dL 0.5   0.4   Alkaline Phos 38 - 126 U/L 54   75   AST 15 - 41 U/L 16   13   ALT 0 - 44 U/L 13   10    . RADIOGRAPHIC STUDIES: No  results found.  ASSESSMENT & PLAN [name] 82 y.o. female with medical history significant for iron deficiency anemia in setting of hereditary hemochromatosis who presents for a follow up visit.  After review of the labs, review of the records, and discussion with the patient the patients findings are most consistent with iron deficiency anemia complicated by hereditary hemochromatosis.   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  She has not undergone phlebotomy since 2012. -- full nutritional work-up show low iron levels.  -- Due to anemia in the setting of hemochromatosis we will need to replete her with PO therapy gently. --labs today show white blood cell 6.4, hemoglobin 12.9, MCV 105.5, and platelets of 210.  Ferritin is 44 with an iron saturation of 88%. --RTC in 3 months time with repeat labs in 6 weeks.   No orders of the defined types were placed in this encounter.   All questions were answered. The patient knows to call the clinic with any problems, questions or concerns.  A total of more than 30 minutes were spent on this encounter with face-to-face time and non-face-to-face time, including preparing to see the patient, ordering tests and/or medications, counseling the patient and coordination of care as outlined above.   Ulysees Barns, MD Department of Hematology/Oncology [facility] Phone: [phone] Pager: [phone] Email: [email or web address]  10/10/2022 3:13 PM

## 2022-10-21 ENCOUNTER — Encounter: Payer: Self-pay | Admitting: Rheumatology

## 2022-10-21 ENCOUNTER — Ambulatory Visit: Payer: Medicare Other | Attending: Rheumatology | Admitting: Rheumatology

## 2022-10-21 VITALS — BP 112/63 | HR 49 | Resp 16 | Ht 63.5 in | Wt 131.4 lb

## 2022-10-21 DIAGNOSIS — M19042 Primary osteoarthritis, left hand: Secondary | ICD-10-CM

## 2022-10-21 DIAGNOSIS — M7061 Trochanteric bursitis, right hip: Secondary | ICD-10-CM

## 2022-10-21 DIAGNOSIS — E559 Vitamin D deficiency, unspecified: Secondary | ICD-10-CM

## 2022-10-21 DIAGNOSIS — J439 Emphysema, unspecified: Secondary | ICD-10-CM

## 2022-10-21 DIAGNOSIS — Z87891 Personal history of nicotine dependence: Secondary | ICD-10-CM

## 2022-10-21 DIAGNOSIS — R760 Raised antibody titer: Secondary | ICD-10-CM | POA: Diagnosis not present

## 2022-10-21 DIAGNOSIS — S32008S Other fracture of unspecified lumbar vertebra, sequela: Secondary | ICD-10-CM

## 2022-10-21 DIAGNOSIS — Z8679 Personal history of other diseases of the circulatory system: Secondary | ICD-10-CM

## 2022-10-21 DIAGNOSIS — M810 Age-related osteoporosis without current pathological fracture: Secondary | ICD-10-CM

## 2022-10-21 DIAGNOSIS — R790 Abnormal level of blood mineral: Secondary | ICD-10-CM

## 2022-10-21 DIAGNOSIS — M7062 Trochanteric bursitis, left hip: Secondary | ICD-10-CM

## 2022-10-21 DIAGNOSIS — Z7952 Long term (current) use of systemic steroids: Secondary | ICD-10-CM

## 2022-10-21 DIAGNOSIS — M349 Systemic sclerosis, unspecified: Secondary | ICD-10-CM

## 2022-10-21 DIAGNOSIS — J849 Interstitial pulmonary disease, unspecified: Secondary | ICD-10-CM

## 2022-10-21 DIAGNOSIS — Z79899 Other long term (current) drug therapy: Secondary | ICD-10-CM | POA: Diagnosis not present

## 2022-10-21 DIAGNOSIS — M5136 Other intervertebral disc degeneration, lumbar region: Secondary | ICD-10-CM

## 2022-10-21 DIAGNOSIS — Z8639 Personal history of other endocrine, nutritional and metabolic disease: Secondary | ICD-10-CM

## 2022-10-21 DIAGNOSIS — M19041 Primary osteoarthritis, right hand: Secondary | ICD-10-CM

## 2022-10-21 DIAGNOSIS — R918 Other nonspecific abnormal finding of lung field: Secondary | ICD-10-CM

## 2022-10-21 MED ORDER — METHOCARBAMOL 500 MG PO TABS: 500.0000 mg | ORAL_TABLET | Freq: Every day | ORAL | 0 refills | Status: DC | PRN

## 2022-10-21 NOTE — Addendum Note (Signed)
Addended by: Pollyann Savoy on: 10/21/2022 12:10 PM   Modules accepted: Level of Service

## 2022-10-22 LAB — PROTEIN / CREATININE RATIO, URINE
Creatinine, Urine: 34 mg/dL (ref 20–275)
Protein/Creat Ratio: 176 mg/g creat (ref 24–184)

## 2022-10-22 LAB — VITAMIN D 25 HYDROXY (VIT D DEFICIENCY, FRACTURES): Vit D, 25-Hydroxy: 43 ng/mL (ref 30–100)

## 2022-10-23 LAB — PROTEIN / CREATININE RATIO, URINE: Protein/Creatinine Ratio: 0.176 mg/mg creat (ref 0.024–0.184)

## 2022-10-24 LAB — PROTEIN ELECTROPHORESIS, SERUM, WITH REFLEX: Albumin ELP: 4.5 g/dL (ref 3.8–4.8)

## 2022-10-24 LAB — SEDIMENTATION RATE: Sed Rate: 2 mm/h (ref 0–30)

## 2022-10-26 LAB — PROTEIN ELECTROPHORESIS, SERUM, WITH REFLEX
Alpha 2: 0.7 g/dL (ref 0.5–0.9)
Beta Globulin: 0.4 g/dL (ref 0.4–0.6)

## 2022-10-26 LAB — PHOSPHORUS: Phosphorus: 3.1 mg/dL (ref 2.1–4.3)

## 2022-10-26 LAB — CARDIOLIPIN ANTIBODIES, IGG, IGM, IGA
Anticardiolipin IgA: 35.7 APL-U/mL — ABNORMAL HIGH (ref <20.0)
Anticardiolipin IgM: 6.2 MPL-U/mL (ref <20.0)

## 2022-10-28 ENCOUNTER — Telehealth: Payer: Self-pay | Admitting: *Deleted

## 2022-10-28 LAB — PARATHYROID HORMONE, INTACT (NO CA): PTH: 60 pg/mL (ref 16–77)

## 2022-10-28 LAB — IFE INTERPRETATION

## 2022-10-28 LAB — BETA-2 GLYCOPROTEIN ANTIBODIES
Beta-2 Glyco 1 IgA: 29.4 U/mL — ABNORMAL HIGH (ref <20.0)
Beta-2 Glyco 1 IgM: 8.6 U/mL (ref <20.0)
Beta-2 Glyco I IgG: <2 U/mL (ref <20.0)

## 2022-10-28 LAB — C3 AND C4
C3 Complement: 126 mg/dL
C4 Complement: 26 mg/dL

## 2022-10-28 LAB — PROTEIN ELECTROPHORESIS, SERUM, WITH REFLEX
Alpha 1: 0.3 g/dL (ref 0.2–0.3)
Beta 2: 0.3 g/dL (ref 0.2–0.5)
Gamma Globulin: 0.6 g/dL — ABNORMAL LOW (ref 0.8–1.7)
Total Protein: 6.8 g/dL (ref 6.1–8.1)

## 2022-10-28 LAB — TSH: TSH: 1.23 mIU/L (ref 0.40–4.50)

## 2022-10-28 LAB — CARDIOLIPIN ANTIBODIES, IGG, IGM, IGA: Anticardiolipin IgG: <2 GPL-U/mL (ref <20.0)

## 2022-10-28 LAB — PROTEIN / CREATININE RATIO, URINE: Total Protein, Urine: 6 mg/dL (ref 5–24)

## 2022-10-28 NOTE — Telephone Encounter (Signed)
Received a potential clinical concern from [employer].  Potential Clinical Concern: Opioid Rick Management >=4 prescribers of muscle relaxants  Possible Risks: Use of 4 or more prescribers may indicate abuse potential or inappropriate use of muscle relaxants. There are also coordination of care concerns when a patient receives muscle relaxants from multiple prescribers.   Reviewed by: Sherron Ales, PA-C  Recommendations: Advise patient to try to limit use of Robaxin to only PRN   Attempted to contact the patient and left message for patient to call the office.

## 2022-11-23 NOTE — Patient Instructions (Signed)
Post viral reactive cough  - noted that you suspect cough contributed to L spine fracture  Plan -contiue deslym as needed  - start teassalon cough perles 200mg three times daily as needed   Interstitial pulmonary disease (HCC) Scl-70 antibody positive [name] phenomenon without gangrene High risk medication use Therapeutic drug monitoring   - CLinically stable and PFT sble. CT dec 2023 shows mild ILD  Plan -Monitor pulse ox levels at home --Otherwise  cotninue  prednisone to 5 mg per day +  myfortic and bactrim at current dose  - appreciate future interest in clinical trials - await echo  Pulmonary emphysema, unspecified emphysema type (HCC)  Plan -  Continue spiriva    Followup -3 months with Dr Desteny Freeman - 30 min visit   - symptom score and walk test at followup  - Call or return sooner if needed 

## 2022-11-23 NOTE — Progress Notes (Unsigned)
PCP Tara C, PA-[name]   HPI  IOV 02/10/2016  Chief Complaint  Patient presents with   Pulmonary Consult    Pt referred by Dr. Cyndia Bent for chronic cough x 1 year. Pt states she feels she has a tickle that is causing her dry cough. Pt states she has DOE when climbing stairs. Pt deneis CP/tightness.     82 year old female with hemochromatosis homozygous gene followed by Dr. Cyndie Chime (last phlebotomy many years ago and is on serial monitoring) with children and siblings with active disease. She has also atrial fibrillation followed by Dr. Graciela Husbands. Reports insidious onset of chronic cough in the last 1 year. It is stable since onset. It fluctuates between mild and severe in severity. It is mostly dry in quality. Mostly present in the daytime but sometimes also wakes her up at night. It is definitely not progressive. It is episodic and present every day. Aggravated by talking sometimes and then the throat feels dry associated with tickle in the throat and also sensation the cough is coming from the upper chest retrosternally and sometimes relieved by drinking water or chewing on a lozenge. Also aggravated by seasonal changes particularly in the spring and the fall which makes her think she has allergies. There is sometimes associated gag. She also reports nonspecific occasional wheezing and associated shortness of breath that is nonspecific but present with exertion and relieved by rest No other clear cut aggravating or relieving factors.   cough associated history  - Medications: She is not on fish oil or ACE inhibitors - Sinus drainage: She does admit to spring allergies. She did have something removed from her hard palate several years ago by ENT does not know details - Acid reflux: She says she is significant acid reflux and is on Prilosec. Without which she'll have significant symptoms - Pulmonary disease: FenO 12 02/10/2016  and normal. She denies any personal history of asthma or  pulmonary fibrosis of COPD or emphysema smoked one pack per day started smoking at age 51 and quit in 1999. Making a 42 pack smoking history. Chest x-ray 08/26/2014 personally visualized is clear - Tobacco :  reports that she quit smoking about 18 years ago. She has a 42.00 pack-year smoking history. She has never used smokeless tobacco.       OV 02/18/2016  Chief Complaint  Patient presents with   Follow-up    Pt here after PFT and HRCT. Pt denies changes in SOB and cough. Pt denies any new complaints at this time.    Follow-up chronic cough. This visit is to follow-up on test results of function test and high resolution CT chest which are described below9 results show isolated reduction in diffusion capacity which can be explained by possible early ILD and also emphysema. There are also new findings of nodule 6 mm bilaterally on lower lobes. She prefers a very cautious approach to this evaluation.     Pulmonary function test 02/12/2016 - FVC 2.8 cm/101%, FEV1 1.9 L/91%. Ratio 60/90%. Total lung capacity 115%. DLCO reduced at 14.5/60%. She is isolated reduction in diffusion capacity    HRCT chest 02/16/16 IMPRESSION: 1. Mild patchy subpleural reticulation in both lungs with a basilar predominance. No significant traction bronchiectasis. No frank honeycombing. Findings could represent an interstitial lung disease such as nonspecific interstitial pneumonia (NSIP), with early usual interstitial pneumonia (UIP) not excluded. A follow-up high-resolution chest CT study in 12 months is recommended to assess temporal pattern stability. 2. Bilateral lower lobe solid  pulmonary nodules, largest 6 mm. Non-contrast chest CT at 3-6 months is recommended. If the nodules are stable at time of repeat CT, then future CT at 18-24 months (from today's scan) is considered optional for low-risk patients, but is recommended for high-risk patients. This recommendation follows the consensus statement:  Guidelines for Management of Incidental Pulmonary Nodules Detected on CT Images:From the Fleischner Society 2017; published online before print (10.1148/radiol.[id number]). 3. Additional findings include mild-to-moderate centrilobular emphysema, aortic atherosclerosis and 2 vessel coronary atherosclerosis.     Electronically Signed   By: Delbert Phenix M.D.   On: 02/16/2016 14:14    OV 08/31/2016  Chief Complaint  Patient presents with   Follow-up    HRCT was never scheduled. Pt states that the cough is still present >> slightly improved since last OV. Pt states that she feels her allergies has gotten it ramped up again.    Follow-up multifactorial cough associated with autoimmune antibody positive, interstitial lung disease and emphysema previous history of smoking  After last visit in August 2017 she had high resolution CT chest in December 2017 that showed persistence of ILD. I personally visualized the CT chest. He comes for follow-up. She tells me that since August 2017 she has insidious onset of shortness of breath and is progressively worse. It is mild and present only on inclines is not therefore tenderness. In terms of her cough is persistent. It is associated with significant sinus drainage that she thinks is allergy related. She constantly clears her throat. Lab review shows autoimmune antibody positive in August 2017 with scleroderma antibody slightly elevated at 6.4. I referred her to rheumatology but she does not remember this and she did not make this follow-up. She also tells me that she definitely has a long history of [name] phenomena in her fingers but no one has ever formally diagnosed with connective tissu  e disease.  OV 11/28/2016  Chief Complaint  Patient presents with   Follow-up    Pt here after PFT. Pt states her breathing is uchanged since last OV. Pt [name]/o dry cough.- pt states this has slightly improved since last OV. Pt denies CP/tightness and f/[name]/s.      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 Since then she has seen rheumatologis Dr. Algis Downs. I reviewd the notes and also discussed with the patient wa a good understading of what s going on. She tells me that some of the lupus antibodies have been positive correlating with Raynaud. Patient is on beta blocker for atrial fbrillation and it is the preference by the rheumatologist Dr. D that she switc to calcium channel blocker. She has a follow-up appointment pending with her electrophysiologist Dr. Graciela Husbands.  She is scleroderma antibody positive but according to her rheumtolgist she does not have dermatologic  Manifestation of the disese. 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 She pulm  function test today and this is stable. In terms of arrest or symptoms she stable. The cough is actually improved. But she does get dyspneic walking up stairs especially a few flights. She does not wnt her to pulmonary habilitation because se exrcises on the treadmill although she does not monitor her saturations.   reports that she quit smoking about 19 years ago. She has a 42.00 pack-year smoking history. she has never used smokeless tobacco.   OV 06/12/2017  Chief Complaint  Patient presents with   Follow-up    Pt states that she has been doing good since last visit. States that she has a "tickle cough" that is sporadic and has SOB that she states is when she climbs 2 flights of stairs. Denies any CP.    Follow-up combined emphysema was interstitial lung disease [autoimmune undifferentiated connective tissue disease interstitial lung disease]. Autoimmune features from May 2018 rheumatology noted with Dr. Algis Downs: Raynaud phenomenon positive without gangrene, positive anti-cardiolipin and positive IgM and SCL-70 positive without clinical features of  scleroderma    Last seen June 2018. Since then she's stable. Overall she tells me that only problem is a tickle in her throat and slight cough. This is up and down depending on the pollen exposure she gets. She gets dyspneic for climbing few to several flights of stairs. This is unchanged. She did have full function test today and that shows mild significant worsening though overall gradient is only mild.. However she's not feeling this. She has appointment with Dr. Algis Downs  pending    OV 07/18/2017  Chief Complaint  Patient presents with   Follow-up    HRCT done 07/03/17.  Pt states she is the same as she was at last visit. SOB with exertion.    Follow-up combined emphysema was interstitial lung disease [autoimmune undifferentiated connective tissue disease interstitial lung disease]. Autoimmune features from May 2018 rheumatology noted with Dr. Algis Downs: Raynaud phenomenon positive without gangrene, positive anti-cardiolipin and positive IgM and SCL-70 positive without clinical features of scleroderma    [name] presents for follow-up.  She is here to discuss the results of a high-resolution CT scan of the chest.  The scan was reviewed by Dr. Fredirick Lathe thoracic radiology who feels that patient has probable UIP pattern that is definitely progressive compared to August 2017 although there is no comment about progression since June 2018 CT scan.  The pulmonary nodules itself are stable since August 2017 and are likely benign.  The change in the CT scan which is mild progression pulmonary fibrosis corresponds with the pulmonary function test that shows mild progression.  She is now here with her daughter Denny Peon and her husband.  She also states that 1 of her other daughters has rheumatoid arthritis and is on TNF alpha blockade.  All her 3 daughters have hemo-chromatosis    OV 08/29/2017  Chief Complaint  Patient presents with   Follow-up    Pt states she has been doing good since last visit. Pt  recently went to [place] and had some mild problems with SOB due to heat.  Pt was prescribed cellcept and bactrim but has not yet started meds due to questions with other meds she is taking.    82 year old female with ILD secondary to autoimmune disease clinically suspicious of scleroderma but also previous history of hemochromatosis with family history of hemochromatosis.  She is here with her husband.  This visit is only a discussion visit.  Because she has many questions about starting CellCept before she actually does.  We did hepatitis virus panel, QuantiFERON gold, G6PD and all this is normal.  Lab work is normal.  I checked with [facility] pharmacist about interactions and was cleared for her to start CellCept.  The only recommendation was for patient to come off Prilosec because of reduced effect of CellCept.  Patient questions revolved around CellCept: She does not want to come off PPI because of hiatal hernia and has had bad acid  reflux.  So I would not [place] Hun asking for any alternative PPI.  In addition she states she takes multiple multivitamins including Biotene, vitamin D3, vitamin D, vitamin B12 and folic acid.  She also takes Allegra occasionally.  She wants to make sure it is all okay with CellCept.  She has not had a flu shot today and she is asking if she should have it.  She has never had flu shot before.  She is also wondering about anticoagulation in the setting of CellCept and thyroid issues in the setting of Bactrim.  In terms of her genetics: Initially I corresponded with our local geneticist who thought she might be better served at [facility] but the [facility] genetics person wrote to me saying that patient should first be seen by [facility] rheumatology and hematology.  Patient's not so sure she wants to see the subspecialist.  She will speak to Dr. Cyndie Chime her local hematologist to see if it is worthwhile seeing the hematology department at [facility].   Lungs/Pleura: Moderate centrilobular emphysema. Mild basilar predominant subpleural reticulation and ground-glass, increased from 02/16/2016. No traction bronchiectasis/ bronchiolectasis, architectural distortion or honeycombing. Side-by-side nodules in the medial left lower lobe measure up to 6 mm (series 3, image 95), unchanged from 02/16/2016 and considered benign. No air trapping. No pleural fluid. Airway is unremarkable.  IMPRESSION: 1. Mild progression in basilar predominant subpleural fibrosis from 02/16/2016, raising suspicion for usual interstitial pneumonitis. 2. Aortic atherosclerosis (ICD10-170.0). Moderate coronary artery calcification. 3.  Emphysema (ICD10-J43.9).     Electronically Signed   By: Leanna Battles M.D.   On: 07/03/2017 09:54    OV 09/19/2017  Chief Complaint  Patient presents with   Follow-up    Pt states she has been doing good. Currently on cellcept and bactrim and has been doing well on it once put back on omeprazole. Pt still has the cough and sometimes when trying to take a deep breath can't fully get all O2 out.   [name] returns for follow-up.  This is for autoimmune interstitial lung disease.  She was started on CellCept recently.  She is here to follow-up for therapeutic drug monitoring.  Because she was on omeprazole and we were initiating CellCept with Bactrim we asked her to hold off on omeprazole because the omeprazole impairs drug levels of CellCept.  However she tried it for a week and her acid reflux was significant despite ranitidine so she went back on omeprazole.  Pharmacist [place] Hun is advising changing the CellCept to equivaekbtdose of Myfortic. So far tolerating cellcept/pred/bactrimm well. Has seen dR G in heme and on observation. Had PFT today on cellcept/pred/bactrim and is better! Continue siwht spriva  OV 10/31/2017  . Chief Complaint  Patient presents with   Follow-up    Pt currently taking  myfortic and states she has been well on that. Pt states she has some mild nausea and has some problems with sleeping at night. SOB is stable,mild coughing. Denies any CP.    Follow-up progressive interstitial lung disease with SCL-70 antibody positive and Raynard Follow-up associated emphysema.  Last visit September 19, 2017.  At that time she had intolerance to the CellCept therefore we switched her to Myfortic.  She is here to report for follow-up about this.  She is tolerating Myfortic just well.  She had some transient insomnia for a few days last week but this resolved.  She only has mild intermittent occasional nausea with the Myfortic but otherwise is  tolerating it really well at the low-dose of 360 mg twice daily.  She continues on prednisone 10 mg daily and Bactrim 3 times a week.  She is due for blood work today.  There are no other new issues.  She continues on Spiriva for her emphysema   OV 02/01/2018  Chief Complaint  Patient presents with   Follow-up    PFT performed today.  Pt stated other than having shingles earlier, things have been doing good for her. Pt states SOB is stable and states she still has an occ cough.    Follow-up progressive interstitial lung disease with SCL-70 antibody positive and Raynud - IPAF Follow-up associated emphysema   [name] presents for routine follow-up with interstitial lung disease clinic. She has the above problems. In the interim she tells me she had developed shingles on the right neck but this is resolved. This is despite having zoster vaccine in 2012. She was yet to have the bnnew  shingles vaccine.in any event currently she is feeling well. She only has occasional post residual neuropathic pain. In terms of her shortness of breath it is stable and minimal. Walking desaturation test shows stability. Pulmicort function test below show stability. She is tolerating the mycophenolate myfortic well at 360 mg twice daily and prednisone 10 mg  daily and Bactrim for PCP prophylaxis. Overall she feels stable and feels that it is best medicines on touch in terms of dosing change  OV 08/14/2018  Subjective:  Patient ID: [name], female , DOB: 1941-04-03 , age 73 y.o. , MRN: [id number] , ADDRESS: [address] [place] [address]   08/14/2018 -   Chief Complaint  Patient presents with   Follow-up    PFT performed today.  Pt states she is slowly getting better after her recent pna. States she still has some issues with SOB and an occ cough. Pt also states that she feels like she has a weight on her chest at times.    Follow-up progressive interstitial lung disease with SCL-70 antibody positive and Raynud - IPAF - on myfortic since march 2019 with pred 10 and bactrim  Follow-up associated emphysema - on spiriva    HPIfortic [name] 82 y.o. -returns for 15-month follow-up of her interstitial lung disease associated with emphysema.  She presents with her middle daughter Denny Peon who is met me before.  She continues on Myfortic and prednisone 10 mg and Bactrim prophylaxis.  She continues on Spiriva.  She had an episode of atrial fibrillation according to review of the chart and my on history with in the middle of January 2020.  At this time she had a chest x-ray that showed asymptomatic upper lobe pneumonia.  I personally visualized the chest x-ray and confirmed the findings.  Antibiotic was not prescribed.  Then I follow this up with a CT scan of the chest [we canceled her elective colonoscopy].  CT chest showed improved pneumonia findings.  I prescribed even though she was feeling well cephalexin for a week.  She says she took it.  She continues to feel well except she has baseline amount of symptoms that is documented below.  Of note the high-resolution CT chest showed an improvement in ILD compared to a year ago.  In fact her pulmonary function tests are also showing stability/improvement as documented below.  She is very  happy about this outcome with immunosuppression .  However, she is a little bit unsure why she is symptomatic in  terms of shortness of breath and cough.  She is very concerned that her fibrosis is getting worse.  She wants to have closer follow-up than every 6 months.   IMPRESSION: HRCT Jan 2020 1. Vaguely bandlike patchy consolidation and ground-glass opacity at the periphery of the right upper and superior segment right lower lobes, new since 07/03/2017 chest CT, improving compared to 07/10/2018 chest radiograph, favoring resolving pneumonia with evolving postinfectious scarring. 2. Previously described basilar predominant subpleural reticulation and ground-glass attenuation is minimal and appears decreased in the interval. No bronchiectasis or honeycombing. Findings may represent an interstitial lung disease such as nonspecific interstitial pneumonia (NSIP) or less likely early usual interstitial pneumonia (UIP). Findings are indeterminate for UIP per consensus guidelines: Diagnosis of Idiopathic Pulmonary Fibrosis: An Official ATS/ERS/JRS/ALAT Clinical Practice Guideline. Am Rosezetta Schlatter Crit Care Med Vol 198, Iss 5, [phone], Feb 25 2017. 3. Moderate centrilobular emphysema with mild diffuse bronchial wall thickening, suggesting COPD. 4. Two-vessel coronary atherosclerosis. 5. Small hiatal hernia.   Aortic Atherosclerosis (ICD10-I70.0) and Emphysema (ICD10-J43.9). ROS - per HPI    OV 04/22/2019  Subjective:  Patient ID: [name], female , DOB: 27-Jan-1941 , age 15 y.o. , MRN: [id number] , ADDRESS: [address] [place] [address]   Follow-up slowly progressive interstitial lung disease with SCL-70 antibody positive and Raynud - IPAF - on myfortic since march 2019 with pred 5 and bactrim 3x/week  Follow-up associated emphysema - on spiriva   04/22/2019 -   Chief Complaint  Patient presents with   Follow-up    Patient reports that her breathing is doing well  at this time.      HPI [name] BOAK 82 y.o. -presents for follow-up.  Last seen by myself in early part of 2020 before the pandemic.  At that time CT chest suggested possible progression.  She saw a nurse practitioner in the interim after the onset of the pandemic.  She was anxious about the progression of her ILD so she asked for a CT scan in less than 1 year.  A CT scan of the chest was done in September 2020.  This is deemed stable since early part of the year but with possible progression versus stability since January 2019.  In terms of her symptoms she continues to be stable with very minimal shortness of breath and cough.  Her pulmonary function test below also shows stability.  Her walking desaturation test also shows stability.  She is tolerating her Myfortic, prednisone and 3 times a week of Bactrim just fine.  There are no side effects.  Last blood work was in August 2020 and this was reviewed.  We discussed the possibility of switching to antifibrotic nintedanib instead of taking immunosuppressants.  The rational for this would be it is a safer drug with less long-term side effect such as lack of immunosuppression.  However she feels her current regimen is working well for her.  She also feels that she would benefit more from a immunosuppressive drug because of her autoimmune antibodies and therefore she is more inclined to continue with her current immunosuppressive regimen of prednisone 5 mg/day and Myfortic and Bactrim 3 times a week.        OV 07/04/2019  Subjective:  Patient ID: [name], female , DOB: October 11, 1940 , age 38 y.o. , MRN: [id number] , ADDRESS: [address] [place] [address]   Follow-up mildy progressive - stable interstitial lung disease with SCL-70 antibody positive and Raynud -  IPAF - on myfortic since march 2019 with pred 5 and bactrim 3x/week  Follow-up associated emphysema  Follow-up Raynard  Has associated hemochromatosis  Has  associated atrial fibrillation on Xarelto   07/04/2019 -   Chief Complaint  Patient presents with   Follow-up    Pt states she has been doing well since last visit and denies any complaints.     HPI [name] 82 y.o. -last visit was in October 2020.  At that time she was doing well.  Given her continued immunosuppression with prednisone and Myfortic we discussed the possibility of switching the Myfortic to the nintedanib but understanding that there uncertainties whether this is superior approach and if she could end up with temporary side effects of the nintedanib.  Based on that she decided to continue with prednisone, Myfortic and Bactrim as she was doing because things are working well.  However she later called in and wanted this change to nintedanib.  We did this change to nintedanib but then her co-pay ended up being over 500+ dollars and then she started also getting worried about the side effects with nintedanib particularly GI.  She is also on Xarelto for atrial fibrillation.  Therefore she made this visit ahead of schedule to discuss the possibility of making the switch.  I understand from her that it will be 500+ dollars every month with nintedanib as a co-pay.  In addition she is worried about the superiority of taking nintedanib.  She fully understands that Myfortic over length of time can carry cancer risk and immunosuppression risk but at this point in time she feels well and she is tolerating it well.  She also understands that nintedanib can carry temporary GI side effects.  She also understands that it may not be a superior approach.  She understands it could be an equivalent approach and definitely lower immunosuppression.  Her main systemic immunosuppressive features are lung disease and Raynaud.  She is worried this might flareup.  In terms of her symptoms there is stable as documented below.  Her walking desaturation test is also stable.   Last pulmonary function test  was in August r 2020 Last CT scan of the chest was in September 2020 Last blood work October 2020 Last echocardiogram 2012.  ROS - per HPI  OV 10/22/2019  Subjective:  Patient ID: [name], female , DOB: May 05, 1941 , age 48 y.o. , MRN: [id number] , ADDRESS: [address] [place] [address]   Follow-up mildy progressive - stable interstitial lung disease with SCL-70 antibody positive and Raynud - IPAF - on myfortic since march 2019 with pred 5 and bactrim 3x/week  Follow-up associated emphysema  Follow-up Raynaud  Has associated hemochromatosis  Has associated atrial fibrillation on Xarelto    10/22/2019 -   Chief Complaint  Patient presents with   Follow-up    PFT performed today. Pt states she has been doing good since last visit. Denies any complaints with breathing.     HPI [name] 82 y.o. -returns for routine follow-up.  Overall doing well.  She feels shortness of breath is stable.  She feels she is tolerating Myfortic and prednisone fine no new interim issues she has had a Covid vaccine.  She continues on high risk immunosuppressive therapy.  There is some mild diarrhea with the Myfortic but it is tolerable.  The shortness of breath scale is stable walking desaturation test is stable.  Her pulmonary function test shows stable FVC with  a possible reduction in diffusion capacity.  I do not know what to make of this at this point in time.  It could be a technical issue because she is feeling stable.  She did have echocardiogram that showed grade 2 diastolic dysfunction.   She continues on her Spiriva for associated emphysema.  ROS - per HPI     OV 03/03/2020   Subjective:  Patient ID: [name], female , DOB: 24-Sep-1940, age 16 y.o. years. , MRN: [id number],  ADDRESS: [address] 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 -returns for follow-up.  Overall she is doing well.  However in the last 1-2 months she says she is slightly more short of breath than usual.  She is also having slight increase in dry cough.  Based on her subjective symptom questionnaires it seems the cough itself is stable but she is definitely saying it is worse.  She is seeing the dyspnea is overall stable compared to the cough but looking at the symptom questionnaire is maybe it is a little worse.  Is hard to discern.  It has been approximately 1 year since she had a high-resolution CT chest.  She is wondering if she can have this repeated.  She is on immunosuppression.  She is also noticing -that at home her pulse oximetry on her finger varies a lot [she does have [name]]) and sometimes can be as low as 88% - 93%.  She does acknowledge that the circulation in her hands is not as good.  Pulse ox here at rest was normal.  She had pulmonary function test that shows stability.-With the FVC although I am not sure what to make of the DLCO.  She has had a Covid booster vaccine  Last echocardiogram January 2021 with mild diastolic dysfunction    IMPRESSION: 1. Very mild basilar predominant subpleural reticulation and ground-glass, similar to minimally progressive from 07/03/2017. Nonspecific interstitial pneumonitis is favored. Findings are indeterminate for UIP per consensus guidelines: Diagnosis of Idiopathic Pulmonary Fibrosis: An Official ATS/ERS/JRS/ALAT Clinical Practice Guideline. Am Rosezetta Schlatter Crit Care Med Vol 198, Iss 5, [id number], Feb 25 2017. 2. Aortic atherosclerosis (ICD10-170.0). Coronary artery calcification. 3. Enlarged pulmonic trunk, indicative of pulmonary arterial hypertension. 4.  Emphysema (ICD10-J43.9).     Electronically Signed   By: Leanna Battles M.D.   On: 03/20/2019 14:47  Echocardiogram -January 2021: Shows grade 2 diastolic dysfunction but otherwise unremarkable.   ROS - per HPI  HPI [name] 82 y.o. -last visit was in September 2021.  She continues to do well overall.  Shortness of breath symptom question is stable.  Walking desaturation test is stable.  She is compliant with her medications.  In September 2021 there is concern that her ILD was getting worse because of change in PFT.  So we did a CT scan of the chest radiologist reported this to be stable since 2017.  Most recently she  had blood work in December 2021 chemistry panel is normal and I reviewed it.  CBC panel is also normal with stable mild anemia hemoglobin 12.4 g%.  She is up-to-date with her COVID vaccine including booster.  She had pulmonary function test today that showed some fluctuation.  Her main concern is that she is coughing more  In terms of her cough it is dry and has a barking quality to it.  Or laryngeal quality to it.  It is mostly at day and night but never wakes her up at night.  When she has it at night it is only when she wakes up.  Talking makes it worse.  Laughing makes it worse.  Drinking water makes it better.  She thinks it is progressive.  Symptom score showed the cough is getting worse and deep.  She has no fever or chills.  No desaturations.  We discussed the possibility of opportunistic infection but she rightfully questant if it could be opportunistic infection if everything else is stable.  There is no wheezing.  She is wondering if it could be because of allergies.  She has seasonal allergies.  Review of the records indicate this has not been  investigated.  She is willing to have an investigation for this.  We discussed the possibility of cough neuropathy.  She recollects has had gabapentin before when she had shingles.  She did not have any side effects from it.     CT Chest data sept 2021  TECHNIQUE: Multidetector CT imaging of the chest was performed following the standard protocol without intravenous contrast. High resolution imaging of the lungs, as well as inspiratory and expiratory imaging, was performed.   COMPARISON:  09/09/2019, 03/20/2019, 07/03/2017, 12/06/2016, 06/15/2016   FINDINGS: Cardiovascular: Aortic atherosclerosis. Normal heart size. Three-vessel coronary artery calcifications. No pericardial effusion.   Mediastinum/Nodes: No enlarged mediastinal, hilar, or axillary lymph nodes. Thyroid gland, trachea, and esophagus demonstrate no significant findings.   Lungs/Pleura: Moderate centrilobular emphysema. No significant interval change in subtle irregular peripheral interstitial opacity predominantly seen in the bilateral lung bases, although also noted in nondependent portions of the right middle lobe and lingula. Mild lobular air trapping on expiratory phase imaging. Stable, definitively benign and faintly rim calcified 6 mm nodule of the dependent left lower lobe (series 5, image 86). No pleural effusion or pneumothorax.   Upper Abdomen: No acute abnormality.   Musculoskeletal: No chest wall mass or suspicious bone lesions identified.   IMPRESSION: 1. No significant interval change in subtle irregular peripheral interstitial opacity predominantly seen in the bilateral lung bases, although also noted in nondependent portions of the right middle lobe and lingula. There is no appreciable change in these findings on multiple prior examinations dating back to 2017. Bland post infectious or inflammatory scarring is favored, although minimal fibrotic interstitial lung disease is not excluded and  if characterized by ATS pulmonary fibrosis criteria, findings are consistent with an early "indeterminate for UIP" pattern. Consider ongoing follow-up CT to assess for stability of fibrotic findings. Findings are indeterminate for UIP per consensus guidelines: Diagnosis of Idiopathic Pulmonary Fibrosis: An Official ATS/ERS/JRS/ALAT Clinical Practice Guideline. Am Rosezetta Schlatter Crit Care Med Vol 198, Iss 5, [phone], Feb 25 2017. 2. Emphysema (ICD10-J43.9). 3. Coronary artery disease.  Aortic Atherosclerosis (ICD10-I70.0).     Electronically Signed   By: Lauralyn Primes M.D.   On: 03/16/2020 14:12  OV 02/23/2021  Subjective:  Patient ID: [name], female , DOB: 09/09/1940 , age 7 y.o. ,  MRN: [id number] , ADDRESS: [address] [place] [address] PCP Sigmund Hazel, MD Patient Care Team: Sigmund Hazel, MD as PCP - General (Family Medicine) Duke Salvia, MD as PCP - Cardiology (Cardiology) Levert Feinstein, MD as Consulting Physician (Hematology) Duke Salvia, MD as Consulting Physician (Cardiology) Sharrell Ku, MD as Consulting Physician (Gastroenterology)  This Provider for this visit: Treatment Team:  Attending Provider: Kalman Shan, MD    02/23/2021 -   Chief Complaint  Patient presents with   Follow-up    Pt states she has been doing okay since last visit and denies any complaints.    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  Follow-up associated emphysema  Follow-up Raynaud  Has associated hemochromatosis  Has associated atrial fibrillation on Xarelto  Mild associated diastolic dysfunction and echo January 2021  HPI KILY HOPF 82 y.o. -returns for her 63-month follow-up.  Last visit with me was in February 2022.  Is   She continues on Myfortic prednisone and Bactrim.  We  held off on nintedanib given stability.  She did have spirometry in February 2022.?  There is a slight decline but she feels stable.  Symptom scores are stable.  Walking desaturation test is below and is stable versus a three-point drop that is?  Significant..  Last CT scan September 2021.  But clinically she feels stable.  We held off on nintedanib last visit because of stability.       OV 07/23/2021  Subjective:  Patient ID: [name], female , DOB: Jul 16, 1940 , age 65 y.o. , MRN: [id number] , ADDRESS: [address] [place] [address] PCP Sigmund Hazel, MD Patient Care Team: Sigmund Hazel, MD as PCP - General (Family Medicine) Duke Salvia, MD as PCP - Cardiology (Cardiology) Levert Feinstein, MD as Consulting Physician (Hematology) Duke Salvia, MD as Consulting Physician (Cardiology) Sharrell Ku, MD as Consulting Physician (Gastroenterology)  This Provider for this visit: Treatment Team:  Attending Provider: Kalman Shan, MD    07/23/2021 -   Chief Complaint  Patient presents with   Follow-up    Pt states she has been doing okay and denies any complaints.    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  Follow-up associated emphysema  Follow-up Raynaud  Has associated hemochromatosis  Has associated atrial fibrillation on Xarelto  Mild associated diastolic dysfunction and echo January 2021  HPI [name] 82 y.o. -returns for 1-month follow-up.  Last seen in August 2022.  She feels since then dyspnea slightly worse.  This is reflected in the symptom score below.  In December 2022 he did have COVID and she recovered from it.  She does not think the decline is because of the COVID.  She continues on her prednisone Bactrim and Myfortic.  Her last CT scan of the chest was in  September 2021.  Last echocardiogram was in January 2021.  Last pulmonary function test was almost 1 year ago in February 2022.  She is wondering about repeat investigations.  She is concerned about worsening ILD.    Of note she just turned 82 years old.  OV 09/09/2021  Subjective:  Patient ID: Tara Regal  Annamary [name], female , DOB: 11-Mar-1941 , age 21 y.o. , MRN: [id number] , ADDRESS: [address] [place] [address] PCP Sigmund Hazel, MD Patient Care Team: Sigmund Hazel, MD as PCP - General (Family Medicine) Duke Salvia, MD as PCP - Cardiology (Cardiology) Levert Feinstein, MD as Consulting Physician (Hematology) Duke Salvia, MD as Consulting Physician (Cardiology) Sharrell Ku, MD as Consulting Physician (Gastroenterology)  This Provider for this visit: Treatment Team:  Attending Provider: Kalman Shan, MD    09/09/2021 -   Chief Complaint  Patient presents with   Follow-up    PFT performed today. Pt states she has been doing okay and denies any complaints.    HPI [name] 82 y.o. -returns for follow-up.  At this point in time she is feeling stable.  She had work-up to see if her ILD is worse or she has developed pulmonary hypertension.  CT scan shows stability since 2021.  Her pulmonary function test shows a 3% decline in DLCO.  On an average she has 3% decline per year over the last 4 years.  At this point in time she is feeling well.  No side effects from Myfortic.  She asked about options if she were to get worse.  We discussed about nintedanib.  Also indicated there is an injection [tocilizumab] but at this point in time she is fine with the choices.  She does understand Myfortic is immunosuppressant and there is also cancer risk.  She is decided to continue.  She also has mild emphysema for which she is on Spiriva and it is stable.  I gave her a book called Thursdays with Dennard Nip written by the patient who has pulmonary fibrosis and scleroderma.  She  is going to read it.  We did discuss briefly clinical trials as a care option and she is interested  -No clearing of the throat.  -She has seen rheumatology recently.  She continues to have [name].  They have a nitroglycerin.  She does not want to try it.  We discussed about using heart warmers.  She is going to buy this   CT Chest data  XR [name] NO REPORT  Result Date: 09/07/2021 Please see Notes tab for imaging impression.      OV 04/21/2022  Subjective:  Patient ID: [name], female , DOB: Jul 21, 1940 , age 33 y.o. , MRN: [id number] , ADDRESS: [address] [place] [address] PCP Sigmund Hazel, MD Patient Care Team: Sigmund Hazel, MD as PCP - General (Family Medicine) Duke Salvia, MD as PCP - Cardiology (Cardiology) Levert Feinstein, MD as Consulting Physician (Hematology) Duke Salvia, MD as Consulting Physician (Cardiology) Sharrell Ku, MD as Consulting Physician (Gastroenterology)  This Provider for this visit: Treatment Team:  Attending Provider: Kalman Shan, MD  04/21/2022 -   Chief Complaint  Patient presents with   Follow-up    6 Follow-up PT states no changes, DOE (upstairs, fast paced walking)     HPI [name] 82 y.o. -returns for follow-up.  She tells me that since last seeing me since the summer or so she is slightly more short of breath especially when she climbs stairs but overall symptom score is stable.  In September 2023 she went on vacation to [facility] and came back with her girlfriends and she had a great time but she did notice more shortness of breath than usual.  She attributed this to heavier exertion but nevertheless with stairs she is  finding herself more short of breath.  She is compliant with Spiriva for associated emphysema Myfortic Bactrim and daily prednisone.  2 days ago she saw Sherron Ales rheumatology PA for her scleroderma and has been deemed stable  Pulmonary function test shows  slight reduction in FVC over time but still within normal limits but the DLCO is dramatically low.  There is no anemia correction on the DLCO.  When I did a rough anemia correction it appears that DLCO might be okay.  Of asked for a formal correction on the DLCO for anemia.  Given her anemia which appears to be relatively more pronounced compared to 05-03-23greater than 1.5 g% drop she has been referred by rheumatology to hematology because of history of hemochromatosis.  Her last echo and CT scan of the chest were earlier this year  We discussed about the 3 different respiratory vaccines.  Strongly recommended COVID mRNA because she is immunocompromised.  She did have outpatient COVID December 2022 but it was a different strain.  Also recommend high-dose flu shot which she will get today and we discussed RSV vaccine.     PFT  OV 05/26/2022  Subjective:  Patient ID: [name], female , DOB: 1940/11/14 , age 57 y.o. , MRN: [id number] , ADDRESS: [address] [place] [address] PCP Sigmund Hazel, MD Patient Care Team: Sigmund Hazel, MD as PCP - General (Family Medicine) Duke Salvia, MD as PCP - Cardiology (Cardiology) Duke Salvia, MD as Consulting Physician (Cardiology) Sharrell Ku, MD as Consulting Physician (Gastroenterology) Jaci Standard, MD as Consulting Physician (Hematology and Oncology)  This Provider for this visit: Treatment Team:  Attending Provider: Kalman Shan, MD    05/26/2022 -   Chief Complaint  Patient presents with   Acute Visit    Pt started having complaints of dry cough, wheezing, and chest discomfort which began 1 week ago. Has done multiple covid tests which all have come back negative. States she is now coughing up phlegm and also has complaints of a headache and has no energy.   HPI [name] 82 y.o. -she is making an acute visit.  She presents with her daughter Denny Peon who I have met before.  She was on a  [place] cruise out of [place] between May 14, 2022 and Saturday after Thanksgiving May 20, 2022.  On Friday after Thanksgiving she suddenly took ill.  Symptoms got worse on Saturday when she returned.  She describes laryngitis ear pain cough headaches sore throat runny nose brain fog increased fatigue and sleeping.  COVID test x 2 was negative.  She called our office and we gave her antibiotic and prednisone because the sputum is now yellow.  She is taking this but she wanted an acute visit today.  Some symptoms are better.  She is saying that her headache is better sore throat is better congestion is better and the tiredness is better but according to the daughter she still sleeping a lot.  Her cough nature has changed and she is got some yellow sputum now.  So some symptoms are not better.  Walking desaturation test is stable but she only walked 2 laps and she stopped because of fatigue.  The cough is particularly worse at night.  She does not want any cough medication  OV 07/08/2022  Subjective:  Patient ID: [name], female , DOB: 01/04/1941 , age 47 y.o. , MRN: [id number] , ADDRESS: [address] [place]  [place] [address] PCP Sigmund Hazel, MD Patient Care Team: Sigmund Hazel, MD as PCP - General (Family Medicine) Duke Salvia, MD as PCP - Cardiology (Cardiology) Duke Salvia, MD as Consulting Physician (Cardiology) Sharrell Ku, MD as Consulting Physician (Gastroenterology) Jaci Standard, MD as Consulting Physician (Hematology and Oncology)  This Provider for this visit: Treatment Team:  Attending Provider: Kalman Shan, MD     07/08/2022 -   Chief Complaint  Patient presents with   Follow-up    Spiro/dlco done today. She states had a hospitalization 06/20/22 with compression fx. Her cough is about the same- occ prod and has to clear throat but does not cough out the sputum. Breathing is at baseline.      HPI [name] 82 y.o.  -returns for follow-up.  I saw her just after Thanksgiving 2023 when she had come back from a cruise and was having respiratory symptoms of acute bronchitis.  She at that time was through her doxycycline and prednisone taper she had significant cough.  It appears that after that visit her cough continued unabated.  She was coughing a lot at night.  Then she ended up in December 2023 according to history and review of the external records in the hospital with L2 spinal compression fracture.  She believes the cough caused it.  I did explain to her that I have clinically not seen lumbar spinal compression fracture from cough [I subsequently discussed with Dr. Sandrea Hughs about cough expert and he has seen a few patients with lumbar spine compression fracture with severe cough].  In the hospital hemoglobin was 12.3 g%.  And a creatinine was 0.8 mg percent.  She is now back home she is improving.  She is status post kyphoplasty 06/30/2022.  She still has a cough she says she is 80% better compared to when she saw me in November 2023.  She still has some nighttime cough.  She is willing to try some Tessalon Perles all she is tried so far is Delsym cough syrup.  She continues on her chronic immunosuppressive medications.  At the last visit I ordered an echocardiogram: This is currently pending and is going to be done soon  At the last visit I ordered high-resolution CT chest but in the hospital she did have a CT angiogram: Mild ILD is reported although this is a contrasted CT.  This was done on 06/20/2022.  I personally visualized and independently agree with the findings  She did a pulmonary function test today: This is stable.     OV 11/24/2022  Subjective:  Patient ID: [name], female , DOB: 1940-10-30 , age 43 y.o. , MRN: [id number] , ADDRESS: [address] [place] [address] PCP Sigmund Hazel, MD Patient Care Team: Sigmund Hazel, MD as PCP - General (Family Medicine) Duke Salvia, MD as PCP - Cardiology (Cardiology) Duke Salvia, MD as Consulting Physician (Cardiology) Sharrell Ku, MD as Consulting Physician (Gastroenterology) Jaci Standard, MD as Consulting Physician (Hematology and Oncology)  This Provider for this visit: Treatment Team:  Attending Provider: Kalman Shan, MD   Follow-up mildy sllowy progressive - stable interstitial lung disease with SCL-70 antibody positive, atnticardiolipnd abd + and Raynud - IPAF - on myfortic since march 2019 with pred 5 and bactrim 3x/week  -Last CT feb 2023 without progerssion.  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  Has associated hemochromatosis  Has associated atrial fibrillation on Xarelto  Mild associated diastolic dysfunction in pst but not on echo  Feb 2023  LBP with L2 compression frcture status post kyphoplasty 1//24.- dec 2023 after viral inection during cruise nov 2023  11/24/2022 -   Chief Complaint  Patient presents with   Follow-up    Annual f/up     HPI MORRIS LEITAO 82 y.o. -returns for follow-up.  She says her shortness of breath itself is stable.  Exercise size sit/stand desaturation test today stable.  No new medical problems.  No emergency room visits.  However on memorial day weekend she did go to the emergency room and had an infection of the right small toe.  She is on doxycycline.  Other than that she is well.  But the main issue is that her chronic severe cough persist.  This cough got worse in November 2023/January 2024 after a cruise and viral infection and spinal fracture.  This continues unabated.  Wakes her up at night.  As a tickling of the chest there is no wheezing there is no fever there is a dry cough.  Review of the labs indicate she has never had allergy panel test.  Her eosinophils are slightly elevated.  Her last CT scan of the chest was in December 2023  but was an angiogram.  Showed mild ILD.   feNO could NOT do   SYMPTOM SCALE - ILD 08/14/2018  04/22/2019  07/04/2019  10/22/2019  03/03/2020 139# 08/18/2020  02/23/2021  07/23/2021 Covid dec 2022 04/21/2022  07/08/2022 Post rsv 11/24/2022   O2 use *RA            Shortness of Breath 0 -> 5 scale with 5 being worst (score 6 If unable to do)            At rest 1 0 0 0.5 1 0 0.5 1 0 1 0  Simple tasks - showers, clothes change, eating, shaving 1.5 0 .5 0.5 1 1  0.5 1.5 0 1 1  Household (dishes, doing bed, laundry) 1.5 1 0.5 0.5 1 1  0.5 1.5 1 2  1.5  Shopping 1.5 0 0/5 0.5 1 1 1 1  0 1.5 0  Walking keeping up with others of same age 39 0 0.5 0.5 1.5 1.5 1 1  0 1.5 0  Walking up Stairs 2.5 1 0.5 0/5 1.5 1.5 1.5 2 1 3  1.5  Total (40 - 48) Dyspnea Score 10- 2 2.5 3 7 6 5 8 2 10 4   How bad is your cough? 1.5 1.5 0.5 1 1.5 3 1.5 2 1  3.5 4  How bad is your fatigue 1.5 1 1 2  1.5 2 1.5 2 2  2.5 2  nausea    0 0 0.5 0 0 0 0 0  vomit    0 0 0 0 0 0 0 0  dairrhea    2.5 0 0.5 1.5 3 1 1  3.5  anxiety    0 0 0 0 0 0 0 0  deoresoj    0 0 0 0 0 0 0 0    Simple office walk 185 feet x  3 laps goal with forehead probe 07/04/2019  10/22/2019  03/03/2020 Finger probe 02/23/2021  04/21/2022  05/26/2022 Acute visit 11/24/2022   O2 used ra ra ra  ra ra ra ra  Number laps completed 3 3 3    Stopped at 2  Sit stand x 10  Comments about pace avg  avg  Mod seteady pace avg   Resting Pulse Ox/HR 98% and 74/min 100% and 66 96% and 80 99% and 69 99% and HR 65 100% and HR 71 94% and HR 62  Final Pulse Ox/HR 98% and 87/min 97% and 81 95% and 83 96% and 78 96% and HR 82 97% and HR 86 95% and HR 60  Desaturated </= 88% no no no  no    Desaturated <= 3% points no no no yes Yes 3 pnts    Got Tachycardic >/= 90/min no no no  no    Symptoms at end of test Mild dyspnea Very mild dyspnea no no none Fatigued ad mild dyspnea   Miscellaneous comments x    stable Stopped at 2 of goal 3     PFT     Latest Ref Rng & Units 07/08/2022     2:57 PM 04/12/2022   10:27 AM 09/09/2021   10:46 AM 08/18/2020    2:49 PM 02/28/2020    4:16 PM 10/22/2019   11:28 AM 02/18/2019    2:10 PM  PFT Results  FVC-Pre L 2.52  2.48  2.67  2.78  2.90  2.83  2.82   FVC-Predicted Pre % 99  96  103  105  108  106  104   Pre FEV1/FVC % % 73  72  68  68  66  69  67   FEV1-Pre L 1.84  1.80  1.81  1.88  1.92  1.96  1.89   FEV1-Predicted Pre % 97  93  94  96  96  98  93   DLCO uncorrected ml/min/mmHg 11.51  9.26  11.33  11.74  11.88  12.46  15.31   DLCO UNC% % 61  49  60  62  63  66  81   DLCO corrected ml/min/mmHg 11.93  9.26  12.46  11.74  11.88  12.46    DLCO COR %Predicted % 64  49  66  62  63  66    DLVA Predicted % 73  57  70  65  66  73  79        has a past medical history of Atrial fibrillation (HCC), Chest pain, Depression with anxiety, GERD (gastroesophageal reflux disease), Hemochromatosis, History of syncope, Hypothyroidism, and Osteoporosis.   reports that she quit smoking about 25 years ago. Her smoking use included cigarettes. She has a 42.00 pack-year smoking history. She has never been exposed to tobacco smoke. She has never used smokeless tobacco.  Past Surgical History:  Procedure Laterality Date   IR KYPHO LUMBAR INC FX REDUCE BONE BX UNI/BIL CANNULATION INC/IMAGING  06/30/2022   IR KYPHO LUMBAR INC FX REDUCE BONE BX UNI/BIL CANNULATION INC/IMAGING  08/16/2022   MOUTH SURGERY     SHOULDER ARTHROSCOPY Left    TOTAL ABDOMINAL HYSTERECTOMY     VARICOSE VEIN SURGERY      No Known Allergies  Immunization History  Administered Date(s) Administered   Fluad Quad(high Dose 65+) 02/18/2019, 04/21/2022   Influenza Split 03/27/2019   Influenza, High Dose Seasonal PF 08/29/2017, 04/26/2018, 04/29/2020, 05/12/2021   PFIZER(Purple Top)SARS-COV-2 Vaccination 07/22/2019, 08/12/2019, 03/11/2020   Pneumococcal Conjugate-13 06/12/2014   Pneumococcal Polysaccharide-23 06/27/2010, 04/25/2018   Tdap 03/16/2011, 07/05/2019   Zoster Recombinat  (Shingrix) 04/08/2019   Zoster, Live 06/27/2010, 04/08/2019    Family History  Problem Relation Age of Onset   Stroke Mother    Heart disease Father 4   Hemochromatosis Brother  Breast cancer Neg Hx      Current Outpatient Medications:    acetaminophen (TYLENOL) 325 MG tablet, Take 650 mg by mouth every 6 (six) hours as needed for mild pain or headache., Disp: , Rfl:    apixaban (ELIQUIS) 5 MG TABS tablet, TAKE 1 TABLET(5 MG) BY MOUTH TWICE DAILY.  Due for yearly follow-up with Dr Graciela Husbands, please call office [phone] for appointment., Disp: 180 tablet, Rfl: 0   atorvastatin (LIPITOR) 10 MG tablet, Take 10 mg by mouth daily., Disp: , Rfl:    cyanocobalamin (VITAMIN B12) 500 MCG tablet, Take 500 mcg by mouth daily., Disp: , Rfl:    doxycycline (VIBRAMYCIN) 100 MG capsule, Take 100 mg by mouth 2 (two) times daily., Disp: , Rfl:    flecainide (TAMBOCOR) 100 MG tablet, Take 1 tablet (100 mg total) by mouth 2 (two) times daily., Disp: 180 tablet, Rfl: 3   levothyroxine (SYNTHROID, LEVOTHROID) 112 MCG tablet, Take 112 mcg by mouth daily., Disp: , Rfl:    methocarbamol (ROBAXIN) 500 MG tablet, Take 1 tablet (500 mg total) by mouth daily as needed for muscle spasms., Disp: 30 tablet, Rfl: 0   metoprolol succinate (TOPROL-XL) 25 MG 24 hr tablet, TAKE 1 TABLET BY MOUTH  DAILY (Patient taking differently: Take 25 mg by mouth daily.), Disp: 90 tablet, Rfl: 2   mycophenolate (MYFORTIC) 360 MG TBEC EC tablet, TAKE 1 TABLET BY MOUTH TWICE DAILY, Disp: 180 tablet, Rfl: 3   nitroGLYCERIN (NITROGLYN) 2 % ointment, Apply 1/4 inch to fingertips three times daily as needed., Disp: 30 g, Rfl: 1   ondansetron (ZOFRAN) 4 MG tablet, Take 1 tablet (4 mg total) by mouth every 8 (eight) hours as needed for nausea or vomiting., Disp: 20 tablet, Rfl: 0   pantoprazole (PROTONIX) 20 MG tablet, Take 20 mg by mouth 2 (two) times daily., Disp: , Rfl:    predniSONE (DELTASONE) 5 MG tablet, TAKE 1 TABLET(5 MG) BY MOUTH  DAILY WITH BREAKFAST, Disp: 90 tablet, Rfl: 0   SPIRIVA RESPIMAT 2.5 MCG/ACT AERS, INHALE 2 PUFFS INTO THE LUNGS DAILY, Disp: 4 g, Rfl: 10   sulfamethoxazole-trimethoprim (BACTRIM DS) 800-160 MG tablet, TAKE 1 TABLET BY MOUTH ON MONDAY, WEDNESDAY, AND FRIDAY (Patient taking differently: Take 1 tablet by mouth See admin instructions. Take one tablet by mouth on Mondays Wednesdays and Fridays per patient), Disp: 90 tablet, Rfl: 3   Vitamin D, Cholecalciferol, 25 MCG (1000 UT) TABS, Take 1 tablet by mouth daily., Disp: , Rfl:       Objective:   Vitals:   11/24/22 0944  BP: 110/60  Pulse: 61  SpO2: 95%  Weight: 134 lb 12.8 oz (61.1 kg)  Height: 5' 3.5" (1.613 m)    Estimated body mass index is 23.5 kg/m as calculated from the following:   Height as of this encounter: 5' 3.5" (1.613 m).   Weight as of this encounter: 134 lb 12.8 oz (61.1 kg).  @WEIGHTCHANGE @  [employer]   11/24/22 0944  Weight: 134 lb 12.8 oz (61.1 kg)     Physical Exam   General: No distress. Looks well O2 at rest: no Cane present: no Sitting in wheel chair: no Frail: no Obese: no Neuro: Alert and Oriented x 3. GCS 15. Speech normal Psych: Pleasant Resp:  Barrel Chest - no.  Wheeze - no, Crackles - no, No overt respiratory distress CVS: Normal heart sounds. 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 PEERL +.  No post nasal drip        Assessment:       ICD-10-CM   1. ILD (interstitial lung disease) (HCC)  J84.9     2. 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 High risk medication use  Z79.899     4. Scleroderma (HCC)  M34.9     5. Therapeutic drug monitoring  Z51.81     6. 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 CT dec 2023 shows mild ILD  Plan -Monitor pulse ox levels at home --Otherwise  cotninue  prednisone to 5 mg per day +  myfortic and bactrim at current dose  - appreciate future interest in clinical trials - await echo  Pulmonary emphysema, unspecified emphysema type (HCC)  Plan -  Continue spiriva    Followup -3 months with Dr Marchelle Gearing - 30 min visit   - symptom score and walk test at followup  - Call or return sooner if needed    SIGNATURE    Dr. Kalman Shan, M.D., [name],  Pulmonary and Critical Care Medicine Staff Physician, [facility] Director - Interstitial Lung Disease  Program  Pulmonary Fibrosis [facility] at [facility] [place], [place], [address]  Pager: [phone], If no answer or between  15:00h - 7:00h: call [phone] Telephone: [phone]  10:17 AM 11/24/2022

## 2022-11-24 ENCOUNTER — Encounter: Payer: Self-pay | Admitting: Internal Medicine

## 2022-11-24 ENCOUNTER — Ambulatory Visit: Payer: Medicare Other | Admitting: Internal Medicine

## 2022-11-24 VITALS — BP 110/60 | HR 61 | Ht 63.5 in | Wt 134.8 lb

## 2022-11-24 DIAGNOSIS — R053 Chronic cough: Secondary | ICD-10-CM

## 2022-11-24 DIAGNOSIS — J432 Centrilobular emphysema: Secondary | ICD-10-CM

## 2022-11-24 DIAGNOSIS — R768 Other specified abnormal immunological findings in serum: Secondary | ICD-10-CM | POA: Diagnosis not present

## 2022-11-24 DIAGNOSIS — Z79899 Other long term (current) drug therapy: Secondary | ICD-10-CM | POA: Diagnosis not present

## 2022-11-24 DIAGNOSIS — J849 Interstitial pulmonary disease, unspecified: Secondary | ICD-10-CM | POA: Diagnosis not present

## 2022-11-24 DIAGNOSIS — Z5181 Encounter for therapeutic drug level monitoring: Secondary | ICD-10-CM

## 2022-11-24 DIAGNOSIS — M349 Systemic sclerosis, unspecified: Secondary | ICD-10-CM

## 2022-11-24 DIAGNOSIS — R7689 Other specified abnormal immunological findings in serum: Secondary | ICD-10-CM

## 2022-11-24 LAB — CBC WITH DIFFERENTIAL/PLATELET
Basophils Absolute: 0 10*3/uL (ref 0.0–0.1)
Basophils Relative: 0.8 % (ref 0.0–3.0)
Eosinophils Absolute: 0.1 10*3/uL (ref 0.0–0.7)
Eosinophils Relative: 1.3 % (ref 0.0–5.0)
HCT: 38.6 % (ref 36.0–46.0)
Hemoglobin: 12.6 g/dL (ref 12.0–15.0)
Lymphocytes Relative: 11.5 % — ABNORMAL LOW (ref 12.0–46.0)
Lymphs Abs: 0.7 10*3/uL (ref 0.7–4.0)
MCHC: 32.6 g/dL (ref 30.0–36.0)
MCV: 110 fl — ABNORMAL HIGH (ref 78.0–100.0)
Monocytes Absolute: 0.4 10*3/uL (ref 0.1–1.0)
Monocytes Relative: 5.9 % (ref 3.0–12.0)
Neutro Abs: 4.9 10*3/uL (ref 1.4–7.7)
Neutrophils Relative %: 80.5 % — ABNORMAL HIGH (ref 43.0–77.0)
Platelets: 220 10*3/uL (ref 150.0–400.0)
RBC: 3.51 Mil/uL — ABNORMAL LOW (ref 3.87–5.11)
RDW: 14.2 % (ref 11.5–15.5)
WBC: 6.1 10*3/uL (ref 4.0–10.5)

## 2022-11-24 NOTE — Addendum Note (Signed)
Addended by: Hedda Slade on: 11/24/2022 10:47 AM   Modules accepted: Orders

## 2022-11-26 LAB — ALLERGEN PROFILE, PERENNIAL ALLERGEN IGE
Alternaria Alternata IgE: <0.1 kU/L
Aspergillus Fumigatus IgE: <0.1 kU/L
Aureobasidi Pullulans IgE: <0.1 kU/L
Candida Albicans IgE: <0.1 kU/L
Cat Dander IgE: 0.12 kU/L — AB
Chicken Feathers IgE: <0.1 kU/L
Cladosporium Herbarum IgE: <0.1 kU/L
Cow Dander IgE: <0.1 kU/L
D Farinae IgE: <0.1 kU/L
D Pteronyssinus IgE: <0.1 kU/L
Dog Dander IgE: <0.1 kU/L
Duck Feathers IgE: <0.1 kU/L
Goose Feathers IgE: <0.1 kU/L
Mouse Urine IgE: <0.1 kU/L
Mucor Racemosus IgE: <0.1 kU/L
Penicillium Chrysogen IgE: <0.1 kU/L
Phoma Betae IgE: <0.1 kU/L
Setomelanomma Rostrat: <0.1 kU/L
Stemphylium Herbarum IgE: <0.1 kU/L

## 2022-11-28 NOTE — Progress Notes (Signed)
Mild allergy to cat dander. Please ensure that you use HEPA filter high grade in house and if there is cat try to talk to vet to see how you can reduce the allergen load from the cat. Other results are pending. We won't be calling you with these results

## 2022-12-02 ENCOUNTER — Ambulatory Visit: Payer: Medicare Other | Admitting: Physician Assistant

## 2022-12-14 ENCOUNTER — Ambulatory Visit (INDEPENDENT_AMBULATORY_CARE_PROVIDER_SITE_OTHER): Payer: Medicare Other | Admitting: Internal Medicine

## 2022-12-14 DIAGNOSIS — J849 Interstitial pulmonary disease, unspecified: Secondary | ICD-10-CM

## 2022-12-14 LAB — PULMONARY FUNCTION TEST
DL/VA % pred: 71 %
DL/VA: 2.93 ml/min/mmHg/L
DLCO cor % pred: 63 %
DLCO cor: 11.78 ml/min/mmHg
DLCO unc % pred: 62 %
DLCO unc: 11.48 ml/min/mmHg
FEF 25-75 Pre: 1.09 L/sec
FEF2575-%Pred-Pre: 81 %
FEV1-%Pred-Pre: 96 %
FEV1-Pre: 1.8 L
FEV1FVC-%Pred-Pre: 94 %
FEV6-%Pred-Pre: 108 %
FEV6-Pre: 2.56 L
FEV6FVC-%Pred-Pre: 105 %
FVC-%Pred-Pre: 102 %
FVC-Pre: 2.57 L
Pre FEV1/FVC ratio: 70 %
Pre FEV6/FVC Ratio: 100 %

## 2022-12-14 NOTE — Patient Instructions (Signed)
Spiro/DLCO performed today.  

## 2022-12-14 NOTE — Progress Notes (Signed)
Spiro/DLCO performed today.  

## 2022-12-15 ENCOUNTER — Encounter: Payer: Self-pay | Admitting: Internal Medicine

## 2022-12-15 ENCOUNTER — Telehealth: Payer: Medicare Other | Admitting: Internal Medicine

## 2022-12-15 VITALS — Ht 63.5 in | Wt 133.0 lb

## 2022-12-15 DIAGNOSIS — R053 Chronic cough: Secondary | ICD-10-CM | POA: Diagnosis not present

## 2022-12-15 DIAGNOSIS — J849 Interstitial pulmonary disease, unspecified: Secondary | ICD-10-CM

## 2022-12-15 NOTE — Addendum Note (Signed)
Addended by: Hedda Slade on: 12/15/2022 11:38 AM   Modules accepted: Orders

## 2022-12-15 NOTE — Progress Notes (Signed)
PCP [name],[name], PA-C   HPI  IOV 02/10/2016  Chief Complaint  Patient presents with   Pulmonary Consult    Pt referred by Dr. Cyndia Bent for chronic cough x 1 year. Pt states she feels she has a tickle that is causing her dry cough. Pt states she has DOE when climbing stairs. Pt deneis CP/tightness.     82 year old female with hemochromatosis homozygous gene followed by Dr. Cyndie Chime (last phlebotomy many years ago and is on serial monitoring) with children and siblings with active disease. She has also atrial fibrillation followed by Dr. Graciela Husbands. Reports insidious onset of chronic cough in the last 1 year. It is stable since onset. It fluctuates between mild and severe in severity. It is mostly dry in quality. Mostly present in the daytime but sometimes also wakes her up at night. It is definitely not progressive. It is episodic and present every day. Aggravated by talking sometimes and then the throat feels dry associated with tickle in the throat and also sensation the cough is coming from the upper chest retrosternally and sometimes relieved by drinking water or chewing on a lozenge. Also aggravated by seasonal changes particularly in the spring and the fall which makes her think she has allergies. There is sometimes associated gag. She also reports nonspecific occasional wheezing and associated shortness of breath that is nonspecific but present with exertion and relieved by rest No other clear cut aggravating or relieving factors.   cough associated history  - Medications: She is not on fish oil or ACE inhibitors - Sinus drainage: She does admit to spring allergies. She did have something removed from her hard palate several years ago by ENT does not know details - Acid reflux: She says she is significant acid reflux and is on Prilosec. Without which she'll have significant symptoms - Pulmonary disease: FenO 12 02/10/2016  and normal. She denies any personal history of asthma or  pulmonary fibrosis of COPD or emphysema smoked one pack per day started smoking at age 87 and quit in 1999. Making a 42 pack smoking history. Chest x-ray 08/26/2014 personally visualized is clear - Tobacco :  reports that she quit smoking about 18 years ago. She has a 42.00 pack-year smoking history. She has never used smokeless tobacco.       OV 02/18/2016  Chief Complaint  Patient presents with   Follow-up    Pt here after PFT and HRCT. Pt denies changes in SOB and cough. Pt denies any new complaints at this time.    Follow-up chronic cough. This visit is to follow-up on test results of function test and high resolution CT chest which are described below9 results show isolated reduction in diffusion capacity which can be explained by possible early ILD and also emphysema. There are also new findings of nodule 6 mm bilaterally on lower lobes. She prefers a very cautious approach to this evaluation.     Pulmonary function test 02/12/2016 - FVC 2.8 cm/101%, FEV1 1.9 L/91%. Ratio 60/90%. Total lung capacity 115%. DLCO reduced at 14.5/60%. She is isolated reduction in diffusion capacity    HRCT chest 02/16/16 IMPRESSION: 1. Mild patchy subpleural reticulation in both lungs with a basilar predominance. No significant traction bronchiectasis. No frank honeycombing. Findings could represent an interstitial lung disease such as nonspecific interstitial pneumonia (NSIP), with early usual interstitial pneumonia (UIP) not excluded. A follow-up high-resolution chest CT study in 12 months is recommended to assess temporal pattern stability. 2. Bilateral  lower lobe solid pulmonary nodules, largest 6 mm. Non-contrast chest CT at 3-6 months is recommended. If the nodules are stable at time of repeat CT, then future CT at 18-24 months (from today's scan) is considered optional for low-risk patients, but is recommended for high-risk patients. This recommendation follows the consensus statement:  Guidelines for Management of Incidental Pulmonary Nodules Detected on CT Images:From the Fleischner Society 2017; published online before print (10.1148/radiol.[id number]). 3. Additional findings include mild-to-moderate centrilobular emphysema, aortic atherosclerosis and 2 vessel coronary atherosclerosis.     Electronically Signed   By: Delbert Phenix M.D.   On: 02/16/2016 14:14    OV 08/31/2016  Chief Complaint  Patient presents with   Follow-up    HRCT was never scheduled. Pt states that the cough is still present >> slightly improved since last OV. Pt states that she feels her allergies has gotten it ramped up again.    Follow-up multifactorial cough associated with autoimmune antibody positive, interstitial lung disease and emphysema previous history of smoking  After last visit in August 2017 she had high resolution CT chest in December 2017 that showed persistence of ILD. I personally visualized the CT chest. He comes for follow-up. She tells me that since August 2017 she has insidious onset of shortness of breath and is progressively worse. It is mild and present only on inclines is not therefore tenderness. In terms of her cough is persistent. It is associated with significant sinus drainage that she thinks is allergy related. She constantly clears her throat. Lab review shows autoimmune antibody positive in August 2017 with scleroderma antibody slightly elevated at 6.4. I referred her to rheumatology but she does not remember this and she did not make this follow-up. She also tells me that she definitely has a long history of [name] phenomena in her fingers but no one has ever formally diagnosed with connective tissu  e disease.  OV 11/28/2016  Chief Complaint  Patient presents with   Follow-up    Pt here after PFT. Pt states her breathing is uchanged since last OV. Pt c/o dry cough.- pt states this has slightly improved since last OV. Pt denies CP/tightness and f/c/s.      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 Since then she has seen rheumatologis Dr. Algis Downs. I reviewd the notes and also discussed with the patient wa a good understading of what s going on. She tells me that some of the lupus antibodies have been positive correlating with Raynaud. Patient is on beta blocker for atrial fbrillation and it is the preference by the rheumatologist Dr. D that she switc to calcium channel blocker. She has a follow-up appointment pending with her electrophysiologist Dr. Graciela Husbands.  She is scleroderma antibody positive but according to her rheumtolgist she does not have dermatologic  Manifestation of the disese. 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 She pulm  function test today and this is stable. In terms of arrest or symptoms she stable. The cough is actually improved. But she does get dyspneic walking up stairs especially a few flights. She does not wnt her to pulmonary habilitation because se exrcises on the treadmill although she does not monitor her saturations.   reports that she quit smoking about 19 years ago. She has a 42.00 pack-year smoking history. she has never used smokeless tobacco.  OV 06/12/2017  Chief Complaint  Patient presents with   Follow-up    Pt states that she has been doing good since last visit. States that she has a "tickle cough" that is sporadic and has SOB that she states is when she climbs 2 flights of stairs. Denies any CP.    Follow-up combined emphysema was interstitial lung disease [autoimmune undifferentiated connective tissue disease interstitial lung disease]. Autoimmune features from May 2018 rheumatology noted with Dr. Algis Downs: Raynaud phenomenon positive without gangrene, positive anti-cardiolipin and positive IgM and SCL-70 positive without clinical features of  scleroderma    Last seen June 2018. Since then she's stable. Overall she tells me that only problem is a tickle in her throat and slight cough. This is up and down depending on the pollen exposure she gets. She gets dyspneic for climbing few to several flights of stairs. This is unchanged. She did have full function test today and that shows mild significant worsening though overall gradient is only mild.. However she's not feeling this. She has appointment with Dr. Algis Downs  pending    OV 07/18/2017  Chief Complaint  Patient presents with   Follow-up    HRCT done 07/03/17.  Pt states she is the same as she was at last visit. SOB with exertion.    Follow-up combined emphysema was interstitial lung disease [autoimmune undifferentiated connective tissue disease interstitial lung disease]. Autoimmune features from May 2018 rheumatology noted with Dr. Algis Downs: Raynaud phenomenon positive without gangrene, positive anti-cardiolipin and positive IgM and SCL-70 positive without clinical features of scleroderma    ANGELENA KIRALY presents for follow-up.  She is here to discuss the results of a high-resolution CT scan of the chest.  The scan was reviewed by Dr. Fredirick Lathe thoracic radiology who feels that patient has probable UIP pattern that is definitely progressive compared to August 2017 although there is no comment about progression since June 2018 CT scan.  The pulmonary nodules itself are stable since August 2017 and are likely benign.  The change in the CT scan which is mild progression pulmonary fibrosis corresponds with the pulmonary function test that shows mild progression.  She is now here with her daughter Denny Peon and her husband.  She also states that 1 of her other daughters has rheumatoid arthritis and is on TNF alpha blockade.  All her 3 daughters have hemo-chromatosis    OV 08/29/2017  Chief Complaint  Patient presents with   Follow-up    Pt states she has been doing good since last visit. Pt  recently went to [place] and had some mild problems with SOB due to heat.  Pt was prescribed cellcept and bactrim but has not yet started meds due to questions with other meds she is taking.    82 year old female with ILD secondary to autoimmune disease clinically suspicious of scleroderma but also previous history of hemochromatosis with family history of hemochromatosis.  She is here with her husband.  This visit is only a discussion visit.  Because she has many questions about starting CellCept before she actually does.  We did hepatitis virus panel, QuantiFERON gold, G6PD and all this is normal.  Lab work is normal.  I checked with [facility] pharmacist about interactions and was cleared for her to start CellCept.  The only recommendation was for patient to come off Prilosec because of reduced effect of CellCept.  Patient questions revolved around CellCept: She does not want to come off PPI because of hiatal hernia and has  had bad acid reflux.  So I would not [place] Hun asking for any alternative PPI.  In addition she states she takes multiple multivitamins including Biotene, vitamin D3, vitamin D, vitamin B12 and folic acid.  She also takes Allegra occasionally.  She wants to make sure it is all okay with CellCept.  She has not had a flu shot today and she is asking if she should have it.  She has never had flu shot before.  She is also wondering about anticoagulation in the setting of CellCept and thyroid issues in the setting of Bactrim.  In terms of her genetics: Initially I corresponded with our local geneticist who thought she might be better served at [facility] but the [facility] genetics person wrote to me saying that patient should first be seen by [facility] rheumatology and hematology.  Patient's not so sure she wants to see the subspecialist.  She will speak to Dr. Cyndie Chime her local hematologist to see if it is worthwhile seeing the hematology department at [facility].   Lungs/Pleura: Moderate centrilobular emphysema. Mild basilar predominant subpleural reticulation and ground-glass, increased from 02/16/2016. No traction bronchiectasis/ bronchiolectasis, architectural distortion or honeycombing. Side-by-side nodules in the medial left lower lobe measure up to 6 mm (series 3, image 95), unchanged from 02/16/2016 and considered benign. No air trapping. No pleural fluid. Airway is unremarkable.  IMPRESSION: 1. Mild progression in basilar predominant subpleural fibrosis from 02/16/2016, raising suspicion for usual interstitial pneumonitis. 2. Aortic atherosclerosis (ICD10-170.0). Moderate coronary artery calcification. 3.  Emphysema (ICD10-J43.9).     Electronically Signed   By: Leanna Battles M.D.   On: 07/03/2017 09:54    OV 09/19/2017  Chief Complaint  Patient presents with   Follow-up    Pt states she has been doing good. Currently on cellcept and bactrim and has been doing well on it once put back on omeprazole. Pt still has the cough and sometimes when trying to take a deep breath can't fully get all O2 out.   Mendie Mcbeth returns for follow-up.  This is for autoimmune interstitial lung disease.  She was started on CellCept recently.  She is here to follow-up for therapeutic drug monitoring.  Because she was on omeprazole and we were initiating CellCept with Bactrim we asked her to hold off on omeprazole because the omeprazole impairs drug levels of CellCept.  However she tried it for a week and her acid reflux was significant despite ranitidine so she went back on omeprazole.  Pharmacist Berlin Hun is advising changing the CellCept to equivaekbtdose of Myfortic. So far tolerating cellcept/pred/bactrimm well. Has seen dR G in heme and on observation. Had PFT today on cellcept/pred/bactrim and is better! Continue siwht spriva  OV 10/31/2017  . Chief Complaint  Patient presents with   Follow-up    Pt currently taking  myfortic and states she has been well on that. Pt states she has some mild nausea and has some problems with sleeping at night. SOB is stable,mild coughing. Denies any CP.    Follow-up progressive interstitial lung disease with SCL-70 antibody positive and Raynard Follow-up associated emphysema.  Last visit September 19, 2017.  At that time she had intolerance to the CellCept therefore we switched her to Myfortic.  She is here to report for follow-up about this.  She is tolerating Myfortic just well.  She had some transient insomnia for a few days last week but this resolved.  She only has mild intermittent occasional nausea with the Myfortic  but otherwise is tolerating it really well at the low-dose of 360 mg twice daily.  She continues on prednisone 10 mg daily and Bactrim 3 times a week.  She is due for blood work today.  There are no other new issues.  She continues on Spiriva for her emphysema   OV 02/01/2018  Chief Complaint  Patient presents with   Follow-up    PFT performed today.  Pt stated other than having shingles earlier, things have been doing good for her. Pt states SOB is stable and states she still has an occ cough.    Follow-up progressive interstitial lung disease with SCL-70 antibody positive and Raynud - IPAF Follow-up associated emphysema   KARLYE DIMMOCK presents for routine follow-up with interstitial lung disease clinic. She has the above problems. In the interim she tells me she had developed shingles on the right neck but this is resolved. This is despite having zoster vaccine in 2012. She was yet to have the bnnew  shingles vaccine.in any event currently she is feeling well. She only has occasional post residual neuropathic pain. In terms of her shortness of breath it is stable and minimal. Walking desaturation test shows stability. Pulmicort function test below show stability. She is tolerating the mycophenolate myfortic well at 360 mg twice daily and prednisone 10 mg  daily and Bactrim for PCP prophylaxis. Overall she feels stable and feels that it is best medicines on touch in terms of dosing change  OV 08/14/2018  Subjective:  Patient ID: [name], female , DOB: [birth year] , age 76 y.o. , MRN: [id number] , ADDRESS: [address] [place] [address]   08/14/2018 -   Chief Complaint  Patient presents with   Follow-up    PFT performed today.  Pt states she is slowly getting better after her recent pna. States she still has some issues with SOB and an occ cough. Pt also states that she feels like she has a weight on her chest at times.    Follow-up progressive interstitial lung disease with SCL-70 antibody positive and Raynud - IPAF - on myfortic since march 2019 with pred 10 and bactrim  Follow-up associated emphysema - on spiriva    HPIfortic GISSELLA COVERDALE 82 y.o. -returns for 58-month follow-up of her interstitial lung disease associated with emphysema.  She presents with her middle daughter Denny Peon who is met me before.  She continues on Myfortic and prednisone 10 mg and Bactrim prophylaxis.  She continues on Spiriva.  She had an episode of atrial fibrillation according to review of the chart and my on history with in the middle of January 2020.  At this time she had a chest x-ray that showed asymptomatic upper lobe pneumonia.  I personally visualized the chest x-ray and confirmed the findings.  Antibiotic was not prescribed.  Then I follow this up with a CT scan of the chest [we canceled her elective colonoscopy].  CT chest showed improved pneumonia findings.  I prescribed even though she was feeling well cephalexin for a week.  She says she took it.  She continues to feel well except she has baseline amount of symptoms that is documented below.  Of note the high-resolution CT chest showed an improvement in ILD compared to a year ago.  In fact her pulmonary function tests are also showing stability/improvement as documented below.  She is very  happy about this outcome with immunosuppression .  However, she is a little bit unsure why she  is symptomatic in terms of shortness of breath and cough.  She is very concerned that her fibrosis is getting worse.  She wants to have closer follow-up than every 6 months.   IMPRESSION: HRCT Jan 2020 1. Vaguely bandlike patchy consolidation and ground-glass opacity at the periphery of the right upper and superior segment right lower lobes, new since 07/03/2017 chest CT, improving compared to 07/10/2018 chest radiograph, favoring resolving pneumonia with evolving postinfectious scarring. 2. Previously described basilar predominant subpleural reticulation and ground-glass attenuation is minimal and appears decreased in the interval. No bronchiectasis or honeycombing. Findings may represent an interstitial lung disease such as nonspecific interstitial pneumonia (NSIP) or less likely early usual interstitial pneumonia (UIP). Findings are indeterminate for UIP per consensus guidelines: Diagnosis of Idiopathic Pulmonary Fibrosis: An Official ATS/ERS/JRS/ALAT Clinical Practice Guideline. Am Rosezetta Schlatter Crit Care Med Vol 198, Iss 5, [phone], Feb 25 2017. 3. Moderate centrilobular emphysema with mild diffuse bronchial wall thickening, suggesting COPD. 4. Two-vessel coronary atherosclerosis. 5. Small hiatal hernia.   Aortic Atherosclerosis (ICD10-I70.0) and Emphysema (ICD10-J43.9). ROS - per HPI    OV 04/22/2019  Subjective:  Patient ID: [name], female , DOB: [birth year] , age 88 y.o. , MRN: [id number] , ADDRESS: [address] [place] [address]   Follow-up slowly progressive interstitial lung disease with SCL-70 antibody positive and Raynud - IPAF - on myfortic since march 2019 with pred 5 and bactrim 3x/week  Follow-up associated emphysema - on spiriva   04/22/2019 -   Chief Complaint  Patient presents with   Follow-up    Patient reports that her breathing is doing well  at this time.      HPI TAIZ HORTON 82 y.o. -presents for follow-up.  Last seen by myself in early part of 2020 before the pandemic.  At that time CT chest suggested possible progression.  She saw a nurse practitioner in the interim after the onset of the pandemic.  She was anxious about the progression of her ILD so she asked for a CT scan in less than 1 year.  A CT scan of the chest was done in September 2020.  This is deemed stable since early part of the year but with possible progression versus stability since January 2019.  In terms of her symptoms she continues to be stable with very minimal shortness of breath and cough.  Her pulmonary function test below also shows stability.  Her walking desaturation test also shows stability.  She is tolerating her Myfortic, prednisone and 3 times a week of Bactrim just fine.  There are no side effects.  Last blood work was in August 2020 and this was reviewed.  We discussed the possibility of switching to antifibrotic nintedanib instead of taking immunosuppressants.  The rational for this would be it is a safer drug with less long-term side effect such as lack of immunosuppression.  However she feels her current regimen is working well for her.  She also feels that she would benefit more from a immunosuppressive drug because of her autoimmune antibodies and therefore she is more inclined to continue with her current immunosuppressive regimen of prednisone 5 mg/day and Myfortic and Bactrim 3 times a week.        OV 07/04/2019  Subjective:  Patient ID: [name], female , DOB: 1941/05/16 , age 35 y.o. , MRN: [id number] , ADDRESS: [address] [place] [address]   Follow-up mildy progressive - stable interstitial lung disease with SCL-70 antibody positive  and Raynud - IPAF - on myfortic since march 2019 with pred 5 and bactrim 3x/week  Follow-up associated emphysema  Follow-up Raynard  Has associated hemochromatosis  Has  associated atrial fibrillation on Xarelto   07/04/2019 -   Chief Complaint  Patient presents with   Follow-up    Pt states she has been doing well since last visit and denies any complaints.     HPI SENTA RUBINSON 82 y.o. -last visit was in October 2020.  At that time she was doing well.  Given her continued immunosuppression with prednisone and Myfortic we discussed the possibility of switching the Myfortic to the nintedanib but understanding that there uncertainties whether this is superior approach and if she could end up with temporary side effects of the nintedanib.  Based on that she decided to continue with prednisone, Myfortic and Bactrim as she was doing because things are working well.  However she later called in and wanted this change to nintedanib.  We did this change to nintedanib but then her co-pay ended up being over 500+ dollars and then she started also getting worried about the side effects with nintedanib particularly GI.  She is also on Xarelto for atrial fibrillation.  Therefore she made this visit ahead of schedule to discuss the possibility of making the switch.  I understand from her that it will be 500+ dollars every month with nintedanib as a co-pay.  In addition she is worried about the superiority of taking nintedanib.  She fully understands that Myfortic over length of time can carry cancer risk and immunosuppression risk but at this point in time she feels well and she is tolerating it well.  She also understands that nintedanib can carry temporary GI side effects.  She also understands that it may not be a superior approach.  She understands it could be an equivalent approach and definitely lower immunosuppression.  Her main systemic immunosuppressive features are lung disease and Raynaud.  She is worried this might flareup.  In terms of her symptoms there is stable as documented below.  Her walking desaturation test is also stable.   Last pulmonary function test  was in August r 2020 Last CT scan of the chest was in September 2020 Last blood work October 2020 Last echocardiogram 2012.  ROS - per HPI  OV 10/22/2019  Subjective:  Patient ID: [name], female , DOB: 05/17/1941 , age 62 y.o. , MRN: [id number] , ADDRESS: [address] [place] [address]   Follow-up mildy progressive - stable interstitial lung disease with SCL-70 antibody positive and Raynud - IPAF - on myfortic since march 2019 with pred 5 and bactrim 3x/week  Follow-up associated emphysema  Follow-up Raynaud  Has associated hemochromatosis  Has associated atrial fibrillation on Xarelto    10/22/2019 -   Chief Complaint  Patient presents with   Follow-up    PFT performed today. Pt states she has been doing good since last visit. Denies any complaints with breathing.     HPI IMANI KISSELL 82 y.o. -returns for routine follow-up.  Overall doing well.  She feels shortness of breath is stable.  She feels she is tolerating Myfortic and prednisone fine no new interim issues she has had a Covid vaccine.  She continues on high risk immunosuppressive therapy.  There is some mild diarrhea with the Myfortic but it is tolerable.  The shortness of breath scale is stable walking desaturation test is stable.  Her pulmonary function test shows  stable FVC with a possible reduction in diffusion capacity.  I do not know what to make of this at this point in time.  It could be a technical issue because she is feeling stable.  She did have echocardiogram that showed grade 2 diastolic dysfunction.   She continues on her Spiriva for associated emphysema.  ROS - per HPI     OV 03/03/2020   Subjective:  Patient ID: [name], female , DOB: 1941-04-13, age 49 y.o. years. , MRN: [id number],  ADDRESS: [address] 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 -returns for follow-up.  Overall she is doing well.  However in the last 1-2 months she says she is slightly more short of breath than usual.  She is also having slight increase in dry cough.  Based on her subjective symptom questionnaires it seems the cough itself is stable but she is definitely saying it is worse.  She is seeing the dyspnea is overall stable compared to the cough but looking at the symptom questionnaire is maybe it is a little worse.  Is hard to discern.  It has been approximately 1 year since she had a high-resolution CT chest.  She is wondering if she can have this repeated.  She is on immunosuppression.  She is also noticing -that at home her pulse oximetry on her finger varies a lot [she does have [name]]) and sometimes can be as low as 88% - 93%.  She does acknowledge that the circulation in her hands is not as good.  Pulse ox here at rest was normal.  She had pulmonary function test that shows stability.-With the FVC although I am not sure what to make of the DLCO.  She has had a Covid booster vaccine  Last echocardiogram January 2021 with mild diastolic dysfunction    IMPRESSION: 1. Very mild basilar predominant subpleural reticulation and ground-glass, similar to minimally progressive from 07/03/2017. Nonspecific interstitial pneumonitis is favored. Findings are indeterminate for UIP per consensus guidelines: Diagnosis of Idiopathic Pulmonary Fibrosis: An Official ATS/ERS/JRS/ALAT Clinical Practice Guideline. Am Rosezetta Schlatter Crit Care Med Vol 198, Iss 5, [phone], Feb 25 2017. 2. Aortic atherosclerosis (ICD10-170.0). Coronary artery calcification. 3. Enlarged pulmonic trunk, indicative of pulmonary arterial hypertension. 4.  Emphysema (ICD10-J43.9).     Electronically Signed   By: Leanna Battles M.D.   On: 03/20/2019 14:47  Echocardiogram -January 2021: Shows grade 2 diastolic dysfunction but otherwise unremarkable.   ROS - per HPI  HPI CHATNEY GREGORY 81 y.o. -last visit was in September 2021.  She continues to do well overall.  Shortness of breath symptom question is stable.  Walking desaturation test is stable.  She is compliant with her medications.  In September 2021 there is concern that her ILD was getting worse because of change in PFT.  So we did a CT scan of the chest radiologist reported this to be stable since 2017.  Most recently she had blood work in December 2021 chemistry panel is normal and I reviewed it.  CBC panel is also normal with stable mild anemia hemoglobin 12.4 g%.  She is up-to-date with her COVID vaccine including booster.  She had pulmonary function test today that showed some fluctuation.  Her main concern is that she is coughing more  In terms of her cough it is dry and has a barking quality to it.  Or laryngeal quality to it.  It is mostly at day and night but never wakes her up at night.  When she has it at night it is only when she wakes up.  Talking makes it worse.  Laughing makes it worse.  Drinking water makes it better.  She thinks it is progressive.  Symptom score showed the cough is getting worse and deep.  She has no fever or chills.  No desaturations.  We discussed the possibility of opportunistic infection but she rightfully questant if it could be opportunistic infection if everything else is stable.  There is no wheezing.  She is wondering if it could be because of allergies.  She has seasonal allergies.  Review of the records indicate this has not been  investigated.  She is willing to have an investigation for this.  We discussed the possibility of cough neuropathy.  She recollects has had gabapentin before when she had shingles.  She did not have any side effects from it.     CT Chest data sept 2021  TECHNIQUE: Multidetector CT imaging of the chest was performed following the standard protocol without intravenous contrast. High resolution imaging of the lungs, as well as inspiratory and expiratory imaging, was performed.   COMPARISON:  09/09/2019, 03/20/2019, 07/03/2017, 12/06/2016, 06/15/2016   FINDINGS: Cardiovascular: Aortic atherosclerosis. Normal heart size. Three-vessel coronary artery calcifications. No pericardial effusion.   Mediastinum/Nodes: No enlarged mediastinal, hilar, or axillary lymph nodes. Thyroid gland, trachea, and esophagus demonstrate no significant findings.   Lungs/Pleura: Moderate centrilobular emphysema. No significant interval change in subtle irregular peripheral interstitial opacity predominantly seen in the bilateral lung bases, although also noted in nondependent portions of the right middle lobe and lingula. Mild lobular air trapping on expiratory phase imaging. Stable, definitively benign and faintly rim calcified 6 mm nodule of the dependent left lower lobe (series 5, image 86). No pleural effusion or pneumothorax.   Upper Abdomen: No acute abnormality.   Musculoskeletal: No chest wall mass or suspicious bone lesions identified.   IMPRESSION: 1. No significant interval change in subtle irregular peripheral interstitial opacity predominantly seen in the bilateral lung bases, although also noted in nondependent portions of the right middle lobe and lingula. There is no appreciable change in these findings on multiple prior examinations dating back to 2017. Bland post infectious or inflammatory scarring is favored, although minimal fibrotic interstitial lung disease is not excluded and  if characterized by ATS pulmonary fibrosis criteria, findings are consistent with an early "indeterminate for UIP" pattern. Consider ongoing follow-up CT to assess for stability of fibrotic findings. Findings are indeterminate for UIP per consensus guidelines: Diagnosis of Idiopathic Pulmonary Fibrosis: An Official ATS/ERS/JRS/ALAT Clinical Practice Guideline. Am Rosezetta Schlatter Crit Care Med Vol 198, Iss 5, [phone], Feb 25 2017. 2. Emphysema (ICD10-J43.9). 3. Coronary artery disease.  Aortic Atherosclerosis (ICD10-I70.0).     Electronically Signed   By: Lauralyn Primes M.D.   On: 03/16/2020 14:12  OV 02/23/2021  Subjective:  Patient ID: [name], female , DOB: [birth year] , age  63 y.o. , MRN: [id number] , ADDRESS: [address] [place] [address] PCP Sigmund Hazel, MD Patient Care Team: Sigmund Hazel, MD as PCP - General (Family Medicine) Duke Salvia, MD as PCP - Cardiology (Cardiology) Levert Feinstein, MD as Consulting Physician (Hematology) Duke Salvia, MD as Consulting Physician (Cardiology) Sharrell Ku, MD as Consulting Physician (Gastroenterology)  This Provider for this visit: Treatment Team:  Attending Provider: Kalman Shan, MD    02/23/2021 -   Chief Complaint  Patient presents with   Follow-up    Pt states she has been doing okay since last visit and denies any complaints.    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  Follow-up associated emphysema  Follow-up Raynaud  Has associated hemochromatosis  Has associated atrial fibrillation on Xarelto  Mild associated diastolic dysfunction and echo January 2021  HPI ELVETA KESKE 82 y.o. -returns for her 76-month follow-up.  Last visit with me was in February 2022.  Is   She continues on Myfortic prednisone and Bactrim.  We  held off on nintedanib given stability.  She did have spirometry in February 2022.?  There is a slight decline but she feels stable.  Symptom scores are stable.  Walking desaturation test is below and is stable versus a three-point drop that is?  Significant..  Last CT scan September 2021.  But clinically she feels stable.  We held off on nintedanib last visit because of stability.       OV 07/23/2021  Subjective:  Patient ID: [name], female , DOB: 11/02/1940 , age 34 y.o. , MRN: [id number] , ADDRESS: [address] [place] [address] PCP Sigmund Hazel, MD Patient Care Team: Sigmund Hazel, MD as PCP - General (Family Medicine) Duke Salvia, MD as PCP - Cardiology (Cardiology) Levert Feinstein, MD as Consulting Physician (Hematology) Duke Salvia, MD as Consulting Physician (Cardiology) Sharrell Ku, MD as Consulting Physician (Gastroenterology)  This Provider for this visit: Treatment Team:  Attending Provider: Kalman Shan, MD    07/23/2021 -   Chief Complaint  Patient presents with   Follow-up    Pt states she has been doing okay and denies any complaints.    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  Follow-up associated emphysema  Follow-up Raynaud  Has associated hemochromatosis  Has associated atrial fibrillation on Xarelto  Mild associated diastolic dysfunction and echo January 2021  HPI SHARINE WIEDMANN 82 y.o. -returns for 62-month follow-up.  Last seen in August 2022.  She feels since then dyspnea slightly worse.  This is reflected in the symptom score below.  In December 2022 he did have COVID and she recovered from it.  She does not think the decline is because of the COVID.  She continues on her prednisone Bactrim and Myfortic.  Her last CT scan of the chest was in  September 2021.  Last echocardiogram was in January 2021.  Last pulmonary function test was almost 1 year ago in February 2022.  She is wondering about repeat investigations.  She is concerned about worsening ILD.    Of note she just turned 82 years old.  OV 09/09/2021  Subjective:  Patient  ID: [name], female , DOB: [birth year] , age 76 y.o. , MRN: [id number] , ADDRESS: [address] [place] [address] PCP Sigmund Hazel, MD Patient Care Team: Sigmund Hazel, MD as PCP - General (Family Medicine) Duke Salvia, MD as PCP - Cardiology (Cardiology) Levert Feinstein, MD as Consulting Physician (Hematology) Duke Salvia, MD as Consulting Physician (Cardiology) Sharrell Ku, MD as Consulting Physician (Gastroenterology)  This Provider for this visit: Treatment Team:  Attending Provider: Kalman Shan, MD    09/09/2021 -   Chief Complaint  Patient presents with   Follow-up    PFT performed today. Pt states she has been doing okay and denies any complaints.    HPI SHANDOLYN CUNDARI 82 y.o. -returns for follow-up.  At this point in time she is feeling stable.  She had work-up to see if her ILD is worse or she has developed pulmonary hypertension.  CT scan shows stability since 2021.  Her pulmonary function test shows a 3% decline in DLCO.  On an average she has 3% decline per year over the last 4 years.  At this point in time she is feeling well.  No side effects from Myfortic.  She asked about options if she were to get worse.  We discussed about nintedanib.  Also indicated there is an injection [tocilizumab] but at this point in time she is fine with the choices.  She does understand Myfortic is immunosuppressant and there is also cancer risk.  She is decided to continue.  She also has mild emphysema for which she is on Spiriva and it is stable.  I gave her a book called Thursdays with Dennard Nip written by the patient who has pulmonary fibrosis and scleroderma.  She  is going to read it.  We did discuss briefly clinical trials as a care option and she is interested  -No clearing of the throat.  -She has seen rheumatology recently.  She continues to have [name].  They have a nitroglycerin.  She does not want to try it.  We discussed about using heart warmers.  She is going to buy this   CT Chest data  XR C-ARM NO REPORT  Result Date: 09/07/2021 Please see Notes tab for imaging impression.      OV 04/21/2022  Subjective:  Patient ID: [name], female , DOB: 02/19/1941 , age 80 y.o. , MRN: [id number] , ADDRESS: [address] [place] [address] PCP Sigmund Hazel, MD Patient Care Team: Sigmund Hazel, MD as PCP - General (Family Medicine) Duke Salvia, MD as PCP - Cardiology (Cardiology) Levert Feinstein, MD as Consulting Physician (Hematology) Duke Salvia, MD as Consulting Physician (Cardiology) Sharrell Ku, MD as Consulting Physician (Gastroenterology)  This Provider for this visit: Treatment Team:  Attending Provider: Kalman Shan, MD  04/21/2022 -   Chief Complaint  Patient presents with   Follow-up    6 Follow-up PT states no changes, DOE (upstairs, fast paced walking)     HPI BRANDT PORTZ 82 y.o. -returns for follow-up.  She tells me that since last seeing me since the summer or so she is slightly more short of breath especially when she climbs stairs but overall symptom score is stable.  In September 2023 she went on vacation to [facility] and came back with her girlfriends and she had a great time but she did notice more shortness of breath than usual.  She attributed this to heavier exertion but nevertheless with  stairs she is finding herself more short of breath.  She is compliant with Spiriva for associated emphysema Myfortic Bactrim and daily prednisone.  2 days ago she saw Sherron Ales rheumatology PA for her scleroderma and has been deemed stable  Pulmonary function test shows  slight reduction in FVC over time but still within normal limits but the DLCO is dramatically low.  There is no anemia correction on the DLCO.  When I did a rough anemia correction it appears that DLCO might be okay.  Of asked for a formal correction on the DLCO for anemia.  Given her anemia which appears to be relatively more pronounced compared to 11-Jun-2023greater than 1.5 g% drop she has been referred by rheumatology to hematology because of history of hemochromatosis.  Her last echo and CT scan of the chest were earlier this year  We discussed about the 3 different respiratory vaccines.  Strongly recommended COVID mRNA because she is immunocompromised.  She did have outpatient COVID December 2022 but it was a different strain.  Also recommend high-dose flu shot which she will get today and we discussed RSV vaccine.     PFT  OV 05/26/2022  Subjective:  Patient ID: [name], female , DOB: Oct 21, 1940 , age 47 y.o. , MRN: [id number] , ADDRESS: [address] [place] [address] PCP Sigmund Hazel, MD Patient Care Team: Sigmund Hazel, MD as PCP - General (Family Medicine) Duke Salvia, MD as PCP - Cardiology (Cardiology) Duke Salvia, MD as Consulting Physician (Cardiology) Sharrell Ku, MD as Consulting Physician (Gastroenterology) Jaci Standard, MD as Consulting Physician (Hematology and Oncology)  This Provider for this visit: Treatment Team:  Attending Provider: Kalman Shan, MD    05/26/2022 -   Chief Complaint  Patient presents with   Acute Visit    Pt started having complaints of dry cough, wheezing, and chest discomfort which began 1 week ago. Has done multiple covid tests which all have come back negative. States she is now coughing up phlegm and also has complaints of a headache and has no energy.   HPI SHUNTIA COPAS 82 y.o. -she is making an acute visit.  She presents with her daughter Denny Peon who I have met before.  She was on a  [place] cruise out of [place] between May 14, 2022 and Saturday after Thanksgiving May 20, 2022.  On Friday after Thanksgiving she suddenly took ill.  Symptoms got worse on Saturday when she returned.  She describes laryngitis ear pain cough headaches sore throat runny nose brain fog increased fatigue and sleeping.  COVID test x 2 was negative.  She called our office and we gave her antibiotic and prednisone because the sputum is now yellow.  She is taking this but she wanted an acute visit today.  Some symptoms are better.  She is saying that her headache is better sore throat is better congestion is better and the tiredness is better but according to the daughter she still sleeping a lot.  Her cough nature has changed and she is got some yellow sputum now.  So some symptoms are not better.  Walking desaturation test is stable but she only walked 2 laps and she stopped because of fatigue.  The cough is particularly worse at night.  She does not want any cough medication  OV 07/08/2022  Subjective:  Patient ID: [name], female , DOB: [birth year] , age 89 y.o. , MRN: [id number] , ADDRESS: [address]  [place] [address] PCP Sigmund Hazel, MD Patient Care Team: Sigmund Hazel, MD as PCP - General (Family Medicine) Duke Salvia, MD as PCP - Cardiology (Cardiology) Duke Salvia, MD as Consulting Physician (Cardiology) Sharrell Ku, MD as Consulting Physician (Gastroenterology) Jaci Standard, MD as Consulting Physician (Hematology and Oncology)  This Provider for this visit: Treatment Team:  Attending Provider: Kalman Shan, MD     07/08/2022 -   Chief Complaint  Patient presents with   Follow-up    Spiro/dlco done today. She states had a hospitalization 06/20/22 with compression fx. Her cough is about the same- occ prod and has to clear throat but does not cough out the sputum. Breathing is at baseline.      HPI MADYSN FARACE 82 y.o.  -returns for follow-up.  I saw her just after Thanksgiving 2023 when she had come back from a cruise and was having respiratory symptoms of acute bronchitis.  She at that time was through her doxycycline and prednisone taper she had significant cough.  It appears that after that visit her cough continued unabated.  She was coughing a lot at night.  Then she ended up in December 2023 according to history and review of the external records in the hospital with L2 spinal compression fracture.  She believes the cough caused it.  I did explain to her that I have clinically not seen lumbar spinal compression fracture from cough [I subsequently discussed with Dr. Sandrea Hughs about cough expert and he has seen a few patients with lumbar spine compression fracture with severe cough].  In the hospital hemoglobin was 12.3 g%.  And a creatinine was 0.8 mg percent.  She is now back home she is improving.  She is status post kyphoplasty 06/30/2022.  She still has a cough she says she is 80% better compared to when she saw me in November 2023.  She still has some nighttime cough.  She is willing to try some Tessalon Perles all she is tried so far is Delsym cough syrup.  She continues on her chronic immunosuppressive medications.  At the last visit I ordered an echocardiogram: This is currently pending and is going to be done soon  At the last visit I ordered high-resolution CT chest but in the hospital she did have a CT angiogram: Mild ILD is reported although this is a contrasted CT.  This was done on 06/20/2022.  I personally visualized and independently agree with the findings  She did a pulmonary function test today: This is stable.     OV 11/24/2022  Subjective:  Patient ID: [name], female , DOB: January 17, 1941 , age 89 y.o. , MRN: [id number] , ADDRESS: [address] [place] [address] PCP Sigmund Hazel, MD Patient Care Team: Sigmund Hazel, MD as PCP - General (Family Medicine) Duke Salvia, MD as PCP - Cardiology (Cardiology) Duke Salvia, MD as Consulting Physician (Cardiology) Sharrell Ku, MD as Consulting Physician (Gastroenterology) Jaci Standard, MD as Consulting Physician (Hematology and Oncology)  This Provider for this visit: Treatment Team:  Attending Provider: Kalman Shan, MD  11/24/2022 -   Chief Complaint  Patient presents with   Follow-up    Annual f/up     HPI [name] 82 y.o. -returns for follow-up.  She says her shortness of breath itself is stable.  Exercise size sit/stand desaturation test today stable.  No new medical problems.  No emergency room visits.  However on memorial day weekend she did go to the emergency room and had an infection of the right small toe.  She is on doxycycline.  Other than that she is well.  But the main issue is that her chronic severe cough persist.  This cough got worse in November 2023/January 2024 after a cruise and viral infection and spinal fracture.  This continues unabated.  Wakes her up at night.  As a tickling of the chest there is no wheezing there is no fever there is a dry cough.  Review of the labs indicate she has never had allergy panel test.  Her eosinophils are slightly elevated.  Her last CT scan of the chest was in December 2023 but was an angiogram.  Showed mild ILD.   feNO could NOT do  OV 12/15/2022  Subjective:  Patient ID: [name], female , DOB: [birth year] , age 57 y.o. , MRN: [id number] , ADDRESS: [address] [place] [address] PCP Sigmund Hazel, MD Patient Care Team: Sigmund Hazel, MD as PCP - General (Family Medicine) Duke Salvia, MD as PCP - Cardiology (Cardiology) Duke Salvia, MD as Consulting Physician (Cardiology) Sharrell Ku, MD as Consulting Physician (Gastroenterology) Jaci Standard, MD as Consulting Physician (Hematology and Oncology)  This Provider for this visit: Treatment Team:  Attending Provider: Kalman Shan,  MD   Follow-up mildy sllowy progressive - stable interstitial lung disease with SCL-70 antibody positive, atnticardiolipnd abd + and Raynud - IPAF - on myfortic since march 2019 with pred 5 and bactrim 3x/week  -Last CT feb 2023 without progerssion.  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  Has associated hemochromatosis  Has associated atrial fibrillation on Xarelto  Mild associated diastolic dysfunction in pst but not on echo  Feb 2023  LBP with L2 compression frcture status post kyphoplasty 1//24.- dec 2023 after viral inection during cruise nov 2023   12/15/2022 -   Chief Complaint  Patient presents with   Follow-up    F/up on PFT and blood work results   Type of visit: Video Virtual Visit Identification of patient SHALAYLA SWARTWOUT with 17-Dec-1940 and MRN [id number] - 2 person identifier Risks: Risks, benefits, limitations of telephone visit explained. Patient understood and verbalized agreement to proceed Anyone else on call: just patien Patient location: her home This provider location: [address], [address]; [place]; [place] [address]. [facility]. [phone]    HPI HAILA HINIKER 82 y.o. -returns for follow-up on this video visit.  This video visit is to discuss some of the evaluation we did for chronic cough.  I noticed that her cat dander is slightly positive but she tells me that she has no cats at all and no exposure to cats recently.  Otherwise allergy panel is negative.  She did have pulmonary function test yesterday and shows continued stability.  Overall in terms of her symptoms she tells me she stable she is continuing her medications.  She says the cough is slightly better.  The frequency is much less but when it comes to severe it is the same but overall she feels improved and stable.  She feels it was the allergy season that made her  cough more.  We discussed the potential role of bronchoscopy with lavage rule out opportunistic infections which I said was low probability of finding alternative etiologies for cough.  We also discussed the option of waiting and watching.  She prefer the latter.  Will see her back in 6 months with a spirometry and DLCO.     \ PFT     Latest Ref Rng & Units 12/14/2022   10:27 AM 07/08/2022    2:57 PM 04/12/2022   10:27 AM 09/09/2021   10:46 AM 08/18/2020    2:49 PM 02/28/2020    4:16 PM 10/22/2019   11:28 AM  PFT Results  FVC-Pre L 2.57  P 2.52  2.48  2.67  2.78  2.90  2.83   FVC-Predicted Pre % 102  P 99  96  103  105  108  106   Pre FEV1/FVC % % 70  P 73  72  68  68  66  69   FEV1-Pre L 1.80  P 1.84  1.80  1.81  1.88  1.92  1.96   FEV1-Predicted Pre % 96  P 97  93  94  96  96  98   DLCO uncorrected ml/min/mmHg 11.48  P 11.51  9.26  11.33  11.74  11.88  12.46   DLCO UNC% % 62  P 61  49  60  62  63  66   DLCO corrected ml/min/mmHg 11.78  P 11.93  9.26  12.46  11.74  11.88  12.46   DLCO COR %Predicted % 63  P 64  49  66  62  63  66   DLVA Predicted % 71  P 73  57  70  65  66  73     P Preliminary result     Latest Reference Range & Units 06/15/09 15:49 12/08/09 14:33 06/08/10 14:22 12/07/10 13:41 10/07/11 13:06 11/24/11 13:13 04/02/12 10:39 09/17/12 11:36 05/20/13 02:00 10/10/13 10:26 09/29/14 11:04 06/02/16 06:00 06/22/17 11:04 09/11/17 14:12 09/19/17 14:40 11/30/17 10:06 08/14/18 16:06 02/18/19 15:33 04/22/19 09:35 07/04/19 11:05 10/22/19 12:53 06/09/20 09:59 08/20/20 10:11 02/23/21 14:08 07/23/21 14:46 11/16/21 10:22 04/18/22 10:09 05/09/22 14:07 06/20/22 10:30 06/22/22 04:59 06/26/22 04:02 07/13/22 08:49 08/16/22 07:12 10/10/22 09:48 11/24/22 10:52  Eosinophils Absolute 0.0 - 0.7 K/uL 0.4 0.1 0.2 0.2 0.2 0.3 0.1 0.2 0.5 0.3 0.3 0.3 191 0.0 0.0 0.1 0.0 0.0 0.1 0.1 0.0 74 0.1 0.0 0.0 64 97 0.0 0.2 0.4 0.4 0.3 0.2 0.1 0.1    Latest Reference Range & Units 11/24/22 10:52  WBC 4.0 - 10.5  K/uL 6.1  RBC 3.87 - 5.11 Mil/uL 3.51 (L)  Hemoglobin 12.0 - 15.0 g/dL 16.1  HCT 09.6 - 04.5 % 38.6  MCV 78.0 - 100.0 fl 110.0 (H)  MCHC 30.0 - 36.0 g/dL 40.9  RDW 81.1 - 91.4 % 14.2  Platelets 150.0 - 400.0 K/uL 220.0  Neutrophils 43.0 - 77.0 % 80.5 (H)  Lymphocytes 12.0 - 46.0 % 11.5 (L)  Monocytes Relative 3.0 - 12.0 % 5.9  Eosinophil 0.0 - 5.0 % 1.3  Basophil 0.0 - 3.0 % 0.8  NEUT# 1.4 - 7.7 K/uL 4.9  Lymphocyte # 0.7 - 4.0 K/uL 0.7  Monocyte # 0.1 - 1.0 K/uL 0.4  Eosinophils Absolute 0.0 - 0.7 K/uL 0.1  Basophils Absolute 0.0 - 0.1 K/uL 0.0  Class Description Allergens  Comment  D Pteronyssinus IgE Class 0 kU/L <0.10  D Farinae IgE Class 0 kU/L <0.10  Cat Dander IgE Class 0/I kU/L 0.12 !  Dog Dander IgE Class 0 kU/L <0.10  Penicillium Chrysogen IgE Class 0 kU/L <0.10  Cladosporium Herbarum IgE Class 0 kU/L <  0.10  Aspergillus Fumigatus IgE Class 0 kU/L <0.10  Mucor Racemosus IgE Class 0 kU/L <0.10  Alternaria Alternata IgE Class 0 kU/L <0.10  Stemphylium Herbarum IgE Class 0 kU/L <0.10  Goose Feathers IgE Class 0 kU/L <0.10  Chicken Feathers IgE Class 0 kU/L <0.10  Duck Feathers IgE Class 0 kU/L <0.10  Mouse Urine IgE Class 0 kU/L <0.10  Candida Albicans IgE Class 0 kU/L <0.10  Setomelanomma Rostrat Class 0 kU/L <0.10  Aureobasidi Pullulans IgE Class 0 kU/L <0.10  Phoma Betae IgE Class 0 kU/L <0.10  Cow Dander IgE Class 0 kU/L <0.10  (L): Data is abnormally low (H): Data is abnormally high !: Data is abnormal   has a past medical history of Atrial fibrillation (HCC), Chest pain, Depression with anxiety, GERD (gastroesophageal reflux disease), Hemochromatosis, History of syncope, Hypothyroidism, and Osteoporosis.   reports that she quit smoking about 25 years ago. Her smoking use included cigarettes. She has a 42.00 pack-year smoking history. She has never been exposed to tobacco smoke. She has never used smokeless tobacco.  Past Surgical History:  Procedure  Laterality Date   IR KYPHO LUMBAR INC FX REDUCE BONE BX UNI/BIL CANNULATION INC/IMAGING  06/30/2022   IR KYPHO LUMBAR INC FX REDUCE BONE BX UNI/BIL CANNULATION INC/IMAGING  08/16/2022   MOUTH SURGERY     SHOULDER ARTHROSCOPY Left    TOTAL ABDOMINAL HYSTERECTOMY     VARICOSE VEIN SURGERY      No Known Allergies  Immunization History  Administered Date(s) Administered   Fluad Quad(high Dose 65+) 02/18/2019, 04/21/2022   Influenza Split 03/27/2019   Influenza, High Dose Seasonal PF 08/29/2017, 04/26/2018, 04/29/2020, 05/12/2021   PFIZER(Purple Top)SARS-COV-2 Vaccination 07/22/2019, 08/12/2019, 03/11/2020   Pneumococcal Conjugate-13 06/12/2014   Pneumococcal Polysaccharide-23 06/27/2010, 04/25/2018   Tdap 03/16/2011, 07/05/2019   Zoster Recombinat (Shingrix) 04/08/2019   Zoster, Live 06/27/2010, 04/08/2019    Family History  Problem Relation Age of Onset   Stroke Mother    Heart disease Father 65   Hemochromatosis Brother    Breast cancer Neg Hx      Current Outpatient Medications:    acetaminophen (TYLENOL) 325 MG tablet, Take 650 mg by mouth every 6 (six) hours as needed for mild pain or headache., Disp: , Rfl:    apixaban (ELIQUIS) 5 MG TABS tablet, TAKE 1 TABLET(5 MG) BY MOUTH TWICE DAILY.  Due for yearly follow-up with Dr Graciela Husbands, please call office [phone] for appointment., Disp: 180 tablet, Rfl: 0   atorvastatin (LIPITOR) 10 MG tablet, Take 10 mg by mouth daily., Disp: , Rfl:    cyanocobalamin (VITAMIN B12) 500 MCG tablet, Take 500 mcg by mouth daily., Disp: , Rfl:    doxycycline (VIBRAMYCIN) 100 MG capsule, Take 100 mg by mouth 2 (two) times daily., Disp: , Rfl:    flecainide (TAMBOCOR) 100 MG tablet, Take 1 tablet (100 mg total) by mouth 2 (two) times daily., Disp: 180 tablet, Rfl: 3   levothyroxine (SYNTHROID, LEVOTHROID) 112 MCG tablet, Take 112 mcg by mouth daily., Disp: , Rfl:    methocarbamol (ROBAXIN) 500 MG tablet, Take 1 tablet (500 mg total) by mouth daily as  needed for muscle spasms., Disp: 30 tablet, Rfl: 0   metoprolol succinate (TOPROL-XL) 25 MG 24 hr tablet, TAKE 1 TABLET BY MOUTH  DAILY (Patient taking differently: Take 25 mg by mouth daily.), Disp: 90 tablet, Rfl: 2   mycophenolate (MYFORTIC) 360 MG TBEC EC tablet, TAKE 1 TABLET BY MOUTH TWICE DAILY, Disp: 180 tablet, Rfl:  3   nitroGLYCERIN (NITROGLYN) 2 % ointment, Apply 1/4 inch to fingertips three times daily as needed., Disp: 30 g, Rfl: 1   ondansetron (ZOFRAN) 4 MG tablet, Take 1 tablet (4 mg total) by mouth every 8 (eight) hours as needed for nausea or vomiting., Disp: 20 tablet, Rfl: 0   pantoprazole (PROTONIX) 20 MG tablet, Take 20 mg by mouth 2 (two) times daily., Disp: , Rfl:    predniSONE (DELTASONE) 5 MG tablet, TAKE 1 TABLET(5 MG) BY MOUTH DAILY WITH BREAKFAST, Disp: 90 tablet, Rfl: 0   SPIRIVA RESPIMAT 2.5 MCG/ACT AERS, INHALE 2 PUFFS INTO THE LUNGS DAILY, Disp: 4 g, Rfl: 10   sulfamethoxazole-trimethoprim (BACTRIM DS) 800-160 MG tablet, TAKE 1 TABLET BY MOUTH ON MONDAY, WEDNESDAY, AND FRIDAY (Patient taking differently: Take 1 tablet by mouth See admin instructions. Take one tablet by mouth on Mondays Wednesdays and Fridays per patient), Disp: 90 tablet, Rfl: 3   Vitamin D, Cholecalciferol, 25 MCG (1000 UT) TABS, Take 1 tablet by mouth daily., Disp: , Rfl:       Objective:   Vitals:   12/15/22 1100  Weight: 133 lb (60.3 kg)  Height: 5' 3.5" (1.613 m)    Estimated body mass index is 23.19 kg/m as calculated from the following:   Height as of this encounter: 5' 3.5" (1.613 m).   Weight as of this encounter: 133 lb (60.3 kg).  @WEIGHTCHANGE @  [employer]   12/15/22 1100  Weight: 133 lb (60.3 kg)     Physical Exam - VIDEO VSIT   General: No distress. Looks well O2 at rest: no Cane present: no Sitting in wheel chair: no Frail: no Obese: no Neuro: Alert and Oriented x 3. GCS 15. Speech normal Psych: Pleasant      Assessment:       ICD-10-CM   1. ILD  (interstitial lung disease) (HCC)  J84.9     2. Chronic cough  R05.3          Plan:     Patient Instructions  Post viral reactive cough and chronic cough  - noted that you suspect cough contributed to L spine fracture.  Tod bad cough continues at a very high level since January 2024.  Possibilities include postviral reactive cough versus opportunistic infection versus allergic phenomena  Plan -Do exhaled nitric oxide test today. -Do CBC with differential, RAST allergy panel 11/24/2022 -We will hold off on CT scan of the chest because most recent one was in December 2023 -Do spirometry and DLCO in the next 3-6 weeks.   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 CT dec 2023 shows mild ILD.  But cough is concerning.  Plan -Monitor pulse ox levels at home --Otherwise  cotninue  prednisone to 5 mg per day +  myfortic and bactrim at current dose  - appreciate future interest in clinical trials   Pulmonary emphysema, unspecified emphysema type (HCC)  Plan -  Continue spiriva    Followup -3 3 weeks with Dr. Marchelle Gearing 15-minute video visit to discuss test results and to discuss bronchoscopy with lavage if indicated  - symptom score and walk test at followup  - Call or return sooner if needed    SIGNATURE    Dr. Kalman Shan, M.D., F.C.C.P,  Pulmonary and Critical Care Medicine Staff Physician, [facility] Director - Interstitial Lung Disease  Program  Pulmonary Fibrosis [facility] at [facility] [place], [place], [address]  Pager: [phone], If no answer or between  15:00h - 7:00h: call [phone] Telephone: [phone]  11:22 AM 12/15/2022

## 2022-12-15 NOTE — Patient Instructions (Addendum)
Post viral reactive cough and chronic cough  - noted that you suspect cough contributed to L spine fracture.  Tod bad cough continues at a very high level since January 2024.  Possibilities include postviral reactive cough versus opportunistic infection versus allergic phenomena  Plan -Do exhaled nitric oxide test today. -Do CBC with differential, RAST allergy panel 11/24/2022 -We will hold off on CT scan of the chest because most recent one was in December 2023 -Do spirometry and DLCO in the next 3-6 weeks.   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 CT dec 2023 shows mild ILD.  But cough is concening though improved  Plan - discussed bronchoscopy with lavage : will hold off  - do spirometry and Dlco in  6months -Monitor pulse ox levels at home --Otherwise  cotninue  prednisone to 5 mg per day +  myfortic and bactrim at current dose  - appreciate future interest in clinical trials -will do safety labs for myfortic at next visit   Pulmonary emphysema, unspecified emphysema type (HCC)  Plan -  Continue spiriva    Followup 6 monthss with Dr. Marchelle Gearing   - symptom score and walk test at followup  - Call or return sooner if needed

## 2022-12-19 ENCOUNTER — Other Ambulatory Visit: Payer: Self-pay | Admitting: Internal Medicine

## 2022-12-19 DIAGNOSIS — I480 Paroxysmal atrial fibrillation: Secondary | ICD-10-CM

## 2022-12-19 NOTE — Telephone Encounter (Signed)
Prescription refill request for Eliquis received. Indication:afib Last office visit:needs appt Scr:0.64  4/24 Age: 82 Weight:60.3  kg  Prescription refilled

## 2023-01-02 ENCOUNTER — Other Ambulatory Visit: Payer: Self-pay | Admitting: Internal Medicine

## 2023-01-02 DIAGNOSIS — I480 Paroxysmal atrial fibrillation: Secondary | ICD-10-CM

## 2023-01-02 NOTE — Telephone Encounter (Signed)
Eliquis 5mg  refill request received. Patient is 82 years old, weight-60.3kg, Crea-0.64 on 10/10/22, Diagnosis-Afib, and last seen by Dr. Graciela Husbands on 11/17/21-Needs an Appt. Dose is appropriate based on dosing criteria.  Last refill sent 12/19/22 for 30 day supply & note was on instructions to call for an appointment.   Sent message to Schedulers regarding an appointment.

## 2023-01-03 NOTE — Telephone Encounter (Signed)
Pt now has upcoming appt scheduled for 02/08/23 with Francis Dowse, PA.  Will refill rx to get pt to upcoming appt.

## 2023-01-11 ENCOUNTER — Other Ambulatory Visit: Payer: Self-pay

## 2023-01-11 ENCOUNTER — Inpatient Hospital Stay: Payer: Medicare Other | Attending: Hematology and Oncology

## 2023-01-11 DIAGNOSIS — D500 Iron deficiency anemia secondary to blood loss (chronic): Secondary | ICD-10-CM

## 2023-01-11 LAB — CBC WITH DIFFERENTIAL (CANCER CENTER ONLY)
Abs Immature Granulocytes: 0.03 10*3/uL (ref 0.00–0.07)
Basophils Absolute: 0.1 10*3/uL (ref 0.0–0.1)
Basophils Relative: 1 %
Eosinophils Absolute: 0.2 10*3/uL (ref 0.0–0.5)
Eosinophils Relative: 2 %
HCT: 37.4 % (ref 36.0–46.0)
Hemoglobin: 12.6 g/dL (ref 12.0–15.0)
Immature Granulocytes: 0 %
Lymphocytes Relative: 11 %
Lymphs Abs: 0.9 10*3/uL (ref 0.7–4.0)
MCH: 35.9 pg — ABNORMAL HIGH (ref 26.0–34.0)
MCHC: 33.7 g/dL (ref 30.0–36.0)
MCV: 106.6 fL — ABNORMAL HIGH (ref 80.0–100.0)
Monocytes Absolute: 0.5 10*3/uL (ref 0.1–1.0)
Monocytes Relative: 7 %
Neutro Abs: 6.4 10*3/uL (ref 1.7–7.7)
Neutrophils Relative %: 79 %
Platelet Count: 202 10*3/uL (ref 150–400)
RBC: 3.51 MIL/uL — ABNORMAL LOW (ref 3.87–5.11)
RDW: 12 % (ref 11.5–15.5)
WBC Count: 8 10*3/uL (ref 4.0–10.5)
nRBC: 0 % (ref 0.0–0.2)

## 2023-01-11 LAB — CMP (CANCER CENTER ONLY)
ALT: 16 U/L (ref 0–44)
AST: 17 U/L (ref 15–41)
Albumin: 4.1 g/dL (ref 3.5–5.0)
Alkaline Phosphatase: 54 U/L (ref 38–126)
Anion gap: 6 (ref 5–15)
BUN: 16 mg/dL (ref 8–23)
CO2: 28 mmol/L (ref 22–32)
Calcium: 9.2 mg/dL (ref 8.9–10.3)
Chloride: 106 mmol/L (ref 98–111)
Creatinine: 0.68 mg/dL (ref 0.44–1.00)
GFR, Estimated: >60 mL/min (ref >60)
Glucose, Bld: 73 mg/dL (ref 70–99)
Potassium: 4.1 mmol/L (ref 3.5–5.1)
Sodium: 140 mmol/L (ref 135–145)
Total Bilirubin: 0.5 mg/dL (ref 0.3–1.2)
Total Protein: 6.3 g/dL — ABNORMAL LOW (ref 6.5–8.1)

## 2023-01-11 LAB — IRON AND IRON BINDING CAPACITY (CC-WL,HP ONLY)
Iron: 187 ug/dL — ABNORMAL HIGH (ref 28–170)
Saturation Ratios: 72 % — ABNORMAL HIGH (ref 10.4–31.8)
TIBC: 260 ug/dL (ref 250–450)
UIBC: 73 ug/dL — ABNORMAL LOW (ref 148–442)

## 2023-01-11 LAB — FERRITIN: Ferritin: 16 ng/mL (ref 11–307)

## 2023-01-12 ENCOUNTER — Other Ambulatory Visit: Payer: Self-pay | Admitting: Internal Medicine

## 2023-01-17 NOTE — Telephone Encounter (Signed)
Pt calling in to get prescription sent in for sulfamethoxazole-trimethoprim (BACTRIM DS) 800-160 MG tablet

## 2023-01-18 ENCOUNTER — Other Ambulatory Visit: Payer: Self-pay | Admitting: Internal Medicine

## 2023-01-23 ENCOUNTER — Telehealth: Payer: Self-pay | Admitting: Internal Medicine

## 2023-01-24 ENCOUNTER — Other Ambulatory Visit: Payer: Self-pay | Admitting: *Deleted

## 2023-01-24 DIAGNOSIS — I480 Paroxysmal atrial fibrillation: Secondary | ICD-10-CM

## 2023-01-24 MED ORDER — APIXABAN 5 MG PO TABS
5.0000 mg | ORAL_TABLET | Freq: Two times a day (BID) | ORAL | 0 refills | Status: DC
Start: 2023-01-24 — End: 2023-05-05

## 2023-01-24 NOTE — Telephone Encounter (Signed)
Prescription refill request for Eliquis received. Indication:AFIB Last office visit:upcoming Scr:0.68  7/24 Age: 82 Weight:60.3  kg  Prescription refilled

## 2023-01-27 MED ORDER — SULFAMETHOXAZOLE-TRIMETHOPRIM 800-160 MG PO TABS: 1.0000 | ORAL_TABLET | ORAL | 3 refills | Status: DC

## 2023-01-27 NOTE — Telephone Encounter (Signed)
Pharmacy updated.

## 2023-02-05 NOTE — Progress Notes (Unsigned)
Cardiology Office Note Date:  02/05/2023  Patient ID:  [name], [name] 1940/11/27, MRN [id number] PCP:  Sigmund Hazel, MD  Electrophysiologist:  Dr. Graciela Husbands Hematologist: Dr. Cyndie Chime Rheumatology: Dr. Corliss Skains Pulmonology: Dr. Marchelle Gearing    Chief Complaint: *** annual visit   History of Present Illness: [name] is a 82 y.o. female with history of hemachromatosis, GERD, depression, hypothyroidism, Reynaud's, and PAFib on Flecainide/Toprol.  She reported remotely having been found with Pulmonary Fibrosis, is seeing Dr. Marchelle Gearing  She is also following with Rheumatology, Dr. Corliss Skains she says 2/2 her Reynaud's and + antibody for scleroderma.  She saw Dr. Graciela Husbands 11/17/21, no symptoms of AFib, + for fatigue, no overt bleeding, + superficial bruising, continued on her Eliquis. Planned to reach out to pulm to consider sleep testing.  *** flecainide KEG, nodal blocker *** Eliquis, weight?? Dose, labs, bleeding *** symtpoms, burden     Past Medical History:  Diagnosis Date   Atrial fibrillation (HCC)    a. Flecainide therapy;  b. event monitor 4/12   Chest pain    a. GXT myoview 4/12: no isch., EF 86%;   b. 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 Cyndie Chime   History of syncope    Hypothyroidism    Osteoporosis     Past Surgical History:  Procedure Laterality Date   IR KYPHO LUMBAR INC FX REDUCE BONE BX UNI/BIL CANNULATION INC/IMAGING  06/30/2022   IR KYPHO LUMBAR INC FX REDUCE BONE BX UNI/BIL CANNULATION INC/IMAGING  08/16/2022   MOUTH SURGERY     SHOULDER ARTHROSCOPY Left    TOTAL ABDOMINAL HYSTERECTOMY     VARICOSE VEIN SURGERY      Current Outpatient Medications  Medication Sig Dispense Refill   acetaminophen (TYLENOL) 325 MG tablet Take 650 mg by mouth every 6 (six) hours as needed for mild pain or headache.      apixaban (ELIQUIS) 5 MG TABS tablet Take 1 tablet (5 mg total) by mouth 2 (two) times daily. OVERDUE for follow-up, MUST keep upcoming appt with PROVIDER for FUTURE refills. 60 tablet 0   atorvastatin (LIPITOR) 10 MG tablet Take 10 mg by mouth daily.     cyanocobalamin (VITAMIN B12) 500 MCG tablet Take 500 mcg by mouth daily.     doxycycline (VIBRAMYCIN) 100 MG capsule Take 100 mg by mouth 2 (two) times daily.     flecainide (TAMBOCOR) 100 MG tablet Take 1 tablet (100 mg total) by mouth 2 (two) times daily. Please keep scheduled appointment for future refills. Thank you. 60 tablet 0   levothyroxine (SYNTHROID, LEVOTHROID) 112 MCG tablet Take 112 mcg by mouth daily.     methocarbamol (ROBAXIN) 500 MG tablet Take 1 tablet (500 mg total) by mouth daily as needed for muscle spasms. 30 tablet 0   metoprolol succinate (TOPROL-XL) 25 MG 24 hr tablet TAKE 1 TABLET BY MOUTH  DAILY (Patient taking differently: Take 25 mg by mouth daily.) 90 tablet 2   mycophenolate (MYFORTIC) 360 MG TBEC EC tablet TAKE 1 TABLET BY MOUTH TWICE DAILY 180 tablet 3   nitroGLYCERIN (NITROGLYN) 2 % ointment Apply 1/4 inch to fingertips three times daily as needed. 30 g 1   ondansetron (ZOFRAN) 4 MG tablet Take 1 tablet (4 mg total) by mouth every 8 (eight) hours as needed for nausea or vomiting. 20 tablet 0  pantoprazole (PROTONIX) 20 MG tablet Take 20 mg by mouth 2 (two) times daily.     predniSONE (DELTASONE) 5 MG tablet TAKE 1 TABLET(5 MG) BY MOUTH DAILY WITH BREAKFAST 90 tablet 0   SPIRIVA RESPIMAT 2.5 MCG/ACT AERS INHALE 2 PUFFS INTO THE LUNGS DAILY 4 g 10   sulfamethoxazole-trimethoprim (BACTRIM DS) 800-160 MG tablet Take 1 tablet by mouth 3 (three) times a week. TAKE 1 TABLET BY MOUTH ON MONDAY, WEDNESDAY, FRIDAY 90 tablet 3   Vitamin D, Cholecalciferol, 25 MCG (1000 UT) TABS Take 1 tablet by mouth daily.     No current facility-administered medications for this visit.    Allergies:   Patient has no known allergies.    Social History:  The patient  reports that she quit smoking about 25 years ago. Her smoking use included cigarettes. She started smoking about 67 years ago. She has a 42 pack-year smoking history. She has never been exposed to tobacco smoke. She has never used smokeless tobacco. She reports current alcohol use. She reports that she does not use drugs.   Family History:  The patient's family history includes Heart disease (age of onset: 69) in her father; Hemochromatosis in her brother; Stroke in her mother.  ROS:  Please see the history of present illness.  All other systems are reviewed and otherwise negative.   PHYSICAL EXAM:  VS:  There were no vitals taken for this visit. BMI: There is no height or weight on file to calculate BMI. Well nourished, well developed, thin body habitus,  in no acute distress  HEENT: normocephalic, atraumatic  Neck: no JVD, carotid bruits or masses Cardiac: *** RRR; no significant murmurs, no rubs, or gallops Lungs:  ***CTA b/l, no wheezing, rhonchi or rales  Abd: soft, nontender MS: no deformity, age appropriate atrophy Ext: *** no edema, 2-3+ palpable pedal pulses b/l, no skin changes or cyanosis is appreciate, no cyanosis hands b/l Skin: warm and dry, no rash Neuro:  No gross deficits appreciated Psych: euthymic mood, full affect   EKG:  Done today and reviewed by myself   ***  07/13/22: TTE 1. Left ventricular ejection fraction, by estimation, is 60 to 65%. The  left ventricle has normal function. The left ventricle has no regional  wall motion abnormalities. Left ventricular diastolic parameters are  consistent with Grade II diastolic  dysfunction (pseudonormalization). The average left ventricular global  longitudinal strain is -21.3 %. The global longitudinal strain is normal.   2. Right ventricular systolic function is normal. The right ventricular  size is normal. There is normal pulmonary artery systolic pressure.   3. Left atrial size was  moderately dilated.   4. Right atrial size was mild to moderately dilated.   5. The mitral valve is grossly normal. Trivial mitral valve  regurgitation. No evidence of mitral stenosis.   6. The aortic valve is tricuspid. There is mild calcification of the  aortic valve. Aortic valve regurgitation is trivial. No aortic stenosis is  present.   7. Aortic dilatation noted. There is borderline dilatation of the  ascending aorta, measuring 39 mm.   8. The inferior vena cava is normal in size with greater than 50%  respiratory variability, suggesting right atrial pressure of 3 mmHg.   9. Evidence of atrial level shunting detected by color flow Doppler.  There is a small patent foramen ovale with predominantly left to right  shunting across the atrial septum.   Comparison(s): No significant change from prior study.  10/14/10: TTE Study Conclusions - Left ventricle: The cavity size was normal. There was mild focal   basal hypertrophy of the septum. Systolic function was normal. The   estimated ejection fraction was in the range of 55% to 65%. Wall   motion was normal; there were no regional wall motion   abnormalities. Doppler parameters are consistent with abnormal   left ventricular relaxation (grade 1 diastolic dysfunction). - Mitral valve: Calcified annulus. - Left atrium: The atrium was mildly dilated.  Recent Labs: 06/26/2022: Magnesium 1.9 10/21/2022: TSH 1.23 01/11/2023: ALT 16; BUN 16; Creatinine 0.68; Hemoglobin 12.6; Platelet Count 202; Potassium 4.1; Sodium 140  No results found for requested labs within last 365 days.   CrCl cannot be calculated (Patient's most recent lab result is older than the maximum 21 days allowed.).   Wt Readings from Last 3 Encounters:  12/15/22 133 lb (60.3 kg)  11/24/22 134 lb 12.8 oz (61.1 kg)  10/21/22 131 lb 6.4 oz (59.6 kg)     Other studies reviewed: Additional studies/records reviewed today include: summarized above  ASSESSMENT AND  PLAN:  1. Paroxysmal AFib     CHA2DS2Vasc is *** 3, on Eliquis, appropriately dosed      *** On Flecainide w/ metoprolol     *** stable intervals  Disposition: ***   Current medicines are reviewed at length with the patient today.  The patient did not have any concerns regarding medicines.  Norma Fredrickson, PA-C 02/05/2023 4:11 PM     [facility] [address] [place] [address] [phone] (office)  [phone] (fax)

## 2023-02-07 ENCOUNTER — Other Ambulatory Visit: Payer: Self-pay | Admitting: Internal Medicine

## 2023-02-08 ENCOUNTER — Encounter: Payer: Self-pay | Admitting: Physician Assistant

## 2023-02-08 ENCOUNTER — Ambulatory Visit: Payer: Medicare Other | Attending: Physician Assistant | Admitting: Physician Assistant

## 2023-02-08 VITALS — BP 120/70 | HR 63 | Ht 63.5 in | Wt 136.8 lb

## 2023-02-08 DIAGNOSIS — Z79899 Other long term (current) drug therapy: Secondary | ICD-10-CM

## 2023-02-08 DIAGNOSIS — I480 Paroxysmal atrial fibrillation: Secondary | ICD-10-CM | POA: Diagnosis not present

## 2023-02-08 DIAGNOSIS — D6869 Other thrombophilia: Secondary | ICD-10-CM | POA: Diagnosis not present

## 2023-02-08 DIAGNOSIS — R079 Chest pain, unspecified: Secondary | ICD-10-CM | POA: Diagnosis not present

## 2023-02-08 DIAGNOSIS — Z5181 Encounter for therapeutic drug level monitoring: Secondary | ICD-10-CM | POA: Diagnosis not present

## 2023-02-08 NOTE — Patient Instructions (Signed)
Medication Instructions:   Your physician recommends that you continue on your current medications as directed. Please refer to the Current Medication list given to you today.  *If you need a refill on your cardiac medications before your next appointment, please call your pharmacy*   Lab Work: NONE ORDERED  TODAY    If you have labs (blood work) drawn today and your tests are completely normal, you will receive your results only by: MyChart Message (if you have MyChart) OR A paper copy in the mail If you have any lab test that is abnormal or we need to change your treatment, we will call you to review the results.   Testing/Procedures: NONE ORDERED  TODAY     Follow-Up: At [facility], you and your health needs are our priority.  As part of our continuing mission to provide you with exceptional heart care, we have created designated Provider Care Teams.  These Care Teams include your primary Cardiologist (physician) and Advanced Practice Providers (APPs -  Physician Assistants and Nurse Practitioners) who all work together to provide you with the care you need, when you need it.  We recommend signing up for the patient portal called "MyChart".  Sign up information is provided on this After Visit Summary.  MyChart is used to connect with patients for Virtual Visits (Telemedicine).  Patients are able to view lab/test results, encounter notes, upcoming appointments, etc.  Non-urgent messages can be sent to your provider as well.   To learn more about what you can do with MyChart, go to [email or web address].    Your next appointment:   2 month(s)  Provider:   Francis Dowse, PA-C    Other Instructions

## 2023-02-09 ENCOUNTER — Telehealth: Payer: Self-pay | Admitting: Internal Medicine

## 2023-02-09 NOTE — Telephone Encounter (Signed)
Patient called answering service and needs refills for Prednisone and Spiriva. Pharmacy is [address] Patient phone number is [phone].

## 2023-02-13 NOTE — Telephone Encounter (Signed)
ATCX1 LVM for patient advising prednisone and Spiriva have been sent to pharmacy

## 2023-02-15 ENCOUNTER — Telehealth: Payer: Self-pay | Admitting: Internal Medicine

## 2023-02-15 NOTE — Telephone Encounter (Signed)
Pharmacy called in bc they did not receive the prescription for Spiriva and Prednisone

## 2023-02-19 ENCOUNTER — Other Ambulatory Visit: Payer: Self-pay | Admitting: Internal Medicine

## 2023-02-20 NOTE — Telephone Encounter (Signed)
Pharm calling stating the PT keeps calling them and is "super upset" Adv we are short staffed.

## 2023-02-23 NOTE — Telephone Encounter (Signed)
Both meds have been filled.

## 2023-03-18 ENCOUNTER — Other Ambulatory Visit: Payer: Self-pay | Admitting: Internal Medicine

## 2023-03-28 ENCOUNTER — Ambulatory Visit: Payer: Medicare Other | Admitting: Orthopaedic Surgery

## 2023-03-28 ENCOUNTER — Other Ambulatory Visit (INDEPENDENT_AMBULATORY_CARE_PROVIDER_SITE_OTHER): Payer: Medicare Other

## 2023-03-28 DIAGNOSIS — G8929 Other chronic pain: Secondary | ICD-10-CM | POA: Diagnosis not present

## 2023-03-28 DIAGNOSIS — M25562 Pain in left knee: Secondary | ICD-10-CM

## 2023-03-28 MED ORDER — LIDOCAINE HCL 1 % IJ SOLN
2.0000 mL | INTRAMUSCULAR | Status: AC | PRN
Start: 2023-03-28 — End: 2023-03-28
  Administered 2023-03-28: 2 mL

## 2023-03-28 MED ORDER — BUPIVACAINE HCL 0.5 % IJ SOLN
2.0000 mL | INTRAMUSCULAR | Status: AC | PRN
Start: 2023-03-28 — End: 2023-03-28
  Administered 2023-03-28: 2 mL via INTRA_ARTICULAR

## 2023-03-28 MED ORDER — METHYLPREDNISOLONE ACETATE 40 MG/ML IJ SUSP
40.0000 mg | INTRAMUSCULAR | Status: AC | PRN
Start: 2023-03-28 — End: 2023-03-28
  Administered 2023-03-28: 40 mg via INTRA_ARTICULAR

## 2023-03-28 NOTE — Progress Notes (Signed)
Office Visit Note   Patient: [name]           Date of Birth: [birth year]           MRN: [id number] Visit Date: 03/28/2023              Requested by: Sigmund Hazel, MD [address] [place],  [place] [address] PCP: Sigmund Hazel, MD   Assessment & Plan: Visit Diagnoses:  1. Chronic pain of left knee     Plan: Shenitra is a 82 year old female with left knee osteoarthritis flare.  Treatment options were explained and we elected to do a cortisone injection today.  Also gave her a pamphlet for [facility].  Follow-up as needed.  Follow-Up Instructions: No follow-ups on file.   Orders:  Orders Placed This Encounter  Procedures   XR KNEE 3 VIEW LEFT   No orders of the defined types were placed in this encounter.     Procedures: Large Joint Inj: L knee on 03/28/2023 3:02 PM Details: 22 G needle Medications: 2 mL bupivacaine 0.5 %; 2 mL lidocaine 1 %; 40 mg methylPREDNISolone acetate 40 MG/ML Outcome: tolerated well, no immediate complications Patient was prepped and draped in the usual sterile fashion.       Clinical Data: No additional findings.   Subjective: Chief Complaint  Patient presents with   Left Knee - Pain    HPI [name] is a 82 year old female who comes in for recent onset of left knee pain for about 2 to 3 weeks.  She has been experiencing weakness and giving way.  I saw her last year for unrelated problems.  Review of Systems  Constitutional: Negative.   HENT: Negative.    Eyes: Negative.   Respiratory: Negative.    Cardiovascular: Negative.   Endocrine: Negative.   Musculoskeletal: Negative.   Neurological: Negative.   Hematological: Negative.   Psychiatric/Behavioral: Negative.    All other systems reviewed and are negative.    Objective: Vital Signs: There were no vitals taken for this visit.  Physical Exam Vitals and nursing note reviewed.  Constitutional:      Appearance: She is well-developed.  HENT:     Head:  Normocephalic and atraumatic.  Pulmonary:     Effort: Pulmonary effort is normal.  Abdominal:     Palpations: Abdomen is soft.  Musculoskeletal:     Cervical back: Neck supple.  Skin:    General: Skin is warm.     Capillary Refill: Capillary refill takes less than 2 seconds.  Neurological:     Mental Status: She is alert and oriented to person, place, and time.  Psychiatric:        Behavior: Behavior normal.        Thought Content: Thought content normal.        Judgment: Judgment normal.     Ortho Exam Exam of the left knee shows slight lateral joint line tenderness.  No joint effusion.  Collaterals and cruciates are stable.  Normal range of motion. Specialty Comments:  May need valium pre-ESI  EXAM: MRI LUMBAR SPINE WITHOUT CONTRAST   TECHNIQUE: Multiplanar, multisequence MR imaging of the lumbar spine was performed. No intravenous contrast was administered.   COMPARISON:  MR lumbar 05/01/2015; X-ray lumbar 10/05/2021.   FINDINGS: Segmentation: Standard; the lowest formed disc space is designated L5-S1.   Alignment: There is mild levocurvature centered at L2. Trace anterolisthesis of L4 on L5 is unchanged. There is no other antero or retrolisthesis.   Vertebrae:  There is mild compression deformity of the L3 vertebral body with up to approximately 10% loss of vertebral body height anteriorly, new since 2016, but without marrow edema to suggest acute or subacute fracture. The appearance is likely unchanged compared to the radiographs from 01/22/2019. The other vertebral body heights are preserved. Background marrow signal is normal. There is a small intraosseous hemangioma in the right L5 vertebral body. There is no suspicious marrow signal abnormality or marrow edema.   Conus medullaris and cauda equina: Conus extends to the L1 level. Conus and cauda equina appear normal.   Paraspinal and other soft tissues: Unremarkable.   Disc levels:   There is mild  multilevel disc desiccation and narrowing, overall progressed since 2016. There is multilevel facet arthropathy, most advanced at L4-L5 and worse on the right at this level.   T12-L1: No significant spinal canal or neural foraminal stenosis   L1-L2: There is a mild disc bulge eccentric to the left and mild facet arthropathy resulting in mild left and no significant right neural foraminal stenosis and no significant spinal canal stenosis, not significantly changed.   L2-L3: There is a mild disc bulge and mild bilateral facet arthropathy resulting in mild right and no significant left neural foraminal stenosis without significant spinal canal stenosis minimally progressed since 2016.   L3-L4: There is a mild disc bulge and mild bilateral facet arthropathy resulting in mild narrowing of the left subarticular zone without evidence of frank nerve root impingement and mild left and no significant right neural foraminal stenosis slightly progressed since 2016.   L4-L5: There is a mild disc bulge and mild bilateral facet arthropathy with a trace right effusion without significant spinal canal or neural foraminal stenosis. The facet arthropathy has slightly progressed since 2016   L5-S1: There is a broad-based left subarticular/foraminal protrusion and mild bilateral facet arthropathy. The protrusion abuts but does not appear to impinge the traversing left S1 nerve root. There is mild left and no significant right neural foraminal stenosis. Findings are slightly progressed since 2016.   IMPRESSION: 1. Mild anterior compression deformity of the L3 vertebral body is new since 2016 but without marrow edema to suggest acute fracture, similar in appearance to the radiographs from 2020. Otherwise, no evidence of acute injury in the lumbar spine. 2. Multilevel facet arthropathy, most advanced at L4-L5 and worse on the right, slightly progressed since 2016. 3. Otherwise, overall mild  multilevel degenerative changes as detailed above have slightly progressed since 2016, but with no high-grade spinal canal or neural foraminal stenosis, and no evidence of nerve root impingement to explain the patient's right-sided pain. 4. Mild levoscoliosis centered at L2.     Electronically Signed By: Lesia Hausen M.D. On: 10/11/2021 10:04  Imaging: No results found.   PMFS History: Patient Active Problem List   Diagnosis Date Noted   Malnutrition of moderate degree 06/25/2022   Closed compression fracture of L2 lumbar vertebra, initial encounter (HCC) 06/21/2022   Intractable back pain 06/20/2022   High risk medication use 02/18/2019   Therapeutic drug monitoring 02/18/2019   Healthcare maintenance 02/18/2019   ILD (interstitial lung disease) (HCC) 11/28/2016   Multiple lung nodules on CT 11/28/2016   Abnormal laboratory test 11/18/2016   DDD L spine 11/18/2016   Scl-70 antibody positive 11/10/2016   History of hypothyroidism 11/10/2016   History of hemochromatosis 11/10/2016   Pulmonary emphysema (HCC) 08/31/2016   [name] phenomenon without gangrene 08/31/2016   Sinusitis, chronic 08/31/2016   Chronic cough 02/10/2016  Irritable larynx 02/10/2016   Stopped smoking with greater than 40 pack year history 02/10/2016   Chest pain, unspecified 10/06/2010   Shortness of breath 10/06/2010   Hypothyroidism    Hereditary hemochromatosis (HCC) 04/22/2010   ATRIAL FIBRILLATION 10/20/2008   PAC 10/20/2008   SYNCOPE AND COLLAPSE 10/20/2008   Past Medical History:  Diagnosis Date   Atrial fibrillation (HCC)    a. Flecainide therapy;  b. event monitor 4/12   Chest pain    a. GXT myoview 4/12: no isch., EF 86%;   b. 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 Cyndie Chime   History of syncope    Hypothyroidism    Osteoporosis      Family History  Problem Relation Age of Onset   Stroke Mother    Heart disease Father 57   Hemochromatosis Brother    Breast cancer Neg Hx     Past Surgical History:  Procedure Laterality Date   IR KYPHO LUMBAR INC FX REDUCE BONE BX UNI/BIL CANNULATION INC/IMAGING  06/30/2022   IR KYPHO LUMBAR INC FX REDUCE BONE BX UNI/BIL CANNULATION INC/IMAGING  08/16/2022   MOUTH SURGERY     SHOULDER ARTHROSCOPY Left    TOTAL ABDOMINAL HYSTERECTOMY     VARICOSE VEIN SURGERY     Social History   Occupational History   Occupation: RETIRED    Employer: RETIRED  Tobacco Use   Smoking status: Former    Current packs/day: 0.00    Average packs/day: 1 pack/day for 42.0 years (42.0 ttl pk-yrs)    Types: Cigarettes    Start date: 06/28/1955    Quit date: 06/27/1997    Years since quitting: 25.7    Passive exposure: Never   Smokeless tobacco: Never  Vaping Use   Vaping status: Never Used  Substance and Sexual Activity   Alcohol use: Yes    Comment: occ   Drug use: No   Sexual activity: Not on file

## 2023-04-11 NOTE — Progress Notes (Signed)
Office Visit Note  Patient: [name]             Date of Birth: 05-06-1941           MRN: [id number]             PCP: Sigmund Hazel, MD Referring: Sigmund Hazel, MD Visit Date: 04/24/2023 Occupation: @GUAROCC @  Subjective:  Pain in both thumbs  History of Present Illness: [name] is a 82 y.o. female with scleroderma, ILD, osteoarthritis and osteoporosis.  She has not noticed any increased tightness.  She denies any increased shortness of breath.  She denies history of dysphagia.  She has been having some discomfort in her thumbs intermittently.  She has not noticed any joint swelling or any other joint discomfort. Patient was evaluated by Dr. Marchelle Gearing in May 2024.  Based on the chart review her PFTs were stable and CT scan showed mild ILD.  She continues to be on Myfortic and Bactrim DS.  She is also on prednisone 5 mg p.o. daily.  She was evaluated by the cardiologist for atrial fibrillation but no new recommendations were made.  Dr. Leonides Schanz saw her on April 12, 2023.  She was advised not to resume iron as her ferritin was 44.  She had phlebotomy in the past for hemochromatosis.  Activities of Daily Living:  Patient reports morning stiffness for 5-10 minutes.   Patient Reports nocturnal pain.  Difficulty dressing/grooming: Denies Difficulty climbing stairs: Denies Difficulty getting out of chair: Denies Difficulty using hands for taps, buttons, cutlery, and/or writing: Denies  Review of Systems  Constitutional:  Positive for fatigue.  HENT:  Positive for mouth dryness. Negative for mouth sores.   Eyes:  Negative for dryness.  Respiratory:  Negative for shortness of breath.   Cardiovascular:  Negative for chest pain and palpitations.  Gastrointestinal:  Negative for blood in stool, constipation and diarrhea.  Endocrine: Negative for increased urination.  Genitourinary:  Negative for involuntary urination.  Musculoskeletal:  Positive for joint pain, joint pain and  morning stiffness. Negative for gait problem, joint swelling, myalgias, muscle weakness, muscle tenderness and myalgias.  Skin:  Positive for sensitivity to sunlight. Negative for color change, rash and hair loss.  Allergic/Immunologic: Negative for susceptible to infections.  Neurological:  Negative for dizziness and headaches.  Hematological:  Negative for swollen glands.  Psychiatric/Behavioral:  Positive for sleep disturbance. Negative for depressed mood. The patient is not nervous/anxious.     PMFS History:  Patient Active Problem List   Diagnosis Date Noted   Malnutrition of moderate degree 06/25/2022   Closed compression fracture of L2 lumbar vertebra, initial encounter (HCC) 06/21/2022   Intractable back pain 06/20/2022   High risk medication use 02/18/2019   Therapeutic drug monitoring 02/18/2019   Healthcare maintenance 02/18/2019   ILD (interstitial lung disease) (HCC) 11/28/2016   Multiple lung nodules on CT 11/28/2016   Abnormal laboratory test 11/18/2016   DDD L spine 11/18/2016   Scl-70 antibody positive 11/10/2016   History of hypothyroidism 11/10/2016   History of hemochromatosis 11/10/2016   Pulmonary emphysema (HCC) 08/31/2016   [name] phenomenon without gangrene 08/31/2016   Sinusitis, chronic 08/31/2016   Chronic cough 02/10/2016   Irritable larynx 02/10/2016   Stopped smoking with greater than 40 pack year history 02/10/2016   Chest pain, unspecified 10/06/2010   Shortness of breath 10/06/2010   Hypothyroidism    Hereditary hemochromatosis (HCC) 04/22/2010   ATRIAL FIBRILLATION 10/20/2008   PAC 10/20/2008   SYNCOPE AND  COLLAPSE 10/20/2008    Past Medical History:  Diagnosis Date   Atrial fibrillation (HCC)    a. Flecainide therapy;  b. event monitor 4/12   Chest pain    a. GXT myoview 4/12: no isch., EF 86%;   b. echo 4/12: EF 55-65%, grade 1 diast dysfxn, LAE   Depression with anxiety    GERD (gastroesophageal reflux disease)    Hemochromatosis     indentified by the C282Y gene mutation; Dr. Cyndie Chime   History of syncope    Hypothyroidism    Osteoporosis     Family History  Problem Relation Age of Onset   Stroke Mother    Heart disease Father 56   Hemochromatosis Brother    Breast cancer Neg Hx    Past Surgical History:  Procedure Laterality Date   IR KYPHO LUMBAR INC FX REDUCE BONE BX UNI/BIL CANNULATION INC/IMAGING  06/30/2022   IR KYPHO LUMBAR INC FX REDUCE BONE BX UNI/BIL CANNULATION INC/IMAGING  08/16/2022   MOUTH SURGERY     SHOULDER ARTHROSCOPY Left    TOTAL ABDOMINAL HYSTERECTOMY     VARICOSE VEIN SURGERY     Social History   Social History Narrative   REGULAR EXERCISE   Immunization History  Administered Date(s) Administered   Fluad Quad(high Dose 65+) 02/18/2019, 04/21/2022   Influenza Split 03/27/2019   Influenza, High Dose Seasonal PF 08/29/2017, 04/26/2018, 04/29/2020, 05/12/2021   PFIZER(Purple Top)SARS-COV-2 Vaccination 07/22/2019, 08/12/2019, 03/11/2020   Pneumococcal Conjugate-13 06/12/2014   Pneumococcal Polysaccharide-23 06/27/2010, 04/25/2018   Tdap 03/16/2011, 07/05/2019   Zoster Recombinant(Shingrix) 04/08/2019   Zoster, Live 06/27/2010, 04/08/2019     Objective: Vital Signs: BP 138/77 (BP Location: Left Arm, Patient Position: Sitting, Cuff Size: Normal)   Pulse (!) 57   Resp 13   Ht 5' 3.5" (1.613 m)   Wt 136 lb 9.6 oz (62 kg)   BMI 23.82 kg/m    Physical Exam Vitals and nursing note reviewed.  Constitutional:      Appearance: She is well-developed.  HENT:     Head: Normocephalic and atraumatic.  Eyes:     Conjunctiva/sclera: Conjunctivae normal.  Cardiovascular:     Rate and Rhythm: Normal rate and regular rhythm.     Heart sounds: Normal heart sounds.  Pulmonary:     Effort: Pulmonary effort is normal.     Breath sounds: Normal breath sounds.  Abdominal:     General: Bowel sounds are normal.     Palpations: Abdomen is soft.  Musculoskeletal:     Cervical back: Normal  range of motion.  Lymphadenopathy:     Cervical: No cervical adenopathy.  Skin:    General: Skin is warm and dry.     Capillary Refill: Capillary refill takes less than 2 seconds.  Neurological:     Mental Status: She is alert and oriented to person, place, and time.  Psychiatric:        Behavior: Behavior normal.      Musculoskeletal Exam: She had limited lateral rotation of the cervical spine.  Thoracic kyphosis was noted.  She had limited range of motion of the lumbar spine.  Shoulder joints, elbow joints, wrist joints, MCPs PIPs and DIPs with good range of motion without any synovitis.  She had bilateral CMC PIP and DIP thickening.  PIP joints and knee joints in good range of motion.  There was no tenderness over ankles or MTPs.  CDAI Exam: CDAI Score: -- Patient Global: --; Provider Global: -- Swollen: --; Tender: -- Joint  Exam 04/24/2023   No joint exam has been documented for this visit   There is currently no information documented on the homunculus. Go to the Rheumatology activity and complete the homunculus joint exam.  Investigation: No additional findings.  Imaging: XR KNEE 3 VIEW LEFT  Result Date: 03/28/2023 X-rays of the left knee show moderate tricompartmental osteoarthritis with joint space narrowing and periarticular spurring.   Recent Labs: Lab Results  Component Value Date   WBC 6.9 04/20/2023   HGB 13.0 04/20/2023   PLT 193 04/20/2023   NA 139 04/20/2023   K 4.2 04/20/2023   CL 103 04/20/2023   CO2 30 04/20/2023   GLUCOSE 105 (H) 04/20/2023   BUN 13 04/20/2023   CREATININE 0.81 04/20/2023   BILITOT 0.9 04/20/2023   ALKPHOS 50 04/20/2023   AST 19 04/20/2023   ALT 18 04/20/2023   PROT 6.6 04/20/2023   ALBUMIN 4.3 04/20/2023   CALCIUM 9.5 04/20/2023   GFRAA 101 06/09/2020   QFTBGOLDPLUS NEGATIVE 08/10/2017    Speciality Comments: Reclast IV August 23, 2022  Procedures:  No procedures performed Allergies: Patient has no known allergies.    Assessment / Plan:     Visit Diagnoses: Scleroderma (HCC) - nailbed capillary changes, anticardiolipin antibody+, Scl-70+, ILD: Patient had no sclerodactyly or nailbed capillary changes on the examination today.  Anticardiolipin antibody positive - she has low titer positive anticardiolipin antibodies.  October 21, 2022 anticardiolipin IgA was positive, beta-2 GP 1 IgG was positive.  No history of thrombotic events.  Complements were normal and his sed rate was normal.  Urine protein creatinine ratio was normal.  ILD (interstitial lung disease) (HCC) - by Dr. Marchelle Gearing.High-resolution chest CT updated on 08/24/2021 revealed minimal lower lung predominant subpleural reticular opacities-no evidence of progression.  Patient was recently seen by Dr. Colletta Maryland.  PFTs have been stable.  High risk medication use - Myfortic 360 mg 1 tablet by mouth twice daily, Bactrim DS on Mondays, Wednesdays, and Fridays and prednisone 5 mg 1 tablet by mouth daily by Dr. Marchelle Gearing.  Labs are monitored by Dr. Marchelle Gearing.  April 20, 2023 CBC and CMP were normal.  Long term systemic steroid user - prednisone 5 mg p.o. daily for ILD by Dr. Marchelle Gearing.  Primary osteoarthritis of both hands-she has bilateral CMC PIP and DIP thickening.  She continues to have some discomfort in her thumb.  Joint protection was discussed.  Trochanteric bursitis of both hips-improved.  Spondylosis of lumbar spine-she continues to have some lower back discomfort.  Age-related osteoporosis without current pathological fracture - 05/02/2022 T-score -2.9, BMD 0.528 left femoral neck, no comparison available. DEXA  11/04/2019 T score -2.9 left femoral neck,BMD 0.532.  She had Reclast IV infusion on August 23, 2022 prescribed by Dr. Hyacinth Meeker.  Use of calcium rich diet and vitamin D was discussed.  Medication monitoring encounter - Reclast IV infusion by her PCP on August 23, 2022.  Other closed fracture of lumbar vertebra, L1, L2 sequela - L2  compression fracture December 2023, L1 compression fracture 2024 s/p kyphoplasty x 2.  Multiple lung nodules on CT - followed by Dr. Marchelle Gearing.  History of atrial fibrillation - followed by Dr. Graciela Husbands  History of emphysema [facility])  Former smoker  Vitamin D deficiency  Low ferritin  History of hemochromatosis - Followed by Dr. Leonides Schanz  History of hypothyroidism  Orders: No orders of the defined types were placed in this encounter.  No orders of the defined types were placed in this encounter.  Follow-Up Instructions: Return in about 6 months (around 10/23/2023) for Scleroderma, Osteoporosis, Osteoarthritis.   Pollyann Savoy, MD  Note - This record has been created using Animal nutritionist.  Chart creation errors have been sought, but may not always  have been located. Such creation errors do not reflect on  the standard of medical care.

## 2023-04-12 ENCOUNTER — Telehealth: Payer: Self-pay | Admitting: Hematology and Oncology

## 2023-04-12 ENCOUNTER — Inpatient Hospital Stay: Payer: Medicare Other | Admitting: Hematology and Oncology

## 2023-04-12 ENCOUNTER — Inpatient Hospital Stay: Payer: Medicare Other

## 2023-04-12 NOTE — Progress Notes (Signed)
[facility] [phone]   [phone]  PROGRESS NOTE  Patient Care Team: Sigmund Hazel, MD as PCP - General (Family Medicine) Duke Salvia, MD as PCP - Cardiology (Cardiology) Duke Salvia, MD as Consulting Physician (Cardiology) Sharrell Ku, MD as Consulting Physician (Gastroenterology) Jaci Standard, MD as Consulting Physician (Hematology and Oncology)  Hematological/Oncological History # Iron Deficiency Anemia  # Hereditary Hemochromatosis C282Y Homozygous 09/11/2017: last visit with Dr. Cyndie Chime 05/09/2022: establish care with Dr. Leonides Schanz   Interval History:  [name] 82 y.o. female with medical history significant for iron deficiency anemia in setting of hereditary hemochromatosis who presents for a follow up visit. The patient's last visit was on 10/10/2022 at which time she established care. In the interim since the last visit she has continued on PO iron therapy.  On exam today [name] reports ***  She notes that she is not having any lightheadedness, dizziness, or shortness of breath.  A full 10 point ROS was otherwise negative.  MEDICAL HISTORY:  Past Medical History:  Diagnosis Date   Atrial fibrillation (HCC)    a. Flecainide therapy;  b. event monitor 4/12   Chest pain    a. GXT myoview 4/12: no isch., EF 86%;   b. 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 Cyndie Chime   History of syncope    Hypothyroidism    Osteoporosis     SURGICAL HISTORY: Past Surgical History:  Procedure Laterality Date   IR KYPHO LUMBAR INC FX REDUCE BONE BX UNI/BIL CANNULATION INC/IMAGING  06/30/2022   IR KYPHO LUMBAR INC FX REDUCE BONE BX UNI/BIL CANNULATION INC/IMAGING  08/16/2022   MOUTH SURGERY     SHOULDER ARTHROSCOPY Left    TOTAL ABDOMINAL HYSTERECTOMY     VARICOSE VEIN SURGERY       SOCIAL HISTORY: Social History   Socioeconomic History   Marital status: Married    Spouse name: Not on file   Number of children: Not on file   Years of education: Not on file   Highest education level: Not on file  Occupational History   Occupation: RETIRED    Employer: RETIRED  Tobacco Use   Smoking status: Former    Current packs/day: 0.00    Average packs/day: 1 pack/day for 42.0 years (42.0 ttl pk-yrs)    Types: Cigarettes    Start date: 06/28/1955    Quit date: 06/27/1997    Years since quitting: 25.8    Passive exposure: Never   Smokeless tobacco: Never  Vaping Use   Vaping status: Never Used  Substance and Sexual Activity   Alcohol use: Yes    Comment: occ   Drug use: No   Sexual activity: Not on file  Other Topics Concern   Not on file  Social History Narrative   REGULAR EXERCISE   Social Determinants of Health   Financial Resource Strain: Low Risk  (02/17/2022)   [facility]   Financial Resource Strain    In the past 12 months, have you experienced difficulty paying for basic needs like housing, utilities, food, transportation, or clothing: Not on file    Are there any financial concerns that limit you from seeing the doctor?: Not on file  Food Insecurity: Low Risk  (01/06/2023)   Received from [facility]  Hunger Vital Sign    Worried About Running Out of Food in the Last Year: Never true    Ran Out of Food in the Last Year: Never true  Transportation Needs: Not on file (01/06/2023)  Physical Activity: Not on file  Stress: Not on file  Social Connections: Low Risk  (02/17/2022)   [facility]   Social Connections    How often do you feel that you lack companionship?: Not on file    How often do you feel left out?: Not on file    How often to you feel isolated from others?: Not on file  Intimate Partner Violence: Not At Risk (06/20/2022)   Humiliation, Afraid, Rape, and  Kick questionnaire    Fear of Current or Ex-Partner: No    Emotionally Abused: No    Physically Abused: No    Sexually Abused: No    FAMILY HISTORY: Family History  Problem Relation Age of Onset   Stroke Mother    Heart disease Father 23   Hemochromatosis Brother    Breast cancer Neg Hx     ALLERGIES:  has No Known Allergies.  MEDICATIONS:  Current Outpatient Medications  Medication Sig Dispense Refill   acetaminophen (TYLENOL) 325 MG tablet Take 650 mg by mouth every 6 (six) hours as needed for mild pain or headache.     apixaban (ELIQUIS) 5 MG TABS tablet Take 1 tablet (5 mg total) by mouth 2 (two) times daily. OVERDUE for follow-up, MUST keep upcoming appt with PROVIDER for FUTURE refills. 60 tablet 0   atorvastatin (LIPITOR) 10 MG tablet Take 10 mg by mouth daily.     cyanocobalamin (VITAMIN B12) 500 MCG tablet Take 500 mcg by mouth daily.     flecainide (TAMBOCOR) 100 MG tablet TAKE 1 TABLET(100 MG) BY MOUTH TWICE DAILY 180 tablet 3   folic acid (FOLVITE) 1 MG tablet Take 1 mg by mouth daily.     levothyroxine (SYNTHROID, LEVOTHROID) 112 MCG tablet Take 112 mcg by mouth daily.     methocarbamol (ROBAXIN) 500 MG tablet Take 1 tablet (500 mg total) by mouth daily as needed for muscle spasms. 30 tablet 0   metoprolol succinate (TOPROL-XL) 25 MG 24 hr tablet TAKE 1 TABLET BY MOUTH  DAILY (Patient taking differently: Take 25 mg by mouth daily.) 90 tablet 2   mycophenolate (MYFORTIC) 360 MG TBEC EC tablet TAKE 1 TABLET BY MOUTH TWICE DAILY 180 tablet 3   nitroGLYCERIN (NITROGLYN) 2 % ointment Apply 1/4 inch to fingertips three times daily as needed. 30 g 1   ondansetron (ZOFRAN) 4 MG tablet Take 1 tablet (4 mg total) by mouth every 8 (eight) hours as needed for nausea or vomiting. 20 tablet 0   pantoprazole (PROTONIX) 20 MG tablet Take 20 mg by mouth 2 (two) times daily.     predniSONE (DELTASONE) 5 MG tablet TAKE 1 TABLET(5 MG) BY MOUTH DAILY WITH BREAKFAST 90 tablet 1   sertraline  (ZOLOFT) 25 MG tablet Take 25 mg by mouth daily.     SPIRIVA RESPIMAT 2.5 MCG/ACT AERS INHALE 2 PUFFS INTO THE LUNGS DAILY 4 g 11   sulfamethoxazole-trimethoprim (BACTRIM DS) 800-160 MG tablet Take 1 tablet by mouth 3 (three) times a week. TAKE 1 TABLET BY MOUTH ON MONDAY, WEDNESDAY, FRIDAY 90 tablet 3   Vitamin D, Cholecalciferol, 25 MCG (1000 UT) TABS Take 1 tablet by mouth daily.     No current facility-administered medications for this visit.  REVIEW OF SYSTEMS:   Constitutional: ( - ) fevers, ( - )  chills , ( - ) night sweats Eyes: ( - ) blurriness of vision, ( - ) double vision, ( - ) watery eyes Ears, nose, mouth, throat, and face: ( - ) mucositis, ( - ) sore throat Respiratory: ( - ) cough, ( - ) dyspnea, ( - ) wheezes Cardiovascular: ( - ) palpitation, ( - ) chest discomfort, ( - ) lower extremity swelling Gastrointestinal:  ( - ) nausea, ( - ) heartburn, ( - ) change in bowel habits Skin: ( - ) abnormal skin rashes Lymphatics: ( - ) new lymphadenopathy, ( - ) easy bruising Neurological: ( - ) numbness, ( - ) tingling, ( - ) new weaknesses Behavioral/Psych: ( - ) mood change, ( - ) new changes  All other systems were reviewed with the patient and are negative.  PHYSICAL EXAMINATION:  There were no vitals filed for this visit.   There were no vitals filed for this visit.    GENERAL: Well-appearing elderly Caucasian female, alert, no distress and comfortable SKIN: skin color, texture, turgor are normal, no rashes or significant lesions EYES: conjunctiva are pink and non-injected, sclera clear LUNGS: clear to auscultation and percussion with normal breathing effort HEART: regular rate & rhythm and no murmurs and no lower extremity edema Musculoskeletal: no cyanosis of digits and no clubbing  PSYCH: alert & oriented x 3, fluent speech NEURO: no focal motor/sensory deficits  LABORATORY DATA:  I have reviewed the data as listed    Latest Ref Rng & Units 01/11/2023     9:38 AM 11/24/2022   10:52 AM 10/10/2022    9:48 AM  CBC  WBC 4.0 - 10.5 K/uL 8.0  6.1  6.4   Hemoglobin 12.0 - 15.0 g/dL 16.1  09.6  04.5   Hematocrit 36.0 - 46.0 % 37.4  38.6  38.3   Platelets 150 - 400 K/uL 202  220.0  210        Latest Ref Rng & Units 01/11/2023    9:38 AM 10/21/2022   11:49 AM 10/10/2022    9:48 AM  CMP  Glucose 70 - 99 mg/dL 73   87   BUN 8 - 23 mg/dL 16   14   Creatinine 4.09 - 1.00 mg/dL 8.11   9.14   Sodium 782 - 145 mmol/L 140   138   Potassium 3.5 - 5.1 mmol/L 4.1   3.9   Chloride 98 - 111 mmol/L 106   103   CO2 22 - 32 mmol/L 28   29   Calcium 8.9 - 10.3 mg/dL 9.2   9.5   Total Protein 6.5 - 8.1 g/dL 6.3  6.8  6.6   Total Bilirubin 0.3 - 1.2 mg/dL 0.5   0.5   Alkaline Phos 38 - 126 U/L 54   54   AST 15 - 41 U/L 17   16   ALT 0 - 44 U/L 16   13    . RADIOGRAPHIC STUDIES: XR KNEE 3 VIEW LEFT  Result Date: 03/28/2023 X-rays of the left knee show moderate tricompartmental osteoarthritis with joint space narrowing and periarticular spurring.   ASSESSMENT & PLAN [name] 82 y.o. female with medical history significant for iron deficiency anemia in setting of hereditary hemochromatosis who presents for a follow up visit.  After review of the labs, review of the records, and discussion with the patient the patients findings are most consistent  with iron deficiency anemia complicated by hereditary hemochromatosis.   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  She has not undergone phlebotomy since 2012. -- full nutritional work-up show low iron levels.  -- Due to anemia in the setting of hemochromatosis we will need to replete her with PO therapy gently. --labs today show white blood cell *** Ferritin is 44 with an iron saturation of 88%. --RTC in 3 months time with repeat labs in 6 weeks.   No orders of the defined types  were placed in this encounter.   All questions were answered. The patient knows to call the clinic with any problems, questions or concerns.  A total of more than 30 minutes were spent on this encounter with face-to-face time and non-face-to-face time, including preparing to see the patient, ordering tests and/or medications, counseling the patient and coordination of care as outlined above.   Ulysees Barns, MD Department of Hematology/Oncology [facility] Phone: [phone] Pager: [phone] Email: [email or web address]  04/12/2023 7:42 AM

## 2023-04-15 NOTE — Progress Notes (Unsigned)
Electrophysiology Office Note:   Date:  04/17/2023  ID:  [name], DOB April 26, 1941, MRN [id number]  Primary Cardiologist: Sherryl Manges, MD Electrophysiologist: None      History of Present Illness:   [name] is a 82 y.o. female with h/o AF, PAC's, emphysema, ILD, pulmonary nodules, GERD, hypothyroidism, [name], SCL-70 antibody positive seen today for routine electrophysiology followup.   Last EP Clinic visit 01/2023 notable for good rhythm control, pt overall doing well. EKG with PR interval with some "creeping out" with interval of .   Since last being seen in our clinic the patient reports doing overall well.  Her granddaughter is getting married and she decided she wanted to lose a few lbs.  She previously walked on the treadmill up to 5 miles per day.  She started back walking up to 2.5 miles and did it two days in a row.  The third day she developed intermittent vertigo. Then the 4th day she had an episode of getting hot and flushed while doing laundry.  She confirmed AF on her Apple Watch with rates of 98-167 that were intermittent and brief. She took an extra Toprol and it settled out.  She is satisfied with her overall AF control and only has infrequent "blips" of AF that spontaneously resolve. She remains active in her church and caring for her 49 y/o grandson.   She denies chest pain, dyspnea, PND, orthopnea, nausea, vomiting, syncope, edema, weight gain, or early satiety.   Review of systems complete and found to be negative unless listed in HPI.   EP Information / Studies Reviewed:    EKG is ordered today. Personal review as below.  EKG Interpretation Date/Time:  Monday April 17 2023 11:34:38 EDT Ventricular Rate:  62 PR Interval:  198 QRS Duration:  84 QT Interval:  454 QTC Calculation: 460 R Axis:   -43  Text Interpretation: Normal sinus rhythm Left axis deviation LOW VOLTAGE Confirmed by Canary Brim ([id number]) on 04/17/2023 11:39:53 AM    Studies:  ECHO 06/2022 > LVEF 60-65%, GII DD, LA moderately dilated, RA mild-mod dilated   Arrhythmia / AAD Paroxsymal AF  Flecainide (on since at least 2003) + Toprol   Risk Assessment/Calculations:    CHA2DS2-VASc Score = 3   This indicates a 3.2% annual risk of stroke. The patient's score is based upon: CHF History: 0 HTN History: 0 Diabetes History: 0 Stroke History: 0 Vascular Disease History: 0 Age Score: 2 Gender Score: 1             Physical Exam:   VS:  BP 110/72   Pulse 62   Ht 5' 3.5" (1.613 m)   Wt 135 lb 6.4 oz (61.4 kg)   SpO2 94%   BMI 23.61 kg/m    Wt Readings from Last 3 Encounters:  04/17/23 135 lb 6.4 oz (61.4 kg)  02/08/23 136 lb 12.8 oz (62.1 kg)  12/15/22 133 lb (60.3 kg)     GEN: Well nourished, well developed in no acute distress NECK: No JVD; No carotid bruits CARDIAC: Regular rate and rhythm, no murmurs, rubs, gallops RESPIRATORY:  Clear to auscultation without rales, wheezing or rhonchi  ABDOMEN: Soft, non-tender, non-distended EXTREMITIES:  No edema; No deformity   ASSESSMENT AND PLAN:    Paroxysmal AF CHA2DS2-VASc 3 / 3.2% annual risk of CVA. Most recent EKG with PR interval of in 01/2023  -continue flecainide & toprol  -EKG with stable intervals    Secondary Hypercoagulable State  -continue  Eliquis, dose reviewed & appropriate by wt / Cr    HLD  -per primary   Follow up with Dr. Graciela Husbands or EP APP in 6 months  Signed, Canary Brim, MSN, APRN, NP-C, AGACNP-BC [facility] - Electrophysiology  04/17/2023, 12:23 PM

## 2023-04-17 ENCOUNTER — Encounter: Payer: Self-pay | Admitting: Pulmonary Disease

## 2023-04-17 ENCOUNTER — Ambulatory Visit: Payer: Medicare Other | Attending: Physician Assistant | Admitting: Pulmonary Disease

## 2023-04-17 VITALS — BP 110/72 | HR 62 | Ht 63.5 in | Wt 135.4 lb

## 2023-04-17 DIAGNOSIS — Z79899 Other long term (current) drug therapy: Secondary | ICD-10-CM | POA: Diagnosis not present

## 2023-04-17 DIAGNOSIS — D6869 Other thrombophilia: Secondary | ICD-10-CM

## 2023-04-17 DIAGNOSIS — I480 Paroxysmal atrial fibrillation: Secondary | ICD-10-CM

## 2023-04-17 DIAGNOSIS — Z5181 Encounter for therapeutic drug level monitoring: Secondary | ICD-10-CM

## 2023-04-17 NOTE — Patient Instructions (Signed)
Medication Instructions:   Your physician recommends that you continue on your current medications as directed. Please refer to the Current Medication list given to you today.   *If you need a refill on your cardiac medications before your next appointment, please call your pharmacy*   Lab Work:  None ordered.  If you have labs (blood work) drawn today and your tests are completely normal, you will receive your results only by: MyChart Message (if you have MyChart) OR A paper copy in the mail If you have any lab test that is abnormal or we need to change your treatment, we will call you to review the results.   Testing/Procedures:  None ordered.   Follow-Up: At [facility], you and your health needs are our priority.  As part of our continuing mission to provide you with exceptional heart care, we have created designated Provider Care Teams.  These Care Teams include your primary Cardiologist (physician) and Advanced Practice Providers (APPs -  Physician Assistants and Nurse Practitioners) who all work together to provide you with the care you need, when you need it.  We recommend signing up for the patient portal called "MyChart".  Sign up information is provided on this After Visit Summary.  MyChart is used to connect with patients for Virtual Visits (Telemedicine).  Patients are able to view lab/test results, encounter notes, upcoming appointments, etc.  Non-urgent messages can be sent to your provider as well.   To learn more about what you can do with MyChart, go to [email or web address].    Your next appointment:   6 month(s)  Provider:   Sherryl Manges, MD    Other Instructions  Your physician wants you to follow-up in: 6 months.  You will receive a reminder letter in the mail two months in advance. If you don't receive a letter, please call our office to schedule the follow-up appointment.

## 2023-04-20 ENCOUNTER — Inpatient Hospital Stay: Payer: Medicare Other | Attending: Hematology and Oncology

## 2023-04-20 ENCOUNTER — Inpatient Hospital Stay: Payer: Medicare Other | Admitting: Hematology and Oncology

## 2023-04-20 ENCOUNTER — Other Ambulatory Visit: Payer: Self-pay

## 2023-04-20 VITALS — BP 130/77 | HR 56 | Temp 97.8°F | Resp 16 | Wt 136.2 lb

## 2023-04-20 DIAGNOSIS — D509 Iron deficiency anemia, unspecified: Secondary | ICD-10-CM | POA: Diagnosis present

## 2023-04-20 DIAGNOSIS — D500 Iron deficiency anemia secondary to blood loss (chronic): Secondary | ICD-10-CM

## 2023-04-20 LAB — CBC WITH DIFFERENTIAL (CANCER CENTER ONLY)
Abs Immature Granulocytes: 0.04 10*3/uL (ref 0.00–0.07)
Basophils Absolute: 0.1 10*3/uL (ref 0.0–0.1)
Basophils Relative: 1 %
Eosinophils Absolute: 0 10*3/uL (ref 0.0–0.5)
Eosinophils Relative: 1 %
HCT: 37.5 % (ref 36.0–46.0)
Hemoglobin: 13 g/dL (ref 12.0–15.0)
Immature Granulocytes: 1 %
Lymphocytes Relative: 12 %
Lymphs Abs: 0.8 10*3/uL (ref 0.7–4.0)
MCH: 36.3 pg — ABNORMAL HIGH (ref 26.0–34.0)
MCHC: 34.7 g/dL (ref 30.0–36.0)
MCV: 104.7 fL — ABNORMAL HIGH (ref 80.0–100.0)
Monocytes Absolute: 0.4 10*3/uL (ref 0.1–1.0)
Monocytes Relative: 6 %
Neutro Abs: 5.5 10*3/uL (ref 1.7–7.7)
Neutrophils Relative %: 79 %
Platelet Count: 193 10*3/uL (ref 150–400)
RBC: 3.58 MIL/uL — ABNORMAL LOW (ref 3.87–5.11)
RDW: 13.1 % (ref 11.5–15.5)
WBC Count: 6.9 10*3/uL (ref 4.0–10.5)
nRBC: 0 % (ref 0.0–0.2)

## 2023-04-20 LAB — CMP (CANCER CENTER ONLY)
ALT: 18 U/L (ref 0–44)
AST: 19 U/L (ref 15–41)
Albumin: 4.3 g/dL (ref 3.5–5.0)
Alkaline Phosphatase: 50 U/L (ref 38–126)
Anion gap: 6 (ref 5–15)
BUN: 13 mg/dL (ref 8–23)
CO2: 30 mmol/L (ref 22–32)
Calcium: 9.5 mg/dL (ref 8.9–10.3)
Chloride: 103 mmol/L (ref 98–111)
Creatinine: 0.81 mg/dL (ref 0.44–1.00)
GFR, Estimated: >60 mL/min (ref >60)
Glucose, Bld: 105 mg/dL — ABNORMAL HIGH (ref 70–99)
Potassium: 4.2 mmol/L (ref 3.5–5.1)
Sodium: 139 mmol/L (ref 135–145)
Total Bilirubin: 0.9 mg/dL (ref 0.3–1.2)
Total Protein: 6.6 g/dL (ref 6.5–8.1)

## 2023-04-20 LAB — IRON AND IRON BINDING CAPACITY (CC-WL,HP ONLY)
Iron: 261 ug/dL — ABNORMAL HIGH (ref 28–170)
Saturation Ratios: 91 % — ABNORMAL HIGH (ref 10.4–31.8)
TIBC: 287 ug/dL (ref 250–450)
UIBC: 26 ug/dL — ABNORMAL LOW (ref 148–442)

## 2023-04-20 NOTE — Progress Notes (Signed)
[facility] [phone]   [phone]  PROGRESS NOTE  Patient Care Team: Sigmund Hazel, MD as PCP - General (Family Medicine) Duke Salvia, MD as PCP - Cardiology (Cardiology) Duke Salvia, MD as Consulting Physician (Cardiology) Sharrell Ku, MD as Consulting Physician (Gastroenterology) Jaci Standard, MD as Consulting Physician (Hematology and Oncology)  Hematological/Oncological History # Iron Deficiency Anemia  # Hereditary Hemochromatosis C282Y Homozygous 09/11/2017: last visit with Dr. Cyndie Chime 05/09/2022: establish care with Dr. Leonides Schanz   Interval History:  [name] 82 y.o. female with medical history significant for iron deficiency anemia in setting of hereditary hemochromatosis who presents for a follow up visit. The patient's last visit was on 10/10/2022 at which time she established care. In the interim since the last visit she has continued on PO iron therapy.  On exam today [name] reports she has been well overall and interim since her last visit though she did have an episode of vertigo.  She reports that she continues to follow with her cardiologist and also did recently have an atrial fibrillation episode.  She reports her energy levels are good and currently an 8 out of 10.  Her appetite has been strong.  She is not having any shortness of breath or headache.  She denies any nausea or vomiting but does have chronic issues with diarrhea.  She reports that she is not having any runny nose, sore throat, or cough.  She has been having some occasional issues with allergies over the summer.  Nothing else has been out of the ordinary.  She has not been taking any iron pills as we instructed previously.  She notes that she is not having any lightheadedness, dizziness, or shortness of breath.  A full 10 point ROS was otherwise negative.  MEDICAL HISTORY:  Past Medical History:  Diagnosis Date   Atrial fibrillation (HCC)     a. Flecainide therapy;  b. event monitor 4/12   Chest pain    a. GXT myoview 4/12: no isch., EF 86%;   b. 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 Cyndie Chime   History of syncope    Hypothyroidism    Osteoporosis     SURGICAL HISTORY: Past Surgical History:  Procedure Laterality Date   IR KYPHO LUMBAR INC FX REDUCE BONE BX UNI/BIL CANNULATION INC/IMAGING  06/30/2022   IR KYPHO LUMBAR INC FX REDUCE BONE BX UNI/BIL CANNULATION INC/IMAGING  08/16/2022   MOUTH SURGERY     SHOULDER ARTHROSCOPY Left    TOTAL ABDOMINAL HYSTERECTOMY     VARICOSE VEIN SURGERY      SOCIAL HISTORY: Social History   Socioeconomic History   Marital status: Married    Spouse name: Not on file   Number of children: Not on file   Years of education: Not on file   Highest education level: Not on file  Occupational History   Occupation: RETIRED    Employer: RETIRED  Tobacco Use   Smoking status: Former    Current packs/day: 0.00    Average packs/day: 1 pack/day for 42.0 years (42.0 ttl pk-yrs)    Types: Cigarettes    Start date: 06/28/1955    Quit date: 06/27/1997    Years since quitting: 25.8    Passive exposure: Never   Smokeless tobacco: Never  Vaping Use  Vaping status: Never Used  Substance and Sexual Activity   Alcohol use: Yes    Comment: occ   Drug use: No   Sexual activity: Not on file  Other Topics Concern   Not on file  Social History Narrative   REGULAR EXERCISE   Social Determinants of Health   Financial Resource Strain: Low Risk  (02/17/2022)   [facility]   Financial Resource Strain    In the past 12 months, have you experienced difficulty paying for basic needs like housing, utilities, food, transportation, or clothing: Not on file    Are there any financial concerns that limit you from seeing the  doctor?: Not on file  Food Insecurity: Low Risk  (01/06/2023)   Received from [facility]   Hunger Vital Sign    Worried About Running Out of Food in the Last Year: Never true    Ran Out of Food in the Last Year: Never true  Transportation Needs: Not on file (01/06/2023)  Physical Activity: Not on file  Stress: Not on file  Social Connections: Low Risk  (02/17/2022)   [facility]   Social Connections    How often do you feel that you lack companionship?: Not on file    How often do you feel left out?: Not on file    How often to you feel isolated from others?: Not on file  Intimate Partner Violence: Not At Risk (06/20/2022)   Humiliation, Afraid, Rape, and Kick questionnaire    Fear of Current or Ex-Partner: No    Emotionally Abused: No    Physically Abused: No    Sexually Abused: No    FAMILY HISTORY: Family History  Problem Relation Age of Onset   Stroke Mother    Heart disease Father 72   Hemochromatosis Brother    Breast cancer Neg Hx     ALLERGIES:  has No Known Allergies.  MEDICATIONS:  Current Outpatient Medications  Medication Sig Dispense Refill   acetaminophen (TYLENOL) 325 MG tablet Take 650 mg by mouth every 6 (six) hours as needed for mild pain or headache.     apixaban (ELIQUIS) 5 MG TABS tablet Take 1 tablet (5 mg total) by mouth 2 (two) times daily. OVERDUE for follow-up, MUST keep upcoming appt with PROVIDER for FUTURE refills. 60 tablet 0   atorvastatin (LIPITOR) 10 MG tablet Take 10 mg by mouth daily.     cyanocobalamin (VITAMIN B12) 500 MCG tablet Take 500 mcg by mouth daily.     flecainide (TAMBOCOR) 100 MG tablet TAKE 1 TABLET(100 MG) BY MOUTH TWICE DAILY 180 tablet 3   folic acid (FOLVITE) 1 MG tablet Take 1 mg by mouth daily.     levothyroxine (SYNTHROID, LEVOTHROID) 112 MCG tablet Take 112 mcg by mouth daily.     methocarbamol (ROBAXIN) 500 MG tablet Take 1 tablet (500 mg total) by mouth daily as needed for  muscle spasms. 30 tablet 0   metoprolol succinate (TOPROL-XL) 25 MG 24 hr tablet TAKE 1 TABLET BY MOUTH  DAILY (Patient taking differently: Take 25 mg by mouth daily.) 90 tablet 2   mycophenolate (MYFORTIC) 360 MG TBEC EC tablet TAKE 1 TABLET BY MOUTH TWICE DAILY 180 tablet 3   ondansetron (ZOFRAN) 4 MG tablet Take 1 tablet (4 mg total) by mouth every 8 (eight) hours as needed for nausea or vomiting. 20 tablet 0   pantoprazole (PROTONIX) 20 MG tablet Take 20 mg by mouth 2 (  two) times daily.     predniSONE (DELTASONE) 5 MG tablet TAKE 1 TABLET(5 MG) BY MOUTH DAILY WITH BREAKFAST 90 tablet 1   sertraline (ZOLOFT) 25 MG tablet Take 25 mg by mouth daily.     SPIRIVA RESPIMAT 2.5 MCG/ACT AERS INHALE 2 PUFFS INTO THE LUNGS DAILY 4 g 11   sulfamethoxazole-trimethoprim (BACTRIM DS) 800-160 MG tablet Take 1 tablet by mouth 3 (three) times a week. TAKE 1 TABLET BY MOUTH ON MONDAY, WEDNESDAY, FRIDAY 90 tablet 3   Vitamin D, Cholecalciferol, 25 MCG (1000 UT) TABS Take 1 tablet by mouth daily.     No current facility-administered medications for this visit.    REVIEW OF SYSTEMS:   Constitutional: ( - ) fevers, ( - )  chills , ( - ) night sweats Eyes: ( - ) blurriness of vision, ( - ) double vision, ( - ) watery eyes Ears, nose, mouth, throat, and face: ( - ) mucositis, ( - ) sore throat Respiratory: ( - ) cough, ( - ) dyspnea, ( - ) wheezes Cardiovascular: ( - ) palpitation, ( - ) chest discomfort, ( - ) lower extremity swelling Gastrointestinal:  ( - ) nausea, ( - ) heartburn, ( - ) change in bowel habits Skin: ( - ) abnormal skin rashes Lymphatics: ( - ) new lymphadenopathy, ( - ) easy bruising Neurological: ( - ) numbness, ( - ) tingling, ( - ) new weaknesses Behavioral/Psych: ( - ) mood change, ( - ) new changes  All other systems were reviewed with the patient and are negative.  PHYSICAL EXAMINATION:  Vitals:   04/20/23 1515  BP: 130/77  Pulse: (!) 56  Resp: 16  Temp: 97.8 F (36.6 C)   SpO2: 95%   Filed Weights   04/20/23 1515  Weight: 136 lb 3.2 oz (61.8 kg)    GENERAL: Well-appearing elderly Caucasian female, alert, no distress and comfortable SKIN: skin color, texture, turgor are normal, no rashes or significant lesions EYES: conjunctiva are pink and non-injected, sclera clear LUNGS: clear to auscultation and percussion with normal breathing effort HEART: regular rate & rhythm and no murmurs and no lower extremity edema Musculoskeletal: no cyanosis of digits and no clubbing  PSYCH: alert & oriented x 3, fluent speech NEURO: no focal motor/sensory deficits  LABORATORY DATA:  I have reviewed the data as listed    Latest Ref Rng & Units 04/20/2023    3:01 PM 01/11/2023    9:38 AM 11/24/2022   10:52 AM  CBC  WBC 4.0 - 10.5 K/uL 6.9  8.0  6.1   Hemoglobin 12.0 - 15.0 g/dL 82.9  56.2  13.0   Hematocrit 36.0 - 46.0 % 37.5  37.4  38.6   Platelets 150 - 400 K/uL 193  202  220.0        Latest Ref Rng & Units 04/20/2023    3:01 PM 01/11/2023    9:38 AM 10/21/2022   11:49 AM  CMP  Glucose 70 - 99 mg/dL 865  73    BUN 8 - 23 mg/dL 13  16    Creatinine 7.84 - 1.00 mg/dL 6.96  2.95    Sodium 284 - 145 mmol/L 139  140    Potassium 3.5 - 5.1 mmol/L 4.2  4.1    Chloride 98 - 111 mmol/L 103  106    CO2 22 - 32 mmol/L 30  28    Calcium 8.9 - 10.3 mg/dL 9.5  9.2    Total Protein  6.5 - 8.1 g/dL 6.6  6.3  6.8   Total Bilirubin 0.3 - 1.2 mg/dL 0.9  0.5    Alkaline Phos 38 - 126 U/L 50  54    AST 15 - 41 U/L 19  17    ALT 0 - 44 U/L 18  16     . RADIOGRAPHIC STUDIES: XR KNEE 3 VIEW LEFT  Result Date: 03/28/2023 X-rays of the left knee show moderate tricompartmental osteoarthritis with joint space narrowing and periarticular spurring.   ASSESSMENT & PLAN SHADAY KNICELEY 82 y.o. female with medical history significant for iron deficiency anemia in setting of hereditary hemochromatosis who presents for a follow up visit.  After review of the labs, review of the  records, and discussion with the patient the patients findings are most consistent with iron deficiency anemia complicated by hereditary hemochromatosis.   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  She has not undergone phlebotomy since 2012. -- full nutritional work-up show low iron levels.  -- Due to anemia in the setting of hemochromatosis we will need to replete her with PO therapy gently. Hgb has normalized, currently holding PO iron --labs today show white blood cell 6.9, Hgb 13.0, MCV 104.7, Plt 193. Ferritin and iron studies are pending. Last ferritin 01/11/2023 at 16.  --RTC in 6 months time, can extend to yearly at next visit if everything is stable.  No orders of the defined types were placed in this encounter.   All questions were answered. The patient knows to call the clinic with any problems, questions or concerns.  A total of more than 25 minutes were spent on this encounter with face-to-face time and non-face-to-face time, including preparing to see the patient, ordering tests and/or medications, counseling the patient and coordination of care as outlined above.   Ulysees Barns, MD Department of Hematology/Oncology [facility] at [facility] Phone: [phone] Pager: [phone] Email: [email or web address]  04/20/2023 4:07 PM

## 2023-04-21 LAB — FERRITIN: Ferritin: 24 ng/mL (ref 11–307)

## 2023-04-24 ENCOUNTER — Encounter: Payer: Self-pay | Admitting: Rheumatology

## 2023-04-24 ENCOUNTER — Ambulatory Visit: Payer: Medicare Other | Attending: Rheumatology | Admitting: Rheumatology

## 2023-04-24 VITALS — BP 138/77 | HR 57 | Resp 13 | Ht 63.5 in | Wt 136.6 lb

## 2023-04-24 DIAGNOSIS — Z8639 Personal history of other endocrine, nutritional and metabolic disease: Secondary | ICD-10-CM

## 2023-04-24 DIAGNOSIS — M7062 Trochanteric bursitis, left hip: Secondary | ICD-10-CM

## 2023-04-24 DIAGNOSIS — J439 Emphysema, unspecified: Secondary | ICD-10-CM

## 2023-04-24 DIAGNOSIS — R790 Abnormal level of blood mineral: Secondary | ICD-10-CM

## 2023-04-24 DIAGNOSIS — Z87891 Personal history of nicotine dependence: Secondary | ICD-10-CM

## 2023-04-24 DIAGNOSIS — M19041 Primary osteoarthritis, right hand: Secondary | ICD-10-CM

## 2023-04-24 DIAGNOSIS — M349 Systemic sclerosis, unspecified: Secondary | ICD-10-CM

## 2023-04-24 DIAGNOSIS — R918 Other nonspecific abnormal finding of lung field: Secondary | ICD-10-CM

## 2023-04-24 DIAGNOSIS — J849 Interstitial pulmonary disease, unspecified: Secondary | ICD-10-CM | POA: Diagnosis not present

## 2023-04-24 DIAGNOSIS — M47816 Spondylosis without myelopathy or radiculopathy, lumbar region: Secondary | ICD-10-CM

## 2023-04-24 DIAGNOSIS — Z7952 Long term (current) use of systemic steroids: Secondary | ICD-10-CM

## 2023-04-24 DIAGNOSIS — M810 Age-related osteoporosis without current pathological fracture: Secondary | ICD-10-CM

## 2023-04-24 DIAGNOSIS — M19042 Primary osteoarthritis, left hand: Secondary | ICD-10-CM

## 2023-04-24 DIAGNOSIS — Z79899 Other long term (current) drug therapy: Secondary | ICD-10-CM | POA: Diagnosis not present

## 2023-04-24 DIAGNOSIS — R760 Raised antibody titer: Secondary | ICD-10-CM

## 2023-04-24 DIAGNOSIS — Z5181 Encounter for therapeutic drug level monitoring: Secondary | ICD-10-CM

## 2023-04-24 DIAGNOSIS — M7061 Trochanteric bursitis, right hip: Secondary | ICD-10-CM

## 2023-04-24 DIAGNOSIS — Z8679 Personal history of other diseases of the circulatory system: Secondary | ICD-10-CM

## 2023-04-24 DIAGNOSIS — E559 Vitamin D deficiency, unspecified: Secondary | ICD-10-CM

## 2023-04-24 DIAGNOSIS — S32008S Other fracture of unspecified lumbar vertebra, sequela: Secondary | ICD-10-CM

## 2023-05-05 ENCOUNTER — Other Ambulatory Visit: Payer: Self-pay | Admitting: Internal Medicine

## 2023-05-05 DIAGNOSIS — I480 Paroxysmal atrial fibrillation: Secondary | ICD-10-CM

## 2023-05-05 NOTE — Telephone Encounter (Signed)
Eliquis 5mg  refill request received. Patient is 82 years old, weight-62kg, Crea-0.81 on 04/20/23, Diagnosis-Afib, and last seen by Ferdie Ping on 04/17/23. Dose is appropriate based on dosing criteria. Will send in refill to requested pharmacy.

## 2023-06-05 ENCOUNTER — Telehealth: Payer: Self-pay | Admitting: Internal Medicine

## 2023-06-05 ENCOUNTER — Encounter: Payer: Self-pay | Admitting: *Deleted

## 2023-06-05 MED ORDER — PREDNISONE 5 MG PO TABS: 5.0000 mg | ORAL_TABLET | Freq: Every day | ORAL | 1 refills | Status: DC

## 2023-06-05 NOTE — Telephone Encounter (Signed)
Called [facility] and let them know that we sent over a prescription for her prednisone 5 mg, 90 day supply with a refill.  The pharmacy tech stated that he did not see the script in his system.  Advised I would send the script again and let the patient know.  ATC patient, no answer.  LVM and sent mychart message.

## 2023-06-05 NOTE — Telephone Encounter (Signed)
Patient responded to [employer].  Nothing further needed.

## 2023-06-05 NOTE — Telephone Encounter (Signed)
PT calling about Pred refill. Pharm asked her to call us to prompt the refill due to no reply from [name].Is it poss to get every 60-90 days and add on at least 1-2 refill for her.She is out right now.   [facility] [address]

## 2023-06-13 ENCOUNTER — Ambulatory Visit: Payer: Medicare Other | Admitting: Physician Assistant

## 2023-06-25 ENCOUNTER — Emergency Department (HOSPITAL_BASED_OUTPATIENT_CLINIC_OR_DEPARTMENT_OTHER): Payer: Medicare Other

## 2023-06-25 ENCOUNTER — Other Ambulatory Visit: Payer: Self-pay

## 2023-06-25 ENCOUNTER — Emergency Department (HOSPITAL_BASED_OUTPATIENT_CLINIC_OR_DEPARTMENT_OTHER)
Admission: EM | Admit: 2023-06-25 | Discharge: 2023-06-25 | Disposition: A | Discharge status: Home / Self Care | Payer: Medicare Other

## 2023-06-25 ENCOUNTER — Encounter (HOSPITAL_BASED_OUTPATIENT_CLINIC_OR_DEPARTMENT_OTHER): Payer: Self-pay | Admitting: Emergency Medicine

## 2023-06-25 DIAGNOSIS — S81812A Laceration without foreign body, left lower leg, initial encounter: Secondary | ICD-10-CM | POA: Insufficient documentation

## 2023-06-25 DIAGNOSIS — Y92009 Unspecified place in unspecified non-institutional (private) residence as the place of occurrence of the external cause: Secondary | ICD-10-CM | POA: Insufficient documentation

## 2023-06-25 DIAGNOSIS — Z79899 Other long term (current) drug therapy: Secondary | ICD-10-CM | POA: Insufficient documentation

## 2023-06-25 DIAGNOSIS — Z7901 Long term (current) use of anticoagulants: Secondary | ICD-10-CM | POA: Diagnosis not present

## 2023-06-25 DIAGNOSIS — E039 Hypothyroidism, unspecified: Secondary | ICD-10-CM | POA: Insufficient documentation

## 2023-06-25 DIAGNOSIS — S0990XA Unspecified injury of head, initial encounter: Secondary | ICD-10-CM

## 2023-06-25 DIAGNOSIS — W010XXA Fall on same level from slipping, tripping and stumbling without subsequent striking against object, initial encounter: Secondary | ICD-10-CM | POA: Diagnosis not present

## 2023-06-25 DIAGNOSIS — S0101XA Laceration without foreign body of scalp, initial encounter: Secondary | ICD-10-CM | POA: Insufficient documentation

## 2023-06-25 DIAGNOSIS — S8992XA Unspecified injury of left lower leg, initial encounter: Secondary | ICD-10-CM | POA: Diagnosis present

## 2023-06-25 MED ORDER — HYDROCODONE-ACETAMINOPHEN 5-325 MG PO TABS: 1.0000 | ORAL_TABLET | Freq: Four times a day (QID) | ORAL | 0 refills | Status: DC | PRN

## 2023-06-25 MED ORDER — HYDROCODONE-ACETAMINOPHEN 5-325 MG PO TABS
1.0000 | ORAL_TABLET | Freq: Once | ORAL | Status: AC
  Administered 2023-06-25: 1 via ORAL
  Filled 2023-06-25: qty 1

## 2023-06-25 NOTE — ED Triage Notes (Signed)
Tripped up steps Larey Seat hit left leg  And hit head near right eye On eliquis Tetanus UTD

## 2023-06-25 NOTE — ED Notes (Signed)
Dc instructions reviewed with patient. Patient voiced understanding. Dc with belongings.  °

## 2023-06-25 NOTE — Discharge Instructions (Signed)
The base layer of the dressing is called Mepitel and may be left in place for 14 days.  The 2nd layer is a nonadherent dressing, change that every 2 to 3 days.  If needed ,you may gently clean with normal saline,provided to you. Keep this routine until seen by wound care.

## 2023-06-25 NOTE — ED Provider Notes (Signed)
[place] EMERGENCY DEPARTMENT AT [facility] Provider Note   CSN: [id number] Arrival date & time: 06/25/23  1502     History  Chief Complaint  Patient presents with   Marletta Lor    [name] is a 82 y.o. female.  82 year old female with past medical history of atrial fibrillation on Eliquis and hypothyroidism presenting to the emergency department today with headache and left leg pain after she fell at home.  The patient states she tripped walking up the stairs.  She has a skin tear on her left leg that is burning.  She was able to ambulate on this but states that there is a burning pain here.  She also hit her head.  She went to urgent care and had her wounds dressed and was sent to the ER for further evaluation.  The patient reports that she has had a tetanus shot in the last year.  She denies any other injuries.  Denies any chest pain or shortness of breath.  She does not have any pain in her back.  She denies any pain over her knee or ankle.  She has been able to ambulate since this occurred.  She is reporting a throbbing frontal headache.   Fall Associated symptoms include headaches.       Home Medications Prior to Admission medications   Medication Sig Start Date End Date Taking? Authorizing Provider  HYDROcodone-acetaminophen (NORCO/VICODIN) 5-325 MG tablet Take 1 tablet by mouth every 6 (six) hours as needed. 06/25/23  Yes Durwin Glaze, MD  acetaminophen (TYLENOL) 325 MG tablet Take 650 mg by mouth every 6 (six) hours as needed for mild pain or headache.    [provider]  apixaban (ELIQUIS) 5 MG TABS tablet TAKE 1 TABLET BY MOUTH TWICE DAILY 05/05/23   Duke Salvia, MD  atorvastatin (LIPITOR) 10 MG tablet Take 10 mg by mouth daily. 06/18/19   [provider]  cyanocobalamin (VITAMIN B12) 500 MCG tablet Take 500 mcg by mouth daily.    [provider]  flecainide (TAMBOCOR) 100 MG tablet TAKE 1 TABLET(100 MG) BY MOUTH TWICE DAILY  03/21/23   Sheilah Pigeon, PA-C  folic acid (FOLVITE) 1 MG tablet Take 1 mg by mouth daily.    [provider]  levothyroxine (SYNTHROID, LEVOTHROID) 112 MCG tablet Take 112 mcg by mouth daily.    [provider]  methocarbamol (ROBAXIN) 500 MG tablet Take 1 tablet (500 mg total) by mouth daily as needed for muscle spasms. 10/21/22   Pollyann Savoy, MD  metoprolol succinate (TOPROL-XL) 25 MG 24 hr tablet TAKE 1 TABLET BY MOUTH  DAILY Patient taking differently: Take 25 mg by mouth daily. 05/29/17   Duke Salvia, MD  mycophenolate (MYFORTIC) 360 MG TBEC EC tablet TAKE 1 TABLET BY MOUTH TWICE DAILY 06/22/22   Kalman Shan, MD  ondansetron (ZOFRAN) 4 MG tablet Take 1 tablet (4 mg total) by mouth every 8 (eight) hours as needed for nausea or vomiting. 07/25/22   Kalman Shan, MD  pantoprazole (PROTONIX) 20 MG tablet Take 20 mg by mouth 2 (two) times daily. 11/10/21   [provider]  predniSONE (DELTASONE) 5 MG tablet Take 1 tablet (5 mg total) by mouth daily with breakfast. 06/05/23   Kalman Shan, MD  sertraline (ZOLOFT) 25 MG tablet Take 25 mg by mouth daily.    [provider]  SPIRIVA RESPIMAT 2.5 MCG/ACT AERS INHALE 2 PUFFS INTO THE LUNGS DAILY 02/23/23   Kalman Shan,  MD  sulfamethoxazole-trimethoprim (BACTRIM DS) 800-160 MG tablet Take 1 tablet by mouth 3 (three) times a week. TAKE 1 TABLET BY MOUTH ON [place], FRIDAY 01/27/23   Kalman Shan, MD  Vitamin D, Cholecalciferol, 25 MCG (1000 UT) TABS Take 1 tablet by mouth daily.    [provider]      Allergies    Patient has no known allergies.    Review of Systems   Review of Systems  Skin:  Positive for wound.  Neurological:  Positive for headaches.  All other systems reviewed and are negative.   Physical Exam Updated Vital Signs BP 113/60 (BP Location: Left Arm)   Pulse 84   Temp 98.2 F (36.8 C)   Resp 16   SpO2 96%  Physical Exam Vitals and  nursing note reviewed.  Gen: NAD Eyes: PERRL, EOMI HEENT: no oropharyngeal swelling, there is a small 0.5 cm laceration noted just above the right eyebrow Neck: trachea midline, no midline tenderness Resp: clear to auscultation bilaterally Card: RRR, no murmurs, rubs, or gallops Abd: nontender, nondistended Extremities: no calf tenderness, no edema Vascular: 2+ radial pulses bilaterally, 2+ DP pulses bilaterally Skin: There is a skin tear noted over the left anterior shin with no bony tenderness noted, there is mild oozing but this is relatively hemostatic Neuro: No focal deficits Psyc: acting appropriately   ED Results / Procedures / Treatments   Labs (all labs ordered are listed, but only abnormal results are displayed) Labs Reviewed - No data to display  EKG None  Radiology CT Cervical Spine Wo Contrast Result Date: 06/25/2023 CLINICAL DATA:  Neck trauma, intoxicated or obtunded (Age >= 16y) EXAM: CT CERVICAL SPINE WITHOUT CONTRAST TECHNIQUE: Multidetector CT imaging of the cervical spine was performed without intravenous contrast. Multiplanar CT image reconstructions were also generated. RADIATION DOSE REDUCTION: This exam was performed according to the departmental dose-optimization program which includes automated exposure control, adjustment of the mA and/or kV according to patient size and/or use of iterative reconstruction technique. COMPARISON:  09/04/2019 FINDINGS: Alignment: Facet joints are aligned without dislocation or traumatic listhesis. Dens and lateral masses are aligned. Skull base and vertebrae: No acute fracture. No primary bone lesion or focal pathologic process. Soft tissues and spinal canal: No prevertebral fluid or swelling. No visible canal hematoma. Disc levels: Facet predominant cervical spondylosis, worse on the left. Upper chest: Emphysema. Other: Bilateral carotid atherosclerosis. IMPRESSION: 1. No acute fracture or traumatic listhesis of the cervical spine.  2. Emphysema (ICD10-J43.9). Electronically Signed   By: Duanne Guess D.O.   On: 06/25/2023 16:39   CT Head Wo Contrast Result Date: 06/25/2023 CLINICAL DATA:  Head trauma, intracranial venous injury suspected EXAM: CT HEAD WITHOUT CONTRAST TECHNIQUE: Contiguous axial images were obtained from the base of the skull through the vertex without intravenous contrast. RADIATION DOSE REDUCTION: This exam was performed according to the departmental dose-optimization program which includes automated exposure control, adjustment of the mA and/or kV according to patient size and/or use of iterative reconstruction technique. COMPARISON:  09/04/2019 FINDINGS: Brain: No evidence of acute infarction, hemorrhage, hydrocephalus, extra-axial collection or mass lesion/mass effect. Patchy low-density changes within the periventricular and subcortical white matter most compatible with chronic microvascular ischemic change. Mild diffuse cerebral volume loss. Vascular: Atherosclerotic calcifications involving the large vessels of the skull base. No unexpected hyperdense vessel. Skull: Normal. Negative for fracture or focal lesion. Sinuses/Orbits: No acute finding. Other: Negative for scalp hematoma. IMPRESSION: 1. No acute intracranial findings. 2. Chronic microvascular ischemic change and  cerebral volume loss. Electronically Signed   By: Duanne Guess D.O.   On: 06/25/2023 16:35    Procedures .Laceration Repair  Date/Time: 06/25/2023 5:43 PM  Performed by: Durwin Glaze, MD Authorized by: Durwin Glaze, MD   Consent:    Consent obtained:  Verbal   Consent given by:  Patient Universal protocol:    Patient identity confirmed:  Verbally with patient Laceration details:    Length (cm):  0.5 Exploration:    Contaminated: no   Treatment:    Area cleansed with:  Saline   Amount of cleaning:  Standard Skin repair:    Repair method:  Tissue adhesive     Medications Ordered in ED Medications   HYDROcodone-acetaminophen (NORCO/VICODIN) 5-325 MG per tablet 1 tablet (1 tablet Oral Given 06/25/23 1703)    ED Course/ Medical Decision Making/ A&P                                 Medical Decision Making 82 year old female with past medical history of atrial fibrillation on Eliquis presents emergency department today with skin tear to the left anterior shin as well as a headache and small laceration noted over her left forehead.  I will further evaluate patient here with a CT scan of her head and cervical spine for evaluation for acute traumatic injuries.  She is not have any bony tenderness and appears to have the skin tear over her anterior shin.  Will have this cleaned and dressed.  This is a superficial skin tear so I do not think that sutures would be beneficial at this time.  The patient CT scan is unremarkable.  Laceration was repaired with skin glue.  She is discharged with return precautions.  Amount and/or Complexity of Data Reviewed Radiology: ordered.  Risk Prescription drug management.            Final Clinical Impression(s) / ED Diagnoses Final diagnoses:  Skin tear of left lower leg without complication, initial encounter  Closed head injury, initial encounter  Laceration of scalp, initial encounter    Rx / DC Orders ED Discharge Orders          Ordered    HYDROcodone-acetaminophen (NORCO/VICODIN) 5-325 MG tablet  Every 6 hours PRN        06/25/23 1746              Durwin Glaze, MD 06/25/23 1746

## 2023-06-30 ENCOUNTER — Other Ambulatory Visit: Payer: Self-pay

## 2023-06-30 ENCOUNTER — Emergency Department (HOSPITAL_BASED_OUTPATIENT_CLINIC_OR_DEPARTMENT_OTHER)
Admission: EM | Admit: 2023-06-30 | Discharge: 2023-06-30 | Disposition: A | Discharge status: Home / Self Care | Payer: Medicare Other | Attending: Emergency Medicine | Admitting: Emergency Medicine

## 2023-06-30 ENCOUNTER — Encounter (HOSPITAL_BASED_OUTPATIENT_CLINIC_OR_DEPARTMENT_OTHER): Payer: Self-pay | Admitting: Emergency Medicine

## 2023-06-30 DIAGNOSIS — S81802S Unspecified open wound, left lower leg, sequela: Secondary | ICD-10-CM | POA: Diagnosis not present

## 2023-06-30 DIAGNOSIS — Z7901 Long term (current) use of anticoagulants: Secondary | ICD-10-CM | POA: Insufficient documentation

## 2023-06-30 DIAGNOSIS — W19XXXD Unspecified fall, subsequent encounter: Secondary | ICD-10-CM | POA: Insufficient documentation

## 2023-06-30 DIAGNOSIS — Z4800 Encounter for change or removal of nonsurgical wound dressing: Secondary | ICD-10-CM | POA: Insufficient documentation

## 2023-06-30 DIAGNOSIS — S8992XD Unspecified injury of left lower leg, subsequent encounter: Secondary | ICD-10-CM | POA: Diagnosis present

## 2023-06-30 MED ORDER — HYDROCODONE-ACETAMINOPHEN 5-325 MG PO TABS
1.0000 | ORAL_TABLET | Freq: Once | ORAL | Status: AC
Start: 2023-06-30 — End: 2023-06-30
  Administered 2023-06-30: 1 via ORAL
  Filled 2023-06-30: qty 1

## 2023-06-30 NOTE — ED Notes (Signed)
 Dressing applied to wound. Cleansed, combat gauze, 3 in gauze and ACE pressure dressing applied. Pt sent with dressing materials and instructions given for PA.

## 2023-06-30 NOTE — ED Provider Notes (Signed)
 [place] EMERGENCY DEPARTMENT AT [facility] Provider Note   CSN: [id number] Arrival date & time: 06/30/23  1240     History  Chief Complaint  Patient presents with   Wound Check    [name] is a 83 y.o. female with a history of hereditary hemochromatosis, atrial fibrillation, and interstitial lung disease who presents to the ED today with her daughter for a wound check.  Patient reports she had a fall 6 days ago, on 12/29, and hit her left lower leg, causing a skin tear.  She was evaluated at the ED after the fall and her wound was cleaned and dressed.  Patient's daughter change the dressing on her leg today and states that patient soaked through the dressing.  She has tried to make an appointment for her to see wound care but she is not able to get in until the end of the month.  Patient denies fever, warmth to touch, redness, or swelling of the wound site.  She takes Eliquis  daily.  No other complaints or concerns at this time.    Home Medications Prior to Admission medications   Medication Sig Start Date End Date Taking? Authorizing Provider  acetaminophen  (TYLENOL ) 325 MG tablet Take 650 mg by mouth every 6 (six) hours as needed for mild pain or headache.    [provider]  apixaban  (ELIQUIS ) 5 MG TABS tablet TAKE 1 TABLET BY MOUTH TWICE DAILY 05/05/23   Fernande Elspeth BROCKS, MD  atorvastatin  (LIPITOR) 10 MG tablet Take 10 mg by mouth daily. 06/18/19   [provider]  cyanocobalamin  (VITAMIN B12) 500 MCG tablet Take 500 mcg by mouth daily.    [provider]  flecainide  (TAMBOCOR ) 100 MG tablet TAKE 1 TABLET(100 MG) BY MOUTH TWICE DAILY 03/21/23   Ursuy, Renee Lynn, PA-C  folic acid  (FOLVITE ) 1 MG tablet Take 1 mg by mouth daily.    [provider]  HYDROcodone -acetaminophen  (NORCO/VICODIN) 5-325 MG tablet Take 1 tablet by mouth every 6 (six) hours as needed. 06/25/23   Ula Prentice SAUNDERS, MD  levothyroxine  (SYNTHROID , LEVOTHROID) 112 MCG  tablet Take 112 mcg by mouth daily.    [provider]  methocarbamol  (ROBAXIN ) 500 MG tablet Take 1 tablet (500 mg total) by mouth daily as needed for muscle spasms. 10/21/22   Dolphus Reiter, MD  metoprolol  succinate (TOPROL -XL) 25 MG 24 hr tablet TAKE 1 TABLET BY MOUTH  DAILY Patient taking differently: Take 25 mg by mouth daily. 05/29/17   Fernande Elspeth BROCKS, MD  mycophenolate  (MYFORTIC ) 360 MG TBEC EC tablet TAKE 1 TABLET BY MOUTH TWICE DAILY 06/22/22   Geronimo Amel, MD  ondansetron  (ZOFRAN ) 4 MG tablet Take 1 tablet (4 mg total) by mouth every 8 (eight) hours as needed for nausea or vomiting. 07/25/22   Geronimo Amel, MD  pantoprazole  (PROTONIX ) 20 MG tablet Take 20 mg by mouth 2 (two) times daily. 11/10/21   [provider]  predniSONE  (DELTASONE ) 5 MG tablet Take 1 tablet (5 mg total) by mouth daily with breakfast. 06/05/23   Geronimo Amel, MD  sertraline (ZOLOFT) 25 MG tablet Take 25 mg by mouth daily.    [provider]  SPIRIVA  RESPIMAT 2.5 MCG/ACT AERS INHALE 2 PUFFS INTO THE LUNGS DAILY 02/23/23   Geronimo Amel, MD  sulfamethoxazole -trimethoprim  (BACTRIM  DS) 800-160 MG tablet Take 1 tablet by mouth 3 (three) times a week. TAKE 1 TABLET BY MOUTH ON MONDAY, WEDNESDAY, FRIDAY 01/27/23   Geronimo Amel, MD  Vitamin D ,  Cholecalciferol , 25 MCG (1000 UT) TABS Take 1 tablet by mouth daily.    [provider]      Allergies    Patient has no known allergies.    Review of Systems   Review of Systems  Skin:  Positive for wound.  All other systems reviewed and are negative.   Physical Exam Updated Vital Signs BP 129/64 (BP Location: Right Arm)   Pulse 60   Temp 98.6 F (37 C) (Oral)   Resp 18   SpO2 94%  Physical Exam Vitals and nursing note reviewed.  Constitutional:      Appearance: Normal appearance.  HENT:     Head: Normocephalic and atraumatic.     Mouth/Throat:     Mouth: Mucous membranes are moist.  Eyes:      Conjunctiva/sclera: Conjunctivae normal.     Pupils: Pupils are equal, round, and reactive to light.  Cardiovascular:     Rate and Rhythm: Normal rate.     Pulses: Normal pulses.  Pulmonary:     Effort: Pulmonary effort is normal.     Breath sounds: Normal breath sounds.  Musculoskeletal:        General: Swelling present. Normal range of motion.     Comments: Swelling and bruising of left ankle. Large wound on left lower shin without surrounding erythema, warmth to touch, or purulent drainage. Compartments soft. No tenderness to left calf or foot.  Skin:    General: Skin is warm and dry.     Findings: Bruising present. No rash.     Comments: No active bleeding of the skin tear. Bruising present inferior to the wound site.  Neurological:     General: No focal deficit present.     Mental Status: She is alert.     Sensory: No sensory deficit.     Motor: No weakness.  Psychiatric:        Mood and Affect: Mood normal.        Behavior: Behavior normal.     ED Results / Procedures / Treatments   Labs (all labs ordered are listed, but only abnormal results are displayed) Labs Reviewed - No data to display  EKG None  Radiology No results found.  Procedures Procedures: not indicated.   Medications Ordered in ED Medications  HYDROcodone -acetaminophen  (NORCO/VICODIN) 5-325 MG per tablet 1 tablet (has no administration in time range)    ED Course/ Medical Decision Making/ A&P                                 Medical Decision Making Risk Prescription drug management.   This patient presents to the ED for concern of wound check, this involves an extensive number of treatment options, and is a complaint that carries with it a high risk of complications and morbidity.   Differential diagnosis includes: cellulitis, MRSA, wound infection, delayed wound healing, healing wound, etc.   Comorbidities  See HPI above   Additional History  Additional history obtained from  previous ED record.   Problem List / ED Course / Critical Interventions / Medication Management  Patient is here today for wound check.  She had a fall on 12/29 was evaluated the ED.  Wound was cleaned and dressed.  Wound was bleeding this morning so daughter wanted her to come in for further evaluation as he cannot get an appointment for wound care until the end of the month.  There is no  active bleeding of the wound at the time of evaluation.  No erythema, warmth to touch, or discharge from the wound site. No suspicion of infection. I ordered medications including: Percocet for pain  Wound was irrigated in the ED. Combat guaze and pressure dressing applied. I have reviewed the patients home medicines and have made adjustments as needed. I have sent an ambulatory referral to wound care for further care.   Social Determinants of Health  Housing    Test / Admission - Considered  Patient is stable and safe for discharge home. Return precautions given.       Final Clinical Impression(s) / ED Diagnoses Final diagnoses:  Wound of left leg, sequela    Rx / DC Orders ED Discharge Orders          Ordered    Ambulatory referral to Wound Clinic        06/30/23 1736              Waddell Sluder, PA-C 06/30/23 1807    Cottie Donnice PARAS, MD 06/30/23 [phone]

## 2023-06-30 NOTE — ED Notes (Signed)
 Pt given discharge instructions. Opportunities given for questions. Pt verbalizes understanding. Jillyn Hidden, RN

## 2023-06-30 NOTE — Discharge Instructions (Addendum)
 Keep dressing on for the next 2 days.  After that you can switch the dressing if need be and clean the wound with soap and water. This wound may take several weeks to heal. Do no use hydrogen peroxide to clean the wound, it delays scabbing/wound healing.  I have sent a referral to wound care to try to get you in sooner. They should give you a call to schedule an appointment.  Get help right away if: You have a red streak that goes away from your wound. You have a fever and chills, and your symptoms suddenly get worse.

## 2023-06-30 NOTE — ED Triage Notes (Signed)
 Recent fall 06/25/23 Wound on left lower leg Reports continued bleeding and some swelling/ pain.

## 2023-07-05 ENCOUNTER — Encounter (HOSPITAL_BASED_OUTPATIENT_CLINIC_OR_DEPARTMENT_OTHER): Payer: Medicare Other | Attending: Internal Medicine | Admitting: Internal Medicine

## 2023-07-05 DIAGNOSIS — L97822 Non-pressure chronic ulcer of other part of left lower leg with fat layer exposed: Secondary | ICD-10-CM

## 2023-07-05 DIAGNOSIS — I872 Venous insufficiency (chronic) (peripheral): Secondary | ICD-10-CM | POA: Diagnosis not present

## 2023-07-05 DIAGNOSIS — I482 Chronic atrial fibrillation, unspecified: Secondary | ICD-10-CM | POA: Diagnosis not present

## 2023-07-05 DIAGNOSIS — I87312 Chronic venous hypertension (idiopathic) with ulcer of left lower extremity: Secondary | ICD-10-CM

## 2023-07-05 DIAGNOSIS — F419 Anxiety disorder, unspecified: Secondary | ICD-10-CM | POA: Diagnosis not present

## 2023-07-05 DIAGNOSIS — J849 Interstitial pulmonary disease, unspecified: Secondary | ICD-10-CM

## 2023-07-05 DIAGNOSIS — M349 Systemic sclerosis, unspecified: Secondary | ICD-10-CM | POA: Insufficient documentation

## 2023-07-05 DIAGNOSIS — Z7901 Long term (current) use of anticoagulants: Secondary | ICD-10-CM

## 2023-07-05 DIAGNOSIS — I7300 Raynaud's syndrome without gangrene: Secondary | ICD-10-CM | POA: Insufficient documentation

## 2023-07-06 ENCOUNTER — Ambulatory Visit: Payer: Medicare Other | Admitting: Physician Assistant

## 2023-07-06 ENCOUNTER — Ambulatory Visit: Payer: Medicare Other | Admitting: Internal Medicine

## 2023-07-06 ENCOUNTER — Encounter: Payer: Self-pay | Admitting: Physician Assistant

## 2023-07-06 ENCOUNTER — Ambulatory Visit (INDEPENDENT_AMBULATORY_CARE_PROVIDER_SITE_OTHER): Payer: Medicare Other | Admitting: Internal Medicine

## 2023-07-06 VITALS — BP 127/68 | HR 74 | Ht 63.5 in | Wt 136.1 lb

## 2023-07-06 DIAGNOSIS — M349 Systemic sclerosis, unspecified: Secondary | ICD-10-CM | POA: Diagnosis not present

## 2023-07-06 DIAGNOSIS — Z5181 Encounter for therapeutic drug level monitoring: Secondary | ICD-10-CM

## 2023-07-06 DIAGNOSIS — J849 Interstitial pulmonary disease, unspecified: Secondary | ICD-10-CM

## 2023-07-06 DIAGNOSIS — Z79899 Other long term (current) drug therapy: Secondary | ICD-10-CM | POA: Diagnosis not present

## 2023-07-06 DIAGNOSIS — R768 Other specified abnormal immunological findings in serum: Secondary | ICD-10-CM

## 2023-07-06 DIAGNOSIS — I7300 Raynaud's syndrome without gangrene: Secondary | ICD-10-CM

## 2023-07-06 DIAGNOSIS — J432 Centrilobular emphysema: Secondary | ICD-10-CM

## 2023-07-06 DIAGNOSIS — M1712 Unilateral primary osteoarthritis, left knee: Secondary | ICD-10-CM | POA: Diagnosis not present

## 2023-07-06 DIAGNOSIS — Z7952 Long term (current) use of systemic steroids: Secondary | ICD-10-CM

## 2023-07-06 LAB — PULMONARY FUNCTION TEST
DL/VA % pred: 66 %
DL/VA: 2.7 ml/min/mmHg/L
DLCO cor % pred: 60 %
DLCO cor: 11.05 ml/min/mmHg
DLCO unc % pred: 60 %
DLCO unc: 11.05 ml/min/mmHg
FEF 25-75 Pre: 1.11 L/s
FEF2575-%Pred-Pre: 83 %
FEV1-%Pred-Pre: 98 %
FEV1-Pre: 1.83 L
FEV1FVC-%Pred-Pre: 96 %
FEV6-%Pred-Pre: 108 %
FEV6-Pre: 2.56 L
FEV6FVC-%Pred-Pre: 105 %
FVC-%Pred-Pre: 102 %
FVC-Pre: 2.57 L
Pre FEV1/FVC ratio: 71 %
Pre FEV6/FVC Ratio: 100 %

## 2023-07-06 MED ORDER — METHYLPREDNISOLONE ACETATE 40 MG/ML IJ SUSP
40.0000 mg | INTRAMUSCULAR | Status: AC | PRN
  Administered 2023-07-06: 40 mg via INTRA_ARTICULAR

## 2023-07-06 MED ORDER — BUPIVACAINE HCL 0.25 % IJ SOLN
2.0000 mL | INTRAMUSCULAR | Status: AC | PRN
  Administered 2023-07-06: 2 mL via INTRA_ARTICULAR

## 2023-07-06 MED ORDER — LIDOCAINE HCL 1 % IJ SOLN
2.0000 mL | INTRAMUSCULAR | Status: AC | PRN
  Administered 2023-07-06: 2 mL

## 2023-07-06 MED ORDER — PREDNISONE 1 MG PO TABS: ORAL_TABLET | ORAL | 0 refills | Status: DC

## 2023-07-06 NOTE — Progress Notes (Signed)
 [name], [name] ([id number]) [id number] Page 1 of 9 Visit Report for 07/05/2023 Allergy List Details Patient Name: Date of Service: [name], PA [name]. 07/05/2023 8:00 A M Medical Record Number: [id number] Patient Account Number: [id number] Date of Birth/Sex: Treating RN: January 29, 1941 (83 y.o. [name] Primary Care Jerre Diguglielmo: Cleotilde Olam CROME Other Clinician: Referring Phillippa Straub: Treating Seymone Forlenza/Extender: Rosan Harlene Cleotilde Olam CROME Weeks in Treatment: 0 Allergies Active Allergies No Known Allergies Allergy Notes Electronic Signature(s) Signed: 07/05/2023 6:26:05 PM By: [name] RN Entered By: Scotton, Joanne on 07/05/2023 05:19:12 -------------------------------------------------------------------------------- Arrival Information Details Patient Name: Date of Service: [name], PA [name]. 07/05/2023 8:00 A M Medical Record Number: [id number] Patient Account Number: [id number] Date of Birth/Sex: Treating RN: 1940-10-18 (82 y.o. [name] Primary Care Ahmia Colford: Cleotilde Olam CROME Other Clinician: Referring Ellin Fitzgibbons: Treating Ronit Cranfield/Extender: Rosan Harlene Cleotilde Olam CROME Devra in Treatment: 0 Visit Information Patient Arrived: Ambulatory Arrival Time: 08:12 Accompanied By: daughter Transfer Assistance: None Patient Has Alerts: Yes Patient Alerts: Patient on Blood Thinner Eliquis  History Since Last Visit Added or deleted any medications: No Any new allergies or adverse reactions: No Had a fall or experienced change in activities of daily living that may affect risk of falls: No Signs or symptoms of abuse/neglect since last visito No Hospitalized since last visit: No Implantable device outside of the clinic excluding cellular tissue based products placed in the center since last visit: No Electronic Signature(s) Signed: 07/05/2023 6:26:05 PM By: [name] RN Entered By: Claven [name] on 07/05/2023 05:18:24 [name] ([id number]) [id number] Page 2 of 9 -------------------------------------------------------------------------------- Clinic Level of Care Assessment Details Patient Name: Date of Service: [name], PA [name]. 07/05/2023 8:00 A M Medical Record Number: [id number] Patient Account Number: [id number] Date of Birth/Sex: Treating RN: 1940-08-27 (83 y.o. [name] Primary Care Araiyah Cumpton: Cleotilde Olam CROME Other Clinician: Referring Phong Isenberg: Treating Dorna Mallet/Extender: Rosan Harlene Cleotilde Olam CROME Devra in Treatment: 0 Clinic Level of Care Assessment Items TOOL 2 Quantity Score X- 1 0 Use when only an EandM is performed on the INITIAL visit ASSESSMENTS - Nursing Assessment / Reassessment X- 1 20 General Physical Exam (combine w/ comprehensive assessment (listed just below) when performed on new pt. evals) X- 1 25 Comprehensive Assessment (HX, ROS, Risk Assessments, Wounds Hx, etc.) ASSESSMENTS - Wound and Skin A ssessment / Reassessment X - Simple Wound Assessment / Reassessment - one wound 1 5 []  - 0 Complex Wound Assessment / Reassessment - multiple wounds []  - 0 Dermatologic / Skin Assessment (not related to wound area) ASSESSMENTS - Ostomy and/or Continence Assessment and Care []  - 0 Incontinence Assessment and Management []  - 0 Ostomy Care Assessment and Management (repouching, etc.) PROCESS - Coordination of Care X - Simple Patient / Family Education for ongoing care 1 15 []  - 0 Complex (extensive) Patient / Family Education for ongoing care X- 1 10 Staff obtains Chiropractor, Records, T Results / Process Orders est X- 1 10 Staff telephones HHA, Nursing Homes / Clarify orders / etc []  - 0 Routine Transfer to another Facility (non-emergent condition) []  - 0 Routine Hospital Admission (non-emergent condition) []  - 0 New Admissions / Manufacturing Engineer / Ordering NPWT Apligraf, etc. , []  - 0 Emergency Hospital Admission  (emergent condition) X- 1 10 Simple Discharge Coordination []  - 0 Complex (extensive) Discharge Coordination PROCESS - Special Needs []  - 0 Pediatric / Minor Patient Management []  - 0 Isolation Patient Management []  - 0 Hearing / Language /  Visual special needs []  - 0 Assessment of Community assistance (transportation, D/C planning, etc.) []  - 0 Additional assistance / Altered mentation []  - 0 Support Surface(s) Assessment (bed, cushion, seat, etc.) INTERVENTIONS - Wound Cleansing / Measurement X- 1 5 Wound Imaging (photographs - any number of wounds) []  - 0 Wound Tracing (instead of photographs) X- 1 5 Simple Wound Measurement - one wound []  - 0 Complex Wound Measurement - multiple wounds X- 1 5 Simple Wound Cleansing - one wound []  - 0 Complex Wound Cleansing - multiple wounds INTERVENTIONS - Wound Dressings X - Small Wound Dressing one or multiple wounds 1 10 [name] ([id number]) [id number] Page 3 of 9 []  - 0 Medium Wound Dressing one or multiple wounds []  - 0 Large Wound Dressing one or multiple wounds []  - 0 Application of Medications - injection INTERVENTIONS - Miscellaneous []  - 0 External ear exam []  - 0 Specimen Collection (cultures, biopsies, blood, body fluids, etc.) []  - 0 Specimen(s) / Culture(s) sent or taken to Lab for analysis []  - 0 Patient Transfer (multiple staff / Nurse, Adult / Similar devices) []  - 0 Simple Staple / Suture removal (25 or less) []  - 0 Complex Staple / Suture removal (26 or more) []  - 0 Hypo / Hyperglycemic Management (close monitor of Blood Glucose) []  - 0 Ankle / Brachial Index (ABI) - do not check if billed separately Has the patient been seen at the hospital within the last three years: Yes Total Score: 120 Level Of Care: New/Established - Level 4 Electronic Signature(s) Signed: 07/05/2023 6:26:05 PM By: [name] RN Entered By: Claven [name] on 07/05/2023  06:46:08 -------------------------------------------------------------------------------- Encounter Discharge Information Details Patient Name: Date of Service: [name], PA [name]. 07/05/2023 8:00 A M Medical Record Number: [id number] Patient Account Number: [id number] Date of Birth/Sex: Treating RN: 1940/12/28 (83 y.o. [name] Primary Care Lashanna Angelo: Cleotilde Olam CROME Other Clinician: Referring Aspyn Warnke: Treating Jaidin Richison/Extender: Rosan Harlene Cleotilde Olam CROME Devra in Treatment: 0 Encounter Discharge Information Items Discharge Condition: Stable Ambulatory Status: Ambulatory Discharge Destination: Home Transportation: Private Auto Accompanied By: self Schedule Follow-up Appointment: Yes Clinical Summary of Care: Patient Declined Electronic Signature(s) Signed: 07/05/2023 6:26:05 PM By: [name] RN Entered By: Claven [name] on 07/05/2023 07:07:21 -------------------------------------------------------------------------------- Lower Extremity Assessment Details Patient Name: Date of Service: [name], PA [name]. 07/05/2023 8:00 A M Medical Record Number: [id number] Patient Account Number: [id number] Date of Birth/Sex: Treating RN: 12-29-1940 (83 y.o. [name] Primary Care Derek Laughter: Cleotilde Olam CROME Other Clinician: Referring Nora Sabey: Treating [place] Virrueta/Extender: Rosan Harlene Cleotilde Olam CROME Devra in Treatment: [address], [place] [name] ([id number]) [id number] Page 4 of 9 Edema Assessment Assessed: [Left: No] [Right: No] [Left: Edema] [Right: :] Calf Left: Right: Point of Measurement: 29 cm From Medial Instep 32.3 cm Ankle Left: Right: Point of Measurement: 8 cm From Medial Instep 19 cm Knee To Floor Left: Right: From Medial Instep 39 cm Vascular Assessment Pulses: Dorsalis Pedis Palpable: [Left:Yes] Posterior Tibial Palpable: [Left:Yes] Extremity colors, hair growth, and conditions: Extremity Color:  [Left:Normal] Hair Growth on Extremity: [Left:No] Temperature of Extremity: [Left:Cool] Capillary Refill: [Left:< 3 seconds] Dependent Rubor: [Left:No] Blanched when Elevated: [Left:No No] Toe Nail Assessment Left: Right: Thick: No Discolored: No Deformed: No Improper Length and Hygiene: No Electronic Signature(s) Signed: 07/05/2023 6:26:05 PM By: [name] RN Entered By: Claven [name] on 07/05/2023 05:42:12 -------------------------------------------------------------------------------- Multi Wound Chart Details Patient Name: Date of Service: [name], PA [name]. 07/05/2023 8:00 A M  Medical Record Number: [id number] Patient Account Number: [id number] Date of Birth/Sex: Treating RN: Jul 07, 1940 (83 y.o. F) Primary Care Nazir Hacker: Cleotilde Olam CROME Other Clinician: Referring Kennia Vanvorst: Treating Yarden Hillis/Extender: Rosan Harlene Cleotilde Olam CROME Devra in Treatment: 0 Vital Signs Height(in): 64 Pulse(bpm): 57 Weight(lbs): 133 Blood Pressure(mmHg): 120/74 Body Mass Index(BMI): 22.8 Temperature(F): 98.1 Respiratory Rate(breaths/min): 16 [2:Photos:] [N/A:N/A] Left, Anterior Lower Leg N/A N/A Wound Location: Hematoma N/A N/A Wounding Event: Skin T ear N/A N/A Primary Etiology: Cataracts, Chronic Obstructive N/A N/A Comorbid History: Pulmonary Disease (COPD), Arrhythmia, Raynauds, Scleroderma, Confinement Anxiety 06/25/2023 N/A N/A Date Acquired: 0 N/A N/A Weeks of Treatment: Open N/A N/A Wound Status: No N/A N/A Wound Recurrence: 5x5.6x0.1 N/A N/A Measurements L x W x D (cm) 21.991 N/A N/A A (cm) : rea 2.199 N/A N/A Volume (cm) : Full Thickness Without Exposed N/A N/A Classification: Support Structures Medium N/A N/A Exudate A mount: Serosanguineous N/A N/A Exudate Type: red, brown N/A N/A Exudate Color: Fibrotic scar, thickened scar N/A N/A Wound Margin: Large (67-100%) N/A N/A Granulation A mount: Red N/A N/A Granulation Quality: Small (1-33%) N/A  N/A Necrotic A mount: Eschar, Adherent Slough N/A N/A Necrotic Tissue: Fat Layer (Subcutaneous Tissue): Yes N/A N/A Exposed Structures: Small (1-33%) N/A N/A Epithelialization: Scarring: Yes N/A N/A Periwound Skin Texture: No Abnormalities Noted N/A N/A Periwound Skin Moisture: No Abnormalities Noted N/A N/A Periwound Skin Color: No Abnormality N/A N/A Temperature: Treatment Notes Wound #2 (Lower Leg) Wound Laterality: Left, Anterior Cleanser Soap and Water Discharge Instruction: (wash leg in clinic when leg wrappings are on) May shower and wash wound with dial antibacterial soap and water prior to dressing change. Wound Cleanser Discharge Instruction: Cleanse the wound with wound cleanser prior to applying a clean dressing using gauze sponges, not tissue or cotton balls. Peri-Wound Care Sween Lotion (Moisturizing lotion) Discharge Instruction: Apply moisturizing lotion as directed Topical Gentamicin Discharge Instruction: Apply to open wound  not the skin flap in the middle of the wound Mupirocin Ointment Discharge Instruction: Apply to open wound  not the skin flap in the middle of the wound Primary Dressing PolyMem Silver Non-Adhesive Dressing, 4.25x4.25 in Discharge Instruction: Apply to wound bed as instructed Secondary Dressing ABD Pad, 8x10 Discharge Instruction: Apply over primary dressing as directed. Secured With Compression Wrap Kerlix Roll 4.5x3.1 (in/yd) Discharge Instruction: Apply Kerlix and Coban compression as directed. Coban Self-Adherent Wrap 4x5 (in/yd) Discharge Instruction: Apply over Kerlix as directed. Compression Stockings [name], [name] ([id number]) [id number] Page 6 of 9 Add-Ons Electronic Signature(s) Signed: 07/05/2023 5:26:35 PM By: Rosan Harlene DO Entered By: Rosan Harlene on 07/05/2023 07:39:21 -------------------------------------------------------------------------------- Multi-Disciplinary  Care Plan Details Patient Name: Date of Service: [name], PA [name]. 07/05/2023 8:00 A M Medical Record Number: [id number] Patient Account Number: [id number] Date of Birth/Sex: Treating RN: Oct 23, 1940 (83 y.o. [name] Primary Care Mackenzey Crownover: Cleotilde Olam CROME Other Clinician: Referring Naquita Nappier: Treating Jashay Roddy/Extender: Rosan Harlene Cleotilde Olam CROME Devra in Treatment: 0 Active Inactive Wound/Skin Impairment Nursing Diagnoses: Impaired tissue integrity Goals: Patient/caregiver will verbalize understanding of skin care regimen Date Initiated: 07/05/2023 Target Resolution Date: 09/25/2023 Goal Status: Active Interventions: Assess ulceration(s) every visit Treatment Activities: Skin care regimen initiated : 07/05/2023 Notes: Electronic Signature(s) Signed: 07/05/2023 6:26:05 PM By: [name] RN Entered By: Claven [name] on 07/05/2023 06:07:40 -------------------------------------------------------------------------------- Pain Assessment Details Patient Name: Date of Service: [name], PA [name]. 07/05/2023 8:00 A M Medical Record Number: [id number] Patient Account Number: [id number] Date of Birth/Sex: Treating RN: 1940/08/25 (83 y.o. F)  Claven [name] Primary Care Jamin Humphries: Cleotilde Olam CROME Other Clinician: Referring Jalise Zawistowski: Treating Henley Blyth/Extender: Rosan Harlene Cleotilde Olam CROME Devra in Treatment: 0 Active Problems Location of Pain Severity and Description of Pain Patient Has Paino Yes Site Locations Pain Location: [name], [name] ([id number]) [id number] Page 7 of 9 Pain Location: Generalized Pain With Dressing Change: Yes Duration of the Pain. Constant / Intermittento Constant Rate the pain. Current Pain Level: 6 Worst Pain Level: 10 Least Pain Level: 3 Tolerable Pain Level: 3 Character of Pain Describe the Pain: Burning Pain Management and Medication Current Pain Management: Medication: Yes Cold Application:  No Rest: Yes Massage: No Activity: No T.E.N.S.: No Heat Application: No Leg drop or elevation: No Is the Current Pain Management Adequate: Adequate How does your wound impact your activities of daily livingo Sleep: No Bathing: No Appetite: No Relationship With Others: No Bladder Continence: No Emotions: No Bowel Continence: No Work: No Toileting: No Drive: No Dressing: No Hobbies: No Electronic Signature(s) Signed: 07/05/2023 6:26:05 PM By: [name] RN Entered By: Claven [name] on 07/05/2023 05:53:36 -------------------------------------------------------------------------------- Patient/Caregiver Education Details Patient Name: Date of Service: [name], PA [name]. 1/8/2025andnbsp8:00 A M Medical Record Number: [id number] Patient Account Number: [id number] Date of Birth/Gender: Treating RN: [birth year] (83 y.o. [name] Primary Care Physician: Cleotilde Olam CROME Other Clinician: Referring Physician: Treating Physician/Extender: Rosan Harlene Cleotilde Olam CROME Devra in Treatment: 0 Education Assessment Education Provided To: Patient Education Topics Provided Wound/Skin Impairment: Methods: Demonstration, Explain/Verbal Responses: State content correctly Electronic Signature(s) Signed: 07/05/2023 6:26:05 PM By: [name] RN [name], [name] ([id number]) [id number] Page 8 of 9 Entered By: Claven [name] on 07/05/2023 06:07:54 -------------------------------------------------------------------------------- Wound Assessment Details Patient Name: Date of Service: [name], PA [name]. 07/05/2023 8:00 A M Medical Record Number: [id number] Patient Account Number: [id number] Date of Birth/Sex: Treating RN: 07/26/1940 (83 y.o. [name] Primary Care B[place]: Cleotilde Olam CROME Other Clinician: Referring Gadge Hermiz: Treating Walaa Carel/Extender: Rosan Harlene Cleotilde Olam CROME Devra in Treatment: 0 Wound Status Wound  Number: 2 Primary Skin T ear Etiology: Wound Location: Left, Anterior Lower Leg Wound Open Wounding Event: Hematoma Status: Date Acquired: 06/25/2023 Comorbid Cataracts, Chronic Obstructive Pulmonary Disease (COPD), Weeks Of Treatment: 0 History: Arrhythmia, Raynauds, Scleroderma, Confinement Anxiety Clustered Wound: No Photos Wound Measurements Length: (cm) 5 Width: (cm) 5.6 Depth: (cm) 0.1 Area: (cm) 21.991 Volume: (cm) 2.199 % Reduction in Area: % Reduction in Volume: Epithelialization: Small (1-33%) Tunneling: No Undermining: No Wound Description Classification: Full Thickness Without Exposed Suppor Wound Margin: Fibrotic scar, thickened scar Exudate Amount: Medium Exudate Type: Serosanguineous Exudate Color: red, brown t Structures Foul Odor After Cleansing: No Slough/Fibrino Yes Wound Bed Granulation Amount: Large (67-100%) Exposed Structure Granulation Quality: Red Fat Layer (Subcutaneous Tissue) Exposed: Yes Necrotic Amount: Small (1-33%) Necrotic Quality: Eschar, Adherent Slough Periwound Skin Texture Texture Color No Abnormalities Noted: No No Abnormalities Noted: Yes Scarring: Yes Temperature / Pain Temperature: No Abnormality Moisture No Abnormalities Noted: Yes Treatment Notes Wound #2 (Lower Leg) Wound Laterality: Left, Anterior Cleanser [name], [name] ([id number]) [id number] Page 9 of 9 Soap and Water Discharge Instruction: (wash leg in clinic when leg wrappings are on) May shower and wash wound with dial antibacterial soap and water prior to dressing change. Wound Cleanser Discharge Instruction: Cleanse the wound with wound cleanser prior to applying a clean dressing using gauze sponges, not tissue or cotton balls. Peri-Wound Care Sween Lotion (Moisturizing lotion) Discharge Instruction: Apply moisturizing lotion as directed Topical Gentamicin Discharge Instruction: Apply to  open wound  not the skin flap in the  middle of the wound Mupirocin Ointment Discharge Instruction: Apply to open wound  not the skin flap in the middle of the wound Primary Dressing PolyMem Silver Non-Adhesive Dressing, 4.25x4.25 in Discharge Instruction: Apply to wound bed as instructed Secondary Dressing ABD Pad, 8x10 Discharge Instruction: Apply over primary dressing as directed. Secured With Compression Wrap Kerlix Roll 4.5x3.1 (in/yd) Discharge Instruction: Apply Kerlix and Coban compression as directed. Coban Self-Adherent Wrap 4x5 (in/yd) Discharge Instruction: Apply over Kerlix as directed. Compression Stockings Add-Ons Electronic Signature(s) Signed: 07/05/2023 6:26:05 PM By: [name] RN Entered By: Scotton, Joanne on 07/05/2023 05:53:22 -------------------------------------------------------------------------------- Vitals Details Patient Name: Date of Service: [name], PA [name]. 07/05/2023 8:00 A M Medical Record Number: [id number] Patient Account Number: [id number] Date of Birth/Sex: Treating RN: 1941-06-09 (83 y.o. [name] Primary Care Winfred Iiams: Cleotilde Olam CROME Other Clinician: Referring Dirk Vanaman: Treating Danil Wedge/Extender: Rosan Harlene Cleotilde Olam CROME Devra in Treatment: 0 Vital Signs Time Taken: 08:18 Temperature (F): 98.1 Height (in): 64 Pulse (bpm): 57 Weight (lbs): 133 Respiratory Rate (breaths/min): 16 Body Mass Index (BMI): 22.8 Blood Pressure (mmHg): 120/74 Reference Range: 80 - 120 mg / dl Electronic Signature(s) Signed: 07/05/2023 6:26:05 PM By: [name] RN Entered By: Claven [name] on 07/05/2023 05:33:23

## 2023-07-06 NOTE — Patient Instructions (Addendum)
 Interstitial pulmonary disease (HCC) Scl-70 antibody positive [name] phenomenon without gangrene High risk medication use Therapeutic drug monitoring    - CLinically stable ILD x  2 years . CT dec 2023 shows mild ILD.  ; last CT -Now dealing with for wound healing in the left lower extremity shin following trauma   Plan -Continue prednisone  but let see if you can handle a taper especially with poor wound healing  -Cut down prednisone  to 4 mg/day for 2 weeks and then down to 3 mg a day for 2 weeks and then 2 mg a day for 2 weeks and then 1 mg a day for 2 weeks and then 1 mg on Monday Wednesday Friday for 2 weeks and stop  -If you are having steroid withdrawal then please let us  know -Continue Myfortic  as before - Continue Bactrim  as before -Do high-resolution CT chest in 3 months and return for follow-up   Pulmonary emphysema, unspecified emphysema type (HCC)  Plan -  Continue spiriva     Followup 14 weeks s with Dr. Geronimo ' 15-minute visit but after CT scan  -Evaluate stability of interstitial lung disease but also wound healing and prednisone  taper  - symptom score and walk test at followup  - Call or return sooner if needed

## 2023-07-06 NOTE — Progress Notes (Addendum)
 Office Visit Note   Patient: [name]           Date of Birth: [birth year]           MRN: [id number] Visit Date: 07/06/2023              Requested by: Cleotilde Planas, MD [address] [place],  [place] [address] PCP: Cleotilde Planas, MD   Assessment & Plan: Visit Diagnoses:  1. Unilateral primary osteoarthritis, left knee     Plan: Left knee osteoarthritis.  We discussed repeat cortisone injection today.  We have also discussed viscosupplementation injection versus total knee arthroplasty in the future.  She will let us  know.  Follow-up as needed.  This patient is diagnosed with osteoarthritis of the knee(s).    Radiographs show evidence of joint space narrowing, osteophytes, subchondral sclerosis and/or subchondral cysts.  This patient has knee pain which interferes with functional and activities of daily living.    This patient has experienced inadequate response, adverse effects and/or intolerance with conservative treatments such as acetaminophen , NSAIDS, topical creams, physical therapy or regular exercise, knee bracing and/or weight loss.   This patient has experienced inadequate response or has a contraindication to intra articular steroid injections for at least 3 months.   This patient is not scheduled to have a total knee replacement within 6 months of starting treatment with viscosupplementation.   Follow-Up Instructions: Return if symptoms worsen or fail to improve.   Orders:  Orders Placed This Encounter  Procedures   Large Joint Inj: L knee   Meds ordered this encounter  Medications   bupivacaine  (MARCAINE ) 0.25 % (with pres) injection 2 mL   lidocaine  (XYLOCAINE ) 1 % (with pres) injection 2 mL   methylPREDNISolone  acetate (DEPO-MEDROL ) injection 40 mg      Procedures: Large Joint Inj: L knee on 07/06/2023 9:11 AM Indications: pain Details: 22 G needle, anterolateral approach Medications: 2 mL lidocaine  1 %; 2 mL bupivacaine  0.25 %; 40 mg  methylPREDNISolone  acetate 40 MG/ML      Clinical Data: No additional findings.   Subjective: Chief Complaint  Patient presents with   Left Knee - Pain    HPI patient is a pleasant 83 year old female who comes in today with recurrent left knee pain.  History of underlying osteoarthritis.  She was seen in our office 03/28/2023 where left knee was injected with cortisone.  This significantly helped.  Pain started to return about a month ago and has gradually worsened.  The pain is primarily to the anterior knee and is worse going from a seated to standing position as well as at night.  She has tried Tylenol  without relief.  No previous Visco injection.  Review of Systems as detailed in HPI.  All others reviewed and negative.   Objective: Vital Signs: There were no vitals taken for this visit.  Physical Exam well-developed well-nourished female in no acute distress.  Alert and oriented x 3.  Ortho Exam left knee exam: No effusion.  Range of motion 0 to 120 degrees.  Medial lateral joint line tenderness.  She is neurovascularly intact distally.  Specialty Comments:  May need valium pre-ESI  EXAM: MRI LUMBAR SPINE WITHOUT CONTRAST   TECHNIQUE: Multiplanar, multisequence MR imaging of the lumbar spine was performed. No intravenous contrast was administered.   COMPARISON:  MR lumbar 05/01/2015; X-ray lumbar 10/05/2021.   FINDINGS: Segmentation: Standard; the lowest formed disc space is designated L5-S1.   Alignment: There is mild levocurvature centered at L2.  Trace anterolisthesis of L4 on L5 is unchanged. There is no other antero or retrolisthesis.   Vertebrae: There is mild compression deformity of the L3 vertebral body with up to approximately 10% loss of vertebral body height anteriorly, new since 2016, but without marrow edema to suggest acute or subacute fracture. The appearance is likely unchanged compared to the radiographs from 01/22/2019. The other  vertebral body heights are preserved. Background marrow signal is normal. There is a small intraosseous hemangioma in the right L5 vertebral body. There is no suspicious marrow signal abnormality or marrow edema.   Conus medullaris and cauda equina: Conus extends to the L1 level. Conus and cauda equina appear normal.   Paraspinal and other soft tissues: Unremarkable.   Disc levels:   There is mild multilevel disc desiccation and narrowing, overall progressed since 2016. There is multilevel facet arthropathy, most advanced at L4-L5 and worse on the right at this level.   T12-L1: No significant spinal canal or neural foraminal stenosis   L1-L2: There is a mild disc bulge eccentric to the left and mild facet arthropathy resulting in mild left and no significant right neural foraminal stenosis and no significant spinal canal stenosis, not significantly changed.   L2-L3: There is a mild disc bulge and mild bilateral facet arthropathy resulting in mild right and no significant left neural foraminal stenosis without significant spinal canal stenosis minimally progressed since 2016.   L3-L4: There is a mild disc bulge and mild bilateral facet arthropathy resulting in mild narrowing of the left subarticular zone without evidence of frank nerve root impingement and mild left and no significant right neural foraminal stenosis slightly progressed since 2016.   L4-L5: There is a mild disc bulge and mild bilateral facet arthropathy with a trace right effusion without significant spinal canal or neural foraminal stenosis. The facet arthropathy has slightly progressed since 2016   L5-S1: There is a broad-based left subarticular/foraminal protrusion and mild bilateral facet arthropathy. The protrusion abuts but does not appear to impinge the traversing left S1 nerve root. There is mild left and no significant right neural foraminal stenosis. Findings are slightly progressed since 2016.    IMPRESSION: 1. Mild anterior compression deformity of the L3 vertebral body is new since 2016 but without marrow edema to suggest acute fracture, similar in appearance to the radiographs from 2020. Otherwise, no evidence of acute injury in the lumbar spine. 2. Multilevel facet arthropathy, most advanced at L4-L5 and worse on the right, slightly progressed since 2016. 3. Otherwise, overall mild multilevel degenerative changes as detailed above have slightly progressed since 2016, but with no high-grade spinal canal or neural foraminal stenosis, and no evidence of nerve root impingement to explain the patient's right-sided pain. 4. Mild levoscoliosis centered at L2.     Electronically Signed By: Maude Harry M.D. On: 10/11/2021 10:04  Imaging: No results found.   PMFS History: Patient Active Problem List   Diagnosis Date Noted   Malnutrition of moderate degree 06/25/2022   Closed compression fracture of L2 lumbar vertebra, initial encounter (HCC) 06/21/2022   Intractable back pain 06/20/2022   High risk medication use 02/18/2019   Therapeutic drug monitoring 02/18/2019   Healthcare maintenance 02/18/2019   ILD (interstitial lung disease) (HCC) 11/28/2016   Multiple lung nodules on CT 11/28/2016   Abnormal laboratory test 11/18/2016   DDD L spine 11/18/2016   Scl-70 antibody positive 11/10/2016   History of hypothyroidism 11/10/2016   History of hemochromatosis 11/10/2016   Pulmonary emphysema (HCC)  08/31/2016   [name] phenomenon without gangrene 08/31/2016   Sinusitis, chronic 08/31/2016   Chronic cough 02/10/2016   Irritable larynx 02/10/2016   Stopped smoking with greater than 40 pack year history 02/10/2016   Chest pain, unspecified 10/06/2010   Shortness of breath 10/06/2010   Hypothyroidism    Hereditary hemochromatosis (HCC) 04/22/2010   ATRIAL FIBRILLATION 10/20/2008   PAC 10/20/2008   SYNCOPE AND COLLAPSE 10/20/2008   Past Medical History:  Diagnosis  Date   Atrial fibrillation (HCC)    a. Flecainide  therapy;  b. event monitor 4/12   Chest pain    a. GXT myoview  4/12: no isch., EF 86%;   b. 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 Freddie   History of syncope    Hypothyroidism    Osteoporosis     Family History  Problem Relation Age of Onset   Stroke Mother    Heart disease Father 63   Hemochromatosis Brother    Breast cancer Neg Hx     Past Surgical History:  Procedure Laterality Date   IR KYPHO LUMBAR INC FX REDUCE BONE BX UNI/BIL CANNULATION INC/IMAGING  06/30/2022   IR KYPHO LUMBAR INC FX REDUCE BONE BX UNI/BIL CANNULATION INC/IMAGING  08/16/2022   MOUTH SURGERY     SHOULDER ARTHROSCOPY Left    TOTAL ABDOMINAL HYSTERECTOMY     VARICOSE VEIN SURGERY     Social History   Occupational History   Occupation: RETIRED    Employer: RETIRED  Tobacco Use   Smoking status: Former    Current packs/day: 0.00    Average packs/day: 1 pack/day for 42.0 years (42.0 ttl pk-yrs)    Types: Cigarettes    Start date: 06/28/1955    Quit date: 06/27/1997    Years since quitting: 26.0    Passive exposure: Never   Smokeless tobacco: Never  Vaping Use   Vaping status: Never Used  Substance and Sexual Activity   Alcohol  use: Yes    Comment: occ   Drug use: No   Sexual activity: Not on file

## 2023-07-06 NOTE — Progress Notes (Signed)
 [name], [name] ([id number]) [id number] Page 1 of 9 Visit Report for 07/05/2023 Chief Complaint Document Details Patient Name: Date of Service: [name], [name] 07/05/2023 8:00 A M Medical Record Number: [id number] Patient Account Number: [id number] Date of Birth/Sex: Treating RN: 18-Aug-1940 (83 y.o. F) Primary Care Provider: Cleotilde Olam CROME Other Clinician: Referring Provider: Treating Provider/Extender: Rosan Harlene Cleotilde Olam CROME Devra in Treatment: 0 Information Obtained from: Patient Chief Complaint 07/05/2023; left lower extremity wound Electronic Signature(s) Signed: 07/05/2023 5:26:35 PM By: Rosan Harlene DO Entered By: Rosan Harlene on 07/05/2023 07:40:02 -------------------------------------------------------------------------------- HPI Details Patient Name: Date of Service: [name], [name] 07/05/2023 8:00 A M Medical Record Number: [id number] Patient Account Number: [id number] Date of Birth/Sex: Treating RN: 06/04/1941 (83 y.o. F) Primary Care Provider: Cleotilde Olam CROME Other Clinician: Referring Provider: Treating Provider/Extender: Rosan Harlene Cleotilde Olam CROME Devra in Treatment: 0 History of Present Illness HPI Description: ADMISSION 03/03/2022 This is an 83 year old woman with a past medical history significant for scleroderma, interstitial lung disease, [name] syndrome, chronic steroid use, hereditary hemochromatosis cytosis and emphysema. She is chronically on corticosteroids as well as mycophenolate . She was visiting her brother in [place]  when she fell walking up the steps to the house. She suffered a laceration to her right anterior lower leg. She was seen at a local healthcare facility. The wound was dressed and she was discharged. She has returned to [place]  and was referred to the wound care center for further evaluation and management. She and her daughter have been caring for it at home. They have been  applying a nonadherent gauze over the wound and wrapping it with Kerlix and Coban. There is a U-shaped wound on the patient's anterior tibial surface. The wound edges are actually secured with Steri-Strips and there is no exposed tissue. There does appear to have been some serous drainage that has crusted around the edges of the wound but there is no sign or concern for infection. Just proximal to the main wound, she has a smaller abrasion that does get down into the fat layer. There is a little bit of slough and eschar present at this site. 03/09/2022: Much of the wound has healed. The Steri-Strips were removed to reveal just a small open portion at the most distal part of the wound. There is slough accumulation. No concern for infection. 03/16/2022: There is just a small open portion of the wound. It has a little slough accumulation. She was complaining that the areas where the wound is healing and there is eschar, she is noticing a pulling or tight sensation. 03/26/2019: The open portion at the most caudal aspect of her wound is red, swollen, and more tender today. There is a bit of slough accumulation, but no purulent drainage. There is a linear opening area at the most proximal and lateral part of the wound with some slough accumulation; the rest of the wound has healed. 04/04/2022: The swelling and tenderness that were present last visit have resolved. There is just a small residual opening at the most distal portion of her wound with some thin eschar overlying it. Everything else has healed. 04/11/2022: Her wound has healed. 07/05/2023 [name] is an 83 year old female with a past medical history of scleroderma, interstitial lung disease, [name] syndrome, chronic steroid use, hereditary hemochromatosis cytosis, A-fib on Eliquis  and emphysema. She is chronically on corticosteroids as well as mycophenolate . She presents today with a left lower extremity wound. She states that about 2  weeks ago she fell and scraped her leg. She has a skin tear to her lower leg she has been using TENE, GATO ([id number]) [id number] Page 2 of 9 Xeroform. She is on chronic Bactrim  for her interstitial lung disease. She currently denies signs of infection. Electronic Signature(s) Signed: 07/05/2023 5:26:35 PM By: Rosan Raisin DO Entered By: Rosan Raisin on 07/05/2023 07:43:38 -------------------------------------------------------------------------------- Physical Exam Details Patient Name: Date of Service: [name], [name] 07/05/2023 8:00 A M Medical Record Number: [id number] Patient Account Number: [id number] Date of Birth/Sex: Treating RN: [birth year] (83 y.o. F) Primary Care Provider: Cleotilde Olam CROME Other Clinician: Referring Provider: Treating Provider/Extender: Rosan Raisin Cleotilde Olam CROME Devra in Treatment: 0 Constitutional respirations regular, non-labored and within target range for patient.. Cardiovascular 2+ dorsalis pedis/posterior tibialis pulses. Psychiatric pleasant and cooperative. Notes T the left lower extremity there is an open wound to the anterior shin with granulation tissue. There is intact epithelium in the middle of the wound bed that o appears to be viable still. No surrounding signs of infection. Venous stasis dermatitis. Decent edema control. Electronic Signature(s) Signed: 07/05/2023 5:26:35 PM By: Rosan Raisin DO Entered By: Rosan Raisin on 07/05/2023 07:44:56 -------------------------------------------------------------------------------- Physician Orders Details Patient Name: Date of Service: [name], [name] 07/05/2023 8:00 A M Medical Record Number: [id number] Patient Account Number: [id number] Date of Birth/Sex: Treating RN: 19-Apr-1941 (83 y.o. [name] Primary Care Provider: Cleotilde Olam CROME Other Clinician: Referring Provider: Treating Provider/Extender: Rosan Raisin Cleotilde Olam CROME Devra in Treatment: 0 Verbal / Phone Orders: No Diagnosis Coding Follow-up Appointments ppointment in 1 week. - Dr. Rosan Return A Anesthetic Wound #2 Left,Anterior Lower Leg (In clinic) Topical Lidocaine  4% applied to wound bed Bathing/ Shower/ Hygiene May shower with protection but do not get wound dressing(s) wet. Protect dressing(s) with water repellant cover (for example, large plastic bag) or a cast cover and may then take shower. - Please be careful when using the cast protector, it can be very slippery! Additional Orders / Instructions Follow Nutritious Diet - Please try and have at least 60g-100g protein a day. ETHELYN, CERNIGLIA ([id number]) [id number] Page 3 of 9 Wound Treatment Wound #2 - Lower Leg Wound Laterality: Left, Anterior Cleanser: Soap and Water 1 x Per Week/30 Days Discharge Instructions: (wash leg in clinic when leg wrappings are on) May shower and wash wound with dial antibacterial soap and water prior to dressing change. Cleanser: Wound Cleanser 1 x Per Week/30 Days Discharge Instructions: Cleanse the wound with wound cleanser prior to applying a clean dressing using gauze sponges, not tissue or cotton balls. Peri-Wound Care: Sween Lotion (Moisturizing lotion) 1 x Per Week/30 Days Discharge Instructions: Apply moisturizing lotion as directed Topical: Gentamicin 1 x Per Week/30 Days Discharge Instructions: Apply to open wound  not the skin flap in the middle of the wound Topical: Mupirocin Ointment 1 x Per Week/30 Days Discharge Instructions: Apply to open wound  not the skin flap in the middle of the wound Prim Dressing: PolyMem Silver Non-Adhesive Dressing, 4.25x4.25 in 1 x Per Week/30 Days ary Discharge Instructions: Apply to wound bed as instructed Secondary Dressing: ABD Pad, 8x10 1 x Per Week/30 Days Discharge Instructions: Apply over primary dressing as directed. Compression Wrap: Kerlix Roll 4.5x3.1 (in/yd) 1  x Per Week/30 Days Discharge Instructions: Apply Kerlix and Coban compression as directed. Compression Wrap: Coban Self-Adherent Wrap 4x5 (in/yd) 1 x Per Week/30 Days Discharge Instructions: Apply over Kerlix as directed. Electronic Signature(s) Signed: 07/05/2023  5:26:35 PM By: Rosan Raisin DO Entered By: Rosan Raisin on 07/05/2023 07:45:10 -------------------------------------------------------------------------------- Problem List Details Patient Name: Date of Service: [name], [name] 07/05/2023 8:00 A M Medical Record Number: [id number] Patient Account Number: [id number] Date of Birth/Sex: Treating RN: 12/10/1940 (83 y.o. F) Primary Care Provider: Cleotilde Olam CROME Other Clinician: Referring Provider: Treating Provider/Extender: Rosan Raisin Cleotilde Olam CROME Devra in Treatment: 0 Active Problems ICD-10 Encounter Code Description Active Date MDM Diagnosis [phone] Non-pressure chronic ulcer of other part of left lower leg with fat layer exposed1/01/2024 No Yes I87.312 Chronic venous hypertension (idiopathic) with ulcer of left lower extremity 07/05/2023 No Yes J84.9 Interstitial pulmonary disease, unspecified 07/05/2023 No Yes Z79.01 Long term (current) use of anticoagulants 07/05/2023 No Yes I48.20 Chronic atrial fibrillation, unspecified 07/05/2023 No Yes JUDAH, CHEVERE ([id number]) [phone].pdf Page 4 of 9 Inactive Problems Resolved Problems Electronic Signature(s) Signed: 07/05/2023 5:26:35 PM By: Rosan Raisin DO Entered By: Rosan Raisin on 07/05/2023 09:34:27 -------------------------------------------------------------------------------- Progress Note Details Patient Name: Date of Service: [name], [name] 07/05/2023 8:00 A M Medical Record Number: [id number] Patient Account Number: [id number] Date of Birth/Sex: Treating RN: [birth year] (83 y.o. F) Primary Care Provider: Cleotilde Olam CROME Other Clinician: Referring  Provider: Treating Provider/Extender: Rosan Raisin Cleotilde Olam CROME Devra in Treatment: 0 Subjective Chief Complaint Information obtained from Patient 07/05/2023; left lower extremity wound History of Present Illness (HPI) ADMISSION 03/03/2022 This is an 83 year old woman with a past medical history significant for scleroderma, interstitial lung disease, [name] syndrome, chronic steroid use, hereditary hemochromatosis cytosis and emphysema. She is chronically on corticosteroids as well as mycophenolate . She was visiting her brother in [place]  when she fell walking up the steps to the house. She suffered a laceration to her right anterior lower leg. She was seen at a local healthcare facility. The wound was dressed and she was discharged. She has returned to [place]  and was referred to the wound care center for further evaluation and management. She and her daughter have been caring for it at home. They have been applying a nonadherent gauze over the wound and wrapping it with Kerlix and Coban. There is a U-shaped wound on the patient's anterior tibial surface. The wound edges are actually secured with Steri-Strips and there is no exposed tissue. There does appear to have been some serous drainage that has crusted around the edges of the wound but there is no sign or concern for infection. Just proximal to the main wound, she has a smaller abrasion that does get down into the fat layer. There is a little bit of slough and eschar present at this site. 03/09/2022: Much of the wound has healed. The Steri-Strips were removed to reveal just a small open portion at the most distal part of the wound. There is slough accumulation. No concern for infection. 03/16/2022: There is just a small open portion of the wound. It has a little slough accumulation. She was complaining that the areas where the wound is healing and there is eschar, she is noticing a pulling or tight sensation. 03/26/2019: The  open portion at the most caudal aspect of her wound is red, swollen, and more tender today. There is a bit of slough accumulation, but no purulent drainage. There is a linear opening area at the most proximal and lateral part of the wound with some slough accumulation; the rest of the wound has healed. 04/04/2022: The swelling and tenderness that were present last visit have resolved. There is just  a small residual opening at the most distal portion of her wound with some thin eschar overlying it. Everything else has healed. 04/11/2022: Her wound has healed. 07/05/2023 [name] is an 83 year old female with a past medical history of scleroderma, interstitial lung disease, [name] syndrome, chronic steroid use, hereditary hemochromatosis cytosis, A-fib on Eliquis  and emphysema. She is chronically on corticosteroids as well as mycophenolate . She presents today with a left lower extremity wound. She states that about 2 weeks ago she fell and scraped her leg. She has a skin tear to her lower leg she has been using Xeroform. She is on chronic Bactrim  for her interstitial lung disease. She currently denies signs of infection. Patient History Information obtained from Patient. Allergies No Known Allergies Family History Diabetes - Siblings, Heart Disease - Father, Hypertension - Mother, Kidney Disease - Father, Stroke - Mother, No family history of Cancer, Hereditary Spherocytosis, Lung Disease, Seizures, Thyroid  Problems, Tuberculosis. Social History Former smoker - quit 25 yrs ago, Marital Status - Widowed, Alcohol  Use - Moderate, Drug Use - No History, Caffeine Use - Daily - coffee. [name], [name] ([id number]) [id number] Page 5 of 9 Medical History Eyes Patient has history of Cataracts - bil removed Ear/Nose/Mouth/Throat Denies history of Chronic sinus problems/congestion, Middle ear problems Respiratory Patient has history of Chronic Obstructive  Pulmonary Disease (COPD) - mild Denies history of Aspiration, Asthma, Pneumothorax, Sleep Apnea, Tuberculosis Cardiovascular Patient has history of Arrhythmia - afib Endocrine Denies history of Type I Diabetes, Type II Diabetes Genitourinary Denies history of End Stage Renal Disease Immunological Patient has history of Raynauds, Scleroderma - lungs Denies history of Lupus Erythematosus Integumentary (Skin) Denies history of History of Burn Oncologic Denies history of Received Chemotherapy, Received Radiation Psychiatric Patient has history of Confinement Anxiety Denies history of Anorexia/bulimia Hospitalization/Surgery History - mouth surgery. - left shouilder surgery. - total abdominal hysterectomy. - Kyphoplasty (L1 and L2). Medical A Surgical History Notes nd Ear/Nose/Mouth/Throat hearing loss, uses hearing aids Hematologic/Lymphatic hemochromatosis Respiratory pulmonary fibrosis, emphysema, Endocrine hypothyroidism Objective Constitutional respirations regular, non-labored and within target range for patient.. Vitals Time Taken: 8:18 AM, Height: 64 in, Weight: 133 lbs, BMI: 22.8, Temperature: 98.1 F, Pulse: 57 bpm, Respiratory Rate: 16 breaths/min, Blood Pressure: 120/74 mmHg. Cardiovascular 2+ dorsalis pedis/posterior tibialis pulses. Psychiatric pleasant and cooperative. General Notes: T the left lower extremity there is an open wound to the anterior shin with granulation tissue. There is intact epithelium in the middle of the o wound bed that appears to be viable still. No surrounding signs of infection. Venous stasis dermatitis. Decent edema control. Integumentary (Hair, Skin) Wound #2 status is Open. Original cause of wound was Hematoma. The date acquired was: 06/25/2023. The wound is located on the Left,Anterior Lower Leg. The wound measures 5cm length x 5.6cm width x 0.1cm depth; 21.991cm^2 area and 2.199cm^3 volume. There is Fat Layer (Subcutaneous Tissue)  exposed. There is no tunneling or undermining noted. There is a medium amount of serosanguineous drainage noted. The wound margin is fibrotic, thickened scar. There is large (67-100%) red granulation within the wound bed. There is a small (1-33%) amount of necrotic tissue within the wound bed including Eschar and Adherent Slough. The periwound skin appearance had no abnormalities noted for moisture. The periwound skin appearance had no abnormalities noted for color. The periwound skin appearance exhibited: Scarring. Periwound temperature was noted as No Abnormality. Assessment Active Problems ICD-10 Non-pressure chronic ulcer of other part of left lower leg with fat layer exposed Chronic venous hypertension (idiopathic)  with ulcer of left lower extremity Interstitial pulmonary disease, unspecified Long term (current) use of anticoagulants Chronic atrial fibrillation, unspecified [name], [name] ([id number]) [id number] Page 6 of 9 Patient presents with a 1-2-week history of wound to her left lower extremity secondary to trauma and difficult to heal in the setting of chronic steroid use and venous insufficiency. Overall wound bed does appear healthy with no overt signs of infection. She is on chronic Bactrim  for her interstitial lung disease. At this time I recommended antibiotic ointment along with Hydrofera Blue under Kerlix/Coban. She has had compression wraps in the past and done well with this. Follow-up in 1 week. Plan Follow-up Appointments: Return Appointment in 1 week. - Dr. Rosan Anesthetic: Wound #2 Left,Anterior Lower Leg: (In clinic) Topical Lidocaine  4% applied to wound bed Bathing/ Shower/ Hygiene: May shower with protection but do not get wound dressing(s) wet. Protect dressing(s) with water repellant cover (for example, large plastic bag) or a cast cover and may then take shower. - Please be careful when using the cast protector, it can be  very slippery! Additional Orders / Instructions: Follow Nutritious Diet - Please try and have at least 60g-100g protein a day. WOUND #2: - Lower Leg Wound Laterality: Left, Anterior Cleanser: Soap and Water 1 x Per Week/30 Days Discharge Instructions: (wash leg in clinic when leg wrappings are on) May shower and wash wound with dial antibacterial soap and water prior to dressing change. Cleanser: Wound Cleanser 1 x Per Week/30 Days Discharge Instructions: Cleanse the wound with wound cleanser prior to applying a clean dressing using gauze sponges, not tissue or cotton balls. Peri-Wound Care: Sween Lotion (Moisturizing lotion) 1 x Per Week/30 Days Discharge Instructions: Apply moisturizing lotion as directed Topical: Gentamicin 1 x Per Week/30 Days Discharge Instructions: Apply to open wound  not the skin flap in the middle of the wound Topical: Mupirocin Ointment 1 x Per Week/30 Days Discharge Instructions: Apply to open wound  not the skin flap in the middle of the wound Prim Dressing: PolyMem Silver Non-Adhesive Dressing, 4.25x4.25 in 1 x Per Week/30 Days ary Discharge Instructions: Apply to wound bed as instructed Secondary Dressing: ABD Pad, 8x10 1 x Per Week/30 Days Discharge Instructions: Apply over primary dressing as directed. Com pression Wrap: Kerlix Roll 4.5x3.1 (in/yd) 1 x Per Week/30 Days Discharge Instructions: Apply Kerlix and Coban compression as directed. Com pression Wrap: Coban Self-Adherent Wrap 4x5 (in/yd) 1 x Per Week/30 Days Discharge Instructions: Apply over Kerlix as directed. 1. Antibiotic ointment with Hydrofera Blue under Kerlix/Cobanleft lower extremity 2. Follow-up in 1 week Electronic Signature(s) Signed: 07/05/2023 5:26:35 PM By: Rosan Raisin DO Entered By: Rosan Raisin on 07/05/2023 09:34:43 -------------------------------------------------------------------------------- HxROS Details Patient Name: Date of Service: [name], [name]  07/05/2023 8:00 A M Medical Record Number: [id number] Patient Account Number: [id number] Date of Birth/Sex: Treating RN: 08-04-1940 (83 y.o. [name] Primary Care Provider: Cleotilde Olam CROME Other Clinician: Referring Provider: Treating Provider/Extender: Rosan Raisin Cleotilde Olam CROME Devra in Treatment: 0 Information Obtained From Patient Eyes Medical History: Positive for: Cataracts - bil removed Ear/Nose/Mouth/Throat Medical History: Negative for: Chronic sinus problems/congestion; Middle ear problems Past Medical History Notes: hearing loss, uses hearing aids [name], [name] ([id number]) [id number] Page 7 of 9 Hematologic/Lymphatic Medical History: Past Medical History Notes: hemochromatosis Respiratory Medical History: Positive for: Chronic Obstructive Pulmonary Disease (COPD) - mild Negative for: Aspiration; Asthma; Pneumothorax; Sleep Apnea; Tuberculosis Past Medical History Notes: pulmonary fibrosis, emphysema, Cardiovascular Medical History: Positive for: Arrhythmia -  afib Endocrine Medical History: Negative for: Type I Diabetes; Type II Diabetes Past Medical History Notes: hypothyroidism Genitourinary Medical History: Negative for: End Stage Renal Disease Immunological Medical History: Positive for: Raynauds; Scleroderma - lungs Negative for: Lupus Erythematosus Integumentary (Skin) Medical History: Negative for: History of Burn Oncologic Medical History: Negative for: Received Chemotherapy; Received Radiation Psychiatric Medical History: Positive for: Confinement Anxiety Negative for: Anorexia/bulimia HBO Extended History Items Eyes: Cataracts Immunizations Pneumococcal Vaccine: Received Pneumococcal Vaccination: Yes Received Pneumococcal Vaccination On or After 60th Birthday: Yes Implantable Devices None Hospitalization / Surgery History Type of Hospitalization/Surgery mouth surgery left shouilder  surgery total abdominal hysterectomy Kyphoplasty (L1 and L2) Family and Social History Cancer: No; Diabetes: Yes - Siblings; Heart Disease: Yes - Father; Hereditary Spherocytosis: No; Hypertension: Yes - Mother; Kidney Disease: Yes - Father; Lung Disease: No; Seizures: No; Stroke: Yes - Mother; Thyroid  Problems: No; Tuberculosis: No; Former smoker - quit 25 yrs ago; Marital Status - Widowed; Alcohol  Use: Moderate; Drug Use: No History; Caffeine Use: Daily - coffee [place], Female J ([id number]) [id number] Page 8 of 9 Social Determinants of Health (SDOH) 1. In the past 2 months, did you or others you live with eat smaller meals or skip meals because you didn't have money for foodo : No 2. Are you homeless or worried that you might be in the futureo : No 3. Do you have trouble paying for your utilities (gas, electricity, phone)o : No 4. Do you have trouble finding or paying for a rideo : No 5. Do you need daycare, or better daycare, for your kidso : No 6. Are you unemployed or without regular incomeo : No 7. Do you need help finding a better jobo : No 8. Do you need help getting more educationo : No 9. Are you concerned about someone in your home using drugs or alcoholo : No 10. Do you feel unsafe in your daily lifeo : No 11. Is anyone in your home threatening or abusing youo : No 12. Do you lack quality relationships that make you feel valued and supportedo : No 13. Do you need help getting cultural information in a language you understando : No 14. Do you need help getting internet accesso : No Advanced Directives and Instructions Spiritual or Cultural beliefs preclude asking about Advance Care Planning: No Advanced Directives: Yes; On file at [facility] Copy Provided: No Do not resuscitate: No Living Will: No Medical Power of Attorney: No Surrogate Decision Maker: No Electronic Signature(s) Signed: 07/05/2023 5:26:35 PM By: Rosan Raisin DO Signed:  07/05/2023 6:26:05 PM By: [name] RN Entered By: Claven [name] on 07/05/2023 05:24:51 -------------------------------------------------------------------------------- SuperBill Details Patient Name: Date of Service: [name], [name] 07/05/2023 Medical Record Number: [id number] Patient Account Number: [id number] Date of Birth/Sex: Treating RN: [birth year] (83 y.o. [name] Primary Care Provider: Cleotilde Olam CROME Other Clinician: Referring Provider: Treating Provider/Extender: Rosan Raisin Cleotilde Olam CROME Devra in Treatment: 0 Diagnosis Coding ICD-10 Codes Code Description [phone] Non-pressure chronic ulcer of other part of left lower leg with fat layer exposed I87.312 Chronic venous hypertension (idiopathic) with ulcer of left lower extremity J84.9 Interstitial pulmonary disease, unspecified Z79.01 Long term (current) use of anticoagulants I48.20 Chronic atrial fibrillation, unspecified Facility Procedures : CPT4 Code: [id number] Description: 00785 - WOUND CARE VISIT-LEV 4 EST PT Modifier: Quantity: 1 Physician Procedures Electronic Signature(s) Signed: 07/05/2023 5:26:35 PM By: Rosan Raisin DO Entered By: Rosan Raisin on 07/05/2023 09:34:56

## 2023-07-06 NOTE — Patient Instructions (Signed)
 Spirometry/DLCO performed today.

## 2023-07-06 NOTE — Progress Notes (Signed)
 [name], [name] ([id number]) [id number] Page 1 of 4 Visit Report for 07/05/2023 Abuse Risk Screen Details Patient Name: Date of Service: [name], [name] 07/05/2023 8:00 A M Medical Record Number: [id number] Patient Account Number: [id number] Date of Birth/Sex: Treating RN: 20-Mar-1941 (83 y.o. [name] Primary Care Deetra Booton: Cleotilde Olam CROME Other Clinician: Referring Nader Boys: Treating Ignatius Kloos/Extender: Rosan Harlene Cleotilde Olam CROME Devra in Treatment: 0 Abuse Risk Screen Items Answer ABUSE RISK SCREEN: Has anyone close to you tried to hurt or harm you recentlyo No Do you feel uncomfortable with anyone in your familyo No Has anyone forced you do things that you didnt want to doo No Electronic Signature(s) Signed: 07/05/2023 6:26:05 PM By: [name] RN Entered By: Claven [name] on 07/05/2023 05:24:59 -------------------------------------------------------------------------------- Activities of Daily Living Details Patient Name: Date of Service: [name], [name] 07/05/2023 8:00 A M Medical Record Number: [id number] Patient Account Number: [id number] Date of Birth/Sex: Treating RN: April 20, 1941 (83 y.o. [name] Primary Care Jailynn Lavalais: Cleotilde Olam CROME Other Clinician: Referring Jacori Mulrooney: Treating Caedon Bond/Extender: Rosan Harlene Cleotilde Olam CROME Devra in Treatment: 0 Activities of Daily Living Items Answer Activities of Daily Living (Please select one for each item) Drive Automobile Completely Able T Medications ake Completely Able Use T elephone Completely Able Care for Appearance Completely Able Use T oilet Completely Able Bath / Shower Completely Able Dress Self Completely Able Feed Self Completely Able Walk Completely Able Get In / Out Bed Completely Able Housework Completely Able Prepare Meals Completely Able Handle Money Completely Able Shop for Self Completely Able Electronic Signature(s) Signed:  07/05/2023 6:26:05 PM By: [name] RN Entered By: Claven [name] on 07/05/2023 05:25:59 Grimley, AVELINA PARAS ([id number]) [id number] Page 2 of 4 -------------------------------------------------------------------------------- Education Screening Details Patient Name: Date of Service: [name], [name] 07/05/2023 8:00 A M Medical Record Number: [id number] Patient Account Number: [id number] Date of Birth/Sex: Treating RN: 05/31/1941 (83 y.o. [name] Primary Care Londan Coplen: Cleotilde Olam CROME Other Clinician: Referring Lashandra Arauz: Treating Alyne Martinson/Extender: Rosan Harlene Cleotilde Olam CROME Devra in Treatment: 0 Primary Learner Assessed: Patient Learning Preferences/Education Level/Primary Language Learning Preference: Explanation, Demonstration Highest Education Level: College or Above Preferred Language: English Cognitive Barrier Language Barrier: No Translator Needed: No Memory Deficit: No Emotional Barrier: No Cultural/Religious Beliefs Affecting Medical Care: No Physical Barrier Impaired Vision: No Impaired Hearing: No Decreased Hand dexterity: No Knowledge/Comprehension Knowledge Level: High Comprehension Level: High Ability to understand written instructions: High Ability to understand verbal instructions: High Motivation Anxiety Level: Calm Cooperation: Cooperative Education Importance: Acknowledges Need Interest in Health Problems: Asks Questions Perception: Coherent Willingness to Engage in Self-Management High Activities: Readiness to Engage in Self-Management High Activities: Electronic Signature(s) Signed: 07/05/2023 6:26:05 PM By: [name] RN Entered By: Claven [name] on 07/05/2023 94:71:71 -------------------------------------------------------------------------------- Fall Risk Assessment Details Patient Name: Date of Service: [name], [name] 07/05/2023 8:00 A M Medical Record Number:  [id number] Patient Account Number: [id number] Date of Birth/Sex: Treating RN: 07/28/1940 (83 y.o. [name] Primary Care Jola Critzer: Cleotilde Olam CROME Other Clinician: Referring Nazaiah Navarrete: Treating Laiylah Roettger/Extender: Rosan Harlene Cleotilde Olam CROME Devra in Treatment: 0 Fall Risk Assessment Items Have you had 2 or more falls in the last 12 monthso 0 No ADANA, MARIK ([id number]) [phone] [id number] Page 3 of 4 Have you had any fall that resulted in injury in the last 12 monthso 0 Yes FALLS RISK SCREEN History of falling - immediate or within 3 months 0  No Secondary diagnosis (Do you have 2 or more medical diagnoseso) 0 No Ambulatory aid None/bed rest/wheelchair/nurse 0 No Crutches/cane/walker 0 No Furniture 0 No Intravenous therapy Access/Saline/Heparin Lock 0 No Gait/Transferring Normal/ bed rest/ wheelchair 0 No Weak (short steps with or without shuffle, stooped but able to lift head while walking, may seek 0 No support from furniture) Impaired (short steps with shuffle, may have difficulty arising from chair, head down, impaired 0 No balance) Mental Status Oriented to own ability 0 No Electronic Signature(s) Signed: 07/05/2023 6:26:05 PM By: [name] RN Entered By: Claven [name] on 07/05/2023 05:28:54 -------------------------------------------------------------------------------- Foot Assessment Details Patient Name: Date of Service: [name], [name] 07/05/2023 8:00 A M Medical Record Number: [id number] Patient Account Number: [id number] Date of Birth/Sex: Treating RN: 03/27/1941 (83 y.o. [name] Primary Care Nirel Babler: Cleotilde Olam CROME Other Clinician: Referring Ivoree Felmlee: Treating Desere Gwin/Extender: Rosan Harlene Cleotilde Olam CROME Devra in Treatment: 0 Foot Assessment Items Site Locations + = Sensation present, - = Sensation absent, C = Callus, U = Ulcer R = Redness, W = Warmth, M = Maceration, PU = Pre-ulcerative  lesion F = Fissure, S = Swelling, D = Dryness Assessment Right: Left: Other Deformity: No No Prior Foot Ulcer: No No Prior Amputation: No No Charcot Joint: No No Ambulatory Status: Ambulatory Without Help GaitSHERILYN, WINDHORST ([id number]) [id number] Page 4 of 4 Electronic Signature(s) Signed: 07/05/2023 6:26:05 PM By: [name] RN Entered By: Claven [name] on 07/05/2023 05:38:41 -------------------------------------------------------------------------------- Nutrition Risk Screening Details Patient Name: Date of Service: [name], [name] 07/05/2023 8:00 A M Medical Record Number: [id number] Patient Account Number: [id number] Date of Birth/Sex: Treating RN: Mar 07, 1941 (83 y.o. [name] Primary Care Morganne Haile: Cleotilde Olam CROME Other Clinician: Referring Keron Neenan: Treating Lakeem Rozo/Extender: Rosan Harlene Cleotilde Olam CROME Devra in Treatment: 0 Height (in): 64 Weight (lbs): 133 Body Mass Index (BMI): 22.8 Nutrition Risk Screening Items Score Screening NUTRITION RISK SCREEN: I have an illness or condition that made me change the kind and/or amount of food I eat 0 No I eat fewer than two meals per day 0 No I eat few fruits and vegetables, or milk products 0 No I have three or more drinks of beer, liquor or wine almost every day 0 No I have tooth or mouth problems that make it hard for me to eat 0 No I don't always have enough money to buy the food I need 0 No I eat alone most of the time 0 No I take three or more different prescribed or over-the-counter drugs a day 1 Yes Without wanting to, I have lost or gained 10 pounds in the last six months 0 No I am not always physically able to shop, cook and/or feed myself 0 No Nutrition Protocols Good Risk Protocol 0 No interventions needed Moderate Risk Protocol High Risk Proctocol Risk Level: Good Risk Score: 1 Electronic Signature(s) Signed: 07/05/2023 6:26:05 PM By:  [name] RN Entered By: Claven [name] on 07/05/2023 05:30:20

## 2023-07-06 NOTE — Addendum Note (Signed)
 Addended by: Glynda Jaeger on: 07/06/2023 03:24 PM   Modules accepted: Orders

## 2023-07-06 NOTE — Progress Notes (Signed)
 PCP Tara C, PA-[name]   HPI  IOV 02/10/2016  Chief Complaint  Patient presents with   Pulmonary Consult    Pt referred by Dr. Sophronia for chronic cough x 1 year. Pt states she feels she has a tickle that is causing her dry cough. Pt states she has DOE when climbing stairs. Pt deneis CP/tightness.     83 year old female with hemochromatosis homozygous gene followed by Dr. Freddie (last phlebotomy many years ago and is on serial monitoring) with children and siblings with active disease. She has also atrial fibrillation followed by Dr. Fernande. Reports insidious onset of chronic cough in the last 1 year. It is stable since onset. It fluctuates between mild and severe in severity. It is mostly dry in quality. Mostly present in the daytime but sometimes also wakes her up at night. It is definitely not progressive. It is episodic and present every day. Aggravated by talking sometimes and then the throat feels dry associated with tickle in the throat and also sensation the cough is coming from the upper chest retrosternally and sometimes relieved by drinking water or chewing on a lozenge. Also aggravated by seasonal changes particularly in the spring and the fall which makes her think she has allergies. There is sometimes associated gag. She also reports nonspecific occasional wheezing and associated shortness of breath that is nonspecific but present with exertion and relieved by rest No other clear cut aggravating or relieving factors.   cough associated history  - Medications: She is not on fish oil or ACE inhibitors - Sinus drainage: She does admit to spring allergies. She did have something removed from her hard palate several years ago by ENT does not know details - Acid reflux: She says she is significant acid reflux and is on Prilosec. Without which she'll have significant symptoms - Pulmonary disease: FenO 12 02/10/2016  and normal. She denies any personal history of asthma or  pulmonary fibrosis of COPD or emphysema smoked one pack per day started smoking at age 64 and quit in 1999. Making a 42 pack smoking history. Chest x-ray 08/26/2014 personally visualized is clear - Tobacco :  reports that she quit smoking about 18 years ago. She has a 42.00 pack-year smoking history. She has never used smokeless tobacco.       OV 02/18/2016  Chief Complaint  Patient presents with   Follow-up    Pt here after PFT and HRCT. Pt denies changes in SOB and cough. Pt denies any new complaints at this time.    Follow-up chronic cough. This visit is to follow-up on test results of function test and high resolution CT chest which are described below9 results show isolated reduction in diffusion capacity which can be explained by possible early ILD and also emphysema. There are also new findings of nodule 6 mm bilaterally on lower lobes. She prefers a very cautious approach to this evaluation.     Pulmonary function test 02/12/2016 - FVC 2.8 cm/101%, FEV1 1.9 L/91%. Ratio 60/90%. Total lung capacity 115%. DLCO reduced at 14.5/60%. She is isolated reduction in diffusion capacity    HRCT chest 02/16/16 IMPRESSION: 1. Mild patchy subpleural reticulation in both lungs with a basilar predominance. No significant traction bronchiectasis. No frank honeycombing. Findings could represent an interstitial lung disease such as nonspecific interstitial pneumonia (NSIP), with early usual interstitial pneumonia (UIP) not excluded. A follow-up high-resolution chest CT study in 12 months is recommended to assess temporal pattern stability. 2. Bilateral lower lobe  solid pulmonary nodules, largest 6 mm. Non-contrast chest CT at 3-6 months is recommended. If the nodules are stable at time of repeat CT, then future CT at 18-24 months (from today's scan) is considered optional for low-risk patients, but is recommended for high-risk patients. This recommendation follows the consensus statement:  Guidelines for Management of Incidental Pulmonary Nodules Detected on CT Images:From the Fleischner Society 2017; published online before print (10.1148/radiol.[id number]). 3. Additional findings include mild-to-moderate centrilobular emphysema, aortic atherosclerosis and 2 vessel coronary atherosclerosis.     Electronically Signed   By: Selinda DELENA Blue M.D.   On: 02/16/2016 14:14    OV 08/31/2016  Chief Complaint  Patient presents with   Follow-up    HRCT was never scheduled. Pt states that the cough is still present >> slightly improved since last OV. Pt states that she feels her allergies has gotten it ramped up again.    Follow-up multifactorial cough associated with autoimmune antibody positive, interstitial lung disease and emphysema previous history of smoking  After last visit in August 2017 she had high resolution CT chest in December 2017 that showed persistence of ILD. I personally visualized the CT chest. He comes for follow-up. She tells me that since August 2017 she has insidious onset of shortness of breath and is progressively worse. It is mild and present only on inclines is not therefore tenderness. In terms of her cough is persistent. It is associated with significant sinus drainage that she thinks is allergy related. She constantly clears her throat. Lab review shows autoimmune antibody positive in August 2017 with scleroderma antibody slightly elevated at 6.4. I referred her to rheumatology but she does not remember this and she did not make this follow-up. She also tells me that she definitely has a long history of [name] phenomena in her fingers but no one has ever formally diagnosed with connective tissu  e disease.  OV 11/28/2016  Chief Complaint  Patient presents with   Follow-up    Pt here after PFT. Pt states her breathing is uchanged since last OV. Pt [name]/o dry cough.- pt states this has slightly improved since last OV. Pt denies CP/tightness and f/[name]/s.      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 Since then she has seen rheumatologis Dr. JONETTA. I reviewd the notes and also discussed with the patient wa a good understading of what s going on. She tells me that some of the lupus antibodies have been positive correlating with Raynaud. Patient is on beta blocker for atrial fbrillation and it is the preference by the rheumatologist Dr. D that she switc to calcium  channel blocker. She has a follow-up appointment pending with her electrophysiologist Dr. Fernande.  She is scleroderma antibody positive but according to her rheumtolgist she does not have dermatologic  Manifestation of the disese. 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 She pulm  function test today and this is stable. In terms of arrest or symptoms she stable. The cough is actually improved. But she does get dyspneic walking up stairs especially a few flights. She does not wnt her to pulmonary habilitation because se exrcises on the treadmill although she does not monitor her saturations.   reports that she quit smoking about 19 years ago. She has a 42.00 pack-year smoking history. she has never used smokeless tobacco.   OV 06/12/2017  Chief Complaint  Patient presents with   Follow-up    Pt states that she has been doing good since last visit. States that she has a tickle cough that is sporadic and has SOB that she states is when she climbs 2 flights of stairs. Denies any CP.    Follow-up combined emphysema was interstitial lung disease [autoimmune undifferentiated connective tissue disease interstitial lung disease]. Autoimmune features from May 2018 rheumatology noted with Dr. JONETTA: Raynaud phenomenon positive without gangrene, positive anti-cardiolipin and positive IgM and SCL-70 positive without clinical features of  scleroderma    Last seen June 2018. Since then she's stable. Overall she tells me that only problem is a tickle in her throat and slight cough. This is up and down depending on the pollen exposure she gets. She gets dyspneic for climbing few to several flights of stairs. This is unchanged. She did have full function test today and that shows mild significant worsening though overall gradient is only mild.. However she's not feeling this. She has appointment with Dr. JONETTA  pending    OV 07/18/2017  Chief Complaint  Patient presents with   Follow-up    HRCT done 07/03/17.  Pt states she is the same as she was at last visit. SOB with exertion.    Follow-up combined emphysema was interstitial lung disease [autoimmune undifferentiated connective tissue disease interstitial lung disease]. Autoimmune features from May 2018 rheumatology noted with Dr. JONETTA: Raynaud phenomenon positive without gangrene, positive anti-cardiolipin and positive IgM and SCL-70 positive without clinical features of scleroderma    [name] presents for follow-up.  She is here to discuss the results of a high-resolution CT scan of the chest.  The scan was reviewed by Dr. Jeremy thoracic radiology who feels that patient has probable UIP pattern that is definitely progressive compared to August 2017 although there is no comment about progression since June 2018 CT scan.  The pulmonary nodules itself are stable since August 2017 and are likely benign.  The change in the CT scan which is mild progression pulmonary fibrosis corresponds with the pulmonary function test that shows mild progression.  She is now here with her daughter Rocky and her husband.  She also states that 1 of her other daughters has rheumatoid arthritis and is on TNF alpha blockade.  All her 3 daughters have hemo-chromatosis    OV 08/29/2017  Chief Complaint  Patient presents with   Follow-up    Pt states she has been doing good since last visit. Pt  recently went to [place]  and had some mild problems with SOB due to heat.  Pt was prescribed cellcept  and bactrim  but has not yet started meds due to questions with other meds she is taking.    83 year old female with ILD secondary to autoimmune disease clinically suspicious of scleroderma but also previous history of hemochromatosis with family history of hemochromatosis.  She is here with her husband.  This visit is only a discussion visit.  Because she has many questions about starting CellCept  before she actually does.  We did hepatitis virus panel, QuantiFERON gold, G6PD and all this is normal.  Lab work is normal.  I checked with [facility] pharmacist about interactions and was cleared for her to start CellCept .  The only recommendation was for patient to come off Prilosec because of reduced effect of CellCept .  Patient questions revolved around CellCept : She does not want to come off PPI because of hiatal hernia and has had bad acid  reflux.  So I would not Isaiah Medley asking for any alternative PPI.  In addition she states she takes multiple multivitamins including Biotene, vitamin D3, vitamin D , vitamin B12 and folic acid .  She also takes Allegra occasionally.  She wants to make sure it is all okay with CellCept .  She has not had a flu shot today and she is asking if she should have it.  She has never had flu shot before.  She is also wondering about anticoagulation in the setting of CellCept  and thyroid  issues in the setting of Bactrim .  In terms of her genetics: Initially I corresponded with our local geneticist who thought she might be better served at [facility] but the [facility] genetics person wrote to me saying that patient should first be seen by [facility] rheumatology and hematology.  Patient's not so sure she wants to see the subspecialist.  She will speak to Dr. Freddie her local hematologist to see if it is worthwhile seeing the hematology department at [facility].   Lungs/Pleura: Moderate centrilobular emphysema. Mild basilar predominant subpleural reticulation and ground-glass, increased from 02/16/2016. No traction bronchiectasis/ bronchiolectasis, architectural distortion or honeycombing. Side-by-side nodules in the medial left lower lobe measure up to 6 mm (series 3, image 95), unchanged from 02/16/2016 and considered benign. No air trapping. No pleural fluid. Airway is unremarkable.  IMPRESSION: 1. Mild progression in basilar predominant subpleural fibrosis from 02/16/2016, raising suspicion for usual interstitial pneumonitis. 2. Aortic atherosclerosis (ICD10-170.0). Moderate coronary artery calcification. 3.  Emphysema (ICD10-J43.9).     Electronically Signed   By: Newell Eke M.D.   On: 07/03/2017 09:54    OV 09/19/2017  Chief Complaint  Patient presents with   Follow-up    Pt states she has been doing good. Currently on cellcept  and bactrim  and has been doing well on it once put back on omeprazole. Pt still has the cough and sometimes when trying to take a deep breath can't fully get all O2 out.   Margee Trentham returns for follow-up.  This is for autoimmune interstitial lung disease.  She was started on CellCept  recently.  She is here to follow-up for therapeutic drug monitoring.  Because she was on omeprazole and we were initiating CellCept  with Bactrim  we asked her to hold off on omeprazole because the omeprazole impairs drug levels of CellCept .  However she tried it for a week and her acid reflux was significant despite ranitidine so she went back on omeprazole.  Pharmacist Isaiah Medley is advising changing the CellCept  to equivaekbtdose of Myfortic . So far tolerating cellcept /pred/bactrimm well. Has seen dR G in heme and on observation. Had PFT today on cellcept /pred/bactrim  and is better! Continue siwht spriva  OV 10/31/2017  . Chief Complaint  Patient presents with   Follow-up    Pt currently taking  myfortic  and states she has been well on that. Pt states she has some mild nausea and has some problems with sleeping at night. SOB is stable,mild coughing. Denies any CP.    Follow-up progressive interstitial lung disease with SCL-70 antibody positive and Raynard Follow-up associated emphysema.  Last visit September 19, 2017.  At that time she had intolerance to the CellCept  therefore we switched her to Myfortic .  She is here to report for follow-up about this.  She is tolerating Myfortic  just well.  She had some transient insomnia for a few days last week but this resolved.  She only has mild intermittent occasional nausea with the Myfortic  but otherwise is  tolerating it really well at the low-dose of 360 mg twice daily.  She continues on prednisone  10 mg daily and Bactrim  3 times a week.  She is due for blood work today.  There are no other new issues.  She continues on Spiriva  for her emphysema   OV 02/01/2018  Chief Complaint  Patient presents with   Follow-up    PFT performed today.  Pt stated other than having shingles earlier, things have been doing good for her. Pt states SOB is stable and states she still has an occ cough.    Follow-up progressive interstitial lung disease with SCL-70 antibody positive and Raynud - IPAF Follow-up associated emphysema   URA YINGLING presents for routine follow-up with interstitial lung disease clinic. She has the above problems. In the interim she tells me she had developed shingles on the right neck but this is resolved. This is despite having zoster vaccine in 2012. She was yet to have the bnnew  shingles vaccine.in any event currently she is feeling well. She only has occasional post residual neuropathic pain. In terms of her shortness of breath it is stable and minimal. Walking desaturation test shows stability. Pulmicort function test below show stability. She is tolerating the mycophenolate  myfortic  well at 360 mg twice daily and prednisone  10 mg  daily and Bactrim  for PCP prophylaxis. Overall she feels stable and feels that it is best medicines on touch in terms of dosing change  OV 08/14/2018  Subjective:  Patient ID: [name], female , DOB: Oct 01, 1940 , age 36 y.o. , MRN: [id number] , ADDRESS: [address] [place] [address]   08/14/2018 -   Chief Complaint  Patient presents with   Follow-up    PFT performed today.  Pt states she is slowly getting better after her recent pna. States she still has some issues with SOB and an occ cough. Pt also states that she feels like she has a weight on her chest at times.    Follow-up progressive interstitial lung disease with SCL-70 antibody positive and Raynud - IPAF - on myfortic  since march 2019 with pred 10 and bactrim   Follow-up associated emphysema - on spiriva     HPIfortic CLORIS FLIPPO 83 y.o. -returns for 59-month follow-up of her interstitial lung disease associated with emphysema.  She presents with her middle daughter Rocky who is met me before.  She continues on Myfortic  and prednisone  10 mg and Bactrim  prophylaxis.  She continues on Spiriva .  She had an episode of atrial fibrillation according to review of the chart and my on history with in the middle of January 2020.  At this time she had a chest x-ray that showed asymptomatic upper lobe pneumonia.  I personally visualized the chest x-ray and confirmed the findings.  Antibiotic was not prescribed.  Then I follow this up with a CT scan of the chest [we canceled her elective colonoscopy].  CT chest showed improved pneumonia findings.  I prescribed even though she was feeling well cephalexin  for a week.  She says she took it.  She continues to feel well except she has baseline amount of symptoms that is documented below.  Of note the high-resolution CT chest showed an improvement in ILD compared to a year ago.  In fact her pulmonary function tests are also showing stability/improvement as documented below.  She is very  happy about this outcome with immunosuppression .  However, she is a little bit unsure why she is symptomatic in  terms of shortness of breath and cough.  She is very concerned that her fibrosis is getting worse.  She wants to have closer follow-up than every 6 months.   IMPRESSION: HRCT Jan 2020 1. Vaguely bandlike patchy consolidation and ground-glass opacity at the periphery of the right upper and superior segment right lower lobes, new since 07/03/2017 chest CT, improving compared to 07/10/2018 chest radiograph, favoring resolving pneumonia with evolving postinfectious scarring. 2. Previously described basilar predominant subpleural reticulation and ground-glass attenuation is minimal and appears decreased in the interval. No bronchiectasis or honeycombing. Findings may represent an interstitial lung disease such as nonspecific interstitial pneumonia (NSIP) or less likely early usual interstitial pneumonia (UIP). Findings are indeterminate for UIP per consensus guidelines: Diagnosis of Idiopathic Pulmonary Fibrosis: An Official ATS/ERS/JRS/ALAT Clinical Practice Guideline. Am JINNY Honey Crit Care Med Vol 198, Iss 5, [phone], Feb 25 2017. 3. Moderate centrilobular emphysema with mild diffuse bronchial wall thickening, suggesting COPD. 4. Two-vessel coronary atherosclerosis. 5. Small hiatal hernia.   Aortic Atherosclerosis (ICD10-I70.0) and Emphysema (ICD10-J43.9). ROS - per HPI    OV 04/22/2019  Subjective:  Patient ID: [name], female , DOB: Mar 06, 1941 , age 50 y.o. , MRN: [id number] , ADDRESS: [address] [place] [address]   Follow-up slowly progressive interstitial lung disease with SCL-70 antibody positive and Raynud - IPAF - on myfortic  since march 2019 with pred 5 and bactrim  3x/week  Follow-up associated emphysema - on spiriva    04/22/2019 -   Chief Complaint  Patient presents with   Follow-up    Patient reports that her breathing is doing well  at this time.      HPI LYNESHA BANGO 83 y.o. -presents for follow-up.  Last seen by myself in early part of 2020 before the pandemic.  At that time CT chest suggested possible progression.  She saw a nurse practitioner in the interim after the onset of the pandemic.  She was anxious about the progression of her ILD so she asked for a CT scan in less than 1 year.  A CT scan of the chest was done in September 2020.  This is deemed stable since early part of the year but with possible progression versus stability since January 2019.  In terms of her symptoms she continues to be stable with very minimal shortness of breath and cough.  Her pulmonary function test below also shows stability.  Her walking desaturation test also shows stability.  She is tolerating her Myfortic , prednisone  and 3 times a week of Bactrim  just fine.  There are no side effects.  Last blood work was in August 2020 and this was reviewed.  We discussed the possibility of switching to antifibrotic nintedanib instead of taking immunosuppressants.  The rational for this would be it is a safer drug with less long-term side effect such as lack of immunosuppression.  However she feels her current regimen is working well for her.  She also feels that she would benefit more from a immunosuppressive drug because of her autoimmune antibodies and therefore she is more inclined to continue with her current immunosuppressive regimen of prednisone  5 mg/day and Myfortic  and Bactrim  3 times a week.        OV 07/04/2019  Subjective:  Patient ID: [name], female , DOB: June 07, 1941 , age 73 y.o. , MRN: [id number] , ADDRESS: [address] [place] [address]   Follow-up mildy progressive - stable interstitial lung disease with SCL-70 antibody positive and Raynud -  IPAF - on myfortic  since march 2019 with pred 5 and bactrim  3x/week  Follow-up associated emphysema  Follow-up Raynard  Has associated hemochromatosis  Has  associated atrial fibrillation on Xarelto    07/04/2019 -   Chief Complaint  Patient presents with   Follow-up    Pt states she has been doing well since last visit and denies any complaints.     HPI [name] 83 y.o. -last visit was in October 2020.  At that time she was doing well.  Given her continued immunosuppression with prednisone  and Myfortic  we discussed the possibility of switching the Myfortic  to the nintedanib but understanding that there uncertainties whether this is superior approach and if she could end up with temporary side effects of the nintedanib.  Based on that she decided to continue with prednisone , Myfortic  and Bactrim  as she was doing because things are working well.  However she later called in and wanted this change to nintedanib.  We did this change to nintedanib but then her co-pay ended up being over 500+ dollars and then she started also getting worried about the side effects with nintedanib particularly GI.  She is also on Xarelto  for atrial fibrillation.  Therefore she made this visit ahead of schedule to discuss the possibility of making the switch.  I understand from her that it will be 500+ dollars every month with nintedanib as a co-pay.  In addition she is worried about the superiority of taking nintedanib.  She fully understands that Myfortic  over length of time can carry cancer risk and immunosuppression risk but at this point in time she feels well and she is tolerating it well.  She also understands that nintedanib can carry temporary GI side effects.  She also understands that it may not be a superior approach.  She understands it could be an equivalent approach and definitely lower immunosuppression.  Her main systemic immunosuppressive features are lung disease and Raynaud.  She is worried this might flareup.  In terms of her symptoms there is stable as documented below.  Her walking desaturation test is also stable.   Last pulmonary function test  was in August r 2020 Last CT scan of the chest was in September 2020 Last blood work October 2020 Last echocardiogram 2012.  ROS - per HPI  OV 10/22/2019  Subjective:  Patient ID: [name], female , DOB: 1940-08-29 , age 51 y.o. , MRN: [id number] , ADDRESS: [address] [place] [address]   Follow-up mildy progressive - stable interstitial lung disease with SCL-70 antibody positive and Raynud - IPAF - on myfortic  since march 2019 with pred 5 and bactrim  3x/week  Follow-up associated emphysema  Follow-up Raynaud  Has associated hemochromatosis  Has associated atrial fibrillation on Xarelto     10/22/2019 -   Chief Complaint  Patient presents with   Follow-up    PFT performed today. Pt states she has been doing good since last visit. Denies any complaints with breathing.     HPI [name] 83 y.o. -returns for routine follow-up.  Overall doing well.  She feels shortness of breath is stable.  She feels she is tolerating Myfortic  and prednisone  fine no new interim issues she has had a Covid vaccine.  She continues on high risk immunosuppressive therapy.  There is some mild diarrhea with the Myfortic  but it is tolerable.  The shortness of breath scale is stable walking desaturation test is stable.  Her pulmonary function test shows stable FVC with  a possible reduction in diffusion capacity.  I do not know what to make of this at this point in time.  It could be a technical issue because she is feeling stable.  She did have echocardiogram that showed grade 2 diastolic dysfunction.   She continues on her Spiriva  for associated emphysema.  ROS - per HPI     OV 03/03/2020   Subjective:  Patient ID: [name], female , DOB: 1940-07-06, age 67 y.o. years. , MRN: [id number],  ADDRESS: [address] 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 -returns for follow-up.  Overall she is doing well.  However in the last 1-2 months she says she is slightly more short of breath than usual.  She is also having slight increase in dry cough.  Based on her subjective symptom questionnaires it seems the cough itself is stable but she is definitely saying it is worse.  She is seeing the dyspnea is overall stable compared to the cough but looking at the symptom questionnaire is maybe it is a little worse.  Is hard to discern.  It has been approximately 1 year since she had a high-resolution CT chest.  She is wondering if she can have this repeated.  She is on immunosuppression.  She is also noticing -that at home her pulse oximetry on her finger varies a lot [she does have [name]]) and sometimes can be as low as 88% - 93%.  She does acknowledge that the circulation in her hands is not as good.  Pulse ox here at rest was normal.  She had pulmonary function test that shows stability.-With the FVC although I am not sure what to make of the DLCO.  She has had a Covid booster vaccine  Last echocardiogram January 2021 with mild diastolic dysfunction    IMPRESSION: 1. Very mild basilar predominant subpleural reticulation and ground-glass, similar to minimally progressive from 07/03/2017. Nonspecific interstitial pneumonitis is favored. Findings are indeterminate for UIP per consensus guidelines: Diagnosis of Idiopathic Pulmonary Fibrosis: An Official ATS/ERS/JRS/ALAT Clinical Practice Guideline. Am JINNY Honey Crit Care Med Vol 198, Iss 5, [phone], Feb 25 2017. 2. Aortic atherosclerosis (ICD10-170.0). Coronary artery calcification. 3. Enlarged pulmonic trunk, indicative of pulmonary arterial hypertension. 4.  Emphysema (ICD10-J43.9).     Electronically Signed   By: Newell Eke M.D.   On: 03/20/2019 14:47  Echocardiogram -January 2021: Shows grade 2 diastolic dysfunction but otherwise unremarkable.   ROS - per HPI  HPI [name] 83 y.o. -last visit was in September 2021.  She continues to do well overall.  Shortness of breath symptom question is stable.  Walking desaturation test is stable.  She is compliant with her medications.  In September 2021 there is concern that her ILD was getting worse because of change in PFT.  So we did a CT scan of the chest radiologist reported this to be stable since 2017.  Most recently she  had blood work in December 2021 chemistry panel is normal and I reviewed it.  CBC panel is also normal with stable mild anemia hemoglobin 12.4 g%.  She is up-to-date with her COVID vaccine including booster.  She had pulmonary function test today that showed some fluctuation.  Her main concern is that she is coughing more  In terms of her cough it is dry and has a barking quality to it.  Or laryngeal quality to it.  It is mostly at day and night but never wakes her up at night.  When she has it at night it is only when she wakes up.  Talking makes it worse.  Laughing makes it worse.  Drinking water makes it better.  She thinks it is progressive.  Symptom score showed the cough is getting worse and deep.  She has no fever or chills.  No desaturations.  We discussed the possibility of opportunistic infection but she rightfully questant if it could be opportunistic infection if everything else is stable.  There is no wheezing.  She is wondering if it could be because of allergies.  She has seasonal allergies.  Review of the records indicate this has not been  investigated.  She is willing to have an investigation for this.  We discussed the possibility of cough neuropathy.  She recollects has had gabapentin  before when she had shingles.  She did not have any side effects from it.     CT Chest data sept 2021  TECHNIQUE: Multidetector CT imaging of the chest was performed following the standard protocol without intravenous contrast. High resolution imaging of the lungs, as well as inspiratory and expiratory imaging, was performed.   COMPARISON:  09/09/2019, 03/20/2019, 07/03/2017, 12/06/2016, 06/15/2016   FINDINGS: Cardiovascular: Aortic atherosclerosis. Normal heart size. Three-vessel coronary artery calcifications. No pericardial effusion.   Mediastinum/Nodes: No enlarged mediastinal, hilar, or axillary lymph nodes. Thyroid  gland, trachea, and esophagus demonstrate no significant findings.   Lungs/Pleura: Moderate centrilobular emphysema. No significant interval change in subtle irregular peripheral interstitial opacity predominantly seen in the bilateral lung bases, although also noted in nondependent portions of the right middle lobe and lingula. Mild lobular air trapping on expiratory phase imaging. Stable, definitively benign and faintly rim calcified 6 mm nodule of the dependent left lower lobe (series 5, image 86). No pleural effusion or pneumothorax.   Upper Abdomen: No acute abnormality.   Musculoskeletal: No chest wall mass or suspicious bone lesions identified.   IMPRESSION: 1. No significant interval change in subtle irregular peripheral interstitial opacity predominantly seen in the bilateral lung bases, although also noted in nondependent portions of the right middle lobe and lingula. There is no appreciable change in these findings on multiple prior examinations dating back to 2017. Bland post infectious or inflammatory scarring is favored, although minimal fibrotic interstitial lung disease is not excluded and  if characterized by ATS pulmonary fibrosis criteria, findings are consistent with an early indeterminate for UIP pattern. Consider ongoing follow-up CT to assess for stability of fibrotic findings. Findings are indeterminate for UIP per consensus guidelines: Diagnosis of Idiopathic Pulmonary Fibrosis: An Official ATS/ERS/JRS/ALAT Clinical Practice Guideline. Am JINNY Honey Crit Care Med Vol 198, Iss 5, [phone], Feb 25 2017. 2. Emphysema (ICD10-J43.9). 3. Coronary artery disease.  Aortic Atherosclerosis (ICD10-I70.0).     Electronically Signed   By: Marolyn Jaksch M.D.   On: 03/16/2020 14:12  OV 02/23/2021  Subjective:  Patient ID: [name], female , DOB: 1941/02/01 , age 59 y.o. ,  MRN: [id number] , ADDRESS: [address] [place] [address] PCP Cleotilde Planas, MD Patient Care Team: Cleotilde Planas, MD as PCP - General (Family Medicine) Fernande Elspeth BROCKS, MD as PCP - Cardiology (Cardiology) Freddie Lynwood HERO, MD as Consulting Physician (Hematology) Fernande Elspeth BROCKS, MD as Consulting Physician (Cardiology) Luis Purchase, MD as Consulting Physician (Gastroenterology)  This Provider for this visit: Treatment Team:  Attending Provider: Geronimo Amel, MD    02/23/2021 -   Chief Complaint  Patient presents with   Follow-up    Pt states she has been doing okay since last visit and denies any complaints.    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  Follow-up associated emphysema  Follow-up Raynaud  Has associated hemochromatosis  Has associated atrial fibrillation on Xarelto   Mild associated diastolic dysfunction and echo January 2021  HPI [name] 83 y.o. -returns for her 41-month follow-up.  Last visit with me was in February 2022.  Is   She continues on Myfortic  prednisone  and Bactrim .  We  held off on nintedanib given stability.  She did have spirometry in February 2022.?  There is a slight decline but she feels stable.  Symptom scores are stable.  Walking desaturation test is below and is stable versus a three-point drop that is?  Significant..  Last CT scan September 2021.  But clinically she feels stable.  We held off on nintedanib last visit because of stability.       OV 07/23/2021  Subjective:  Patient ID: [name], female , DOB: 1941-01-08 , age 50 y.o. , MRN: [id number] , ADDRESS: [address] [place] [address] PCP Cleotilde Planas, MD Patient Care Team: Cleotilde Planas, MD as PCP - General (Family Medicine) Fernande Elspeth BROCKS, MD as PCP - Cardiology (Cardiology) Freddie Lynwood HERO, MD as Consulting Physician (Hematology) Fernande Elspeth BROCKS, MD as Consulting Physician (Cardiology) Luis Purchase, MD as Consulting Physician (Gastroenterology)  This Provider for this visit: Treatment Team:  Attending Provider: Geronimo Amel, MD    07/23/2021 -   Chief Complaint  Patient presents with   Follow-up    Pt states she has been doing okay and denies any complaints.    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  Follow-up associated emphysema  Follow-up Raynaud  Has associated hemochromatosis  Has associated atrial fibrillation on Xarelto   Mild associated diastolic dysfunction and echo January 2021  HPI [name] 83 y.o. -returns for 62-month follow-up.  Last seen in August 2022.  She feels since then dyspnea slightly worse.  This is reflected in the symptom score below.  In December 2022 he did have COVID and she recovered from it.  She does not think the decline is because of the COVID.  She continues on her prednisone  Bactrim  and Myfortic .  Her last CT scan of the chest was in  September 2021.  Last echocardiogram was in January 2021.  Last pulmonary function test was almost 1 year ago in February 2022.  She is wondering about repeat investigations.  She is concerned about worsening ILD.    Of note she just turned 83 years old.  OV 09/09/2021  Subjective:  Patient ID: Tara JINNY  Welford, female , DOB: 17-Nov-1940 , age 66 y.o. , MRN: [id number] , ADDRESS: [address] [place] [address] PCP Cleotilde Planas, MD Patient Care Team: Cleotilde Planas, MD as PCP - General (Family Medicine) Fernande Elspeth BROCKS, MD as PCP - Cardiology (Cardiology) Freddie Lynwood HERO, MD as Consulting Physician (Hematology) Fernande Elspeth BROCKS, MD as Consulting Physician (Cardiology) Luis Purchase, MD as Consulting Physician (Gastroenterology)  This Provider for this visit: Treatment Team:  Attending Provider: Geronimo Amel, MD    09/09/2021 -   Chief Complaint  Patient presents with   Follow-up    PFT performed today. Pt states she has been doing okay and denies any complaints.    HPI [name] 83 y.o. -returns for follow-up.  At this point in time she is feeling stable.  She had work-up to see if her ILD is worse or she has developed pulmonary hypertension.  CT scan shows stability since 2021.  Her pulmonary function test shows a 3% decline in DLCO.  On an average she has 3% decline per year over the last 4 years.  At this point in time she is feeling well.  No side effects from Myfortic .  She asked about options if she were to get worse.  We discussed about nintedanib.  Also indicated there is an injection [tocilizumab] but at this point in time she is fine with the choices.  She does understand Myfortic  is immunosuppressant and there is also cancer risk.  She is decided to continue.  She also has mild emphysema for which she is on Spiriva  and it is stable.  I gave her a book called Thursdays with Sherwood written by the patient who has pulmonary fibrosis and scleroderma.  She  is going to read it.  We did discuss briefly clinical trials as a care option and she is interested  -No clearing of the throat.  -She has seen rheumatology recently.  She continues to have [name].  They have a nitroglycerin .  She does not want to try it.  We discussed about using heart warmers.  She is going to buy this   CT Chest data  XR [name] NO REPORT  Result Date: 09/07/2021 Please see Notes tab for imaging impression.      OV 04/21/2022  Subjective:  Patient ID: Tara JINNY Welford, female , DOB: 1940/11/09 , age 83 y.o. , MRN: [id number] , ADDRESS: [address] [place] [address] PCP Cleotilde Planas, MD Patient Care Team: Cleotilde Planas, MD as PCP - General (Family Medicine) Fernande Elspeth BROCKS, MD as PCP - Cardiology (Cardiology) Freddie Lynwood HERO, MD as Consulting Physician (Hematology) Fernande Elspeth BROCKS, MD as Consulting Physician (Cardiology) Luis Purchase, MD as Consulting Physician (Gastroenterology)  This Provider for this visit: Treatment Team:  Attending Provider: Geronimo Amel, MD  04/21/2022 -   Chief Complaint  Patient presents with   Follow-up    6 Follow-up PT states no changes, DOE (upstairs, fast paced walking)     HPI [name] 83 y.o. -returns for follow-up.  She tells me that since last seeing me since the summer or so she is slightly more short of breath especially when she climbs stairs but overall symptom score is stable.  In September 2023 she went on vacation to [place]  and came back with her girlfriends and she had a great time but she did notice more shortness of breath than usual.  She attributed this to heavier exertion but nevertheless with stairs she is  finding herself more short of breath.  She is compliant with Spiriva  for associated emphysema Myfortic  Bactrim  and daily prednisone .  2 days ago she saw Cheryl Birmingham rheumatology PA for her scleroderma and has been deemed stable  Pulmonary function test shows  slight reduction in FVC over time but still within normal limits but the DLCO is dramatically low.  There is no anemia correction on the DLCO.  When I did a rough anemia correction it appears that DLCO might be okay.  Of asked for a formal correction on the DLCO for anemia.  Given her anemia which appears to be relatively more pronounced compared to 05/18/2023greater than 1.5 g% drop she has been referred by rheumatology to hematology because of history of hemochromatosis.  Her last echo and CT scan of the chest were earlier this year  We discussed about the 3 different respiratory vaccines.  Strongly recommended COVID mRNA because she is immunocompromised.  She did have outpatient COVID December 2022 but it was a different strain.  Also recommend high-dose flu shot which she will get today and we discussed RSV vaccine.     PFT  OV 05/26/2022  Subjective:  Patient ID: [name], female , DOB: 04-Mar-1941 , age 58 y.o. , MRN: [id number] , ADDRESS: [address] [place] [address] PCP Cleotilde Planas, MD Patient Care Team: Cleotilde Planas, MD as PCP - General (Family Medicine) Fernande Elspeth BROCKS, MD as PCP - Cardiology (Cardiology) Fernande Elspeth BROCKS, MD as Consulting Physician (Cardiology) Luis Purchase, MD as Consulting Physician (Gastroenterology) Federico Norleen ONEIDA MADISON, MD as Consulting Physician (Hematology and Oncology)  This Provider for this visit: Treatment Team:  Attending Provider: Geronimo Amel, MD    05/26/2022 -   Chief Complaint  Patient presents with   Acute Visit    Pt started having complaints of dry cough, wheezing, and chest discomfort which began 1 week ago. Has done multiple covid tests which all have come back negative. States she is now coughing up phlegm and also has complaints of a headache and has no energy.   HPI MIKALYN HERMIDA 83 y.o. -she is making an acute visit.  She presents with her daughter Rocky who I have met before.  She was on a  [place] cruise out of [place] between May 14, 2022 and Saturday after Thanksgiving May 20, 2022.  On Friday after Thanksgiving she suddenly took ill.  Symptoms got worse on Saturday when she returned.  She describes laryngitis ear pain cough headaches sore throat runny nose brain fog increased fatigue and sleeping.  COVID test x 2 was negative.  She called our office and we gave her antibiotic and prednisone  because the sputum is now yellow.  She is taking this but she wanted an acute visit today.  Some symptoms are better.  She is saying that her headache is better sore throat is better congestion is better and the tiredness is better but according to the daughter she still sleeping a lot.  Her cough nature has changed and she is got some yellow sputum now.  So some symptoms are not better.  Walking desaturation test is stable but she only walked 2 laps and she stopped because of fatigue.  The cough is particularly worse at night.  She does not want any cough medication  OV 07/08/2022  Subjective:  Patient ID: [name], female , DOB: 1940-11-01 , age 67 y.o. , MRN: [id number] , ADDRESS: [address] [place] [address] PCP Cleotilde Planas, MD Patient Care Team: Cleotilde Planas, MD as PCP - General (Family Medicine) Fernande Elspeth BROCKS, MD as PCP - Cardiology (Cardiology) Fernande Elspeth BROCKS, MD as Consulting Physician (Cardiology) Luis Purchase, MD as Consulting Physician (Gastroenterology) Federico Norleen ONEIDA MADISON, MD as Consulting Physician (Hematology and Oncology)  This Provider for this visit: Treatment Team:  Attending Provider: Geronimo Amel, MD     07/08/2022 -   Chief Complaint  Patient presents with   Follow-up    Spiro/dlco done today. She states had a hospitalization 06/20/22 with compression fx. Her cough is about the same- occ prod and has to clear throat but does not cough out the sputum. Breathing is at baseline.      HPI [name] 83 y.o.  -returns for follow-up.  I saw her just after Thanksgiving 2023 when she had come back from a cruise and was having respiratory symptoms of acute bronchitis.  She at that time was through her doxycycline  and prednisone  taper she had significant cough.  It appears that after that visit her cough continued unabated.  She was coughing a lot at night.  Then she ended up in December 2023 according to history and review of the external records in the hospital with L2 spinal compression fracture.  She believes the cough caused it.  I did explain to her that I have clinically not seen lumbar spinal compression fracture from cough [I subsequently discussed with Dr. Ozell America about cough expert and he has seen a few patients with lumbar spine compression fracture with severe cough].  In the hospital hemoglobin was 12.3 g%.  And a creatinine was 0.8 mg percent.  She is now back home she is improving.  She is status post kyphoplasty 06/30/2022.  She still has a cough she says she is 80% better compared to when she saw me in November 2023.  She still has some nighttime cough.  She is willing to try some Tessalon  Perles all she is tried so far is Delsym cough syrup.  She continues on her chronic immunosuppressive medications.  At the last visit I ordered an echocardiogram: This is currently pending and is going to be done soon  At the last visit I ordered high-resolution CT chest but in the hospital she did have a CT angiogram: Mild ILD is reported although this is a contrasted CT.  This was done on 06/20/2022.  I personally visualized and independently agree with the findings  She did a pulmonary function test today: This is stable.     OV 11/24/2022  Subjective:  Patient ID: [name], female , DOB: 28-Dec-1940 , age 5 y.o. , MRN: [id number] , ADDRESS: [address] [place] [address] PCP Cleotilde Planas, MD Patient Care Team: Cleotilde Planas, MD as PCP - General (Family Medicine) Fernande Elspeth BROCKS, MD as PCP - Cardiology (Cardiology) Fernande Elspeth BROCKS, MD as Consulting Physician (Cardiology) Luis Purchase, MD as Consulting Physician (Gastroenterology) Federico Norleen ONEIDA MADISON, MD as Consulting Physician (Hematology and Oncology)  This Provider for this visit: Treatment Team:  Attending Provider: Geronimo Amel, MD  11/24/2022 -   Chief Complaint  Patient presents with   Follow-up    Annual f/up     HPI [name] 83 y.o. -returns for follow-up.  She says her shortness of breath itself is stable.  Exercise size sit/stand desaturation test today stable.  No new medical problems.  No emergency room visits.  However on general dynamics  day weekend she did go to the emergency room and had an infection of the right small toe.  She is on doxycycline .  Other than that she is well.  But the main issue is that her chronic severe cough persist.  This cough got worse in November 2023/January 2024 after a cruise and viral infection and spinal fracture.  This continues unabated.  Wakes her up at night.  As a tickling of the chest there is no wheezing there is no fever there is a dry cough.  Review of the labs indicate she has never had allergy panel test.  Her eosinophils are slightly elevated.  Her last CT scan of the chest was in December 2023 but was an angiogram.  Showed mild ILD.   feNO could NOT do  OV 12/15/2022  Subjective:  Patient ID: [name], female , DOB: [birth year] , age 67 y.o. , MRN: [id number] , ADDRESS: [address] [place] [address] PCP Cleotilde Planas, MD Patient Care Team: Cleotilde Planas, MD as PCP - General (Family Medicine) Fernande Elspeth BROCKS, MD as PCP - Cardiology (Cardiology) Fernande Elspeth BROCKS, MD as Consulting Physician (Cardiology) Luis Purchase, MD as Consulting Physician (Gastroenterology) Federico Norleen ONEIDA MADISON, MD as Consulting Physician (Hematology and Oncology)  This Provider for this visit: Treatment Team:  Attending Provider: Geronimo Amel,  MD   12/15/2022 -   Chief Complaint  Patient presents with   Follow-up    F/up on PFT and blood work results   Type of visit: Video Virtual Visit Identification of patient AVERIANA CLOUATRE with [birth year] and MRN [id number] - 2 person identifier Risks: Risks, benefits, limitations of telephone visit explained. Patient understood and verbalized agreement to proceed Anyone else on call: just patien Patient location: her home This provider location: [address]; [place]; [place] [address]. [facility]. [phone]    HPI [name] 83 y.o. -returns for follow-up on this video visit.  This video visit is to discuss some of the evaluation we did for chronic cough.  I noticed that her cat dander is slightly positive but she tells me that she has no cats at all and no exposure to cats recently.  Otherwise allergy panel is negative.  She did have pulmonary function test yesterday and shows continued stability.  Overall in terms of her symptoms she tells me she stable she is continuing her medications.  She says the cough is slightly better.  The frequency is much less but when it comes to severe it is the same but overall she feels improved and stable.  She feels it was the allergy season that made her cough more.  We discussed the potential role of bronchoscopy with lavage rule out opportunistic infections which I said was low probability of finding alternative etiologies for cough.  We also discussed the option of waiting and watching.  She prefer the latter.  Will see her back in 6 months with a spirometry and DLCO.     OV 07/06/2023  Subjective:  Patient ID: [name], female , DOB: July 13, 1940 , age 36 y.o. , MRN: [id number] , ADDRESS: [address] 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  Has associated hemochromatosis  Has associated atrial fibrillation on Xarelto   Mild associated diastolic dysfunction in pst but not on echo  Feb 2023  LBP with L2 compression frcture status post kyphoplasty 1//24.- dec 2023 after viral inection during cruise nov 2023  07/06/2023 -   Chief Complaint  Patient presents with   Follow-up    Pft f/u, denies any concerns states she is the same.      HPI [name] 83 y.o. -returns for a 46-month follow-up.  She says she has been doing well from a respiratory standpoint.  She continues on Myfortic  without any side effects other than very occasional diarrhea.  She continues low-dose prednisone  5 mg and also Bactrim  prophylaxis.  For associated emphysema she continue Spiriva .  She says she safety labs not otherwise specified at primary care office last month.  She is reluctant to get them tested today.  Most recent labs in October 2024 liver function test and chemistries were normal within our system.  Therefore I have accepted that.  She states on June 25, 2023 she fell on a staircase and sustained a 7-10 cm longitudinal laceration on the distal third of the left shin.  It is nonhealing at this point.  There was a lot of oozing of blood.  Since yesterday she is at the wound center.  Did explain  to her along with aging and chronic prednisone  contributing to poor wound healing.  Asked if she is willing to taper the prednisone  slowly.  She is willing to give this a try.  She had pulmonary function test today and her lung function stable for the last 2 years.  Last CT scan was 2 years ago and she is willing to have another 1 at follow-up.  SYMPTOM SCALE - ILD 07/06/2023  Current weight   O2 use ra  Shortness of Breath 0 -> 5 scale with 5 being worst (score 6 If unable to do)  At rest 0  Simple tasks - showers, clothes change, eating, shaving 1  Household (dishes, doing bed, laundry) 1  Shopping 1  Walking level at own pace 1  Walking up Stairs 2  Total (30-36) Dyspnea Score 6  How bad is your cough? 3  How bad is your fatigue 2  How bad is nausea 1  How bad is vomiting?  0  How bad is diarrhea? 2  How bad is anxiety? 0  How bad is depression 0  Any chronic pain - if so where and how bad 1       PFT     Latest Ref Rng & Units 07/06/2023   11:26 AM 12/14/2022   10:27 AM 07/08/2022    2:57 PM 04/12/2022   10:27 AM 09/09/2021   10:46 AM 08/18/2020    2:49 PM 02/28/2020    4:16 PM  ILD indicators  FVC-Pre L 2.57  P 2.57  2.52  2.48  2.67  2.78  2.90   FVC-Predicted Pre % 102  P 102  99  96  103  105  108   DLCO uncorrected ml/min/mmHg 11.05  P 11.48  11.51  9.26  11.33  11.74  11.88   DLCO UNC %Pred % 60  P 62  61  49  60  62  63   DLCO Corrected ml/min/mmHg 11.05  P 11.78  11.93  9.26  12.46  11.74  11.88   DLCO COR %Pred % 60  P 63  64  49  66  62  63     P Preliminary result      LAB RESULTS last 96 hours No results found.  LAB RESULTS last 90 days Recent Results (from the past 2160 hours)  CBC with Differential (Cancer Center Only)     Status: Abnormal   Collection Time: 04/20/23  3:01 PM  Result Value Ref Range   WBC Count 6.9 4.0 - 10.5 K/uL   RBC 3.58 (L) 3.87 - 5.11 MIL/uL   Hemoglobin 13.0 12.0 - 15.0 g/dL   HCT 62.4 63.9 - 53.9 %   MCV 104.7 (H) 80.0 -  100.0 fL   MCH 36.3 (H) 26.0 - 34.0 pg   MCHC 34.7 30.0 - 36.0 g/dL   RDW 86.8 88.4 - 84.4 %   Platelet Count 193 150 - 400 K/uL   nRBC 0.0 0.0 - 0.2 %   Neutrophils Relative % 79 %   Neutro Abs 5.5 1.7 - 7.7 K/uL   Lymphocytes Relative 12 %   Lymphs Abs 0.8 0.7 - 4.0 K/uL   Monocytes Relative 6 %   Monocytes Absolute 0.4 0.1 - 1.0 K/uL   Eosinophils Relative 1 %   Eosinophils Absolute 0.0 0.0 - 0.5 K/uL   Basophils Relative 1 %   Basophils Absolute 0.1 0.0 - 0.1 K/uL   Immature Granulocytes 1 %   Abs Immature Granulocytes 0.04 0.00 - 0.07 K/uL    Comment: Performed at [facility], [address], [place], [place] [address]  CMP (Cancer Center only)     Status: Abnormal   Collection Time: 04/20/23  3:01 PM  Result Value Ref Range   Sodium 139 135 - 145 mmol/L   Potassium 4.2 3.5 - 5.1 mmol/L   Chloride 103 98 - 111 mmol/L   CO2 30 22 - 32 mmol/L   Glucose, Bld 105 (H) 70 - 99 mg/dL    Comment: Glucose reference range applies only to samples taken after fasting for at least 8 hours.   BUN 13 8 - 23 mg/dL   Creatinine 9.18 9.55 - 1.00 mg/dL   Calcium  9.5 8.9 - 10.3 mg/dL   Total Protein 6.6 6.5 - 8.1 g/dL   Albumin 4.3 3.5 - 5.0 g/dL   AST 19 15 - 41 U/L   ALT 18 0 - 44 U/L   Alkaline Phosphatase 50 38 - 126 U/L   Total Bilirubin 0.9 0.3 - 1.2 mg/dL   GFR, Estimated >39 >39 mL/min    Comment: (NOTE) Calculated using the CKD-EPI Creatinine Equation (2021)    Anion gap 6 5 - 15    Comment: Performed at [facility], [address] [place], [place] [address]  Iron and Iron Binding Capacity ([facility] only)     Status: Abnormal   Collection Time: 04/20/23  3:01 PM  Result Value Ref Range   Iron 261 (H) 28 - 170 ug/dL   TIBC 712 749 - 549 ug/dL   Saturation Ratios 91 (H) 10.4 - 31.8 %  UIBC 26 (L) 148 - 442 ug/dL    Comment: Performed at [facility], [address], [place], [place] [address]   Ferritin     Status: None   Collection Time: 04/20/23  3:01 PM  Result Value Ref Range   Ferritin 24 11 - 307 ng/mL    Comment: Performed at [facility], [address], [place], [place] [address]  Pulmonary function test     Status: None (Preliminary result)   Collection Time: 07/06/23 11:26 AM  Result Value Ref Range   FVC-Pre 2.57 L   FVC-%Pred-Pre 102 %   FEV1-Pre 1.83 L   FEV1-%Pred-Pre 98 %   FEV6-Pre 2.56 L   FEV6-%Pred-Pre 108 %   Pre FEV1/FVC ratio 71 %   FEV1FVC-%Pred-Pre 96 %   Pre FEV6/FVC Ratio 100 %   FEV6FVC-%Pred-Pre 105 %   FEF 25-75 Pre 1.11 L/sec   FEF2575-%Pred-Pre 83 %   DLCO unc 11.05 ml/min/mmHg   DLCO unc % pred 60 %   DLCO cor 11.05 ml/min/mmHg   DLCO cor % pred 60 %   DL/VA 7.29 ml/min/mmHg/L   DL/VA % pred 66 %         has a past medical history of Atrial fibrillation (HCC), Chest pain, Depression with anxiety, GERD (gastroesophageal reflux disease), Hemochromatosis, History of syncope, Hypothyroidism, and Osteoporosis.   reports that she quit smoking about 26 years ago. Her smoking use included cigarettes. She started smoking about 68 years ago. She has a 42 pack-year smoking history. She has never been exposed to tobacco smoke. She has never used smokeless tobacco.  Past Surgical History:  Procedure Laterality Date   IR KYPHO LUMBAR INC FX REDUCE BONE BX UNI/BIL CANNULATION INC/IMAGING  06/30/2022   IR KYPHO LUMBAR INC FX REDUCE BONE BX UNI/BIL CANNULATION INC/IMAGING  08/16/2022   MOUTH SURGERY     SHOULDER ARTHROSCOPY Left    TOTAL ABDOMINAL HYSTERECTOMY     VARICOSE VEIN SURGERY      No Known Allergies  Immunization History  Administered Date(s) Administered   Fluad Quad(high Dose 65+) 02/18/2019, 04/21/2022   Influenza Split 03/27/2019   Influenza, High Dose Seasonal PF 08/29/2017, 04/26/2018, 04/29/2020, 05/12/2021   PFIZER(Purple Top)SARS-COV-2 Vaccination 07/22/2019, 08/12/2019, 03/11/2020   Pneumococcal  Conjugate-13 06/12/2014   Pneumococcal Polysaccharide-23 06/27/2010, 04/25/2018   Tdap 03/16/2011, 07/05/2019   Zoster Recombinant(Shingrix) 04/08/2019   Zoster, Live 06/27/2010, 04/08/2019    Family History  Problem Relation Age of Onset   Stroke Mother    Heart disease Father 63   Hemochromatosis Brother    Breast cancer Neg Hx      Current Outpatient Medications:    acetaminophen  (TYLENOL ) 325 MG tablet, Take 650 mg by mouth every 6 (six) hours as needed for mild pain or headache., Disp: , Rfl:    apixaban  (ELIQUIS ) 5 MG TABS tablet, TAKE 1 TABLET BY MOUTH TWICE DAILY, Disp: 60 tablet, Rfl: 5   atorvastatin  (LIPITOR) 10 MG tablet, Take 10 mg by mouth daily., Disp: , Rfl:    cyanocobalamin  (VITAMIN B12) 500 MCG tablet, Take 500 mcg by mouth daily., Disp: , Rfl:    flecainide  (TAMBOCOR ) 100 MG tablet, TAKE 1 TABLET(100 MG) BY MOUTH TWICE DAILY, Disp: 180 tablet, Rfl: 3   folic acid  (FOLVITE ) 1 MG tablet, Take 1 mg by mouth daily., Disp: , Rfl:    HYDROcodone -acetaminophen  (NORCO/VICODIN) 5-325 MG tablet, Take 1 tablet by mouth every 6 (six) hours as needed., Disp: 6 tablet, Rfl: 0  levothyroxine  (SYNTHROID , LEVOTHROID) 112 MCG tablet, Take 112 mcg by mouth daily., Disp: , Rfl:    methocarbamol  (ROBAXIN ) 500 MG tablet, Take 1 tablet (500 mg total) by mouth daily as needed for muscle spasms., Disp: 30 tablet, Rfl: 0   metoprolol  succinate (TOPROL -XL) 25 MG 24 hr tablet, TAKE 1 TABLET BY MOUTH  DAILY (Patient taking differently: Take 25 mg by mouth daily.), Disp: 90 tablet, Rfl: 2   mycophenolate  (MYFORTIC ) 360 MG TBEC EC tablet, TAKE 1 TABLET BY MOUTH TWICE DAILY, Disp: 180 tablet, Rfl: 3   ondansetron  (ZOFRAN ) 4 MG tablet, Take 1 tablet (4 mg total) by mouth every 8 (eight) hours as needed for nausea or vomiting., Disp: 20 tablet, Rfl: 0   pantoprazole  (PROTONIX ) 20 MG tablet, Take 20 mg by mouth 2 (two) times daily., Disp: , Rfl:    predniSONE  (DELTASONE ) 5 MG tablet, Take 1 tablet (5  mg total) by mouth daily with breakfast., Disp: 90 tablet, Rfl: 1   sertraline (ZOLOFT) 25 MG tablet, Take 25 mg by mouth daily., Disp: , Rfl:    SPIRIVA  RESPIMAT 2.5 MCG/ACT AERS, INHALE 2 PUFFS INTO THE LUNGS DAILY, Disp: 4 g, Rfl: 11   sulfamethoxazole -trimethoprim  (BACTRIM  DS) 800-160 MG tablet, Take 1 tablet by mouth 3 (three) times a week. TAKE 1 TABLET BY MOUTH ON MONDAY, WEDNESDAY, FRIDAY, Disp: 90 tablet, Rfl: 3   Vitamin D , Cholecalciferol , 25 MCG (1000 UT) TABS, Take 1 tablet by mouth daily., Disp: , Rfl:       Objective:   Vitals:   07/06/23 1331  BP: 127/68  Pulse: 74  SpO2: 97%  Weight: 136 lb 1.6 oz (61.7 kg)  Height: 5' 3.5 (1.613 m)    Estimated body mass index is 23.73 kg/m as calculated from the following:   Height as of this encounter: 5' 3.5 (1.613 m).   Weight as of this encounter: 136 lb 1.6 oz (61.7 kg).  @WEIGHTCHANGE @  Filed Weights   07/06/23 1331  Weight: 136 lb 1.6 oz (61.7 kg)     Physical Exam   General: No distress. Looks well O2 at rest: no Cane present: no Sitting in wheel chair: no Frail: no Obese: no Neuro: Alert and Oriented x 3. GCS 15. Speech normal Psych: Pleasant Resp:  Barrel Chest - no.  Wheeze - no, Crackles - no, No overt respiratory distress CVS: Normal heart sounds. Murmurs - no Ext: Stigmata of Connective Tissue Disease - SCLERODRMA MILD HEENT: Normal upper airway. PEERL +. No post nasal drip        Assessment:       ICD-10-CM   1. ILD (interstitial lung disease) (HCC)  J84.9 CT Chest High Resolution    2. Scl-70 antibody positive  R76.8 CT Chest High Resolution    3. High risk medication use  Z79.899 CT Chest High Resolution    4. Scleroderma (HCC)  M34.9 CT Chest High Resolution    5. Centrilobular emphysema (HCC)  J43.2 CT Chest High Resolution    6. [name] phenomenon without gangrene  I73.00 CT Chest High Resolution    7. Therapeutic drug monitoring  Z51.81 CT Chest High Resolution    8.  Current chronic use of systemic steroids  Z79.52 CT Chest High Resolution         Plan:     Patient Instructions  Interstitial pulmonary disease (HCC) Scl-70 antibody positive [name] phenomenon without gangrene High risk medication use Therapeutic drug monitoring    - CLinically stable ILD x  2 years . CT dec 2023 shows mild ILD.  ; last CT -Now dealing with for wound healing in the left lower extremity shin following trauma   Plan -Continue prednisone  but let see if you can handle a taper especially with poor wound healing  -Cut down prednisone  to 4 mg/day for 2 weeks and then down to 3 mg a day for 2 weeks and then 2 mg a day for 2 weeks and then 1 mg a day for 2 weeks and then 1 mg on Monday Wednesday Friday for 2 weeks and stop  -If you are having steroid withdrawal then please let us  know -Continue Myfortic  as before - Continue Bactrim  as before -Do high-resolution CT chest in 3 months and return for follow-up   Pulmonary emphysema, unspecified emphysema type (HCC)  Plan -  Continue spiriva     Followup 14 weeks s with Dr. Geronimo ' 15-minute visit but after CT scan  -Evaluate stability of interstitial lung disease but also wound healing and prednisone  taper  - symptom score and walk test at followup  - Call or return sooner if needed   FOLLOWUP Return in about 14 weeks (around 10/12/2023) for 15 min visit, after HRCT chest, with Dr Geronimo, Face to Face Visit.    SIGNATURE    Dr. Dorethia Geronimo, M.D., F.[name].P,  Pulmonary and Critical Care Medicine Staff Physician, [facility] Director - Interstitial Lung Disease  Program  Pulmonary Fibrosis [facility] [place], [place], [address]  Pager: [phone], If no answer or between  15:00h - 7:00h: call [phone] Telephone: [phone]  2:00 PM 07/06/2023

## 2023-07-06 NOTE — Progress Notes (Signed)
 Spirometry/DLCO performed today.

## 2023-07-10 ENCOUNTER — Encounter (HOSPITAL_BASED_OUTPATIENT_CLINIC_OR_DEPARTMENT_OTHER): Payer: Medicare Other | Admitting: Internal Medicine

## 2023-07-10 DIAGNOSIS — L97822 Non-pressure chronic ulcer of other part of left lower leg with fat layer exposed: Secondary | ICD-10-CM | POA: Diagnosis not present

## 2023-07-10 DIAGNOSIS — Z7901 Long term (current) use of anticoagulants: Secondary | ICD-10-CM | POA: Diagnosis not present

## 2023-07-10 DIAGNOSIS — J849 Interstitial pulmonary disease, unspecified: Secondary | ICD-10-CM

## 2023-07-10 DIAGNOSIS — I87312 Chronic venous hypertension (idiopathic) with ulcer of left lower extremity: Secondary | ICD-10-CM

## 2023-07-11 ENCOUNTER — Encounter (HOSPITAL_BASED_OUTPATIENT_CLINIC_OR_DEPARTMENT_OTHER): Payer: Medicare Other | Admitting: Internal Medicine

## 2023-07-12 NOTE — Progress Notes (Signed)
 JACQUELINNE, SPEAK ([id number]) [id number] Page 1 of 9 Visit Report for 07/10/2023 Arrival Information Details Patient Name: Date of Service: [name], [name]. 07/10/2023 9:15 A M Medical Record Number: [id number] Patient Account Number: [id number] Date of Birth/Sex: Treating RN: 1941/05/29 (83 y.o. [name], [name] Primary Care Maryfer Tauzin: Cleotilde Olam [name] Other Clinician: Referring [name]: Treating [name]/Extender: Rosan Harlene Cleotilde Olam [name] Devra in Treatment: 0 Visit Information History Since Last Visit Added or deleted any medications: No Patient Arrived: Ambulatory Any new allergies or adverse reactions: No Arrival Time: 09:30 Had a fall or experienced change in No Accompanied By: daughter activities of daily living that may affect Transfer Assistance: None risk of falls: Patient Identification Verified: Yes Signs or symptoms of abuse/neglect since last visito No Secondary Verification Process Completed: Yes Hospitalized since last visit: No Patient Requires Transmission-Based Precautions: No Implantable device outside of the clinic excluding No Patient Has Alerts: Yes cellular tissue based products placed in the center Patient Alerts: Patient on Blood Thinner since last visit: Eliquis  Has Dressing in Place as Prescribed: Yes Has Compression in Place as Prescribed: Yes Pain Present Now: No Electronic Signature(s) Signed: 07/10/2023 4:48:15 PM By: [name] [name] RN, BSN Entered By: [name] [name] on 07/10/2023 09:41:05 -------------------------------------------------------------------------------- Clinic Level of Care Assessment Details Patient Name: Date of Service: [name], PA [name]. 07/10/2023 9:15 A M Medical Record Number: [id number] Patient Account Number: [id number] Date of Birth/Sex: Treating RN: 22-Jun-1941 (83 y.o. [name] [name] Primary Care Rekita Miotke: Cleotilde Olam [name] Other Clinician: Referring Devaunte Gasparini: Treating  Jamail Cullers/Extender: Rosan Harlene Cleotilde Olam [name] Devra in Treatment: 0 Clinic Level of Care Assessment Items TOOL 4 Quantity Score X- 1 0 Use when only an EandM is performed on FOLLOW-UP visit ASSESSMENTS - Nursing Assessment / Reassessment X- 1 10 Reassessment of Co-morbidities (includes updates in patient status) X- 1 5 Reassessment of Adherence to Treatment Plan ASSESSMENTS - Wound and Skin A ssessment / Reassessment X - Simple Wound Assessment / Reassessment - one wound 1 5 []  - 0 Complex Wound Assessment / Reassessment - multiple wounds []  - 0 Dermatologic / Skin Assessment (not related to wound area) ASSESSMENTS - Focused Assessment X- 1 5 Circumferential Edema Measurements - multi extremities []  - 0 Nutritional Assessment / Counseling / Intervention Tara, [name] ([id number]) [id number] Page 2 of 9 []  - 0 Lower Extremity Assessment (monofilament, tuning fork, pulses) []  - 0 Peripheral Arterial Disease Assessment (using hand held doppler) ASSESSMENTS - Ostomy and/or Continence Assessment and Care []  - 0 Incontinence Assessment and Management []  - 0 Ostomy Care Assessment and Management (repouching, etc.) PROCESS - Coordination of Care X - Simple Patient / Family Education for ongoing care 1 15 []  - 0 Complex (extensive) Patient / Family Education for ongoing care X- 1 10 Staff obtains Chiropractor, Records, T Results / Process Orders est []  - 0 Staff telephones HHA, Nursing Homes / Clarify orders / etc []  - 0 Routine Transfer to another Facility (non-emergent condition) []  - 0 Routine Hospital Admission (non-emergent condition) []  - 0 New Admissions / Manufacturing Engineer / Ordering NPWT Apligraf, etc. , []  - 0 Emergency Hospital Admission (emergent condition) X- 1 10 Simple Discharge Coordination []  - 0 Complex (extensive) Discharge Coordination PROCESS - Special Needs []  - 0 Pediatric / Minor Patient Management []  -  0 Isolation Patient Management []  - 0 Hearing / Language / Visual special needs []  - 0 Assessment of Community assistance (transportation, D/C planning, etc.) []  - 0 Additional assistance /  Altered mentation []  - 0 Support Surface(s) Assessment (bed, cushion, seat, etc.) INTERVENTIONS - Wound Cleansing / Measurement X - Simple Wound Cleansing - one wound 1 5 []  - 0 Complex Wound Cleansing - multiple wounds X- 1 5 Wound Imaging (photographs - any number of wounds) []  - 0 Wound Tracing (instead of photographs) X- 1 5 Simple Wound Measurement - one wound []  - 0 Complex Wound Measurement - multiple wounds INTERVENTIONS - Wound Dressings []  - 0 Small Wound Dressing one or multiple wounds []  - 0 Medium Wound Dressing one or multiple wounds X- 1 20 Large Wound Dressing one or multiple wounds X- 1 5 Application of Medications - topical []  - 0 Application of Medications - injection INTERVENTIONS - Miscellaneous []  - 0 External ear exam []  - 0 Specimen Collection (cultures, biopsies, blood, body fluids, etc.) []  - 0 Specimen(s) / Culture(s) sent or taken to Lab for analysis []  - 0 Patient Transfer (multiple staff / Nurse, Adult / Similar devices) []  - 0 Simple Staple / Suture removal (25 or less) []  - 0 Complex Staple / Suture removal (26 or more) []  - 0 Hypo / Hyperglycemic Management (close monitor of Blood Glucose) Tara, Kristian [name] ([id number]) [id number].pdf Page 3 of 9 []  - 0 Ankle / Brachial Index (ABI) - do not check if billed separately X- 1 5 Vital Signs Has the patient been seen at the hospital within the last three years: Yes Total Score: 105 Level Of Care: New/Established - Level 3 Electronic Signature(s) Signed: 07/10/2023 4:48:15 PM By: Drury Nestle RN, BSN Entered By: Drury Nestle on 07/10/2023 09:55:02 -------------------------------------------------------------------------------- Encounter Discharge Information  Details Patient Name: Date of Service: [name], PA [name]. 07/10/2023 9:15 A M Medical Record Number: [id number] Patient Account Number: [id number] Date of Birth/Sex: Treating RN: Jun 13, 1941 (83 y.o. Tara Drury Nestle Primary Care Janaiah Vetrano: Cleotilde Olam [name] Other Clinician: Referring Sharron Simpson: Treating Bay Wayson/Extender: Rosan Harlene Cleotilde Olam [name] Devra in Treatment: 0 Encounter Discharge Information Items Discharge Condition: Stable Ambulatory Status: Ambulatory Discharge Destination: Home Transportation: Private Auto Accompanied By: daughter Schedule Follow-up Appointment: Yes Clinical Summary of Care: Electronic Signature(s) Signed: 07/10/2023 4:48:15 PM By: Drury Nestle RN, BSN Entered By: Drury Nestle on 07/10/2023 09:55:25 -------------------------------------------------------------------------------- Lower Extremity Assessment Details Patient Name: Date of Service: [name], PA [name]. 07/10/2023 9:15 A M Medical Record Number: [id number] Patient Account Number: [id number] Date of Birth/Sex: Treating RN: 02-14-1941 (83 y.o. Tara Drury, Nestle Primary Care Tel Hevia: Cleotilde Olam [name] Other Clinician: Referring Brindy Higginbotham: Treating Andjela Wickes/Extender: Rosan Harlene Cleotilde Olam [name] Devra in Treatment: 0 Edema Assessment Assessed: Colletta: Yes] [Right: No] Edema: [Left: N] [Right: o] Calf Left: Right: Point of Measurement: 29 cm From Medial Instep 31 cm Ankle Left: Right: Point of Measurement: 8 cm From Medial Instep 19 cm Vascular Assessment Left: [[id number] Page 4 of 9Right:] Pulses: Dorsalis Pedis Palpable: [[id number] Page 4 of 9Yes] Extremity colors, hair growth, and conditions: Extremity Color: [phone].pdf Page 4 of 9Normal] Hair Growth on Extremity: [phone].pdf Page 4 of 9No] Temperature of Extremity: [phone].pdf Page 4 of  9Cool] Capillary Refill: [id number].pdf Page 4 of 9< 3 seconds] Dependent Rubor: [phone].pdf Page 4 of 9No] Blanched when Elevated: [phone].pdf Page 4 of 9No No] Toe Nail Assessment Left: Right: Thick: No Discolored: No Deformed: No Improper Length and Hygiene: No Electronic Signature(s) Signed: 07/10/2023 4:48:15 PM By: Drury Nestle RN, BSN Entered By: Drury Nestle on 07/10/2023 09:41:42 -------------------------------------------------------------------------------- Multi Wound Chart Details Patient Name: Date of Service: [name], PA  [name]. 07/10/2023 9:15 A M Medical Record Number: [id number] Patient Account Number: [id number] Date of Birth/Sex: Treating RN: 04-26-1941 (83 y.o. F) Primary Care Arlen Legendre: Cleotilde Olam [name] Other Clinician: Referring Spruha Weight: Treating Kayleana Waites/Extender: Rosan Harlene Cleotilde Olam [name] Devra in Treatment: 0 Vital Signs Height(in): 64 Pulse(bpm): 56 Weight(lbs): 133 Blood Pressure(mmHg): 128/76 Body Mass Index(BMI): 22.8 Temperature(F): 98.1 Respiratory Rate(breaths/min): 20 [2:Photos:] [N/A:N/A] Left, Anterior Lower Leg N/A N/A Wound Location: Hematoma N/A N/A Wounding Event: Skin Tear N/A N/A Primary Etiology: Cataracts, Chronic Obstructive N/A N/A Comorbid History: Pulmonary Disease (COPD), Arrhythmia, Raynauds, Scleroderma, Confinement Anxiety 06/25/2023 N/A N/A Date Acquired: 0 N/A N/A Weeks of Treatment: Open N/A N/A Wound Status: No N/A N/A Wound Recurrence: 2.5x5x0.1 N/A N/A Measurements L x W x D (cm) 9.817 N/A N/A A (cm) : rea 0.982 N/A N/A Volume (cm) : 55.40% N/A N/A % Reduction in A rea: 55.30% N/A N/A % Reduction in Volume: 12 Starting Position 1 (o'clock): ILAYDA, TODA ([id number]) [id number] Page 5 of 9 6 Ending Position 1 (o'clock): 0.8 Maximum Distance 1 (cm): Yes N/A  N/A Undermining: Full Thickness Without Exposed N/A N/A Classification: Support Structures Medium N/A N/A Exudate A mount: Serosanguineous N/A N/A Exudate Type: red, brown N/A N/A Exudate Color: Fibrotic scar, thickened scar N/A N/A Wound Margin: Large (67-100%) N/A N/A Granulation A mount: Red N/A N/A Granulation Quality: Small (1-33%) N/A N/A Necrotic A mount: Fat Layer (Subcutaneous Tissue): Yes N/A N/A Exposed Structures: Medium (34-66%) N/A N/A Epithelialization: Scarring: Yes N/A N/A Periwound Skin Texture: No Abnormalities Noted N/A N/A Periwound Skin Moisture: No Abnormalities Noted N/A N/A Periwound Skin Color: No Abnormality N/A N/A Temperature: noted skin flaps. N/A N/A Assessment Notes: Treatment Notes Wound #2 (Lower Leg) Wound Laterality: Left, Anterior Cleanser Soap and Water Discharge Instruction: (wash leg in clinic when leg wrappings are on) May shower and wash wound with dial antibacterial soap and water prior to dressing change. Wound Cleanser Discharge Instruction: Cleanse the wound with wound cleanser prior to applying a clean dressing using gauze sponges, not tissue or cotton balls. Peri-Wound Care Sween Lotion (Moisturizing lotion) Discharge Instruction: Apply moisturizing lotion as directed Topical Gentamicin Discharge Instruction: Apply to wound bed and under the skin flap in the middle of the wound Mupirocin Ointment Discharge Instruction: Apply to wound bed and under the skin flap in the middle of the wound Primary Dressing PolyMem Silver Non-Adhesive Dressing, 4.25x4.25 in Discharge Instruction: Apply to wound bed as instructed Secondary Dressing ABD Pad, 8x10 Discharge Instruction: Apply over primary dressing as directed. Secured With Compression Wrap Kerlix Roll 4.5x3.1 (in/yd) Discharge Instruction: Apply Kerlix and Coban compression as directed. Coban Self-Adherent Wrap 4x5 (in/yd) Discharge Instruction: Apply over Kerlix as  directed. Compression Stockings Add-Ons Electronic Signature(s) Signed: 07/10/2023 5:12:23 PM By: Rosan Harlene DO Entered By: Rosan Harlene on 07/10/2023 10:07:10 Multi-Disciplinary Care Plan Details -------------------------------------------------------------------------------- WELFORD AVELINA [name] ([id number]) [id number] Page 6 of 9 Patient Name: Date of Service: [name], PA [name]. 07/10/2023 9:15 A M Medical Record Number: [id number] Patient Account Number: [id number] Date of Birth/Sex: Treating RN: Dec 03, 1940 (83 y.o. Tara Drury Nestle Primary Care Markeda Narvaez: Cleotilde Olam [name] Other Clinician: Referring Darrin Apodaca: Treating Zenas Santa/Extender: Rosan Harlene Cleotilde Olam [name] Devra in Treatment: 0 Active Inactive Wound/Skin Impairment Nursing Diagnoses: Impaired tissue integrity Goals: Patient/caregiver will verbalize understanding of skin care regimen Date Initiated: 07/05/2023 Target Resolution Date: 09/25/2023 Goal Status: Active Interventions: Assess ulceration(s) every visit Treatment Activities: Skin care regimen initiated : 07/05/2023 Notes: Electronic Signature(s) Signed: 07/10/2023 4:48:15  PM By: Drury Nestle RN, BSN Entered By: Drury Nestle on 07/10/2023 09:51:02 -------------------------------------------------------------------------------- Pain Assessment Details Patient Name: Date of Service: [name], PA [name]. 07/10/2023 9:15 A M Medical Record Number: [id number] Patient Account Number: [id number] Date of Birth/Sex: Treating RN: Feb 12, 1941 (83 y.o. Tara Drury Nestle Primary Care Leiby Pigeon: Cleotilde Olam [name] Other Clinician: Referring Laria Grimmett: Treating Priscilla Kirstein/Extender: Rosan Harlene Cleotilde Olam [name] Devra in Treatment: 0 Active Problems Location of Pain Severity and Description of Pain Patient Has Paino No Site Locations Pain Management and Medication Current Pain Management: MECHILLE, VARGHESE ([id number])  [id number].pdf Page 7 of 9 Electronic Signature(s) Signed: 07/10/2023 4:48:15 PM By: Drury Nestle RN, BSN Entered By: Drury Nestle on 07/10/2023 09:41:20 -------------------------------------------------------------------------------- Patient/Caregiver Education Details Patient Name: Date of Service: [name], PA [name]. 1/13/2025andnbsp9:15 A M Medical Record Number: [id number] Patient Account Number: [id number] Date of Birth/Gender: Treating RN: 06/03/1941 (83 y.o. Tara Drury Nestle Primary Care Physician: Cleotilde Olam [name] Other Clinician: Referring Physician: Treating Physician/Extender: Rosan Harlene Cleotilde Olam [name] Devra in Treatment: 0 Education Assessment Education Provided To: Patient and Caregiver daughter Education Topics Provided Wound/Skin Impairment: Handouts: Caring for Your Ulcer Methods: Explain/Verbal Responses: Reinforcements needed Electronic Signature(s) Signed: 07/10/2023 4:48:15 PM By: Drury Nestle RN, BSN Entered By: Drury Nestle on 07/10/2023 09:51:24 -------------------------------------------------------------------------------- Wound Assessment Details Patient Name: Date of Service: [name], PA [name]. 07/10/2023 9:15 A M Medical Record Number: [id number] Patient Account Number: [id number] Date of Birth/Sex: Treating RN: 1941-04-05 (83 y.o. Tara Drury, Nestle Primary Care Addisson Frate: Cleotilde Olam [name] Other Clinician: Referring Kierstin January: Treating Mahreen Schewe/Extender: Rosan Harlene Cleotilde Olam [name] Devra in Treatment: 0 Wound Status Wound Number: 2 Primary Skin T ear Etiology: Wound Location: Left, Anterior Lower Leg Wound Open Wounding Event: Hematoma Status: Date Acquired: 06/25/2023 Comorbid Cataracts, Chronic Obstructive Pulmonary Disease (COPD), Weeks Of Treatment: 0 History: Arrhythmia, Raynauds, Scleroderma, Confinement Anxiety Clustered Wound: No Photos KENZLEY, KE ([id number])  [id number] Page 8 of 9 Wound Measurements Length: (cm) 2.5 Width: (cm) 5 Depth: (cm) 0.1 Area: (cm) 9.817 Volume: (cm) 0.982 % Reduction in Area: 55.4% % Reduction in Volume: 55.3% Epithelialization: Medium (34-66%) Tunneling: No Undermining: Yes Starting Position (o'clock): 12 Ending Position (o'clock): 6 Maximum Distance: (cm) 0.8 Wound Description Classification: Full Thickness Without Exposed Support Structures Wound Margin: Fibrotic scar, thickened scar Exudate Amount: Medium Exudate Type: Serosanguineous Exudate Color: red, brown Foul Odor After Cleansing: No Slough/Fibrino Yes Wound Bed Granulation Amount: Large (67-100%) Exposed Structure Granulation Quality: Red Fat Layer (Subcutaneous Tissue) Exposed: Yes Necrotic Amount: Small (1-33%) Necrotic Quality: Adherent Slough Periwound Skin Texture Texture Color No Abnormalities Noted: No No Abnormalities Noted: Yes Scarring: Yes Temperature / Pain Temperature: No Abnormality Moisture No Abnormalities Noted: Yes Assessment Notes noted skin flaps. Treatment Notes Wound #2 (Lower Leg) Wound Laterality: Left, Anterior Cleanser Soap and Water Discharge Instruction: (wash leg in clinic when leg wrappings are on) May shower and wash wound with dial antibacterial soap and water prior to dressing change. Wound Cleanser Discharge Instruction: Cleanse the wound with wound cleanser prior to applying a clean dressing using gauze sponges, not tissue or cotton balls. Peri-Wound Care Sween Lotion (Moisturizing lotion) Discharge Instruction: Apply moisturizing lotion as directed Topical Gentamicin Discharge Instruction: Apply to wound bed and under the skin flap in the middle of the wound Mupirocin Ointment Discharge Instruction: Apply to wound bed and under the skin flap in the middle of the wound Primary Dressing PolyMem Silver Non-Adhesive Dressing, 4.25x4.25 in Discharge Instruction: Apply to  wound bed as instructed Secondary Dressing MANIYA, DONOVAN ([id number]) [id number].pdf Page 9 of 9 ABD Pad, 8x10 Discharge Instruction: Apply over primary dressing as directed. Secured With Compression Wrap Kerlix Roll 4.5x3.1 (in/yd) Discharge Instruction: Apply Kerlix and Coban compression as directed. Coban Self-Adherent Wrap 4x5 (in/yd) Discharge Instruction: Apply over Kerlix as directed. Compression Stockings Add-Ons Electronic Signature(s) Signed: 07/10/2023 4:48:15 PM By: Drury Nestle RN, BSN Entered By: Drury Nestle on 07/10/2023 09:53:36 -------------------------------------------------------------------------------- Vitals Details Patient Name: Date of Service: [name], PA [name]. 07/10/2023 9:15 A M Medical Record Number: [id number] Patient Account Number: [id number] Date of Birth/Sex: Treating RN: [birth year] (83 y.o. Tara Drury, Nestle Primary Care Messiyah Waterson: Cleotilde Olam [name] Other Clinician: Referring Duston Smolenski: Treating Talissa Apple/Extender: Rosan Harlene Cleotilde Olam [name] Devra in Treatment: 0 Vital Signs Time Taken: 09:30 Temperature (F): 98.1 Height (in): 64 Pulse (bpm): 56 Weight (lbs): 133 Respiratory Rate (breaths/min): 20 Body Mass Index (BMI): 22.8 Blood Pressure (mmHg): 128/76 Reference Range: 80 - 120 mg / dl Electronic Signature(s) Signed: 07/10/2023 4:48:15 PM By: Drury Nestle RN, BSN Entered By: Drury Nestle on 07/10/2023 09:41:17

## 2023-07-12 NOTE — Progress Notes (Signed)
 [name], [name] ([id number]) [id number] Page 1 of 9 Visit Report for 07/10/2023 Chief Complaint Document Details Patient Name: Date of Service: [name], PA Tara J. 07/10/2023 9:15 A M Medical Record Number: [id number] Patient Account Number: [id number] Date of Birth/Sex: Treating RN: March 27, 1941 (83 y.o. F) Primary Care Provider: Cleotilde [name] CROME Other Clinician: Referring Provider: Treating Provider/Extender: [name] [name] CROME [name] in Treatment: 0 Information Obtained from: Patient Chief Complaint 07/05/2023; left lower extremity wound Electronic Signature(s) Signed: 07/10/2023 5:12:23 PM By: Tara Harlene DO Entered By: [name] on 07/10/2023 10:07:22 -------------------------------------------------------------------------------- HPI Details Patient Name: Date of Service: [name], PA Tara J. 07/10/2023 9:15 A M Medical Record Number: [id number] Patient Account Number: [id number] Date of Birth/Sex: Treating RN: April 13, 1941 (83 y.o. F) Primary Care Provider: Cleotilde [name] CROME Other Clinician: Referring Provider: Treating Provider/Extender: [name] [name] CROME [name] in Treatment: 0 History of Present Illness HPI Description: ADMISSION 03/03/2022 This is an 83 year old woman with a past medical history significant for scleroderma, interstitial lung disease, [name] syndrome, chronic steroid use, hereditary hemochromatosis cytosis and emphysema. She is chronically on corticosteroids as well as mycophenolate . She was visiting her brother in [place]  when she fell walking up the steps to the house. She suffered a laceration to her right anterior lower leg. She was seen at a local healthcare facility. The wound was dressed and she was discharged. She has returned to [place]  and was referred to the wound care center for further evaluation and management. She and her daughter have been caring for it at home. They have  been applying a nonadherent gauze over the wound and wrapping it with Kerlix and Coban. There is a U-shaped wound on the patient's anterior tibial surface. The wound edges are actually secured with Steri-Strips and there is no exposed tissue. There does appear to have been some serous drainage that has crusted around the edges of the wound but there is no sign or concern for infection. Just proximal to the main wound, she has a smaller abrasion that does get down into the fat layer. There is a little bit of slough and eschar present at this site. 03/09/2022: Much of the wound has healed. The Steri-Strips were removed to reveal just a small open portion at the most distal part of the wound. There is slough accumulation. No concern for infection. 03/16/2022: There is just a small open portion of the wound. It has a little slough accumulation. She was complaining that the areas where the wound is healing and there is eschar, she is noticing a pulling or tight sensation. 03/26/2019: The open portion at the most caudal aspect of her wound is red, swollen, and more tender today. There is a bit of slough accumulation, but no purulent drainage. There is a linear opening area at the most proximal and lateral part of the wound with some slough accumulation; the rest of the wound has healed. 04/04/2022: The swelling and tenderness that were present last visit have resolved. There is just a small residual opening at the most distal portion of her wound with some thin eschar overlying it. Everything else has healed. 04/11/2022: Her wound has healed. 07/05/2023 [name] is an 83 year old female with a past medical history of scleroderma, interstitial lung disease, [name] syndrome, chronic steroid use, hereditary hemochromatosis cytosis, A-fib on Eliquis  and emphysema. She is chronically on corticosteroids as well as mycophenolate . She presents today with a left lower extremity wound. She states that about  2  weeks ago she fell and scraped her leg. She has a skin tear to her lower leg she has been using IDONA, STACH ([id number]) [id number] Page 2 of 9 Xeroform. She is on chronic Bactrim  for her interstitial lung disease. She currently denies signs of infection. 1/13; patient presents for follow-up. Patient has had PolyMem with antibiotic ointment under the compression wrap for the past week. I had originally planned for Hydrofera Blue under the wrap. Patient tolerated this well however felt slightly claustrophobic with the wrap. I did offer daily dressing changes and for her to use her compression stocking however patient declined this and states she would like to continue with the wrap. She denies signs of infection. Electronic Signature(s) Signed: 07/10/2023 5:12:23 PM By: Tara Raisin DO Entered By: [name] on 07/10/2023 10:10:28 -------------------------------------------------------------------------------- Physical Exam Details Patient Name: Date of Service: [name], PA Tara J. 07/10/2023 9:15 A M Medical Record Number: [id number] Patient Account Number: [id number] Date of Birth/Sex: Treating RN: 1940/07/11 (83 y.o. F) Primary Care Provider: Cleotilde [name] CROME Other Clinician: Referring Provider: Treating Provider/Extender: [name] [name] CROME [name] in Treatment: 0 Constitutional respirations regular, non-labored and within target range for patient.. Cardiovascular 2+ dorsalis pedis/posterior tibialis pulses. Psychiatric pleasant and cooperative. Notes T the left lower extremity there is an open wound to the anterior shin with granulation tissue. Increased depth along the area where granulation tissue meets o intact flap. The flap appears to still be viable although I am questioning if this will still be the case in the near future. It may need to be removed. For now everything looks stable. No surrounding signs of infection.  Venous stasis dermatitis. Decent edema control. Electronic Signature(s) Signed: 07/10/2023 5:12:23 PM By: Tara Raisin DO Entered By: [name] on 07/10/2023 10:12:43 -------------------------------------------------------------------------------- Physician Orders Details Patient Name: Date of Service: [name], PA Tara J. 07/10/2023 9:15 A M Medical Record Number: [id number] Patient Account Number: [id number] Date of Birth/Sex: Treating RN: 02-Aug-1940 (83 y.o. [name], [name] Primary Care Provider: Cleotilde [name] CROME Other Clinician: Referring Provider: Treating Provider/Extender: [name] [name] CROME [name] in Treatment: 0 The following information was scribed by: [name] [name] The information was scribed for: [name] Verbal / Phone Orders: No Diagnosis Coding ICD-10 Coding Code Description 3614526031 Non-pressure chronic ulcer of other part of left lower leg with fat layer exposed I87.312 Chronic venous hypertension (idiopathic) with ulcer of left lower extremity J84.9 Interstitial pulmonary disease, unspecified Z79.01 Long term (current) use of anticoagulants Zalar, Aunesty J ([id number]) [id number] Page 3 of 9 I48.20 Chronic atrial fibrillation, unspecified Follow-up Appointments ppointment in 1 week. - Dr. Rosan (front office to schedule) Return A ppointment in 2 weeks. - Dr. Rosan (front office to schedule) Return A Anesthetic Wound #2 Left,Anterior Lower Leg (In clinic) Topical Lidocaine  4% applied to wound bed Bathing/ Shower/ Hygiene May shower with protection but do not get wound dressing(s) wet. Protect dressing(s) with water repellant cover (for example, large plastic bag) or a cast cover and may then take shower. - Please be careful when using the cast protector, it can be very slippery! Edema Control - Orders / Instructions Elevate legs to the level of the heart or above for 30 minutes daily and/or when  sitting for 3-4 times a day throughout the day. Avoid standing for long periods of time. Exercise regularly Additional Orders / Instructions Follow Nutritious Diet - Please try and have at least 60g-100g protein a day. Wound Treatment Wound #2 - Lower  Leg Wound Laterality: Left, Anterior Cleanser: Soap and Water 1 x Per Week/30 Days Discharge Instructions: (wash leg in clinic when leg wrappings are on) May shower and wash wound with dial antibacterial soap and water prior to dressing change. Cleanser: Wound Cleanser 1 x Per Week/30 Days Discharge Instructions: Cleanse the wound with wound cleanser prior to applying a clean dressing using gauze sponges, not tissue or cotton balls. Peri-Wound Care: Sween Lotion (Moisturizing lotion) 1 x Per Week/30 Days Discharge Instructions: Apply moisturizing lotion as directed Topical: Gentamicin 1 x Per Week/30 Days Discharge Instructions: Apply to wound bed and under the skin flap in the middle of the wound Topical: Mupirocin Ointment 1 x Per Week/30 Days Discharge Instructions: Apply to wound bed and under the skin flap in the middle of the wound Prim Dressing: PolyMem Silver Non-Adhesive Dressing, 4.25x4.25 in 1 x Per Week/30 Days ary Discharge Instructions: Apply to wound bed as instructed Secondary Dressing: ABD Pad, 8x10 1 x Per Week/30 Days Discharge Instructions: Apply over primary dressing as directed. Compression Wrap: Kerlix Roll 4.5x3.1 (in/yd) 1 x Per Week/30 Days Discharge Instructions: Apply Kerlix and Coban compression as directed. Compression Wrap: Coban Self-Adherent Wrap 4x5 (in/yd) 1 x Per Week/30 Days Discharge Instructions: Apply over Kerlix as directed. Electronic Signature(s) Signed: 07/10/2023 5:12:23 PM By: Tara Raisin DO Entered By: [name] on 07/10/2023 10:12:51 -------------------------------------------------------------------------------- Problem List Details Patient Name: Date of Service: [name], PA  Tara J. 07/10/2023 9:15 A M Medical Record Number: [id number] Patient Account Number: [id number] Date of Birth/Sex: Treating RN: 04/10/1941 (83 y.o. F) Primary Care Provider: Cleotilde [name] CROME Other Clinician: Referring Provider: Treating Provider/Extender: [name] [name] CROME [name] in Treatment: 0 Active Problems [name], [name] [name] ([id number]) [id number] Page 4 of 9 ICD-10 Encounter Code Description Active Date MDM Diagnosis 7690780954 Non-pressure chronic ulcer of other part of left lower leg with fat layer exposed1/01/2024 No Yes I87.312 Chronic venous hypertension (idiopathic) with ulcer of left lower extremity 07/05/2023 No Yes T79.8XXA Other early complications of trauma, initial encounter 07/10/2023 No Yes J84.9 Interstitial pulmonary disease, unspecified 07/05/2023 No Yes Z79.01 Long term (current) use of anticoagulants 07/05/2023 No Yes I48.20 Chronic atrial fibrillation, unspecified 07/05/2023 No Yes Inactive Problems Resolved Problems Electronic Signature(s) Signed: 07/10/2023 5:12:23 PM By: Tara Raisin DO Entered By: [name] on 07/10/2023 09:58:00 -------------------------------------------------------------------------------- Progress Note Details Patient Name: Date of Service: [name], PA Tara J. 07/10/2023 9:15 A M Medical Record Number: [id number] Patient Account Number: [id number] Date of Birth/Sex: Treating RN: 1941-04-13 (83 y.o. F) Primary Care Provider: Cleotilde [name] CROME Other Clinician: Referring Provider: Treating Provider/Extender: [name] [name] CROME [name] in Treatment: 0 Subjective Chief Complaint Information obtained from Patient 07/05/2023; left lower extremity wound History of Present Illness (HPI) ADMISSION 03/03/2022 This is an 83 year old woman with a past medical history significant for scleroderma, interstitial lung disease, [name] syndrome, chronic steroid use, hereditary hemochromatosis  cytosis and emphysema. She is chronically on corticosteroids as well as mycophenolate . She was visiting her brother in [place]  when she fell walking up the steps to the house. She suffered a laceration to her right anterior lower leg. She was seen at a local healthcare facility. The wound was dressed and she was discharged. She has returned to [place]  and was referred to the wound care center for further evaluation and management. She and her daughter have been caring for it at home. They have been applying a nonadherent gauze over the wound and wrapping it with Kerlix and  Coban. There is a U-shaped wound on the patient's anterior tibial surface. The wound edges are actually secured with Steri-Strips and there is no exposed tissue. There does appear to have been some serous drainage that has crusted around the edges of the wound but there is no sign or concern for infection. Just proximal to the main wound, she has a smaller abrasion that does get down into the fat layer. There is a little bit of slough and eschar present at this site. 03/09/2022: Much of the wound has healed. The Steri-Strips were removed to reveal just a small open portion at the most distal part of the wound. There is slough accumulation. No concern for infection. 03/16/2022: There is just a small open portion of the wound. It has a little slough accumulation. She was complaining that the areas where the wound is healing MARICIA, SCOTTI ([id number]) [id number] Page 5 of 9 and there is eschar, she is noticing a pulling or tight sensation. 03/26/2019: The open portion at the most caudal aspect of her wound is red, swollen, and more tender today. There is a bit of slough accumulation, but no purulent drainage. There is a linear opening area at the most proximal and lateral part of the wound with some slough accumulation; the rest of the wound has healed. 04/04/2022: The swelling and tenderness that  were present last visit have resolved. There is just a small residual opening at the most distal portion of her wound with some thin eschar overlying it. Everything else has healed. 04/11/2022: Her wound has healed. 07/05/2023 [name] is an 83 year old female with a past medical history of scleroderma, interstitial lung disease, [name] syndrome, chronic steroid use, hereditary hemochromatosis cytosis, A-fib on Eliquis  and emphysema. She is chronically on corticosteroids as well as mycophenolate . She presents today with a left lower extremity wound. She states that about 2 weeks ago she fell and scraped her leg. She has a skin tear to her lower leg she has been using Xeroform. She is on chronic Bactrim  for her interstitial lung disease. She currently denies signs of infection. 1/13; patient presents for follow-up. Patient has had PolyMem with antibiotic ointment under the compression wrap for the past week. I had originally planned for Hydrofera Blue under the wrap. Patient tolerated this well however felt slightly claustrophobic with the wrap. I did offer daily dressing changes and for her to use her compression stocking however patient declined this and states she would like to continue with the wrap. She denies signs of infection. Patient History Information obtained from Patient. Family History Diabetes - Siblings, Heart Disease - Father, Hypertension - Mother, Kidney Disease - Father, Stroke - Mother, No family history of Cancer, Hereditary Spherocytosis, Lung Disease, Seizures, Thyroid  Problems, Tuberculosis. Social History Former smoker - quit 25 yrs ago, Marital Status - Widowed, Alcohol  Use - Moderate, Drug Use - No History, Caffeine Use - Daily - coffee. Medical History Eyes Patient has history of Cataracts - bil removed Ear/Nose/Mouth/Throat Denies history of Chronic sinus problems/congestion, Middle ear problems Respiratory Patient has history of Chronic Obstructive  Pulmonary Disease (COPD) - mild Denies history of Aspiration, Asthma, Pneumothorax, Sleep Apnea, Tuberculosis Cardiovascular Patient has history of Arrhythmia - afib Endocrine Denies history of Type I Diabetes, Type II Diabetes Genitourinary Denies history of End Stage Renal Disease Immunological Patient has history of Raynauds, Scleroderma - lungs Denies history of Lupus Erythematosus Integumentary (Skin) Denies history of History of Burn Oncologic Denies history of Received Chemotherapy, Received Radiation Psychiatric  Patient has history of Confinement Anxiety Denies history of Anorexia/bulimia Hospitalization/Surgery History - mouth surgery. - left shouilder surgery. - total abdominal hysterectomy. - Kyphoplasty (L1 and L2). Medical A Surgical History Notes nd Ear/Nose/Mouth/Throat hearing loss, uses hearing aids Hematologic/Lymphatic hemochromatosis Respiratory pulmonary fibrosis, emphysema, Endocrine hypothyroidism Objective Constitutional respirations regular, non-labored and within target range for patient.. Vitals Time Taken: 9:30 AM, Height: 64 in, Weight: 133 lbs, BMI: 22.8, Temperature: 98.1 F, Pulse: 56 bpm, Respiratory Rate: 20 breaths/min, Blood Pressure: 128/76 mmHg. Cardiovascular 2+ dorsalis pedis/posterior tibialis pulses. [name], [name] ([id number]) [id number] Page 6 of 9 Psychiatric pleasant and cooperative. General Notes: T the left lower extremity there is an open wound to the anterior shin with granulation tissue. Increased depth along the area where granulation o tissue meets intact flap. The flap appears to still be viable although I am questioning if this will still be the case in the near future. It may need to be removed. For now everything looks stable. No surrounding signs of infection. Venous stasis dermatitis. Decent edema control. Integumentary (Hair, Skin) Wound #2 status is Open. Original cause of wound was  Hematoma. The date acquired was: 06/25/2023. The wound is located on the Left,Anterior Lower Leg. The wound measures 2.5cm length x 5cm width x 0.1cm depth; 9.817cm^2 area and 0.982cm^3 volume. There is Fat Layer (Subcutaneous Tissue) exposed. There is no tunneling noted, however, there is undermining starting at 12:00 and ending at 6:00 with a maximum distance of 0.8cm. There is a medium amount of serosanguineous drainage noted. The wound margin is fibrotic, thickened scar. There is large (67-100%) red granulation within the wound bed. There is a small (1-33%) amount of necrotic tissue within the wound bed including Adherent Slough. The periwound skin appearance had no abnormalities noted for moisture. The periwound skin appearance had no abnormalities noted for color. The periwound skin appearance exhibited: Scarring. Periwound temperature was noted as No Abnormality. General Notes: noted skin flaps. Assessment Active Problems ICD-10 Non-pressure chronic ulcer of other part of left lower leg with fat layer exposed Chronic venous hypertension (idiopathic) with ulcer of left lower extremity Other early complications of trauma, initial encounter Interstitial pulmonary disease, unspecified Long term (current) use of anticoagulants Chronic atrial fibrillation, unspecified Patient's wound is smaller. It appears well-healing. There is increased depth in the center of the wound where the original injury occurred. I recommended continuing the course with antibiotic ointment and PolyMem silver under compression therapy. She knows to call with any questions or concerns. Follow-up in 1 week. Plan Follow-up Appointments: Return Appointment in 1 week. - Dr. Rosan (front office to schedule) Return Appointment in 2 weeks. - Dr. Rosan (front office to schedule) Anesthetic: Wound #2 Left,Anterior Lower Leg: (In clinic) Topical Lidocaine  4% applied to wound bed Bathing/ Shower/ Hygiene: May shower  with protection but do not get wound dressing(s) wet. Protect dressing(s) with water repellant cover (for example, large plastic bag) or a cast cover and may then take shower. - Please be careful when using the cast protector, it can be very slippery! Edema Control - Orders / Instructions: Elevate legs to the level of the heart or above for 30 minutes daily and/or when sitting for 3-4 times a day throughout the day. Avoid standing for long periods of time. Exercise regularly Additional Orders / Instructions: Follow Nutritious Diet - Please try and have at least 60g-100g protein a day. WOUND #2: - Lower Leg Wound Laterality: Left, Anterior Cleanser: Soap and Water 1 x Per Week/30 Days Discharge  Instructions: (wash leg in clinic when leg wrappings are on) May shower and wash wound with dial antibacterial soap and water prior to dressing change. Cleanser: Wound Cleanser 1 x Per Week/30 Days Discharge Instructions: Cleanse the wound with wound cleanser prior to applying a clean dressing using gauze sponges, not tissue or cotton balls. Peri-Wound Care: Sween Lotion (Moisturizing lotion) 1 x Per Week/30 Days Discharge Instructions: Apply moisturizing lotion as directed Topical: Gentamicin 1 x Per Week/30 Days Discharge Instructions: Apply to wound bed and under the skin flap in the middle of the wound Topical: Mupirocin Ointment 1 x Per Week/30 Days Discharge Instructions: Apply to wound bed and under the skin flap in the middle of the wound Prim Dressing: PolyMem Silver Non-Adhesive Dressing, 4.25x4.25 in 1 x Per Week/30 Days ary Discharge Instructions: Apply to wound bed as instructed Secondary Dressing: ABD Pad, 8x10 1 x Per Week/30 Days Discharge Instructions: Apply over primary dressing as directed. Com pression Wrap: Kerlix Roll 4.5x3.1 (in/yd) 1 x Per Week/30 Days Discharge Instructions: Apply Kerlix and Coban compression as directed. Com pression Wrap: Coban Self-Adherent Wrap 4x5  (in/yd) 1 x Per Week/30 Days Discharge Instructions: Apply over Kerlix as directed. 1. Antibiotic ointment with PolyMem silver under Kerlix/Coban to the left lower extremity 2. Follow-up in 1 week Electronic Signature(s) Tara, [name] ([id number]) [id number] Page 7 of 9 Signed: 07/10/2023 5:12:23 PM By: Tara Raisin DO Entered By: [name] on 07/10/2023 10:14:13 -------------------------------------------------------------------------------- HxROS Details Patient Name: Date of Service: [name], PA Tara J. 07/10/2023 9:15 A M Medical Record Number: [id number] Patient Account Number: [id number] Date of Birth/Sex: Treating RN: 09/28/1940 (83 y.o. F) Primary Care Provider: Cleotilde [name] CROME Other Clinician: Referring Provider: Treating Provider/Extender: [name] [name] CROME [name] in Treatment: 0 Information Obtained From Patient Eyes Medical History: Positive for: Cataracts - bil removed Ear/Nose/Mouth/Throat Medical History: Negative for: Chronic sinus problems/congestion; Middle ear problems Past Medical History Notes: hearing loss, uses hearing aids Hematologic/Lymphatic Medical History: Past Medical History Notes: hemochromatosis Respiratory Medical History: Positive for: Chronic Obstructive Pulmonary Disease (COPD) - mild Negative for: Aspiration; Asthma; Pneumothorax; Sleep Apnea; Tuberculosis Past Medical History Notes: pulmonary fibrosis, emphysema, Cardiovascular Medical History: Positive for: Arrhythmia - afib Endocrine Medical History: Negative for: Type I Diabetes; Type II Diabetes Past Medical History Notes: hypothyroidism Genitourinary Medical History: Negative for: End Stage Renal Disease Immunological Medical History: Positive for: Raynauds; Scleroderma - lungs Negative for: Lupus Erythematosus Integumentary (Skin) Medical History: Negative for: History of Burn Oncologic Tara, [name]  ([id number]) [id number] Page 8 of 9 Medical History: Negative for: Received Chemotherapy; Received Radiation Psychiatric Medical History: Positive for: Confinement Anxiety Negative for: Anorexia/bulimia HBO Extended History Items Eyes: Cataracts Immunizations Pneumococcal Vaccine: Received Pneumococcal Vaccination: Yes Received Pneumococcal Vaccination On or After 60th Birthday: Yes Implantable Devices None Hospitalization / Surgery History Type of Hospitalization/Surgery mouth surgery left shouilder surgery total abdominal hysterectomy Kyphoplasty (L1 and L2) Family and Social History Cancer: No; Diabetes: Yes - Siblings; Heart Disease: Yes - Father; Hereditary Spherocytosis: No; Hypertension: Yes - Mother; Kidney Disease: Yes - Father; Lung Disease: No; Seizures: No; Stroke: Yes - Mother; Thyroid  Problems: No; Tuberculosis: No; Former smoker - quit 25 yrs ago; Marital Status - Widowed; Alcohol  Use: Moderate; Drug Use: No History; Caffeine Use: Daily - coffee Social Determinants of Health (SDOH) 1. In the past 2 months, did you or others you live with eat smaller meals or skip meals because you didn't have money for foodo : No 2. Are you  homeless or worried that you might be in the futureo : No 3. Do you have trouble paying for your utilities (gas, electricity, phone)o : No 4. Do you have trouble finding or paying for a rideo : No 5. Do you need daycare, or better daycare, for your kidso : No 6. Are you unemployed or without regular incomeo : No 7. Do you need help finding a better jobo : No 8. Do you need help getting more educationo : No 9. Are you concerned about someone in your home using drugs or alcoholo : No 10. Do you feel unsafe in your daily lifeo : No 11. Is anyone in your home threatening or abusing youo : No 12. Do you lack quality relationships that make you feel valued and supportedo : No 13. Do you need help getting cultural  information in a language you understando : No 14. Do you need help getting internet accesso : No Advanced Directives and Instructions Spiritual or Cultural beliefs preclude asking about Advance Care Planning: No Advanced Directives: Yes; On file at [facility] Copy Provided: No Do not resuscitate: No Living Will: No Medical Power of Attorney: No Surrogate Decision Maker: No Electronic Signature(s) Signed: 07/10/2023 5:12:23 PM By: Tara Raisin DO Entered By: [name] on 07/10/2023 10:10:34 -------------------------------------------------------------------------------- SuperBill Details Patient Name: Date of Service: [name], PA Tara J. 07/10/2023 Medical Record Number: [id number] Patient Account Number: [id number] Date of Birth/Sex: Treating RN: [birth year] (83 y.o. [name] Primary Care Provider: Cleotilde [name] CROME Other Clinician: NATIVIDAD, SCHLOSSER ([id number]) [id number] Page 9 of 9 Referring Provider: Treating Provider/Extender: [name] [name] CROME [name] in Treatment: 0 Diagnosis Coding ICD-10 Codes Code Description [phone] Non-pressure chronic ulcer of other part of left lower leg with fat layer exposed I87.312 Chronic venous hypertension (idiopathic) with ulcer of left lower extremity J84.9 Interstitial pulmonary disease, unspecified Z79.01 Long term (current) use of anticoagulants I48.20 Chronic atrial fibrillation, unspecified Facility Procedures : CPT4 Code: [id number] Description: 99213 - WOUND CARE VISIT-LEV 3 EST PT Modifier: Quantity: 1 Physician Procedures : CPT4 Code Description Modifier [id number] - WC PHYS LEVEL 3 - EST PT ICD-10 Diagnosis Description L97.822 Non-pressure chronic ulcer of other part of left lower leg with fat layer exposed I87.312 Chronic venous hypertension (idiopathic) with ulcer  of left lower extremity J84.9 Interstitial pulmonary disease, unspecified Z79.01 Long term (current)  use of anticoagulants Quantity: 1 Electronic Signature(s) Signed: 07/10/2023 5:12:23 PM By: Tara Raisin DO Entered By: [name] on 07/10/2023 10:14:40

## 2023-07-17 ENCOUNTER — Encounter (HOSPITAL_BASED_OUTPATIENT_CLINIC_OR_DEPARTMENT_OTHER): Payer: Medicare Other | Admitting: Internal Medicine

## 2023-07-17 DIAGNOSIS — I87312 Chronic venous hypertension (idiopathic) with ulcer of left lower extremity: Secondary | ICD-10-CM | POA: Diagnosis not present

## 2023-07-17 DIAGNOSIS — T798XXA Other early complications of trauma, initial encounter: Secondary | ICD-10-CM

## 2023-07-17 DIAGNOSIS — L97822 Non-pressure chronic ulcer of other part of left lower leg with fat layer exposed: Secondary | ICD-10-CM

## 2023-07-17 NOTE — Progress Notes (Signed)
[name] ([id number]) [id number] Page 1 of 10 Visit Report for 07/17/2023 Chief Complaint Document Details Patient Name: Date of Service: [name], [name]. 07/17/2023 9:30 A M Medical Record Number: [id number] Patient Account Number: [id number] Date of Birth/Sex: Treating RN: 1941-04-12 (83 y.o. F) Primary Care Provider: Montey Hora Other Clinician: Referring Provider: Treating Provider/Extender: Doreen Salvage in Treatment: 1 Information Obtained from: Patient Chief Complaint 07/05/2023; left lower extremity wound Electronic Signature(s) Signed: 07/17/2023 12:33:32 PM By: Geralyn Corwin DO Entered By: Geralyn Corwin on 07/17/2023 11:26:21 -------------------------------------------------------------------------------- Debridement Details Patient Name: Date of Service: [name], Tara [name]. 07/17/2023 9:30 A M Medical Record Number: [id number] Patient Account Number: [id number] Date of Birth/Sex: Treating RN: 1940-11-19 (83 y.o. [name] Primary Care Provider: Montey Hora Other Clinician: Referring Provider: Treating Provider/Extender: Doreen Salvage in Treatment: 1 Debridement Performed for Assessment: Wound #2 Left,Anterior Lower Leg Performed By: Physician Geralyn Corwin, DO The following information was scribed by: Midge Aver The information was scribed for: Geralyn Corwin Debridement Type: Debridement Level of Consciousness (Pre-procedure): Awake and Alert Pre-procedure Verification/Time Out Yes - 09:52 Taken: Start Time: 09:52 Pain Control: Lidocaine 4% T opical Solution Percent of Wound Bed Debrided: 100% T Area Debrided (cm): otal 9.45 Tissue and other material debrided: Viable, Slough, Subcutaneous, Skin: Dermis , Skin: Epidermis, Slough Level: Skin/Subcutaneous Tissue Debridement Description: Excisional Instrument: Curette Bleeding: Minimum Hemostasis  Achieved: Pressure Procedural Pain: 0 Post Procedural Pain: 0 Response to Treatment: Procedure was tolerated well Level of Consciousness (Post- Awake and Alert procedure): Post Debridement Measurements of Total Wound Length: (cm) 2.8 Width: (cm) 4.3 Depth: (cm) 0.1 Volume: (cm) 0.946 Character of Wound/Ulcer Post Debridement: Stable [name], [name] ([id number]) [id number] Page 2 of 10 Post Procedure Diagnosis Same as Pre-procedure Electronic Signature(s) Signed: 07/17/2023 12:33:32 PM By: Geralyn Corwin DO Signed: 07/17/2023 4:09:14 PM By: Midge Aver MSN RN CNS WTA Entered By: Midge Aver on 07/17/2023 09:54:36 -------------------------------------------------------------------------------- HPI Details Patient Name: Date of Service: [name], Tara [name]. 07/17/2023 9:30 A M Medical Record Number: [id number] Patient Account Number: [id number] Date of Birth/Sex: Treating RN: [birth year] (83 y.o. F) Primary Care Provider: Montey Hora Other Clinician: Referring Provider: Treating Provider/Extender: Doreen Salvage in Treatment: 1 History of Present Illness HPI Description: ADMISSION 03/03/2022 This is an 83 year old woman with a past medical history significant for scleroderma, interstitial lung disease, [name] syndrome, chronic steroid use, hereditary hemochromatosis cytosis and emphysema. She is chronically on corticosteroids as well as mycophenolate. She was visiting her brother in [place] when she fell walking up the steps to the house. She suffered a laceration to her right anterior lower leg. She was seen at a local healthcare facility. The wound was dressed and she was discharged. She has returned to [place] and was referred to the wound care center for further evaluation and management. She and her daughter have been caring for it at home. They have been applying a nonadherent gauze over the wound and wrapping  it with Kerlix and Coban. There is a U-shaped wound on the patient's anterior tibial surface. The wound edges are actually secured with Steri-Strips and there is no exposed tissue. There does appear to have been some serous drainage that has crusted around the edges of the wound but there is no sign or concern for infection. Just proximal to the main wound, she has a smaller abrasion that does get down into  the fat layer. There is a little bit of slough and eschar present at this site. 03/09/2022: Much of the wound has healed. The Steri-Strips were removed to reveal just a small open portion at the most distal part of the wound. There is slough accumulation. No concern for infection. 03/16/2022: There is just a small open portion of the wound. It has a little slough accumulation. She was complaining that the areas where the wound is healing and there is eschar, she is noticing a pulling or tight sensation. 03/26/2019: The open portion at the most caudal aspect of her wound is red, swollen, and more tender today. There is a bit of slough accumulation, but no purulent drainage. There is a linear opening area at the most proximal and lateral part of the wound with some slough accumulation; the rest of the wound has healed. 04/04/2022: The swelling and tenderness that were present last visit have resolved. There is just a small residual opening at the most distal portion of her wound with some thin eschar overlying it. Everything else has healed. 04/11/2022: Her wound has healed. 07/05/2023 [name] is an 83 year old female with a past medical history of scleroderma, interstitial lung disease, [name] syndrome, chronic steroid use, hereditary hemochromatosis cytosis, A-fib on Eliquis and emphysema. She is chronically on corticosteroids as well as mycophenolate. She presents today with a left lower extremity wound. She states that about 2 weeks ago she fell and scraped her leg. She has a skin tear  to her lower leg she has been using Xeroform. She is on chronic Bactrim for her interstitial lung disease. She currently denies signs of infection. 1/13; patient presents for follow-up. Patient has had PolyMem with antibiotic ointment under the compression wrap for the past week. I had originally planned for [facility] under the wrap. Patient tolerated this well however felt slightly claustrophobic with the wrap. I did offer daily dressing changes and for her to use her compression stocking however patient declined this and states she would like to continue with the wrap. She denies signs of infection. 07/17/2023; patient presents for follow-up. We have been using PolyMem silver with antibiotic ointment under compression therapy. Patient has no issues or complaints. Wound is smaller. Electronic Signature(s) Signed: 07/17/2023 12:33:32 PM By: Geralyn Corwin DO Entered By: Geralyn Corwin on 07/17/2023 11:26:50 Salmela, Earnstine Regal ([id number]) [id number] Page 3 of 10 -------------------------------------------------------------------------------- Physical Exam Details Patient Name: Date of Service: [name], Tara [name]. 07/17/2023 9:30 A M Medical Record Number: [id number] Patient Account Number: [id number] Date of Birth/Sex: Treating RN: 1941-02-05 (83 y.o. F) Primary Care Provider: Montey Hora Other Clinician: Referring Provider: Treating Provider/Extender: Doreen Salvage in Treatment: 1 Constitutional respirations regular, non-labored and within target range for patient.. Cardiovascular 2+ dorsalis pedis/posterior tibialis pulses. Psychiatric pleasant and cooperative. Notes T the left lower extremity there is an open wound to the anterior shin with granulation tissue, nonviable tissue and devitalized tissue. Increased depth along the o area where granulation tissue meets intact flap. The flap appears to still be viable. No  surrounding signs of infection. Venous stasis dermatitis. Decent edema control. Electronic Signature(s) Signed: 07/17/2023 12:33:32 PM By: Geralyn Corwin DO Entered By: Geralyn Corwin on 07/17/2023 11:28:35 -------------------------------------------------------------------------------- Physician Orders Details Patient Name: Date of Service: [name], Tara [name]. 07/17/2023 9:30 A M Medical Record Number: [id number] Patient Account Number: [id number] Date of Birth/Sex: Treating RN: 1940-09-15 (83 y.o. [name] Primary Care Provider: Montey Hora  Other Clinician: Referring Provider: Treating Provider/Extender: Doreen Salvage in Treatment: 1 The following information was scribed by: Midge Aver The information was scribed for: Geralyn Corwin Verbal / Phone Orders: No Diagnosis Coding Follow-up Appointments ppointment in 1 week. - Dr. Mikey Bussing (front office to schedule) Return A ppointment in 2 weeks. - Dr. Mikey Bussing (front office to schedule) Return A Anesthetic Wound #2 Left,Anterior Lower Leg (In clinic) Topical Lidocaine 4% applied to wound bed Bathing/ Shower/ Hygiene May shower with protection but do not get wound dressing(s) wet. Protect dressing(s) with water repellant cover (for example, large plastic bag) or a cast cover and may then take shower. - Please be careful when using the cast protector, it can be very slippery! Edema Control - Orders / Instructions Elevate legs to the level of the heart or above for 30 minutes daily and/or when sitting for 3-4 times a day throughout the day. Avoid standing for long periods of time. Exercise regularly Additional Orders / Instructions Follow Nutritious Diet - Please try and have at least 60g-100g protein a day. Wound Treatment Wound #2 - Lower Leg Wound Laterality: Left, Anterior Cleanser: Soap and Water 1 x Per Week/30 Days Discharge Instructions: (wash leg in clinic when leg wrappings are on)  May shower and wash wound with dial antibacterial soap and water prior to dressing change. MALIKAH, REGULA ([id number]) [id number] Page 4 of 10 Cleanser: Wound Cleanser 1 x Per Week/30 Days Discharge Instructions: Cleanse the wound with wound cleanser prior to applying a clean dressing using gauze sponges, not tissue or cotton balls. Peri-Wound Care: Sween Lotion (Moisturizing lotion) 1 x Per Week/30 Days Discharge Instructions: Apply moisturizing lotion as directed Topical: Gentamicin 1 x Per Week/30 Days Discharge Instructions: Apply to wound bed and under the skin flap in the middle of the wound Topical: Mupirocin Ointment 1 x Per Week/30 Days Discharge Instructions: Apply to wound bed and under the skin flap in the middle of the wound Prim Dressing: PolyMem Silver Non-Adhesive Dressing, 4.25x4.25 in 1 x Per Week/30 Days ary Discharge Instructions: Apply to wound bed as instructed Secondary Dressing: ABD Pad, 8x10 1 x Per Week/30 Days Discharge Instructions: Apply over primary dressing as directed. Compression Wrap: Kerlix Roll 4.5x3.1 (in/yd) 1 x Per Week/30 Days Discharge Instructions: Apply Kerlix and Coban compression as directed. Compression Wrap: Coban Self-Adherent Wrap 4x5 (in/yd) 1 x Per Week/30 Days Discharge Instructions: Apply over Kerlix as directed. Electronic Signature(s) Signed: 07/17/2023 12:33:32 PM By: Geralyn Corwin DO Entered By: Geralyn Corwin on 07/17/2023 11:28:43 -------------------------------------------------------------------------------- Problem List Details Patient Name: Date of Service: [name], Tara [name]. 07/17/2023 9:30 A M Medical Record Number: [id number] Patient Account Number: [id number] Date of Birth/Sex: Treating RN: 17-Oct-1940 (83 y.o. F) Primary Care Provider: Montey Hora Other Clinician: Referring Provider: Treating Provider/Extender: Doreen Salvage in Treatment: 1 Active  Problems ICD-10 Encounter Code Description Active Date MDM Diagnosis [phone] Non-pressure chronic ulcer of other part of left lower leg with fat layer exposed1/01/2024 No Yes I87.312 Chronic venous hypertension (idiopathic) with ulcer of left lower extremity 07/05/2023 No Yes T79.8XXA Other early complications of trauma, initial encounter 07/10/2023 No Yes J84.9 Interstitial pulmonary disease, unspecified 07/05/2023 No Yes Z79.01 Long term (current) use of anticoagulants 07/05/2023 No Yes I48.20 Chronic atrial fibrillation, unspecified 07/05/2023 No Yes KALONI, BADOLATO ([id number]) [phone].pdf Page 5 of 10 Inactive Problems Resolved Problems Electronic Signature(s) Signed: 07/17/2023 12:33:32 PM By: Geralyn Corwin DO Entered By: Geralyn Corwin on 07/17/2023  11:26:02 -------------------------------------------------------------------------------- Progress Note Details Patient Name: Date of Service: [name], Tara [name]. 07/17/2023 9:30 A M Medical Record Number: [id number] Patient Account Number: [id number] Date of Birth/Sex: Treating RN: 1940/07/12 (83 y.o. F) Primary Care Provider: Montey Hora Other Clinician: Referring Provider: Treating Provider/Extender: Doreen Salvage in Treatment: 1 Subjective Chief Complaint Information obtained from Patient 07/05/2023; left lower extremity wound History of Present Illness (HPI) ADMISSION 03/03/2022 This is an 83 year old woman with a past medical history significant for scleroderma, interstitial lung disease, [name] syndrome, chronic steroid use, hereditary hemochromatosis cytosis and emphysema. She is chronically on corticosteroids as well as mycophenolate. She was visiting her brother in [place] when she fell walking up the steps to the house. She suffered a laceration to her right anterior lower leg. She was seen at a local healthcare facility. The wound was dressed and she was  discharged. She has returned to [place] and was referred to the wound care center for further evaluation and management. She and her daughter have been caring for it at home. They have been applying a nonadherent gauze over the wound and wrapping it with Kerlix and Coban. There is a U-shaped wound on the patient's anterior tibial surface. The wound edges are actually secured with Steri-Strips and there is no exposed tissue. There does appear to have been some serous drainage that has crusted around the edges of the wound but there is no sign or concern for infection. Just proximal to the main wound, she has a smaller abrasion that does get down into the fat layer. There is a little bit of slough and eschar present at this site. 03/09/2022: Much of the wound has healed. The Steri-Strips were removed to reveal just a small open portion at the most distal part of the wound. There is slough accumulation. No concern for infection. 03/16/2022: There is just a small open portion of the wound. It has a little slough accumulation. She was complaining that the areas where the wound is healing and there is eschar, she is noticing a pulling or tight sensation. 03/26/2019: The open portion at the most caudal aspect of her wound is red, swollen, and more tender today. There is a bit of slough accumulation, but no purulent drainage. There is a linear opening area at the most proximal and lateral part of the wound with some slough accumulation; the rest of the wound has healed. 04/04/2022: The swelling and tenderness that were present last visit have resolved. There is just a small residual opening at the most distal portion of her wound with some thin eschar overlying it. Everything else has healed. 04/11/2022: Her wound has healed. 07/05/2023 Ms. Nao Callicoat is an 83 year old female with a past medical history of scleroderma, interstitial lung disease, [name] syndrome, chronic steroid use, hereditary  hemochromatosis cytosis, A-fib on Eliquis and emphysema. She is chronically on corticosteroids as well as mycophenolate. She presents today with a left lower extremity wound. She states that about 2 weeks ago she fell and scraped her leg. She has a skin tear to her lower leg she has been using Xeroform. She is on chronic Bactrim for her interstitial lung disease. She currently denies signs of infection. 1/13; patient presents for follow-up. Patient has had PolyMem with antibiotic ointment under the compression wrap for the past week. I had originally planned for [facility] under the wrap. Patient tolerated this well however felt slightly claustrophobic with the wrap. I did offer daily dressing changes  and for her to use her compression stocking however patient declined this and states she would like to continue with the wrap. She denies signs of infection. 07/17/2023; patient presents for follow-up. We have been using PolyMem silver with antibiotic ointment under compression therapy. Patient has no issues or complaints. Wound is smaller. Patient History Information obtained from Patient. Family History Diabetes - Siblings, Heart Disease - Father, Hypertension - Mother, Kidney Disease - Father, Stroke - Mother, No family history of Cancer, Hereditary Spherocytosis, Lung Disease, Seizures, Thyroid Problems, Tuberculosis. Social History Former smoker - quit 25 yrs ago, Marital Status - Widowed, Alcohol Use - Moderate, Drug Use - No History, Caffeine Use - Daily - coffee. [name], [name] ([id number]) [id number] Page 6 of 10 Medical History Eyes Patient has history of Cataracts - bil removed Ear/Nose/Mouth/Throat Denies history of Chronic sinus problems/congestion, Middle ear problems Respiratory Patient has history of Chronic Obstructive Pulmonary Disease (COPD) - mild Denies history of Aspiration, Asthma, Pneumothorax, Sleep Apnea,  Tuberculosis Cardiovascular Patient has history of Arrhythmia - afib Endocrine Denies history of Type I Diabetes, Type II Diabetes Genitourinary Denies history of End Stage Renal Disease Immunological Patient has history of Raynauds, Scleroderma - lungs Denies history of Lupus Erythematosus Integumentary (Skin) Denies history of History of Burn Oncologic Denies history of Received Chemotherapy, Received Radiation Psychiatric Patient has history of Confinement Anxiety Denies history of Anorexia/bulimia Hospitalization/Surgery History - mouth surgery. - left shouilder surgery. - total abdominal hysterectomy. - Kyphoplasty (L1 and L2). Medical A Surgical History Notes nd Ear/Nose/Mouth/Throat hearing loss, uses hearing aids Hematologic/Lymphatic hemochromatosis Respiratory pulmonary fibrosis, emphysema, Endocrine hypothyroidism Objective Constitutional respirations regular, non-labored and within target range for patient.. Vitals Time Taken: 9:36 AM, Height: 64 in, Weight: 133 lbs, BMI: 22.8, Temperature: 97.5 F, Pulse: 54 bpm, Respiratory Rate: 18 breaths/min, Blood Pressure: 124/64 mmHg. Cardiovascular 2+ dorsalis pedis/posterior tibialis pulses. Psychiatric pleasant and cooperative. General Notes: T the left lower extremity there is an open wound to the anterior shin with granulation tissue, nonviable tissue and devitalized tissue. Increased o depth along the area where granulation tissue meets intact flap. The flap appears to still be viable. No surrounding signs of infection. Venous stasis dermatitis. Decent edema control. Integumentary (Hair, Skin) Wound #2 status is Open. Original cause of wound was Hematoma. The date acquired was: 06/25/2023. The wound has been in treatment 1 weeks. The wound is located on the Left,Anterior Lower Leg. The wound measures 2.8cm length x 4.3cm width x 0.1cm depth; 9.456cm^2 area and 0.946cm^3 volume. There is Fat Layer (Subcutaneous  Tissue) exposed. There is no tunneling or undermining noted. There is a medium amount of serosanguineous drainage noted. The wound margin is fibrotic, thickened scar. There is large (67-100%) red granulation within the wound bed. There is a small (1-33%) amount of necrotic tissue within the wound bed including Adherent Slough. The periwound skin appearance had no abnormalities noted for moisture. The periwound skin appearance had no abnormalities noted for color. The periwound skin appearance exhibited: Scarring. Periwound temperature was noted as No Abnormality. Assessment Active Problems ICD-10 Non-pressure chronic ulcer of other part of left lower leg with fat layer exposed Chronic venous hypertension (idiopathic) with ulcer of left lower extremity Other early complications of trauma, initial encounter Interstitial pulmonary disease, unspecified Long term (current) use of anticoagulants Chronic atrial fibrillation, unspecified [name], [name] ([id number]) [id number] Page 7 of 10 Patient's wound has shown improvement in size and appearance since last clinic visit. No signs of infection. I debrided nonviable tissue. I  recommended continuing the course with silver alginate and antibiotic ointment under Kerlix/Coban to the left lower extremity. Follow-up in 1 week. Procedures Wound #2 Pre-procedure diagnosis of Wound #2 is a Skin T located on the Left,Anterior Lower Leg . There was a Excisional Skin/Subcutaneous Tissue Debridement ear with a total area of 9.45 sq cm performed by Geralyn Corwin, DO. With the following instrument(s): Curette to remove Viable tissue/material. Material removed includes Subcutaneous Tissue, Slough, Skin: Dermis, and Skin: Epidermis after achieving pain control using Lidocaine 4% T opical Solution. No specimens were taken. A time out was conducted at 09:52, prior to the start of the procedure. A Minimum amount of bleeding was  controlled with Pressure. The procedure was tolerated well with a pain level of 0 throughout and a pain level of 0 following the procedure. Post Debridement Measurements: 2.8cm length x 4.3cm width x 0.1cm depth; 0.946cm^3 volume. Character of Wound/Ulcer Post Debridement is stable. Post procedure Diagnosis Wound #2: Same as Pre-Procedure Pre-procedure diagnosis of Wound #2 is a Skin T located on the Left,Anterior Lower Leg . There was a Double Layer Compression Therapy Procedure by ear Midge Aver, RN. Post procedure Diagnosis Wound #2: Same as Pre-Procedure Plan Follow-up Appointments: Return Appointment in 1 week. - Dr. Mikey Bussing (front office to schedule) Return Appointment in 2 weeks. - Dr. Mikey Bussing (front office to schedule) Anesthetic: Wound #2 Left,Anterior Lower Leg: (In clinic) Topical Lidocaine 4% applied to wound bed Bathing/ Shower/ Hygiene: May shower with protection but do not get wound dressing(s) wet. Protect dressing(s) with water repellant cover (for example, large plastic bag) or a cast cover and may then take shower. - Please be careful when using the cast protector, it can be very slippery! Edema Control - Orders / Instructions: Elevate legs to the level of the heart or above for 30 minutes daily and/or when sitting for 3-4 times a day throughout the day. Avoid standing for long periods of time. Exercise regularly Additional Orders / Instructions: Follow Nutritious Diet - Please try and have at least 60g-100g protein a day. WOUND #2: - Lower Leg Wound Laterality: Left, Anterior Cleanser: Soap and Water 1 x Per Week/30 Days Discharge Instructions: (wash leg in clinic when leg wrappings are on) May shower and wash wound with dial antibacterial soap and water prior to dressing change. Cleanser: Wound Cleanser 1 x Per Week/30 Days Discharge Instructions: Cleanse the wound with wound cleanser prior to applying a clean dressing using gauze sponges, not tissue or cotton  balls. Peri-Wound Care: Sween Lotion (Moisturizing lotion) 1 x Per Week/30 Days Discharge Instructions: Apply moisturizing lotion as directed Topical: Gentamicin 1 x Per Week/30 Days Discharge Instructions: Apply to wound bed and under the skin flap in the middle of the wound Topical: Mupirocin Ointment 1 x Per Week/30 Days Discharge Instructions: Apply to wound bed and under the skin flap in the middle of the wound Prim Dressing: PolyMem Silver Non-Adhesive Dressing, 4.25x4.25 in 1 x Per Week/30 Days ary Discharge Instructions: Apply to wound bed as instructed Secondary Dressing: ABD Pad, 8x10 1 x Per Week/30 Days Discharge Instructions: Apply over primary dressing as directed. Com pression Wrap: Kerlix Roll 4.5x3.1 (in/yd) 1 x Per Week/30 Days Discharge Instructions: Apply Kerlix and Coban compression as directed. Com pression Wrap: Coban Self-Adherent Wrap 4x5 (in/yd) 1 x Per Week/30 Days Discharge Instructions: Apply over Kerlix as directed. 1. In office sharp debridement 2. PolyMem silver with antibiotic ointment under Kerlix/Coban to the left lower extremity 3. Follow-up in 1 week Electronic  Signature(s) Signed: 07/17/2023 12:33:32 PM By: Geralyn Corwin DO Entered By: Geralyn Corwin on 07/17/2023 11:38:15 [name], [name] ([id number]) [id number] Page 8 of 10 -------------------------------------------------------------------------------- HxROS Details Patient Name: Date of Service: [name], Tara [name]. 07/17/2023 9:30 A M Medical Record Number: [id number] Patient Account Number: [id number] Date of Birth/Sex: Treating RN: [birth year] (83 y.o. F) Primary Care Provider: Montey Hora Other Clinician: Referring Provider: Treating Provider/Extender: Doreen Salvage in Treatment: 1 Information Obtained From Patient Eyes Medical History: Positive for: Cataracts - bil removed Ear/Nose/Mouth/Throat Medical History: Negative  for: Chronic sinus problems/congestion; Middle ear problems Past Medical History Notes: hearing loss, uses hearing aids Hematologic/Lymphatic Medical History: Past Medical History Notes: hemochromatosis Respiratory Medical History: Positive for: Chronic Obstructive Pulmonary Disease (COPD) - mild Negative for: Aspiration; Asthma; Pneumothorax; Sleep Apnea; Tuberculosis Past Medical History Notes: pulmonary fibrosis, emphysema, Cardiovascular Medical History: Positive for: Arrhythmia - afib Endocrine Medical History: Negative for: Type I Diabetes; Type II Diabetes Past Medical History Notes: hypothyroidism Genitourinary Medical History: Negative for: End Stage Renal Disease Immunological Medical History: Positive for: Raynauds; Scleroderma - lungs Negative for: Lupus Erythematosus Integumentary (Skin) Medical History: Negative for: History of Burn Oncologic Medical History: Negative for: Received Chemotherapy; Received Radiation Psychiatric [name], [name] ([id number]) [phone].pdf Page 9 of 10 Medical History: Positive for: Confinement Anxiety Negative for: Anorexia/bulimia HBO Extended History Items Eyes: Cataracts Immunizations Pneumococcal Vaccine: Received Pneumococcal Vaccination: Yes Received Pneumococcal Vaccination On or After 60th Birthday: Yes Implantable Devices None Hospitalization / Surgery History Type of Hospitalization/Surgery mouth surgery left shouilder surgery total abdominal hysterectomy Kyphoplasty (L1 and L2) Family and Social History Cancer: No; Diabetes: Yes - Siblings; Heart Disease: Yes - Father; Hereditary Spherocytosis: No; Hypertension: Yes - Mother; Kidney Disease: Yes - Father; Lung Disease: No; Seizures: No; Stroke: Yes - Mother; Thyroid Problems: No; Tuberculosis: No; Former smoker - quit 25 yrs ago; Marital Status - Widowed; Alcohol Use: Moderate; Drug Use: No History; Caffeine Use: Daily -  coffee Social Determinants of Health (SDOH) 1. In the past 2 months, did you or others you live with eat smaller meals or skip meals because you didn't have money for foodo : No 2. Are you homeless or worried that you might be in the futureo : No 3. Do you have trouble paying for your utilities (gas, electricity, phone)o : No 4. Do you have trouble finding or paying for a rideo : No 5. Do you need daycare, or better daycare, for your kidso : No 6. Are you unemployed or without regular incomeo : No 7. Do you need help finding a better jobo : No 8. Do you need help getting more educationo : No 9. Are you concerned about someone in your home using drugs or alcoholo : No 10. Do you feel unsafe in your daily lifeo : No 11. Is anyone in your home threatening or abusing youo : No 12. Do you lack quality relationships that make you feel valued and supportedo : No 13. Do you need help getting cultural information in a language you understando : No 14. Do you need help getting internet accesso : No Advanced Directives and Instructions Spiritual or Cultural beliefs preclude asking about Advance Care Planning: No Advanced Directives: Yes; On file at [facility] Copy Provided: No Do not resuscitate: No Living Will: No Medical Power of Attorney: No Surrogate Decision Maker: No Electronic Signature(s) Signed: 07/17/2023 12:33:32 PM By: Geralyn Corwin DO Entered By: Geralyn Corwin on 07/17/2023 11:27:00 -------------------------------------------------------------------------------- SuperBill  Details Patient Name: Date of Service: [name], [place] [name] 07/17/2023 Medical Record Number: [id number] Patient Account Number: [id number] Date of Birth/Sex: Treating RN: 05-07-1941 (83 y.o. F) Primary Care Provider: Montey Hora Other Clinician: Referring Provider: Treating Provider/Extender: Doreen Salvage in Treatment: 1 Diagnosis Coding Renette Butters ([id number])  [id number] Page 10 of 10 ICD-10 Codes Code Description [phone] Non-pressure chronic ulcer of other part of left lower leg with fat layer exposed I87.312 Chronic venous hypertension (idiopathic) with ulcer of left lower extremity T79.8XXA Other early complications of trauma, initial encounter J84.9 Interstitial pulmonary disease, unspecified Z79.01 Long term (current) use of anticoagulants I48.20 Chronic atrial fibrillation, unspecified Facility Procedures : CPT4 Code: [id number] Description: 11042 - DEB SUBQ TISSUE 20 SQ CM/< ICD-10 Diagnosis Description L97.822 Non-pressure chronic ulcer of other part of left lower leg with fat layer expo I87.312 Chronic venous hypertension (idiopathic) with ulcer of left lower extremity  T79.8XXA Other early complications of trauma, initial encounter Modifier: sed Quantity: 1 Physician Procedures : CPT4 Code Description Modifier [id number] 11042 - WC PHYS SUBQ TISS 20 SQ CM ICD-10 Diagnosis Description L97.822 Non-pressure chronic ulcer of other part of left lower leg with fat layer exposed I87.312 Chronic venous hypertension (idiopathic) with ulcer  of left lower extremity T79.8XXA Other early complications of trauma, initial encounter Quantity: 1 Electronic Signature(s) Signed: 07/17/2023 12:33:32 PM By: Geralyn Corwin DO Entered By: Geralyn Corwin on 07/17/2023 11:43:26

## 2023-07-18 NOTE — Progress Notes (Signed)
[name], [name] ([id number]) [id number] Page 1 of 9 Visit Report for 07/17/2023 Arrival Information Details Patient Name: Date of Service: CO NDO [name], [name]. 07/17/2023 9:30 A M Medical Record Number: [id number] Patient Account Number: [id number] Date of Birth/Sex: Treating RN: 1940-10-07 (83 y.o. [name] Primary Care Tashai Catino: Montey Hora Other Clinician: Referring Macalister Arnaud: Treating Emmert Roethler/Extender: Doreen Salvage in Treatment: 1 Visit Information History Since Last Visit Added or deleted any medications: No Patient Arrived: Ambulatory Any new allergies or adverse reactions: No Arrival Time: 09:26 Had a fall or experienced change in No Accompanied By: daughter activities of daily living that may affect Transfer Assistance: Manual risk of falls: Patient Identification Verified: Yes Signs or symptoms of abuse/neglect since last visito No Secondary Verification Process Completed: Yes Hospitalized since last visit: No Patient Requires Transmission-Based Precautions: No Implantable device outside of the clinic excluding No Patient Has Alerts: Yes cellular tissue based products placed in the center Patient Alerts: Patient on Blood Thinner since last visit: Eliquis Has Dressing in Place as Prescribed: Yes Has Compression in Place as Prescribed: Yes Pain Present Now: No Electronic Signature(s) Signed: 07/17/2023 9:38:55 AM By: Midge Aver MSN RN CNS WTA Entered By: Midge Aver on 07/17/2023 09:38:55 -------------------------------------------------------------------------------- Clinic Level of Care Assessment Details Patient Name: Date of Service: [name], PA Tara J. 07/17/2023 9:30 A M Medical Record Number: [id number] Patient Account Number: [id number] Date of Birth/Sex: Treating RN: 1941/01/26 (83 y.o. [name] Primary Care Felicia Both: Montey Hora Other Clinician: Referring Mckynzie Liwanag: Treating  [place] Searles/Extender: Doreen Salvage in Treatment: 1 Clinic Level of Care Assessment Items TOOL 1 Quantity Score []  - 0 Use when EandM and Procedure is performed on INITIAL visit ASSESSMENTS - Nursing Assessment / Reassessment []  - 0 General Physical Exam (combine w/ comprehensive assessment (listed just below) when performed on new pt. evals) []  - 0 Comprehensive Assessment (HX, ROS, Risk Assessments, Wounds Hx, etc.) ASSESSMENTS - Wound and Skin Assessment / Reassessment []  - 0 Dermatologic / Skin Assessment (not related to wound area) ASSESSMENTS - Ostomy and/or Continence Assessment and Care []  - 0 Incontinence Assessment and Management []  - 0 Ostomy Care Assessment and Management (repouching, etc.) PROCESS - Coordination of Care []  - 0 Simple Patient / Family Education for ongoing care BAKER, YAUCH ([id number]) [id number] Page 2 of 9 []  - 0 Complex (extensive) Patient / Family Education for ongoing care []  - 0 Staff obtains Chiropractor, Records, T Results / Process Orders est []  - 0 Staff telephones HHA, Nursing Homes / Clarify orders / etc []  - 0 Routine Transfer to another Facility (non-emergent condition) []  - 0 Routine Hospital Admission (non-emergent condition) []  - 0 New Admissions / Manufacturing engineer / Ordering NPWT Apligraf, etc. , []  - 0 Emergency Hospital Admission (emergent condition) PROCESS - Special Needs []  - 0 Pediatric / Minor Patient Management []  - 0 Isolation Patient Management []  - 0 Hearing / Language / Visual special needs []  - 0 Assessment of Community assistance (transportation, D/C planning, etc.) []  - 0 Additional assistance / Altered mentation []  - 0 Support Surface(s) Assessment (bed, cushion, seat, etc.) INTERVENTIONS - Miscellaneous []  - 0 External ear exam []  - 0 Patient Transfer (multiple staff / Nurse, adult / Similar devices) []  - 0 Simple Staple / Suture removal  (25 or less) []  - 0 Complex Staple / Suture removal (26 or more) []  - 0 Hypo/Hyperglycemic Management (do not check if billed separately) []  -  0 Ankle / Brachial Index (ABI) - do not check if billed separately Has the patient been seen at the hospital within the last three years: Yes Total Score: 0 Level Of Care: ____ Electronic Signature(s) Signed: 07/17/2023 4:09:14 PM By: Midge Aver MSN RN CNS WTA Entered By: Midge Aver on 07/17/2023 09:57:35 -------------------------------------------------------------------------------- Compression Therapy Details Patient Name: Date of Service: [name], PA Tara J. 07/17/2023 9:30 A M Medical Record Number: [id number] Patient Account Number: [id number] Date of Birth/Sex: Treating RN: 06-11-1941 (83 y.o. [name] Primary Care Ruthie Berch: Montey Hora Other Clinician: Referring Johna Kearl: Treating Delwin Raczkowski/Extender: Doreen Salvage in Treatment: 1 Compression Therapy Performed for Wound Assessment: Wound #2 Left,Anterior Lower Leg Performed By: Clinician Midge Aver, RN Compression Type: Double Layer Post Procedure Diagnosis Same as Pre-procedure Electronic Signature(s) Signed: 07/17/2023 4:09:14 PM By: Midge Aver MSN RN CNS WTA Entered By: Midge Aver on 07/17/2023 09:56:04 Sotomayor, Earnstine Regal ([id number]) [id number] Page 3 of 9 -------------------------------------------------------------------------------- Encounter Discharge Information Details Patient Name: Date of Service: [name], PA Tara J. 07/17/2023 9:30 A M Medical Record Number: [id number] Patient Account Number: [id number] Date of Birth/Sex: Treating RN: [birth year] (83 y.o. [name] Primary Care Logen Fowle: Montey Hora Other Clinician: Referring [name]: Treating Keionte Swicegood/Extender: Doreen Salvage in Treatment: 1 Encounter Discharge Information Items Post Procedure Vitals Discharge  Condition: Stable Temperature (F): 97.5 Ambulatory Status: Ambulatory Pulse (bpm): 54 Discharge Destination: Home Respiratory Rate (breaths/min): 18 Transportation: Private Auto Blood Pressure (mmHg): 124/64 Accompanied By: granddaughter Schedule Follow-up Appointment: Yes Clinical Summary of Care: Electronic Signature(s) Signed: 07/17/2023 4:09:14 PM By: Midge Aver MSN RN CNS WTA Entered By: Midge Aver on 07/17/2023 09:58:49 -------------------------------------------------------------------------------- Lower Extremity Assessment Details Patient Name: Date of Service: [name], PA Tara J. 07/17/2023 9:30 A M Medical Record Number: [id number] Patient Account Number: [id number] Date of Birth/Sex: Treating RN: [birth year] (83 y.o. F) Primary Care Carneshia Raker: Montey Hora Other Clinician: Referring Avaeh Ewer: Treating Kaylana Fenstermacher/Extender: Doreen Salvage in Treatment: 1 Edema Assessment Assessed: [Left: No] [Right: No] Edema: [Left: [name]] [Right: o] Calf Left: Right: Point of Measurement: 29 cm From Medial Instep 31 cm Ankle Left: Right: Point of Measurement: 8 cm From Medial Instep 19.5 cm Vascular Assessment Extremity colors, hair growth, and conditions: Extremity Color: [Left:Normal] Hair Growth on Extremity: [Left:No] Temperature of Extremity: [Left:Cool] Capillary Refill: [Left:< 3 seconds] Dependent Rubor: [Left:No No] Toe Nail Assessment Left: Right: Thick: No Discolored: No Deformed: No LYANA, DISTEL ([id number]) [id number] Page 4 of 9 Improper Length and Hygiene: No Electronic Signature(s) Signed: 07/17/2023 9:39:07 AM By: Midge Aver MSN RN CNS WTA Entered By: Midge Aver on 07/17/2023 09:39:07 -------------------------------------------------------------------------------- Multi Wound Chart Details Patient Name: Date of Service: [name], PA Tara J. 07/17/2023 9:30 A M Medical Record Number:  [id number] Patient Account Number: [id number] Date of Birth/Sex: Treating RN: Jul 11, 1940 (83 y.o. F) Primary Care Lakera Viall: Montey Hora Other Clinician: Referring Davanna He: Treating Anayansi Rundquist/Extender: Doreen Salvage in Treatment: 1 Vital Signs Height(in): 64 Pulse(bpm): 54 Weight(lbs): 133 Blood Pressure(mmHg): 124/64 Body Mass Index(BMI): 22.8 Temperature(F): 97.5 Respiratory Rate(breaths/min): 18 [2:Photos:] [[name]/A:[name]/A] Left, Anterior Lower Leg [name]/A [name]/A Wound Location: Hematoma [name]/A [name]/A Wounding Event: Skin T ear [name]/A [name]/A Primary Etiology: Cataracts, Chronic Obstructive [name]/A [name]/A Comorbid History: Pulmonary Disease (COPD), Arrhythmia, Raynauds, Scleroderma, Confinement Anxiety 06/25/2023 [name]/A [name]/A Date Acquired: 1 [name]/A [name]/A Weeks of Treatment: Open [name]/A [name]/A Wound Status: No [name]/A [name]/A Wound  Recurrence: 2.8x4.3x0.1 [name]/A [name]/A Measurements L x W x D (cm) 9.456 [name]/A [name]/A A (cm) : rea 0.946 [name]/A [name]/A Volume (cm) : 57.00% [name]/A [name]/A % Reduction in A rea: 57.00% [name]/A [name]/A % Reduction in Volume: Full Thickness Without Exposed [name]/A [name]/A Classification: Support Structures Medium [name]/A [name]/A Exudate A mount: Serosanguineous [name]/A [name]/A Exudate Type: red, brown [name]/A [name]/A Exudate Color: Fibrotic scar, thickened scar [name]/A [name]/A Wound Margin: Large (67-100%) [name]/A [name]/A Granulation A mount: Red [name]/A [name]/A Granulation Quality: Small (1-33%) [name]/A [name]/A Necrotic A mount: Fat Layer (Subcutaneous Tissue): Yes [name]/A [name]/A Exposed Structures: Medium (34-66%) [name]/A [name]/A Epithelialization: Debridement - Excisional [name]/A [name]/A Debridement: Pre-procedure Verification/Time Out 09:52 [name]/A [name]/A Taken: Lidocaine 4% Topical Solution [name]/A [name]/A Pain Control: Subcutaneous, Slough [name]/A [name]/A Tissue Debrided: Skin/Subcutaneous Tissue [name]/A [name]/A Level: 9.45 [name]/A [name]/A Debridement A (sq cm): rea Curette [name]/A [name]/A Instrument: Minimum [name]/A [name]/A Bleeding: Pressure [name]/A [name]/A Hemostasis A chievedALYSSA, MAJCHER  ([id number]) [id number] Page 5 of 9 0 [name]/A [name]/A Procedural Pain: 0 [name]/A [name]/A Post Procedural Pain: Procedure was tolerated well [name]/A [name]/A Debridement Treatment Response: 2.8x4.3x0.1 [name]/A [name]/A Post Debridement Measurements L x W x D (cm) 0.946 [name]/A [name]/A Post Debridement Volume: (cm) Scarring: Yes [name]/A [name]/A Periwound Skin Texture: No Abnormalities Noted [name]/A [name]/A Periwound Skin Moisture: No Abnormalities Noted [name]/A [name]/A Periwound Skin Color: No Abnormality [name]/A [name]/A Temperature: Compression Therapy [name]/A [name]/A Procedures Performed: Debridement Treatment Notes Wound #2 (Lower Leg) Wound Laterality: Left, Anterior Cleanser Soap and Water Discharge Instruction: (wash leg in clinic when leg wrappings are on) May shower and wash wound with dial antibacterial soap and water prior to dressing change. Wound Cleanser Discharge Instruction: Cleanse the wound with wound cleanser prior to applying a clean dressing using gauze sponges, not tissue or cotton balls. Peri-Wound Care Sween Lotion (Moisturizing lotion) Discharge Instruction: Apply moisturizing lotion as directed Topical Gentamicin Discharge Instruction: Apply to wound bed and under the skin flap in the middle of the wound Mupirocin Ointment Discharge Instruction: Apply to wound bed and under the skin flap in the middle of the wound Primary Dressing PolyMem Silver Non-Adhesive Dressing, 4.25x4.25 in Discharge Instruction: Apply to wound bed as instructed Secondary Dressing ABD Pad, 8x10 Discharge Instruction: Apply over primary dressing as directed. Secured With Compression Wrap Kerlix Roll 4.5x3.1 (in/yd) Discharge Instruction: Apply Kerlix and Coban compression as directed. Coban Self-Adherent Wrap 4x5 (in/yd) Discharge Instruction: Apply over Kerlix as directed. Compression Stockings Add-Ons Electronic Signature(s) Signed: 07/17/2023 12:33:32 PM By: Geralyn Corwin DO Entered By: Geralyn Corwin on 07/17/2023  11:26:15 -------------------------------------------------------------------------------- Multi-Disciplinary Care Plan Details Patient Name: Date of Service: [name], PA Tara J. 07/17/2023 9:30 A M Medical Record Number: [id number] Patient Account Number: [id number] Date of Birth/Sex: Treating RN: [birth year] (83 y.o. [name] Primary Care Franco Duley: Montey Hora Other Clinician: Referring Dejean Tribby: Treating Monterrio Gerst/Extender: Doreen Salvage in Treatment: 1 Loper, Earnstine Regal ([id number]) [id number] Page 6 of 9 Active Inactive Wound/Skin Impairment Nursing Diagnoses: Impaired tissue integrity Goals: Patient/caregiver will verbalize understanding of skin care regimen Date Initiated: 07/05/2023 Target Resolution Date: 09/25/2023 Goal Status: Active Interventions: Assess ulceration(s) every visit Treatment Activities: Skin care regimen initiated : 07/05/2023 Notes: Electronic Signature(s) Signed: 07/17/2023 4:09:14 PM By: Midge Aver MSN RN CNS WTA Entered By: Midge Aver on 07/17/2023 09:57:02 -------------------------------------------------------------------------------- Pain Assessment Details Patient Name: Date of Service: [name], PA Tara J. 07/17/2023 9:30 A M Medical Record Number: [id number] Patient Account Number: [id number] Date of Birth/Sex: Treating  RN: Jun 16, 1941 (83 y.o. F) Primary Care Rynn Markiewicz: Montey Hora Other Clinician: Referring Bob Eastwood: Treating Vontrell Pullman/Extender: Doreen Salvage in Treatment: 1 Active Problems Location of Pain Severity and Description of Pain Patient Has Paino No Site Locations Pain Management and Medication Current Pain Management: Electronic Signature(s) Signed: 07/17/2023 4:09:14 PM By: Midge Aver MSN RN CNS WTA Entered By: Midge Aver on 07/17/2023 09:39:02 Elmquist, Earnstine Regal ([id number]) [id number] Page 7 of  9 -------------------------------------------------------------------------------- Patient/Caregiver Education Details Patient Name: Date of Service: [name], PA [name] 1/20/2025andnbsp9:30 A M Medical Record Number: [id number] Patient Account Number: [id number] Date of Birth/Gender: Treating RN: 07/16/1940 (83 y.o. [name] Primary Care Physician: Montey Hora Other Clinician: Referring Physician: Treating Physician/Extender: Doreen Salvage in Treatment: 1 Education Assessment Education Provided To: Patient Education Topics Provided Wound/Skin Impairment: Handouts: Caring for Your Ulcer Methods: Explain/Verbal Responses: State content correctly Electronic Signature(s) Signed: 07/17/2023 4:09:14 PM By: Midge Aver MSN RN CNS WTA Entered By: Midge Aver on 07/17/2023 09:57:22 -------------------------------------------------------------------------------- Wound Assessment Details Patient Name: Date of Service: [name], PA Tara J. 07/17/2023 9:30 A M Medical Record Number: [id number] Patient Account Number: [id number] Date of Birth/Sex: Treating RN: [birth year] (83 y.o. F) Primary Care Naethan Bracewell: Montey Hora Other Clinician: Referring Tylene Quashie: Treating Loye Reininger/Extender: Doreen Salvage in Treatment: 1 Wound Status Wound Number: 2 Primary Skin T ear Etiology: Wound Location: Left, Anterior Lower Leg Wound Open Wounding Event: Hematoma Status: Date Acquired: 06/25/2023 Comorbid Cataracts, Chronic Obstructive Pulmonary Disease (COPD), Weeks Of Treatment: 1 History: Arrhythmia, Raynauds, Scleroderma, Confinement Anxiety Clustered Wound: No Photos Wound Measurements Gloeckner, Quintessa J ([id number]) Length: (cm) 2.8 Width: (cm) 4.3 Depth: (cm) 0.1 Area: (cm) 9.456 Volume: (cm) 0.946 [id number] Page 8 of 9 % Reduction in Area: 57% % Reduction in Volume: 57% Epithelialization: Medium  (34-66%) Tunneling: No Undermining: No Wound Description Classification: Full Thickness Without Exposed Support Structures Wound Margin: Fibrotic scar, thickened scar Exudate Amount: Medium Exudate Type: Serosanguineous Exudate Color: red, brown Foul Odor After Cleansing: No Slough/Fibrino Yes Wound Bed Granulation Amount: Large (67-100%) Exposed Structure Granulation Quality: Red Fat Layer (Subcutaneous Tissue) Exposed: Yes Necrotic Amount: Small (1-33%) Necrotic Quality: Adherent Slough Periwound Skin Texture Texture Color No Abnormalities Noted: No No Abnormalities Noted: Yes Scarring: Yes Temperature / Pain Temperature: No Abnormality Moisture No Abnormalities Noted: Yes Treatment Notes Wound #2 (Lower Leg) Wound Laterality: Left, Anterior Cleanser Soap and Water Discharge Instruction: (wash leg in clinic when leg wrappings are on) May shower and wash wound with dial antibacterial soap and water prior to dressing change. Wound Cleanser Discharge Instruction: Cleanse the wound with wound cleanser prior to applying a clean dressing using gauze sponges, not tissue or cotton balls. Peri-Wound Care Sween Lotion (Moisturizing lotion) Discharge Instruction: Apply moisturizing lotion as directed Topical Gentamicin Discharge Instruction: Apply to wound bed and under the skin flap in the middle of the wound Mupirocin Ointment Discharge Instruction: Apply to wound bed and under the skin flap in the middle of the wound Primary Dressing PolyMem Silver Non-Adhesive Dressing, 4.25x4.25 in Discharge Instruction: Apply to wound bed as instructed Secondary Dressing ABD Pad, 8x10 Discharge Instruction: Apply over primary dressing as directed. Secured With Compression Wrap Kerlix Roll 4.5x3.1 (in/yd) Discharge Instruction: Apply Kerlix and Coban compression as directed. Coban Self-Adherent Wrap 4x5 (in/yd) Discharge Instruction: Apply over Kerlix as directed. Compression  Stockings Add-Ons Electronic Signature(s) Signed: 07/18/2023 4:04:36 PM By: Thayer Dallas Entered By: Thayer Dallas on  07/17/2023 09:38:41 Sapien, Earnstine Regal ([id number]) [id number] Page 9 of 9 -------------------------------------------------------------------------------- Vitals Details Patient Name: Date of Service: [name], PA Tara J. 07/17/2023 9:30 A M Medical Record Number: [id number] Patient Account Number: [id number] Date of Birth/Sex: Treating RN: 07/14/1940 (83 y.o. F) Primary Care Taler Kushner: Montey Hora Other Clinician: Referring Jatinder Mcdonagh: Treating Chaia Ikard/Extender: Doreen Salvage in Treatment: 1 Vital Signs Time Taken: 09:36 Temperature (F): 97.5 Height (in): 64 Pulse (bpm): 54 Weight (lbs): 133 Respiratory Rate (breaths/min): 18 Body Mass Index (BMI): 22.8 Blood Pressure (mmHg): 124/64 Reference Range: 80 - 120 mg / dl Electronic Signature(s) Signed: 07/17/2023 9:38:58 AM By: Midge Aver MSN RN CNS WTA Entered By: Midge Aver on 07/17/2023 09:38:58

## 2023-07-19 ENCOUNTER — Encounter (INDEPENDENT_AMBULATORY_CARE_PROVIDER_SITE_OTHER): Payer: Medicare Other | Admitting: Ophthalmology

## 2023-07-22 ENCOUNTER — Other Ambulatory Visit: Payer: Self-pay | Admitting: Internal Medicine

## 2023-07-24 ENCOUNTER — Encounter (HOSPITAL_BASED_OUTPATIENT_CLINIC_OR_DEPARTMENT_OTHER): Payer: Medicare Other | Admitting: Internal Medicine

## 2023-07-24 ENCOUNTER — Ambulatory Visit: Payer: Medicare Other | Admitting: Physician Assistant

## 2023-07-24 DIAGNOSIS — L97822 Non-pressure chronic ulcer of other part of left lower leg with fat layer exposed: Secondary | ICD-10-CM

## 2023-07-24 DIAGNOSIS — I87312 Chronic venous hypertension (idiopathic) with ulcer of left lower extremity: Secondary | ICD-10-CM

## 2023-07-31 ENCOUNTER — Encounter (HOSPITAL_BASED_OUTPATIENT_CLINIC_OR_DEPARTMENT_OTHER): Payer: Medicare Other | Attending: Internal Medicine | Admitting: Internal Medicine

## 2023-07-31 DIAGNOSIS — J439 Emphysema, unspecified: Secondary | ICD-10-CM | POA: Insufficient documentation

## 2023-07-31 DIAGNOSIS — W19XXXA Unspecified fall, initial encounter: Secondary | ICD-10-CM | POA: Insufficient documentation

## 2023-07-31 DIAGNOSIS — Z7901 Long term (current) use of anticoagulants: Secondary | ICD-10-CM | POA: Insufficient documentation

## 2023-07-31 DIAGNOSIS — T798XXA Other early complications of trauma, initial encounter: Secondary | ICD-10-CM | POA: Diagnosis not present

## 2023-07-31 DIAGNOSIS — I7300 Raynaud's syndrome without gangrene: Secondary | ICD-10-CM | POA: Insufficient documentation

## 2023-07-31 DIAGNOSIS — L97822 Non-pressure chronic ulcer of other part of left lower leg with fat layer exposed: Secondary | ICD-10-CM | POA: Insufficient documentation

## 2023-07-31 DIAGNOSIS — M349 Systemic sclerosis, unspecified: Secondary | ICD-10-CM | POA: Insufficient documentation

## 2023-07-31 DIAGNOSIS — I87312 Chronic venous hypertension (idiopathic) with ulcer of left lower extremity: Secondary | ICD-10-CM | POA: Insufficient documentation

## 2023-07-31 DIAGNOSIS — I482 Chronic atrial fibrillation, unspecified: Secondary | ICD-10-CM | POA: Diagnosis not present

## 2023-07-31 DIAGNOSIS — J849 Interstitial pulmonary disease, unspecified: Secondary | ICD-10-CM | POA: Diagnosis not present

## 2023-07-31 DIAGNOSIS — Z7952 Long term (current) use of systemic steroids: Secondary | ICD-10-CM | POA: Diagnosis not present

## 2023-08-03 ENCOUNTER — Telehealth: Payer: Self-pay | Admitting: Internal Medicine

## 2023-08-03 MED ORDER — MYCOPHENOLATE SODIUM 360 MG PO TBEC: 360.0000 mg | DELAYED_RELEASE_TABLET | Freq: Two times a day (BID) | ORAL | 3 refills | Status: AC

## 2023-08-03 NOTE — Telephone Encounter (Signed)
 I called and spoke with pt. Pt states pharmacy does not have RX for Myfortic . Pt states this was suppose to be sent in 07-24-23. Pharmacy needs a new order sent in with refills. Pt states she does have enough to last her until sometime next week. Routing to Pharmacy team.

## 2023-08-03 NOTE — Telephone Encounter (Signed)
 Patient states pharmacy does not have RX for Myfortic . Pharmacy is [facility]. Patient phone number is [phone].

## 2023-08-03 NOTE — Telephone Encounter (Signed)
 Rx was sent on 07/24/2023 and receipt was confirmed by pharmacy on 07/24/2023 @ 0924  I ATC pharmacy but unable to reach rep after waiting. Rx resent to [facility] on [address]   E-Prescribing Status: Receipt confirmed by pharmacy (08/03/2023  4:14 PM EST)    Called patient to advise. She will follow-up with pharmacy  Sherry Pennant, PharmD, MPH, BCPS, CPP Clinical Pharmacist (Rheumatology and Pulmonology)

## 2023-08-07 ENCOUNTER — Encounter (HOSPITAL_BASED_OUTPATIENT_CLINIC_OR_DEPARTMENT_OTHER): Payer: Medicare Other | Admitting: Internal Medicine

## 2023-08-07 DIAGNOSIS — I87312 Chronic venous hypertension (idiopathic) with ulcer of left lower extremity: Secondary | ICD-10-CM | POA: Diagnosis not present

## 2023-08-07 DIAGNOSIS — T798XXA Other early complications of trauma, initial encounter: Secondary | ICD-10-CM

## 2023-08-07 DIAGNOSIS — L97822 Non-pressure chronic ulcer of other part of left lower leg with fat layer exposed: Secondary | ICD-10-CM

## 2023-08-15 ENCOUNTER — Encounter (HOSPITAL_BASED_OUTPATIENT_CLINIC_OR_DEPARTMENT_OTHER): Payer: Medicare Other | Admitting: Internal Medicine

## 2023-08-15 DIAGNOSIS — L97822 Non-pressure chronic ulcer of other part of left lower leg with fat layer exposed: Secondary | ICD-10-CM

## 2023-08-15 DIAGNOSIS — I87312 Chronic venous hypertension (idiopathic) with ulcer of left lower extremity: Secondary | ICD-10-CM | POA: Diagnosis not present

## 2023-08-21 ENCOUNTER — Encounter (HOSPITAL_BASED_OUTPATIENT_CLINIC_OR_DEPARTMENT_OTHER): Payer: Medicare Other | Admitting: Internal Medicine

## 2023-08-21 DIAGNOSIS — I87312 Chronic venous hypertension (idiopathic) with ulcer of left lower extremity: Secondary | ICD-10-CM

## 2023-08-21 DIAGNOSIS — T798XXA Other early complications of trauma, initial encounter: Secondary | ICD-10-CM | POA: Diagnosis not present

## 2023-08-21 DIAGNOSIS — L97822 Non-pressure chronic ulcer of other part of left lower leg with fat layer exposed: Secondary | ICD-10-CM | POA: Diagnosis not present

## 2023-08-24 ENCOUNTER — Encounter (INDEPENDENT_AMBULATORY_CARE_PROVIDER_SITE_OTHER): Payer: Medicare Other | Admitting: Ophthalmology

## 2023-08-24 DIAGNOSIS — D3132 Benign neoplasm of left choroid: Secondary | ICD-10-CM | POA: Diagnosis not present

## 2023-08-24 DIAGNOSIS — H35371 Puckering of macula, right eye: Secondary | ICD-10-CM | POA: Diagnosis not present

## 2023-08-24 DIAGNOSIS — H43813 Vitreous degeneration, bilateral: Secondary | ICD-10-CM

## 2023-08-24 DIAGNOSIS — H353112 Nonexudative age-related macular degeneration, right eye, intermediate dry stage: Secondary | ICD-10-CM | POA: Diagnosis not present

## 2023-08-28 ENCOUNTER — Encounter (HOSPITAL_BASED_OUTPATIENT_CLINIC_OR_DEPARTMENT_OTHER): Payer: Medicare Other | Attending: Internal Medicine | Admitting: Internal Medicine

## 2023-08-28 DIAGNOSIS — L97822 Non-pressure chronic ulcer of other part of left lower leg with fat layer exposed: Secondary | ICD-10-CM | POA: Insufficient documentation

## 2023-08-28 DIAGNOSIS — T798XXA Other early complications of trauma, initial encounter: Secondary | ICD-10-CM | POA: Insufficient documentation

## 2023-08-28 DIAGNOSIS — J849 Interstitial pulmonary disease, unspecified: Secondary | ICD-10-CM | POA: Insufficient documentation

## 2023-08-28 DIAGNOSIS — Z7901 Long term (current) use of anticoagulants: Secondary | ICD-10-CM | POA: Insufficient documentation

## 2023-08-28 DIAGNOSIS — I482 Chronic atrial fibrillation, unspecified: Secondary | ICD-10-CM | POA: Insufficient documentation

## 2023-08-28 DIAGNOSIS — I87312 Chronic venous hypertension (idiopathic) with ulcer of left lower extremity: Secondary | ICD-10-CM | POA: Insufficient documentation

## 2023-09-04 ENCOUNTER — Encounter (HOSPITAL_BASED_OUTPATIENT_CLINIC_OR_DEPARTMENT_OTHER): Payer: Medicare Other | Admitting: Internal Medicine

## 2023-09-04 DIAGNOSIS — I87312 Chronic venous hypertension (idiopathic) with ulcer of left lower extremity: Secondary | ICD-10-CM | POA: Diagnosis not present

## 2023-09-04 DIAGNOSIS — L97822 Non-pressure chronic ulcer of other part of left lower leg with fat layer exposed: Secondary | ICD-10-CM | POA: Diagnosis not present

## 2023-09-07 ENCOUNTER — Encounter: Payer: Self-pay | Admitting: Gastroenterology

## 2023-09-11 ENCOUNTER — Encounter (HOSPITAL_BASED_OUTPATIENT_CLINIC_OR_DEPARTMENT_OTHER): Payer: Medicare Other | Admitting: Internal Medicine

## 2023-09-11 DIAGNOSIS — L97822 Non-pressure chronic ulcer of other part of left lower leg with fat layer exposed: Secondary | ICD-10-CM

## 2023-09-11 DIAGNOSIS — Z7901 Long term (current) use of anticoagulants: Secondary | ICD-10-CM

## 2023-09-11 DIAGNOSIS — T798XXA Other early complications of trauma, initial encounter: Secondary | ICD-10-CM

## 2023-09-11 DIAGNOSIS — I87312 Chronic venous hypertension (idiopathic) with ulcer of left lower extremity: Secondary | ICD-10-CM | POA: Diagnosis not present

## 2023-09-12 ENCOUNTER — Telehealth: Payer: Self-pay | Admitting: Internal Medicine

## 2023-09-12 NOTE — Telephone Encounter (Signed)
 Patient needs another prescription for Prednisone 5mg .  Pharmacy: [facility] on [address]

## 2023-09-13 ENCOUNTER — Encounter: Payer: Self-pay | Admitting: Physician Assistant

## 2023-09-13 ENCOUNTER — Ambulatory Visit: Admitting: Physician Assistant

## 2023-09-13 DIAGNOSIS — M1712 Unilateral primary osteoarthritis, left knee: Secondary | ICD-10-CM | POA: Diagnosis not present

## 2023-09-13 MED ORDER — TRAMADOL HCL 50 MG PO TABS: 50.0000 mg | ORAL_TABLET | Freq: Two times a day (BID) | ORAL | 1 refills | Status: DC | PRN

## 2023-09-13 NOTE — Telephone Encounter (Signed)
 Spoke with the pt  She states that she tapered off pred approx 2 weeks ago  She has developed increased cough and body aches  Cough is non prod  She denies any wheezing or increased SOB  She is asking to start back on pred 5 mg daily  She also wants refill on zofran- not having nausea currently, but wants to keep on hand and no more fills on current rx  Please advise, thanks!

## 2023-09-13 NOTE — Telephone Encounter (Signed)
 I called pt and there was no answer- LMTCB   According to visit with MR 07/06/23 regarding pred rx:  Plan -Continue prednisone but let see if you can handle a taper especially with poor wound healing             -Cut down prednisone to 4 mg/day for 2 weeks and then down to 3 mg a day for 2 weeks and then 2 mg a day for 2 weeks and then 1 mg a day for 2 weeks and then 1 mg on Monday Wednesday Friday for 2 weeks and stop   Will await call back from pt

## 2023-09-13 NOTE — Progress Notes (Signed)
 Office Visit Note   Patient: [name]           Date of Birth: 23-Mar-1941           MRN: [id number] Visit Date: 09/13/2023              Requested by: Sigmund Hazel, MD [address] [place],  [place] [address] PCP: Sigmund Hazel, MD   Assessment & Plan: Visit Diagnoses:  1. Unilateral primary osteoarthritis, left knee     Plan: Impression is left knee osteoarthritis.  Jalacia would like a repeat cortisone injection today, unfortunately she is only a little over 2 months out from her last cortisone injection.  I have offered to request approval from insurance for viscosupplementation injection however she is not interested in this.  She is not ready to proceed with total knee replacement.  She will wait another 3 weeks and then follow-up for cortisone injection.  I have sent in a prescription of tramadol to take as needed.  Call with concerns or questions in the meantime.  Follow-Up Instructions: Return if symptoms worsen or fail to improve.   Orders:  No orders of the defined types were placed in this encounter.  Meds ordered this encounter  Medications   traMADol (ULTRAM) 50 MG tablet    Sig: Take 1 tablet (50 mg total) by mouth every 12 (twelve) hours as needed.    Dispense:  30 tablet    Refill:  1      Procedures: No procedures performed   Clinical Data: No additional findings.   Subjective: Chief Complaint  Patient presents with   Left Knee - Pain    HPI patient is a pleasant 83 year old female who comes in today with recurrent left knee pain.  History of osteoarthritis.  She has undergone multiple cortisone injections with good but temporary relief.  She was last seen by [name] on July 06, 2023 where cortisone injection was performed.  She had great relief until a few weeks ago and her pain started to return.  The majority of her pain occurs going from a seated to standing position as well as when she is walking or sleeping.  She takes an occasional  Tylenol with some relief.  She is unable to take NSAIDs as she is on blood thinners.  She has never undergone viscosupplementation injection but is not interested.  Review of Systems as detailed in HPI.  All others reviewed and are negative.   Objective: Vital Signs: There were no vitals taken for this visit.  Physical Exam well-developed well-nourished female in no acute distress.  Alert and oriented x 3.  Ortho Exam left knee exam: Unchanged.  Specialty Comments:  May need valium pre-ESI  EXAM: MRI LUMBAR SPINE WITHOUT CONTRAST   TECHNIQUE: Multiplanar, multisequence MR imaging of the lumbar spine was performed. No intravenous contrast was administered.   COMPARISON:  MR lumbar 05/01/2015; X-ray lumbar 10/05/2021.   FINDINGS: Segmentation: Standard; the lowest formed disc space is designated L5-S1.   Alignment: There is mild levocurvature centered at L2. Trace anterolisthesis of L4 on L5 is unchanged. There is no other antero or retrolisthesis.   Vertebrae: There is mild compression deformity of the L3 vertebral body with up to approximately 10% loss of vertebral body height anteriorly, new since 2016, but without marrow edema to suggest acute or subacute fracture. The appearance is likely unchanged compared to the radiographs from 01/22/2019. The other vertebral body heights are preserved. Background marrow signal is normal.  There is a small intraosseous hemangioma in the right L5 vertebral body. There is no suspicious marrow signal abnormality or marrow edema.   Conus medullaris and cauda equina: Conus extends to the L1 level. Conus and cauda equina appear normal.   Paraspinal and other soft tissues: Unremarkable.   Disc levels:   There is mild multilevel disc desiccation and narrowing, overall progressed since 2016. There is multilevel facet arthropathy, most advanced at L4-L5 and worse on the right at this level.   T12-L1: No significant spinal canal or  neural foraminal stenosis   L1-L2: There is a mild disc bulge eccentric to the left and mild facet arthropathy resulting in mild left and no significant right neural foraminal stenosis and no significant spinal canal stenosis, not significantly changed.   L2-L3: There is a mild disc bulge and mild bilateral facet arthropathy resulting in mild right and no significant left neural foraminal stenosis without significant spinal canal stenosis minimally progressed since 2016.   L3-L4: There is a mild disc bulge and mild bilateral facet arthropathy resulting in mild narrowing of the left subarticular zone without evidence of frank nerve root impingement and mild left and no significant right neural foraminal stenosis slightly progressed since 2016.   L4-L5: There is a mild disc bulge and mild bilateral facet arthropathy with a trace right effusion without significant spinal canal or neural foraminal stenosis. The facet arthropathy has slightly progressed since 2016   L5-S1: There is a broad-based left subarticular/foraminal protrusion and mild bilateral facet arthropathy. The protrusion abuts but does not appear to impinge the traversing left S1 nerve root. There is mild left and no significant right neural foraminal stenosis. Findings are slightly progressed since 2016.   IMPRESSION: 1. Mild anterior compression deformity of the L3 vertebral body is new since 2016 but without marrow edema to suggest acute fracture, similar in appearance to the radiographs from 2020. Otherwise, no evidence of acute injury in the lumbar spine. 2. Multilevel facet arthropathy, most advanced at L4-L5 and worse on the right, slightly progressed since 2016. 3. Otherwise, overall mild multilevel degenerative changes as detailed above have slightly progressed since 2016, but with no high-grade spinal canal or neural foraminal stenosis, and no evidence of nerve root impingement to explain the  patient's right-sided pain. 4. Mild levoscoliosis centered at L2.     Electronically Signed By: Lesia Hausen M.D. On: 10/11/2021 10:04  Imaging: No results found.   PMFS History: Patient Active Problem List   Diagnosis Date Noted   Malnutrition of moderate degree 06/25/2022   Closed compression fracture of L2 lumbar vertebra, initial encounter (HCC) 06/21/2022   Intractable back pain 06/20/2022   High risk medication use 02/18/2019   Therapeutic drug monitoring 02/18/2019   Healthcare maintenance 02/18/2019   ILD (interstitial lung disease) (HCC) 11/28/2016   Multiple lung nodules on CT 11/28/2016   Abnormal laboratory test 11/18/2016   DDD L spine 11/18/2016   Scl-70 antibody positive 11/10/2016   History of hypothyroidism 11/10/2016   History of hemochromatosis 11/10/2016   Pulmonary emphysema (HCC) 08/31/2016   [name] phenomenon without gangrene 08/31/2016   Sinusitis, chronic 08/31/2016   Chronic cough 02/10/2016   Irritable larynx 02/10/2016   Stopped smoking with greater than 40 pack year history 02/10/2016   Chest pain, unspecified 10/06/2010   Shortness of breath 10/06/2010   Hypothyroidism    Hereditary hemochromatosis (HCC) 04/22/2010   ATRIAL FIBRILLATION 10/20/2008   PAC 10/20/2008   SYNCOPE AND COLLAPSE 10/20/2008   Past  Medical History:  Diagnosis Date   Atrial fibrillation (HCC)    a. Flecainide therapy;  b. event monitor 4/12   Chest pain    a. GXT myoview 4/12: no isch., EF 86%;   b. 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 Cyndie Chime   History of syncope    Hypothyroidism    Osteoporosis     Family History  Problem Relation Age of Onset   Stroke Mother    Heart disease Father 78   Hemochromatosis Brother    Breast cancer Neg Hx     Past Surgical History:  Procedure Laterality Date   IR KYPHO  LUMBAR INC FX REDUCE BONE BX UNI/BIL CANNULATION INC/IMAGING  06/30/2022   IR KYPHO LUMBAR INC FX REDUCE BONE BX UNI/BIL CANNULATION INC/IMAGING  08/16/2022   MOUTH SURGERY     SHOULDER ARTHROSCOPY Left    TOTAL ABDOMINAL HYSTERECTOMY     VARICOSE VEIN SURGERY     Social History   Occupational History   Occupation: RETIRED    Employer: RETIRED  Tobacco Use   Smoking status: Former    Current packs/day: 0.00    Average packs/day: 1 pack/day for 42.0 years (42.0 ttl pk-yrs)    Types: Cigarettes    Start date: 06/28/1955    Quit date: 06/27/1997    Years since quitting: 26.2    Passive exposure: Never   Smokeless tobacco: Never  Vaping Use   Vaping status: Never Used  Substance and Sexual Activity   Alcohol use: Yes    Comment: occ   Drug use: No   Sexual activity: Not on file

## 2023-09-18 ENCOUNTER — Ambulatory Visit (HOSPITAL_BASED_OUTPATIENT_CLINIC_OR_DEPARTMENT_OTHER): Payer: Medicare Other | Admitting: Internal Medicine

## 2023-09-19 ENCOUNTER — Encounter (HOSPITAL_BASED_OUTPATIENT_CLINIC_OR_DEPARTMENT_OTHER): Admitting: Internal Medicine

## 2023-09-19 DIAGNOSIS — L97822 Non-pressure chronic ulcer of other part of left lower leg with fat layer exposed: Secondary | ICD-10-CM

## 2023-09-19 DIAGNOSIS — T798XXA Other early complications of trauma, initial encounter: Secondary | ICD-10-CM | POA: Diagnosis not present

## 2023-09-19 DIAGNOSIS — I87312 Chronic venous hypertension (idiopathic) with ulcer of left lower extremity: Secondary | ICD-10-CM | POA: Diagnosis not present

## 2023-09-19 DIAGNOSIS — Z7901 Long term (current) use of anticoagulants: Secondary | ICD-10-CM

## 2023-09-19 NOTE — Telephone Encounter (Signed)
 Patient is calling for an update on her prednisone and Zofran. She is anxiously waiting a response.

## 2023-09-19 NOTE — Telephone Encounter (Signed)
 Spoke to pt. Coughing is the same, still non prod.  Per Leslies previous message pt is wanting to start back 5 mg prednisone & refill for zofran to keep on hand. Not having any nausea issues right now.  Pharmacy is [address] Please advise Dr. Marchelle Gearing

## 2023-09-25 ENCOUNTER — Encounter (HOSPITAL_BASED_OUTPATIENT_CLINIC_OR_DEPARTMENT_OTHER): Admitting: Internal Medicine

## 2023-09-27 NOTE — Telephone Encounter (Signed)
 Copied from CRM [id number]. Topic: Clinical - Prescription Issue >> Sep 27, 2023  2:06 PM Shelbie Proctor wrote: Reason for CRM: Patient [id number] has not heard from the office about 2 weeks now, regarding medications: predniSONE (DELTASONE) 5 MG tablet and ondansetron (ZOFRAN) 4 MG tablet. Please advise and call back.   [facility]ORE #[id number] [place], [place] - [address] AT [facility] OF [facility] [address] & [facility] [address] [place] [address] Phone: [phone] Fax: [phone]   I called and spoke to pt. Pt states she was told to stop taking Prednisone by Dr Marchelle Gearing, but pt would like to go back on it. Pt states her cough is worst, and pt states she has been off of Zofran for a while but needs a new RX for this as well for as needed.  Dr Marchelle Gearing, please advise.

## 2023-10-02 NOTE — Telephone Encounter (Unsigned)
 Copied from CRM [id number]. Topic: Clinical - Prescription Issue >> Sep 27, 2023  2:06 PM Shelbie Proctor wrote: Reason for CRM: Patient [phone] has not heard from the office about 2 weeks now, regarding medications: predniSONE (DELTASONE) 5 MG tablet and ondansetron (ZOFRAN) 4 MG tablet. Please advise and call back.   [facility] DR[facility] STORE #[id number] [place], [place] - [address] AT [facility] OF [facility] [address] & [facility] [address] [place] [address] [phone]:[phone] >> Oct 02, 2023 12:00 PM Gaetano Hawthorne wrote: Patient is checking back regarding the status of their request - she spoke with a nurse last week who said they would speak with Dr. Marchelle Gearing. Please call patient when you have a moment.  >> Sep 28, 2023  8:52 AM Nila Nephew wrote: Patient's daughter calling in to urge practice to send this ASAP. States patient is vomiting because of how much she is coughing.

## 2023-10-02 NOTE — Telephone Encounter (Signed)
 Copied from CRM [id number]. Topic: Clinical - Prescription Issue >> Oct 02, 2023 12:00 PM Gaetano Hawthorne wrote: Patient is checking back regarding the status of their request - she spoke with a nurse last week who said they would speak with Dr. Marchelle Gearing. Please call patient when you have a moment.   Per previous messages pt is requesting to start back on Prednisone 5mg  & refill for Zofran. Please advise Dr. Marchelle Gearing pt would like to get back on Prednisone.

## 2023-10-03 ENCOUNTER — Telehealth: Payer: Self-pay

## 2023-10-03 DIAGNOSIS — T50905A Adverse effect of unspecified drugs, medicaments and biological substances, initial encounter: Secondary | ICD-10-CM

## 2023-10-03 DIAGNOSIS — Z79899 Other long term (current) drug therapy: Secondary | ICD-10-CM

## 2023-10-03 DIAGNOSIS — R768 Other specified abnormal immunological findings in serum: Secondary | ICD-10-CM

## 2023-10-03 DIAGNOSIS — J849 Interstitial pulmonary disease, unspecified: Secondary | ICD-10-CM

## 2023-10-03 MED ORDER — PREDNISONE 5 MG PO TABS: 5.0000 mg | ORAL_TABLET | Freq: Every day | ORAL | 1 refills | Status: DC

## 2023-10-03 MED ORDER — ONDANSETRON HCL 4 MG PO TABS: 4.0000 mg | ORAL_TABLET | Freq: Three times a day (TID) | ORAL | 3 refills | Status: DC | PRN

## 2023-10-03 MED ORDER — ONDANSETRON HCL 4 MG PO TABS: 4.0000 mg | ORAL_TABLET | Freq: Three times a day (TID) | ORAL | 3 refills | Status: AC | PRN

## 2023-10-03 NOTE — Telephone Encounter (Signed)
 Copied from CRM [id number]. Topic: Clinical - Prescription Issue >> Sep 27, 2023  2:06 PM Shelbie Proctor wrote: Reason for CRM: Patient [phone] has not heard from the office about 2 weeks now, regarding medications: predniSONE (DELTASONE) 5 MG tablet and ondansetron (ZOFRAN) 4 MG tablet. Please advise and call back.   [facility] DR[facility] STORE #[id number] [place], [place] - [address] AT [facility] OF [facility] [address] & [facility] [address] [place] [address] Phone: [phone] Fax: [phone] >> Oct 03, 2023  3:48 PM Nila Nephew wrote: Patient is requesting somebody to handle this ASAP. Patient is upset that she has not heard yet and would like some type of communication as to what the hold up is. Please call patient back.  >> Oct 02, 2023 12:00 PM Gaetano Hawthorne wrote: Patient is checking back regarding the status of their request - she spoke with a nurse last week who said they would speak with Dr. Marchelle Gearing. Please call patient when you have a moment.  >> Sep 28, 2023  8:52 AM Nila Nephew wrote: Patient's daughter calling in to urge practice to send this ASAP. States patient is vomiting because of how much she is coughing.   Pt states she has had vomiting. Pt states she feels nauseated and has diarrhea. Pt states she would like to get back on prednisone ASAP. Pt states she is feeling the effects of being off of it after being on it for 7 years.  Pt uses [facility] on [address]

## 2023-10-03 NOTE — Telephone Encounter (Signed)
 I called and spoke with the pt and and notified of response per MR  She verbalized understanding  She asked that we cancel the rxs to [facility] and send to [facility]  I have taken care of this for her  Nothing further needed

## 2023-10-03 NOTE — Telephone Encounter (Signed)
 I put her on a slow prednisone taper early January 2025 with the hope of getting her off prednisone because of some wound healing issues in the left lower extremity.  I understand she is having prednisone withdrawal now.  I sincerely apologize for all the delay.  I somehow missed this message  Plan  -Start prednisone at 5 mg/day -Okay to refill Zofran at 4 mg 3 times daily as needed  - sent to mail order  Pls apologize Pls ensure I did order correctly Pls ensure they are ok with the mail order     SIGNATURE    Dr. Kalman Shan, M.D., F.C.C.P,  Pulmonary and Critical Care Medicine Staff Physician, [facility] Director - Interstitial Lung Disease  Program  Pulmonary Fibrosis [facility] at [facility] [place], [place], [address]   Pager: [phone], If no answer  -> Check AMION or Try [phone] Telephone (clinical office): [phone] Telephone (research): [phone]  4:43 PM 10/03/2023

## 2023-10-04 ENCOUNTER — Ambulatory Visit
Admission: RE | Admit: 2023-10-04 | Discharge: 2023-10-04 | Disposition: A | Discharge status: Home / Self Care | Payer: Medicare Other | Source: Ambulatory Visit | Attending: Internal Medicine | Admitting: Internal Medicine

## 2023-10-04 ENCOUNTER — Ambulatory Visit: Admitting: Physician Assistant

## 2023-10-04 DIAGNOSIS — Z7952 Long term (current) use of systemic steroids: Secondary | ICD-10-CM

## 2023-10-04 DIAGNOSIS — J849 Interstitial pulmonary disease, unspecified: Secondary | ICD-10-CM

## 2023-10-04 DIAGNOSIS — I7300 Raynaud's syndrome without gangrene: Secondary | ICD-10-CM

## 2023-10-04 DIAGNOSIS — Z5181 Encounter for therapeutic drug level monitoring: Secondary | ICD-10-CM

## 2023-10-04 DIAGNOSIS — R768 Other specified abnormal immunological findings in serum: Secondary | ICD-10-CM

## 2023-10-04 DIAGNOSIS — J432 Centrilobular emphysema: Secondary | ICD-10-CM

## 2023-10-04 DIAGNOSIS — Z79899 Other long term (current) drug therapy: Secondary | ICD-10-CM

## 2023-10-04 DIAGNOSIS — M349 Systemic sclerosis, unspecified: Secondary | ICD-10-CM

## 2023-10-05 ENCOUNTER — Telehealth: Payer: Self-pay | Admitting: *Deleted

## 2023-10-05 ENCOUNTER — Ambulatory Visit: Admitting: Orthopaedic Surgery

## 2023-10-05 DIAGNOSIS — M1712 Unilateral primary osteoarthritis, left knee: Secondary | ICD-10-CM | POA: Diagnosis not present

## 2023-10-05 MED ORDER — BUPIVACAINE HCL 0.5 % IJ SOLN
2.0000 mL | INTRAMUSCULAR | Status: AC | PRN
  Administered 2023-10-05: 2 mL via INTRA_ARTICULAR

## 2023-10-05 MED ORDER — METHYLPREDNISOLONE ACETATE 40 MG/ML IJ SUSP
40.0000 mg | INTRAMUSCULAR | Status: AC | PRN
  Administered 2023-10-05: 40 mg via INTRA_ARTICULAR

## 2023-10-05 MED ORDER — LIDOCAINE HCL 1 % IJ SOLN
2.0000 mL | INTRAMUSCULAR | Status: AC | PRN
  Administered 2023-10-05: 2 mL

## 2023-10-05 NOTE — Telephone Encounter (Signed)
 Copied from CRM [id number]. Topic: Clinical - Prescription Issue >> Oct 04, 2023  2:02 PM Para March B wrote: Reason for CRM:  Patient talked to [place]. Predinsone Refill was sent to [facility] pharmacy. Patient would like office to cancel the [facility] refill and have new scrip sent to the correct pharmacy so she can pick it up. Correct Pharmacy:  [facility] DR[facility] STORE #[id number] [place], [place] - [address] AT [facility] OF [facility] [address] & [facility] [address] [place] [address] Phone: [phone] Fax: [phone]  The pred rx was [facility] to [facility] on 10/03/23  We had already cancelled rx with [facility] and sent pred to [facility]  I called and spoke with the pt and notified her of this  She has already picked up rx for [facility]  Nothing further needed

## 2023-10-05 NOTE — Progress Notes (Signed)
 Office Visit Note   Patient: [name]           Date of Birth: 1940-10-14           MRN: [id number] Visit Date: 10/05/2023              Requested by: Sigmund Hazel, MD [address] [place],  [place] [address] PCP: Sigmund Hazel, MD   Assessment & Plan: Visit Diagnoses:  1. Primary osteoarthritis of left knee     Plan: [name] is a 82 year old female with left knee osteoarthritis.  Previous injection gave excellent relief.  We repeated a cortisone injection today.  She tolerated this well.  Will see her back as needed.  Follow-Up Instructions: No follow-ups on file.   Orders:  No orders of the defined types were placed in this encounter.  No orders of the defined types were placed in this encounter.     Procedures: Large Joint Inj: L knee on 10/05/2023 12:48 PM Details: 22 G needle Medications: 2 mL bupivacaine 0.5 %; 2 mL lidocaine 1 %; 40 mg methylPREDNISolone acetate 40 MG/ML Outcome: tolerated well, no immediate complications Patient was prepped and draped in the usual sterile fashion.       Clinical Data: No additional findings.   Subjective: Chief Complaint  Patient presents with   Left Knee - Pain    HPI [name] returns today for follow-up evaluation of left knee osteoarthritis.  Underwent cortisone injection about 3 months ago and felt good relief until recently.  She would like to try another 1. Review of Systems   Objective: Vital Signs: There were no vitals taken for this visit.  Physical Exam  Ortho Exam Exam of the left knee is unchanged from prior visit. Specialty Comments:  May need valium pre-ESI  EXAM: MRI LUMBAR SPINE WITHOUT CONTRAST   TECHNIQUE: Multiplanar, multisequence MR imaging of the lumbar spine was performed. No intravenous contrast was administered.   COMPARISON:  MR lumbar 05/01/2015; X-ray lumbar 10/05/2021.   FINDINGS: Segmentation: Standard; the lowest formed disc space is designated L5-S1.    Alignment: There is mild levocurvature centered at L2. Trace anterolisthesis of L4 on L5 is unchanged. There is no other antero or retrolisthesis.   Vertebrae: There is mild compression deformity of the L3 vertebral body with up to approximately 10% loss of vertebral body height anteriorly, new since 2016, but without marrow edema to suggest acute or subacute fracture. The appearance is likely unchanged compared to the radiographs from 01/22/2019. The other vertebral body heights are preserved. Background marrow signal is normal. There is a small intraosseous hemangioma in the right L5 vertebral body. There is no suspicious marrow signal abnormality or marrow edema.   Conus medullaris and cauda equina: Conus extends to the L1 level. Conus and cauda equina appear normal.   Paraspinal and other soft tissues: Unremarkable.   Disc levels:   There is mild multilevel disc desiccation and narrowing, overall progressed since 2016. There is multilevel facet arthropathy, most advanced at L4-L5 and worse on the right at this level.   T12-L1: No significant spinal canal or neural foraminal stenosis   L1-L2: There is a mild disc bulge eccentric to the left and mild facet arthropathy resulting in mild left and no significant right neural foraminal stenosis and no significant spinal canal stenosis, not significantly changed.   L2-L3: There is a mild disc bulge and mild bilateral facet arthropathy resulting in mild right and no significant left neural foraminal stenosis without  significant spinal canal stenosis minimally progressed since 2016.   L3-L4: There is a mild disc bulge and mild bilateral facet arthropathy resulting in mild narrowing of the left subarticular zone without evidence of frank nerve root impingement and mild left and no significant right neural foraminal stenosis slightly progressed since 2016.   L4-L5: There is a mild disc bulge and mild bilateral facet arthropathy  with a trace right effusion without significant spinal canal or neural foraminal stenosis. The facet arthropathy has slightly progressed since 2016   L5-S1: There is a broad-based left subarticular/foraminal protrusion and mild bilateral facet arthropathy. The protrusion abuts but does not appear to impinge the traversing left S1 nerve root. There is mild left and no significant right neural foraminal stenosis. Findings are slightly progressed since 2016.   IMPRESSION: 1. Mild anterior compression deformity of the L3 vertebral body is new since 2016 but without marrow edema to suggest acute fracture, similar in appearance to the radiographs from 2020. Otherwise, no evidence of acute injury in the lumbar spine. 2. Multilevel facet arthropathy, most advanced at L4-L5 and worse on the right, slightly progressed since 2016. 3. Otherwise, overall mild multilevel degenerative changes as detailed above have slightly progressed since 2016, but with no high-grade spinal canal or neural foraminal stenosis, and no evidence of nerve root impingement to explain the patient's right-sided pain. 4. Mild levoscoliosis centered at L2.     Electronically Signed By: Lesia Hausen M.D. On: 10/11/2021 10:04  Imaging: No results found.   PMFS History: Patient Active Problem List   Diagnosis Date Noted   Malnutrition of moderate degree 06/25/2022   Closed compression fracture of L2 lumbar vertebra, initial encounter (HCC) 06/21/2022   Intractable back pain 06/20/2022   High risk medication use 02/18/2019   Therapeutic drug monitoring 02/18/2019   Healthcare maintenance 02/18/2019   ILD (interstitial lung disease) (HCC) 11/28/2016   Multiple lung nodules on CT 11/28/2016   Abnormal laboratory test 11/18/2016   DDD L spine 11/18/2016   Scl-70 antibody positive 11/10/2016   History of hypothyroidism 11/10/2016   History of hemochromatosis 11/10/2016   Pulmonary emphysema (HCC) 08/31/2016    [name] phenomenon without gangrene 08/31/2016   Sinusitis, chronic 08/31/2016   Chronic cough 02/10/2016   Irritable larynx 02/10/2016   Stopped smoking with greater than 40 pack year history 02/10/2016   Chest pain, unspecified 10/06/2010   Shortness of breath 10/06/2010   Hypothyroidism    Hereditary hemochromatosis (HCC) 04/22/2010   ATRIAL FIBRILLATION 10/20/2008   PAC 10/20/2008   SYNCOPE AND COLLAPSE 10/20/2008   Past Medical History:  Diagnosis Date   Atrial fibrillation (HCC)    a. Flecainide therapy;  b. event monitor 4/12   Chest pain    a. GXT myoview 4/12: no isch., EF 86%;   b. 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 Cyndie Chime   History of syncope    Hypothyroidism    Osteoporosis     Family History  Problem Relation Age of Onset   Stroke Mother    Heart disease Father 37   Hemochromatosis Brother    Breast cancer Neg Hx     Past Surgical History:  Procedure Laterality Date   IR KYPHO LUMBAR INC FX REDUCE BONE BX UNI/BIL CANNULATION INC/IMAGING  06/30/2022   IR KYPHO LUMBAR INC FX REDUCE BONE BX  UNI/BIL CANNULATION INC/IMAGING  08/16/2022   MOUTH SURGERY     SHOULDER ARTHROSCOPY Left    TOTAL ABDOMINAL HYSTERECTOMY     VARICOSE VEIN SURGERY     Social History   Occupational History   Occupation: RETIRED    Employer: RETIRED  Tobacco Use   Smoking status: Former    Current packs/day: 0.00    Average packs/day: 1 pack/day for 42.0 years (42.0 ttl pk-yrs)    Types: Cigarettes    Start date: 06/28/1955    Quit date: 06/27/1997    Years since quitting: 26.2    Passive exposure: Never   Smokeless tobacco: Never  Vaping Use   Vaping status: Never Used  Substance and Sexual Activity   Alcohol use: Yes    Comment: occ   Drug use: No   Sexual activity: Not on file

## 2023-10-09 NOTE — Progress Notes (Deleted)
 Office Visit Note  Patient: [name]             Date of Birth: 07-Dec-1940           MRN: [id number]             PCP: Sigmund Hazel, MD Referring: Sigmund Hazel, MD Visit Date: 10/23/2023 Occupation: @GUAROCC @  Subjective:  No chief complaint on file.   History of Present Illness: [name] is a 83 y.o. female ***     Activities of Daily Living:  Patient reports morning stiffness for *** {minute/hour:19697}.   Patient {ACTIONS;DENIES/REPORTS:21021675::"Denies"} nocturnal pain.  Difficulty dressing/grooming: {ACTIONS;DENIES/REPORTS:21021675::"Denies"} Difficulty climbing stairs: {ACTIONS;DENIES/REPORTS:21021675::"Denies"} Difficulty getting out of chair: {ACTIONS;DENIES/REPORTS:21021675::"Denies"} Difficulty using hands for taps, buttons, cutlery, and/or writing: {ACTIONS;DENIES/REPORTS:21021675::"Denies"}  No Rheumatology ROS completed.   PMFS History:  Patient Active Problem List   Diagnosis Date Noted   Malnutrition of moderate degree 06/25/2022   Closed compression fracture of L2 lumbar vertebra, initial encounter (HCC) 06/21/2022   Intractable back pain 06/20/2022   High risk medication use 02/18/2019   Therapeutic drug monitoring 02/18/2019   Healthcare maintenance 02/18/2019   ILD (interstitial lung disease) (HCC) 11/28/2016   Multiple lung nodules on CT 11/28/2016   Abnormal laboratory test 11/18/2016   DDD L spine 11/18/2016   Scl-70 antibody positive 11/10/2016   History of hypothyroidism 11/10/2016   History of hemochromatosis 11/10/2016   Pulmonary emphysema (HCC) 08/31/2016   [name] phenomenon without gangrene 08/31/2016   Sinusitis, chronic 08/31/2016   Chronic cough 02/10/2016   Irritable larynx 02/10/2016   Stopped smoking with greater than 40 pack year history 02/10/2016   Chest pain, unspecified 10/06/2010   Shortness of breath 10/06/2010   Hypothyroidism    Hereditary hemochromatosis (HCC) 04/22/2010   ATRIAL FIBRILLATION  10/20/2008   PAC 10/20/2008   SYNCOPE AND COLLAPSE 10/20/2008    Past Medical History:  Diagnosis Date   Atrial fibrillation (HCC)    a. Flecainide therapy;  b. event monitor 4/12   Chest pain    a. GXT myoview 4/12: no isch., EF 86%;   b. 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 Cyndie Chime   History of syncope    Hypothyroidism    Osteoporosis     Family History  Problem Relation Age of Onset   Stroke Mother    Heart disease Father 14   Hemochromatosis Brother    Breast cancer Neg Hx    Past Surgical History:  Procedure Laterality Date   IR KYPHO LUMBAR INC FX REDUCE BONE BX UNI/BIL CANNULATION INC/IMAGING  06/30/2022   IR KYPHO LUMBAR INC FX REDUCE BONE BX UNI/BIL CANNULATION INC/IMAGING  08/16/2022   MOUTH SURGERY     SHOULDER ARTHROSCOPY Left    TOTAL ABDOMINAL HYSTERECTOMY     VARICOSE VEIN SURGERY     Social History   Social History Narrative   REGULAR EXERCISE   Immunization History  Administered Date(s) Administered   Fluad Quad(high Dose 65+) 02/18/2019, 04/21/2022   Influenza Split 03/27/2019   Influenza, High Dose Seasonal PF 08/29/2017, 04/26/2018, 04/29/2020, 05/12/2021   PFIZER(Purple Top)SARS-COV-2 Vaccination 07/22/2019, 08/12/2019, 03/11/2020   Pneumococcal Conjugate-13 06/12/2014   Pneumococcal Polysaccharide-23 06/27/2010, 04/25/2018   Tdap 03/16/2011, 07/05/2019   Zoster Recombinant(Shingrix) 04/08/2019   Zoster, Live 06/27/2010, 04/08/2019     Objective: Vital Signs: There were no  vitals taken for this visit.   Physical Exam   Musculoskeletal Exam: ***  CDAI Exam: CDAI Score: -- Patient Global: --; Provider Global: -- Swollen: --; Tender: -- Joint Exam 10/23/2023   No joint exam has been documented for this visit   There is currently no information documented on the homunculus. Go to the  Rheumatology activity and complete the homunculus joint exam.  Investigation: No additional findings.  Imaging: No results found.  Recent Labs: Lab Results  Component Value Date   WBC 6.9 04/20/2023   HGB 13.0 04/20/2023   PLT 193 04/20/2023   NA 139 04/20/2023   K 4.2 04/20/2023   CL 103 04/20/2023   CO2 30 04/20/2023   GLUCOSE 105 (H) 04/20/2023   BUN 13 04/20/2023   CREATININE 0.81 04/20/2023   BILITOT 0.9 04/20/2023   ALKPHOS 50 04/20/2023   AST 19 04/20/2023   ALT 18 04/20/2023   PROT 6.6 04/20/2023   ALBUMIN 4.3 04/20/2023   CALCIUM 9.5 04/20/2023   GFRAA 101 06/09/2020   QFTBGOLDPLUS NEGATIVE 08/10/2017    Speciality Comments: Reclast IV August 23, 2022  Procedures:  No procedures performed Allergies: Patient has no known allergies.   Assessment / Plan:     Visit Diagnoses: Scleroderma (HCC)  Anticardiolipin antibody positive  ILD (interstitial lung disease) (HCC)  High risk medication use  Long term systemic steroid user  Primary osteoarthritis of both hands  Trochanteric bursitis of both hips  Spondylosis of lumbar spine  Age-related osteoporosis without current pathological fracture  Medication monitoring encounter  Other closed fracture of lumbar vertebra, L1, L2 sequela  Multiple lung nodules on CT  History of atrial fibrillation  History of emphysema (HCC)  Former smoker  Vitamin D deficiency  Low ferritin  History of hemochromatosis  History of hypothyroidism  Orders: No orders of the defined types were placed in this encounter.  No orders of the defined types were placed in this encounter.   Face-to-face time spent with patient was *** minutes. Greater than 50% of time was spent in counseling and coordination of care.  Follow-Up Instructions: No follow-ups on file.   Romayne Clubs, PA-C  Note - This record has been created using Dragon software.  Chart creation errors have been sought, but may not always   have been located. Such creation errors do not reflect on  the standard of medical care.

## 2023-10-18 ENCOUNTER — Other Ambulatory Visit: Payer: Self-pay | Admitting: Hematology and Oncology

## 2023-10-18 DIAGNOSIS — D500 Iron deficiency anemia secondary to blood loss (chronic): Secondary | ICD-10-CM

## 2023-10-18 NOTE — Progress Notes (Unsigned)
 [facility] Telephone:([phone]) [phone]   Fax:(336) [phone]  PROGRESS NOTE  Patient Care Team: Perley Bradley, MD as PCP - General (Family Medicine) Verona Goodwill, MD as PCP - Cardiology (Cardiology) Verona Goodwill, MD as Consulting Physician (Cardiology) Serafin Dames, MD as Consulting Physician (Gastroenterology) Ander Bame, MD as Consulting Physician (Hematology and Oncology)  Hematological/Oncological History # Iron Deficiency Anemia  # Hereditary Hemochromatosis C282Y Homozygous 09/11/2017: last visit with Dr. Isidor Marek 05/09/2022: establish care with Dr. Rosaline Coma   Interval History:  [name] 83 y.o. female with medical history significant for iron deficiency anemia in setting of hereditary hemochromatosis who presents for a follow up visit. The patient's last visit was on 10/10/2022 at which time she established care. In the interim since the last visit she has continued on PO iron therapy.  On exam today [name] reports she has been well overall and interim since her last visit though she did have an episode of vertigo.  She reports that she continues to follow with her cardiologist and also did recently have an atrial fibrillation episode.  She reports her energy levels are good and currently an 8 out of 10.  Her appetite has been strong.  She is not having any shortness of breath or headache.  She denies any nausea or vomiting but does have chronic issues with diarrhea.  She reports that she is not having any runny nose, sore throat, or cough.  She has been having some occasional issues with allergies over the summer.  Nothing else has been out of the ordinary.  She has not been taking any iron pills as we instructed previously.  She notes that she is not having any lightheadedness, dizziness, or shortness of breath.  A full 10 point ROS was otherwise negative.  MEDICAL HISTORY:  Past Medical History:  Diagnosis Date   Atrial fibrillation (HCC)     a. Flecainide  therapy;  b. event monitor 4/12   Chest pain    a. GXT myoview  4/12: no isch., EF 86%;   b. 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 Isidor Marek   History of syncope    Hypothyroidism    Osteoporosis     SURGICAL HISTORY: Past Surgical History:  Procedure Laterality Date   IR KYPHO LUMBAR INC FX REDUCE BONE BX UNI/BIL CANNULATION INC/IMAGING  06/30/2022   IR KYPHO LUMBAR INC FX REDUCE BONE BX UNI/BIL CANNULATION INC/IMAGING  08/16/2022   MOUTH SURGERY     SHOULDER ARTHROSCOPY Left    TOTAL ABDOMINAL HYSTERECTOMY     VARICOSE VEIN SURGERY      SOCIAL HISTORY: Social History   Socioeconomic History   Marital status: Married    Spouse name: Not on file   Number of children: Not on file   Years of education: Not on file   Highest education level: Not on file  Occupational History   Occupation: RETIRED    Employer: RETIRED  Tobacco Use   Smoking status: Former    Current packs/day: 0.00    Average packs/day: 1 pack/day for 42.0 years (42.0 ttl pk-yrs)    Types: Cigarettes    Start date: 06/28/1955    Quit date: 06/27/1997    Years since quitting: 26.3    Passive exposure: Never   Smokeless tobacco: Never  Vaping Use  Vaping status: Never Used  Substance and Sexual Activity   Alcohol  use: Yes    Comment: occ   Drug use: No   Sexual activity: Not on file  Other Topics Concern   Not on file  Social History Narrative   REGULAR EXERCISE   Social Drivers of Health   Financial Resource Strain: Low Risk  (02/17/2022)   [facility]   Financial Resource Strain    In the past 12 months, have you experienced difficulty paying for basic needs like housing, utilities, food, transportation, or clothing: Not on file    Are there any financial concerns that limit you from seeing the  doctor?: Not on file  Food Insecurity: Low Risk  (01/06/2023)   Received from [facility]   Hunger Vital Sign    Worried About Running Out of Food in the Last Year: Never true    Ran Out of Food in the Last Year: Never true  Transportation Needs: Not on file (01/06/2023)  Physical Activity: Not on file  Stress: Not on file  Social Connections: Low Risk  (02/17/2022)   [facility]   Social Connections    How often do you feel that you lack companionship?: Not on file    How often do you feel left out?: Not on file    How often to you feel isolated from others?: Not on file  Intimate Partner Violence: Not At Risk (06/20/2022)   Humiliation, Afraid, Rape, and Kick questionnaire    Fear of Current or Ex-Partner: No    Emotionally Abused: No    Physically Abused: No    Sexually Abused: No    FAMILY HISTORY: Family History  Problem Relation Age of Onset   Stroke Mother    Heart disease Father 50   Hemochromatosis Brother    Breast cancer Neg Hx     ALLERGIES:  has no known allergies.  MEDICATIONS:  Current Outpatient Medications  Medication Sig Dispense Refill   acetaminophen  (TYLENOL ) 325 MG tablet Take 650 mg by mouth every 6 (six) hours as needed for mild pain or headache.     apixaban  (ELIQUIS ) 5 MG TABS tablet TAKE 1 TABLET BY MOUTH TWICE DAILY 60 tablet 5   atorvastatin  (LIPITOR) 10 MG tablet Take 10 mg by mouth daily.     cyanocobalamin (VITAMIN B12) 500 MCG tablet Take 500 mcg by mouth daily.     flecainide  (TAMBOCOR ) 100 MG tablet TAKE 1 TABLET(100 MG) BY MOUTH TWICE DAILY 180 tablet 3   folic acid  (FOLVITE ) 1 MG tablet Take 1 mg by mouth daily.     HYDROcodone -acetaminophen  (NORCO/VICODIN) 5-325 MG tablet Take 1 tablet by mouth every 6 (six) hours as needed. 6 tablet 0   levothyroxine  (SYNTHROID , LEVOTHROID) 112 MCG tablet Take 112 mcg by mouth daily.     methocarbamol  (ROBAXIN ) 500 MG tablet Take 1 tablet (500 mg total) by mouth  daily as needed for muscle spasms. 30 tablet 0   metoprolol  succinate (TOPROL -XL) 25 MG 24 hr tablet TAKE 1 TABLET BY MOUTH  DAILY (Patient taking differently: Take 25 mg by mouth daily.) 90 tablet 2   mycophenolate  (MYFORTIC ) 360 MG TBEC EC tablet Take 1 tablet (360 mg total) by mouth 2 (two) times daily. 180 tablet 3   ondansetron  (ZOFRAN ) 4 MG tablet Take 1 tablet (4 mg total) by mouth every 8 (eight) hours as needed for nausea or vomiting. 90 tablet 3   pantoprazole  (PROTONIX )  20 MG tablet Take 20 mg by mouth 2 (two) times daily.     predniSONE  (DELTASONE ) 5 MG tablet Take 1 tablet (5 mg total) by mouth daily with breakfast. 90 tablet 1   sertraline (ZOLOFT) 25 MG tablet Take 25 mg by mouth daily.     SPIRIVA  RESPIMAT 2.5 MCG/ACT AERS INHALE 2 PUFFS INTO THE LUNGS DAILY 4 g 11   sulfamethoxazole -trimethoprim  (BACTRIM  DS) 800-160 MG tablet Take 1 tablet by mouth 3 (three) times a week. TAKE 1 TABLET BY MOUTH ON MONDAY, WEDNESDAY, FRIDAY 90 tablet 3   traMADol  (ULTRAM ) 50 MG tablet Take 1 tablet (50 mg total) by mouth every 12 (twelve) hours as needed. 30 tablet 1   Vitamin D , Cholecalciferol , 25 MCG (1000 UT) TABS Take 1 tablet by mouth daily.     No current facility-administered medications for this visit.    REVIEW OF SYSTEMS:   Constitutional: ( - ) fevers, ( - )  chills , ( - ) night sweats Eyes: ( - ) blurriness of vision, ( - ) double vision, ( - ) watery eyes Ears, nose, mouth, throat, and face: ( - ) mucositis, ( - ) sore throat Respiratory: ( - ) cough, ( - ) dyspnea, ( - ) wheezes Cardiovascular: ( - ) palpitation, ( - ) chest discomfort, ( - ) lower extremity swelling Gastrointestinal:  ( - ) nausea, ( - ) heartburn, ( - ) change in bowel habits Skin: ( - ) abnormal skin rashes Lymphatics: ( - ) new lymphadenopathy, ( - ) easy bruising Neurological: ( - ) numbness, ( - ) tingling, ( - ) new weaknesses Behavioral/Psych: ( - ) mood change, ( - ) new changes  All other systems  were reviewed with the patient and are negative.  PHYSICAL EXAMINATION:  There were no vitals filed for this visit.  There were no vitals filed for this visit.   GENERAL: Well-appearing elderly Caucasian female, alert, no distress and comfortable SKIN: skin color, texture, turgor are normal, no rashes or significant lesions EYES: conjunctiva are pink and non-injected, sclera clear LUNGS: clear to auscultation and percussion with normal breathing effort HEART: regular rate & rhythm and no murmurs and no lower extremity edema Musculoskeletal: no cyanosis of digits and no clubbing  PSYCH: alert & oriented x 3, fluent speech NEURO: no focal motor/sensory deficits  LABORATORY DATA:  I have reviewed the data as listed    Latest Ref Rng & Units 04/20/2023    3:01 PM 01/11/2023    9:38 AM 11/24/2022   10:52 AM  CBC  WBC 4.0 - 10.5 K/uL 6.9  8.0  6.1   Hemoglobin 12.0 - 15.0 g/dL 16.1  09.6  04.5   Hematocrit 36.0 - 46.0 % 37.5  37.4  38.6   Platelets 150 - 400 K/uL 193  202  220.0        Latest Ref Rng & Units 04/20/2023    3:01 PM 01/11/2023    9:38 AM 10/21/2022   11:49 AM  CMP  Glucose 70 - 99 mg/dL 409  73    BUN 8 - 23 mg/dL 13  16    Creatinine 8.11 - 1.00 mg/dL 9.14  7.82    Sodium 956 - 145 mmol/L 139  140    Potassium 3.5 - 5.1 mmol/L 4.2  4.1    Chloride 98 - 111 mmol/L 103  106    CO2 22 - 32 mmol/L 30  28    Calcium  8.9 -  10.3 mg/dL 9.5  9.2    Total Protein 6.5 - 8.1 g/dL 6.6  6.3  6.8   Total Bilirubin 0.3 - 1.2 mg/dL 0.9  0.5    Alkaline Phos 38 - 126 U/L 50  54    AST 15 - 41 U/L 19  17    ALT 0 - 44 U/L 18  16     . RADIOGRAPHIC STUDIES: CT Chest High Resolution Result Date: 10/18/2023 CLINICAL DATA:  Diffuse/interstitial lung disease. EXAM: CT CHEST WITHOUT CONTRAST TECHNIQUE: Multidetector CT imaging of the chest was performed following the standard protocol without intravenous contrast. High resolution imaging of the lungs, as well as inspiratory and  expiratory imaging, was performed. RADIATION DOSE REDUCTION: This exam was performed according to the departmental dose-optimization program which includes automated exposure control, adjustment of the mA and/or kV according to patient size and/or use of iterative reconstruction technique. COMPARISON:  06/20/2022 and 08/24/2021. FINDINGS: Cardiovascular: Atherosclerotic calcification of the aorta, aortic valve and coronary arteries. Enlarged pulmonic trunk and heart. No pericardial effusion. Mediastinum/Nodes: No pathologically enlarged mediastinal or axillary lymph nodes. Hilar regions are difficult to definitively evaluate without IV contrast. Esophagus is grossly unremarkable. Lungs/Pleura: Centrilobular and paraseptal emphysema. Mild basilar predominant subpleural reticular densities and ground-glass, similar to 06/20/2022. There may be minimal associated bronchiolectasis. No honeycombing. Posterior left lower lobe nodule measures 5 mm (4 x 6 mm, 7/81), unchanged from 06/20/2022. Per Fleischner Society guidelines, no follow-up is necessary. No new pulmonary nodules. No pleural fluid. Airway is unremarkable. Minimal air trapping. Upper Abdomen: Small hiatal hernia. Visualized portions of the liver, gallbladder, adrenal glands, kidneys, spleen, pancreas, stomach and bowel are otherwise grossly unremarkable. No upper abdominal adenopathy. Musculoskeletal: Upper lumbar vertebral body augmentations. Osteopenia. Degenerative changes in the spine. Age indeterminate mild T10 compression fracture. IMPRESSION: 1. Mild basilar predominant subpleural reticular densities and ground-glass with questionable bronchiolectasis, similar to 06/20/2022. Findings are categorized as probable UIP per consensus guidelines: Diagnosis of Idiopathic Pulmonary Fibrosis: An Official ATS/ERS/JRS/ALAT Clinical Practice Guideline. Am Annie Barton Crit Care Med Vol 198, Iss 5, [phone], Feb 25 2017. Findings are indeterminate for UIP per consensus  guidelines: Diagnosis of Idiopathic Pulmonary Fibrosis: An Official ATS/ERS/JRS/ALAT Clinical Practice Guideline. Am Annie Barton Crit Care Med Vol 198, Iss 5, [phone], Feb 25 2017. 2. Minimal air trapping is indicative of small airways disease. 3. Age indeterminate mild T10 compression fracture. 4. Aortic atherosclerosis (ICD10-I70.0). Coronary artery calcification. 5. Enlarged pulmonic trunk, indicative of pulmonary arterial hypertension. 6.  Emphysema (ICD10-J43.9). Electronically Signed   By: Shearon Denis M.D.   On: 10/18/2023 15:57    ASSESSMENT & PLAN [name] 83 y.o. female with medical history significant for iron deficiency anemia in setting of hereditary hemochromatosis who presents for a follow up visit.  After review of the labs, review of the records, and discussion with the patient the patients findings are most consistent with iron deficiency anemia complicated by hereditary hemochromatosis.   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  She has not undergone phlebotomy since 2012. -- full nutritional work-up show low iron levels.  -- Due to anemia in the setting of hemochromatosis we will need to replete her with PO therapy gently. Hgb has normalized, currently holding PO iron --labs today show white blood cell 6.9, Hgb 13.0, MCV 104.7, Plt 193. Ferritin and iron studies are pending. Last ferritin 01/11/2023 at 16.  --  RTC in 6 months time, can extend to yearly at next visit if everything is stable.  No orders of the defined types were placed in this encounter.   All questions were answered. The patient knows to call the clinic with any problems, questions or concerns.  A total of more than 25 minutes were spent on this encounter with face-to-face time and non-face-to-face time, including preparing to see the patient, ordering tests and/or medications,  counseling the patient and coordination of care as outlined above.   Rogerio Clay, MD Department of Hematology/Oncology [facility] Phone: [phone] Pager: [phone] Email: [email or web address]  10/18/2023 10:29 PM

## 2023-10-19 ENCOUNTER — Inpatient Hospital Stay: Payer: Medicare Other | Attending: Hematology and Oncology

## 2023-10-19 ENCOUNTER — Inpatient Hospital Stay: Payer: Medicare Other | Admitting: Hematology and Oncology

## 2023-10-19 VITALS — BP 137/82 | HR 76 | Temp 97.3°F | Resp 13 | Wt 132.1 lb

## 2023-10-19 DIAGNOSIS — Z79899 Other long term (current) drug therapy: Secondary | ICD-10-CM | POA: Insufficient documentation

## 2023-10-19 DIAGNOSIS — Z862 Personal history of diseases of the blood and blood-forming organs and certain disorders involving the immune mechanism: Secondary | ICD-10-CM

## 2023-10-19 DIAGNOSIS — D509 Iron deficiency anemia, unspecified: Secondary | ICD-10-CM | POA: Diagnosis not present

## 2023-10-19 DIAGNOSIS — D500 Iron deficiency anemia secondary to blood loss (chronic): Secondary | ICD-10-CM

## 2023-10-19 LAB — CBC WITH DIFFERENTIAL (CANCER CENTER ONLY)
Abs Immature Granulocytes: 0.02 10*3/uL (ref 0.00–0.07)
Basophils Absolute: 0 10*3/uL (ref 0.0–0.1)
Basophils Relative: 1 %
Eosinophils Absolute: 0.1 10*3/uL (ref 0.0–0.5)
Eosinophils Relative: 1 %
HCT: 39.7 % (ref 36.0–46.0)
Hemoglobin: 13.1 g/dL (ref 12.0–15.0)
Immature Granulocytes: 0 %
Lymphocytes Relative: 14 %
Lymphs Abs: 0.9 10*3/uL (ref 0.7–4.0)
MCH: 34.5 pg — ABNORMAL HIGH (ref 26.0–34.0)
MCHC: 33 g/dL (ref 30.0–36.0)
MCV: 104.5 fL — ABNORMAL HIGH (ref 80.0–100.0)
Monocytes Absolute: 0.5 10*3/uL (ref 0.1–1.0)
Monocytes Relative: 7 %
Neutro Abs: 5.2 10*3/uL (ref 1.7–7.7)
Neutrophils Relative %: 77 %
Platelet Count: 216 10*3/uL (ref 150–400)
RBC: 3.8 MIL/uL — ABNORMAL LOW (ref 3.87–5.11)
RDW: 12.8 % (ref 11.5–15.5)
WBC Count: 6.7 10*3/uL (ref 4.0–10.5)
nRBC: 0 % (ref 0.0–0.2)

## 2023-10-19 LAB — CMP (CANCER CENTER ONLY)
ALT: 27 U/L (ref 0–44)
AST: 23 U/L (ref 15–41)
Albumin: 4.3 g/dL (ref 3.5–5.0)
Alkaline Phosphatase: 67 U/L (ref 38–126)
Anion gap: 4 — ABNORMAL LOW (ref 5–15)
BUN: 15 mg/dL (ref 8–23)
CO2: 33 mmol/L — ABNORMAL HIGH (ref 22–32)
Calcium: 9.3 mg/dL (ref 8.9–10.3)
Chloride: 101 mmol/L (ref 98–111)
Creatinine: 0.79 mg/dL (ref 0.44–1.00)
GFR, Estimated: >60 mL/min (ref >60)
Glucose, Bld: 88 mg/dL (ref 70–99)
Potassium: 4.6 mmol/L (ref 3.5–5.1)
Sodium: 138 mmol/L (ref 135–145)
Total Bilirubin: 0.7 mg/dL (ref 0.0–1.2)
Total Protein: 6.7 g/dL (ref 6.5–8.1)

## 2023-10-19 LAB — IRON AND IRON BINDING CAPACITY (CC-WL,HP ONLY)
Iron: 150 ug/dL (ref 28–170)
Saturation Ratios: 55 % — ABNORMAL HIGH (ref 10.4–31.8)
TIBC: 273 ug/dL (ref 250–450)
UIBC: 123 ug/dL — ABNORMAL LOW (ref 148–442)

## 2023-10-19 LAB — RETIC PANEL
Immature Retic Fract: 13.1 % (ref 2.3–15.9)
RBC.: 3.84 MIL/uL — ABNORMAL LOW (ref 3.87–5.11)
Retic Count, Absolute: 58.8 10*3/uL (ref 19.0–186.0)
Retic Ct Pct: 1.5 % (ref 0.4–3.1)
Reticulocyte Hemoglobin: 41 pg (ref >27.9)

## 2023-10-19 LAB — FERRITIN: Ferritin: 38 ng/mL (ref 11–307)

## 2023-10-23 ENCOUNTER — Ambulatory Visit: Payer: Medicare Other | Admitting: Physician Assistant

## 2023-10-23 DIAGNOSIS — Z87891 Personal history of nicotine dependence: Secondary | ICD-10-CM

## 2023-10-23 DIAGNOSIS — R918 Other nonspecific abnormal finding of lung field: Secondary | ICD-10-CM

## 2023-10-23 DIAGNOSIS — S32008S Other fracture of unspecified lumbar vertebra, sequela: Secondary | ICD-10-CM

## 2023-10-23 DIAGNOSIS — M47816 Spondylosis without myelopathy or radiculopathy, lumbar region: Secondary | ICD-10-CM

## 2023-10-23 DIAGNOSIS — Z5181 Encounter for therapeutic drug level monitoring: Secondary | ICD-10-CM

## 2023-10-23 DIAGNOSIS — M349 Systemic sclerosis, unspecified: Secondary | ICD-10-CM

## 2023-10-23 DIAGNOSIS — J439 Emphysema, unspecified: Secondary | ICD-10-CM

## 2023-10-23 DIAGNOSIS — M19041 Primary osteoarthritis, right hand: Secondary | ICD-10-CM

## 2023-10-23 DIAGNOSIS — Z79899 Other long term (current) drug therapy: Secondary | ICD-10-CM

## 2023-10-23 DIAGNOSIS — Z8639 Personal history of other endocrine, nutritional and metabolic disease: Secondary | ICD-10-CM

## 2023-10-23 DIAGNOSIS — E559 Vitamin D deficiency, unspecified: Secondary | ICD-10-CM

## 2023-10-23 DIAGNOSIS — Z8679 Personal history of other diseases of the circulatory system: Secondary | ICD-10-CM

## 2023-10-23 DIAGNOSIS — Z7952 Long term (current) use of systemic steroids: Secondary | ICD-10-CM

## 2023-10-23 DIAGNOSIS — M7061 Trochanteric bursitis, right hip: Secondary | ICD-10-CM

## 2023-10-23 DIAGNOSIS — M810 Age-related osteoporosis without current pathological fracture: Secondary | ICD-10-CM

## 2023-10-23 DIAGNOSIS — R790 Abnormal level of blood mineral: Secondary | ICD-10-CM

## 2023-10-23 DIAGNOSIS — J849 Interstitial pulmonary disease, unspecified: Secondary | ICD-10-CM

## 2023-10-23 DIAGNOSIS — R760 Raised antibody titer: Secondary | ICD-10-CM

## 2023-11-05 NOTE — Progress Notes (Signed)
 Office Visit Note  Patient: [name]             Date of Birth: Jan 25, 1941           MRN: [id number]             PCP: Perley Bradley, MD Referring: Perley Bradley, MD Visit Date: 11/16/2023 Occupation: @GUAROCC @  Subjective:  Routine follow up   History of Present Illness: [name] is a 83 y.o. female with history of scleroderma and ILD.  She remains on Myfortic  360 mg 1 tablet by mouth twice daily, Bactrim  DS on Mondays, Wednesdays, and Fridays and prednisone  5 mg 1 tablet by mouth daily by Dr. Bertrum Brodie.  She is tolerating combination therapy without any side effects.  Patient reports that in early spring 2025 she tried discontinuing prednisone  for 3 weeks but developed a worsening cough and increased joint pain.  Patient states that she has resumed prednisone  as prescribed.  Patient denies any new or worsening symptoms on the current treatment regimen.  She has not noticed any joint inflammation.  She is intermittent some symptoms of [name] phenomenon but denies any digital ulcerations.  She denies any skin tightness or thickening.  She denies any reflux or dysphagia.  Her blood pressure has been well-controlled.  Overall her cough has been well-controlled on prednisone .  She denies any increased breathlessness or shortness of breath.  She denies any recurrent infections. She denies any new medical conditions.     Activities of Daily Living:  Patient reports morning stiffness for 5 minutes.   Patient Reports nocturnal pain.  Difficulty dressing/grooming: Denies Difficulty climbing stairs: Denies Difficulty getting out of chair: Denies Difficulty using hands for taps, buttons, cutlery, and/or writing: Denies  Review of Systems  Constitutional:  Negative for fatigue.  HENT:  Positive for mouth dryness. Negative for mouth sores.   Eyes:  Negative for dryness.  Respiratory:  Negative for shortness of breath.   Cardiovascular:  Positive for palpitations. Negative for  chest pain.  Gastrointestinal:  Positive for constipation and diarrhea. Negative for blood in stool.  Endocrine: Negative for increased urination.  Genitourinary:  Negative for involuntary urination.  Musculoskeletal:  Positive for joint pain, joint pain, myalgias, morning stiffness and myalgias. Negative for gait problem, joint swelling, muscle weakness and muscle tenderness.  Skin:  Positive for rash, hair loss and sensitivity to sunlight. Negative for color change.  Allergic/Immunologic: Negative for susceptible to infections.  Neurological:  Negative for dizziness and headaches.  Hematological:  Negative for swollen glands.  Psychiatric/Behavioral:  Positive for sleep disturbance. Negative for depressed mood. The patient is not nervous/anxious.     PMFS History:  Patient Active Problem List   Diagnosis Date Noted   Malnutrition of moderate degree 06/25/2022   Closed compression fracture of L2 lumbar vertebra, initial encounter (HCC) 06/21/2022   Intractable back pain 06/20/2022   High risk medication use 02/18/2019   Therapeutic drug monitoring 02/18/2019   Healthcare maintenance 02/18/2019   ILD (interstitial lung disease) (HCC) 11/28/2016   Multiple lung nodules on CT 11/28/2016   Abnormal laboratory test 11/18/2016   DDD L spine 11/18/2016   Scl-70 antibody positive 11/10/2016   History of hypothyroidism 11/10/2016   History of hemochromatosis 11/10/2016   Pulmonary emphysema (HCC) 08/31/2016   [name] phenomenon without gangrene 08/31/2016   Sinusitis, chronic 08/31/2016   Chronic cough 02/10/2016   Irritable larynx 02/10/2016   Stopped smoking with greater than 40 pack year history 02/10/2016   Chest  pain, unspecified 10/06/2010   Shortness of breath 10/06/2010   Hypothyroidism    Hereditary hemochromatosis (HCC) 04/22/2010   ATRIAL FIBRILLATION 10/20/2008   PAC 10/20/2008   SYNCOPE AND COLLAPSE 10/20/2008    Past Medical History:  Diagnosis Date   Atrial  fibrillation (HCC)    a. Flecainide  therapy;  b. event monitor 4/12   Chest pain    a. GXT myoview  4/12: no isch., EF 86%;   b. 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 Isidor Marek   History of syncope    Hypothyroidism    Osteoporosis     Family History  Problem Relation Age of Onset   Stroke Mother    Heart disease Father 81   Hemochromatosis Brother    Breast cancer Neg Hx    Past Surgical History:  Procedure Laterality Date   IR KYPHO LUMBAR INC FX REDUCE BONE BX UNI/BIL CANNULATION INC/IMAGING  06/30/2022   IR KYPHO LUMBAR INC FX REDUCE BONE BX UNI/BIL CANNULATION INC/IMAGING  08/16/2022   MOUTH SURGERY     SHOULDER ARTHROSCOPY Left    TOTAL ABDOMINAL HYSTERECTOMY     VARICOSE VEIN SURGERY     Social History   Social History Narrative   REGULAR EXERCISE   Immunization History  Administered Date(s) Administered   Fluad Quad(high Dose 65+) 02/18/2019, 04/21/2022   Fluzone Influenza virus vaccine,trivalent (IIV3), split virus 03/27/2019   Influenza, High Dose Seasonal PF 08/29/2017, 04/26/2018, 04/29/2020, 05/12/2021   PFIZER(Purple Top)SARS-COV-2 Vaccination 07/22/2019, 08/12/2019, 03/11/2020   Pneumococcal Conjugate-13 06/12/2014   Pneumococcal Polysaccharide-23 06/27/2010, 04/25/2018   Tdap 03/16/2011, 07/05/2019   Zoster Recombinant(Shingrix) 04/08/2019   Zoster, Live 06/27/2010, 04/08/2019     Objective: Vital Signs: BP 130/78 (BP Location: Left Arm, Patient Position: Sitting, Cuff Size: Normal)   Pulse 67   Resp 15   Ht 5' 3.5" (1.613 m)   Wt 133 lb 6.4 oz (60.5 kg)   BMI 23.26 kg/m    Physical Exam Vitals and nursing note reviewed.  Constitutional:      Appearance: She is well-developed.  HENT:     Head: Normocephalic and atraumatic.  Eyes:     Conjunctiva/sclera: Conjunctivae normal.  Cardiovascular:      Rate and Rhythm: Normal rate and regular rhythm.     Heart sounds: Normal heart sounds.  Pulmonary:     Effort: Pulmonary effort is normal.     Breath sounds: Normal breath sounds.  Abdominal:     General: Bowel sounds are normal.     Palpations: Abdomen is soft.  Musculoskeletal:     Cervical back: Normal range of motion.  Lymphadenopathy:     Cervical: No cervical adenopathy.  Skin:    General: Skin is warm and dry.     Capillary Refill: Capillary refill takes less than 2 seconds.  Neurological:     Mental Status: She is alert and oriented to person, place, and time.  Psychiatric:        Behavior: Behavior normal.      Musculoskeletal Exam: C-spine has limited range of motion without rotation.  Good flexion and extension of the cervical spine.  Thoracic kyphosis was noted.  Slightly limited mobility of the lumbar spine.  Shoulder joints, elbow joints, wrist joints, MCPs, PIPs, DIPs have good range of motion with no synovitis.  Complete fist formation bilaterally.  Mild CMC, PIP, DIP thickening.  Hip joints have good range of motion with no groin pain.  Knee joints have good range of motion no warmth or effusion.  Ankle joints have good range of motion with no tenderness or joint swelling.  Mild dorsal spurs noted bilaterally.  Tailor bunions noted bilaterally.  No tenderness or synovitis over MTP joints.  CDAI Exam: CDAI Score: -- Patient Global: --; Provider Global: -- Swollen: --; Tender: -- Joint Exam 11/16/2023   No joint exam has been documented for this visit   There is currently no information documented on the homunculus. Go to the Rheumatology activity and complete the homunculus joint exam.  Investigation: No additional findings.  Imaging: CT HEAD WO CONTRAST ( ) Result Date: 11/07/2023 CLINICAL DATA:  Provided history: Concussion with brief loss of consciousness. Additional history provided: Recent fall (with head trauma). Headaches. Posterior head pain. EXAM: CT  HEAD WITHOUT CONTRAST TECHNIQUE: Contiguous axial images were obtained from the base of the skull through the vertex without intravenous contrast. RADIATION DOSE REDUCTION: This exam was performed according to the departmental dose-optimization program which includes automated exposure control, adjustment of the mA and/or kV according to patient size and/or use of iterative reconstruction technique. COMPARISON:  Head CT 06/25/2023. FINDINGS: Brain: Generalized cerebral atrophy. Patchy and ill-defined hypoattenuation within the cerebral white matter, nonspecific but compatible with mild chronic small vessel ischemic disease. There is no acute intracranial hemorrhage. No demarcated cortical infarct. No extra-axial fluid collection. No evidence of an intracranial mass. No midline shift. Vascular: No hyperdense vessel.  Atherosclerotic calcifications. Skull: No calvarial fracture or aggressive osseous lesion. Sinuses/Orbits: No mass or acute finding within the imaged orbits. No significant paranasal sinus disease at the imaged levels. IMPRESSION: 1.  No evidence of an acute intracranial abnormality. 2. Parenchymal atrophy and chronic small vessel ischemic disease. Electronically Signed   By: Bascom Lily D.O.   On: 11/07/2023 13:54    Recent Labs: Lab Results  Component Value Date   WBC 6.7 10/19/2023   HGB 13.1 10/19/2023   PLT 216 10/19/2023   NA 138 10/19/2023   K 4.6 10/19/2023   CL 101 10/19/2023   CO2 33 (H) 10/19/2023   GLUCOSE 88 10/19/2023   BUN 15 10/19/2023   CREATININE 0.79 10/19/2023   BILITOT 0.7 10/19/2023   ALKPHOS 67 10/19/2023   AST 23 10/19/2023   ALT 27 10/19/2023   PROT 6.7 10/19/2023   ALBUMIN 4.3 10/19/2023   CALCIUM  9.3 10/19/2023   GFRAA 101 06/09/2020   QFTBGOLDPLUS NEGATIVE 08/10/2017    Speciality Comments: Reclast  IV August 23, 2022  Procedures:  No procedures performed Allergies: Patient has no known allergies.    Assessment / Plan:     Visit Diagnoses:  Scleroderma (HCC) -HX of nailbed capillary changes, anticardiolipin antibody+, Scl-70+, ILD: No new or worsening symptoms.  She is clinically stable taking Myfortic  360 mg 1 tablet by mouth twice daily, Bactrim  DS on Mondays, Wednesdays, and Fridays and prednisone  5 mg 1 tablet by mouth daily.  No recent or recurrent infections. No signs of sclerodactyly noted.  She has intermittent symptoms of [name] phenomenon but no digital ulcerations were noted.  She has no synovitis on examination today. Discussed the importance of close blood pressure monitoring due to the increased risk for renal crisis in patients with scleroderma. She is not experiencing any symptoms of dysphagia or reflux. No new or worsening pulmonary symptoms.  No progression noted on high-resolution chest CT from 10/04/2023.  PFTs were  updated on 07/06/2023.  She is scheduled to follow-up with Dr. Bertrum Brodie today. She was advised to notify us  if she develops any new or worsening symptoms.  She will follow-up in the office in 5 months or sooner if needed.  Anticardiolipin antibody positive - Low titer positive anticardiolipin antibodies.  10/21/2022: Anticardiolipin IgA was positive, beta-2  GP 1 IgA was positive.   She remains on Eliquis  as prescribed. She remains under the care of hematology.   ILD (interstitial lung disease) (HCC) - Under the care of Dr. Bertrum Brodie. No new or worsening pulmonary symptoms.   High resolution chest CT 10/04/23:  Mild basilar predominant subpleural reticular densities and ground-glass with questionable bronchiolectasis, similar to 06/20/2022. PFTs updated on 07/06/23.  Patient remains on Myfortic  360 mg 1 tablet by mouth twice daily, Bactrim  DS on Mondays, Wednesdays, and Fridays and prednisone  5 mg 1 tablet by mouth daily.  She tried discontinuing prednisone  for 3 weeks in March 2025 but had a more frequent and severe cough.  She resumed prednisone  5 mg daily and the cough has improved.  Patient is scheduled to  follow-up with Dr. Bertrum Brodie today.   High risk medication use - Myfortic  360 mg 1 tablet by mouth twice daily, Bactrim  DS on Mondays, Wednesdays, and Fridays and prednisone  5 mg 1 tablet by mouth daily by Dr. Bertrum Brodie. CBC and CMP updated on 10/19/23-reviewed results with the patient today in the office.  Patient plans on continuing to have updated lab work every 3 months either with her hematologist or PCP. No recent or recurrent infections.  Long term systemic steroid user -Prednisone  5 mg 1 tablet by mouth daily for ILD-prescribed by Dr. Bertrum Brodie.  She tried discontinuing prednisone  for 3 weeks in March 2025 under the direction of Dr. Bertrum Brodie but she had worsening of her daily cough and had to resume prednisone  5 mg daily.  She is aware of the risks of long-term prednisone  use.  Primary osteoarthritis of both hands: No tenderness or inflammation noted on examination today.  She has complete fist formation bilaterally.   Trochanteric bursitis of both hips: Not currently symptomatic.  Spondylosis of lumbar spine: She has intermittent discomfort in her lower back.  No symptoms of radiculopathy.  No midline spinal tenderness.  She takes methocarbamol  sparingly if she is experiencing increased muscle tension or spasms.  She requested a refill of methocarbamol  to be sent to the pharmacy today.  Age-related osteoporosis without current pathological fracture - 05/02/2022 T-score -2.9, BMD 0.528 LFN, no comparison. DEXA  11/04/2019 T score -2.9 LFN,BMD 0.532.  She receives IV Reclast  under the care of Dr. Annabell Key.  IV Reclast  administered at end of February.  DEXA due in November 2025.    Medication monitoring encounter - Reclast  IV infusion under care of Dr. Annabell Key.    Other closed fracture of lumbar vertebra, L1, L2 sequela - L2 compression fracture December 2023, L1 compression fracture 2024 s/p kyphoplasty x 2.  Other medical conditions are listed as follows:  Multiple lung nodules on CT -  Followed by Dr. Bertrum Brodie.  History of atrial fibrillation  History of emphysema (HCC)  Former smoker  Vitamin D  deficiency: She is taking vitamin D  1000 units daily.  History of hemochromatosis: Under the care of hematology.  History of hypothyroidism  Orders: No orders of the defined types were placed in this encounter.  Meds ordered this encounter  Medications   methocarbamol  (ROBAXIN ) 500 MG tablet    Sig: Take 1 tablet (500 mg total) by mouth  daily as needed for muscle spasms.    Dispense:  30 tablet    Refill:  0    Follow-Up Instructions: Return in about 5 months (around 04/17/2024) for Scleroderma.   Romayne Clubs, PA-C  Note - This record has been created using Dragon software.  Chart creation errors have been sought, but may not always  have been located. Such creation errors do not reflect on  the standard of medical care.

## 2023-11-07 ENCOUNTER — Ambulatory Visit
Admission: RE | Admit: 2023-11-07 | Discharge: 2023-11-07 | Disposition: A | Discharge status: Home / Self Care | Source: Ambulatory Visit | Attending: Family Medicine | Admitting: Family Medicine

## 2023-11-07 ENCOUNTER — Other Ambulatory Visit: Payer: Self-pay | Admitting: Family Medicine

## 2023-11-07 DIAGNOSIS — S060X1A Concussion with loss of consciousness of 30 minutes or less, initial encounter: Secondary | ICD-10-CM

## 2023-11-13 ENCOUNTER — Telehealth: Payer: Self-pay

## 2023-11-13 ENCOUNTER — Ambulatory Visit: Admitting: Gastroenterology

## 2023-11-13 ENCOUNTER — Encounter: Payer: Self-pay | Admitting: Gastroenterology

## 2023-11-13 VITALS — BP 112/62 | HR 88 | Ht 63.5 in | Wt 134.0 lb

## 2023-11-13 DIAGNOSIS — I480 Paroxysmal atrial fibrillation: Secondary | ICD-10-CM

## 2023-11-13 DIAGNOSIS — R197 Diarrhea, unspecified: Secondary | ICD-10-CM

## 2023-11-13 DIAGNOSIS — Z7901 Long term (current) use of anticoagulants: Secondary | ICD-10-CM

## 2023-11-13 DIAGNOSIS — R159 Full incontinence of feces: Secondary | ICD-10-CM

## 2023-11-13 DIAGNOSIS — K219 Gastro-esophageal reflux disease without esophagitis: Secondary | ICD-10-CM | POA: Diagnosis not present

## 2023-11-13 DIAGNOSIS — D509 Iron deficiency anemia, unspecified: Secondary | ICD-10-CM

## 2023-11-13 DIAGNOSIS — R195 Other fecal abnormalities: Secondary | ICD-10-CM | POA: Diagnosis not present

## 2023-11-13 DIAGNOSIS — J849 Interstitial pulmonary disease, unspecified: Secondary | ICD-10-CM

## 2023-11-13 MED ORDER — NA SULFATE-K SULFATE-MG SULF 17.5-3.13-1.6 GM/177ML PO SOLN: 1.0000 | ORAL | 0 refills | Status: DC

## 2023-11-13 NOTE — Telephone Encounter (Signed)
  [name] April 17, 1941 [id number]  11-13-23   Dear Dr Bertrum Brodie,  We have scheduled the above named patient for an endoscopy and colonoscopy procedure. Our records show that she is being followed by you for pulmonary issues.  Please advise as to whether the patient is cleared from a pulmonary standpoint prior to her procedure which is scheduled for 12-22-23.  Please route your response to Angelene Barbone, CMA or fax response to [phone].  Sincerely,    [facility]

## 2023-11-13 NOTE — Progress Notes (Signed)
 Chief Complaint: Diarrhea, change in bowel habits   Referring Provider:     Perley Bradley, MD   HPI:     [name] is a 83 y.o. female with a history of A-fib (on Eliquis ), anxiety, depression, ILD, hereditary hemochromatosis, [name], hypothyroidism, osteoporosis, GERD, referred to the [facility]astroenterology Clinic for evaluation of diarrhea and change in bowel habits.  Has been having watery, nonbloody stools for the last year.  Symptoms have actually been present for the last few years, but worsening over the last year.  Describes watery stools with urgency and episodic urge incontinence and fecal seepage. + Nocturnal stools with abdominal cramping.  Can have 1-2 days of constipation then back to the watery diarrhea. No related to diet/PO intake. No abdominal pain.  Occasional nausea, but no emesis.  Symptoms improved with OTC Imodium on demand.  Previously followed with Dr. Andriette Keeling, last seen 07/14/2021 for evaluation of changes in bowel habits with episodic diarrhea.  Was started on Levbid nightly and a Levsin on demand (no appreciable change per patient).  Stopped omeprazole and changed to pantoprazole .  Labs unremarkable as below. - 06/2021: GI PCR panel negative, FOBT negative, O&P negative, C. difficile negative, normal lactoferrin, normal calprotectin, negative TTG   Additionally, longstanding history of GERD with index symptoms of heartburn and chronic cough.  No dysphagia.  Worse with tomato-based sauces, wine.  Previously treated with omeprazole 20 mg bid, and was changed to pantoprazole  40 mg daily in 2023 and now well controlled with rare breakthrough. Occasional nausea, well controlled with Zofran  on demand.   Reviewed most recent labs from 09/2023: - MCV 104 (chronic), otherwise normal CBC - CO2 33, otherwise normal CMP - Iron sat 55% with remainder of iron panel normal  She follows with Dr. Rosaline Coma in the Hematology Clinic for hereditary hemochromatosis with  associated iron deficiency anemia, although no phlebotomy since 2012.  Follows with Dr. Bertrum Brodie in the Pulmonary Clinic for emphysema and ILD secondary to autoimmune disease clinically suspicious for scleroderma. On chronic MMF, low dose Prednisone , and Bactrim  prophylaxis.  Follows in the Cardiology Clinic for A-fib, last seen 03/2023.  No recent abdominal imaging for review.  Last colonoscopy was many years ago.  No previous endoscopy reports available for review.   Past Medical History:  Diagnosis Date   Atrial fibrillation (HCC)    a. Flecainide  therapy;  b. event monitor 4/12   Chest pain    a. GXT myoview  4/12: no isch., EF 86%;   b. 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 Isidor Marek   History of syncope    Hypothyroidism    Osteoporosis      Past Surgical History:  Procedure Laterality Date   IR KYPHO LUMBAR INC FX REDUCE BONE BX UNI/BIL CANNULATION INC/IMAGING  06/30/2022   IR KYPHO LUMBAR INC FX REDUCE BONE BX UNI/BIL CANNULATION INC/IMAGING  08/16/2022   MOUTH SURGERY     SHOULDER ARTHROSCOPY Left    TOTAL ABDOMINAL HYSTERECTOMY     VARICOSE VEIN SURGERY     Family History  Problem Relation Age of Onset   Stroke Mother    Heart disease Father 75   Hemochromatosis Brother    Breast cancer Neg Hx    Social History   Tobacco Use  Smoking status: Former    Current packs/day: 0.00    Average packs/day: 1 pack/day for 42.0 years (42.0 ttl pk-yrs)    Types: Cigarettes    Start date: 06/28/1955    Quit date: 06/27/1997    Years since quitting: 26.3    Passive exposure: Never   Smokeless tobacco: Never  Vaping Use   Vaping status: Never Used  Substance Use Topics   Alcohol  use: Yes    Comment: occ   Drug use: No   Current Outpatient Medications  Medication Sig Dispense Refill   acetaminophen  (TYLENOL ) 325 MG tablet  Take 650 mg by mouth every 6 (six) hours as needed for mild pain or headache.     apixaban  (ELIQUIS ) 5 MG TABS tablet TAKE 1 TABLET BY MOUTH TWICE DAILY 60 tablet 5   atorvastatin  (LIPITOR) 10 MG tablet Take 10 mg by mouth daily.     cyanocobalamin (VITAMIN B12) 500 MCG tablet Take 500 mcg by mouth daily.     flecainide  (TAMBOCOR ) 100 MG tablet TAKE 1 TABLET(100 MG) BY MOUTH TWICE DAILY 180 tablet 3   folic acid  (FOLVITE ) 1 MG tablet Take 1 mg by mouth daily.     levothyroxine  (SYNTHROID , LEVOTHROID) 112 MCG tablet Take 112 mcg by mouth daily.     methocarbamol  (ROBAXIN ) 500 MG tablet Take 1 tablet (500 mg total) by mouth daily as needed for muscle spasms. 30 tablet 0   metoprolol  succinate (TOPROL -XL) 25 MG 24 hr tablet TAKE 1 TABLET BY MOUTH  DAILY (Patient taking differently: Take 25 mg by mouth daily.) 90 tablet 2   mycophenolate  (MYFORTIC ) 360 MG TBEC EC tablet Take 1 tablet (360 mg total) by mouth 2 (two) times daily. 180 tablet 3   ondansetron  (ZOFRAN ) 4 MG tablet Take 1 tablet (4 mg total) by mouth every 8 (eight) hours as needed for nausea or vomiting. 90 tablet 3   pantoprazole  (PROTONIX ) 20 MG tablet Take 20 mg by mouth 2 (two) times daily.     predniSONE  (DELTASONE ) 5 MG tablet Take 1 tablet (5 mg total) by mouth daily with breakfast. 90 tablet 1   sertraline (ZOLOFT) 25 MG tablet Take 25 mg by mouth daily.     SPIRIVA  RESPIMAT 2.5 MCG/ACT AERS INHALE 2 PUFFS INTO THE LUNGS DAILY 4 g 11   sulfamethoxazole -trimethoprim  (BACTRIM  DS) 800-160 MG tablet Take 1 tablet by mouth 3 (three) times a week. TAKE 1 TABLET BY MOUTH ON MONDAY, WEDNESDAY, FRIDAY 90 tablet 3   traMADol  (ULTRAM ) 50 MG tablet Take 1 tablet (50 mg total) by mouth every 12 (twelve) hours as needed. 30 tablet 1   Vitamin D , Cholecalciferol , 25 MCG (1000 UT) TABS Take 1 tablet by mouth daily.     HYDROcodone -acetaminophen  (NORCO/VICODIN) 5-325 MG tablet Take 1 tablet by mouth every 6 (six) hours as needed. (Patient not taking:  Reported on 11/13/2023) 6 tablet 0   No current facility-administered medications for this visit.   No Known Allergies   Review of Systems: All systems reviewed and negative except where noted in HPI.     Physical Exam:    Wt Readings from Last 3 Encounters:  11/13/23 134 lb (60.8 kg)  10/19/23 132 lb 1.6 oz (59.9 kg)  07/06/23 136 lb 1.6 oz (61.7 kg)    BP 112/62   Pulse 88   Ht 5' 3.5" (1.613 m)   Wt 134 lb (60.8 kg)   BMI 23.36 kg/m  Constitutional:  Pleasant, in no acute distress. Psychiatric: Normal  mood and affect. Behavior is normal. Cardiovascular: Normal rate, regular rhythm. No edema Pulmonary/chest: Effort normal and breath sounds normal. No wheezing, rales or rhonchi. Abdominal: Soft, nondistended, nontender. Bowel sounds active throughout. There are no masses palpable. No hepatomegaly. Neurological: Alert and oriented to person place and time. Skin: Skin is warm and dry. No rashes noted.   ASSESSMENT AND PLAN;   1) Diarrhea 2) Change in stools 3) Fecal seepage - EGD and colonoscopy with random and directed biopsies - If endoscopic/histologic evaluation unrevealing, may consider either SIBO testing vs trial of rifaximin (is on chronic antibiotic therapy) - Ok to use Imodium on demand - Previous infectious workup, fecal lactoferrin, fecal calprotectin, and celiac panel were otherwise negative/normal in 2023.  Not repeating today.  4) GERD - Continue pantoprazole  - Evaluate for erosive esophagitis, LES laxity, hiatal hernia time of upper endoscopy as above.  Patient reports being told she has a hiatal hernia in the past - Continue antireflux lifestyle/dietary modification  5) Iron deficiency anemia 6) Hereditary hemochromatosis - Continue follow-up in the Hematology Clinic - Will evaluate for additional GI source of IDA at time of EGD/colonoscopy as above  6) Atrial fibrillation 7) Chronic anticoagulation - Hold Eliquis  2 days before procedure - will  instruct when and how to resume after procedure. Low but real risk of cardiovascular event such as heart attack, stroke, embolism, thrombosis or ischemia/infarct of other organs off Eliquis  explained and need to seek urgent help if this occurs. The patient consents to proceed. Will communicate by phone or EMR with patient's prescribing provider to confirm that holding Eliquis  is reasonable in this case  8) Interstitial Lung Disease - Will request Pulmonary clearance to proceed with EGD/colonoscopy - Continue mycophenolate , low-dose steroid, and Bactrim  DS as prescribed  The indications, risks, and benefits of EGD and colonoscopy were explained to the patient in detail. Risks include but are not limited to bleeding, perforation, adverse reaction to medications, and cardiopulmonary compromise. Sequelae include but are not limited to the possibility of surgery, hospitalization, and mortality. The patient verbalized understanding and wished to proceed. All questions answered, referred to scheduler and bowel prep ordered. Further recommendations pending results of the exam.      Annis Kinder, DO, FACG  11/13/2023, 2:03 PM   Perley Bradley, MD

## 2023-11-13 NOTE — Patient Instructions (Signed)
 _______________________________________________________  If your blood pressure at your visit was 140/90 or greater, please contact your primary care physician to follow up on this. _______________________________________________________  If you are age 82 or older, your body mass index should be between 23-30. Your Body mass index is 23.36 kg/m. If this is out of the aforementioned range listed, please consider follow up with your Primary Care Provider.  If you are age 45 or younger, your body mass index should be between 19-25. Your Body mass index is 23.36 kg/m. If this is out of the aformentioned range listed, please consider follow up with your Primary Care Provider.  ________________________________________________________  The [facility] providers would like to encourage you to use MYCHART to communicate with providers for non-urgent requests or questions.  Due to long hold times on the telephone, sending your provider a message by [facility] may be a faster and more efficient way to get a response.  Please allow 48 business hours for a response.  Please remember that this is for non-urgent requests.  _______________________________________________________  Elene Griffes have been scheduled for an endoscopy and colonoscopy. Please follow the written instructions given to you at your visit today.  If you use inhalers (even only as needed), please bring them with you on the day of your procedure.  DO NOT TAKE 7 DAYS PRIOR TO TEST- Trulicity (dulaglutide) Ozempic, Wegovy (semaglutide) Mounjaro (tirzepatide) Bydureon Bcise (exanatide extended release)  DO NOT TAKE 1 DAY PRIOR TO YOUR TEST Rybelsus (semaglutide) Adlyxin (lixisenatide) Victoza (liraglutide) Byetta (exanatide) ___________________________________________________________________________   Due to recent changes in healthcare laws, you may see the results of your imaging and laboratory studies on MyChart before your provider has had a  chance to review them.  We understand that in some cases there may be results that are confusing or concerning to you. Not all laboratory results come back in the same time frame and the provider may be waiting for multiple results in order to interpret others.  Please give us  48 hours in order for your provider to thoroughly review all the results before contacting the office for clarification of your results.   It was a pleasure to see you today!  [name], D.O.

## 2023-11-13 NOTE — Telephone Encounter (Signed)
 [facility] Pre-operative Risk Assessment     Request for surgical clearance:     Endoscopy Procedure  What type of surgery is being performed?     EGD/Colonoscopy  When is this surgery scheduled?     12-22-23  What type of clearance is required ?   Pharmacy  Are there any medications that need to be held prior to surgery and how long? Eliquis  x 2 days  Practice name and name of physician performing surgery?      [facility]  What is your office phone and fax number?      Phone- [phone]  Fax- [phone]  Anesthesia type (None, local, MAC, general) ?       MAC   Please route your response to Angelene Barbone, CMA

## 2023-11-14 NOTE — Telephone Encounter (Signed)
  ILD has been stable . I laswt saw her in JAn 2025 ndn ILD was stable. This CT in April also says ILD stable. So if she is feeling stable 0k to do GI procedures as outpatient. Your doc will have to figure out stopping anticogulation appropriately whichi s fine with me  xxxxxxxxxxxxxxx IMPRESSION: 1. Mild basilar predominant subpleural reticular densities and ground-glass with questionable bronchiolectasis, similar to 06/20/2022. Findings are categorized as probable UIP per consensus guidelines: Diagnosis of Idiopathic Pulmonary Fibrosis: An Official ATS/ERS/JRS/ALAT Clinical Practice Guideline. Am Annie Barton Crit Care Med Vol 198, Iss 5, [phone], Feb 25 2017. Findings are indeterminate for UIP per consensus guidelines: Diagnosis of Idiopathic Pulmonary Fibrosis: An Official ATS/ERS/JRS/ALAT Clinical Practice Guideline. Am Annie Barton Crit Care Med Vol 198, Iss 5, [phone], Feb 25 2017. 2. Minimal air trapping is indicative of small airways disease. 3. Age indeterminate mild T10 compression fracture. 4. Aortic atherosclerosis (ICD10-I70.0). Coronary artery calcification. 5. Enlarged pulmonic trunk, indicative of pulmonary arterial hypertension. 6.  Emphysema (ICD10-J43.9).     Electronically Signed   By: Shearon Denis M.D.   On: 10/18/2023 15:57

## 2023-11-14 NOTE — Telephone Encounter (Signed)
 Left message on patient's voicemail to make her aware that she has been cleared from a pulmonary standpoint to proceed with endo/colon.  Advised that we will wait for Eliquis  instructions from cardiology.

## 2023-11-16 ENCOUNTER — Ambulatory Visit: Attending: Physician Assistant | Admitting: Physician Assistant

## 2023-11-16 ENCOUNTER — Encounter: Payer: Self-pay | Admitting: Physician Assistant

## 2023-11-16 ENCOUNTER — Ambulatory Visit: Admitting: Internal Medicine

## 2023-11-16 ENCOUNTER — Encounter: Payer: Self-pay | Admitting: Internal Medicine

## 2023-11-16 VITALS — BP 130/78 | HR 67 | Resp 15 | Ht 63.5 in | Wt 133.4 lb

## 2023-11-16 VITALS — BP 122/68 | HR 63 | Ht 63.5 in | Wt 133.6 lb

## 2023-11-16 DIAGNOSIS — R918 Other nonspecific abnormal finding of lung field: Secondary | ICD-10-CM

## 2023-11-16 DIAGNOSIS — M19042 Primary osteoarthritis, left hand: Secondary | ICD-10-CM

## 2023-11-16 DIAGNOSIS — R768 Other specified abnormal immunological findings in serum: Secondary | ICD-10-CM | POA: Diagnosis not present

## 2023-11-16 DIAGNOSIS — Z79899 Other long term (current) drug therapy: Secondary | ICD-10-CM | POA: Diagnosis not present

## 2023-11-16 DIAGNOSIS — Z8679 Personal history of other diseases of the circulatory system: Secondary | ICD-10-CM

## 2023-11-16 DIAGNOSIS — Z5181 Encounter for therapeutic drug level monitoring: Secondary | ICD-10-CM

## 2023-11-16 DIAGNOSIS — J849 Interstitial pulmonary disease, unspecified: Secondary | ICD-10-CM | POA: Diagnosis not present

## 2023-11-16 DIAGNOSIS — Z87891 Personal history of nicotine dependence: Secondary | ICD-10-CM

## 2023-11-16 DIAGNOSIS — M7061 Trochanteric bursitis, right hip: Secondary | ICD-10-CM

## 2023-11-16 DIAGNOSIS — S32008S Other fracture of unspecified lumbar vertebra, sequela: Secondary | ICD-10-CM

## 2023-11-16 DIAGNOSIS — Z7952 Long term (current) use of systemic steroids: Secondary | ICD-10-CM

## 2023-11-16 DIAGNOSIS — M349 Systemic sclerosis, unspecified: Secondary | ICD-10-CM

## 2023-11-16 DIAGNOSIS — R760 Raised antibody titer: Secondary | ICD-10-CM | POA: Diagnosis not present

## 2023-11-16 DIAGNOSIS — M7062 Trochanteric bursitis, left hip: Secondary | ICD-10-CM

## 2023-11-16 DIAGNOSIS — J439 Emphysema, unspecified: Secondary | ICD-10-CM

## 2023-11-16 DIAGNOSIS — M19041 Primary osteoarthritis, right hand: Secondary | ICD-10-CM

## 2023-11-16 DIAGNOSIS — M47816 Spondylosis without myelopathy or radiculopathy, lumbar region: Secondary | ICD-10-CM

## 2023-11-16 DIAGNOSIS — J432 Centrilobular emphysema: Secondary | ICD-10-CM

## 2023-11-16 DIAGNOSIS — Z8639 Personal history of other endocrine, nutritional and metabolic disease: Secondary | ICD-10-CM

## 2023-11-16 DIAGNOSIS — K521 Toxic gastroenteritis and colitis: Secondary | ICD-10-CM

## 2023-11-16 DIAGNOSIS — E559 Vitamin D deficiency, unspecified: Secondary | ICD-10-CM

## 2023-11-16 DIAGNOSIS — M810 Age-related osteoporosis without current pathological fracture: Secondary | ICD-10-CM

## 2023-11-16 MED ORDER — METHOCARBAMOL 500 MG PO TABS: 500.0000 mg | ORAL_TABLET | Freq: Every day | ORAL | 0 refills | Status: DC | PRN

## 2023-11-16 NOTE — Progress Notes (Signed)
 PCP [name],Tara C, PA-C   HPI  IOV 02/10/2016  Chief Complaint  Patient presents with   Pulmonary Consult    Pt referred by Dr. Charmel Cooter for chronic cough x 1 year. Pt states she feels she has a tickle that is causing her dry cough. Pt states she has DOE when climbing stairs. Pt deneis CP/tightness.     83 year old female with hemochromatosis homozygous gene followed by Dr. Isidor Marek (last phlebotomy many years ago and is on serial monitoring) with children and siblings with active disease. She has also atrial fibrillation followed by Dr. Rodolfo Clan. Reports insidious onset of chronic cough in the last 1 year. It is stable since onset. It fluctuates between mild and severe in severity. It is mostly dry in quality. Mostly present in the daytime but sometimes also wakes her up at night. It is definitely not progressive. It is episodic and present every day. Aggravated by talking sometimes and then the throat feels dry associated with tickle in the throat and also sensation the cough is coming from the upper chest retrosternally and sometimes relieved by drinking water or chewing on a lozenge. Also aggravated by seasonal changes particularly in the spring and the fall which makes her think she has allergies. There is sometimes associated gag. She also reports nonspecific occasional wheezing and associated shortness of breath that is nonspecific but present with exertion and relieved by rest No other clear cut aggravating or relieving factors.   cough associated history  - Medications: She is not on fish oil or ACE inhibitors - Sinus drainage: She does admit to spring allergies. She did have something removed from her hard palate several years ago by ENT does not know details - Acid reflux: She says she is significant acid reflux and is on Prilosec. Without which she'll have significant symptoms - Pulmonary disease: FenO 12 02/10/2016  and normal. She denies any personal history of asthma or  pulmonary fibrosis of COPD or emphysema smoked one pack per day started smoking at age 70 and quit in 1999. Making a 42 pack smoking history. Chest x-ray 08/26/2014 personally visualized is clear - Tobacco :  reports that she quit smoking about 18 years ago. She has a 42.00 pack-year smoking history. She has never used smokeless tobacco.       OV 02/18/2016  Chief Complaint  Patient presents with   Follow-up    Pt here after PFT and HRCT. Pt denies changes in SOB and cough. Pt denies any new complaints at this time.    Follow-up chronic cough. This visit is to follow-up on test results of function test and high resolution CT chest which are described below9 results show isolated reduction in diffusion capacity which can be explained by possible early ILD and also emphysema. There are also new findings of nodule 6 mm bilaterally on lower lobes. She prefers a very cautious approach to this evaluation.     Pulmonary function test 02/12/2016 - FVC 2.8 cm/101%, FEV1 1.9 L/91%. Ratio 60/90%. Total lung capacity 115%. DLCO reduced at 14.5/60%. She is isolated reduction in diffusion capacity    HRCT chest 02/16/16 IMPRESSION: 1. Mild patchy subpleural reticulation in both lungs with a basilar predominance. No significant traction bronchiectasis. No frank honeycombing. Findings could represent an interstitial lung disease such as nonspecific interstitial pneumonia (NSIP), with early usual interstitial pneumonia (UIP) not excluded. A follow-up high-resolution chest CT study in 12 months is recommended to assess temporal pattern stability. 2. Bilateral lower lobe  solid pulmonary nodules, largest 6 mm. Non-contrast chest CT at 3-6 months is recommended. If the nodules are stable at time of repeat CT, then future CT at 18-24 months (from today's scan) is considered optional for low-risk patients, but is recommended for high-risk patients. This recommendation follows the consensus statement:  Guidelines for Management of Incidental Pulmonary Nodules Detected on CT Images:From the Fleischner Society 2017; published online before print (10.1148/radiol.[id number]). 3. Additional findings include mild-to-moderate centrilobular emphysema, aortic atherosclerosis and 2 vessel coronary atherosclerosis.     Electronically Signed   By: Levell Reach M.D.   On: 02/16/2016 14:14    OV 08/31/2016  Chief Complaint  Patient presents with   Follow-up    HRCT was never scheduled. Pt states that the cough is still present >> slightly improved since last OV. Pt states that she feels her allergies has gotten it ramped up again.    Follow-up multifactorial cough associated with autoimmune antibody positive, interstitial lung disease and emphysema previous history of smoking  After last visit in August 2017 she had high resolution CT chest in December 2017 that showed persistence of ILD. I personally visualized the CT chest. He comes for follow-up. She tells me that since August 2017 she has insidious onset of shortness of breath and is progressively worse. It is mild and present only on inclines is not therefore tenderness. In terms of her cough is persistent. It is associated with significant sinus drainage that she thinks is allergy related. She constantly clears her throat. Lab review shows autoimmune antibody positive in August 2017 with scleroderma antibody slightly elevated at 6.4. I referred her to rheumatology but she does not remember this and she did not make this follow-up. She also tells me that she definitely has a long history of [name] phenomena in her fingers but no one has ever formally diagnosed with connective tissu  e disease.  OV 11/28/2016  Chief Complaint  Patient presents with   Follow-up    Pt here after PFT. Pt states her breathing is uchanged since last OV. Pt c/o dry cough.- pt states this has slightly improved since last OV. Pt denies CP/tightness and f/c/s.      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 Since then she has seen rheumatologis Dr. Baker Bon. I reviewd the notes and also discussed with the patient wa a good understading of what s going on. She tells me that some of the lupus antibodies have been positive correlating with Raynaud. Patient is on beta blocker for atrial fbrillation and it is the preference by the rheumatologist Dr. D that she switc to calcium  channel blocker. She has a follow-up appointment pending with her electrophysiologist Dr. Rodolfo Clan.  She is scleroderma antibody positive but according to her rheumtolgist she does not have dermatologic  Manifestation of the disese. 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 She pulm  function test today and this is stable. In terms of arrest or symptoms she stable. The cough is actually improved. But she does get dyspneic walking up stairs especially a few flights. She does not wnt her to pulmonary habilitation because se exrcises on the treadmill although she does not monitor her saturations.   reports that she quit smoking about 19 years ago. She has a 42.00 pack-year smoking history. she has never used smokeless tobacco.   OV 06/12/2017  Chief Complaint  Patient presents with   Follow-up    Pt states that she has been doing good since last visit. States that she has a "tickle cough" that is sporadic and has SOB that she states is when she climbs 2 flights of stairs. Denies any CP.    Follow-up combined emphysema was interstitial lung disease [autoimmune undifferentiated connective tissue disease interstitial lung disease]. Autoimmune features from May 2018 rheumatology noted with Dr. Baker Bon: Raynaud phenomenon positive without gangrene, positive anti-cardiolipin and positive IgM and SCL-70 positive without clinical features of  scleroderma    Last seen June 2018. Since then she's stable. Overall she tells me that only problem is a tickle in her throat and slight cough. This is up and down depending on the pollen exposure she gets. She gets dyspneic for climbing few to several flights of stairs. This is unchanged. She did have full function test today and that shows mild significant worsening though overall gradient is only mild.. However she's not feeling this. She has appointment with Dr. Baker Bon  pending    OV 07/18/2017  Chief Complaint  Patient presents with   Follow-up    HRCT done 07/03/17.  Pt states she is the same as she was at last visit. SOB with exertion.    Follow-up combined emphysema was interstitial lung disease [autoimmune undifferentiated connective tissue disease interstitial lung disease]. Autoimmune features from May 2018 rheumatology noted with Dr. Baker Bon: Raynaud phenomenon positive without gangrene, positive anti-cardiolipin and positive IgM and SCL-70 positive without clinical features of scleroderma    LISBETH PULLER presents for follow-up.  She is here to discuss the results of a high-resolution CT scan of the chest.  The scan was reviewed by Dr. Kareen Osier thoracic radiology who feels that patient has probable UIP pattern that is definitely progressive compared to August 2017 although there is no comment about progression since June 2018 CT scan.  The pulmonary nodules itself are stable since August 2017 and are likely benign.  The change in the CT scan which is mild progression pulmonary fibrosis corresponds with the pulmonary function test that shows mild progression.  She is now here with her daughter Cleveland Dales and her husband.  She also states that 1 of her other daughters has rheumatoid arthritis and is on TNF alpha blockade.  All her 3 daughters have hemo-chromatosis    OV 08/29/2017  Chief Complaint  Patient presents with   Follow-up    Pt states she has been doing good since last visit. Pt  recently went to [place]  and had some mild problems with SOB due to heat.  Pt was prescribed cellcept  and bactrim  but has not yet started meds due to questions with other meds she is taking.    83 year old female with ILD secondary to autoimmune disease clinically suspicious of scleroderma but also previous history of hemochromatosis with family history of hemochromatosis.  She is here with her husband.  This visit is only a discussion visit.  Because she has many questions about starting CellCept  before she actually does.  We did hepatitis virus panel, QuantiFERON gold, G6PD and all this is normal.  Lab work is normal.  I checked with [facility] pharmacist about interactions and was cleared for her to start CellCept .  The only recommendation was for patient to come off Prilosec because of reduced effect of CellCept .  Patient questions revolved around CellCept : She does not want to come off PPI because of hiatal hernia and has had bad acid  reflux.  So I would not Ruta Cousins asking for any alternative PPI.  In addition she states she takes multiple multivitamins including Biotene, vitamin D3, vitamin D , vitamin B12 and folic acid .  She also takes Allegra occasionally.  She wants to make sure it is all okay with CellCept .  She has not had a flu shot today and she is asking if she should have it.  She has never had flu shot before.  She is also wondering about anticoagulation in the setting of CellCept  and thyroid  issues in the setting of Bactrim .  In terms of her genetics: Initially I corresponded with our local geneticist who thought she might be better served at [facility] but the [facility] genetics person wrote to me saying that patient should first be seen by [facility] rheumatology and hematology.  Patient's not so sure she wants to see the subspecialist.  She will speak to Dr. Isidor Marek her local hematologist to see if it is worthwhile seeing the hematology department at [facility].   Lungs/Pleura: Moderate centrilobular emphysema. Mild basilar predominant subpleural reticulation and ground-glass, increased from 02/16/2016. No traction bronchiectasis/ bronchiolectasis, architectural distortion or honeycombing. Side-by-side nodules in the medial left lower lobe measure up to 6 mm (series 3, image 95), unchanged from 02/16/2016 and considered benign. No air trapping. No pleural fluid. Airway is unremarkable.  IMPRESSION: 1. Mild progression in basilar predominant subpleural fibrosis from 02/16/2016, raising suspicion for usual interstitial pneumonitis. 2. Aortic atherosclerosis (ICD10-170.0). Moderate coronary artery calcification. 3.  Emphysema (ICD10-J43.9).     Electronically Signed   By: Shearon Denis M.D.   On: 07/03/2017 09:54    OV 09/19/2017  Chief Complaint  Patient presents with   Follow-up    Pt states she has been doing good. Currently on cellcept  and bactrim  and has been doing well on it once put back on omeprazole. Pt still has the cough and sometimes when trying to take a deep breath can't fully get all O2 out.   Kaye Luoma returns for follow-up.  This is for autoimmune interstitial lung disease.  She was started on CellCept  recently.  She is here to follow-up for therapeutic drug monitoring.  Because she was on omeprazole and we were initiating CellCept  with Bactrim  we asked her to hold off on omeprazole because the omeprazole impairs drug levels of CellCept .  However she tried it for a week and her acid reflux was significant despite ranitidine so she went back on omeprazole.  Pharmacist Ruta Cousins is advising changing the CellCept  to equivaekbtdose of Myfortic . So far tolerating cellcept /pred/bactrimm well. Has seen dR G in heme and on observation. Had PFT today on cellcept /pred/bactrim  and is better! Continue siwht spriva  OV 10/31/2017  . Chief Complaint  Patient presents with   Follow-up    Pt currently taking  myfortic  and states she has been well on that. Pt states she has some mild nausea and has some problems with sleeping at night. SOB is stable,mild coughing. Denies any CP.    Follow-up progressive interstitial lung disease with SCL-70 antibody positive and Raynard Follow-up associated emphysema.  Last visit September 19, 2017.  At that time she had intolerance to the CellCept  therefore we switched her to Myfortic .  She is here to report for follow-up about this.  She is tolerating Myfortic  just well.  She had some transient insomnia for a few days last week but this resolved.  She only has mild intermittent occasional nausea with the Myfortic  but otherwise is  tolerating it really well at the low-dose of 360 mg twice daily.  She continues on prednisone  10 mg daily and Bactrim  3 times a week.  She is due for blood work today.  There are no other new issues.  She continues on Spiriva  for her emphysema   OV 02/01/2018  Chief Complaint  Patient presents with   Follow-up    PFT performed today.  Pt stated other than having shingles earlier, things have been doing good for her. Pt states SOB is stable and states she still has an occ cough.    Follow-up progressive interstitial lung disease with SCL-70 antibody positive and Raynud - IPAF Follow-up associated emphysema   RHETT NAJERA presents for routine follow-up with interstitial lung disease clinic. She has the above problems. In the interim she tells me she had developed shingles on the right neck but this is resolved. This is despite having zoster vaccine in 2012. She was yet to have the bnnew  shingles vaccine.in any event currently she is feeling well. She only has occasional post residual neuropathic pain. In terms of her shortness of breath it is stable and minimal. Walking desaturation test shows stability. Pulmicort function test below show stability. She is tolerating the mycophenolate  myfortic  well at 360 mg twice daily and prednisone  10 mg  daily and Bactrim  for PCP prophylaxis. Overall she feels stable and feels that it is best medicines on touch in terms of dosing change  OV 08/14/2018  Subjective:  Patient ID: [name], female , DOB: [birth year] , age 69 y.o. , MRN: [id number] , ADDRESS: [address] [place] [address]   08/14/2018 -   Chief Complaint  Patient presents with   Follow-up    PFT performed today.  Pt states she is slowly getting better after her recent pna. States she still has some issues with SOB and an occ cough. Pt also states that she feels like she has a weight on her chest at times.    Follow-up progressive interstitial lung disease with SCL-70 antibody positive and Raynud - IPAF - on myfortic  since march 2019 with pred 10 and bactrim   Follow-up associated emphysema - on spiriva     HPIfortic TAIJA MATHIAS 83 y.o. -returns for 67-month follow-up of her interstitial lung disease associated with emphysema.  She presents with her middle daughter Cleveland Dales who is met me before.  She continues on Myfortic  and prednisone  10 mg and Bactrim  prophylaxis.  She continues on Spiriva .  She had an episode of atrial fibrillation according to review of the chart and my on history with in the middle of January 2020.  At this time she had a chest x-ray that showed asymptomatic upper lobe pneumonia.  I personally visualized the chest x-ray and confirmed the findings.  Antibiotic was not prescribed.  Then I follow this up with a CT scan of the chest [we canceled her elective colonoscopy].  CT chest showed improved pneumonia findings.  I prescribed even though she was feeling well cephalexin  for a week.  She says she took it.  She continues to feel well except she has baseline amount of symptoms that is documented below.  Of note the high-resolution CT chest showed an improvement in ILD compared to a year ago.  In fact her pulmonary function tests are also showing stability/improvement as documented below.  She is very  happy about this outcome with immunosuppression .  However, she is a little bit unsure why she is symptomatic in  terms of shortness of breath and cough.  She is very concerned that her fibrosis is getting worse.  She wants to have closer follow-up than every 6 months.   IMPRESSION: HRCT Jan 2020 1. Vaguely bandlike patchy consolidation and ground-glass opacity at the periphery of the right upper and superior segment right lower lobes, new since 07/03/2017 chest CT, improving compared to 07/10/2018 chest radiograph, favoring resolving pneumonia with evolving postinfectious scarring. 2. Previously described basilar predominant subpleural reticulation and ground-glass attenuation is minimal and appears decreased in the interval. No bronchiectasis or honeycombing. Findings may represent an interstitial lung disease such as nonspecific interstitial pneumonia (NSIP) or less likely early usual interstitial pneumonia (UIP). Findings are indeterminate for UIP per consensus guidelines: Diagnosis of Idiopathic Pulmonary Fibrosis: An Official ATS/ERS/JRS/ALAT Clinical Practice Guideline. Am Annie Barton Crit Care Med Vol 198, Iss 5, [phone], Feb 25 2017. 3. Moderate centrilobular emphysema with mild diffuse bronchial wall thickening, suggesting COPD. 4. Two-vessel coronary atherosclerosis. 5. Small hiatal hernia.   Aortic Atherosclerosis (ICD10-I70.0) and Emphysema (ICD10-J43.9). ROS - per HPI    OV 04/22/2019  Subjective:  Patient ID: [name], female , DOB: 24-Nov-1940 , age 62 y.o. , MRN: [id number] , ADDRESS: [address] [place] [address]   Follow-up slowly progressive interstitial lung disease with SCL-70 antibody positive and Raynud - IPAF - on myfortic  since march 2019 with pred 5 and bactrim  3x/week  Follow-up associated emphysema - on spiriva    04/22/2019 -   Chief Complaint  Patient presents with   Follow-up    Patient reports that her breathing is doing well  at this time.      HPI NIKITA SURMAN 83 y.o. -presents for follow-up.  Last seen by myself in early part of 2020 before the pandemic.  At that time CT chest suggested possible progression.  She saw a nurse practitioner in the interim after the onset of the pandemic.  She was anxious about the progression of her ILD so she asked for a CT scan in less than 1 year.  A CT scan of the chest was done in September 2020.  This is deemed stable since early part of the year but with possible progression versus stability since January 2019.  In terms of her symptoms she continues to be stable with very minimal shortness of breath and cough.  Her pulmonary function test below also shows stability.  Her walking desaturation test also shows stability.  She is tolerating her Myfortic , prednisone  and 3 times a week of Bactrim  just fine.  There are no side effects.  Last blood work was in August 2020 and this was reviewed.  We discussed the possibility of switching to antifibrotic nintedanib instead of taking immunosuppressants.  The rational for this would be it is a safer drug with less long-term side effect such as lack of immunosuppression.  However she feels her current regimen is working well for her.  She also feels that she would benefit more from a immunosuppressive drug because of her autoimmune antibodies and therefore she is more inclined to continue with her current immunosuppressive regimen of prednisone  5 mg/day and Myfortic  and Bactrim  3 times a week.        OV 07/04/2019  Subjective:  Patient ID: [name], female , DOB: December 24, 1940 , age 46 y.o. , MRN: [id number] , ADDRESS: [address] [place] [address]   Follow-up mildy progressive - stable interstitial lung disease with SCL-70 antibody positive and Raynud -  IPAF - on myfortic  since march 2019 with pred 5 and bactrim  3x/week  Follow-up associated emphysema  Follow-up Raynard  Has associated hemochromatosis  Has  associated atrial fibrillation on Xarelto    07/04/2019 -   Chief Complaint  Patient presents with   Follow-up    Pt states she has been doing well since last visit and denies any complaints.     HPI KEANU FRICKEY 83 y.o. -last visit was in October 2020.  At that time she was doing well.  Given her continued immunosuppression with prednisone  and Myfortic  we discussed the possibility of switching the Myfortic  to the nintedanib but understanding that there uncertainties whether this is superior approach and if she could end up with temporary side effects of the nintedanib.  Based on that she decided to continue with prednisone , Myfortic  and Bactrim  as she was doing because things are working well.  However she later called in and wanted this change to nintedanib.  We did this change to nintedanib but then her co-pay ended up being over 500+ dollars and then she started also getting worried about the side effects with nintedanib particularly GI.  She is also on Xarelto  for atrial fibrillation.  Therefore she made this visit ahead of schedule to discuss the possibility of making the switch.  I understand from her that it will be 500+ dollars every month with nintedanib as a co-pay.  In addition she is worried about the superiority of taking nintedanib.  She fully understands that Myfortic  over length of time can carry cancer risk and immunosuppression risk but at this point in time she feels well and she is tolerating it well.  She also understands that nintedanib can carry temporary GI side effects.  She also understands that it may not be a superior approach.  She understands it could be an equivalent approach and definitely lower immunosuppression.  Her main systemic immunosuppressive features are lung disease and Raynaud.  She is worried this might flareup.  In terms of her symptoms there is stable as documented below.  Her walking desaturation test is also stable.   Last pulmonary function test  was in August r 2020 Last CT scan of the chest was in September 2020 Last blood work October 2020 Last echocardiogram 2012.  ROS - per HPI  OV 10/22/2019  Subjective:  Patient ID: [name], female , DOB: 24-Jan-1941 , age 45 y.o. , MRN: [id number] , ADDRESS: [address] [place] [address]   Follow-up mildy progressive - stable interstitial lung disease with SCL-70 antibody positive and Raynud - IPAF - on myfortic  since march 2019 with pred 5 and bactrim  3x/week  Follow-up associated emphysema  Follow-up Raynaud  Has associated hemochromatosis  Has associated atrial fibrillation on Xarelto     10/22/2019 -   Chief Complaint  Patient presents with   Follow-up    PFT performed today. Pt states she has been doing good since last visit. Denies any complaints with breathing.     HPI CHRISTIONNA POLAND 83 y.o. -returns for routine follow-up.  Overall doing well.  She feels shortness of breath is stable.  She feels she is tolerating Myfortic  and prednisone  fine no new interim issues she has had a Covid vaccine.  She continues on high risk immunosuppressive therapy.  There is some mild diarrhea with the Myfortic  but it is tolerable.  The shortness of breath scale is stable walking desaturation test is stable.  Her pulmonary function test shows stable FVC with  a possible reduction in diffusion capacity.  I do not know what to make of this at this point in time.  It could be a technical issue because she is feeling stable.  She did have echocardiogram that showed grade 2 diastolic dysfunction.   She continues on her Spiriva  for associated emphysema.  ROS - per HPI     OV 03/03/2020   Subjective:  Patient ID: [name], female , DOB: [birth year], age 39 y.o. years. , MRN: [id number],  ADDRESS: [address] [place] [address] PCP  Perley Bradley, MD Providers : Treatment Team:  Attending Provider: Maire Scot, MD   Chief Complaint  Patient  presents with   Follow-up    f/u pft     Follow-up mildy progressive - stable interstitial lung disease with SCL-70 antibody positive and Raynud - IPAF - on myfortic  since march 2019 with pred 5 and bactrim  3x/week  Follow-up associated emphysema  Follow-up Raynaud  Has associated hemochromatosis  Has associated atrial fibrillation on Xarelto   Mild associated diastolic dysfunction and echo January 2021    HPI JARIYA REICHOW 83 y.o. -returns for follow-up.  Overall she is doing well.  However in the last 1-2 months she says she is slightly more short of breath than usual.  She is also having slight increase in dry cough.  Based on her subjective symptom questionnaires it seems the cough itself is stable but she is definitely saying it is worse.  She is seeing the dyspnea is overall stable compared to the cough but looking at the symptom questionnaire is maybe it is a little worse.  Is hard to discern.  It has been approximately 1 year since she had a high-resolution CT chest.  She is wondering if she can have this repeated.  She is on immunosuppression.  She is also noticing -that at home her pulse oximetry on her finger varies a lot [she does have [name]]) and sometimes can be as low as 88% - 93%.  She does acknowledge that the circulation in her hands is not as good.  Pulse ox here at rest was normal.  She had pulmonary function test that shows stability.-With the FVC although I am not sure what to make of the DLCO.  She has had a Covid booster vaccine  Last echocardiogram January 2021 with mild diastolic dysfunction    IMPRESSION: 1. Very mild basilar predominant subpleural reticulation and ground-glass, similar to minimally progressive from 07/03/2017. Nonspecific interstitial pneumonitis is favored. Findings are indeterminate for UIP per consensus guidelines: Diagnosis of Idiopathic Pulmonary Fibrosis: An Official ATS/ERS/JRS/ALAT Clinical Practice Guideline. Am Annie Barton Crit Care Med Vol 198, Iss 5, [phone], Feb 25 2017. 2. Aortic atherosclerosis (ICD10-170.0). Coronary artery calcification. 3. Enlarged pulmonic trunk, indicative of pulmonary arterial hypertension. 4.  Emphysema (ICD10-J43.9).     Electronically Signed   By: Shearon Denis M.D.   On: 03/20/2019 14:47  Echocardiogram -January 2021: Shows grade 2 diastolic dysfunction but otherwise unremarkable.   ROS - per HPI  HPI GEORGIANA SPILLANE 83 y.o. -last visit was in September 2021.  She continues to do well overall.  Shortness of breath symptom question is stable.  Walking desaturation test is stable.  She is compliant with her medications.  In September 2021 there is concern that her ILD was getting worse because of change in PFT.  So we did a CT scan of the chest radiologist reported this to be stable since 2017.  Most recently she  had blood work in December 2021 chemistry panel is normal and I reviewed it.  CBC panel is also normal with stable mild anemia hemoglobin 12.4 g%.  She is up-to-date with her COVID vaccine including booster.  She had pulmonary function test today that showed some fluctuation.  Her main concern is that she is coughing more  In terms of her cough it is dry and has a barking quality to it.  Or laryngeal quality to it.  It is mostly at day and night but never wakes her up at night.  When she has it at night it is only when she wakes up.  Talking makes it worse.  Laughing makes it worse.  Drinking water makes it better.  She thinks it is progressive.  Symptom score showed the cough is getting worse and deep.  She has no fever or chills.  No desaturations.  We discussed the possibility of opportunistic infection but she rightfully questant if it could be opportunistic infection if everything else is stable.  There is no wheezing.  She is wondering if it could be because of allergies.  She has seasonal allergies.  Review of the records indicate this has not been  investigated.  She is willing to have an investigation for this.  We discussed the possibility of cough neuropathy.  She recollects has had gabapentin  before when she had shingles.  She did not have any side effects from it.     CT Chest data sept 2021  TECHNIQUE: Multidetector CT imaging of the chest was performed following the standard protocol without intravenous contrast. High resolution imaging of the lungs, as well as inspiratory and expiratory imaging, was performed.   COMPARISON:  09/09/2019, 03/20/2019, 07/03/2017, 12/06/2016, 06/15/2016   FINDINGS: Cardiovascular: Aortic atherosclerosis. Normal heart size. Three-vessel coronary artery calcifications. No pericardial effusion.   Mediastinum/Nodes: No enlarged mediastinal, hilar, or axillary lymph nodes. Thyroid  gland, trachea, and esophagus demonstrate no significant findings.   Lungs/Pleura: Moderate centrilobular emphysema. No significant interval change in subtle irregular peripheral interstitial opacity predominantly seen in the bilateral lung bases, although also noted in nondependent portions of the right middle lobe and lingula. Mild lobular air trapping on expiratory phase imaging. Stable, definitively benign and faintly rim calcified 6 mm nodule of the dependent left lower lobe (series 5, image 86). No pleural effusion or pneumothorax.   Upper Abdomen: No acute abnormality.   Musculoskeletal: No chest wall mass or suspicious bone lesions identified.   IMPRESSION: 1. No significant interval change in subtle irregular peripheral interstitial opacity predominantly seen in the bilateral lung bases, although also noted in nondependent portions of the right middle lobe and lingula. There is no appreciable change in these findings on multiple prior examinations dating back to 2017. Bland post infectious or inflammatory scarring is favored, although minimal fibrotic interstitial lung disease is not excluded and  if characterized by ATS pulmonary fibrosis criteria, findings are consistent with an early "indeterminate for UIP" pattern. Consider ongoing follow-up CT to assess for stability of fibrotic findings. Findings are indeterminate for UIP per consensus guidelines: Diagnosis of Idiopathic Pulmonary Fibrosis: An Official ATS/ERS/JRS/ALAT Clinical Practice Guideline. Am Annie Barton Crit Care Med Vol 198, Iss 5, [phone], Feb 25 2017. 2. Emphysema (ICD10-J43.9). 3. Coronary artery disease.  Aortic Atherosclerosis (ICD10-I70.0).     Electronically Signed   By: Hubbard Mad M.D.   On: 03/16/2020 14:12  OV 02/23/2021  Subjective:  Patient ID: [name], female , DOB: 1941-01-06 , age 85 y.o. ,  MRN: [id number] , ADDRESS: [address] [place] [address] PCP Perley Bradley, MD Patient Care Team: Perley Bradley, MD as PCP - General (Family Medicine) Verona Goodwill, MD as PCP - Cardiology (Cardiology) Scotty Cyphers, MD as Consulting Physician (Hematology) Verona Goodwill, MD as Consulting Physician (Cardiology) Serafin Dames, MD as Consulting Physician (Gastroenterology)  This Provider for this visit: Treatment Team:  Attending Provider: Maire Scot, MD    02/23/2021 -   Chief Complaint  Patient presents with   Follow-up    Pt states she has been doing okay since last visit and denies any complaints.    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  Follow-up associated emphysema  Follow-up Raynaud  Has associated hemochromatosis  Has associated atrial fibrillation on Xarelto   Mild associated diastolic dysfunction and echo January 2021  HPI JOUD PETTINATO 83 y.o. -returns for her 50-month follow-up.  Last visit with me was in February 2022.  Is   She continues on Myfortic  prednisone  and Bactrim .  We  held off on nintedanib given stability.  She did have spirometry in February 2022.?  There is a slight decline but she feels stable.  Symptom scores are stable.  Walking desaturation test is below and is stable versus a three-point drop that is?  Significant..  Last CT scan September 2021.  But clinically she feels stable.  We held off on nintedanib last visit because of stability.       OV 07/23/2021  Subjective:  Patient ID: [name], female , DOB: 07/15/1940 , age 70 y.o. , MRN: [id number] , ADDRESS: [address] [place] [address] PCP Perley Bradley, MD Patient Care Team: Perley Bradley, MD as PCP - General (Family Medicine) Verona Goodwill, MD as PCP - Cardiology (Cardiology) Scotty Cyphers, MD as Consulting Physician (Hematology) Verona Goodwill, MD as Consulting Physician (Cardiology) Serafin Dames, MD as Consulting Physician (Gastroenterology)  This Provider for this visit: Treatment Team:  Attending Provider: Maire Scot, MD    07/23/2021 -   Chief Complaint  Patient presents with   Follow-up    Pt states she has been doing okay and denies any complaints.    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  Follow-up associated emphysema  Follow-up Raynaud  Has associated hemochromatosis  Has associated atrial fibrillation on Xarelto   Mild associated diastolic dysfunction and echo January 2021  HPI ROSEALYNN MATEUS 83 y.o. -returns for 22-month follow-up.  Last seen in August 2022.  She feels since then dyspnea slightly worse.  This is reflected in the symptom score below.  In December 2022 he did have COVID and she recovered from it.  She does not think the decline is because of the COVID.  She continues on her prednisone  Bactrim  and Myfortic .  Her last CT scan of the chest was in  September 2021.  Last echocardiogram was in January 2021.  Last pulmonary function test was almost 1 year ago in February 2022.  She is wondering about repeat investigations.  She is concerned about worsening ILD.    Of note she just turned 83 years old.  OV 09/09/2021  Subjective:  Patient ID: Edmon Gosling  Viann Graces, female , DOB: 06/01/1941 , age 33 y.o. , MRN: [id number] , ADDRESS: [address] [place] [address] PCP Perley Bradley, MD Patient Care Team: Perley Bradley, MD as PCP - General (Family Medicine) Verona Goodwill, MD as PCP - Cardiology (Cardiology) Scotty Cyphers, MD as Consulting Physician (Hematology) Verona Goodwill, MD as Consulting Physician (Cardiology) Serafin Dames, MD as Consulting Physician (Gastroenterology)  This Provider for this visit: Treatment Team:  Attending Provider: Maire Scot, MD    09/09/2021 -   Chief Complaint  Patient presents with   Follow-up    PFT performed today. Pt states she has been doing okay and denies any complaints.    HPI TAVI GAUGHRAN 83 y.o. -returns for follow-up.  At this point in time she is feeling stable.  She had work-up to see if her ILD is worse or she has developed pulmonary hypertension.  CT scan shows stability since 2021.  Her pulmonary function test shows a 3% decline in DLCO.  On an average she has 3% decline per year over the last 4 years.  At this point in time she is feeling well.  No side effects from Myfortic .  She asked about options if she were to get worse.  We discussed about nintedanib.  Also indicated there is an injection [tocilizumab] but at this point in time she is fine with the choices.  She does understand Myfortic  is immunosuppressant and there is also cancer risk.  She is decided to continue.  She also has mild emphysema for which she is on Spiriva  and it is stable.  I gave her a book called Thursdays with Madge Schiller written by the patient who has pulmonary fibrosis and scleroderma.  She  is going to read it.  We did discuss briefly clinical trials as a care option and she is interested  -No clearing of the throat.  -She has seen rheumatology recently.  She continues to have [name].  They have a nitroglycerin .  She does not want to try it.  We discussed about using heart warmers.  She is going to buy this   CT Chest data  XR C-ARM NO REPORT  Result Date: 09/07/2021 Please see Notes tab for imaging impression.      OV 04/21/2022  Subjective:  Patient ID: [name], female , DOB: 1940-12-07 , age 71 y.o. , MRN: [id number] , ADDRESS: [address] [place] [address] PCP Perley Bradley, MD Patient Care Team: Perley Bradley, MD as PCP - General (Family Medicine) Verona Goodwill, MD as PCP - Cardiology (Cardiology) Scotty Cyphers, MD as Consulting Physician (Hematology) Verona Goodwill, MD as Consulting Physician (Cardiology) Serafin Dames, MD as Consulting Physician (Gastroenterology)  This Provider for this visit: Treatment Team:  Attending Provider: Maire Scot, MD  04/21/2022 -   Chief Complaint  Patient presents with   Follow-up    6 Follow-up PT states no changes, DOE (upstairs, fast paced walking)     HPI KEIGHLEY DECKMAN 83 y.o. -returns for follow-up.  She tells me that since last seeing me since the summer or so she is slightly more short of breath especially when she climbs stairs but overall symptom score is stable.  In September 2023 she went on vacation to [facility] [place]  and came back with her girlfriends and she had a great time but she did notice more shortness of breath than usual.  She attributed this to heavier exertion but nevertheless with stairs she is  finding herself more short of breath.  She is compliant with Spiriva  for associated emphysema Myfortic  Bactrim  and daily prednisone .  2 days ago she saw Jacinta Martinis rheumatology PA for her scleroderma and has been deemed stable  Pulmonary function test shows  slight reduction in FVC over time but still within normal limits but the DLCO is dramatically low.  There is no anemia correction on the DLCO.  When I did a rough anemia correction it appears that DLCO might be okay.  Of asked for a formal correction on the DLCO for anemia.  Given her anemia which appears to be relatively more pronounced compared to 05-02-23greater than 1.5 g% drop she has been referred by rheumatology to hematology because of history of hemochromatosis.  Her last echo and CT scan of the chest were earlier this year  We discussed about the 3 different respiratory vaccines.  Strongly recommended COVID mRNA because she is immunocompromised.  She did have outpatient COVID December 2022 but it was a different strain.  Also recommend high-dose flu shot which she will get today and we discussed RSV vaccine.     PFT  OV 05/26/2022  Subjective:  Patient ID: [name], female , DOB: 19-Jun-1941 , age 59 y.o. , MRN: [id number] , ADDRESS: [address] [place] [address] PCP Perley Bradley, MD Patient Care Team: Perley Bradley, MD as PCP - General (Family Medicine) Verona Goodwill, MD as PCP - Cardiology (Cardiology) Verona Goodwill, MD as Consulting Physician (Cardiology) Serafin Dames, MD as Consulting Physician (Gastroenterology) Ander Bame, MD as Consulting Physician (Hematology and Oncology)  This Provider for this visit: Treatment Team:  Attending Provider: Maire Scot, MD    05/26/2022 -   Chief Complaint  Patient presents with   Acute Visit    Pt started having complaints of dry cough, wheezing, and chest discomfort which began 1 week ago. Has done multiple covid tests which all have come back negative. States she is now coughing up phlegm and also has complaints of a headache and has no energy.   HPI RAYANA GEURIN 83 y.o. -she is making an acute visit.  She presents with her daughter Cleveland Dales who I have met before.  She was on a  [place] cruise out of [place] between May 14, 2022 and Saturday after Thanksgiving May 20, 2022.  On Friday after Thanksgiving she suddenly took ill.  Symptoms got worse on Saturday when she returned.  She describes laryngitis ear pain cough headaches sore throat runny nose brain fog increased fatigue and sleeping.  COVID test x 2 was negative.  She called our office and we gave her antibiotic and prednisone  because the sputum is now yellow.  She is taking this but she wanted an acute visit today.  Some symptoms are better.  She is saying that her headache is better sore throat is better congestion is better and the tiredness is better but according to the daughter she still sleeping a lot.  Her cough nature has changed and she is got some yellow sputum now.  So some symptoms are not better.  Walking desaturation test is stable but she only walked 2 laps and she stopped because of fatigue.  The cough is particularly worse at night.  She does not want any cough medication  OV 07/08/2022  Subjective:  Patient ID: [name], female , DOB: 1940-10-25 , age 80 y.o. , MRN: [id number] , ADDRESS: [address] [place] [address] PCP Perley Bradley, MD Patient Care Team: Perley Bradley, MD as PCP - General (Family Medicine) Verona Goodwill, MD as PCP - Cardiology (Cardiology) Verona Goodwill, MD as Consulting Physician (Cardiology) Serafin Dames, MD as Consulting Physician (Gastroenterology) Ander Bame, MD as Consulting Physician (Hematology and Oncology)  This Provider for this visit: Treatment Team:  Attending Provider: Maire Scot, MD     07/08/2022 -   Chief Complaint  Patient presents with   Follow-up    Spiro/dlco done today. She states had a hospitalization 06/20/22 with compression fx. Her cough is about the same- occ prod and has to clear throat but does not cough out the sputum. Breathing is at baseline.      HPI NDIA SAMPATH 83 y.o.  -returns for follow-up.  I saw her just after Thanksgiving 2023 when she had come back from a cruise and was having respiratory symptoms of acute bronchitis.  She at that time was through her doxycycline  and prednisone  taper she had significant cough.  It appears that after that visit her cough continued unabated.  She was coughing a lot at night.  Then she ended up in December 2023 according to history and review of the external records in the hospital with L2 spinal compression fracture.  She believes the cough caused it.  I did explain to her that I have clinically not seen lumbar spinal compression fracture from cough [I subsequently discussed with Dr. Vernestine Gondola about cough expert and he has seen a few patients with lumbar spine compression fracture with severe cough].  In the hospital hemoglobin was 12.3 g%.  And a creatinine was 0.8 mg percent.  She is now back home she is improving.  She is status post kyphoplasty 06/30/2022.  She still has a cough she says she is 80% better compared to when she saw me in November 2023.  She still has some nighttime cough.  She is willing to try some Tessalon  Perles all she is tried so far is Delsym cough syrup.  She continues on her chronic immunosuppressive medications.  At the last visit I ordered an echocardiogram: This is currently pending and is going to be done soon  At the last visit I ordered high-resolution CT chest but in the hospital she did have a CT angiogram: Mild ILD is reported although this is a contrasted CT.  This was done on 06/20/2022.  I personally visualized and independently agree with the findings  She did a pulmonary function test today: This is stable.     OV 11/24/2022  Subjective:  Patient ID: [name], female , DOB: May 01, 1941 , age 43 y.o. , MRN: [id number] , ADDRESS: [address] [place] [address] PCP Perley Bradley, MD Patient Care Team: Perley Bradley, MD as PCP - General (Family Medicine) Verona Goodwill, MD as PCP - Cardiology (Cardiology) Verona Goodwill, MD as Consulting Physician (Cardiology) Serafin Dames, MD as Consulting Physician (Gastroenterology) Ander Bame, MD as Consulting Physician (Hematology and Oncology)  This Provider for this visit: Treatment Team:  Attending Provider: Maire Scot, MD  11/24/2022 -   Chief Complaint  Patient presents with   Follow-up    Annual f/up     HPI [name] 83 y.o. -returns for follow-up.  She says her shortness of breath itself is stable.  Exercise size sit/stand desaturation test today stable.  No new medical problems.  No emergency room visits.  However on [employer]  day weekend she did go to the emergency room and had an infection of the right small toe.  She is on doxycycline .  Other than that she is well.  But the main issue is that her chronic severe cough persist.  This cough got worse in November 2023/January 2024 after a cruise and viral infection and spinal fracture.  This continues unabated.  Wakes her up at night.  As a tickling of the chest there is no wheezing there is no fever there is a dry cough.  Review of the labs indicate she has never had allergy panel test.  Her eosinophils are slightly elevated.  Her last CT scan of the chest was in December 2023 but was an angiogram.  Showed mild ILD.   feNO could NOT do  OV 12/15/2022  Subjective:  Patient ID: [name], female , DOB: Sep 30, 1940 , age 29 y.o. , MRN: [id number] , ADDRESS: [address] [place] [address] PCP Perley Bradley, MD Patient Care Team: Perley Bradley, MD as PCP - General (Family Medicine) Verona Goodwill, MD as PCP - Cardiology (Cardiology) Verona Goodwill, MD as Consulting Physician (Cardiology) Serafin Dames, MD as Consulting Physician (Gastroenterology) Ander Bame, MD as Consulting Physician (Hematology and Oncology)  This Provider for this visit: Treatment Team:  Attending Provider: Maire Scot,  MD   12/15/2022 -   Chief Complaint  Patient presents with   Follow-up    F/up on PFT and blood work results   Type of visit: Video Virtual Visit Identification of patient SARY BOGIE with 02-12-1941 and MRN [id number] - 2 person identifier Risks: Risks, benefits, limitations of telephone visit explained. Patient understood and verbalized agreement to proceed Anyone else on call: just patien Patient location: her home This provider location: [address]; [place]; [place] [address]. [facility]. [phone]    HPI GILBERT NARAIN 83 y.o. -returns for follow-up on this video visit.  This video visit is to discuss some of the evaluation we did for chronic cough.  I noticed that her cat dander is slightly positive but she tells me that she has no cats at all and no exposure to cats recently.  Otherwise allergy panel is negative.  She did have pulmonary function test yesterday and shows continued stability.  Overall in terms of her symptoms she tells me she stable she is continuing her medications.  She says the cough is slightly better.  The frequency is much less but when it comes to severe it is the same but overall she feels improved and stable.  She feels it was the allergy season that made her cough more.  We discussed the potential role of bronchoscopy with lavage rule out opportunistic infections which I said was low probability of finding alternative etiologies for cough.  We also discussed the option of waiting and watching.  She prefer the latter.  Will see her back in 6 months with a spirometry and DLCO.     OV 07/06/2023  Subjective:  Patient ID: [name], female , DOB: [birth year] , age 76 y.o. 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  Has associated hemochromatosis  Has associated atrial fibrillation on Xarelto   Mild associated diastolic dysfunction in pst but not on echo  Feb 2023  LBP with L2 compression frcture status post kyphoplasty 1//24.- dec 2023 after viral inection during cruise nov 2023  07/06/2023 -   Chief Complaint  Patient presents with   Follow-up    Pft f/u, denies any concerns states she is the same.      HPI ANERI SLAGEL 83 y.o. -returns for a 39-month follow-up.  She says she has been doing well from a respiratory standpoint.  She continues on Myfortic  without any side effects other than very occasional diarrhea.  She continues low-dose prednisone  5 mg and also Bactrim  prophylaxis.  For associated emphysema she continue Spiriva .  She says she safety labs not otherwise specified at primary care office last month.  She is reluctant to get them tested today.  Most recent labs in October 2024 liver function test and chemistries were normal within our system.  Therefore I have accepted that.  She states on June 25, 2023 she fell on a staircase and sustained a 7-10 cm longitudinal laceration on the distal third of the left shin.  It is nonhealing at this point.  There was a lot of oozing of blood.  Since yesterday she is at the wound center.  Did explain  to her along with aging and chronic prednisone  contributing to poor wound healing.  Asked if she is willing to taper the prednisone  slowly.  She is willing to give this a try.  She had pulmonary function test today and her lung function stable for the last 2 years.  Last CT scan was 2 years ago and she is willing to have another 1 at follow-up.     OV 11/16/2023  Subjective:  Patient ID: [name], female , DOB: Dec 25, 1940 , age 41 y.o. , MRN: [id number] , ADDRESS: [address] [place] [address] PCP Perley Bradley, MD Patient Care Team: Perley Bradley, MD as PCP - General (Family Medicine) Verona Goodwill, MD as PCP - Cardiology (Cardiology) Verona Goodwill, MD as Consulting Physician (Cardiology) Serafin Dames, MD as Consulting Physician (Gastroenterology) Ander Bame, MD as Consulting Physician (Hematology and Oncology)  This Provider for this visit: Treatment Team:  Attending Provider: Maire Scot, MD    11/16/2023 -   Chief Complaint  Patient presents with   Follow-up    Breathing is doing well and she denies any new co's.      HPI JOSEPHA BARBIER 83 y.o. -returns for follow-up.  She reports overall stability in pulmonary health.  Last visit because of prolonged prednisone  intake for many years and ongoing pulmonary stability and because of shin ulcer I recommend coming off prednisone .  By March April 2025 she had significant prednisone  withdrawal and that she started having cough and fatigue and feeling poorly.  She went back on prednisone  and since then she is feeling well.  The cough is just minimal.  Shortness of  breath is stable.  She CT scan of the chest that I personally visualized show due to her ILD stable now for couple of years.  She is pleased by this.  In April 2025 she did have blood labs that can be considered as part of Myfortic  monitoring and these are stable.  Since her last visit she has seen oncology who is monitoring her anemia.   She is also seeing GI because of her diarrhea and they are going to do procedures on her.  She and I discussed the fact diarrhea could be because of Myfortic   She also had a CT head as follow-up with her neurologist.  SYMPTOM SCALE - ILD 07/06/2023 11/16/2023   Current weight    O2 use ra ra  Shortness of Breath 0 -> 5 scale with 5 being worst (score 6 If unable to do)   At rest 0 1  Simple tasks - showers, clothes change, eating, shaving 1 1.5  Household (dishes, doing bed, laundry) 1 1.5  Shopping 1 1  Walking level at own pace 1 1.5  Walking up Stairs 2 2  Total (30-36) Dyspnea Score 6 8.5  How bad is your cough? 3 2  How bad is your fatigue 2 2  How bad is nausea 1 0.5  How bad is vomiting?  0 0  How bad is diarrhea? 2 2,5  How bad is anxiety? 0 0  How bad is depression 0 0  Any chronic pain - if so where and how bad 1 0        SIT STAND TEST - goal 15 times   11/16/2023    O2 used ra   PRobe - finter or forehead finger   Number sit and stand completed - goal 15 15   Time taken to complete 45 sec   Resting Pulse Ox/HR/Dyspnea  99% and 63/min and dyspnea of 1/10    Peak measures 99 % and 77/min and dyspnea of 2/10   Final Pulse Ox/HR 96% and 69/min and dyspnea of 1/10   Desaturated </= 88% nono   Desaturated <= 3% points yes   Got Tachycardic >/= 90/min no   Miscellaneous comments x      CT Chest data from date: apiril 2025  - personally visualized and independently interpreted : yes - my findings are: as below Narrative & Impression  CLINICAL DATA:  Diffuse/interstitial lung disease.   EXAM: CT CHEST WITHOUT CONTRAST   TECHNIQUE: Multidetector CT imaging of the chest was performed following the standard protocol without intravenous contrast. High resolution imaging of the lungs, as well as inspiratory and expiratory imaging, was performed.   RADIATION DOSE REDUCTION: This exam was performed according to the departmental dose-optimization program which  includes automated exposure control, adjustment of the mA and/or kV according to patient size and/or use of iterative reconstruction technique.   COMPARISON:  06/20/2022 and 08/24/2021.   FINDINGS: Cardiovascular: Atherosclerotic calcification of the aorta, aortic valve and coronary arteries. Enlarged pulmonic trunk and heart. No pericardial effusion.   Mediastinum/Nodes: No pathologically enlarged mediastinal or axillary lymph nodes. Hilar regions are difficult to definitively evaluate without IV contrast. Esophagus is grossly unremarkable.   Lungs/Pleura: Centrilobular and paraseptal emphysema. Mild basilar predominant subpleural reticular densities and ground-glass, similar to 06/20/2022. There may be minimal associated bronchiolectasis. No honeycombing. Posterior left lower lobe nodule measures 5 mm (4 x 6 mm, 7/81), unchanged from 06/20/2022. Per Fleischner Society guidelines, no follow-up is necessary. No new pulmonary nodules. No pleural  fluid. Airway is unremarkable. Minimal air trapping.   Upper Abdomen: Small hiatal hernia. Visualized portions of the liver, gallbladder, adrenal glands, kidneys, spleen, pancreas, stomach and bowel are otherwise grossly unremarkable. No upper abdominal adenopathy.   Musculoskeletal: Upper lumbar vertebral body augmentations. Osteopenia. Degenerative changes in the spine. Age indeterminate mild T10 compression fracture.   IMPRESSION: 1. Mild basilar predominant subpleural reticular densities and ground-glass with questionable bronchiolectasis, similar to 06/20/2022. Findings are categorized as probable UIP per consensus guidelines: Diagnosis of Idiopathic Pulmonary Fibrosis: An Official ATS/ERS/JRS/ALAT Clinical Practice Guideline. Am Annie Barton Crit Care Med Vol 198, Iss 5, [phone], Feb 25 2017. Findings are indeterminate for UIP per consensus guidelines: Diagnosis of Idiopathic Pulmonary Fibrosis: An Official ATS/ERS/JRS/ALAT  Clinical Practice Guideline. Am Annie Barton Crit Care Med Vol 198, Iss 5, [phone], Feb 25 2017. 2. Minimal air trapping is indicative of small airways disease. 3. Age indeterminate mild T10 compression fracture. 4. Aortic atherosclerosis (ICD10-I70.0). Coronary artery calcification. 5. Enlarged pulmonic trunk, indicative of pulmonary arterial hypertension. 6.  Emphysema (ICD10-J43.9).     Electronically Signed   By: Shearon Denis M.D.   On: 10/18/2023 15:57    PFT     Latest Ref Rng & Units 07/06/2023   11:26 AM 12/14/2022   10:27 AM 07/08/2022    2:57 PM 04/12/2022   10:27 AM 09/09/2021   10:46 AM 08/18/2020    2:49 PM 02/28/2020    4:16 PM  PFT Results  FVC-Pre L 2.57  2.57  2.52  2.48  2.67  2.78  2.90   FVC-Predicted Pre % 102  102  99  96  103  105  108   Pre FEV1/FVC % % 71  70  73  72  68  68  66   FEV1-Pre L 1.83  1.80  1.84  1.80  1.81  1.88  1.92   FEV1-Predicted Pre % 98  96  97  93  94  96  96   DLCO uncorrected ml/min/mmHg 11.05  11.48  11.51  9.26  11.33  11.74  11.88   DLCO UNC% % 60  62  61  49  60  62  63   DLCO corrected ml/min/mmHg 11.05  11.78  11.93  9.26  12.46  11.74  11.88   DLCO COR %Predicted % 60  63  64  49  66  62  63   DLVA Predicted % 66  71  73  57  70  65  66        LAB RESULTS last 96 hours No results found.       has a past medical history of Atrial fibrillation (HCC), Chest pain, Depression with anxiety, GERD (gastroesophageal reflux disease), Hemochromatosis, History of syncope, Hypothyroidism, and Osteoporosis.   reports that she quit smoking about 26 years ago. Her smoking use included cigarettes. She started smoking about 68 years ago. She has a 42 pack-year smoking history. She has never been exposed to tobacco smoke. She has never used smokeless tobacco.  Past Surgical History:  Procedure Laterality Date   IR KYPHO LUMBAR INC FX REDUCE BONE BX UNI/BIL CANNULATION INC/IMAGING  06/30/2022   IR KYPHO LUMBAR INC FX REDUCE BONE BX  UNI/BIL CANNULATION INC/IMAGING  08/16/2022   MOUTH SURGERY     SHOULDER ARTHROSCOPY Left    TOTAL ABDOMINAL HYSTERECTOMY     VARICOSE VEIN SURGERY      No Known Allergies  Immunization History  Administered Date(s) Administered  Fluad Quad(high Dose 65+) 02/18/2019, 04/21/2022   Fluzone Influenza virus vaccine,trivalent (IIV3), split virus 03/27/2019   Influenza, High Dose Seasonal PF 08/29/2017, 04/26/2018, 04/29/2020, 05/12/2021   PFIZER(Purple Top)SARS-COV-2 Vaccination 07/22/2019, 08/12/2019, 03/11/2020   PNEUMOCOCCAL CONJUGATE-20 05/31/2023   Pneumococcal Conjugate-13 06/12/2014   Pneumococcal Polysaccharide-23 06/27/2010, 04/25/2018   Rsv, Bivalent, Protein Subunit Rsvpref,pf Pattricia Bores) 06/14/2023   Tdap 03/16/2011, 07/05/2019   Zoster Recombinant(Shingrix) 04/08/2019   Zoster, Live 06/27/2010, 04/08/2019    Family History  Problem Relation Age of Onset   Stroke Mother    Heart disease Father 7   Hemochromatosis Brother    Breast cancer Neg Hx      Current Outpatient Medications:    acetaminophen  (TYLENOL ) 325 MG tablet, Take 650 mg by mouth every 6 (six) hours as needed for mild pain or headache., Disp: , Rfl:    apixaban  (ELIQUIS ) 5 MG TABS tablet, TAKE 1 TABLET BY MOUTH TWICE DAILY, Disp: 60 tablet, Rfl: 5   atorvastatin  (LIPITOR) 10 MG tablet, Take 10 mg by mouth daily., Disp: , Rfl:    cyanocobalamin (VITAMIN B12) 500 MCG tablet, Take 500 mcg by mouth daily., Disp: , Rfl:    flecainide  (TAMBOCOR ) 100 MG tablet, TAKE 1 TABLET(100 MG) BY MOUTH TWICE DAILY, Disp: 180 tablet, Rfl: 3   folic acid  (FOLVITE ) 1 MG tablet, Take 1 mg by mouth daily., Disp: , Rfl:    HYDROcodone -acetaminophen  (NORCO/VICODIN) 5-325 MG tablet, Take 1 tablet by mouth every 6 (six) hours as needed., Disp: 6 tablet, Rfl: 0   levothyroxine  (SYNTHROID , LEVOTHROID) 112 MCG tablet, Take 112 mcg by mouth daily., Disp: , Rfl:    methocarbamol  (ROBAXIN ) 500 MG tablet, Take 1 tablet (500 mg total) by  mouth daily as needed for muscle spasms., Disp: 30 tablet, Rfl: 0   metoprolol  succinate (TOPROL -XL) 25 MG 24 hr tablet, TAKE 1 TABLET BY MOUTH  DAILY (Patient taking differently: Take 25 mg by mouth daily.), Disp: 90 tablet, Rfl: 2   mycophenolate  (MYFORTIC ) 360 MG TBEC EC tablet, Take 1 tablet (360 mg total) by mouth 2 (two) times daily., Disp: 180 tablet, Rfl: 3   Na Sulfate-K Sulfate-Mg Sulfate concentrate (SUPREP BOWEL PREP KIT) 17.5-3.13-1.6 GM/177ML SOLN, Take 1 kit (354 mLs total) by mouth as directed., Disp: 324 mL, Rfl: 0   ondansetron  (ZOFRAN ) 4 MG tablet, Take 1 tablet (4 mg total) by mouth every 8 (eight) hours as needed for nausea or vomiting. (Patient taking differently: Take 4 mg by mouth as needed for nausea or vomiting.), Disp: 90 tablet, Rfl: 3   pantoprazole  (PROTONIX ) 20 MG tablet, Take 20 mg by mouth 2 (two) times daily., Disp: , Rfl:    Polyethyl Glycol-Propyl Glycol (SYSTANE) 0.4-0.3 % SOLN, 1 drop in each eye Ophthalmic as needed, Disp: , Rfl:    predniSONE  (DELTASONE ) 5 MG tablet, Take 1 tablet (5 mg total) by mouth daily with breakfast., Disp: 90 tablet, Rfl: 1   sertraline (ZOLOFT) 25 MG tablet, Take 25 mg by mouth daily., Disp: , Rfl:    SPIRIVA  RESPIMAT 2.5 MCG/ACT AERS, INHALE 2 PUFFS INTO THE LUNGS DAILY, Disp: 4 g, Rfl: 11   sulfamethoxazole -trimethoprim  (BACTRIM  DS) 800-160 MG tablet, Take 1 tablet by mouth 3 (three) times a week. TAKE 1 TABLET BY MOUTH ON MONDAY, WEDNESDAY, FRIDAY, Disp: 90 tablet, Rfl: 3   Vitamin D , Cholecalciferol , 25 MCG (1000 UT) TABS, Take 1 tablet by mouth daily., Disp: , Rfl:       Objective:   Vitals:   11/16/23  1505 11/16/23 1514  BP: 122/68   Pulse: 63   SpO2: 99%   Weight:  133 lb 9.6 oz (60.6 kg)  Height:  5' 3.5" (1.613 m)    Estimated body mass index is 23.29 kg/m as calculated from the following:   Height as of this encounter: 5' 3.5" (1.613 m).   Weight as of this encounter: 133 lb 9.6 oz (60.6  kg).  @WEIGHTCHANGE @  [employer]   11/16/23 1514  Weight: 133 lb 9.6 oz (60.6 kg)     Physical Exam   General: No distress. Looks well O2 at rest: no Cane present: no Sitting in wheel chair: no Frail: no Obese: no Neuro: Alert and Oriented x 3. GCS 15. Speech normal Psych: Pleasant Resp:  Barrel Chest - no.  Wheeze - no, Crackles - yes, No overt respiratory distress CVS: Normal heart sounds. Murmurs - no Ext: Stigmata of Connective Tissue Disease - mild sclerdorma of face HEENT: Normal upper airway. PEERL +. No post nasal drip        Assessment:       ICD-10-CM   1. ILD (interstitial lung disease) (HCC)  J84.9 Pulmonary function test    2. Scl-70 antibody positive  R76.8 Pulmonary function test    3. High risk medication use  Z79.899 Pulmonary function test    4. Scleroderma (HCC)  M34.9 Pulmonary function test    5. Centrilobular emphysema (HCC)  J43.2 Pulmonary function test    6. Therapeutic drug monitoring  Z51.81 Pulmonary function test    7. Current chronic use of systemic steroids  Z79.52 Pulmonary function test    8. Diarrhea due to drug  K52.1 Pulmonary function test         Plan:     Patient Instructions  Interstitial pulmonary disease (HCC) Scl-70 antibody positive [name] phenomenon without gangrene High risk medication use Therapeutic drug monitoring    - CLinically stable ILD x  2-3 years . CT  April 2025 with ild stble ILD and emphsye  - Could not tolertate coming off prednisone  - Glad lower extremity wound healed   Plan -Continue prednisone  5mg  daily -Continue Myfortic  as before - Continue Bactrim  as before -Do Siprio and DLCO in  5 months   Pulmonary emphysema, unspecified emphysema type (HCC)  Plan -  Continue spiriva    Diarrhea -  - could be due to Myfortioc  Plan  - complete GI workup  - if negative can try CAROB FLOUR as follows  - Take CAROB Flour for Diarrhea due to medication as follows Take 1 DESSERT  spoon  size serving [approximately 7 g] before breakfast If still no response in 3 days then add another 7 g at dinner If still no response in 3 days then make it to spoon servings at breakfast and 2 spoon servings at dinner and hold NOTE: Always MIX the CAROB FLOUR with WATER or MILK or JUICE - MIGHT NEED A BLENDER to do it DO NOT EAT CAROB POWER DIRECTLY - it can choke or make you cough   Followup 20 weeks s with Dr. Bertrum Brodie ' 15-minute visit but after  PFT  - symptom score and walk test at followup  - Call or return sooner if needed   FOLLOWUP Return in about 5 months (around 04/17/2024) for 15 min visit, with Dr Bertrum Brodie, after Spiro and DLCO, Face to Face Visit.    SIGNATURE    Dr. Maire Scot, M.D., F.C.C.P,  Pulmonary and Critical Care Medicine Staff Physician,  [facility] Director - Interstitial Lung Disease  Program  Pulmonary Fibrosis [facility] at [facility] [place], [place], [address]  Pager: [phone], If no answer or between  15:00h - 7:00h: call [phone] Telephone: [phone]  4:00 PM 11/16/2023

## 2023-11-16 NOTE — Patient Instructions (Addendum)
 Interstitial pulmonary disease (HCC) Scl-70 antibody positive [name] phenomenon without gangrene High risk medication use Therapeutic drug monitoring    - CLinically stable ILD x  2-3 years . CT  April 2025 with ild stble ILD and emphsye  - Could not tolertate coming off prednisone  - Glad lower extremity wound healed   Plan -Continue prednisone  5mg  daily -Continue Myfortic  as before - Continue Bactrim  as before -Do Siprio and DLCO in  5 months   Pulmonary emphysema, unspecified emphysema type (HCC)  Plan -  Continue spiriva    Diarrhea -  - could be due to Myfortioc  Plan  - complete GI workup  - if negative can try CAROB FLOUR as follows  - Take CAROB Flour for Diarrhea due to medication as follows Take 1 DESSERT spoon  size serving [approximately 7 g] before breakfast If still no response in 3 days then add another 7 g at dinner If still no response in 3 days then make it to spoon servings at breakfast and 2 spoon servings at dinner and hold NOTE: Always MIX the CAROB FLOUR with WATER or MILK or JUICE - MIGHT NEED A BLENDER to do it DO NOT EAT CAROB POWER DIRECTLY - it can choke or make you cough   Followup 20 weeks s with Dr. Bertrum Brodie ' 15-minute visit but after  PFT  - symptom score and walk test at followup  - Call or return sooner if needed

## 2023-11-17 NOTE — Telephone Encounter (Signed)
 Patient with diagnosis of PAF on Eliquis  for anticoagulation.    Procedure: EGD/Colonoscopy  Date of procedure: 12/22/2023   CHA2DS2-VASc Score = 3   This indicates a 3.2% annual risk of stroke. The patient's score is based upon: CHF History: 0 HTN History: 0 Diabetes History: 0 Stroke History: 0 Vascular Disease History: 0 Age Score: 2 Gender Score: 1     CrCl 46 mL/min Platelet count 216 K  Patient has not  had an Afib/aflutter ablation within the last 3 months or DCCV within the last 30 days  Per office protocol, patient can hold Eliquis  for 2 days prior to procedure.     **This guidance is not considered finalized until pre-operative APP has relayed final recommendations.**

## 2023-11-17 NOTE — Telephone Encounter (Signed)
   Patient Name: [name]  DOB: [birth year] MRN: [id number]  Primary Cardiologist: Richardo Chandler, MD  Clinical pharmacists have reviewed the patient's past medical history, labs, and current medications as part of preoperative protocol coverage. The following recommendations have been made:   Per office protocol, patient can hold Eliquis  for 2 days prior to procedure.    I will route this recommendation to the requesting party via Epic fax function and remove from pre-op pool.  Please call with questions.  Francene Ing, Retha Cast, NP 11/17/2023, 3:32 PM

## 2023-11-20 ENCOUNTER — Encounter (HOSPITAL_BASED_OUTPATIENT_CLINIC_OR_DEPARTMENT_OTHER): Payer: Self-pay

## 2023-11-20 ENCOUNTER — Emergency Department (HOSPITAL_BASED_OUTPATIENT_CLINIC_OR_DEPARTMENT_OTHER): Admitting: Radiology

## 2023-11-20 ENCOUNTER — Emergency Department (HOSPITAL_COMMUNITY)

## 2023-11-20 ENCOUNTER — Other Ambulatory Visit: Payer: Self-pay

## 2023-11-20 ENCOUNTER — Observation Stay (HOSPITAL_BASED_OUTPATIENT_CLINIC_OR_DEPARTMENT_OTHER)
Admission: EM | Admit: 2023-11-20 | Discharge: 2023-11-21 | Disposition: A | Discharge status: Home / Self Care | Attending: Family Medicine | Admitting: Family Medicine

## 2023-11-20 DIAGNOSIS — J984 Other disorders of lung: Secondary | ICD-10-CM | POA: Diagnosis not present

## 2023-11-20 DIAGNOSIS — D509 Iron deficiency anemia, unspecified: Secondary | ICD-10-CM | POA: Insufficient documentation

## 2023-11-20 DIAGNOSIS — R55 Syncope and collapse: Secondary | ICD-10-CM | POA: Diagnosis not present

## 2023-11-20 DIAGNOSIS — Z7901 Long term (current) use of anticoagulants: Secondary | ICD-10-CM | POA: Insufficient documentation

## 2023-11-20 DIAGNOSIS — Z79899 Other long term (current) drug therapy: Secondary | ICD-10-CM | POA: Diagnosis not present

## 2023-11-20 DIAGNOSIS — M810 Age-related osteoporosis without current pathological fracture: Secondary | ICD-10-CM | POA: Diagnosis not present

## 2023-11-20 DIAGNOSIS — D7589 Other specified diseases of blood and blood-forming organs: Secondary | ICD-10-CM | POA: Diagnosis not present

## 2023-11-20 DIAGNOSIS — J849 Interstitial pulmonary disease, unspecified: Secondary | ICD-10-CM | POA: Diagnosis present

## 2023-11-20 DIAGNOSIS — Z87891 Personal history of nicotine dependence: Secondary | ICD-10-CM | POA: Diagnosis not present

## 2023-11-20 DIAGNOSIS — I480 Paroxysmal atrial fibrillation: Secondary | ICD-10-CM

## 2023-11-20 DIAGNOSIS — E785 Hyperlipidemia, unspecified: Secondary | ICD-10-CM | POA: Diagnosis not present

## 2023-11-20 DIAGNOSIS — K219 Gastro-esophageal reflux disease without esophagitis: Secondary | ICD-10-CM | POA: Insufficient documentation

## 2023-11-20 DIAGNOSIS — E039 Hypothyroidism, unspecified: Secondary | ICD-10-CM

## 2023-11-20 LAB — CBC
HCT: 40.1 % (ref 36.0–46.0)
Hemoglobin: 13.5 g/dL (ref 12.0–15.0)
MCH: 35.6 pg — ABNORMAL HIGH (ref 26.0–34.0)
MCHC: 33.7 g/dL (ref 30.0–36.0)
MCV: 105.8 fL — ABNORMAL HIGH (ref 80.0–100.0)
Platelets: 213 10*3/uL (ref 150–400)
RBC: 3.79 MIL/uL — ABNORMAL LOW (ref 3.87–5.11)
RDW: 14 % (ref 11.5–15.5)
WBC: 7.5 10*3/uL (ref 4.0–10.5)
nRBC: 0 % (ref 0.0–0.2)

## 2023-11-20 LAB — URINALYSIS, ROUTINE W REFLEX MICROSCOPIC
Bacteria, UA: NONE SEEN
Bilirubin Urine: NEGATIVE
Glucose, UA: NEGATIVE mg/dL
Hgb urine dipstick: NEGATIVE
Ketones, ur: NEGATIVE mg/dL
Nitrite: NEGATIVE
Protein, ur: NEGATIVE mg/dL
Specific Gravity, Urine: 1.003 — ABNORMAL LOW (ref 1.005–1.030)
pH: 7 (ref 5.0–8.0)

## 2023-11-20 LAB — COMPREHENSIVE METABOLIC PANEL WITH GFR
ALT: 21 U/L (ref 0–44)
AST: 20 U/L (ref 15–41)
Albumin: 4.1 g/dL (ref 3.5–5.0)
Alkaline Phosphatase: 85 U/L (ref 38–126)
Anion gap: 13 (ref 5–15)
BUN: 9 mg/dL (ref 8–23)
CO2: 25 mmol/L (ref 22–32)
Calcium: 9.9 mg/dL (ref 8.9–10.3)
Chloride: 102 mmol/L (ref 98–111)
Creatinine, Ser: 0.72 mg/dL (ref 0.44–1.00)
GFR, Estimated: >60 mL/min (ref >60)
Glucose, Bld: 111 mg/dL — ABNORMAL HIGH (ref 70–99)
Potassium: 3.4 mmol/L — ABNORMAL LOW (ref 3.5–5.1)
Sodium: 140 mmol/L (ref 135–145)
Total Bilirubin: 0.4 mg/dL (ref 0.0–1.2)
Total Protein: 6.5 g/dL (ref 6.5–8.1)

## 2023-11-20 LAB — TROPONIN T, HIGH SENSITIVITY: Troponin T High Sensitivity: <15 ng/L (ref <19)

## 2023-11-20 MED ORDER — TRAMADOL HCL 50 MG PO TABS
50.0000 mg | ORAL_TABLET | Freq: Four times a day (QID) | ORAL | Status: DC | PRN
  Administered 2023-11-20: 50 mg via ORAL
  Filled 2023-11-20: qty 1

## 2023-11-20 MED ORDER — UMECLIDINIUM BROMIDE 62.5 MCG/ACT IN AEPB
1.0000 | INHALATION_SPRAY | Freq: Every day | RESPIRATORY_TRACT | Status: DC
  Filled 2023-11-20: qty 7

## 2023-11-20 MED ORDER — LEVALBUTEROL HCL 0.63 MG/3ML IN NEBU: 0.6300 mg | INHALATION_SOLUTION | Freq: Three times a day (TID) | RESPIRATORY_TRACT | Status: DC | PRN

## 2023-11-20 MED ORDER — OXYCODONE HCL 5 MG PO TABS
5.0000 mg | ORAL_TABLET | Freq: Once | ORAL | Status: AC
  Administered 2023-11-20: 5 mg via ORAL
  Filled 2023-11-20: qty 1

## 2023-11-20 MED ORDER — VITAMIN B-12 1000 MCG PO TABS
500.0000 ug | ORAL_TABLET | Freq: Every day | ORAL | Status: DC
  Administered 2023-11-21: 500 ug via ORAL
  Filled 2023-11-20: qty 1

## 2023-11-20 MED ORDER — FOLIC ACID 1 MG PO TABS
1.0000 mg | ORAL_TABLET | Freq: Every day | ORAL | Status: DC
  Administered 2023-11-21: 1 mg via ORAL
  Filled 2023-11-20: qty 1

## 2023-11-20 MED ORDER — METOPROLOL TARTRATE 5 MG/5ML IV SOLN
5.0000 mg | Freq: Once | INTRAVENOUS | Status: AC
  Administered 2023-11-20: 5 mg via INTRAVENOUS
  Filled 2023-11-20: qty 5

## 2023-11-20 MED ORDER — PREDNISONE 5 MG PO TABS
5.0000 mg | ORAL_TABLET | Freq: Every day | ORAL | Status: DC
  Administered 2023-11-21: 5 mg via ORAL
  Filled 2023-11-20 (×2): qty 1

## 2023-11-20 MED ORDER — FLECAINIDE ACETATE 100 MG PO TABS
100.0000 mg | ORAL_TABLET | Freq: Two times a day (BID) | ORAL | Status: DC
  Administered 2023-11-20 – 2023-11-21 (×2): 100 mg via ORAL
  Filled 2023-11-20 (×3): qty 1

## 2023-11-20 MED ORDER — SODIUM CHLORIDE 0.9 % IV BOLUS
500.0000 mL | Freq: Once | INTRAVENOUS | Status: AC
  Administered 2023-11-20: 500 mL via INTRAVENOUS

## 2023-11-20 MED ORDER — ACETAMINOPHEN 650 MG RE SUPP: 650.0000 mg | Freq: Four times a day (QID) | RECTAL | Status: DC | PRN

## 2023-11-20 MED ORDER — LORATADINE 10 MG PO TABS
10.0000 mg | ORAL_TABLET | Freq: Every day | ORAL | Status: DC
  Administered 2023-11-21: 10 mg via ORAL
  Filled 2023-11-20: qty 1

## 2023-11-20 MED ORDER — MYCOPHENOLATE SODIUM 180 MG PO TBEC
360.0000 mg | DELAYED_RELEASE_TABLET | Freq: Two times a day (BID) | ORAL | Status: DC
  Administered 2023-11-20 – 2023-11-21 (×2): 360 mg via ORAL
  Filled 2023-11-20 (×3): qty 2

## 2023-11-20 MED ORDER — VITAMIN D 25 MCG (1000 UNIT) PO TABS
ORAL_TABLET | Freq: Every day | ORAL | Status: DC
  Administered 2023-11-21: 1000 [IU] via ORAL
  Filled 2023-11-20: qty 1

## 2023-11-20 MED ORDER — OXYCODONE HCL 5 MG PO TABS
5.0000 mg | ORAL_TABLET | Freq: Four times a day (QID) | ORAL | Status: DC | PRN
  Administered 2023-11-20 – 2023-11-21 (×3): 5 mg via ORAL
  Filled 2023-11-20 (×3): qty 1

## 2023-11-20 MED ORDER — TIOTROPIUM BROMIDE MONOHYDRATE 2.5 MCG/ACT IN AERS: 2.0000 | INHALATION_SPRAY | Freq: Every day | RESPIRATORY_TRACT | Status: DC

## 2023-11-20 MED ORDER — SULFAMETHOXAZOLE-TRIMETHOPRIM 800-160 MG PO TABS: 1.0000 | ORAL_TABLET | ORAL | Status: DC

## 2023-11-20 MED ORDER — ATORVASTATIN CALCIUM 10 MG PO TABS
10.0000 mg | ORAL_TABLET | Freq: Every day | ORAL | Status: DC
  Administered 2023-11-21: 10 mg via ORAL
  Filled 2023-11-20: qty 1

## 2023-11-20 MED ORDER — ONDANSETRON HCL 4 MG PO TABS: 4.0000 mg | ORAL_TABLET | Freq: Four times a day (QID) | ORAL | Status: DC | PRN

## 2023-11-20 MED ORDER — APIXABAN 5 MG PO TABS
5.0000 mg | ORAL_TABLET | Freq: Two times a day (BID) | ORAL | Status: DC
  Administered 2023-11-20 – 2023-11-21 (×2): 5 mg via ORAL
  Filled 2023-11-20 (×2): qty 1

## 2023-11-20 MED ORDER — IOHEXOL 350 MG/ML SOLN
75.0000 mL | Freq: Once | INTRAVENOUS | Status: AC | PRN
  Administered 2023-11-20: 75 mL via INTRAVENOUS

## 2023-11-20 MED ORDER — ACETAMINOPHEN 325 MG PO TABS: 650.0000 mg | ORAL_TABLET | Freq: Four times a day (QID) | ORAL | Status: DC | PRN

## 2023-11-20 MED ORDER — MELATONIN 5 MG PO TABS
5.0000 mg | ORAL_TABLET | Freq: Every day | ORAL | Status: DC
  Administered 2023-11-20: 5 mg via ORAL
  Filled 2023-11-20: qty 1

## 2023-11-20 MED ORDER — ACETAMINOPHEN 325 MG PO TABS
650.0000 mg | ORAL_TABLET | Freq: Four times a day (QID) | ORAL | Status: DC | PRN
  Administered 2023-11-20 – 2023-11-21 (×3): 650 mg via ORAL
  Filled 2023-11-20 (×3): qty 2

## 2023-11-20 MED ORDER — OXYCODONE HCL 5 MG PO TABS: 5.0000 mg | ORAL_TABLET | Freq: Once | ORAL | Status: DC

## 2023-11-20 MED ORDER — LEVOTHYROXINE SODIUM 112 MCG PO TABS
112.0000 ug | ORAL_TABLET | Freq: Every day | ORAL | Status: DC
  Administered 2023-11-21: 112 ug via ORAL
  Filled 2023-11-20: qty 1

## 2023-11-20 MED ORDER — PANTOPRAZOLE SODIUM 20 MG PO TBEC
20.0000 mg | DELAYED_RELEASE_TABLET | Freq: Two times a day (BID) | ORAL | Status: DC
  Administered 2023-11-20 – 2023-11-21 (×2): 20 mg via ORAL
  Filled 2023-11-20 (×2): qty 1

## 2023-11-20 MED ORDER — POTASSIUM CHLORIDE CRYS ER 20 MEQ PO TBCR
40.0000 meq | EXTENDED_RELEASE_TABLET | Freq: Once | ORAL | Status: AC
Start: 2023-11-20 — End: 2023-11-20
  Administered 2023-11-20: 40 meq via ORAL
  Filled 2023-11-20: qty 2

## 2023-11-20 MED ORDER — ONDANSETRON HCL 4 MG/2ML IJ SOLN: 4.0000 mg | Freq: Four times a day (QID) | INTRAMUSCULAR | Status: DC | PRN

## 2023-11-20 MED ORDER — METOPROLOL SUCCINATE ER 25 MG PO TB24
25.0000 mg | ORAL_TABLET | Freq: Every day | ORAL | Status: DC
  Administered 2023-11-21: 25 mg via ORAL
  Filled 2023-11-20: qty 1

## 2023-11-20 NOTE — ED Triage Notes (Addendum)
 In for eval of fall due to syncopal episode while returning from the restroom today at approx 0200. Reports chest pain and has small bruise to left eye lid. Takes eliquis  for A-fib. Headache. Denies blurred vision. Bruising noted to right breast and left forearm. Had an episode of dizziness when arriving to room.

## 2023-11-20 NOTE — Progress Notes (Addendum)
 Patient specifically requesting for oxycodone .  Changing tramadol  to oxycodone  as needed for pain management. Also patient is requesting for sleep medications.  Ordered melatonin as needed.

## 2023-11-20 NOTE — ED Notes (Signed)
 CCMD contacted to admit the patient for cardiac monitoring.

## 2023-11-20 NOTE — Consult Note (Signed)
 CARDIOLOGY CONSULT NOTE       Patient ID: [name] MRN: [id number] DOB/AGE: [birth year] 83 y.o.  Admit date: 11/20/2023 Referring Physician: Duard Getting Primary Physician: Perley Bradley, MD Primary Cardiologist: Rodolfo Clan Reason for Consultation: Syncope/Afib  Principal Problem:   Syncope and collapse   HPI:  83 y.o. with history of PAF on flecainide  and eliquis . Also emphysema, ILD, GERD, low thyroid , [name]. She has been on flecainide  since 2003. No history of CAD. She usually can tell when she is in afib. Lives with one daughter. A week ago was at a senior prom and had "syncope" while dancing. At [employer]. No prodrome Was checked out by EMS and friend drove her home. Sunday she got up at 2:00 am went to bathroom and felt dizzy and past out with trauma to chest and right side. Now hard to take a deep breath. In ER noted to be in afib with relatively rapid rate  Xrays negative for fractures in ribs/neck. CTA negative for large vessel arterial dx to neck. Telemetry with no long pauses afib rate 100 bpm. No VT/NSVT. She had no antecedent palpitations, chest pain or dyspnea   ROS All other systems reviewed and negative except as noted above  Past Medical History:  Diagnosis Date   Atrial fibrillation (HCC)    a. Flecainide  therapy;  b. event monitor 4/12   Chest pain    a. GXT myoview  4/12: no isch., EF 86%;   b. 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 Isidor Marek   History of syncope    Hypothyroidism    Osteoporosis     Family History  Problem Relation Age of Onset   Stroke Mother    Heart disease Father 70   Hemochromatosis Brother    Breast cancer Neg Hx     Social History   Socioeconomic History   Marital status: Married    Spouse name: Not on file   Number of children: Not on file   Years of education: Not on file    Highest education level: Not on file  Occupational History   Occupation: RETIRED    Employer: RETIRED   Occupation: retired  Tobacco Use   Smoking status: Former    Current packs/day: 0.00    Average packs/day: 1 pack/day for 42.0 years (42.0 ttl pk-yrs)    Types: Cigarettes    Start date: 06/28/1955    Quit date: 06/27/1997    Years since quitting: 26.4    Passive exposure: Never   Smokeless tobacco: Never  Vaping Use   Vaping status: Never Used  Substance and Sexual Activity   Alcohol  use: Yes    Comment: occ   Drug use: No   Sexual activity: Not on file  Other Topics Concern   Not on file  Social History Narrative   REGULAR EXERCISE   Social Drivers of Health   Financial Resource Strain: Low Risk  (02/17/2022)   [facility]   Financial Resource Strain    In the past 12 months, have you experienced difficulty paying for basic needs like housing, utilities, food, transportation, or clothing: Not on file    Are there any financial concerns that limit you from seeing the doctor?: Not on file  Food Insecurity: Low Risk  (01/06/2023)   Received from [facility]  Hunger Vital Sign    Worried About Running Out of Food in the Last Year: Never true    Ran Out of Food in the Last Year: Never true  Transportation Needs: Not on file (01/06/2023)  Physical Activity: Not on file  Stress: Not on file  Social Connections: Low Risk  (02/17/2022)   [facility]   Social Connections    How often do you feel that you lack companionship?: Not on file    How often do you feel left out?: Not on file    How often to you feel isolated from others?: Not on file  Intimate Partner Violence: Not At Risk (06/20/2022)   Humiliation, Afraid, Rape, and Kick questionnaire    Fear of Current or Ex-Partner: No    Emotionally Abused: No    Physically Abused: No    Sexually Abused: No    Past Surgical History:  Procedure  Laterality Date   IR KYPHO LUMBAR INC FX REDUCE BONE BX UNI/BIL CANNULATION INC/IMAGING  06/30/2022   IR KYPHO LUMBAR INC FX REDUCE BONE BX UNI/BIL CANNULATION INC/IMAGING  08/16/2022   MOUTH SURGERY     SHOULDER ARTHROSCOPY Left    TOTAL ABDOMINAL HYSTERECTOMY     VARICOSE VEIN SURGERY        Current Facility-Administered Medications:    acetaminophen  (TYLENOL ) tablet 650 mg, 650 mg, Oral, Q6H PRN, Verlyn Goad, MD   levalbuterol (XOPENEX) nebulizer solution 0.63 mg, 0.63 mg, Nebulization, Q8H PRN, Verlyn Goad, MD   traMADol  (ULTRAM ) tablet 50 mg, 50 mg, Oral, Q6H PRN, Verlyn Goad, MD  Current Outpatient Medications:    acetaminophen  (TYLENOL ) 325 MG tablet, Take 650 mg by mouth every 6 (six) hours as needed for mild pain or headache., Disp: , Rfl:    apixaban  (ELIQUIS ) 5 MG TABS tablet, TAKE 1 TABLET BY MOUTH TWICE DAILY, Disp: 60 tablet, Rfl: 5   atorvastatin  (LIPITOR) 10 MG tablet, Take 10 mg by mouth daily., Disp: , Rfl:    cetirizine (ZYRTEC) 10 MG tablet, Take 10 mg by mouth daily., Disp: , Rfl:    cyanocobalamin (VITAMIN B12) 500 MCG tablet, Take 500 mcg by mouth daily., Disp: , Rfl:    flecainide  (TAMBOCOR ) 100 MG tablet, TAKE 1 TABLET(100 MG) BY MOUTH TWICE DAILY, Disp: 180 tablet, Rfl: 3   folic acid  (FOLVITE ) 1 MG tablet, Take 1 mg by mouth daily., Disp: , Rfl:    levothyroxine  (SYNTHROID , LEVOTHROID) 112 MCG tablet, Take 112 mcg by mouth daily., Disp: , Rfl:    methocarbamol  (ROBAXIN ) 500 MG tablet, Take 1 tablet (500 mg total) by mouth daily as needed for muscle spasms., Disp: 30 tablet, Rfl: 0   metoprolol  succinate (TOPROL -XL) 25 MG 24 hr tablet, TAKE 1 TABLET BY MOUTH  DAILY (Patient taking differently: Take 25 mg by mouth daily.), Disp: 90 tablet, Rfl: 2   Multiple Vitamins-Minerals (PRESERVISION AREDS 2) CAPS, Take 1 capsule by mouth in the morning and at bedtime., Disp: , Rfl:    mycophenolate  (MYFORTIC ) 360 MG TBEC EC tablet, Take 1 tablet (360 mg total) by  mouth 2 (two) times daily., Disp: 180 tablet, Rfl: 3   ondansetron  (ZOFRAN ) 4 MG tablet, Take 1 tablet (4 mg total) by mouth every 8 (eight) hours as needed for nausea or vomiting. (Patient taking differently: Take 4 mg by mouth as needed for nausea or vomiting.), Disp: 90 tablet, Rfl: 3   pantoprazole  (PROTONIX ) 20 MG tablet, Take 20 mg by  mouth 2 (two) times daily., Disp: , Rfl:    Polyethyl Glycol-Propyl Glycol (SYSTANE) 0.4-0.3 % SOLN, 1 drop in each eye Ophthalmic as needed, Disp: , Rfl:    predniSONE  (DELTASONE ) 5 MG tablet, Take 1 tablet (5 mg total) by mouth daily with breakfast., Disp: 90 tablet, Rfl: 1   SPIRIVA  RESPIMAT 2.5 MCG/ACT AERS, INHALE 2 PUFFS INTO THE LUNGS DAILY, Disp: 4 g, Rfl: 11   sulfamethoxazole -trimethoprim  (BACTRIM  DS) 800-160 MG tablet, Take 1 tablet by mouth 3 (three) times a week. TAKE 1 TABLET BY MOUTH ON MONDAY, WEDNESDAY, FRIDAY, Disp: 90 tablet, Rfl: 3   Vitamin D , Cholecalciferol , 25 MCG (1000 UT) TABS, Take 1 tablet by mouth daily., Disp: , Rfl:     Physical Exam: Blood pressure 136/85, pulse 97, temperature 97.9 F (36.6 C), temperature source Oral, resp. rate 16, height 5' 3.5" (1.613 m), weight 60.6 kg, SpO2 98%.   Affect appropriate [place] accent  Healthy:  appears stated age HEENT: normal Neck supple with no adenopathy JVP normal no bruits no thyromegaly Lungs clear with no wheezing and good diaphragmatic motion Heart:  S1/S2 no murmur, no rub, gallop or click PMI normal Bruising over right ribs and chest  Abdomen: benighn, BS positve, no tenderness, no AAA no bruit.  No HSM or HJR Distal pulses intact with no bruits No edema Neuro non-focal Skin warm and dry No muscular weakness   Labs:   Lab Results  Component Value Date   WBC 7.5 11/20/2023   HGB 13.5 11/20/2023   HCT 40.1 11/20/2023   MCV 105.8 (H) 11/20/2023   PLT 213 11/20/2023    Recent Labs  Lab 11/20/23 1317  NA 140  K 3.4*  CL 102  CO2 25  BUN 9  CREATININE 0.72   CALCIUM  9.9  PROT 6.5  BILITOT 0.4  ALKPHOS 85  ALT 21  AST 20  GLUCOSE 111*   Lab Results  Component Value Date   CKTOTAL 50 08/29/2019   TROPONINI <0.03 09/22/2018   No results found for: "CHOL" No results found for: "HDL" No results found for: "LDLCALC" No results found for: "TRIG" No results found for: "CHOLHDL" No results found for: "LDLDIRECT"    Radiology: CT C-SPINE NO CHARGE Result Date: 11/20/2023 CLINICAL DATA:  Marvell Slider, syncope, anticoagulated EXAM: CT CERVICAL SPINE WITHOUT CONTRAST TECHNIQUE: Multidetector CT imaging of the cervical spine was performed without intravenous contrast. Multiplanar CT image reconstructions were also generated. RADIATION DOSE REDUCTION: This exam was performed according to the departmental dose-optimization program which includes automated exposure control, adjustment of the mA and/or kV according to patient size and/or use of iterative reconstruction technique. COMPARISON:  06/25/2023 FINDINGS: Alignment: Alignment is grossly anatomic. Skull base and vertebrae: No acute fracture. No primary bone lesion or focal pathologic process. Soft tissues and spinal canal: No prevertebral fluid or swelling. No visible canal hematoma. Please refer to separate CT angiography neck report for description of vascular findings. Disc levels: Significant left predominant facet hypertrophy from C2-3 through C6-7. Mild multilevel spondylosis greatest at C5-6 and C6-7. No significant change from previous study. Upper chest: Airway is patent. Emphysematous changes are seen at the lung apices. Other: Reconstructed images demonstrate no additional findings. IMPRESSION: 1. No acute cervical spine fracture. 2. Stable multilevel cervical degenerative changes. 3. Please refer to separate CT angiography neck report for description of vascular findings. Electronically Signed   By: Bobbye Burrow M.D.   On: 11/20/2023 17:01   CT ANGIO HEAD NECK W WO CM Result  Date: 11/20/2023 EXAM:  CTA HEAD AND NECK WITH AND WITHOUT 11/20/2023 04:42:56 PM TECHNIQUE: CTA of the head and neck was performed with and without the administration of intravenous contrast. Multiplanar 2D and/or 3D reformatted images are provided for review. Automated exposure control, iterative reconstruction, and/or weight based adjustment of the mA/kV was utilized to reduce the radiation dose to as low as reasonably achievable. Stenosis of the internal carotid arteries measured using NASCET criteria. COMPARISON: CT head without contrast 11/07/2018 CLINICAL HISTORY: Syncope episode and fall. Patient takes alloques. FINDINGS: CTA NECK: AORTIC ARCH AND ARCH VESSELS: Atherosclerotic changes are present in the distal aortic arch and left subclavian artery without significant stenosis or aneurysm. No dissection is present. CAROTID ARTERIES: Mild atherosclerotic changes are present at the carotid bifurcations bilaterally without significant stenosis. VERTEBRAL ARTERIES: The right vertebral artery is dominant. SOFT TISSUES: Diffuse ground-glass attenuation is present at the lung apices bilaterally. Central pulmonary emphysematous changes are present. BONES: Multilevel degenerative changes in the cervical spine are worse left than right. No focal osseous lesions are present. CTA HEAD: ANTERIOR CIRCULATION: No significant stenosis of the internal carotid arteries through the ICA termini. Minimal atherosclerotic calcifications are present within the cavernous internal carotid arteries bilaterally. POSTERIOR CIRCULATION: The right posterior cerebral artery is of fetal type. OTHER: No dural venous sinus thrombosis on this non-dedicated study. BRAIN: Moderate periventricular lucency and subcortical white matter hypoattenuation is present bilaterally, stable. There is no acute infarct or acute intracranial hemorrhage. No mass effect or midline shift. IMPRESSION: 1. No large vessel occlusion in the head or neck. 2. Mild atherosclerotic changes at  the carotid bifurcations and in the cavernous internal carotid arteries bilaterally without significant stenosis. 3. Aortic atherosclerosis. 4. Stable atrophy and advanced white matter disease. Electronically signed by: Audree Leas MD 11/20/2023 05:00 PM EDT RP Workstation: [id number]   CT CHEST ABDOMEN PELVIS W CONTRAST Result Date: 11/20/2023 CLINICAL DATA:  Fall. Blunt trauma. Chest and abdominal pain. On Eliquis . EXAM: CT CHEST, ABDOMEN, AND PELVIS WITH CONTRAST TECHNIQUE: Multidetector CT imaging of the chest, abdomen and pelvis was performed following the standard protocol during bolus administration of intravenous contrast. RADIATION DOSE REDUCTION: This exam was performed according to the departmental dose-optimization program which includes automated exposure control, adjustment of the mA and/or kV according to patient size and/or use of iterative reconstruction technique. CONTRAST:  75mL OMNIPAQUE  IOHEXOL  350 MG/ML SOLN COMPARISON:  06/20/2022 FINDINGS: CT CHEST FINDINGS Cardiovascular: No evidence of thoracic aortic injury or mediastinal hematoma. No pericardial effusion. Mediastinum/Nodes: No evidence of hemorrhage or pneumomediastinum. No masses or pathologically enlarged lymph nodes identified. Lungs/Pleura: No evidence of pulmonary contusion or other infiltrate. No evidence of pneumothorax or hemothorax. Mild-to-moderate centrilobular emphysema again noted. Musculoskeletal: No acute fractures or suspicious bone lesions identified. CT ABDOMEN PELVIS FINDINGS Hepatobiliary: No hepatic laceration identified. 1.5 cm indeterminate low-attenuation lesion in the liver dome, not visualized on previous exam. Gallbladder is unremarkable. No evidence of biliary ductal dilatation. Pancreas: No parenchymal laceration, mass, or inflammatory changes identified. Spleen: No evidence of splenic laceration. Adrenal/Urinary Tract: No hemorrhage or parenchymal lacerations identified. No evidence of suspicious  masses or hydronephrosis. Stomach/Bowel: Tiny hiatal hernia noted. Diverticulosis is seen mainly involving the sigmoid colon, however there is no evidence of diverticulitis. Unopacified bowel loops are otherwise unremarkable in appearance. No evidence of hemoperitoneum. Vascular/Lymphatic: No evidence of abdominal aortic injury or retroperitoneal hemorrhage. No pathologically enlarged lymph nodes identified. Reproductive: Prior hysterectomy noted. Adnexal regions are unremarkable in appearance. Other:  None. Musculoskeletal: No acute fractures  or suspicious bone lesions identified. Prior L1 and L2 vertebroplasties noted. IMPRESSION: No evidence of traumatic injury or other acute findings. 1.5 cm indeterminate low-attenuation lesion in liver dome. Recommend abdomen MRI without and with contrast for further characterization. Tiny hiatal hernia. Colonic diverticulosis, without radiographic evidence of diverticulitis. Electronically Signed   By: Marlyce Sine M.D.   On: 11/20/2023 16:53   DG Ribs Unilateral W/Chest Right Result Date: 11/20/2023 CLINICAL DATA:  Chest pain.  Fall. EXAM: RIGHT RIBS AND CHEST - 3+ VIEW COMPARISON:  Rib radiograph 06/10/2020 FINDINGS: No displaced rib fracture. The bones are subjectively under mineralized which could obscure subtle nondisplaced fractures. There is no pneumothorax or pleural effusion. Both lungs are clear. The heart is normal in size, mediastinal contours are normal. Aortic atherosclerosis. Vertebral augmentation in the upper lumbar spine/thoracolumbar junction. IMPRESSION: No displaced rib fracture or pulmonary complication. Electronically Signed   By: Chadwick Colonel M.D.   On: 11/20/2023 14:06   DG Shoulder Left Result Date: 11/20/2023 CLINICAL DATA:  Pain after fall.  Syncope EXAM: LEFT SHOULDER - 3 VIEW COMPARISON:  None Available. FINDINGS: No fracture or dislocation. Preserved joint spaces. Osteopenia. Artifact from the patient's cardiac leads. IMPRESSION: No  acute osseous abnormality. Electronically Signed   By: Adrianna Horde M.D.   On: 11/20/2023 14:02   CT HEAD WO CONTRAST ( ) Result Date: 11/07/2023 CLINICAL DATA:  Provided history: Concussion with brief loss of consciousness. Additional history provided: Recent fall (with head trauma). Headaches. Posterior head pain. EXAM: CT HEAD WITHOUT CONTRAST TECHNIQUE: Contiguous axial images were obtained from the base of the skull through the vertex without intravenous contrast. RADIATION DOSE REDUCTION: This exam was performed according to the departmental dose-optimization program which includes automated exposure control, adjustment of the mA and/or kV according to patient size and/or use of iterative reconstruction technique. COMPARISON:  Head CT 06/25/2023. FINDINGS: Brain: Generalized cerebral atrophy. Patchy and ill-defined hypoattenuation within the cerebral white matter, nonspecific but compatible with mild chronic small vessel ischemic disease. There is no acute intracranial hemorrhage. No demarcated cortical infarct. No extra-axial fluid collection. No evidence of an intracranial mass. No midline shift. Vascular: No hyperdense vessel.  Atherosclerotic calcifications. Skull: No calvarial fracture or aggressive osseous lesion. Sinuses/Orbits: No mass or acute finding within the imaged orbits. No significant paranasal sinus disease at the imaged levels. IMPRESSION: 1.  No evidence of an acute intracranial abnormality. 2. Parenchymal atrophy and chronic small vessel ischemic disease. Electronically Signed   By: Bascom Lily D.O.   On: 11/07/2023 13:54    EKG: AFib rate 119 normal QT/QRS   ASSESSMENT AND PLAN:   Syncope:  etiology not clear. Usually with normal LV function afib should not cause syncope. No evidence of flecainide  pro arrhythmia with normal QRS/QT at relatively fast rate. EF normal on TTE 07/13/22. No history of CAD. Would update TTE. Continue eliquis  and consider DCC in 48 hours after pain  from fall improves. Increase Toprol  to 50 mg daily for rate control Watch on telemetry for NSVT/post conversion pauses.  Troponin negative. Supplement K 3.4  Hypothyroid:  TSH normal 10/21/22 check in hospital  Emphysema:  former smoker quit 1999 no active wheezing Spiriva  /Xopenex HLD:  continue statin   Will likely need 30 day monitor on d/c or can consider ILR  Signed: Janelle Mediate 11/20/2023, 6:26 PM

## 2023-11-20 NOTE — ED Provider Notes (Addendum)
 [facility] EMERGENCY DEPARTMENT AT [facility] Provider Note   CSN: [id number] Arrival date & time: 11/20/23  1223     History  Chief Complaint  Patient presents with   [name] is a 83 y.o. female.   Fall   83 year old female presents to the emergency department with complaints of fall.  States that around 2 AM on Sunday morning, had a fall when she was walking back to her bed from using the restroom.  States that she washed her hands as well as her face and began to feel lightheaded.  States that she subsequently fell forward hitting her chest as well as face on the ground.  States that she lost consciousness for a matter a few seconds before she was helped back into bed by her daughter as well as granddaughter.  Since then, has had chest pain as well as slight headache.  States that she had an additional fall around a week ago.  Was at a senior prom and was dancing when she felt abrupt onset lightheadedness, numbness to the left side of her face and subsequently fell backwards.  States that she lost consciousness for a few seconds before she was helped back up.  Had CT scan by outpatient provider which was negative for any abnormality from a traumatic injury.  Patient states that since the initial fall in a week ago, has felt "unsteady on my feet" as well as lightheaded.  States this typically happens when she is walking.  Denies room spinning type sensation.  Denies any shortness of breath, abdominal pain, nausea, vomiting.  Denies any visual disturbance, slurred speech, facial droop, weakness/sensory deficits unilaterally in upper or lower extremities.  Past medical history significant for atrial fibrillation on Eliquis , GERD, hypothyroidism, hemochromatosis, pulmonary edema, ILD  Home Medications Prior to Admission medications   Medication Sig Start Date End Date Taking? Authorizing Provider  acetaminophen  (TYLENOL ) 325 MG tablet Take 650 mg by mouth every 6  (six) hours as needed for mild pain or headache.    [provider]  apixaban  (ELIQUIS ) 5 MG TABS tablet TAKE 1 TABLET BY MOUTH TWICE DAILY 05/05/23   Verona Goodwill, MD  atorvastatin  (LIPITOR) 10 MG tablet Take 10 mg by mouth daily. 06/18/19   [provider]  cyanocobalamin  (VITAMIN B12) 500 MCG tablet Take 500 mcg by mouth daily.    [provider]  flecainide  (TAMBOCOR ) 100 MG tablet TAKE 1 TABLET(100 MG) BY MOUTH TWICE DAILY 03/21/23   Ursuy, Renee Lynn, PA-C  folic acid  (FOLVITE ) 1 MG tablet Take 1 mg by mouth daily.    [provider]  HYDROcodone -acetaminophen  (NORCO/VICODIN) 5-325 MG tablet Take 1 tablet by mouth every 6 (six) hours as needed. 06/25/23   Carin Charleston, MD  levothyroxine  (SYNTHROID , LEVOTHROID) 112 MCG tablet Take 112 mcg by mouth daily.    [provider]  methocarbamol  (ROBAXIN ) 500 MG tablet Take 1 tablet (500 mg total) by mouth daily as needed for muscle spasms. 11/16/23   Romayne Clubs, PA-C  metoprolol  succinate (TOPROL -XL) 25 MG 24 hr tablet TAKE 1 TABLET BY MOUTH  DAILY Patient taking differently: Take 25 mg by mouth daily. 05/29/17   Verona Goodwill, MD  mycophenolate  (MYFORTIC ) 360 MG TBEC EC tablet Take 1 tablet (360 mg total) by mouth 2 (two) times daily. 08/03/23   Maire Scot, MD  Na Sulfate-K Sulfate-Mg Sulfate concentrate (SUPREP BOWEL PREP KIT) 17.5-3.13-1.6 GM/177ML SOLN Take 1 kit (354  mLs total) by mouth as directed. 11/13/23   Cirigliano, Vito V, DO  ondansetron  (ZOFRAN ) 4 MG tablet Take 1 tablet (4 mg total) by mouth every 8 (eight) hours as needed for nausea or vomiting. Patient taking differently: Take 4 mg by mouth as needed for nausea or vomiting. 10/03/23   Maire Scot, MD  pantoprazole  (PROTONIX ) 20 MG tablet Take 20 mg by mouth 2 (two) times daily. 11/10/21   [provider]  Polyethyl Glycol-Propyl Glycol (SYSTANE) 0.4-0.3 % SOLN 1 drop in each eye Ophthalmic as needed    [provider]  predniSONE  (DELTASONE ) 5 MG tablet Take 1 tablet (5 mg total) by mouth daily with breakfast. 10/03/23   Maire Scot, MD  sertraline (ZOLOFT) 25 MG tablet Take 25 mg by mouth daily.    [provider]  SPIRIVA  RESPIMAT 2.5 MCG/ACT AERS INHALE 2 PUFFS INTO THE LUNGS DAILY 02/23/23   Maire Scot, MD  sulfamethoxazole -trimethoprim  (BACTRIM  DS) 800-160 MG tablet Take 1 tablet by mouth 3 (three) times a week. TAKE 1 TABLET BY MOUTH ON MONDAY, WEDNESDAY, FRIDAY 01/27/23   Maire Scot, MD  Vitamin D , Cholecalciferol , 25 MCG (1000 UT) TABS Take 1 tablet by mouth daily.    [provider]      Allergies    Patient has no known allergies.    Review of Systems   Review of Systems  All other systems reviewed and are negative.   Physical Exam Updated Vital Signs BP 135/71 (BP Location: Right Arm)   Pulse (!) 103   Temp (!) 97.5 F (36.4 C) (Oral)   Resp 16   Ht 5' 3.5" (1.613 m)   Wt 60.6 kg   SpO2 98%   BMI 23.30 kg/m  Physical Exam Vitals and nursing note reviewed.  Constitutional:      General: She is not in acute distress.    Appearance: She is well-developed.  HENT:     Head: Normocephalic.     Comments: Small area of ecchymosis superior orbital rim on the left side with surrounding swelling.  EOMs intact without pain. Eyes:     Conjunctiva/sclera: Conjunctivae normal.  Cardiovascular:     Rate and Rhythm: Normal rate and regular rhythm.  Pulmonary:     Effort: Pulmonary effort is normal. No respiratory distress.     Breath sounds: Normal breath sounds.  Abdominal:     Palpations: Abdomen is soft.     Tenderness: There is no abdominal tenderness.  Musculoskeletal:        General: No swelling.     Cervical back: Neck supple.     Comments: No midline tenderness cervical, thoracic, lumbar spine without step-off or deformity.  Patient with midline chest wall tenderness as well as tenderness beneath the right breast.  Overlying  ecchymosis of right breast.  Patient with tenderness left posterior humerus proximally otherwise, no tenderness of upper or lower extremities.  Skin:    General: Skin is warm and dry.     Capillary Refill: Capillary refill takes less than 2 seconds.  Neurological:     Mental Status: She is alert.     Comments: Alert and oriented to self, place, time and event.   Speech is fluent, clear without dysarthria or dysphasia.   Strength symmetric in upper/lower extremities   Sensation intact in upper/lower extremities   Patient stood up from the bed and began to feel lightheaded and is not sure when he passed out.  Stood for 20 seconds or so  and was able to walk when symptoms again continued.  States that she felt "unsteady on my feet" CN I not tested  CN II not tested CN III, IV, VI PERRLA and EOMs intact bilaterally  CN V Intact sensation to sharp and light touch to the face  CN VII facial movements symmetric  CN VIII not tested  CN IX, X no uvula deviation, symmetric rise of soft palate  CN XI symmetric SCM and trapezius strength bilaterally  CN XII Midline tongue protrusion, symmetric L/R movements     Psychiatric:        Mood and Affect: Mood normal.     ED Results / Procedures / Treatments   Labs (all labs ordered are listed, but only abnormal results are displayed) Labs Reviewed  COMPREHENSIVE METABOLIC PANEL WITH GFR  CBC  URINALYSIS, ROUTINE W REFLEX MICROSCOPIC  TROPONIN T, HIGH SENSITIVITY    EKG None  Radiology No results found.  Procedures Procedures    Medications Ordered in ED Medications - No data to display  ED Course/ Medical Decision Making/ A&P Clinical Course as of 11/20/23 1540  Mon Nov 20, 2023  1421 While waiting for setting up POV transfer, patient's heart rate went into the 160s with A-fib present.  Given 5 mg of metoprolol  with subsequent improvement.  Will continue to monitor patient with plan for ED transfer once proved stable. [CR]  1520  Transfer from sister ED for syncope.  Was sent here due to CT scanner nonfunctioning.  We will follow-up on CT imaging and laboratory studies. [WC]    Clinical Course User Index [CR] Pecos Butter, PA [WC] Arminda Landmark, MD                                 Medical Decision Making Amount and/or Complexity of Data Reviewed Labs: ordered. Radiology: ordered.  Risk Prescription drug management. Decision regarding hospitalization.   This patient presents to the ED for concern of syncope, this involves an extensive number of treatment options, and is a complaint that carries with it a high risk of complications and morbidity.  The differential diagnosis includes vasovagal, orthostatic, cardiogenic, medication side effect, CVA, dehydration, arrhythmia, ACS, PE, other   Co morbidities that complicate the patient evaluation  See HPI   Additional history obtained:  Additional history obtained from EMR External records from outside source obtained and reviewed including hospital records   Lab Tests:  I Ordered, and personally interpreted labs.  The pertinent results include: No leukocytosis.  No evidence of anemia.  Bili is within range.  Mild hypokalemia of 3.4 otherwise, lites within limits.  No transaminitis.  No renal dysfunction.  Troponin of less than 15.   Imaging Studies ordered:  I ordered imaging studies including CT head/maxillofacial/cervical spine, chest x-ray with right ribs, left shoulder x-ray I independently visualized and interpreted imaging which showed  CT head/maxillofacial/cervical spine: Pending Chest x-ray with right ribs: No acute abnormality Left shoulder x-ray: No acute osseous abnormality I agree with the radiologist interpretation   Cardiac Monitoring: / EKG:  The patient was maintained on a cardiac monitor.  I personally viewed and interpreted the cardiac monitored which showed an underlying rhythm of: Atrial fibrillation with rate of  119   Consultations Obtained:  I requested consultation with attending Dr. Florie Husband who is reviewing imaging plan going forward  Problem List / ED Course / Critical interventions / Medication management  Syncope I  ordered medication including normal saline  Reevaluation of the patient after these medicines showed that the patient improved I have reviewed the patients home medicines and have made adjustments as needed   Social Determinants of Health:  Former cigarette use.  Denies illicit drug use.   Test / Admission - Considered:  Syncope Vitals signs within normal range and stable throughout visit. Laboratory/imaging studies significant for: See above 83 year old female presents to the emergency department with complaints of fall.  States that around 2 AM on Sunday morning, had a fall when she was walking back to her bed from using the restroom.  States that she washed her hands as well as her face and began to feel lightheaded.  States that she subsequently fell forward hitting her chest as well as face on the ground.  States that she lost consciousness for a matter a few seconds before she was helped back into bed by her daughter as well as granddaughter.  Since then, has had chest pain as well as slight headache.  States that she had an additional fall around a week ago.  Was at a senior prom and was dancing when she felt abrupt onset lightheadedness, numbness to the left side of her face and subsequently fell backwards.  States that she lost consciousness for a few seconds before she was helped back up.  Had CT scan by outpatient provider which was negative for any abnormality from a traumatic injury.  Patient states that since the initial fall in a week ago, has felt "unsteady on my feet" as well as lightheaded.  States this typically happens when she is walking.  Denies room spinning type sensation.  Denies any shortness of breath, abdominal pain, nausea, vomiting.  Denies any visual  disturbance, slurred speech, facial droop, weakness/sensory deficits unilaterally in upper or lower extremities.  On exam, patient with appreciable bruising right anterior chest wall, left superior eyelid with overlying reproducible tenderness.  Patient with nonfocal cranial nerve III through XII but with difficulty ambulating as she felt lightheaded upon standing and felt "wobbly" although able to ambulate independently.  Unable to obtain CT imaging at at this facility given patient's head injury on anticoagulation, CT imaging of patient's head, cervical spine maxillofacial ordered and pending.  X-rays obtained here did not show any acute abnormality from traumatic incident.  Labs pending.  Orthostatics were performed by nursing staff which were positive which could be attributing to patient's syncopal episodes; syncopal incident around a week ago did not seem to be orthostatically mediated as she was already standing and was dancing when symptoms began.  I suspect patient will benefit from admission for further workup regarding syncope once cleared from a trauma perspective given that she is 45, said 2 episodes within 1 week and is currently anticoagulated.  Patient may also benefit from MR imaging pending response to IV fluids given her continued described "wobbly gait."  Patient may also benefit from CT scan of the chest given evidence of hematoma right chest wall mother chest x-ray reassuring.  Patient to be transferred to [facility] for more emergent imaging CT scan. 1421 While waiting for setting of POV transfer, patient's heart rate went into the 160s with A-fib being predominant rhythm.  Given 5 mg of metoprolol  with improvement.  Patient monitored for 20 minutes or so after medicine administered with normalization of heart rate.  Patient is deemed stable for transfer.  Patient symptoms could include atrial fibrillation with rhythm not adequately controlled as well.  Final Clinical  Impression(s) / ED Diagnoses Final diagnoses:  None    Rx / DC Orders ED Discharge Orders     None            Butter, [place] 11/20/23 1821    Afton Horse T, DO 11/25/23 1533

## 2023-11-20 NOTE — ED Notes (Signed)
 MD at bedside.

## 2023-11-20 NOTE — ED Provider Notes (Signed)
 Assume Care - Medical Decision Making  Care of patient assumed from previous emergency medicine provider. See their note for further details of history, physical exam and plan.  Briefly, [name] is a 83 y.o. female who presents as below:  Clinical Course as of 11/20/23 1521  Mon Nov 20, 2023  1421 While waiting for setting up POV transfer, patient's heart rate went into the 160s with A-fib present.  Given 5 mg of metoprolol  with subsequent improvement.  Will continue to monitor patient with plan for ED transfer once proved stable. [CR]  1520 Transfer from sister ED for syncope.  Was sent here due to CT scanner nonfunctioning.  We will follow-up on CT imaging and laboratory studies. [WC]    Clinical Course User Index [CR] Bushnell Butter, PA [WC] Arminda Landmark, MD   Patient was evaluated at [facility].  Patient states that she fell Sunday morning around 2 AM.  She states that since that time she has had right anterior, lateral chest pain.  She is also reported bruising.  She states that she also has pain when taking a deep breath.  She also has some mild right upper quadrant tenderness.  States she has been taking her Eliquis .  Does have a history of atrial fibrillation and has been on a stable dose of metoprolol  XL for the past 20 years.  She states that when she fell she remembers her eyes becoming crossed and then the next thing she remembers is being on the ground.  She reports that she had a event like this 2 weeks ago where she was at a dance recital and noticed that her right face was becoming tingling and next and she remembers is being on the ground.  While at [facility] patient did develop atrial fibrillation RVR.  She states that she then began to have that tingling sensation in her face.  She did not syncopized.  Patient was given 5 mg of IV metoprolol  which did achieve rate control.  Reassessment:  I personally reassessed the patient: Vital Signs:  The most  current vitals were:  Vitals:   11/20/23 1236 11/20/23 1403  BP: 135/71   Pulse: (!) 103 93  Resp: 16 15  Temp: (!) 97.5 F (36.4 C)   SpO2: 98% 96%     Hemodynamics:  The patient is hemodynamically stable. Mental Status:  The patient is alert Chest wall: Patient does have bruising and tender to palpation over the right anterior and lateral ribs Abdominal: Patient's abdomen is soft and nontender however does have some tenderness in the right upper quadrant   Additional MDM/ED Course: Given the patient does have bruising to the anterior chest wall as well as some right upper quadrant tenderness on my examination we we will perform CT imaging of the chest, abdomen and pelvis.  I am concerned for rib fractures and the morbidity and mortality associated with these in this population  CTA of the head and neck was performed to evaluate the arteries.  There is no significant stenosis.  Patient remains in atrial fibrillation however rate controlled.  Troponin is reassuring.  Creatinine reassuring.  Traumatic imaging negative.  No rib fractures or pneumothorax.  No liver lacerations.  Likely musculoskeletal bruising.  Given patient's syncope x 2 as well as having atrial fibrillation and return of symptoms when she did experience RVR I do feel the patient requires admission to the hospital for observation.  No further emergent interventions.   Arminda Landmark, MD 11/20/23  1610    Deatra Face, MD 11/20/23 2131

## 2023-11-20 NOTE — Discharge Instructions (Signed)
   Please discuss the below finding with you primary care doctor: 1.5 cm indeterminate low-attenuation lesion in liver dome. Recommend abdomen MRI without and with contrast for further characterization.

## 2023-11-20 NOTE — H&P (Addendum)
 History and Physical    Patient: [name] WGN:[id number] DOB: Aug 09, 1940 DOA: 11/20/2023 DOS: the patient was seen and examined on 11/20/2023 PCP: Perley Bradley, MD  Patient coming from: Home  Chief Complaint:  Chief Complaint  Patient presents with   Fall   HPI: [name] is a 83 y.o. female with medical history significant of paroxysmal atrial fibrillation followed by electrophysiology, hemochromatosis, iron deficiency anemia, depression, anxiety, GERD, hypothyroidism. Patient presented secondary to worsening chest pain and lightheadedness secondary to fall with trauma that occurred on Sunday morning.  Patient reports that around 2 AM Sunday morning, patient got up to go to the restroom.  Upon returning back to her bedroom, she remembers being upright followed by remembering being on the ground.  She reports no prodrome.  EMS was called at that time with recommendations for transfer to the emergency department for x-ray and CT to evaluate for fractures or bleeding; this was declined by the patient at that time.  Patient managed her pain with Tylenol  with good effect.  Patient reports a similar episode of syncope that occurred about 1 week prior when she was at her senior prom.  She reports that there was a sensation of left facial numbness at that time.  At that time, her PCP obtained a CT of her head which was significant for no fracture or hemorrhaging.  Patient reports no palpitations, nausea, vomiting, headache. Some chronic coughing, but no acute changes. No fevers, chills, dysuria.  Review of Systems: As mentioned in the history of present illness. All other systems reviewed and are negative. Past Medical History:  Diagnosis Date   Atrial fibrillation (HCC)    a. Flecainide  therapy;  b. event monitor 4/12   Chest pain    a. GXT myoview  4/12: no isch., EF 86%;   b. echo 4/12: EF 55-65%, grade 1 diast dysfxn, LAE   Depression with anxiety    GERD (gastroesophageal reflux  disease)    Hemochromatosis    indentified by the C282Y gene mutation; Dr. Isidor Marek   History of syncope    Hypothyroidism    Osteoporosis    Past Surgical History:  Procedure Laterality Date   IR KYPHO LUMBAR INC FX REDUCE BONE BX UNI/BIL CANNULATION INC/IMAGING  06/30/2022   IR KYPHO LUMBAR INC FX REDUCE BONE BX UNI/BIL CANNULATION INC/IMAGING  08/16/2022   MOUTH SURGERY     SHOULDER ARTHROSCOPY Left    TOTAL ABDOMINAL HYSTERECTOMY     VARICOSE VEIN SURGERY     Social History:  reports that she quit smoking about 26 years ago. Her smoking use included cigarettes. She started smoking about 68 years ago. She has a 42 pack-year smoking history. She has never been exposed to tobacco smoke. She has never used smokeless tobacco. She reports current alcohol  use. She reports that she does not use drugs.  No Known Allergies  Family History  Problem Relation Age of Onset   Stroke Mother    Heart disease Father 59   Hemochromatosis Brother    Breast cancer Neg Hx     Prior to Admission medications   Medication Sig Start Date End Date Taking? Authorizing Provider  acetaminophen  (TYLENOL ) 325 MG tablet Take 650 mg by mouth every 6 (six) hours as needed for mild pain or headache.    [provider]  apixaban  (ELIQUIS ) 5 MG TABS tablet TAKE 1 TABLET BY MOUTH TWICE DAILY 05/05/23   Verona Goodwill, MD  atorvastatin  (LIPITOR) 10 MG tablet Take 10  mg by mouth daily. 06/18/19   [provider]  cyanocobalamin (VITAMIN B12) 500 MCG tablet Take 500 mcg by mouth daily.    [provider]  flecainide  (TAMBOCOR ) 100 MG tablet TAKE 1 TABLET(100 MG) BY MOUTH TWICE DAILY 03/21/23   Ursuy, Renee Lynn, PA-C  folic acid  (FOLVITE ) 1 MG tablet Take 1 mg by mouth daily.    [provider]  HYDROcodone -acetaminophen  (NORCO/VICODIN) 5-325 MG tablet Take 1 tablet by mouth every 6 (six) hours as needed. 06/25/23   Carin Charleston, MD  levothyroxine  (SYNTHROID , LEVOTHROID) 112 MCG  tablet Take 112 mcg by mouth daily.    [provider]  methocarbamol  (ROBAXIN ) 500 MG tablet Take 1 tablet (500 mg total) by mouth daily as needed for muscle spasms. 11/16/23   Romayne Clubs, PA-C  metoprolol  succinate (TOPROL -XL) 25 MG 24 hr tablet TAKE 1 TABLET BY MOUTH  DAILY Patient taking differently: Take 25 mg by mouth daily. 05/29/17   Verona Goodwill, MD  mycophenolate  (MYFORTIC ) 360 MG TBEC EC tablet Take 1 tablet (360 mg total) by mouth 2 (two) times daily. 08/03/23   Maire Scot, MD  Na Sulfate-K Sulfate-Mg Sulfate concentrate (SUPREP BOWEL PREP KIT) 17.5-3.13-1.6 GM/177ML SOLN Take 1 kit (354 mLs total) by mouth as directed. 11/13/23   Cirigliano, Vito V, DO  ondansetron  (ZOFRAN ) 4 MG tablet Take 1 tablet (4 mg total) by mouth every 8 (eight) hours as needed for nausea or vomiting. Patient taking differently: Take 4 mg by mouth as needed for nausea or vomiting. 10/03/23   Maire Scot, MD  pantoprazole  (PROTONIX ) 20 MG tablet Take 20 mg by mouth 2 (two) times daily. 11/10/21   [provider]  Polyethyl Glycol-Propyl Glycol (SYSTANE) 0.4-0.3 % SOLN 1 drop in each eye Ophthalmic as needed    [provider]  predniSONE  (DELTASONE ) 5 MG tablet Take 1 tablet (5 mg total) by mouth daily with breakfast. 10/03/23   Maire Scot, MD  sertraline (ZOLOFT) 25 MG tablet Take 25 mg by mouth daily.    [provider]  SPIRIVA  RESPIMAT 2.5 MCG/ACT AERS INHALE 2 PUFFS INTO THE LUNGS DAILY 02/23/23   Maire Scot, MD  sulfamethoxazole -trimethoprim  (BACTRIM  DS) 800-160 MG tablet Take 1 tablet by mouth 3 (three) times a week. TAKE 1 TABLET BY MOUTH ON MONDAY, WEDNESDAY, FRIDAY 01/27/23   Maire Scot, MD  Vitamin D , Cholecalciferol , 25 MCG (1000 UT) TABS Take 1 tablet by mouth daily.    [provider]    Physical Exam: BP 136/85 (BP Location: Right Arm)   Pulse 97   Temp 97.9 F (36.6 C) (Oral)   Resp 16   Ht 5' 3.5" (1.613 m)   Wt 60.6  kg   SpO2 98%   BMI 23.30 kg/m   General exam: Appears calm and comfortable and in no acute distress. Conversant Respiratory: Clear to auscultation. Respiratory effort normal with no intercostal retractions or use of accessory muscles Cardiovascular: S1 & S2 heard, RRR. No murmurs, rubs, gallops or clicks. No BLE edema Gastrointestinal: Abdomen is non-distended, soft and non-tender. No masses felt. Normal bowel sounds heard Neurologic: No focal neurological deficits Musculoskeletal: No calf tenderness. Chest wall tenderness Skin: No cyanosis. No new rashes Psychiatry: Alert and oriented x4. Memory intact. Mood & affect appropriate  Data Reviewed: There are no new results to review at this time.  Assessment and Plan:  Syncope Multiple recent episodes.  Complicated by history of atrial fibrillation and evidence of RVR.  Possible  vasovagal syncope.  CT imaging without acute process to explain syncopal episode.  No report of seizure-like activity.  EKG consistent with atrial fibrillation with RVR. - Telemetry while admitted - Echocardiogram - Orthostatic vitals  Paroxysmal atrial fibrillation with RVR Patient follows with electrophysiology, Dr. Rodolfo Clan, as an outpatient.  Patient is managed on metoprolol  succinate and flecainide .  Patient is also on Eliquis  for stroke prevention. - Cardiology consultation - Continue Metoprolol  succinate and flecainide  - Echocardiogram  Interstitial lund disease Patient is on spiriva , Myfortic , prednisone  as an outpatient. - Continue Spiriva , Myfortic , prednisone  - Xopenex as needed  Hyperlipidemia Patient is on Lipitor as an outpatient - Continue Lipitor  Iron deficiency anemia Hereditary hemochromatosis Macrocytosis Iron deficiency anemia secondary to chronic blood loss.  Patient follows with medical oncology  Osteoporosis - Continue vitamin D   GERD - Continue Protonix   Hypothyroidism Patient is managed on levothyroxine  112 mcg daily -  Continue levothyroxine  112 mcg daily  Abnormal CT finding Liver lesion noted on CT imaging.  Recommendation for MRI.  Patient will likely need to follow-up outpatient with her PCP for follow-up.    Advance Care Planning:   Code Status: Full Code  Consults: Cardiology  Family Communication: Daughter at bedside   Author: Aneita Keens, MD 11/20/2023 6:25 PM  For on call review [email or web address].

## 2023-11-20 NOTE — ED Notes (Signed)
 Patient transported to CT

## 2023-11-20 NOTE — ED Notes (Signed)
 Admitting at bedside

## 2023-11-20 NOTE — ED Notes (Signed)
 [facility] charge RN informed about patient transfer. Accept MD will be Long MD.

## 2023-11-20 NOTE — ED Notes (Signed)
MD notified of patient's increasing pain

## 2023-11-21 ENCOUNTER — Other Ambulatory Visit (HOSPITAL_COMMUNITY): Payer: Self-pay

## 2023-11-21 ENCOUNTER — Observation Stay (HOSPITAL_COMMUNITY)

## 2023-11-21 ENCOUNTER — Inpatient Hospital Stay (HOSPITAL_COMMUNITY)
Admit: 2023-11-21 | Discharge: 2023-11-21 | Disposition: A | Discharge status: Home / Self Care | Attending: Cardiology | Admitting: Cardiology

## 2023-11-21 ENCOUNTER — Other Ambulatory Visit: Payer: Self-pay

## 2023-11-21 DIAGNOSIS — I480 Paroxysmal atrial fibrillation: Secondary | ICD-10-CM

## 2023-11-21 DIAGNOSIS — I4891 Unspecified atrial fibrillation: Secondary | ICD-10-CM

## 2023-11-21 DIAGNOSIS — R55 Syncope and collapse: Secondary | ICD-10-CM

## 2023-11-21 DIAGNOSIS — J984 Other disorders of lung: Secondary | ICD-10-CM | POA: Diagnosis not present

## 2023-11-21 DIAGNOSIS — E039 Hypothyroidism, unspecified: Secondary | ICD-10-CM | POA: Diagnosis not present

## 2023-11-21 LAB — BASIC METABOLIC PANEL WITH GFR
Anion gap: 9 (ref 5–15)
BUN: 8 mg/dL (ref 8–23)
CO2: 26 mmol/L (ref 22–32)
Calcium: 9.1 mg/dL (ref 8.9–10.3)
Chloride: 102 mmol/L (ref 98–111)
Creatinine, Ser: 0.84 mg/dL (ref 0.44–1.00)
GFR, Estimated: >60 mL/min (ref >60)
Glucose, Bld: 102 mg/dL — ABNORMAL HIGH (ref 70–99)
Potassium: 4 mmol/L (ref 3.5–5.1)
Sodium: 137 mmol/L (ref 135–145)

## 2023-11-21 LAB — TSH: TSH: 4.049 u[IU]/mL (ref 0.350–4.500)

## 2023-11-21 LAB — ECHOCARDIOGRAM COMPLETE
Height: 63 in
S' Lateral: 2.6 cm
Weight: 2098.78 [oz_av]

## 2023-11-21 LAB — MAGNESIUM: Magnesium: 1.8 mg/dL (ref 1.7–2.4)

## 2023-11-21 LAB — GLUCOSE, CAPILLARY: Glucose-Capillary: 92 mg/dL (ref 70–99)

## 2023-11-21 MED ORDER — TRAMADOL HCL 50 MG PO TABS
50.0000 mg | ORAL_TABLET | Freq: Four times a day (QID) | ORAL | 0 refills | Status: AC | PRN
  Filled 2023-11-21: qty 20, 5d supply, fill #0

## 2023-11-21 MED ORDER — APIXABAN 5 MG PO TABS: 5.0000 mg | ORAL_TABLET | Freq: Two times a day (BID) | ORAL | 5 refills | Status: DC

## 2023-11-21 NOTE — Hospital Course (Addendum)
 [name] is a 83 y.o. female with medical history significant of paroxysmal atrial fibrillation followed by electrophysiology, hemochromatosis, iron deficiency anemia, depression, anxiety, GERD, hypothyroidism. Patient presented secondary to worsening chest pain after trauma and found to have evidence of atrial fibrillation with RVR. Cardiology consulted. Syncope workup was negative; possible arrhythmia etiology. Cardiology consulted for management of arrhythmia with patient spontaneously converting back into sinus rhythm prior to discharge. Patient discharged with heart monitor.

## 2023-11-21 NOTE — Discharge Summary (Addendum)
 Physician Discharge Summary   Patient: [name] MRN: [id number] DOB: 04/29/1941  Admit date:     11/20/2023  Discharge date: 11/21/23  Discharge Physician: Aneita Keens, MD   PCP: Perley Bradley, MD   Recommendations at discharge:  PCP visit for hospital follow-up Cardiology visit for arrhythmia follow-up  Discharge Diagnoses: Principal Problem:   Syncope and collapse Active Problems:   Hypothyroidism   ILD (interstitial lung disease) (HCC)   Paroxysmal atrial fibrillation with RVR (HCC)  Resolved Problems:   * No resolved hospital problems. *  Hospital Course: [name] is a 83 y.o. female with medical history significant of paroxysmal atrial fibrillation followed by electrophysiology, hemochromatosis, iron deficiency anemia, depression, anxiety, GERD, hypothyroidism. Patient presented secondary to worsening chest pain after trauma and found to have evidence of atrial fibrillation with RVR. Cardiology consulted. Syncope workup was negative; possible arrhythmia etiology. Cardiology consulted for management of arrhythmia with patient spontaneously converting back into sinus rhythm prior to discharge. Patient discharged with heart monitor.  Assessment and Plan:  Syncope Multiple recent episodes.  Complicated by history of atrial fibrillation and evidence of RVR.  Possible vasovagal syncope.  CT imaging without acute process to explain syncopal episode.  No report of seizure-like activity.  EKG consistent with atrial fibrillation with RVR. Orthostatic vitals negative. Atrial fibrillation on telemetry, with conversion back to sinus rhythm. Transthoracic Echocardiogram significant for an LVEF of 65-70% with grade 2 diastolic dysfunction; mild aortic regurgitation without aortic stenosis.  Cardiology with recommendation for cardiac monitor on discharge.   Paroxysmal atrial fibrillation with RVR Patient follows with electrophysiology, Dr. Rodolfo Clan, as an outpatient.  Patient is  managed on metoprolol  succinate and flecainide .  Patient is also on Eliquis  for stroke prevention. Initial plan to increase metoprolol  and consider cardioversion while inpatient, however patient reverted back to sinus rhythm. Transthoracic Echocardiogram obtained. No changes to home medications.   Interstitial lund disease Patient is on spiriva , Myfortic , prednisone  as an outpatient. Continue regimen on discharge.   Hyperlipidemia Patient is on Lipitor as an outpatient. Continue Lipitor   Iron deficiency anemia Hereditary hemochromatosis Macrocytosis Iron deficiency anemia secondary to chronic blood loss.  Patient follows with medical oncology   Osteoporosis Continue vitamin D    GERD Continue Protonix    Hypothyroidism Patient is managed on levothyroxine  112 mcg daily. Continue on discharge.   Abnormal CT finding Liver lesion noted on CT imaging.  Recommendation for MRI.  Patient will likely need to follow-up outpatient with her PCP for follow-up.   Consultants: Cardiology Procedures performed: Transthoracic Echocardiogram  Disposition: Home Diet recommendation: Cardiac diet   DISCHARGE MEDICATION: Allergies as of 11/21/2023   No Known Allergies      Medication List     TAKE these medications    acetaminophen  325 MG tablet Commonly known as: TYLENOL  Take 650 mg by mouth every 6 (six) hours as needed for mild pain or headache.   apixaban  5 MG Tabs tablet Commonly known as: Eliquis  Take 1 tablet (5 mg total) by mouth 2 (two) times daily.   atorvastatin  10 MG tablet Commonly known as: LIPITOR Take 10 mg by mouth daily.   cetirizine 10 MG tablet Commonly known as: ZYRTEC Take 10 mg by mouth daily.   cyanocobalamin 500 MCG tablet Commonly known as: VITAMIN B12 Take 500 mcg by mouth daily.   flecainide  100 MG tablet Commonly known as: TAMBOCOR  TAKE 1 TABLET(100 MG) BY MOUTH TWICE DAILY   folic acid  1 MG tablet Commonly known as: FOLVITE  Take 1  mg by mouth  daily.   levothyroxine  112 MCG tablet Commonly known as: SYNTHROID  Take 112 mcg by mouth daily.   methocarbamol  500 MG tablet Commonly known as: ROBAXIN  Take 1 tablet (500 mg total) by mouth daily as needed for muscle spasms.   metoprolol  succinate 25 MG 24 hr tablet Commonly known as: TOPROL -XL TAKE 1 TABLET BY MOUTH  DAILY   mycophenolate  360 MG Tbec EC tablet Commonly known as: MYFORTIC  Take 1 tablet (360 mg total) by mouth 2 (two) times daily.   ondansetron  4 MG tablet Commonly known as: Zofran  Take 1 tablet (4 mg total) by mouth every 8 (eight) hours as needed for nausea or vomiting. What changed: when to take this   pantoprazole  20 MG tablet Commonly known as: PROTONIX  Take 20 mg by mouth 2 (two) times daily.   predniSONE  5 MG tablet Commonly known as: DELTASONE  Take 1 tablet (5 mg total) by mouth daily with breakfast.   PreserVision AREDS 2 Caps Take 1 capsule by mouth in the morning and at bedtime.   Spiriva  Respimat 2.5 MCG/ACT Aers Generic drug: Tiotropium Bromide  Monohydrate INHALE 2 PUFFS INTO THE LUNGS DAILY   sulfamethoxazole -trimethoprim  800-160 MG tablet Commonly known as: BACTRIM  DS Take 1 tablet by mouth 3 (three) times a week. TAKE 1 TABLET BY MOUTH ON MONDAY, WEDNESDAY, FRIDAY   Systane 0.4-0.3 % Soln Generic drug: Polyethyl Glycol-Propyl Glycol 1 drop in each eye Ophthalmic as needed   traMADol  50 MG tablet Commonly known as: Ultram  Take 1 tablet (50 mg total) by mouth every 6 (six) hours as needed for up to 5 days for severe pain (pain score 7-10).   Vitamin D  (Cholecalciferol ) 25 MCG (1000 UT) Tabs Take 1 tablet by mouth daily.        Follow-up Information     Perley Bradley, MD Follow up.   Specialty: Family Medicine Contact information: [address] [place] [address] [phone]                Discharge Exam: BP 110/64 (BP Location: Left Arm)   Pulse 60   Temp 98.1 F (36.7 C) (Oral)   Resp 16   Ht (P)  5\' 3"  (1.6 m)   Wt 59.5 kg   SpO2 94%   BMI (P) 23.24 kg/m   General exam: Appears calm and comfortable Respiratory system: Clear to auscultation. Respiratory effort normal. Cardiovascular system: S1 & S2 heard, RRR. No murmurs, rubs, gallops or clicks. Gastrointestinal system: Abdomen is nondistended, soft and nontender. Normal bowel sounds heard. Central nervous system: Alert and oriented. No focal neurological deficits. Musculoskeletal: No edema. No calf tenderness Skin: No cyanosis. No rashes Psychiatry: Judgement and insight appear normal. Mood & affect appropriate.   Condition at discharge: stable  The results of significant diagnostics from this hospitalization (including imaging, microbiology, ancillary and laboratory) are listed below for reference.   Imaging Studies: CT C-SPINE NO CHARGE Result Date: 11/20/2023 CLINICAL DATA:  Marvell Slider, syncope, anticoagulated EXAM: CT CERVICAL SPINE WITHOUT CONTRAST TECHNIQUE: Multidetector CT imaging of the cervical spine was performed without intravenous contrast. Multiplanar CT image reconstructions were also generated. RADIATION DOSE REDUCTION: This exam was performed according to the departmental dose-optimization program which includes automated exposure control, adjustment of the mA and/or kV according to patient size and/or use of iterative reconstruction technique. COMPARISON:  06/25/2023 FINDINGS: Alignment: Alignment is grossly anatomic. Skull base and vertebrae: No acute fracture. No primary bone lesion or focal pathologic process. Soft tissues and spinal canal: No  prevertebral fluid or swelling. No visible canal hematoma. Please refer to separate CT angiography neck report for description of vascular findings. Disc levels: Significant left predominant facet hypertrophy from C2-3 through C6-7. Mild multilevel spondylosis greatest at C5-6 and C6-7. No significant change from previous study. Upper chest: Airway is patent. Emphysematous changes  are seen at the lung apices. Other: Reconstructed images demonstrate no additional findings. IMPRESSION: 1. No acute cervical spine fracture. 2. Stable multilevel cervical degenerative changes. 3. Please refer to separate CT angiography neck report for description of vascular findings. Electronically Signed   By: Bobbye Burrow M.D.   On: 11/20/2023 17:01   CT ANGIO HEAD NECK W WO CM Result Date: 11/20/2023 EXAM: CTA HEAD AND NECK WITH AND WITHOUT 11/20/2023 04:42:56 PM TECHNIQUE: CTA of the head and neck was performed with and without the administration of intravenous contrast. Multiplanar 2D and/or 3D reformatted images are provided for review. Automated exposure control, iterative reconstruction, and/or weight based adjustment of the mA/kV was utilized to reduce the radiation dose to as low as reasonably achievable. Stenosis of the internal carotid arteries measured using NASCET criteria. COMPARISON: CT head without contrast 11/07/2018 CLINICAL HISTORY: Syncope episode and fall. Patient takes alloques. FINDINGS: CTA NECK: AORTIC ARCH AND ARCH VESSELS: Atherosclerotic changes are present in the distal aortic arch and left subclavian artery without significant stenosis or aneurysm. No dissection is present. CAROTID ARTERIES: Mild atherosclerotic changes are present at the carotid bifurcations bilaterally without significant stenosis. VERTEBRAL ARTERIES: The right vertebral artery is dominant. SOFT TISSUES: Diffuse ground-glass attenuation is present at the lung apices bilaterally. Central pulmonary emphysematous changes are present. BONES: Multilevel degenerative changes in the cervical spine are worse left than right. No focal osseous lesions are present. CTA HEAD: ANTERIOR CIRCULATION: No significant stenosis of the internal carotid arteries through the ICA termini. Minimal atherosclerotic calcifications are present within the cavernous internal carotid arteries bilaterally. POSTERIOR CIRCULATION: The right  posterior cerebral artery is of fetal type. OTHER: No dural venous sinus thrombosis on this non-dedicated study. BRAIN: Moderate periventricular lucency and subcortical white matter hypoattenuation is present bilaterally, stable. There is no acute infarct or acute intracranial hemorrhage. No mass effect or midline shift. IMPRESSION: 1. No large vessel occlusion in the head or neck. 2. Mild atherosclerotic changes at the carotid bifurcations and in the cavernous internal carotid arteries bilaterally without significant stenosis. 3. Aortic atherosclerosis. 4. Stable atrophy and advanced white matter disease. Electronically signed by: Audree Leas MD 11/20/2023 05:00 PM EDT RP Workstation: [id number]   CT CHEST ABDOMEN PELVIS W CONTRAST Result Date: 11/20/2023 CLINICAL DATA:  Fall. Blunt trauma. Chest and abdominal pain. On Eliquis . EXAM: CT CHEST, ABDOMEN, AND PELVIS WITH CONTRAST TECHNIQUE: Multidetector CT imaging of the chest, abdomen and pelvis was performed following the standard protocol during bolus administration of intravenous contrast. RADIATION DOSE REDUCTION: This exam was performed according to the departmental dose-optimization program which includes automated exposure control, adjustment of the mA and/or kV according to patient size and/or use of iterative reconstruction technique. CONTRAST:  75mL OMNIPAQUE  IOHEXOL  350 MG/ML SOLN COMPARISON:  06/20/2022 FINDINGS: CT CHEST FINDINGS Cardiovascular: No evidence of thoracic aortic injury or mediastinal hematoma. No pericardial effusion. Mediastinum/Nodes: No evidence of hemorrhage or pneumomediastinum. No masses or pathologically enlarged lymph nodes identified. Lungs/Pleura: No evidence of pulmonary contusion or other infiltrate. No evidence of pneumothorax or hemothorax. Mild-to-moderate centrilobular emphysema again noted. Musculoskeletal: No acute fractures or suspicious bone lesions identified. CT ABDOMEN PELVIS FINDINGS Hepatobiliary: No  hepatic laceration identified. 1.5  cm indeterminate low-attenuation lesion in the liver dome, not visualized on previous exam. Gallbladder is unremarkable. No evidence of biliary ductal dilatation. Pancreas: No parenchymal laceration, mass, or inflammatory changes identified. Spleen: No evidence of splenic laceration. Adrenal/Urinary Tract: No hemorrhage or parenchymal lacerations identified. No evidence of suspicious masses or hydronephrosis. Stomach/Bowel: Tiny hiatal hernia noted. Diverticulosis is seen mainly involving the sigmoid colon, however there is no evidence of diverticulitis. Unopacified bowel loops are otherwise unremarkable in appearance. No evidence of hemoperitoneum. Vascular/Lymphatic: No evidence of abdominal aortic injury or retroperitoneal hemorrhage. No pathologically enlarged lymph nodes identified. Reproductive: Prior hysterectomy noted. Adnexal regions are unremarkable in appearance. Other:  None. Musculoskeletal: No acute fractures or suspicious bone lesions identified. Prior L1 and L2 vertebroplasties noted. IMPRESSION: No evidence of traumatic injury or other acute findings. 1.5 cm indeterminate low-attenuation lesion in liver dome. Recommend abdomen MRI without and with contrast for further characterization. Tiny hiatal hernia. Colonic diverticulosis, without radiographic evidence of diverticulitis. Electronically Signed   By: Marlyce Sine M.D.   On: 11/20/2023 16:53   DG Ribs Unilateral W/Chest Right Result Date: 11/20/2023 CLINICAL DATA:  Chest pain.  Fall. EXAM: RIGHT RIBS AND CHEST - 3+ VIEW COMPARISON:  Rib radiograph 06/10/2020 FINDINGS: No displaced rib fracture. The bones are subjectively under mineralized which could obscure subtle nondisplaced fractures. There is no pneumothorax or pleural effusion. Both lungs are clear. The heart is normal in size, mediastinal contours are normal. Aortic atherosclerosis. Vertebral augmentation in the upper lumbar spine/thoracolumbar  junction. IMPRESSION: No displaced rib fracture or pulmonary complication. Electronically Signed   By: Chadwick Colonel M.D.   On: 11/20/2023 14:06   DG Shoulder Left Result Date: 11/20/2023 CLINICAL DATA:  Pain after fall.  Syncope EXAM: LEFT SHOULDER - 3 VIEW COMPARISON:  None Available. FINDINGS: No fracture or dislocation. Preserved joint spaces. Osteopenia. Artifact from the patient's cardiac leads. IMPRESSION: No acute osseous abnormality. Electronically Signed   By: Adrianna Horde M.D.   On: 11/20/2023 14:02   CT HEAD WO CONTRAST ( ) Result Date: 11/07/2023 CLINICAL DATA:  Provided history: Concussion with brief loss of consciousness. Additional history provided: Recent fall (with head trauma). Headaches. Posterior head pain. EXAM: CT HEAD WITHOUT CONTRAST TECHNIQUE: Contiguous axial images were obtained from the base of the skull through the vertex without intravenous contrast. RADIATION DOSE REDUCTION: This exam was performed according to the departmental dose-optimization program which includes automated exposure control, adjustment of the mA and/or kV according to patient size and/or use of iterative reconstruction technique. COMPARISON:  Head CT 06/25/2023. FINDINGS: Brain: Generalized cerebral atrophy. Patchy and ill-defined hypoattenuation within the cerebral white matter, nonspecific but compatible with mild chronic small vessel ischemic disease. There is no acute intracranial hemorrhage. No demarcated cortical infarct. No extra-axial fluid collection. No evidence of an intracranial mass. No midline shift. Vascular: No hyperdense vessel.  Atherosclerotic calcifications. Skull: No calvarial fracture or aggressive osseous lesion. Sinuses/Orbits: No mass or acute finding within the imaged orbits. No significant paranasal sinus disease at the imaged levels. IMPRESSION: 1.  No evidence of an acute intracranial abnormality. 2. Parenchymal atrophy and chronic small vessel ischemic disease.  Electronically Signed   By: Bascom Lily D.O.   On: 11/07/2023 13:54    Microbiology: Results for orders placed or performed during the hospital encounter of 06/20/22  Urine Culture     Status: None   Collection Time: 06/20/22 12:24 PM   Specimen: Urine, Clean Catch  Result Value Ref Range Status   Specimen Description  Final    URINE, CLEAN CATCH Performed at [facility], [address], [place], [place] [address]    Special Requests   Final    NONE Performed at [facility], [address], [place], [place] [address]    Culture   Final    NO GROWTH Performed at [facility], [address], [place], [place] [address]    Report Status 06/21/2022 FINAL  Final    Labs: CBC: Recent Labs  Lab 11/20/23 1317  WBC 7.5  HGB 13.5  HCT 40.1  MCV 105.8*  PLT 213   Basic Metabolic Panel: Recent Labs  Lab 11/20/23 1317 11/21/23 0252 11/21/23 0823  NA 140 137  --   K 3.4* 4.0  --   CL 102 102  --   CO2 25 26  --   GLUCOSE 111* 102*  --   BUN 9 8  --   CREATININE 0.72 0.84  --   CALCIUM  9.9 9.1  --   MG  --   --  1.8   Liver Function Tests: Recent Labs  Lab 11/20/23 1317  AST 20  ALT 21  ALKPHOS 85  BILITOT 0.4  PROT 6.5  ALBUMIN 4.1   CBG: Recent Labs  Lab 11/21/23 0449  GLUCAP 92    Discharge time spent: 35 minutes.  Signed: Aneita Keens, MD [facility] 11/21/2023

## 2023-11-21 NOTE — TOC CM/SW Note (Signed)
 Transition of Care [facility]) - Inpatient Brief Assessment   Patient Details  Name: [name] MRN: [id number] Date of Birth: 08-25-1940  Transition of Care [facility]) CM/SW Contact:    Jennett Model, RN Phone Number: 11/21/2023, 11:54 AM   Clinical Narrative: From home with youngest daughter,  has PCP , Perley Bradley, and insurance on file, with prescription coverage states has no HH services in place at this time or DME at home.  States family member will transport them home at [employer] and family is support system, states gets medications from [facility] on [place].  Pta self ambulatory.  Patient gives this NCM permission to speak with daughters.  She also states Dannette Dutch is her POA.    Transition of Care Asessment: Insurance and Status: Insurance coverage has been reviewed Patient has primary care physician: Yes Home environment has been reviewed: lives at home , her baby daughter lives with her Prior level of function:: indep Prior/Current Home Services: No current home services Social Drivers of Health Review: SDOH reviewed no interventions necessary Readmission risk has been reviewed: Yes Transition of care needs: no transition of care needs at this time

## 2023-11-21 NOTE — Progress Notes (Addendum)
 Patient Name: [name] Date of Encounter: 11/21/2023 [facility] Cardiologist: Richardo Chandler, MD   Interval Summary  .    Patient reports feeling well  Reports chest pain that she attributes to one of her falls  Says she feels like she has converted back to sinus because she can typically feel when she is in A. Fib  Waiting updated echo  Vital Signs .    Vitals:   11/20/23 1852 11/20/23 1943 11/21/23 0022 11/21/23 0355  BP: (!) 142/102 131/73 112/62 113/72  Pulse: (!) 110 78 69 80  Resp: 16 16 16    Temp: 97.8 F (36.6 C) 97.9 F (36.6 C) 97.6 F (36.4 C) (!) 97.5 F (36.4 C)  TempSrc: Oral Oral Oral Oral  SpO2: 100% 95% 92% 90%  Weight: (P) 59.1 kg   59.5 kg  Height: (P) 5\' 3"  (1.6 m)      No intake or output data in the 24 hours ending 11/21/23 0838    11/21/2023    3:55 AM 11/20/2023    6:52 PM 11/20/2023   12:32 PM  Last 3 Weights  Weight (lbs) 131 lb 2.8 oz 130 lb 4.7 oz 133 lb 9.6 oz  Weight (kg) 59.5 kg 59.1 kg 60.601 kg       Telemetry/ECG.    Sinus, significant artifact, ordered EKG to confirm  - Personally Reviewed  Physical Exam .   GEN: No acute distress.   Neck: No JVD Cardiac: RRR, no murmurs, rubs, or gallops.  Respiratory: Clear to auscultation bilaterally. GI: Soft, nontender, non-distended  MS: No edema  Assessment & Plan .   83 y.o. female with h/o AF on flecainide  and Eliquis , PAC's, emphysema, ILD, pulmonary nodules, GERD, hypothyroidism, [name], SCL-70 antibody positive. Being seen this admission for syncope   Syncope  Reports two syncopal episodes, a week apart First episode at senior prom where she had some alcoholic drinks, did not drink much water, had diarrhea and experienced a prodrome  Positive orthostatics (SBP 119 lying to 98 standing, HR 97 lying, 133 standing) Echo from 06/2022 showed EF 60-65%, G2DD, biatrial enlargement, dilated ascending aorta at 39 mm, small PFO with left to right shunting  QT normal  on flecainide   Awaiting updated echo Consider long term monitor at discharge   Paroxysmal atrial fibrillation with RVR Known history of A. Fib Presented with rates 110s Currently in sinus with HR 60s  Ordered updated EKG to confirm conversion  Continue flecainide  100 mg BID Continue Toprol  25 mg daily  Continue Eliquis  5 mg BID   Per primary ILD HLD GERD Osteoporosis Liver lesion found on CT Anemia    For questions or updates, please contact [facility] Please consult [email or web address] for contact info under        Signed, Jiles Mote, PA-C   Patient seen and examined with TP PA-C.  Agree as above, with the following exceptions and changes as noted below. Feels better now, in sinus bradycardia with apparent PACs. Gen: NAD, CV: RRR, no murmurs, Lungs: clear, Abd: soft, Extrem: Warm, well perfused, no edema, Neuro/Psych: alert and oriented x 3, normal mood and affect. All available labs, radiology testing, previous records reviewed. Will update her echo and definitely need to consider monitor homegoing with her flecainade to rule out arrhythmia as cause of syncope. Sounds like she may have venous pooling due to venous varicosities, though no significant LE swelling ever. May benefit from compression socks.   Mikale Silversmith A Eliav Mechling, MD  11/21/23 10:05 AM

## 2023-11-21 NOTE — TOC CAGE-AID Note (Signed)
 Transition of Care [facility]) - CAGE-AID Screening   Patient Details  Name: [name] MRN: [id number] Date of Birth: 1941-05-07  [facility] CM/SW Contact:    Tyechia Allmendinger E Jonise Weightman, LCSW Phone Number: 11/21/2023, 12:57 PM   Clinical Narrative: Patient states she drinks occasionally but does not feel it is a problem, denies other substance use.   CAGE-AID Screening:    Have You Ever Felt You Ought to Cut Down on Your Drinking or Drug Use?: No Have People Annoyed You By Critizing Your Drinking Or Drug Use?: No Have You Felt Bad Or Guilty About Your Drinking Or Drug Use?: No Have You Ever Had a Drink or Used Drugs First Thing In The Morning to Steady Your Nerves or to Get Rid of a Hangover?: No CAGE-AID Score: 0  Substance Abuse Education Offered: No

## 2023-11-21 NOTE — Plan of Care (Signed)
  Problem: Health Behavior/Discharge Planning: Goal: Ability to manage health-related needs will improve Outcome: Progressing   Problem: Clinical Measurements: Goal: Ability to maintain clinical measurements within normal limits will improve Outcome: Progressing Goal: Cardiovascular complication will be avoided Outcome: Progressing   

## 2023-11-21 NOTE — Progress Notes (Signed)
   Order placed for 14 day live ZIO monitor to be worn for syncope. Placed in hospital. Dr. Stann Earnest to read.   Jiles Mote, PA-C 11/21/2023 2:37 PM

## 2023-11-21 NOTE — Care Management Obs Status (Signed)
 MEDICARE OBSERVATION STATUS NOTIFICATION   Patient Details  Name: [name] MRN: [id number] Date of Birth: 1941/02/18   Medicare Observation Status Notification Given:  Yes    Janith Melnick 11/21/2023, 9:13 AM

## 2023-11-21 NOTE — TOC Transition Note (Signed)
 Transition of Care [facility]) - Discharge Note   Patient Details  Name: [name] MRN: [id number] Date of Birth: Feb 18, 1941  Transition of Care [facility]) CM/SW Contact:  Jennett Model, RN Phone Number: 11/21/2023, 3:17 PM   Clinical Narrative:    For dc today, has no needs.         Patient Goals and CMS Choice            Discharge Placement                       Discharge Plan and Services Additional resources added to the After Visit Summary for                                       Social Drivers of Health (SDOH) Interventions SDOH Screenings   Food Insecurity: No Food Insecurity (11/20/2023)  Housing: Low Risk  (11/20/2023)  Transportation Needs: No Transportation Needs (11/20/2023)  Utilities: Not At Risk (11/20/2023)  Depression (PHQ2-9): Low Risk  (08/21/2018)  Financial Resource Strain: Low Risk  (02/17/2022)   [facility]  Social Connections: Moderately Integrated (11/21/2023)  Tobacco Use: Medium Risk (11/20/2023)     Readmission Risk Interventions     No data to display

## 2023-11-22 ENCOUNTER — Other Ambulatory Visit (HOSPITAL_COMMUNITY): Payer: Self-pay

## 2023-12-07 ENCOUNTER — Other Ambulatory Visit: Payer: Self-pay | Admitting: Family Medicine

## 2023-12-07 DIAGNOSIS — R935 Abnormal findings on diagnostic imaging of other abdominal regions, including retroperitoneum: Secondary | ICD-10-CM

## 2023-12-07 DIAGNOSIS — R55 Syncope and collapse: Secondary | ICD-10-CM

## 2023-12-11 NOTE — Telephone Encounter (Signed)
 Patient advised that she has been given clearance to hold Effient x 2 days prior to endoscopy/colonoscopy scheduled for 01-25-24.  Patient advised to take last dose of Effient on 01-22-24, and she will be advised when to restart Effient by Dr Karene Oto after the procedure.  Patient agreed to plan and verbalized understanding.  No further questions.

## 2023-12-12 ENCOUNTER — Ambulatory Visit: Attending: Cardiology | Admitting: Cardiology

## 2023-12-12 NOTE — Progress Notes (Deleted)
 Cardiology Office Note:   Date:  12/12/2023  ID:  TAMORAH HADA, DOB 24-Aug-1940, MRN [id number] PCP: Perley Bradley, MD  [facility] Providers Cardiologist:  Richardo Chandler, MD { Click to update primary MD,subspecialty MD or APP then REFRESH:1}   History of Present Illness:   [name] is a 83 y.o. female ***  Discussed the use of AI scribe software for clinical note transcription with the patient, who gave verbal consent to proceed.  History of Present Illness      Today patient denies chest pain, shortness of breath, lower extremity edema, fatigue, palpitations, melena, hematuria, hemoptysis, diaphoresis, weakness, presyncope, syncope, orthopnea, and PND.   Studies Reviewed:    EKG:        12-12-23 TTE  IMPRESSIONS     1. Left ventricular ejection fraction, by estimation, is 65 to 70%. The  left ventricle has normal function. The left ventricle has no regional  wall motion abnormalities. There is mild asymmetric left ventricular  hypertrophy of the basal-septal segment.  Left ventricular diastolic parameters are consistent with Grade II  diastolic dysfunction (pseudonormalization).   2. Right ventricular systolic function is normal. The right ventricular  size is normal. Tricuspid regurgitation signal is inadequate for assessing  PA pressure.   3. The mitral valve is grossly normal. Mild mitral valve regurgitation.  No evidence of mitral stenosis.   4. The aortic valve is tricuspid. There is mild calcification of the  aortic valve. Aortic valve regurgitation is mild. Aortic valve sclerosis  is present, with no evidence of aortic valve stenosis.   5. The inferior vena cava is normal in size with greater than 50%  respiratory variability, suggesting right atrial pressure of 3 mmHg.   FINDINGS   Left Ventricle: Left ventricular ejection fraction, by estimation, is 65  to 70%. The left ventricle has normal function. The left ventricle has no   regional wall motion abnormalities. The left ventricular internal cavity  size was normal in size. There is   mild asymmetric left ventricular hypertrophy of the basal-septal segment.  Left ventricular diastolic parameters are consistent with Grade II  diastolic dysfunction (pseudonormalization).   Right Ventricle: The right ventricular size is normal. No increase in  right ventricular wall thickness. Right ventricular systolic function is  normal. Tricuspid regurgitation signal is inadequate for assessing PA  pressure.   Left Atrium: Left atrial size was normal in size.   Right Atrium: Right atrial size was normal in size.   Pericardium: There is no evidence of pericardial effusion.   Mitral Valve: The mitral valve is grossly normal. Mild mitral valve  regurgitation. No evidence of mitral valve stenosis.   Tricuspid Valve: The tricuspid valve is grossly normal. Tricuspid valve  regurgitation is trivial. No evidence of tricuspid stenosis.   Aortic Valve: The aortic valve is tricuspid. There is mild calcification  of the aortic valve. Aortic valve regurgitation is mild. Aortic valve  sclerosis is present, with no evidence of aortic valve stenosis.   Pulmonic Valve: The pulmonic valve was grossly normal. Pulmonic valve  regurgitation is trivial. No evidence of pulmonic stenosis.   Aorta: The aortic root and ascending aorta are structurally normal, with  no evidence of dilitation.   Venous: The inferior vena cava is normal in size with greater than 50%  respiratory variability, suggesting right atrial pressure of 3 mmHg.   IAS/Shunts: The atrial septum is grossly normal.   Risk Assessment/Calculations:   {Does this patient have ATRIAL FIBRILLATION?[phone]} No  BP recorded.  {Refresh Note OR Click here to enter BP  :1}***        Physical Exam:   VS:  There were no vitals taken for this visit.   Wt Readings from Last 3 Encounters:  11/21/23 131 lb 2.8 oz (59.5 kg)   11/16/23 133 lb 9.6 oz (60.6 kg)  11/16/23 133 lb 6.4 oz (60.5 kg)     Physical Exam  Physical Exam    ASSESSMENT AND PLAN:     Assessment and Plan Assessment & Plan          {Are you ordering a CV Procedure (e.g. stress test, cath, DCCV, TEE, etc)?   Press F2        :[id number]}   Signed, Leala Prince, PA-C

## 2023-12-13 ENCOUNTER — Encounter: Payer: Self-pay | Admitting: Cardiology

## 2023-12-18 NOTE — Addendum Note (Signed)
 Encounter addended by: Malvina Pina A on: 12/18/2023 10:23 AM  Actions taken: Imaging Exam ended

## 2023-12-21 DIAGNOSIS — R55 Syncope and collapse: Secondary | ICD-10-CM | POA: Diagnosis not present

## 2023-12-22 ENCOUNTER — Encounter: Admitting: Gastroenterology

## 2023-12-23 ENCOUNTER — Encounter (HOSPITAL_COMMUNITY): Payer: Self-pay | Admitting: Interventional Radiology

## 2023-12-25 ENCOUNTER — Telehealth: Payer: Self-pay | Admitting: Cardiology

## 2023-12-25 NOTE — Telephone Encounter (Signed)
   Attempted various times to reach this patient via mobile phone and home phone without any answer. Wanted to discuss results of her cardiac monitor and see if she was still having any symptoms and consider possibly increasing her beta-blocker dose. Follow up appointments have been arranged with E but was hoping to speak with her beforehand. Will continue to attempt to reach out to patient.  Waddell DELENA Donath, PA-C 12/25/2023 3:53 PM

## 2024-01-12 ENCOUNTER — Ambulatory Visit: Payer: Self-pay | Admitting: Cardiology

## 2024-01-14 ENCOUNTER — Ambulatory Visit
Admission: RE | Admit: 2024-01-14 | Discharge: 2024-01-14 | Disposition: A | Discharge status: Home / Self Care | Source: Ambulatory Visit | Attending: Family Medicine | Admitting: Family Medicine

## 2024-01-14 DIAGNOSIS — R935 Abnormal findings on diagnostic imaging of other abdominal regions, including retroperitoneum: Secondary | ICD-10-CM

## 2024-01-14 DIAGNOSIS — R55 Syncope and collapse: Secondary | ICD-10-CM

## 2024-01-14 MED ORDER — GADOPICLENOL 0.5 MMOL/ML IV SOLN
6.0000 mL | Freq: Once | INTRAVENOUS | Status: AC | PRN
  Administered 2024-01-14: 6 mL via INTRAVENOUS

## 2024-01-17 ENCOUNTER — Encounter: Payer: Self-pay | Admitting: Gastroenterology

## 2024-01-18 ENCOUNTER — Ambulatory Visit: Admitting: Cardiology

## 2024-01-22 ENCOUNTER — Telehealth: Payer: Self-pay | Admitting: Gastroenterology

## 2024-01-22 NOTE — Telephone Encounter (Signed)
 Patient wanted to verify that she should start drinking prep on 01-24-24 at 6pm.  Patient advised start time to drink 1st dose of Suprep is at 6pm the evening prior.   Patient agreed to plan and verbalized understanding.  No further questions.

## 2024-01-22 NOTE — Telephone Encounter (Signed)
 Patient called and stated that she would like to speak to the nurse in regards to her new endo and colon procedure time changing from 12:30 to 7 Am. Patient is wanting to go over her times on her instruction before she starts her prep the day before her procedure which is schedule for July the 31 st. Please advise.

## 2024-01-22 NOTE — Telephone Encounter (Signed)
Left message for patient to return call to further discuss.  ?

## 2024-01-25 ENCOUNTER — Encounter: Payer: Self-pay | Admitting: Gastroenterology

## 2024-01-25 ENCOUNTER — Ambulatory Visit: Admitting: Gastroenterology

## 2024-01-25 VITALS — BP 122/74 | HR 98 | Temp 97.4°F | Resp 17 | Ht 63.0 in | Wt 134.0 lb

## 2024-01-25 DIAGNOSIS — K449 Diaphragmatic hernia without obstruction or gangrene: Secondary | ICD-10-CM

## 2024-01-25 DIAGNOSIS — R197 Diarrhea, unspecified: Secondary | ICD-10-CM | POA: Diagnosis not present

## 2024-01-25 DIAGNOSIS — K5730 Diverticulosis of large intestine without perforation or abscess without bleeding: Secondary | ICD-10-CM

## 2024-01-25 DIAGNOSIS — K219 Gastro-esophageal reflux disease without esophagitis: Secondary | ICD-10-CM

## 2024-01-25 DIAGNOSIS — K317 Polyp of stomach and duodenum: Secondary | ICD-10-CM

## 2024-01-25 DIAGNOSIS — D509 Iron deficiency anemia, unspecified: Secondary | ICD-10-CM

## 2024-01-25 DIAGNOSIS — K2970 Gastritis, unspecified, without bleeding: Secondary | ICD-10-CM

## 2024-01-25 DIAGNOSIS — I480 Paroxysmal atrial fibrillation: Secondary | ICD-10-CM

## 2024-01-25 DIAGNOSIS — R195 Other fecal abnormalities: Secondary | ICD-10-CM

## 2024-01-25 MED ORDER — PANTOPRAZOLE SODIUM 40 MG PO TBEC: 40.0000 mg | DELAYED_RELEASE_TABLET | Freq: Two times a day (BID) | ORAL | 3 refills | Status: DC

## 2024-01-25 MED ORDER — SODIUM CHLORIDE 0.9 % IV SOLN: 500.0000 mL | INTRAVENOUS | Status: DC

## 2024-01-25 NOTE — Op Note (Signed)
 [facility] Patient Name: [name] Procedure Date: 01/25/2024 7:59 AM MRN: [id number] Endoscopist: Sandor Flatter , MD, [id number] Age: 83 Referring MD:  Date of Birth: 10-27-1940 Gender: Female Account #: [id number] Procedure:                Upper GI endoscopy Indications:              Heartburn, Esophageal reflux, Diarrhea Medicines:                Monitored Anesthesia Care Procedure:                Pre-Anesthesia Assessment:                           - Prior to the procedure, a History and Physical                            was performed, and patient medications and                            allergies were reviewed. The patient's tolerance of                            previous anesthesia was also reviewed. The risks                            and benefits of the procedure and the sedation                            options and risks were discussed with the patient.                            All questions were answered, and informed consent                            was obtained. Prior Anticoagulants: The patient has                            taken Eliquis  (apixaban ), last dose was 2 days                            prior to procedure. ASA Grade Assessment: III - A                            patient with severe systemic disease. After                            reviewing the risks and benefits, the patient was                            deemed in satisfactory condition to undergo the                            procedure.  After obtaining informed consent, the endoscope was                            passed under direct vision. Throughout the                            procedure, the patient's blood pressure, pulse, and                            oxygen saturations were monitored continuously. The                            GIF [id number] #[id number] was introduced through the                            mouth, and advanced to the second part of  duodenum.                            The upper GI endoscopy was accomplished without                            difficulty. The patient tolerated the procedure                            well. Scope In: Scope Out: Findings:                 The examined esophagus was normal.                           The Z-line was regular and was found 35 cm from the                            incisors.                           A 1 cm hiatal hernia was present.                           Localized moderate inflammation characterized by                            congestion (edema) and erythema was found at the                            incisura and in the gastric antrum. Biopsies were                            taken with a cold forceps for histology. Estimated                            blood loss was minimal.                           Multiple small sessile polyps with no bleeding and  no stigmata of recent bleeding were found in the                            gastric fundus and in the gastric body. These                            polyps were removed with a cold biopsy forceps.                            Resection and retrieval were complete. Estimated                            blood loss was minimal.                           The examined duodenum was normal. Biopsies were                            taken with a cold forceps for histology. Estimated                            blood loss was minimal. Complications:            No immediate complications. Estimated Blood Loss:     Estimated blood loss was minimal. Impression:               - Normal esophagus.                           - Z-line regular, 35 cm from the incisors.                           - 1 cm hiatal hernia.                           - Gastritis. Biopsied.                           - Multiple gastric polyps. Resected and retrieved.                           - Normal examined duodenum.  Biopsied. Recommendation:           - Patient has a contact number available for                            emergencies. The signs and symptoms of potential                            delayed complications were discussed with the                            patient. Return to normal activities tomorrow.                            Written discharge instructions were provided to the  patient.                           - Resume previous diet.                           - Continue present medications.                           - Await pathology results.                           - Increase Protonix  (pantoprazole ) to 40 mg PO BID                            for 6 weeks to promote mucosal healing of                            gastritis, then can reduce back to 40 mg daily for                            ongoing reflux control. Sandor Flatter, MD 01/25/2024 8:47:21 AM

## 2024-01-25 NOTE — Progress Notes (Unsigned)
 Cardiology Office Note Date:  01/25/2024  Patient ID:  [name], [name] Aug 27, 1940, MRN [id number] PCP:  Cleotilde Planas, MD  Electrophysiologist:  Dr. Fernande Hematologist: Dr. Freddie Rheumatology: Dr. Dolphus Pulmonology: Dr. Geronimo    Chief Complaint:  *** post hospital, syncope   History of Present Illness: [name] is a 83 y.o. female with history of hemachromatosis, GERD, depression, hypothyroidism, Reynaud's,  ILD PAFib   She reported remotely having been found with Pulmonary Fibrosis, is seeing Dr. Geronimo  She is also following with Rheumatology, Dr. Dolphus she says 2/2 her Reynaud's and + antibody for scleroderma.  She saw Dr. Fernande 11/17/21, no symptoms of AFib, + for fatigue, no overt bleeding, + superficial bruising, continued on her Eliquis . Planned to reach out to pulm to consider sleep testing.  I saw her 02/08/23 She is doing well Minimal/brief palpitations, very happy with her rhythm control. 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 EKG noted stable intervals Planned for 6 mo visit  Admitted 11/20/23 after 2 syncopal events  Reported while at a senior prom and had syncope while dancing. At [employer]. No prodrome Was checked out by EMS and friend drove her home. Sunday she got up at 2:00 am went to bathroom and felt dizzy and past out with trauma to chest and right side  In the ER found in AFib w/fast rates Dr. Delford saw her, reported normal QRS/QT at relatively fast rate. EF normal on TTE 07/13/22  Cardiology team followed her Pt reported did not drink much water, had diarrhea and experienced a prodrome  Positive orthostatics (SBP 119  lying to 98 standing, HR 97 lying, 133 standing) Had converted back to SR Echo w/ Cleared for discharge w/monitor Discharged 11/21/23  LVEF 65-70%, no WMA, mild asymmetric left ventricular  hypertrophy of the basal-septal segment.  Left ventricular diastolic parameters are consistent with Grade II DD  Monitor noted below   TODAY  *** flecainide , nodal blocker, intervals *** eliquis , dose, bleeding *** hypoxia? *** volume depleted w/+ orthostatics   Past Medical History:  Diagnosis Date   Anxiety    hx of- no longer needs medication   Atrial fibrillation (HCC)    a. Flecainide  therapy;  b. event monitor 4/12   Chest pain    a. GXT myoview  4/12: no isch., EF 86%;   b. 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 Freddie   History of syncope    Hypothyroidism    Osteoporosis     Past Surgical History:  Procedure Laterality Date   COLONOSCOPY     IR KYPHO LUMBAR INC FX REDUCE BONE BX UNI/BIL CANNULATION INC/IMAGING  06/30/2022   IR KYPHO LUMBAR INC FX REDUCE BONE BX UNI/BIL CANNULATION INC/IMAGING  08/16/2022   MOUTH SURGERY     SHOULDER ARTHROSCOPY Left    TOTAL ABDOMINAL HYSTERECTOMY     UPPER GASTROINTESTINAL ENDOSCOPY     VARICOSE VEIN SURGERY      Current Outpatient Medications  Medication Sig Dispense Refill   acetaminophen  (TYLENOL ) 325 MG tablet Take 650  mg by mouth every 6 (six) hours as needed for mild pain or headache.     apixaban  (ELIQUIS ) 5 MG TABS tablet Take 1 tablet (5 mg total) by mouth 2 (two) times daily. 60 tablet 5   atorvastatin  (LIPITOR) 10 MG tablet Take 10 mg by mouth daily.     cetirizine (ZYRTEC) 10 MG tablet Take 10 mg by mouth daily.     cyanocobalamin  (VITAMIN B12) 500 MCG tablet Take 500 mcg by mouth daily.     flecainide  (TAMBOCOR ) 100 MG tablet TAKE 1 TABLET(100 MG) BY MOUTH TWICE DAILY 180 tablet 3   folic acid   (FOLVITE ) 1 MG tablet Take 1 mg by mouth daily.     levothyroxine  (SYNTHROID , LEVOTHROID) 112 MCG tablet Take 112 mcg by mouth daily.     methocarbamol  (ROBAXIN ) 500 MG tablet Take 1 tablet (500 mg total) by mouth daily as needed for muscle spasms. 30 tablet 0   metoprolol  succinate (TOPROL -XL) 25 MG 24 hr tablet TAKE 1 TABLET BY MOUTH  DAILY (Patient taking differently: Take 25 mg by mouth daily.) 90 tablet 2   Multiple Vitamins-Minerals (PRESERVISION AREDS 2) CAPS Take 1 capsule by mouth in the morning and at bedtime.     mycophenolate  (MYFORTIC ) 360 MG TBEC EC tablet Take 1 tablet (360 mg total) by mouth 2 (two) times daily. 180 tablet 3   ondansetron  (ZOFRAN ) 4 MG tablet Take 1 tablet (4 mg total) by mouth every 8 (eight) hours as needed for nausea or vomiting. (Patient taking differently: Take 4 mg by mouth as needed for nausea or vomiting.) 90 tablet 3   pantoprazole  (PROTONIX ) 20 MG tablet Take 20 mg by mouth 2 (two) times daily.     pantoprazole  (PROTONIX ) 40 MG tablet Take 1 tablet (40 mg total) by mouth 2 (two) times daily. Take twice daily for 6 weeks , then reduce back to 40mg  daily 180 tablet 3   Polyethyl Glycol-Propyl Glycol (SYSTANE) 0.4-0.3 % SOLN 1 drop in each eye Ophthalmic as needed     predniSONE  (DELTASONE ) 5 MG tablet Take 1 tablet (5 mg total) by mouth daily with breakfast. 90 tablet 1   SPIRIVA  RESPIMAT 2.5 MCG/ACT AERS INHALE 2 PUFFS INTO THE LUNGS DAILY 4 g 11   sulfamethoxazole -trimethoprim  (BACTRIM  DS) 800-160 MG tablet Take 1 tablet by mouth 3 (three) times a week. TAKE 1 TABLET BY MOUTH ON MONDAY, WEDNESDAY, FRIDAY 90 tablet 3   Vitamin D , Cholecalciferol , 25 MCG (1000 UT) TABS Take 1 tablet by mouth daily.     No current facility-administered medications for this visit.    Allergies:   Patient has no known allergies.   Social History:  The patient  reports that she quit smoking about 26 years ago. Her smoking use included cigarettes. She started smoking about 68  years ago. She has a 42 pack-year smoking history. She has never been exposed to tobacco smoke. She has never used smokeless tobacco. She reports current alcohol  use. She reports that she does not use drugs.   Family History:  The patient's family history includes Heart disease (age of onset: 35) in her father; Hemochromatosis in her brother; Stroke in her mother.  ROS:  Please see the history of present illness.  All other systems are reviewed and otherwise negative.   PHYSICAL EXAM:  VS:  There were no vitals taken for this visit. BMI: There is no height or weight on file to calculate BMI. Well nourished, well developed, thin body habitus,  in  no acute distress  HEENT: normocephalic, atraumatic  Neck: no JVD, carotid bruits or masses Cardiac: *** RRR; no significant murmurs, no rubs, or gallops Lungs: *** CTA b/l, no wheezing, rhonchi or rales  Abd: soft, nontender MS: no deformity, age appropriate atrophy Ext: *** no edema Skin: warm and dry, no rash Neuro:  No gross deficits appreciated Psych: euthymic mood, full affect   EKG:  Done today and reviewed by myself  ***  02/08/23: SR 63bpm, 1st degree AVBlock , QRS 88ms, QTc 442  08/31/20 :  SR 79bpm, PR , QRS 86ms, QTc 04/05/19: SR 62bpm, PR 88ms, QRS 90ms, QTc  June 2025, monitor Patient had a min HR of 48 bpm, max HR of 190 bpm, and avg HR of 66 bpm.  Predominant underlying rhythm was Sinus Rhythm. First Degree AV Block was present.  2291 Supraventricular Tachycardia runs occurred, the run with the fastest interval lasting 9 beats  with a max rate of 190 bpm,  the longest lasting 9 mins 48 secs with an avg rate of 94 bpm. Isolated SVEs were frequent (5.1%, 59245),  SVE Couplets were occasional (1.7%, 9615),  and SVE Triplets were rare (<1.0%, 3717). I solated VEs were rare (<1.0%),  and no VE Couplets or VE Triplets were present.  11/21/23: TTE 1. Left ventricular ejection fraction, by estimation, is 65  to 70%. The  left ventricle has normal function. The left ventricle has no regional  wall motion abnormalities. There is mild asymmetric left ventricular  hypertrophy of the basal-septal segment.  Left ventricular diastolic parameters are consistent with Grade II  diastolic dysfunction (pseudonormalization).   2. Right ventricular systolic function is normal. The right ventricular  size is normal. Tricuspid regurgitation signal is inadequate for assessing  PA pressure.   3. The mitral valve is grossly normal. Mild mitral valve regurgitation.  No evidence of mitral stenosis.   4. The aortic valve is tricuspid. There is mild calcification of the  aortic valve. Aortic valve regurgitation is mild. Aortic valve sclerosis  is present, with no evidence of aortic valve stenosis.   5. The inferior vena cava is normal in size with greater than 50%  respiratory variability, suggesting right atrial pressure of 3 mmHg.   07/13/22: TTE 1. Left ventricular ejection fraction, by estimation, is 60 to 65%. The  left ventricle has normal function. The left ventricle has no regional  wall motion abnormalities. Left ventricular diastolic parameters are  consistent with Grade II diastolic  dysfunction (pseudonormalization). The average left ventricular global  longitudinal strain is -21.3 %. The global longitudinal strain is normal.   2. Right ventricular systolic function is normal. The right ventricular  size is normal. There is normal pulmonary artery systolic pressure.   3. Left atrial size was moderately dilated.   4. Right atrial size was mild to moderately dilated.   5. The mitral valve is grossly normal. Trivial mitral valve  regurgitation. No evidence of mitral stenosis.   6. The aortic valve is tricuspid. There is mild calcification of the  aortic valve. Aortic valve regurgitation is trivial. No aortic stenosis is  present.   7. Aortic dilatation noted. There is borderline dilatation of the   ascending aorta, measuring 39 mm.   8. The inferior vena cava is normal in size with greater than 50%  respiratory variability, suggesting right atrial pressure of 3 mmHg.   9. Evidence of atrial level shunting detected by color flow Doppler.  There is a small patent  foramen ovale with predominantly left to right  shunting across the atrial septum.   Comparison(s): No significant change from prior study.    10/14/10: TTE Study Conclusions - Left ventricle: The cavity size was normal. There was mild focal   basal hypertrophy of the septum. Systolic function was normal. The   estimated ejection fraction was in the range of 55% to 65%. Wall   motion was normal; there were no regional wall motion   abnormalities. Doppler parameters are consistent with abnormal   left ventricular relaxation (grade 1 diastolic dysfunction). - Mitral valve: Calcified annulus. - Left atrium: The atrium was mildly dilated.  Recent Labs: 11/20/2023: ALT 21; Hemoglobin 13.5; Platelets 213 11/21/2023: BUN 8; Creatinine, Ser 0.84; Magnesium 1.8; Potassium 4.0; Sodium 137; TSH 4.049  No results found for requested labs within last 365 days.   CrCl cannot be calculated (Patient's most recent lab result is older than the maximum 21 days allowed.).   Wt Readings from Last 3 Encounters:  01/25/24 134 lb (60.8 kg)  11/21/23 131 lb 2.8 oz (59.5 kg)  11/16/23 133 lb 9.6 oz (60.6 kg)     Other studies reviewed: Additional studies/records reviewed today include: summarized above  ASSESSMENT AND PLAN:  1. Paroxysmal AFib     CHA2DS2Vasc is 3, on Eliquis , *** appropriately dosed      On Flecainide  w/*** metoprolol      ***  2. Secondary hypercoagulable state  3. Syncope ***   Disposition: see her back in ***, sooner if neeed   Current medicines are reviewed at length with the patient today.  The patient did not have any concerns regarding medicines.  Bonney Charlies Arthur, PA-C 01/25/2024 2:50 PM      [facility] [address] [place] [address] [phone] (office)  [phone] (fax)

## 2024-01-25 NOTE — Progress Notes (Signed)
 [place], gd SR, tolerated procedure well, VSS, report to RN

## 2024-01-25 NOTE — Progress Notes (Signed)
 GASTROENTEROLOGY PROCEDURE H&P NOTE   Primary Care Physician: Cleotilde Planas, MD    Reason for Procedure:  Diarrhea, change in bowel habits, fecal urgency, abdominal cramping, GERD, heartburn, chronic cough  Plan:    EGD, colonoscopy  Patient is appropriate for endoscopic procedure(s) in the ambulatory ([facility]) setting.  The nature of the procedure, as well as the risks, benefits, and alternatives were carefully and thoroughly reviewed with the patient. Ample time for discussion and questions allowed. The patient understood, was satisfied, and agreed to proceed.     HPI: [name] is a 83 y.o. female who presents for EGD and colonoscopy for evaluation of multiple GI symptoms, to include diarrhea, change in bowel habits, fecal urgency, abdominal cramping, fecal seepage.  Additionally, history of GERD with heartburn and chronic cough, currently treated with pantoprazole  40 mg daily.  Has been holding Eliquis  x 2 days for procedures today.  Has Cardiology and Pulmonary clearance to proceed with procedures today.  Stable labs from 10/2023.  MRI abdomen on 01/14/2024 reviewed and notable for benign hepatic hemangioma, subcentimeter hepatic cysts, otherwise normal GI tract.  Past Medical History:  Diagnosis Date   Anxiety    hx of- no longer needs medication   Atrial fibrillation (HCC)    a. Flecainide  therapy;  b. event monitor 4/12   Chest pain    a. GXT myoview  4/12: no isch., EF 86%;   b. 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 Freddie   History of syncope    Hypothyroidism    Osteoporosis     Past Surgical History:  Procedure Laterality Date   COLONOSCOPY     IR KYPHO LUMBAR INC FX REDUCE BONE BX UNI/BIL CANNULATION INC/IMAGING  06/30/2022   IR KYPHO LUMBAR INC FX REDUCE BONE BX UNI/BIL CANNULATION INC/IMAGING  08/16/2022   MOUTH SURGERY      SHOULDER ARTHROSCOPY Left    TOTAL ABDOMINAL HYSTERECTOMY     UPPER GASTROINTESTINAL ENDOSCOPY     VARICOSE VEIN SURGERY      Prior to Admission medications   Medication Sig Start Date End Date Taking? Authorizing Provider  acetaminophen  (TYLENOL ) 325 MG tablet Take 650 mg by mouth every 6 (six) hours as needed for mild pain or headache.   Yes [provider]  atorvastatin  (LIPITOR) 10 MG tablet Take 10 mg by mouth daily. 06/18/19  Yes [provider]  cetirizine (ZYRTEC) 10 MG tablet Take 10 mg by mouth daily.   Yes [provider]  cyanocobalamin  (VITAMIN B12) 500 MCG tablet Take 500 mcg by mouth daily.   Yes [provider]  flecainide  (TAMBOCOR ) 100 MG tablet TAKE 1 TABLET(100 MG) BY MOUTH TWICE DAILY 03/21/23  Yes Ursuy, Renee Lynn, PA-C  folic acid  (FOLVITE ) 1 MG tablet Take 1 mg by mouth daily.   Yes [provider]  levothyroxine  (SYNTHROID , LEVOTHROID) 112 MCG tablet Take 112 mcg by mouth daily.   Yes [provider]  metoprolol  succinate (TOPROL -XL) 25 MG 24 hr tablet TAKE 1 TABLET BY MOUTH  DAILY Patient taking differently: Take 25 mg by mouth daily. 05/29/17  Yes Fernande Elspeth BROCKS, MD  Multiple Vitamins-Minerals (PRESERVISION AREDS 2) CAPS Take 1 capsule by mouth in the morning and at bedtime.   Yes [provider]  mycophenolate  (MYFORTIC ) 360 MG TBEC EC tablet Take 1 tablet (360 mg total) by mouth 2 (  two) times daily. 08/03/23  Yes Geronimo Amel, MD  ondansetron  (ZOFRAN ) 4 MG tablet Take 1 tablet (4 mg total) by mouth every 8 (eight) hours as needed for nausea or vomiting. Patient taking differently: Take 4 mg by mouth as needed for nausea or vomiting. 10/03/23  Yes Geronimo Amel, MD  pantoprazole  (PROTONIX ) 20 MG tablet Take 20 mg by mouth 2 (two) times daily. 11/10/21  Yes [provider]  Polyethyl Glycol-Propyl Glycol (SYSTANE) 0.4-0.3 % SOLN 1 drop in each eye Ophthalmic as needed   Yes [provider]  predniSONE  (DELTASONE ) 5 MG tablet Take 1 tablet (5 mg total) by mouth daily with breakfast. 10/03/23  Yes Geronimo Amel, MD  SPIRIVA  RESPIMAT 2.5 MCG/ACT AERS INHALE 2 PUFFS INTO THE LUNGS DAILY 02/23/23  Yes Geronimo Amel, MD  sulfamethoxazole -trimethoprim  (BACTRIM  DS) 800-160 MG tablet Take 1 tablet by mouth 3 (three) times a week. TAKE 1 TABLET BY MOUTH ON MONDAY, WEDNESDAY, FRIDAY 01/27/23  Yes Ramaswamy, Amel, MD  Vitamin D , Cholecalciferol , 25 MCG (1000 UT) TABS Take 1 tablet by mouth daily.   Yes [provider]  apixaban  (ELIQUIS ) 5 MG TABS tablet Take 1 tablet (5 mg total) by mouth 2 (two) times daily. 11/21/23   Fernande Elspeth BROCKS, MD  methocarbamol  (ROBAXIN ) 500 MG tablet Take 1 tablet (500 mg total) by mouth daily as needed for muscle spasms. 11/16/23   Cheryl Waddell HERO, PA-C    Current Outpatient Medications  Medication Sig Dispense Refill   acetaminophen  (TYLENOL ) 325 MG tablet Take 650 mg by mouth every 6 (six) hours as needed for mild pain or headache.     atorvastatin  (LIPITOR) 10 MG tablet Take 10 mg by mouth daily.     cetirizine (ZYRTEC) 10 MG tablet Take 10 mg by mouth daily.     cyanocobalamin  (VITAMIN B12) 500 MCG tablet Take 500 mcg by mouth daily.     flecainide  (TAMBOCOR ) 100 MG tablet TAKE 1 TABLET(100 MG) BY MOUTH TWICE DAILY 180 tablet 3   folic acid  (FOLVITE ) 1 MG tablet Take 1 mg by mouth daily.     levothyroxine  (SYNTHROID , LEVOTHROID) 112 MCG tablet Take 112 mcg by mouth daily.     metoprolol  succinate (TOPROL -XL) 25 MG 24 hr tablet TAKE 1 TABLET BY MOUTH  DAILY (Patient taking differently: Take 25 mg by mouth daily.) 90 tablet 2   Multiple Vitamins-Minerals (PRESERVISION AREDS 2) CAPS Take 1 capsule by mouth in the morning and at bedtime.     mycophenolate  (MYFORTIC ) 360 MG TBEC EC tablet Take 1 tablet (360 mg total) by mouth 2 (two) times daily. 180 tablet 3   ondansetron  (ZOFRAN ) 4 MG tablet Take 1 tablet (4 mg total) by mouth every 8  (eight) hours as needed for nausea or vomiting. (Patient taking differently: Take 4 mg by mouth as needed for nausea or vomiting.) 90 tablet 3   pantoprazole  (PROTONIX ) 20 MG tablet Take 20 mg by mouth 2 (two) times daily.     Polyethyl Glycol-Propyl Glycol (SYSTANE) 0.4-0.3 % SOLN 1 drop in each eye Ophthalmic as needed     predniSONE  (DELTASONE ) 5 MG tablet Take 1 tablet (5 mg total) by mouth daily with breakfast. 90 tablet 1   SPIRIVA  RESPIMAT 2.5 MCG/ACT AERS INHALE 2 PUFFS INTO THE LUNGS DAILY 4 g 11   sulfamethoxazole -trimethoprim  (BACTRIM  DS) 800-160 MG tablet Take 1 tablet by mouth 3 (three) times a week. TAKE 1 TABLET BY MOUTH ON MONDAY, WEDNESDAY, FRIDAY 90 tablet 3  Vitamin D , Cholecalciferol , 25 MCG (1000 UT) TABS Take 1 tablet by mouth daily.     apixaban  (ELIQUIS ) 5 MG TABS tablet Take 1 tablet (5 mg total) by mouth 2 (two) times daily. 60 tablet 5   methocarbamol  (ROBAXIN ) 500 MG tablet Take 1 tablet (500 mg total) by mouth daily as needed for muscle spasms. 30 tablet 0   Current Facility-Administered Medications  Medication Dose Route Frequency Provider Last Rate Last Admin   0.9 %  sodium chloride  infusion  500 mL Intravenous Continuous Arnet Hofferber V, DO        Allergies as of 01/25/2024   (No Known Allergies)    Family History  Problem Relation Age of Onset   Stroke Mother    Heart disease Father 50   Hemochromatosis Brother    Breast cancer Neg Hx    Colon cancer Neg Hx    Esophageal cancer Neg Hx    Rectal cancer Neg Hx    Stomach cancer Neg Hx     Social History   Socioeconomic History   Marital status: Married    Spouse name: Not on file   Number of children: Not on file   Years of education: Not on file   Highest education level: Not on file  Occupational History   Occupation: RETIRED    Employer: RETIRED   Occupation: retired  Tobacco Use   Smoking status: Former    Current packs/day: 0.00    Average packs/day: 1 pack/day for 42.0 years  (42.0 ttl pk-yrs)    Types: Cigarettes    Start date: 06/28/1955    Quit date: 06/27/1997    Years since quitting: 26.5    Passive exposure: Never   Smokeless tobacco: Never  Vaping Use   Vaping status: Never Used  Substance and Sexual Activity   Alcohol  use: Yes    Comment: occ   Drug use: No   Sexual activity: Not on file  Other Topics Concern   Not on file  Social History Narrative   REGULAR EXERCISE   Social Drivers of Health   Financial Resource Strain: Low Risk  (02/17/2022)   Received from [facility]   Financial Resource Strain    In the past 12 months, have you experienced difficulty paying for basic needs like housing, utilities, food, transportation, or clothing: Not on file    Are there any financial concerns that limit you from seeing the doctor?: Not on file  Food Insecurity: No Food Insecurity (11/20/2023)   Hunger Vital Sign    Worried About Running Out of Food in the Last Year: Never true    Ran Out of Food in the Last Year: Never true  Transportation Needs: No Transportation Needs (11/20/2023)   PRAPARE - Administrator, Civil Service (Medical): No    Lack of Transportation (Non-Medical): No  Physical Activity: Not on file  Stress: Not on file  Social Connections: Moderately Integrated (11/21/2023)   Social Connection and Isolation Panel    Frequency of Communication with Friends and Family: More than three times a week    Frequency of Social Gatherings with Friends and Family: More than three times a week    Attends Religious Services: 1 to 4 times per year    Active Member of [employer] or Organizations: Yes    Attends Banker Meetings: More than 4 times per year    Marital Status: Widowed  Intimate Partner Violence: Not At Risk (11/20/2023)   Humiliation, Afraid,  Rape, and Kick questionnaire    Fear of Current or Ex-Partner: No    Emotionally Abused: No    Physically Abused: No    Sexually Abused: No    Physical Exam: Vital  signs in last 24 hours: @BP  (!) 138/92   Temp (!) 97.4 F (36.3 C) (Skin)   Ht 5' 3 (1.6 m)   Wt 134 lb (60.8 kg)   BMI 23.74 kg/m  GEN: NAD EYE: Sclerae anicteric ENT: MMM CV: Non-tachycardic Pulm: CTA b/l GI: Soft, NT/ND NEURO:  Alert & Oriented x 3   Sandor Flatter, DO [facility]   01/25/2024 7:51 AM

## 2024-01-25 NOTE — Op Note (Signed)
 [facility] Patient Name: [name] Procedure Date: 01/25/2024 7:59 AM MRN: [id number] Endoscopist: Sandor Flatter , MD, [id number] Age: 83 Referring MD:  Date of Birth: December 23, 1940 Gender: Female Account #: [id number] Procedure:                Colonoscopy Indications:              Change in bowel habits, Diarrhea Medicines:                Monitored Anesthesia Care Procedure:                Pre-Anesthesia Assessment:                           - Prior to the procedure, a History and Physical                            was performed, and patient medications and                            allergies were reviewed. The patient's tolerance of                            previous anesthesia was also reviewed. The risks                            and benefits of the procedure and the sedation                            options and risks were discussed with the patient.                            All questions were answered, and informed consent                            was obtained. Prior Anticoagulants: The patient has                            taken Eliquis  (apixaban ), last dose was 2 days                            prior to procedure. ASA Grade Assessment: III - A                            patient with severe systemic disease. After                            reviewing the risks and benefits, the patient was                            deemed in satisfactory condition to undergo the                            procedure.  After obtaining informed consent, the colonoscope                            was passed under direct vision. Throughout the                            procedure, the patient's blood pressure, pulse, and                            oxygen saturations were monitored continuously. The                            CF [id number] #[id number] was introduced through the anus                            and advanced to the the terminal ileum. The                             colonoscopy was performed without difficulty. The                            patient tolerated the procedure well. The quality                            of the bowel preparation was good. The terminal                            ileum, ileocecal valve, appendiceal orifice, and                            rectum were photographed. Scope In: 8:14:46 AM Scope Out: 8:29:23 AM Scope Withdrawal Time: 0 hours 11 minutes 47 seconds  Total Procedure Duration: 0 hours 14 minutes 37 seconds  Findings:                 The perianal and digital rectal examinations were                            normal.                           Multiple small-mouthed diverticula were found in                            the sigmoid colon.                           Normal mucosa was found in the entire colon.                            Biopsies for histology were taken with a cold                            forceps from the right colon and left colon for  evaluation of microscopic colitis. Estimated blood                            loss was minimal.                           Retroflexion in the rectum was not performed due to                            anatomy (narrow, short rectal vault). Anterograde                            views were otherwise normal.                           The terminal ileum appeared normal. Complications:            No immediate complications. Estimated Blood Loss:     Estimated blood loss was minimal. Impression:               - Diverticulosis in the sigmoid colon.                           - Normal mucosa in the entire examined colon.                            Biopsied.                           - The examined portion of the ileum was normal. Recommendation:           - Patient has a contact number available for                            emergencies. The signs and symptoms of potential                            delayed complications were  discussed with the                            patient. Return to normal activities tomorrow.                            Written discharge instructions were provided to the                            patient.                           - Resume previous diet.                           - Resume Eliquis  (apixaban ) at prior dose tomorrow.                           - Await pathology results.                           -  Repeat colonoscopy is not recommended due to                            current age (7 years or older) for screening                            purposes.                           - Return to GI clinic PRN. Sandor Flatter, MD 01/25/2024 8:50:48 AM

## 2024-01-25 NOTE — Progress Notes (Signed)
 Called to room to assist during endoscopic procedure.  Patient ID and intended procedure confirmed with present staff. Received instructions for my participation in the procedure from the performing physician.

## 2024-01-25 NOTE — Patient Instructions (Addendum)
 Resume previous diet.                           - Resume Eliquis  (apixaban ) at prior dose tomorrow.                           - Await pathology results.                           - Repeat colonoscopy is not recommended due to                            current age (65 years or older) for screening                            purposes.  Resume previous diet.                           - Continue present medications.                           - Await pathology results.                           - Increase Protonix  (pantoprazole ) to 40 mg PO BID                            for 6 weeks to promote mucosal healing of                            gastritis, then can reduce back to 40 mg daily for                            ongoing reflux control.   YOU HAD AN ENDOSCOPIC PROCEDURE TODAY AT THE [facility]:   Refer to the procedure report that was given to you for any specific questions about what was found during the examination.  If the procedure report does not answer your questions, please call your gastroenterologist to clarify.  If you requested that your care partner not be given the details of your procedure findings, then the procedure report has been included in a sealed envelope for you to review at your convenience later.  YOU SHOULD EXPECT: Some feelings of bloating in the abdomen. Passage of more gas than usual.  Walking can help get rid of the air that was put into your GI tract during the procedure and reduce the bloating. If you had a lower endoscopy (such as a colonoscopy or flexible sigmoidoscopy) you may notice spotting of blood in your stool or on the toilet paper. If you underwent a bowel prep for your procedure, you may not have a normal bowel movement for a few days.  Please Note:  You might notice some irritation and congestion in your nose or some drainage.  This is from the oxygen used during your procedure.  There is no need for concern and it should clear up in a day or  so.  SYMPTOMS TO REPORT IMMEDIATELY:  Following lower endoscopy (colonoscopy or flexible sigmoidoscopy):  Excessive amounts of blood in the stool  Significant tenderness or worsening of abdominal pains  Swelling of the abdomen that is new, acute  Fever of 100F or higher  Following upper endoscopy (EGD)  Vomiting of blood or coffee ground material  New chest pain or pain under the shoulder blades  Painful or persistently difficult swallowing  New shortness of breath  Fever of 100F or higher  Black, tarry-looking stools  For urgent or emergent issues, a gastroenterologist can be reached at any hour by calling [phone]. Do not use MyChart messaging for urgent concerns.    DIET:  We do recommend a small meal at first, but then you may proceed to your regular diet.  Drink plenty of fluids but you should avoid alcoholic beverages for 24 hours.  ACTIVITY:  You should plan to take it easy for the rest of today and you should NOT DRIVE or use heavy machinery until tomorrow (because of the sedation medicines used during the test).    FOLLOW UP: Our staff will call the number listed on your records the next business day following your procedure.  We will call around 7:15- 8:00 am to check on you and address any questions or concerns that you may have regarding the information given to you following your procedure. If we do not reach you, we will leave a message.     If any biopsies were taken you will be contacted by phone or by letter within the next 1-3 weeks.  Please call us  at [phone] if you have not heard about the biopsies in 3 weeks.    SIGNATURES/CONFIDENTIALITY: You and/or your care partner have signed paperwork which will be entered into your electronic medical record.  These signatures attest to the fact that that the information above on your After Visit Summary has been reviewed and is understood.  Full responsibility of the confidentiality of this discharge  information lies with you and/or your care-partner.

## 2024-01-26 ENCOUNTER — Telehealth: Payer: Self-pay

## 2024-01-26 ENCOUNTER — Encounter: Payer: Self-pay | Admitting: Physician Assistant

## 2024-01-26 ENCOUNTER — Ambulatory Visit: Attending: Physician Assistant | Admitting: Physician Assistant

## 2024-01-26 VITALS — BP 123/72 | HR 68 | Ht 63.0 in | Wt 130.8 lb

## 2024-01-26 DIAGNOSIS — Z5181 Encounter for therapeutic drug level monitoring: Secondary | ICD-10-CM

## 2024-01-26 DIAGNOSIS — I480 Paroxysmal atrial fibrillation: Secondary | ICD-10-CM

## 2024-01-26 DIAGNOSIS — I951 Orthostatic hypotension: Secondary | ICD-10-CM

## 2024-01-26 DIAGNOSIS — D6869 Other thrombophilia: Secondary | ICD-10-CM

## 2024-01-26 DIAGNOSIS — Z79899 Other long term (current) drug therapy: Secondary | ICD-10-CM

## 2024-01-26 NOTE — Telephone Encounter (Signed)
 Attempted to reach patient for follow up phone call. No answer, left voicemail to contact Dr. Andre Kawasaki office with any questions or concerns.

## 2024-01-26 NOTE — Patient Instructions (Signed)
 Medication Instructions:   Your physician recommends that you continue on your current medications as directed. Please refer to the Current Medication list given to you today.   *If you need a refill on your cardiac medications before your next appointment, please call your pharmacy*    Lab Work: NONE ORDERED  TODAY    If you have labs (blood work) drawn today and your tests are completely normal, you will receive your results only by: MyChart Message (if you have MyChart) OR A paper copy in the mail If you have any lab test that is abnormal or we need to change your treatment, we will call you to review the results.    Testing/Procedures: NONE ORDERED  TODAY     Follow-Up: At [facility], you and your health needs are our priority.  As part of our continuing mission to provide you with exceptional heart care, our providers are all part of one team.  This team includes your primary Cardiologist (physician) and Advanced Practice Providers or APPs (Physician Assistants and Nurse Practitioners) who all work together to provide you with the care you need, when you need it.  Your next appointment:   3 month(s) with Dr Almetta YOU WILL BE CONTACTED BY A SCHEDULAR (   CASSIE HALL/ ANGELINE HAMMER).    Provider:  We recommend signing up for the patient portal called MyChart.  Sign up information is provided on this After Visit Summary.  MyChart is used to connect with patients for Virtual Visits (Telemedicine).  Patients are able to view lab/test results, encounter notes, upcoming appointments, etc.  Non-urgent messages can be sent to your provider as well.   To learn more about what you can do with MyChart, go to [email or web address].   Other Instructions

## 2024-01-30 LAB — SURGICAL PATHOLOGY

## 2024-01-31 ENCOUNTER — Ambulatory Visit: Payer: Self-pay | Admitting: Gastroenterology

## 2024-02-18 ENCOUNTER — Other Ambulatory Visit: Payer: Self-pay | Admitting: Internal Medicine

## 2024-02-21 ENCOUNTER — Ambulatory Visit (INDEPENDENT_AMBULATORY_CARE_PROVIDER_SITE_OTHER): Admitting: Physician Assistant

## 2024-02-21 DIAGNOSIS — M1712 Unilateral primary osteoarthritis, left knee: Secondary | ICD-10-CM | POA: Diagnosis not present

## 2024-02-21 MED ORDER — BUPIVACAINE HCL 0.25 % IJ SOLN
5.0000 mL | INTRAMUSCULAR | Status: AC | PRN
Start: 2024-02-21 — End: 2024-02-21
  Administered 2024-02-21: 5 mL via INTRA_ARTICULAR

## 2024-02-21 MED ORDER — LIDOCAINE HCL 1 % IJ SOLN
5.0000 mL | INTRAMUSCULAR | Status: AC | PRN
  Administered 2024-02-21: 5 mL

## 2024-02-21 MED ORDER — METHYLPREDNISOLONE ACETATE 40 MG/ML IJ SUSP
40.0000 mg | INTRAMUSCULAR | Status: AC | PRN
  Administered 2024-02-21: 40 mg via INTRA_ARTICULAR

## 2024-02-21 NOTE — Progress Notes (Signed)
 Office Visit Note   Patient: [name]           Date of Birth: 09-May-1941           MRN: [id number] Visit Date: 02/21/2024              Requested by: Cleotilde Planas, MD [address] [place],  [place] [address] PCP: Cleotilde Planas, MD   Assessment & Plan: Visit Diagnoses:  1. Unilateral primary osteoarthritis, left knee     Plan: Impression is left knee osteoarthritis.  Today, we discussed various treatment options to include repeat cortisone injection versus viscosupplementation injection versus total knee replacement surgery.  She has elected to proceed with repeat cortisone injection today as she has had good relief in the past.  She will follow-up with us  as needed.  Call with concerns or questions.  Follow-Up Instructions: Return if symptoms worsen or fail to improve.   Orders:  Orders Placed This Encounter  Procedures   Large Joint Inj: R knee   No orders of the defined types were placed in this encounter.     Procedures: Large Joint Inj: L knee on 02/21/2024 9:11 AM Indications: pain Details: 22 G needle, anterolateral approach Medications: 5 mL lidocaine  1 %; 5 mL bupivacaine  0.25 %; 40 mg methylPREDNISolone  acetate 40 MG/ML      Clinical Data: No additional findings.   Subjective: Chief Complaint  Patient presents with   Left Knee - Pain    HPI patient is a pleasant 83 year old female who comes in today with recurrent left knee pain.  History of advanced tricompartmental osteoarthritis.  She has been seen in our office several times for this where she has undergone cortisone injections with the last one being in early April of this past year.  She had great relief until recently.  She is here today with recurrent pain.  She has pain to the entire knee.  Symptoms are intermittent but appear to be worse going from a seated to standing position as well as with stair climbing.  She has associated swelling and giving way sensations.  She has been taking  Tylenol  without significant relief.  Unable to take NSAIDs as she is on Eliquis .  Review of Systems as detailed in HPI.  All others reviewed and are negative.   Objective: Vital Signs: There were no vitals taken for this visit.  Physical Exam well-developed well-nourished female in no acute distress.  Alert and oriented x 3.  Ortho Exam left knee exam: Trace effusion.  Range of motion 0 to 115 degrees.  Moderate patellofemoral crepitus.  Medial and lateral joint line tenderness.  She is neurovascularly intact distally.  Specialty Comments:  May need valium pre-ESI  EXAM: MRI LUMBAR SPINE WITHOUT CONTRAST   TECHNIQUE: Multiplanar, multisequence MR imaging of the lumbar spine was performed. No intravenous contrast was administered.   COMPARISON:  MR lumbar 05/01/2015; X-ray lumbar 10/05/2021.   FINDINGS: Segmentation: Standard; the lowest formed disc space is designated L5-S1.   Alignment: There is mild levocurvature centered at L2. Trace anterolisthesis of L4 on L5 is unchanged. There is no other antero or retrolisthesis.   Vertebrae: There is mild compression deformity of the L3 vertebral body with up to approximately 10% loss of vertebral body height anteriorly, new since 2016, but without marrow edema to suggest acute or subacute fracture. The appearance is likely unchanged compared to the radiographs from 01/22/2019. The other vertebral body heights are preserved. Background marrow signal is normal. There  is a small intraosseous hemangioma in the right L5 vertebral body. There is no suspicious marrow signal abnormality or marrow edema.   Conus medullaris and cauda equina: Conus extends to the L1 level. Conus and cauda equina appear normal.   Paraspinal and other soft tissues: Unremarkable.   Disc levels:   There is mild multilevel disc desiccation and narrowing, overall progressed since 2016. There is multilevel facet arthropathy, most advanced at L4-L5 and worse  on the right at this level.   T12-L1: No significant spinal canal or neural foraminal stenosis   L1-L2: There is a mild disc bulge eccentric to the left and mild facet arthropathy resulting in mild left and no significant right neural foraminal stenosis and no significant spinal canal stenosis, not significantly changed.   L2-L3: There is a mild disc bulge and mild bilateral facet arthropathy resulting in mild right and no significant left neural foraminal stenosis without significant spinal canal stenosis minimally progressed since 2016.   L3-L4: There is a mild disc bulge and mild bilateral facet arthropathy resulting in mild narrowing of the left subarticular zone without evidence of frank nerve root impingement and mild left and no significant right neural foraminal stenosis slightly progressed since 2016.   L4-L5: There is a mild disc bulge and mild bilateral facet arthropathy with a trace right effusion without significant spinal canal or neural foraminal stenosis. The facet arthropathy has slightly progressed since 2016   L5-S1: There is a broad-based left subarticular/foraminal protrusion and mild bilateral facet arthropathy. The protrusion abuts but does not appear to impinge the traversing left S1 nerve root. There is mild left and no significant right neural foraminal stenosis. Findings are slightly progressed since 2016.   IMPRESSION: 1. Mild anterior compression deformity of the L3 vertebral body is new since 2016 but without marrow edema to suggest acute fracture, similar in appearance to the radiographs from 2020. Otherwise, no evidence of acute injury in the lumbar spine. 2. Multilevel facet arthropathy, most advanced at L4-L5 and worse on the right, slightly progressed since 2016. 3. Otherwise, overall mild multilevel degenerative changes as detailed above have slightly progressed since 2016, but with no high-grade spinal canal or neural foraminal stenosis,  and no evidence of nerve root impingement to explain the patient's right-sided pain. 4. Mild levoscoliosis centered at L2.     Electronically Signed By: Maude Harry M.D. On: 10/11/2021 10:04  Imaging: No results found.   PMFS History: Patient Active Problem List   Diagnosis Date Noted   Paroxysmal atrial fibrillation with RVR (HCC) 11/20/2023   Malnutrition of moderate degree 06/25/2022   Closed compression fracture of L2 lumbar vertebra, initial encounter (HCC) 06/21/2022   Intractable back pain 06/20/2022   High risk medication use 02/18/2019   Therapeutic drug monitoring 02/18/2019   Healthcare maintenance 02/18/2019   ILD (interstitial lung disease) (HCC) 11/28/2016   Multiple lung nodules on CT 11/28/2016   Abnormal laboratory test 11/18/2016   DDD L spine 11/18/2016   Scl-70 antibody positive 11/10/2016   History of hypothyroidism 11/10/2016   History of hemochromatosis 11/10/2016   Pulmonary emphysema (HCC) 08/31/2016   [name] phenomenon without gangrene 08/31/2016   Sinusitis, chronic 08/31/2016   Chronic cough 02/10/2016   Irritable larynx 02/10/2016   Stopped smoking with greater than 40 pack year history 02/10/2016   Chest pain, unspecified 10/06/2010   Shortness of breath 10/06/2010   Hypothyroidism    Hereditary hemochromatosis (HCC) 04/22/2010   ATRIAL FIBRILLATION 10/20/2008   PAC 10/20/2008  Syncope and collapse 10/20/2008   Past Medical History:  Diagnosis Date   Anxiety    hx of- no longer needs medication   Atrial fibrillation (HCC)    a. Flecainide  therapy;  b. event monitor 4/12   Chest pain    a. GXT myoview  4/12: no isch., EF 86%;   b. 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 Freddie   History of syncope    Hypothyroidism    Osteoporosis     Family History  Problem Relation Age of Onset   Stroke Mother    Heart  disease Father 49   Hemochromatosis Brother    Breast cancer Neg Hx    Colon cancer Neg Hx    Esophageal cancer Neg Hx    Rectal cancer Neg Hx    Stomach cancer Neg Hx     Past Surgical History:  Procedure Laterality Date   COLONOSCOPY     IR KYPHO LUMBAR INC FX REDUCE BONE BX UNI/BIL CANNULATION INC/IMAGING  06/30/2022   IR KYPHO LUMBAR INC FX REDUCE BONE BX UNI/BIL CANNULATION INC/IMAGING  08/16/2022   MOUTH SURGERY     SHOULDER ARTHROSCOPY Left    TOTAL ABDOMINAL HYSTERECTOMY     UPPER GASTROINTESTINAL ENDOSCOPY     VARICOSE VEIN SURGERY     Social History   Occupational History   Occupation: RETIRED    Employer: RETIRED   Occupation: retired  Tobacco Use   Smoking status: Former    Current packs/day: 0.00    Average packs/day: 1 pack/day for 42.0 years (42.0 ttl pk-yrs)    Types: Cigarettes    Start date: 06/28/1955    Quit date: 06/27/1997    Years since quitting: 26.6    Passive exposure: Never   Smokeless tobacco: Never  Vaping Use   Vaping status: Never Used  Substance and Sexual Activity   Alcohol  use: Yes    Comment: occ   Drug use: No   Sexual activity: Not on file

## 2024-03-05 ENCOUNTER — Other Ambulatory Visit: Payer: Self-pay

## 2024-03-05 MED ORDER — SPIRIVA RESPIMAT 2.5 MCG/ACT IN AERS: 2.0000 | INHALATION_SPRAY | Freq: Every day | RESPIRATORY_TRACT | 8 refills | Status: AC

## 2024-03-05 NOTE — Telephone Encounter (Signed)
 Received faxed refill request from [facility] regarding her Spiriva . Sent in refill, NFN.

## 2024-04-16 ENCOUNTER — Telehealth: Payer: Self-pay | Admitting: Physician Assistant

## 2024-04-16 NOTE — Telephone Encounter (Signed)
 Patient c/o Palpitations:  STAT if patient reporting lightheadedness, shortness of breath, or chest pain  How long have you had palpitations/irregular HR/ Afib? Are you having the symptoms now? Since Monday, no  Are you currently experiencing lightheadedness, SOB or CP? no  Do you have a history of afib (atrial fibrillation) or irregular heart rhythm? yes  Have you checked your BP or HR? (document readings if available): has not wrote them down  Are you experiencing any other symptoms? no

## 2024-04-16 NOTE — Telephone Encounter (Signed)
 Spoke with pt, she reports that she is out of rhythm and it has been happening more frequently than usual. Her heart rate is running 106 to 139 at the highest. Follow up scheduled per her request.

## 2024-04-17 NOTE — Progress Notes (Unsigned)
  Electrophysiology Office Note:   Date:  04/18/2024  ID:  [name], DOB October 17, 1940, MRN [id number]  Primary Cardiologist: None Electrophysiologist: Donnice DELENA Primus, MD   Electrophysiologist:  Donnice DELENA Primus, MD      History of Present Illness:   [name] is a 83 y.o. female with h/o AF, Secondary hypercoagulable state, syncope, and high risk medication monitoring - flecainide  seen today for acute visit due to recurrent AF.    Patient reports more fatigue over the past couple of months. Feels herself going in and out of AF almost daily. No missed doses of medication. Only really feels bad when her HRs are elevated. Otherwise, she denies chest pain, PND, orthopnea, nausea, vomiting, dizziness, syncope, edema, weight gain, or early satiety.   Review of systems complete and found to be negative unless listed in HPI.   EP Information / Studies Reviewed:    EKG is ordered today. Personal review as below.  EKG Interpretation Date/Time:  Thursday April 18 2024 08:27:08 EDT Ventricular Rate:  126 PR Interval:    QRS Duration:  84 QT Interval:  326 QTC Calculation: 472 R Axis:   266  Text Interpretation: Coarse atrial fib vs Atrial flutter with variable A-V block Possible Right ventricular hypertrophy Confirmed by [name] [phone]) on 04/18/2024 8:40:56 AM    Arrhythmia/Device History No specialty comments available.   Physical Exam:   VS:  BP 102/64   Pulse (!) 124   Ht 5' 3 (1.6 m)   Wt 131 lb (59.4 kg)   SpO2 95%   BMI 23.21 kg/m    Wt Readings from Last 3 Encounters:  04/18/24 131 lb (59.4 kg)  01/26/24 130 lb 12.8 oz (59.3 kg)  01/25/24 134 lb (60.8 kg)     GEN: No acute distress NECK: No JVD; No carotid bruits CARDIAC: Irregularly irregular rate and rhythm, no murmurs, rubs, gallops RESPIRATORY:  Clear to auscultation without rales, wheezing or rhonchi  ABDOMEN: Soft, non-tender, non-distended EXTREMITIES:  No edema; No deformity    ASSESSMENT AND PLAN:    Paroxysmal AF EKG today shows coarse AF vs atrial flutter with rapid rates Continue flecainide  100 mg BID Increase toprol  50 mg daily Continue eliquis  5 mg BID for now. Her weight is RIGHT on the border of dose decrease. With possible [facility] or ablation pending, will continue current dose for now so that she would not become sub-therapeutic.  CrCl is 48.42, would only qualify for 250 of Tikosyn. With thyroid  dysfunction and pulmonary fibrosis, would try and avoid amiodarone.   Suspect cardioversion is a poor option as she feels she is going in and out almost daily.   Risk, benefits, and alternatives to EP study and radiofrequency ablation for afib were also discussed in detail today. These risks include but are not limited to stroke, bleeding, vascular damage, tamponade, perforation, damage to the esophagus, lungs, and other structures, pulmonary vein stenosis, worsening renal function, and death. She would like to see Dr. Primus to discuss further.   Secondary hypercoagulable state Pt on Eliquis  as above   High risk medication monitoring - flecainide  Patient requires ongoing monitoring for anti-arrhythmic medication which has the potential to cause life threatening arrhythmias or AV block.  H/o Orthostatic syncope   No recurrence  Follow up with Dr. Primus to discuss ablation.   Signed, [name] Prentice Lesia, PA-C

## 2024-04-18 ENCOUNTER — Encounter: Payer: Self-pay | Admitting: Student

## 2024-04-18 ENCOUNTER — Ambulatory Visit: Attending: Student | Admitting: Student

## 2024-04-18 VITALS — BP 102/64 | HR 124 | Ht 63.0 in | Wt 131.0 lb

## 2024-04-18 DIAGNOSIS — Z5181 Encounter for therapeutic drug level monitoring: Secondary | ICD-10-CM

## 2024-04-18 DIAGNOSIS — Z79899 Other long term (current) drug therapy: Secondary | ICD-10-CM

## 2024-04-18 DIAGNOSIS — I951 Orthostatic hypotension: Secondary | ICD-10-CM

## 2024-04-18 DIAGNOSIS — D6869 Other thrombophilia: Secondary | ICD-10-CM

## 2024-04-18 DIAGNOSIS — I480 Paroxysmal atrial fibrillation: Secondary | ICD-10-CM | POA: Diagnosis not present

## 2024-04-18 MED ORDER — METOPROLOL SUCCINATE ER 50 MG PO TB24: 50.0000 mg | ORAL_TABLET | Freq: Every evening | ORAL | 3 refills | Status: DC

## 2024-04-18 MED ORDER — APIXABAN 5 MG PO TABS
5.0000 mg | ORAL_TABLET | Freq: Two times a day (BID) | ORAL | 3 refills | Status: AC
Start: 2024-04-18 — End: ?

## 2024-04-18 NOTE — Patient Instructions (Addendum)
 Medication Instructions:   START TAKING:  TOPROL  XL  50 MG  IN THE EVENING   *If you need a refill on your cardiac medications before your next appointment, please call your pharmacy*    Lab Work:   PLEASE GO DOWN STAIRS  [facility]  FIRST FLOOR   ( GET OFF ELEVATORS WALK TOWARDS WAITING AREA LAB LOCATED BY PHARMACY):  BMET AND CBC TODAY      If you have labs (blood work) drawn today and your tests are completely normal, you will receive your results only by: MyChart Message (if you have MyChart) OR A paper copy in the mail If you have any lab test that is abnormal or we need to change your treatment, we will call you to review the results.  Testing/Procedures: NONE ORDERED  TODAY    Follow-Up: At [facility], you and your health needs are our priority.  As part of our continuing mission to provide you with exceptional heart care, our providers are all part of one team.  This team includes your primary Cardiologist (physician) and Advanced Practice Providers or APPs (Physician Assistants and Nurse Practitioners) who all work together to provide you with the care you need, when you need it.  Your next appointment:  NEXT AVAILABLE    Provider:   Donnice Primus, MD    We recommend signing up for the patient portal called MyChart.  Sign up information is provided on this After Visit Summary.  MyChart is used to connect with patients for Virtual Visits (Telemedicine).  Patients are able to view lab/test results, encounter notes, upcoming appointments, etc.  Non-urgent messages can be sent to your provider as well.   To learn more about what you can do with MyChart, go to [email or web address].   Other Instructions

## 2024-04-19 ENCOUNTER — Ambulatory Visit: Payer: Self-pay | Admitting: Student

## 2024-04-19 LAB — BASIC METABOLIC PANEL WITH GFR
BUN/Creatinine Ratio: 16 (ref 12–28)
BUN: 12 mg/dL (ref 8–27)
CO2: 23 mmol/L (ref 20–29)
Calcium: 9.5 mg/dL (ref 8.7–10.3)
Chloride: 101 mmol/L (ref 96–106)
Creatinine, Ser: 0.73 mg/dL (ref 0.57–1.00)
Glucose: 97 mg/dL (ref 70–99)
Potassium: 4 mmol/L (ref 3.5–5.2)
Sodium: 140 mmol/L (ref 134–144)
eGFR: 82 mL/min/1.73 (ref >59)

## 2024-04-19 LAB — CBC
Hematocrit: 41.3 % (ref 34.0–46.6)
Hemoglobin: 13.5 g/dL (ref 11.1–15.9)
MCH: 35.6 pg — ABNORMAL HIGH (ref 26.6–33.0)
MCHC: 32.7 g/dL (ref 31.5–35.7)
MCV: 109 fL — ABNORMAL HIGH (ref 79–97)
Platelets: 246 x10E3/uL (ref 150–450)
RBC: 3.79 x10E6/uL (ref 3.77–5.28)
RDW: 12.3 % (ref 11.7–15.4)
WBC: 11.3 x10E3/uL — ABNORMAL HIGH (ref 3.4–10.8)

## 2024-04-24 ENCOUNTER — Other Ambulatory Visit: Payer: Self-pay | Admitting: Internal Medicine

## 2024-04-29 ENCOUNTER — Encounter: Payer: Self-pay | Admitting: Radiology

## 2024-05-03 ENCOUNTER — Other Ambulatory Visit: Payer: Self-pay | Admitting: Physician Assistant

## 2024-05-07 NOTE — Progress Notes (Unsigned)
 Office Visit Note  Patient: [name]             Date of Birth: 09-26-1940           MRN: [id number]             PCP: Cleotilde Planas, MD Referring: Cleotilde Planas, MD Visit Date: 05/21/2024 Occupation: Data Unavailable  Subjective:  New diagnosis of mild CHF   History of Present Illness: [name] is a 83 y.o. female with history of scleroderma and ILD.  Patient remains on Myfortic  360 mg 1 tablet by mouth twice daily, Bactrim  DS on Mondays, Wednesdays, and Fridays and prednisone  5 mg 1 tablet by mouth daily by Dr. Geronimo.  She is tolerating these medications without any side effects.  Patient reports that she was recently treated with a course of antibiotics and a steroid taper due to recovering from bronchitis.  Patient states that shortly afterwards she noticed increased weight gain and fluid retention especially in her face.  She was evaluated by Dr. Geronimo who obtained a BMP which was elevated as well as had an updated high-resolution chest CT on 05/18/2024.  According to the patient she has been diagnosed with mild CHF and is scheduled to see cardiology today to establish care.  Dr. Geronimo prescribed Lasix  which she took on Friday and states that she lost 14 pounds of fluid within a 24-hour period.  Patient states that her blood pressure has been well-controlled.  She denies any hospitalizations. Patient states about 10 days ago she had an episode of [name] affecting the right thumb which lasted for several days.  Patient states that she had discoloration and was trying to keep the finger warm and tried to massage it for circulation. She denies any digital ulcers.  She denies any increased skin tightness or thickening. She denies any recent rashes.  She has intermittent arthralgias and myalgias.  She denies any joint swelling.  She requested a refill of methocarbamol  which she takes as needed 12 alleviate myalgias and muscle spasms. Patient has occasional episodes of  reflux for which she will take a second dose of Protonix  for relief.  She denies any dysphagia.  She denies any other new or worsening gastrointestinal symptoms.    Activities of Daily Living:  Patient reports morning stiffness for 5 minutes.   Patient Denies nocturnal pain.  Difficulty dressing/grooming: Denies Difficulty climbing stairs: Reports Difficulty getting out of chair: Denies Difficulty using hands for taps, buttons, cutlery, and/or writing: Reports  Review of Systems  Constitutional:  Positive for fatigue.  HENT:  Positive for mouth dryness. Negative for mouth sores.   Eyes:  Negative for dryness.  Respiratory:  Positive for shortness of breath.   Cardiovascular:  Positive for chest pain and palpitations.  Gastrointestinal:  Negative for blood in stool, constipation and diarrhea.  Endocrine: Positive for increased urination.  Genitourinary:  Negative for involuntary urination.  Musculoskeletal:  Positive for morning stiffness. Negative for joint pain, gait problem, joint pain, joint swelling, myalgias, muscle weakness, muscle tenderness and myalgias.  Skin:  Positive for sensitivity to sunlight. Negative for color change, rash and hair loss.  Allergic/Immunologic: Negative for susceptible to infections.  Neurological:  Negative for dizziness and headaches.  Hematological:  Negative for swollen glands.  Psychiatric/Behavioral:  Positive for sleep disturbance. Negative for depressed mood. The patient is not nervous/anxious.     PMFS History:  Patient Active Problem List   Diagnosis Date Noted   Paroxysmal atrial fibrillation with  RVR (HCC) 11/20/2023   Malnutrition of moderate degree 06/25/2022   Closed compression fracture of L2 lumbar vertebra, initial encounter (HCC) 06/21/2022   Intractable back pain 06/20/2022   High risk medication use 02/18/2019   Therapeutic drug monitoring 02/18/2019   Healthcare maintenance 02/18/2019   ILD (interstitial lung disease) (HCC)  11/28/2016   Multiple lung nodules on CT 11/28/2016   Abnormal laboratory test 11/18/2016   DDD L spine 11/18/2016   Scl-70 antibody positive 11/10/2016   History of hypothyroidism 11/10/2016   History of hemochromatosis 11/10/2016   Pulmonary emphysema (HCC) 08/31/2016   [name] phenomenon without gangrene 08/31/2016   Sinusitis, chronic 08/31/2016   Chronic cough 02/10/2016   Irritable larynx 02/10/2016   Stopped smoking with greater than 40 pack year history 02/10/2016   Chest pain, unspecified 10/06/2010   Shortness of breath 10/06/2010   Hypothyroidism    Hereditary hemochromatosis 04/22/2010   ATRIAL FIBRILLATION 10/20/2008   PAC 10/20/2008   Syncope and collapse 10/20/2008    Past Medical History:  Diagnosis Date   Anxiety    hx of- no longer needs medication   Atrial fibrillation (HCC)    a. Flecainide  therapy;  b. event monitor 4/12   Chest pain    a. GXT myoview  4/12: no isch., EF 86%;   b. 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 Freddie   History of syncope    Hypothyroidism    Osteoporosis     Family History  Problem Relation Age of Onset   Stroke Mother    Heart disease Father 72   Hemochromatosis Brother    Breast cancer Neg Hx    Colon cancer Neg Hx    Esophageal cancer Neg Hx    Rectal cancer Neg Hx    Stomach cancer Neg Hx    Past Surgical History:  Procedure Laterality Date   COLONOSCOPY     IR KYPHO LUMBAR INC FX REDUCE BONE BX UNI/BIL CANNULATION INC/IMAGING  06/30/2022   IR KYPHO LUMBAR INC FX REDUCE BONE BX UNI/BIL CANNULATION INC/IMAGING  08/16/2022   MOUTH SURGERY     SHOULDER ARTHROSCOPY Left    TOTAL ABDOMINAL HYSTERECTOMY     UPPER GASTROINTESTINAL ENDOSCOPY     VARICOSE VEIN SURGERY     Social History   Tobacco Use   Smoking status: Former    Current packs/day: 0.00    Average packs/day: 1 pack/day for 42.0  years (42.0 ttl pk-yrs)    Types: Cigarettes    Start date: 06/28/1955    Quit date: 06/27/1997    Years since quitting: 26.9    Passive exposure: Never   Smokeless tobacco: Never  Vaping Use   Vaping status: Never Used  Substance Use Topics   Alcohol  use: Yes    Comment: occ   Drug use: No   Social History   Social History Narrative   REGULAR EXERCISE     Immunization History  Administered Date(s) Administered    sv, Bivalent, Protein Subunit Rsvpref,pf (Abrysvo) 06/14/2023   Fluad Quad(high Dose 65+) 02/18/2019, 04/21/2022   Fluzone Influenza virus vaccine,trivalent (IIV3), split virus 03/27/2019   INFLUENZA, HIGH DOSE SEASONAL PF 08/29/2017, 04/26/2018, 04/29/2020, 05/12/2021   PFIZER(Purple Top)SARS-COV-2 Vaccination 07/22/2019, 08/12/2019, 03/11/2020   PNEUMOCOCCAL CONJUGATE-20 05/31/2023   Pfizer(Comirnaty)Fall Seasonal Vaccine 12 years and older 04/18/2024   Pneumococcal Conjugate-13 06/12/2014   Pneumococcal Polysaccharide-23 06/27/2010, 04/25/2018  Tdap 03/16/2011, 07/05/2019   Zoster Recombinant(Shingrix) 04/08/2019   Zoster, Live 06/27/2010, 04/08/2019     Objective: Vital Signs: BP 97/65   Pulse 99   Temp 97.7 F (36.5 C)   Resp 14   Ht 5' 3.5 (1.613 m)   Wt 130 lb 3.2 oz (59.1 kg)   BMI 22.70 kg/m    Physical Exam Vitals and nursing note reviewed.  Constitutional:      Appearance: She is well-developed.  HENT:     Head: Normocephalic and atraumatic.  Eyes:     Conjunctiva/sclera: Conjunctivae normal.  Cardiovascular:     Rate and Rhythm: Normal rate and regular rhythm.     Heart sounds: Normal heart sounds.  Pulmonary:     Effort: Pulmonary effort is normal.     Breath sounds: Normal breath sounds.  Abdominal:     General: Bowel sounds are normal.     Palpations: Abdomen is soft.  Musculoskeletal:     Cervical back: Normal range of motion.  Lymphadenopathy:     Cervical: No cervical adenopathy.  Skin:    General: Skin is warm and dry.      Capillary Refill: Capillary refill takes less than 2 seconds.  Neurological:     Mental Status: She is alert and oriented to person, place, and time.  Psychiatric:        Behavior: Behavior normal.      Musculoskeletal Exam: C-spine has limited ROM with lateral rotation. Thoracic kyphosis. No midline spinal tenderness.  No SI joint tenderness.  Shoulder joints, elbow joints, wrist joints, MCPs, PIPs, DIPs have good range of motion with no synovitis.  Mild CMC joint, PIP, and DIP thickening.  Complete fist formation bilaterally.  Knee joints have good range of motion no warmth or effusion.  Ankle joints have good range of motion no tenderness or joint swelling.    CDAI Exam: CDAI Score: -- Patient Global: --; Provider Global: -- Swollen: --; Tender: -- Joint Exam 05/21/2024   No joint exam has been documented for this visit   There is currently no information documented on the homunculus. Go to the Rheumatology activity and complete the homunculus joint exam.  Investigation: No additional findings.  Imaging: CT Chest High Resolution Result Date: 05/20/2024 CLINICAL DATA:  Diffuse/interstitial lung disease. EXAM: CT CHEST WITHOUT CONTRAST TECHNIQUE: Multidetector CT imaging of the chest was performed following the standard protocol without intravenous contrast. High resolution imaging of the lungs, as well as inspiratory and expiratory imaging, was performed. RADIATION DOSE REDUCTION: This exam was performed according to the departmental dose-optimization program which includes automated exposure control, adjustment of the mA and/or kV according to patient size and/or use of iterative reconstruction technique. COMPARISON:  11/20/2023 and 06/20/2022. FINDINGS: Cardiovascular: Atherosclerotic calcification of the aorta, aortic valve and coronary arteries. Enlarged pulmonic trunk and heart. No pericardial effusion. Mediastinum/Nodes: No pathologically enlarged mediastinal or axillary lymph  nodes. Hilar regions are difficult to definitively evaluate without IV contrast. Esophagus is grossly unremarkable. Lungs/Pleura: Centrilobular and paraseptal emphysema. Minimal peribronchovascular ground-glass in the right middle lobe, likely infectious/inflammatory in etiology. Mild basilar predominant subpleural reticular densities and ground-glass. Suspect superimposed septal thickening in the lung bases with tiny bilateral pleural effusions. 4 mm posterior left lower lobe nodule (5/78), unchanged from 06/20/2022 and benign. Airway is unremarkable. No air trapping. Upper Abdomen: Visualized portions of the liver, gallbladder, adrenal glands, kidneys, spleen, pancreas, stomach and bowel are grossly unremarkable. No upper abdominal adenopathy. Musculoskeletal: Degenerative changes in the spine. Vertebral body  augmentations. Osteopenia. T10 sclerosis may be due to mild compression, unchanged. Old sternal fracture. IMPRESSION: 1. Suspect mild basilar interstitial lung abnormality or interstitial lung disease. Findings are categorized as probable UIP per consensus guidelines: Diagnosis of Idiopathic Pulmonary Fibrosis: An Official ATS/ERS/JRS/ALAT Clinical Practice Guideline. Am JINNY Honey Crit Care Med Vol 198, Iss 5, [id number], Feb 25 2017. 2. Favor mild congestive heart failure. 3. Aortic atherosclerosis (ICD10-I70.0). Coronary artery calcification. 4. Enlarged pulmonic trunk, indicative of pulmonary arterial hypertension. 5.  Emphysema (ICD10-J43.9). Electronically Signed   By: Newell Eke M.D.   On: 05/20/2024 11:53   DG Chest 2 View Result Date: 05/09/2024 CLINICAL DATA:  Cough, interstitial lung disease EXAM: CHEST - 2 VIEW COMPARISON:  Chest x-ray performed Nov 20, 2023 FINDINGS: Diffuse interstitial prominence is present which is modestly increased when compared to the previous exam. There is mild blunting of the left costophrenic angle. Heart size is not significantly changed. Degenerative changes are  present in the thoracolumbar spine with sites of previous vertebral augmentation, similar. Underlying changes of osteopenia. IMPRESSION: 1. Interstitial airspace opacities are more prominent when compared to the previous exam. Differential considerations include worsening interstitial lung disease as well as other etiologies such as edema. 2. Trace left pleural effusion. 3. Diffuse osteopenia. Electronically Signed   By: Maude Naegeli M.D.   On: 05/09/2024 14:54    Recent Labs: Lab Results  Component Value Date   WBC 7.4 05/14/2024   HGB 12.1 05/14/2024   PLT 215.0 05/14/2024   NA 139 05/14/2024   K 4.1 05/14/2024   CL 102 05/14/2024   CO2 30 05/14/2024   GLUCOSE 109 (H) 05/14/2024   BUN 8 05/14/2024   CREATININE 0.76 05/14/2024   BILITOT 0.7 05/14/2024   ALKPHOS 55 05/14/2024   AST 16 05/14/2024   ALT 16 05/14/2024   PROT 6.0 05/14/2024   ALBUMIN 3.9 05/14/2024   CALCIUM  8.5 05/14/2024   GFRAA 101 06/09/2020   QFTBGOLDPLUS NEGATIVE 08/10/2017    Speciality Comments: Reclast  IV August 23, 2022  Procedures:  No procedures performed Allergies: Patient has no known allergies.     Assessment / Plan:     Visit Diagnoses: Scleroderma (HCC) - HX of nailbed capillary changes, anticardiolipin antibody+, Scl-70+, ILD: She remains on Myfortic  360 mg 1 tablet by mouth twice daily, Bactrim  DS on Mondays, Wednesdays, and Fridays and prednisone  5 mg 1 tablet by mouth daily by Dr. Geronimo. Patient had a high-resolution chest CT on 05/18/2024 which revealed mild basilar interstitial lung abnormality or interstitial lung disease.  Mild congestive heart failure noted.  Enlarged pulmonary trunk indicative of pulmonary arterial hypertension. Patient will be establishing care with Dr. Almetta today.   Patient has intermittent symptoms of [name] phenomenon.  No increased skin tightness or thickening.  Sclerodactyly noted.  No digital ulcerations noted.  About 10 days ago she experienced an  episode of [name] affecting the right thumb which lasted for several days.  She tried to massage the digit as well as applied heat to help to alleviate the symptoms. Blood pressure was 97/65 today in the office.  Discussed the importance of keeping her blood pressure well-controlled due to the concern for renal cell crisis in patients with scleroderma. Patient has occasional exacerbations of reflux at which time she will take Protonix  twice daily instead of once daily.  She has not had any other new or worsening gastrointestinal symptoms. She has no synovitis on examination today. No medication changes will be made today.  She is  advised to notify us  if she develops any new or worsening symptoms.  She will follow-up in the office in 6 months or sooner if needed.   Anticardiolipin antibody positive - Low titer positive anticardiolipin antibodies.  10/21/2022: Anticardiolipin IgA was positive, beta-2  GP 1 IgA was positive.  ILD (interstitial lung disease) [facility]):  Under the care of Dr. Geronimo. No new or worsening pulmonary symptoms.   High resolution chest CT 10/04/23:  Mild basilar predominant subpleural reticular densities and ground-glass with questionable bronchiolectasis, similar to 06/20/2022. High-resolution chest CT updated on 05/18/2024 revealed mild basilar interstitial abnormality or interstitial lung disease. PFTs updated on 07/06/23.  Patient remains on Myfortic  360 mg 1 tablet by mouth twice daily, Bactrim  DS on Mondays, Wednesdays, and Fridays and prednisone  5 mg 1 tablet by mouth daily.  She tried discontinuing prednisone  for 3 weeks in March 2025 but had a more frequent and severe cough.  She resumed prednisone  5 mg daily and the cough has improved.   Patient recently presented to Dr. Geronimo on 05/14/24 due to experiencing rapid weight gain and fluid retention especially in her face.  BNP was ordered which was 754 on 05/14/2024. She was prescribed Lasix  20 mg 1 tablet daily.  Patient  reports 14 pounds of weight loss within 24 hours.  She will be establishing care with cardiology today due to the concern for mild CHF noted on high-resolution chest CT from 05/18/2024.  High risk medication use - Myfortic  360 mg 1 tablet by mouth twice daily, Bactrim  DS on Mondays, Wednesdays, and Fridays and prednisone  5 mg 1 tablet by mouth daily by Dr. Geronimo. CBC and CMP updated on 05/14/24.  She will continue to require updated lab work every 3 months.  Hgb A1c 5.6% on 05/14/24.   Long term systemic steroid user - Prednisone  5 mg 1 tablet by mouth daily for ILD-prescribed by Dr. Geronimo. Unable to taper due to chronic cough.  Hemoglobin A1c 5.6% on 05/14/2024. Last Reclast  infusion was administered in February 2025.  Primary osteoarthritis of both hands: She has PIP and DIP thickening consistent with osteoarthritis of both hands.  No synovitis noted.  She is able to make a complete fist bilaterally.  Trochanteric bursitis of both hips: Intermittent discomfort.   Spondylosis of lumbar spine: Intermittent discomfort. No symptoms of radiculopathy.  Intermittent muscle spasms-a refill of robaxin  was sent to the pharmacy for 500 mg 1 tablet daily as needed for muscle spasms.   Age-related osteoporosis without current pathological fracture - 05/02/2022 T-score -2.9, BMD 0.528 LFN, no comparison. DEXA  11/04/2019 T score -2.9 LFN,BMD 0.532.  She receives IV Reclast  under the care of Dr. Cleotilde.  Medication monitoring encounter - Reclast  IV infusion-end of February 2025- under care of Dr. Cleotilde.  Other closed fracture of lumbar vertebra, L1, L2 sequela - - L2 compression fracture December 2023, L1 compression fracture 2024 s/p kyphoplasty x 2.  Other medical conditions are listed as follows:  Multiple lung nodules on CT  History of atrial fibrillation  History of emphysema (HCC)  Former smoker  Vitamin D  deficiency  History of hemochromatosis  History of  hypothyroidism  Orders: No orders of the defined types were placed in this encounter.  Meds ordered this encounter  Medications   methocarbamol  (ROBAXIN ) 500 MG tablet    Sig: Take 1 tablet (500 mg total) by mouth daily as needed for muscle spasms.    Dispense:  30 tablet    Refill:  0    Follow-Up Instructions:  Return in about 6 months (around 11/18/2024).   Waddell CHRISTELLA Craze, PA-C  Note - This record has been created using Dragon software.  Chart creation errors have been sought, but may not always  have been located. Such creation errors do not reflect on  the standard of medical care.

## 2024-05-08 ENCOUNTER — Ambulatory Visit (INDEPENDENT_AMBULATORY_CARE_PROVIDER_SITE_OTHER)

## 2024-05-08 ENCOUNTER — Encounter: Payer: Self-pay | Admitting: Pulmonary Disease

## 2024-05-08 ENCOUNTER — Ambulatory Visit: Admitting: Pulmonary Disease

## 2024-05-08 ENCOUNTER — Ambulatory Visit: Payer: Self-pay | Admitting: Internal Medicine

## 2024-05-08 VITALS — BP 120/84 | HR 49 | Ht 63.5 in | Wt 141.0 lb

## 2024-05-08 DIAGNOSIS — R053 Chronic cough: Secondary | ICD-10-CM | POA: Diagnosis not present

## 2024-05-08 DIAGNOSIS — J439 Emphysema, unspecified: Secondary | ICD-10-CM

## 2024-05-08 DIAGNOSIS — J441 Chronic obstructive pulmonary disease with (acute) exacerbation: Secondary | ICD-10-CM | POA: Diagnosis not present

## 2024-05-08 MED ORDER — BREZTRI AEROSPHERE 160-9-4.8 MCG/ACT IN AERO: INHALATION_SPRAY | RESPIRATORY_TRACT | Status: DC

## 2024-05-08 NOTE — Addendum Note (Signed)
 Addended by: ARMAND BURNARD SAUNDERS on: 05/08/2024 03:38 PM   Modules accepted: Orders

## 2024-05-08 NOTE — Patient Instructions (Addendum)
 To meet you  Use Breztri 2 puffs twice a day every day, rinse her mouth out with water after every use  Stop using Spiriva  while using Breztri  Lets see if this helps the cough, if not we need to get back to the drawing board.  Chest x-ray today for further evaluation  Return to clinic in 4 weeks with Dr. Geronimo

## 2024-05-08 NOTE — Progress Notes (Signed)
 @Patient  ID: [name], female    DOB: [birth year], 83 y.o.   MRN: [id number]  Chief Complaint  Patient presents with   Medical Management of Chronic Issues    Pt states dry  hard cough , SOB.    Referring provider: Cleotilde Planas, MD  HPI:   83 y.o. woman with very mild ILD possibly MCTD related, stable on images, significant emphysema on images here for acute visit with shortness of breath.  Multiple prior pulmonary notes reviewed.  Patient Dr. Geronimo.  Antibody SLC positive mild ILD.  In the bases.  I think some of the interlobular septal thickening is related to chronic respiratory changes.  Significant emphysematous changes noted in the upper lobes particularly.  On most recent CT scan 09/2023.  Here for an acute visit with chief complaint of cough and some shortness of breath.  Cough chief complaint.  Present now for months.  Not an acute issue.  She did not want a wait until January to see Dr. Geronimo.  There was as an acute visit.  It is dry.  Nonproductive.  Spasms or coughing fits.  Witnessed in the room.  No significant postnasal drip symptoms.  But she does endorse nasal congestion, sinus congestion.  Not improvement antibiotics and steroids, received a course about 2 weeks ago.  We discussed at length possibly related to emphysema does not adequately treated with prior course of steroids and antibiotics.  Offered additional antibiotics steroids which she declines.  Offered changing inhalers that she is amenable to.  Discussed GERD, she has history of GERD with symptoms well-controlled pantoprazole  but high suspicion for silent reflux contribute to cough, UA CS.  Questionaires / Pulmonary Flowsheets:   ACT:      No data to display          MMRC: mMRC Dyspnea Scale mMRC Score  02/23/2021  1:31 PM 2    Epworth:      No data to display          Tests:   FENO:  Lab Results  Component Value Date   NITRICOXIDE 12 02/10/2016    PFT:    Latest  Ref Rng & Units 07/06/2023   11:26 AM 12/14/2022   10:27 AM 07/08/2022    2:57 PM 04/12/2022   10:27 AM 09/09/2021   10:46 AM 08/18/2020    2:49 PM 02/28/2020    4:16 PM  PFT Results  FVC-Pre L 2.57  2.57  2.52  2.48  2.67  2.78  2.90   FVC-Predicted Pre % 102  102  99  96  103  105  108   Pre FEV1/FVC % % 71  70  73  72  68  68  66   FEV1-Pre L 1.83  1.80  1.84  1.80  1.81  1.88  1.92   FEV1-Predicted Pre % 98  96  97  93  94  96  96   DLCO uncorrected ml/min/mmHg 11.05  11.48  11.51  9.26  11.33  11.74  11.88   DLCO UNC% % 60  62  61  49  60  62  63   DLCO corrected ml/min/mmHg 11.05  11.78  11.93  9.26  12.46  11.74  11.88   DLCO COR %Predicted % 60  63  64  49  66  62  63   DLVA Predicted % 66  71  73  57  70  65  66   Most recent 06/2023  personally reviewed interpreted this normal spirometry, DLCO moderately reduced  WALK:     05/26/2022    9:48 AM 04/21/2022    9:36 AM 09/09/2021   11:19 AM 02/23/2021    1:39 PM 03/03/2020   10:39 AM 10/22/2019   12:13 PM 07/04/2019   10:16 AM  SIX MIN WALK  Supplimental Oxygen during Test? (L/min) No No No No No No No  Tech Comments: average pace, only two laps due to increased fatigue, mild SOB PT walked at a moderate and steady pace, did not stop for any breaks, no c/o of SOB. pt walked at an average pace completing all required laps denying any complaints. pt walked at an average pace completing all required laps and denied any complaints. walked at a fast pace, no stops for rest or s/s.  tolerated walk well. Pt walked at an average pace completing all required laps having complaints of very mild SOB. Pt walked at an average pace completing all required laps having complaints of mild SOB.    Imaging: Personally reviewed and as per EMR in discussion this note No results found.  Lab Results: Personally reviewed and as per EMR in discussion of this note CBC    Component Value Date/Time   WBC 11.3 (H) 04/18/2024 0935   WBC 7.5 11/20/2023 1317    RBC 3.79 04/18/2024 0935   RBC 3.79 (L) 11/20/2023 1317   HGB 13.5 04/18/2024 0935   HGB 12.3 09/17/2012 1136   HCT 41.3 04/18/2024 0935   HCT 36.3 09/17/2012 1136   PLT 246 04/18/2024 0935   MCV 109 (H) 04/18/2024 0935   MCV 97.2 09/17/2012 1136   MCH 35.6 (H) 04/18/2024 0935   MCH 35.6 (H) 11/20/2023 1317   MCHC 32.7 04/18/2024 0935   MCHC 33.7 11/20/2023 1317   RDW 12.3 04/18/2024 0935   RDW 15.0 (H) 09/17/2012 1136   LYMPHSABS 0.9 10/19/2023 1050   LYMPHSABS 1.3 10/10/2016 0839   LYMPHSABS 1.5 09/17/2012 1136   MONOABS 0.5 10/19/2023 1050   MONOABS 0.4 09/17/2012 1136   EOSABS 0.1 10/19/2023 1050   EOSABS 0.4 10/10/2016 0839   BASOSABS 0.0 10/19/2023 1050   BASOSABS 0.1 10/10/2016 0839   BASOSABS 0.1 09/17/2012 1136    BMET    Component Value Date/Time   NA 140 04/18/2024 0935   NA 140 09/17/2012 1136   K 4.0 04/18/2024 0935   K 3.8 09/17/2012 1136   CL 101 04/18/2024 0935   CL 105 09/17/2012 1136   CO2 23 04/18/2024 0935   CO2 26 09/17/2012 1136   GLUCOSE 97 04/18/2024 0935   GLUCOSE 102 (H) 11/21/2023 0252   GLUCOSE 104 (H) 09/17/2012 1136   BUN 12 04/18/2024 0935   BUN 14.3 09/17/2012 1136   CREATININE 0.73 04/18/2024 0935   CREATININE 0.79 10/19/2023 1050   CREATININE 0.63 04/18/2022 1009   CREATININE 0.7 09/17/2012 1136   CALCIUM  9.5 04/18/2024 0935   CALCIUM  9.1 09/17/2012 1136   GFRNONAA >60 11/21/2023 0252   GFRNONAA >60 10/19/2023 1050   GFRNONAA 87 06/09/2020 0959   GFRAA 101 06/09/2020 0959    BNP    Component Value Date/Time   BNP 289.8 (H) 06/02/2016 0600    ProBNP No results found for: PROBNP  Specialty Problems       Pulmonary Problems   Shortness of breath   Chronic cough   Irritable larynx   Pulmonary emphysema (HCC)   08/14/2018-pulmonary function test- FVC-2.92 (108% predicted), ratio 67, FEV1 1.95 (96%  predicted), DLCO 13.19 (70% predicted)   02/18/2019-pulmonary function test-spirometry with DLCO-FVC 2.82 (104%  predicted), ratio 67, FEV1 1.89 (93% predicted), DLCO 15.31 (81% predicted)      Sinusitis, chronic   ILD (interstitial lung disease) (HCC)   Connective tissue work-up: 10/20/2016-anti-DNA antibody double-stranded-negative, anticardiolipin IgA-12, anticardiolipin IgG less than 14- Anticardiolipin IgM-47-low to medium positive Lupus anticoagulant eval-not detected Beta-2  glycoprotein IgG-less than 9, negative Beta-2  glycoprotein IgM-31 Beta-2  glycoprotein 18 IgA-less than 9, negative Pan ANCA-negative Cryo-globin-negative RF X DRV VT SCR with reflex- negative, D or VVT screen- 102   06/22/2017- connective tissue work-up- Anti-DNA antibody double-stranded- 2, negative Beta-2  glycoprotein antibodies-negative Cardiolipin antibodies IgG, IgM, IgA:  Anticardiolipin IgA-29-low to medium positive Anticardiolipin IgG-14, negative Anticardiolipin IgM-35-low to medium positive C3 and C4 component-negative Vitamin D - 31, optimal   07/18/2017-G6PD H-18.6, normal   07/18/2018-CT chest high-res-vaguely bandlike patchy consolidation and groundglass opacity at the periphery of the right upper and superior segment right lower lobes, new since 07/03/2017 chest CT, improving compared to 07/10/2018 chest x-ray, favoring resolving pneumonia with evolving postinfectious scarring, previously described basilar predominant subpleural reticulation and groundglass attenuation is minimal and appears decreased in the interval, no bronchiectasis or honeycombing, findings may represent an interstitial lung disease such as NSIP, less likely to favor early UIP, findings are indeterminate for UIP per consensus guidelines, moderate centrilobular emphysema and mild diffuse bronchial wall thickening suggesting COPD  08/14/2018-pulmonary function test- FVC-2.92 (108% predicted), ratio 67, FEV1 1.95 (96% predicted), DLCO 13.19 (70% predicted)   02/18/2019-pulmonary function test-spirometry with DLCO-FVC 2.82 (104% predicted),  ratio 67, FEV1 1.89 (93% predicted), DLCO 15.31 (81% predicted)       Multiple lung nodules on CT    No Known Allergies  Immunization History  Administered Date(s) Administered    sv, Bivalent, Protein Subunit Rsvpref,pf (Abrysvo) 06/14/2023   Fluad Quad(high Dose 65+) 02/18/2019, 04/21/2022   Fluzone Influenza virus vaccine,trivalent (IIV3), split virus 03/27/2019   INFLUENZA, HIGH DOSE SEASONAL PF 08/29/2017, 04/26/2018, 04/29/2020, 05/12/2021   PFIZER(Purple Top)SARS-COV-2 Vaccination 07/22/2019, 08/12/2019, 03/11/2020   PNEUMOCOCCAL CONJUGATE-20 05/31/2023   Pfizer(Comirnaty)Fall Seasonal Vaccine 12 years and older 04/18/2024   Pneumococcal Conjugate-13 06/12/2014   Pneumococcal Polysaccharide-23 06/27/2010, 04/25/2018   Tdap 03/16/2011, 07/05/2019   Zoster Recombinant(Shingrix) 04/08/2019   Zoster, Live 06/27/2010, 04/08/2019    Past Medical History:  Diagnosis Date   Anxiety    hx of- no longer needs medication   Atrial fibrillation (HCC)    a. Flecainide  therapy;  b. event monitor 4/12   Chest pain    a. GXT myoview  4/12: no isch., EF 86%;   b. 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 Freddie   History of syncope    Hypothyroidism    Osteoporosis     Tobacco History: Social History   Tobacco Use  Smoking Status Former   Current packs/day: 0.00   Average packs/day: 1 pack/day for 42.0 years (42.0 ttl pk-yrs)   Types: Cigarettes   Start date: 06/28/1955   Quit date: 06/27/1997   Years since quitting: 26.8   Passive exposure: Never  Smokeless Tobacco Never   Counseling given: Not Answered   Continue to not smoke  Outpatient Encounter Medications as of 05/08/2024  Medication Sig   acetaminophen  (TYLENOL ) 325 MG tablet Take 650 mg by mouth every 6 (six) hours as needed for mild pain or headache.   apixaban  (ELIQUIS )  5 MG TABS tablet Take 1  tablet (5 mg total) by mouth 2 (two) times daily.   atorvastatin  (LIPITOR) 10 MG tablet Take 10 mg by mouth daily.   cetirizine (ZYRTEC) 10 MG tablet Take 10 mg by mouth daily. (Patient taking differently: Take 10 mg by mouth as needed for allergies or rhinitis.)   cyanocobalamin  (VITAMIN B12) 500 MCG tablet Take 500 mcg by mouth daily.   flecainide  (TAMBOCOR ) 100 MG tablet TAKE 1 TABLET(100 MG) BY MOUTH TWICE DAILY   folic acid  (FOLVITE ) 1 MG tablet Take 1 mg by mouth daily.   levothyroxine  (SYNTHROID , LEVOTHROID) 112 MCG tablet Take 112 mcg by mouth daily.   methocarbamol  (ROBAXIN ) 500 MG tablet Take 1 tablet (500 mg total) by mouth daily as needed for muscle spasms.   metoprolol  succinate (TOPROL -XL) 50 MG 24 hr tablet Take 1 tablet (50 mg total) by mouth every evening.   Multiple Vitamins-Minerals (PRESERVISION AREDS 2) CAPS Take 1 capsule by mouth in the morning and at bedtime.   mycophenolate  (MYFORTIC ) 360 MG TBEC EC tablet Take 1 tablet (360 mg total) by mouth 2 (two) times daily.   ondansetron  (ZOFRAN ) 4 MG tablet Take 1 tablet (4 mg total) by mouth every 8 (eight) hours as needed for nausea or vomiting. (Patient taking differently: Take 4 mg by mouth as needed for nausea or vomiting.)   pantoprazole  (PROTONIX ) 40 MG tablet Take 1 tablet (40 mg total) by mouth 2 (two) times daily. Take twice daily for 6 weeks , then reduce back to 40mg  daily (Patient taking differently: Take 40 mg by mouth daily. Take twice daily for 6 weeks , then reduce back to 40mg  daily)   Polyethyl Glycol-Propyl Glycol (SYSTANE) 0.4-0.3 % SOLN 1 drop in each eye Ophthalmic as needed   predniSONE  (DELTASONE ) 5 MG tablet TAKE 1 TABLET(5 MG) BY MOUTH DAILY WITH BREAKFAST   sulfamethoxazole -trimethoprim  (BACTRIM  DS) 800-160 MG tablet TAKE 1 TABLET BY MOUTH ON MONDAY, WEDNESDAY AND FRIDAY AS DIRECTED   Tiotropium Bromide  Monohydrate (SPIRIVA  RESPIMAT) 2.5 MCG/ACT AERS Inhale 2 puffs into the lungs daily.   Vitamin D ,  Cholecalciferol , 25 MCG (1000 UT) TABS Take 1 tablet by mouth daily.   No facility-administered encounter medications on file as of 05/08/2024.     Review of Systems  Review of Systems  No chest pain with exertion.  No orthopnea or PND.  Comprehensive review of systems otherwise negative. Physical Exam  BP 120/84   Pulse (!) 49   Ht 5' 3.5 (1.613 m)   Wt 141 lb (64 kg)   SpO2 98%   BMI 24.59 kg/m   Wt Readings from Last 5 Encounters:  05/08/24 141 lb (64 kg)  04/18/24 131 lb (59.4 kg)  01/26/24 130 lb 12.8 oz (59.3 kg)  01/25/24 134 lb (60.8 kg)  11/21/23 131 lb 2.8 oz (59.5 kg)    BMI Readings from Last 5 Encounters:  05/08/24 24.59 kg/m  04/18/24 23.21 kg/m  01/26/24 23.17 kg/m  01/25/24 23.74 kg/m  11/21/23 (P) 23.24 kg/m     Physical Exam General: Elderly, frail Eyes: EOMI no icterus Pulmonary: Clear to auscultation bilaterally, good air excursion Cardiovascular: Warm, no edema Abdomen: Nondistended   Assessment & Plan:   Chronic cough: Suspicious for worsening of underlying emphysema versus related to ILD present for months.   Would benefit from follow-up with primary pulmonologist.  Recommended treatment with repeat course of steroids, antibiotics but she declines not wanting to have more steroids, recent course of antibiotic  therapy without improvement per her report. Escalate inhaler regimen for emphysema to Breztri (via samples) from Spiriva .  Perform chest x-ray to evaluate worsening interstitial changes although August would be better evaluated with CT scan.  Fortunately, most recent CT scan showed stability spring 2025.  She does have some nasal congestion, consider escalation of allergy, nasal regimen in the future if no better and other workup nonrevealing.  ILD: Sounds like MCTD related.  Will resume home prednisone  5 mg daily after prednisone  taper.  Continue all other medications.  Return in about 4 weeks (around 06/05/2024) for f/u Dr.  Neva please.   Tara JONELLE Beals, MD 05/08/2024

## 2024-05-08 NOTE — Telephone Encounter (Signed)
 Appt made for today with Dr annella

## 2024-05-08 NOTE — Telephone Encounter (Signed)
   E2C2 Pulmonary Triage - Initial Assessment Questions "Chief Complaint (e.g., cough, sob, wheezing, fever, chills, sweat or additional symptoms) *Go to specific symptom protocol after initial questions. Sob with exertion, dry cough, chest tightness  "How long have symptoms been present?" 3 months  Have you tested for COVID or Flu? Note: If not, ask patient if a home test can be taken. If so, instruct patient to call back for positive results. No  MEDICINES:   "Have you used any OTC meds to help with symptoms?" No If yes, ask "What medications?" na  "Have you used your inhalers/maintenance medication?" Yes If yes, "What medications?" spiriva   If inhaler, ask "How many puffs and how often?" Note: Review instructions on medication in the chart. 2 puffs daily  OXYGEN: "Do you wear supplemental oxygen?" No If yes, "How many liters are you supposed to use?" no  "Do you monitor your oxygen levels?" Yes If yes, What is your reading (oxygen level) today? Not today  What is your usual oxygen saturation reading?  (Note: Pulmonary O2 sats should be 90% or greater) Upper 90s Copied from CRM #[id number]. Topic: Clinical - Red Word Triage >> May 08, 2024 10:50 AM Rilla NOVAK wrote: [facility] that prompted transfer to Nurse Triage: Cough and shortness of breath Reason for Disposition  [1] MILD difficulty breathing (e.g., minimal/no SOB at rest, SOB with walking, pulse < 100) AND [2] NEW-onset or WORSE than normal  Answer Assessment - Initial Assessment Questions No available appts with pulm. Scheduled with alternative provider, 05/08/24.  Advised call back or UC/ED if symptoms worsen.  1. RESPIRATORY STATUS: Describe your breathing? (e.g., wheezing, shortness of breath, unable to speak, severe coughing)      Shortness of breath, coughing/ dry cough, sob with exertion 2. ONSET: When did this breathing problem begin?      Months ago, getting worse 3. PATTERN Does the difficult  breathing come and go, or has it been constant since it started?      Comes and going 4. SEVERITY: How bad is your breathing? (e.g., mild, moderate, severe)      moderate 5. RECURRENT SYMPTOM: Have you had difficulty breathing before? If Yes, ask: When was the last time? and What happened that time?      no 6. CARDIAC HISTORY: Do you have any history of heart disease? (e.g., heart attack, angina, bypass surgery, angioplasty)      afib 7. LUNG HISTORY: Do you have any history of lung disease?  (e.g., pulmonary embolus, asthma, emphysema)     Pulmonary fibrous, copd 8. CAUSE: What do you think is causing the breathing problem?      unsure 9. OTHER SYMPTOMS: Do you have any other symptoms? (e.g., chest pain, cough, dizziness, fever, runny nose)     Denies fever, chills, n/v, chest pain, dizziness Allergies runny nose, chest tightness upper chest 10. O2 SATURATION MONITOR:  Do you use an oxygen saturation monitor (pulse oximeter) at home? If Yes, ask: What is your reading (oxygen level) today? What is your usual oxygen saturation reading? (e.g., 95%)       97%RA week ago,  No oxygen, spiriiva 2 puffs x1, no neb tx or rescue inhaler  12. TRAVEL: Have you traveled out of the country in the last month? (e.g., travel history, exposures)       no  Protocols used: Breathing Difficulty-A-AH

## 2024-05-12 ENCOUNTER — Encounter: Payer: Self-pay | Admitting: Internal Medicine

## 2024-05-12 ENCOUNTER — Encounter: Payer: Self-pay | Admitting: Pulmonary Disease

## 2024-05-13 ENCOUNTER — Telehealth: Payer: Self-pay | Admitting: *Deleted

## 2024-05-13 NOTE — Telephone Encounter (Signed)
 Converted from [employer] message:  Good afternoon,   Since my appointment last week my cough has increased in severity and I have swelling in my face and extremities. My concern is possible pleural effusion or something else. I'm not able to use the inhaler samples you gave me so today I resumed Spiriva . Can you please contact me as possible?  ---------------------------------------------------------------------------------------------------------------------------------------------  Increased cough and post nasal drip.  She states she could not use the sample inhaler, she found it too hard to use Dory).  She had swelling of face and extremities, under eyes and fingers for the past week.  She was on a 6 day taper of prednisone .  She did not notice any swelling while she was on the prednisone .  No swelling of lips or tongue.  Brezti made her feel unwell.  She wants to stick with the Spiriva .  Her heart doctor increased her metoprolol  from 25-50 mg.  She will address with cardiology.  She states her sob is worse than normal.  Sob with minimal exertion.  She can only check her oxygen level on her apple watch.   I advised her I would send a message to Dr. Annella as he is seeing patients in the office today and once we hear back form him we will message her back.   Dr. Annella, Please advise regarding the Breztri inhaler, staying on the Spiriva  inhaler instead or something other than Breztri and the swelling whether it may be related to [employer] or prednisone .  Thank you.

## 2024-05-13 NOTE — Telephone Encounter (Signed)
 I called and spoke with patient, provided recommendations per Dr. Annella.  I scheduled her to see Dr. Geronimo on 11/18 at 1:15 pm, advised to arrive by 1:00 pm for check in.  She verbalized understanding.  Nothing further needed.

## 2024-05-14 ENCOUNTER — Other Ambulatory Visit: Payer: Self-pay | Admitting: Internal Medicine

## 2024-05-14 ENCOUNTER — Encounter: Payer: Self-pay | Admitting: Internal Medicine

## 2024-05-14 ENCOUNTER — Ambulatory Visit (INDEPENDENT_AMBULATORY_CARE_PROVIDER_SITE_OTHER): Admitting: Internal Medicine

## 2024-05-14 ENCOUNTER — Ambulatory Visit: Payer: Self-pay | Admitting: Internal Medicine

## 2024-05-14 VITALS — BP 108/72 | HR 105 | Temp 97.9°F | Ht 63.0 in | Wt 140.2 lb

## 2024-05-14 DIAGNOSIS — J849 Interstitial pulmonary disease, unspecified: Secondary | ICD-10-CM

## 2024-05-14 DIAGNOSIS — Z79624 Long term (current) use of inhibitors of nucleotide synthesis: Secondary | ICD-10-CM

## 2024-05-14 DIAGNOSIS — R5383 Other fatigue: Secondary | ICD-10-CM | POA: Diagnosis not present

## 2024-05-14 DIAGNOSIS — R0609 Other forms of dyspnea: Secondary | ICD-10-CM

## 2024-05-14 DIAGNOSIS — R609 Edema, unspecified: Secondary | ICD-10-CM

## 2024-05-14 DIAGNOSIS — R053 Chronic cough: Secondary | ICD-10-CM | POA: Diagnosis not present

## 2024-05-14 DIAGNOSIS — R7989 Other specified abnormal findings of blood chemistry: Secondary | ICD-10-CM

## 2024-05-14 DIAGNOSIS — R3589 Other polyuria: Secondary | ICD-10-CM

## 2024-05-14 DIAGNOSIS — Z5181 Encounter for therapeutic drug level monitoring: Secondary | ICD-10-CM

## 2024-05-14 DIAGNOSIS — Z7952 Long term (current) use of systemic steroids: Secondary | ICD-10-CM

## 2024-05-14 LAB — COMPREHENSIVE METABOLIC PANEL WITH GFR
ALT: 16 U/L (ref 0–35)
AST: 16 U/L (ref 0–37)
Albumin: 3.9 g/dL (ref 3.5–5.2)
Alkaline Phosphatase: 55 U/L (ref 39–117)
BUN: 8 mg/dL (ref 6–23)
CO2: 30 meq/L (ref 19–32)
Calcium: 8.5 mg/dL (ref 8.4–10.5)
Chloride: 102 meq/L (ref 96–112)
Creatinine, Ser: 0.76 mg/dL (ref 0.40–1.20)
GFR: 72.75 mL/min (ref >60.00)
Glucose, Bld: 109 mg/dL — ABNORMAL HIGH (ref 70–99)
Potassium: 4.1 meq/L (ref 3.5–5.1)
Sodium: 139 meq/L (ref 135–145)
Total Bilirubin: 0.7 mg/dL (ref 0.2–1.2)
Total Protein: 6 g/dL (ref 6.0–8.3)

## 2024-05-14 LAB — CBC WITH DIFFERENTIAL/PLATELET
Basophils Absolute: 0.1 K/uL (ref 0.0–0.1)
Basophils Relative: 0.8 % (ref 0.0–3.0)
Eosinophils Absolute: 0.1 K/uL (ref 0.0–0.7)
Eosinophils Relative: 0.8 % (ref 0.0–5.0)
HCT: 36.2 % (ref 36.0–46.0)
Hemoglobin: 12.1 g/dL (ref 12.0–15.0)
Lymphocytes Relative: 7.5 % — ABNORMAL LOW (ref 12.0–46.0)
Lymphs Abs: 0.6 K/uL — ABNORMAL LOW (ref 0.7–4.0)
MCHC: 33.5 g/dL (ref 30.0–36.0)
MCV: 107.7 fl — ABNORMAL HIGH (ref 78.0–100.0)
Monocytes Absolute: 0.3 K/uL (ref 0.1–1.0)
Monocytes Relative: 4.7 % (ref 3.0–12.0)
Neutro Abs: 6.4 K/uL (ref 1.4–7.7)
Neutrophils Relative %: 86.2 % — ABNORMAL HIGH (ref 43.0–77.0)
Platelets: 215 K/uL (ref 150.0–400.0)
RBC: 3.36 Mil/uL — ABNORMAL LOW (ref 3.87–5.11)
RDW: 13.9 % (ref 11.5–15.5)
WBC: 7.4 K/uL (ref 4.0–10.5)

## 2024-05-14 LAB — PHOSPHORUS: Phosphorus: 3 mg/dL (ref 2.3–4.6)

## 2024-05-14 LAB — BRAIN NATRIURETIC PEPTIDE: Pro B Natriuretic peptide (BNP): 754 pg/mL — ABNORMAL HIGH (ref 0.0–100.0)

## 2024-05-14 LAB — MAGNESIUM: Magnesium: 1.8 mg/dL (ref 1.5–2.5)

## 2024-05-14 MED ORDER — POTASSIUM CHLORIDE CRYS ER 10 MEQ PO TBCR: 10.0000 meq | EXTENDED_RELEASE_TABLET | Freq: Every day | ORAL | 0 refills | Status: AC

## 2024-05-14 MED ORDER — FUROSEMIDE 20 MG PO TABS: 20.0000 mg | ORAL_TABLET | Freq: Every day | ORAL | 0 refills | Status: DC

## 2024-05-14 NOTE — Progress Notes (Signed)
 PCP [name],[name], PA-C   HPI  IOV 02/10/2016  Chief Complaint  Patient presents with   Pulmonary Consult    Pt referred by Dr. Sophronia for chronic cough x 1 year. Pt states she feels she has a tickle that is causing her dry cough. Pt states she has DOE when climbing stairs. Pt deneis CP/tightness.     83 year old female with hemochromatosis homozygous gene followed by Dr. Freddie (last phlebotomy many years ago and is on serial monitoring) with children and siblings with active disease. She has also atrial fibrillation followed by Dr. Fernande. Reports insidious onset of chronic cough in the last 1 year. It is stable since onset. It fluctuates between mild and severe in severity. It is mostly dry in quality. Mostly present in the daytime but sometimes also wakes her up at night. It is definitely not progressive. It is episodic and present every day. Aggravated by talking sometimes and then the throat feels dry associated with tickle in the throat and also sensation the cough is coming from the upper chest retrosternally and sometimes relieved by drinking water or chewing on a lozenge. Also aggravated by seasonal changes particularly in the spring and the fall which makes her think she has allergies. There is sometimes associated gag. She also reports nonspecific occasional wheezing and associated shortness of breath that is nonspecific but present with exertion and relieved by rest No other clear cut aggravating or relieving factors.   cough associated history  - Medications: She is not on fish oil or ACE inhibitors - Sinus drainage: She does admit to spring allergies. She did have something removed from her hard palate several years ago by ENT does not know details - Acid reflux: She says she is significant acid reflux and is on Prilosec. Without which she'll have significant symptoms - Pulmonary disease: FenO 12 02/10/2016  and normal. She denies any personal history of asthma or  pulmonary fibrosis of COPD or emphysema smoked one pack per day started smoking at age 17 and quit in 1999. Making a 42 pack smoking history. Chest x-ray 08/26/2014 personally visualized is clear - Tobacco :  reports that she quit smoking about 18 years ago. She has a 42.00 pack-year smoking history. She has never used smokeless tobacco.       OV 02/18/2016  Chief Complaint  Patient presents with   Follow-up    Pt here after PFT and HRCT. Pt denies changes in SOB and cough. Pt denies any new complaints at this time.    Follow-up chronic cough. This visit is to follow-up on test results of function test and high resolution CT chest which are described below9 results show isolated reduction in diffusion capacity which can be explained by possible early ILD and also emphysema. There are also new findings of nodule 6 mm bilaterally on lower lobes. She prefers a very cautious approach to this evaluation.     Pulmonary function test 02/12/2016 - FVC 2.8 cm/101%, FEV1 1.9 L/91%. Ratio 60/90%. Total lung capacity 115%. DLCO reduced at 14.5/60%. She is isolated reduction in diffusion capacity    HRCT chest 02/16/16 IMPRESSION: 1. Mild patchy subpleural reticulation in both lungs with a basilar predominance. No significant traction bronchiectasis. No frank honeycombing. Findings could represent an interstitial lung disease such as nonspecific interstitial pneumonia (NSIP), with early usual interstitial pneumonia (UIP) not excluded. A follow-up high-resolution chest CT study in 12 months is recommended to assess temporal pattern stability. 2. Bilateral lower lobe  solid pulmonary nodules, largest 6 mm. Non-contrast chest CT at 3-6 months is recommended. If the nodules are stable at time of repeat CT, then future CT at 18-24 months (from today's scan) is considered optional for low-risk patients, but is recommended for high-risk patients. This recommendation follows the consensus statement:  Guidelines for Management of Incidental Pulmonary Nodules Detected on CT Images:From the Fleischner Society 2017; published online before print (10.1148/radiol.[id number]). 3. Additional findings include mild-to-moderate centrilobular emphysema, aortic atherosclerosis and 2 vessel coronary atherosclerosis.     Electronically Signed   By: Selinda DELENA Blue M.D.   On: 02/16/2016 14:14    OV 08/31/2016  Chief Complaint  Patient presents with   Follow-up    HRCT was never scheduled. Pt states that the cough is still present >> slightly improved since last OV. Pt states that she feels her allergies has gotten it ramped up again.    Follow-up multifactorial cough associated with autoimmune antibody positive, interstitial lung disease and emphysema previous history of smoking  After last visit in August 2017 she had high resolution CT chest in December 2017 that showed persistence of ILD. I personally visualized the CT chest. He comes for follow-up. She tells me that since August 2017 she has insidious onset of shortness of breath and is progressively worse. It is mild and present only on inclines is not therefore tenderness. In terms of her cough is persistent. It is associated with significant sinus drainage that she thinks is allergy related. She constantly clears her throat. Lab review shows autoimmune antibody positive in August 2017 with scleroderma antibody slightly elevated at 6.4. I referred her to rheumatology but she does not remember this and she did not make this follow-up. She also tells me that she definitely has a long history of [name] phenomena in her fingers but no one has ever formally diagnosed with connective tissu  e disease.  OV 11/28/2016  Chief Complaint  Patient presents with   Follow-up    Pt here after PFT. Pt states her breathing is uchanged since last OV. Pt c/o dry cough.- pt states this has slightly improved since last OV. Pt denies CP/tightness and f/c/s.      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 Since then she has seen rheumatologis Dr. JONETTA. I reviewd the notes and also discussed with the patient wa a good understading of what s going on. She tells me that some of the lupus antibodies have been positive correlating with Raynaud. Patient is on beta blocker for atrial fbrillation and it is the preference by the rheumatologist Dr. D that she switc to calcium  channel blocker. She has a follow-up appointment pending with her electrophysiologist Dr. Fernande.  She is scleroderma antibody positive but according to her rheumtolgist she does not have dermatologic  Manifestation of the disese. 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 She pulm  function test today and this is stable. In terms of arrest or symptoms she stable. The cough is actually improved. But she does get dyspneic walking up stairs especially a few flights. She does not wnt her to pulmonary habilitation because se exrcises on the treadmill although she does not monitor her saturations.   reports that she quit smoking about 19 years ago. She has a 42.00 pack-year smoking history. she has never used smokeless tobacco.   OV 06/12/2017  Chief Complaint  Patient presents with   Follow-up    Pt states that she has been doing good since last visit. States that she has a tickle cough that is sporadic and has SOB that she states is when she climbs 2 flights of stairs. Denies any CP.    Follow-up combined emphysema was interstitial lung disease [autoimmune undifferentiated connective tissue disease interstitial lung disease]. Autoimmune features from May 2018 rheumatology noted with Dr. JONETTA: Raynaud phenomenon positive without gangrene, positive anti-cardiolipin and positive IgM and SCL-70 positive without clinical features of  scleroderma    Last seen June 2018. Since then she's stable. Overall she tells me that only problem is a tickle in her throat and slight cough. This is up and down depending on the pollen exposure she gets. She gets dyspneic for climbing few to several flights of stairs. This is unchanged. She did have full function test today and that shows mild significant worsening though overall gradient is only mild.. However she's not feeling this. She has appointment with Dr. JONETTA  pending    OV 07/18/2017  Chief Complaint  Patient presents with   Follow-up    HRCT done 07/03/17.  Pt states she is the same as she was at last visit. SOB with exertion.    Follow-up combined emphysema was interstitial lung disease [autoimmune undifferentiated connective tissue disease interstitial lung disease]. Autoimmune features from May 2018 rheumatology noted with Dr. JONETTA: Raynaud phenomenon positive without gangrene, positive anti-cardiolipin and positive IgM and SCL-70 positive without clinical features of scleroderma    SAYDIE GERDTS presents for follow-up.  She is here to discuss the results of a high-resolution CT scan of the chest.  The scan was reviewed by Dr. Jeremy thoracic radiology who feels that patient has probable UIP pattern that is definitely progressive compared to August 2017 although there is no comment about progression since June 2018 CT scan.  The pulmonary nodules itself are stable since August 2017 and are likely benign.  The change in the CT scan which is mild progression pulmonary fibrosis corresponds with the pulmonary function test that shows mild progression.  She is now here with her daughter Rocky and her husband.  She also states that 1 of her other daughters has rheumatoid arthritis and is on TNF alpha blockade.  All her 3 daughters have hemo-chromatosis    OV 08/29/2017  Chief Complaint  Patient presents with   Follow-up    Pt states she has been doing good since last visit. Pt  recently went to [place]  and had some mild problems with SOB due to heat.  Pt was prescribed cellcept  and bactrim  but has not yet started meds due to questions with other meds she is taking.    83 year old female with ILD secondary to autoimmune disease clinically suspicious of scleroderma but also previous history of hemochromatosis with family history of hemochromatosis.  She is here with her husband.  This visit is only a discussion visit.  Because she has many questions about starting CellCept  before she actually does.  We did hepatitis virus panel, QuantiFERON gold, G6PD and all this is normal.  Lab work is normal.  I checked with [facility] pharmacist about interactions and was cleared for her to start CellCept .  The only recommendation was for patient to come off Prilosec because of reduced effect of CellCept .  Patient questions revolved around CellCept : She does not want to come off PPI because of hiatal hernia and has had bad acid  reflux.  So I would not Isaiah Medley asking for any alternative PPI.  In addition she states she takes multiple multivitamins including Biotene, vitamin D3, vitamin D , vitamin B12 and folic acid .  She also takes Allegra occasionally.  She wants to make sure it is all okay with CellCept .  She has not had a flu shot today and she is asking if she should have it.  She has never had flu shot before.  She is also wondering about anticoagulation in the setting of CellCept  and thyroid  issues in the setting of Bactrim .  In terms of her genetics: Initially I corresponded with our local geneticist who thought she might be better served at [facility] but the [facility] genetics person wrote to me saying that patient should first be seen by [facility] rheumatology and hematology.  Patient's not so sure she wants to see the subspecialist.  She will speak to Dr. Freddie her local hematologist to see if it is worthwhile seeing the hematology department at [facility].   Lungs/Pleura: Moderate centrilobular emphysema. Mild basilar predominant subpleural reticulation and ground-glass, increased from 02/16/2016. No traction bronchiectasis/ bronchiolectasis, architectural distortion or honeycombing. Side-by-side nodules in the medial left lower lobe measure up to 6 mm (series 3, image 95), unchanged from 02/16/2016 and considered benign. No air trapping. No pleural fluid. Airway is unremarkable.  IMPRESSION: 1. Mild progression in basilar predominant subpleural fibrosis from 02/16/2016, raising suspicion for usual interstitial pneumonitis. 2. Aortic atherosclerosis (ICD10-170.0). Moderate coronary artery calcification. 3.  Emphysema (ICD10-J43.9).     Electronically Signed   By: Newell Eke M.D.   On: 07/03/2017 09:54    OV 09/19/2017  Chief Complaint  Patient presents with   Follow-up    Pt states she has been doing good. Currently on cellcept  and bactrim  and has been doing well on it once put back on omeprazole. Pt still has the cough and sometimes when trying to take a deep breath can't fully get all O2 out.   Nely Dedmon returns for follow-up.  This is for autoimmune interstitial lung disease.  She was started on CellCept  recently.  She is here to follow-up for therapeutic drug monitoring.  Because she was on omeprazole and we were initiating CellCept  with Bactrim  we asked her to hold off on omeprazole because the omeprazole impairs drug levels of CellCept .  However she tried it for a week and her acid reflux was significant despite ranitidine so she went back on omeprazole.  Pharmacist Isaiah Medley is advising changing the CellCept  to equivaekbtdose of Myfortic . So far tolerating cellcept /pred/bactrimm well. Has seen dR G in heme and on observation. Had PFT today on cellcept /pred/bactrim  and is better! Continue siwht spriva  OV 10/31/2017  . Chief Complaint  Patient presents with   Follow-up    Pt currently taking  myfortic  and states she has been well on that. Pt states she has some mild nausea and has some problems with sleeping at night. SOB is stable,mild coughing. Denies any CP.    Follow-up progressive interstitial lung disease with SCL-70 antibody positive and Raynard Follow-up associated emphysema.  Last visit September 19, 2017.  At that time she had intolerance to the CellCept  therefore we switched her to Myfortic .  She is here to report for follow-up about this.  She is tolerating Myfortic  just well.  She had some transient insomnia for a few days last week but this resolved.  She only has mild intermittent occasional nausea with the Myfortic  but otherwise is  tolerating it really well at the low-dose of 360 mg twice daily.  She continues on prednisone  10 mg daily and Bactrim  3 times a week.  She is due for blood work today.  There are no other new issues.  She continues on Spiriva  for her emphysema   OV 02/01/2018  Chief Complaint  Patient presents with   Follow-up    PFT performed today.  Pt stated other than having shingles earlier, things have been doing good for her. Pt states SOB is stable and states she still has an occ cough.    Follow-up progressive interstitial lung disease with SCL-70 antibody positive and Raynud - IPAF Follow-up associated emphysema   JOSEPHINA MELCHER presents for routine follow-up with interstitial lung disease clinic. She has the above problems. In the interim she tells me she had developed shingles on the right neck but this is resolved. This is despite having zoster vaccine in 2012. She was yet to have the bnnew  shingles vaccine.in any event currently she is feeling well. She only has occasional post residual neuropathic pain. In terms of her shortness of breath it is stable and minimal. Walking desaturation test shows stability. Pulmicort function test below show stability. She is tolerating the mycophenolate  myfortic  well at 360 mg twice daily and prednisone  10 mg  daily and Bactrim  for PCP prophylaxis. Overall she feels stable and feels that it is best medicines on touch in terms of dosing change  OV 08/14/2018  Subjective:  Patient ID: [name], female , DOB: Aug 25, 1940 , age 78 y.o. , MRN: [id number] , ADDRESS: [address] [place] [address]   08/14/2018 -   Chief Complaint  Patient presents with   Follow-up    PFT performed today.  Pt states she is slowly getting better after her recent pna. States she still has some issues with SOB and an occ cough. Pt also states that she feels like she has a weight on her chest at times.    Follow-up progressive interstitial lung disease with SCL-70 antibody positive and Raynud - IPAF - on myfortic  since march 2019 with pred 10 and bactrim   Follow-up associated emphysema - on spiriva     HPIfortic DEANZA UPPERMAN 83 y.o. -returns for 36-month follow-up of her interstitial lung disease associated with emphysema.  She presents with her middle daughter Rocky who is met me before.  She continues on Myfortic  and prednisone  10 mg and Bactrim  prophylaxis.  She continues on Spiriva .  She had an episode of atrial fibrillation according to review of the chart and my on history with in the middle of January 2020.  At this time she had a chest x-ray that showed asymptomatic upper lobe pneumonia.  I personally visualized the chest x-ray and confirmed the findings.  Antibiotic was not prescribed.  Then I follow this up with a CT scan of the chest [we canceled her elective colonoscopy].  CT chest showed improved pneumonia findings.  I prescribed even though she was feeling well cephalexin  for a week.  She says she took it.  She continues to feel well except she has baseline amount of symptoms that is documented below.  Of note the high-resolution CT chest showed an improvement in ILD compared to a year ago.  In fact her pulmonary function tests are also showing stability/improvement as documented below.  She is very  happy about this outcome with immunosuppression .  However, she is a little bit unsure why she is symptomatic in  terms of shortness of breath and cough.  She is very concerned that her fibrosis is getting worse.  She wants to have closer follow-up than every 6 months.   IMPRESSION: HRCT Jan 2020 1. Vaguely bandlike patchy consolidation and ground-glass opacity at the periphery of the right upper and superior segment right lower lobes, new since 07/03/2017 chest CT, improving compared to 07/10/2018 chest radiograph, favoring resolving pneumonia with evolving postinfectious scarring. 2. Previously described basilar predominant subpleural reticulation and ground-glass attenuation is minimal and appears decreased in the interval. No bronchiectasis or honeycombing. Findings may represent an interstitial lung disease such as nonspecific interstitial pneumonia (NSIP) or less likely early usual interstitial pneumonia (UIP). Findings are indeterminate for UIP per consensus guidelines: Diagnosis of Idiopathic Pulmonary Fibrosis: An Official ATS/ERS/JRS/ALAT Clinical Practice Guideline. Am JINNY [employer] Crit Care Med Vol 198, Iss 5, [phone], Feb 25 2017. 3. Moderate centrilobular emphysema with mild diffuse bronchial wall thickening, suggesting COPD. 4. Two-vessel coronary atherosclerosis. 5. Small hiatal hernia.   Aortic Atherosclerosis (ICD10-I70.0) and Emphysema (ICD10-J43.9). ROS - per HPI    OV 04/22/2019  Subjective:  Patient ID: [name], female , DOB: January 24, 1941 , age 11 y.o. , MRN: [id number] , ADDRESS: [address] [place] [address]   Follow-up slowly progressive interstitial lung disease with SCL-70 antibody positive and Raynud - IPAF - on myfortic  since march 2019 with pred 5 and bactrim  3x/week  Follow-up associated emphysema - on spiriva    04/22/2019 -   Chief Complaint  Patient presents with   Follow-up    Patient reports that her breathing is doing well  at this time.      HPI RUTHE ROEMER 83 y.o. -presents for follow-up.  Last seen by myself in early part of 2020 before the pandemic.  At that time CT chest suggested possible progression.  She saw a nurse practitioner in the interim after the onset of the pandemic.  She was anxious about the progression of her ILD so she asked for a CT scan in less than 1 year.  A CT scan of the chest was done in September 2020.  This is deemed stable since early part of the year but with possible progression versus stability since January 2019.  In terms of her symptoms she continues to be stable with very minimal shortness of breath and cough.  Her pulmonary function test below also shows stability.  Her walking desaturation test also shows stability.  She is tolerating her Myfortic , prednisone  and 3 times a week of Bactrim  just fine.  There are no side effects.  Last blood work was in August 2020 and this was reviewed.  We discussed the possibility of switching to antifibrotic nintedanib instead of taking immunosuppressants.  The rational for this would be it is a safer drug with less long-term side effect such as lack of immunosuppression.  However she feels her current regimen is working well for her.  She also feels that she would benefit more from a immunosuppressive drug because of her autoimmune antibodies and therefore she is more inclined to continue with her current immunosuppressive regimen of prednisone  5 mg/day and Myfortic  and Bactrim  3 times a week.        OV 07/04/2019  Subjective:  Patient ID: [name], female , DOB: 1941-02-15 , age 43 y.o. , MRN: [id number] , ADDRESS: [address] [place] [address]   Follow-up mildy progressive - stable interstitial lung disease with SCL-70 antibody positive and Raynud -  IPAF - on myfortic  since march 2019 with pred 5 and bactrim  3x/week  Follow-up associated emphysema  Follow-up Raynard  Has associated hemochromatosis  Has  associated atrial fibrillation on Xarelto    07/04/2019 -   Chief Complaint  Patient presents with   Follow-up    Pt states she has been doing well since last visit and denies any complaints.     HPI CONTINA STRAIN 83 y.o. -last visit was in October 2020.  At that time she was doing well.  Given her continued immunosuppression with prednisone  and Myfortic  we discussed the possibility of switching the Myfortic  to the nintedanib but understanding that there uncertainties whether this is superior approach and if she could end up with temporary side effects of the nintedanib.  Based on that she decided to continue with prednisone , Myfortic  and Bactrim  as she was doing because things are working well.  However she later called in and wanted this change to nintedanib.  We did this change to nintedanib but then her co-pay ended up being over 500+ dollars and then she started also getting worried about the side effects with nintedanib particularly GI.  She is also on Xarelto  for atrial fibrillation.  Therefore she made this visit ahead of schedule to discuss the possibility of making the switch.  I understand from her that it will be 500+ dollars every month with nintedanib as a co-pay.  In addition she is worried about the superiority of taking nintedanib.  She fully understands that Myfortic  over length of time can carry cancer risk and immunosuppression risk but at this point in time she feels well and she is tolerating it well.  She also understands that nintedanib can carry temporary GI side effects.  She also understands that it may not be a superior approach.  She understands it could be an equivalent approach and definitely lower immunosuppression.  Her main systemic immunosuppressive features are lung disease and Raynaud.  She is worried this might flareup.  In terms of her symptoms there is stable as documented below.  Her walking desaturation test is also stable.   Last pulmonary function test  was in August r 2020 Last CT scan of the chest was in September 2020 Last blood work October 2020 Last echocardiogram 2012.  ROS - per HPI  OV 10/22/2019  Subjective:  Patient ID: [name], female , DOB: 12/30/1940 , age 52 y.o. , MRN: [id number] , ADDRESS: [address] [place] [address]   Follow-up mildy progressive - stable interstitial lung disease with SCL-70 antibody positive and Raynud - IPAF - on myfortic  since march 2019 with pred 5 and bactrim  3x/week  Follow-up associated emphysema  Follow-up Raynaud  Has associated hemochromatosis  Has associated atrial fibrillation on Xarelto     10/22/2019 -   Chief Complaint  Patient presents with   Follow-up    PFT performed today. Pt states she has been doing good since last visit. Denies any complaints with breathing.     HPI BRITANIA SHREEVE 83 y.o. -returns for routine follow-up.  Overall doing well.  She feels shortness of breath is stable.  She feels she is tolerating Myfortic  and prednisone  fine no new interim issues she has had a Covid vaccine.  She continues on high risk immunosuppressive therapy.  There is some mild diarrhea with the Myfortic  but it is tolerable.  The shortness of breath scale is stable walking desaturation test is stable.  Her pulmonary function test shows stable FVC with  a possible reduction in diffusion capacity.  I do not know what to make of this at this point in time.  It could be a technical issue because she is feeling stable.  She did have echocardiogram that showed grade 2 diastolic dysfunction.   She continues on her Spiriva  for associated emphysema.  ROS - per HPI     OV 03/03/2020   Subjective:  Patient ID: [name], female , DOB: Nov 23, 1940, age 89 y.o. years. , MRN: [id number],  ADDRESS: [address] 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 -returns for follow-up.  Overall she is doing well.  However in the last 1-2 months she says she is slightly more short of breath than usual.  She is also having slight increase in dry cough.  Based on her subjective symptom questionnaires it seems the cough itself is stable but she is definitely saying it is worse.  She is seeing the dyspnea is overall stable compared to the cough but looking at the symptom questionnaire is maybe it is a little worse.  Is hard to discern.  It has been approximately 1 year since she had a high-resolution CT chest.  She is wondering if she can have this repeated.  She is on immunosuppression.  She is also noticing -that at home her pulse oximetry on her finger varies a lot [she does have [name]]) and sometimes can be as low as 88% - 93%.  She does acknowledge that the circulation in her hands is not as good.  Pulse ox here at rest was normal.  She had pulmonary function test that shows stability.-With the FVC although I am not sure what to make of the DLCO.  She has had a Covid booster vaccine  Last echocardiogram January 2021 with mild diastolic dysfunction    IMPRESSION: 1. Very mild basilar predominant subpleural reticulation and ground-glass, similar to minimally progressive from 07/03/2017. Nonspecific interstitial pneumonitis is favored. Findings are indeterminate for UIP per consensus guidelines: Diagnosis of Idiopathic Pulmonary Fibrosis: An Official ATS/ERS/JRS/ALAT Clinical Practice Guideline. Am JINNY Honey Crit Care Med Vol 198, Iss 5, [phone], Feb 25 2017. 2. Aortic atherosclerosis (ICD10-170.0). Coronary artery calcification. 3. Enlarged pulmonic trunk, indicative of pulmonary arterial hypertension. 4.  Emphysema (ICD10-J43.9).     Electronically Signed   By: Newell Eke M.D.   On: 03/20/2019 14:47  Echocardiogram -January 2021: Shows grade 2 diastolic dysfunction but otherwise unremarkable.   ROS - per HPI  HPI LERLENE TREADWELL 83 y.o. -last visit was in September 2021.  She continues to do well overall.  Shortness of breath symptom question is stable.  Walking desaturation test is stable.  She is compliant with her medications.  In September 2021 there is concern that her ILD was getting worse because of change in PFT.  So we did a CT scan of the chest radiologist reported this to be stable since 2017.  Most recently she  had blood work in December 2021 chemistry panel is normal and I reviewed it.  CBC panel is also normal with stable mild anemia hemoglobin 12.4 g%.  She is up-to-date with her COVID vaccine including booster.  She had pulmonary function test today that showed some fluctuation.  Her main concern is that she is coughing more  In terms of her cough it is dry and has a barking quality to it.  Or laryngeal quality to it.  It is mostly at day and night but never wakes her up at night.  When she has it at night it is only when she wakes up.  Talking makes it worse.  Laughing makes it worse.  Drinking water makes it better.  She thinks it is progressive.  Symptom score showed the cough is getting worse and deep.  She has no fever or chills.  No desaturations.  We discussed the possibility of opportunistic infection but she rightfully questant if it could be opportunistic infection if everything else is stable.  There is no wheezing.  She is wondering if it could be because of allergies.  She has seasonal allergies.  Review of the records indicate this has not been  investigated.  She is willing to have an investigation for this.  We discussed the possibility of cough neuropathy.  She recollects has had gabapentin  before when she had shingles.  She did not have any side effects from it.     CT Chest data sept 2021  TECHNIQUE: Multidetector CT imaging of the chest was performed following the standard protocol without intravenous contrast. High resolution imaging of the lungs, as well as inspiratory and expiratory imaging, was performed.   COMPARISON:  09/09/2019, 03/20/2019, 07/03/2017, 12/06/2016, 06/15/2016   FINDINGS: Cardiovascular: Aortic atherosclerosis. Normal heart size. Three-vessel coronary artery calcifications. No pericardial effusion.   Mediastinum/Nodes: No enlarged mediastinal, hilar, or axillary lymph nodes. Thyroid  gland, trachea, and esophagus demonstrate no significant findings.   Lungs/Pleura: Moderate centrilobular emphysema. No significant interval change in subtle irregular peripheral interstitial opacity predominantly seen in the bilateral lung bases, although also noted in nondependent portions of the right middle lobe and lingula. Mild lobular air trapping on expiratory phase imaging. Stable, definitively benign and faintly rim calcified 6 mm nodule of the dependent left lower lobe (series 5, image 86). No pleural effusion or pneumothorax.   Upper Abdomen: No acute abnormality.   Musculoskeletal: No chest wall mass or suspicious bone lesions identified.   IMPRESSION: 1. No significant interval change in subtle irregular peripheral interstitial opacity predominantly seen in the bilateral lung bases, although also noted in nondependent portions of the right middle lobe and lingula. There is no appreciable change in these findings on multiple prior examinations dating back to 2017. Bland post infectious or inflammatory scarring is favored, although minimal fibrotic interstitial lung disease is not excluded and  if characterized by ATS pulmonary fibrosis criteria, findings are consistent with an early indeterminate for UIP pattern. Consider ongoing follow-up CT to assess for stability of fibrotic findings. Findings are indeterminate for UIP per consensus guidelines: Diagnosis of Idiopathic Pulmonary Fibrosis: An Official ATS/ERS/JRS/ALAT Clinical Practice Guideline. Am JINNY Honey Crit Care Med Vol 198, Iss 5, [id number], Feb 25 2017. 2. Emphysema (ICD10-J43.9). 3. Coronary artery disease.  Aortic Atherosclerosis (ICD10-I70.0).     Electronically Signed   By: Marolyn Jaksch M.D.   On: 03/16/2020 14:12  OV 02/23/2021  Subjective:  Patient ID: [name], female , DOB: 02/04/1941 , age 72 y.o. ,  MRN: [id number] , ADDRESS: [address] [place] [address] PCP Cleotilde Planas, MD Patient Care Team: Cleotilde Planas, MD as PCP - General (Family Medicine) Fernande Elspeth BROCKS, MD as PCP - Cardiology (Cardiology) Freddie Lynwood HERO, MD as Consulting Physician (Hematology) Fernande Elspeth BROCKS, MD as Consulting Physician (Cardiology) Luis Purchase, MD as Consulting Physician (Gastroenterology)  This Provider for this visit: Treatment Team:  Attending Provider: Geronimo Amel, MD    02/23/2021 -   Chief Complaint  Patient presents with   Follow-up    Pt states she has been doing okay since last visit and denies any complaints.    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  Follow-up associated emphysema  Follow-up Raynaud  Has associated hemochromatosis  Has associated atrial fibrillation on Xarelto   Mild associated diastolic dysfunction and echo January 2021  HPI SENIA EVEN 83 y.o. -returns for her 49-month follow-up.  Last visit with me was in February 2022.  Is   She continues on Myfortic  prednisone  and Bactrim .  We  held off on nintedanib given stability.  She did have spirometry in February 2022.?  There is a slight decline but she feels stable.  Symptom scores are stable.  Walking desaturation test is below and is stable versus a three-point drop that is?  Significant..  Last CT scan September 2021.  But clinically she feels stable.  We held off on nintedanib last visit because of stability.       OV 07/23/2021  Subjective:  Patient ID: [name], female , DOB: 17-Aug-1940 , age 89 y.o. , MRN: [id number] , ADDRESS: [address] [place] [address] PCP Cleotilde Planas, MD Patient Care Team: Cleotilde Planas, MD as PCP - General (Family Medicine) Fernande Elspeth BROCKS, MD as PCP - Cardiology (Cardiology) Freddie Lynwood HERO, MD as Consulting Physician (Hematology) Fernande Elspeth BROCKS, MD as Consulting Physician (Cardiology) Luis Purchase, MD as Consulting Physician (Gastroenterology)  This Provider for this visit: Treatment Team:  Attending Provider: Geronimo Amel, MD    07/23/2021 -   Chief Complaint  Patient presents with   Follow-up    Pt states she has been doing okay and denies any complaints.    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  Follow-up associated emphysema  Follow-up Raynaud  Has associated hemochromatosis  Has associated atrial fibrillation on Xarelto   Mild associated diastolic dysfunction and echo January 2021  HPI AIREL MAGADAN 83 y.o. -returns for 72-month follow-up.  Last seen in August 2022.  She feels since then dyspnea slightly worse.  This is reflected in the symptom score below.  In December 2022 he did have COVID and she recovered from it.  She does not think the decline is because of the COVID.  She continues on her prednisone  Bactrim  and Myfortic .  Her last CT scan of the chest was in  September 2021.  Last echocardiogram was in January 2021.  Last pulmonary function test was almost 1 year ago in February 2022.  She is wondering about repeat investigations.  She is concerned about worsening ILD.    Of note she just turned 83 years old.  OV 09/09/2021  Subjective:  Patient ID: Tara JINNY  Welford, female , DOB: [birth year] , age 12 y.o. , MRN: [id number] , ADDRESS: [address] [place] [address] PCP Cleotilde Planas, MD Patient Care Team: Cleotilde Planas, MD as PCP - General (Family Medicine) Fernande Elspeth BROCKS, MD as PCP - Cardiology (Cardiology) Freddie Lynwood HERO, MD as Consulting Physician (Hematology) Fernande Elspeth BROCKS, MD as Consulting Physician (Cardiology) Luis Purchase, MD as Consulting Physician (Gastroenterology)  This Provider for this visit: Treatment Team:  Attending Provider: Geronimo Amel, MD    09/09/2021 -   Chief Complaint  Patient presents with   Follow-up    PFT performed today. Pt states she has been doing okay and denies any complaints.    HPI NOELLE SEASE 83 y.o. -returns for follow-up.  At this point in time she is feeling stable.  She had work-up to see if her ILD is worse or she has developed pulmonary hypertension.  CT scan shows stability since 2021.  Her pulmonary function test shows a 3% decline in DLCO.  On an average she has 3% decline per year over the last 4 years.  At this point in time she is feeling well.  No side effects from Myfortic .  She asked about options if she were to get worse.  We discussed about nintedanib.  Also indicated there is an injection [tocilizumab] but at this point in time she is fine with the choices.  She does understand Myfortic  is immunosuppressant and there is also cancer risk.  She is decided to continue.  She also has mild emphysema for which she is on Spiriva  and it is stable.  I gave her a book called Thursdays with Sherwood written by the patient who has pulmonary fibrosis and scleroderma.  She  is going to read it.  We did discuss briefly clinical trials as a care option and she is interested  -No clearing of the throat.  -She has seen rheumatology recently.  She continues to have [name].  They have a nitroglycerin .  She does not want to try it.  We discussed about using heart warmers.  She is going to buy this   CT Chest data  XR C-ARM NO REPORT  Result Date: 09/07/2021 Please see Notes tab for imaging impression.      OV 04/21/2022  Subjective:  Patient ID: Tara JINNY Welford, female , DOB: [birth year] , age 32 y.o. , MRN: [id number] , ADDRESS: [address] [place] [address] PCP Cleotilde Planas, MD Patient Care Team: Cleotilde Planas, MD as PCP - General (Family Medicine) Fernande Elspeth BROCKS, MD as PCP - Cardiology (Cardiology) Freddie Lynwood HERO, MD as Consulting Physician (Hematology) Fernande Elspeth BROCKS, MD as Consulting Physician (Cardiology) Luis Purchase, MD as Consulting Physician (Gastroenterology)  This Provider for this visit: Treatment Team:  Attending Provider: Geronimo Amel, MD  04/21/2022 -   Chief Complaint  Patient presents with   Follow-up    6 Follow-up PT states no changes, DOE (upstairs, fast paced walking)     HPI KATRINKA HERBISON 83 y.o. -returns for follow-up.  She tells me that since last seeing me since the summer or so she is slightly more short of breath especially when she climbs stairs but overall symptom score is stable.  In September 2023 she went on vacation to [facility] [place]  and came back with her girlfriends and she had a great time but she did notice more shortness of breath than usual.  She attributed this to heavier exertion but nevertheless with stairs she is  finding herself more short of breath.  She is compliant with Spiriva  for associated emphysema Myfortic  Bactrim  and daily prednisone .  2 days ago she saw Cheryl Birmingham rheumatology PA for her scleroderma and has been deemed stable  Pulmonary function test shows  slight reduction in FVC over time but still within normal limits but the DLCO is dramatically low.  There is no anemia correction on the DLCO.  When I did a rough anemia correction it appears that DLCO might be okay.  Of asked for a formal correction on the DLCO for anemia.  Given her anemia which appears to be relatively more pronounced compared to June 21, 2023greater than 1.5 g% drop she has been referred by rheumatology to hematology because of history of hemochromatosis.  Her last echo and CT scan of the chest were earlier this year  We discussed about the 3 different respiratory vaccines.  Strongly recommended COVID mRNA because she is immunocompromised.  She did have outpatient COVID December 2022 but it was a different strain.  Also recommend high-dose flu shot which she will get today and we discussed RSV vaccine.     PFT  OV 05/26/2022  Subjective:  Patient ID: [name], female , DOB: [birth year] , age 23 y.o. , MRN: [id number] , ADDRESS: [address] [place] [address] PCP Cleotilde Planas, MD Patient Care Team: Cleotilde Planas, MD as PCP - General (Family Medicine) Fernande Elspeth BROCKS, MD as PCP - Cardiology (Cardiology) Fernande Elspeth BROCKS, MD as Consulting Physician (Cardiology) Luis Purchase, MD as Consulting Physician (Gastroenterology) Federico Norleen ONEIDA MADISON, MD as Consulting Physician (Hematology and Oncology)  This Provider for this visit: Treatment Team:  Attending Provider: Geronimo Amel, MD    05/26/2022 -   Chief Complaint  Patient presents with   Acute Visit    Pt started having complaints of dry cough, wheezing, and chest discomfort which began 1 week ago. Has done multiple covid tests which all have come back negative. States she is now coughing up phlegm and also has complaints of a headache and has no energy.   HPI YACINE DROZ 83 y.o. -she is making an acute visit.  She presents with her daughter Rocky who I have met before.  She was on a  [place] cruise out of [place] between May 14, 2022 and Saturday after Thanksgiving May 20, 2022.  On Friday after Thanksgiving she suddenly took ill.  Symptoms got worse on Saturday when she returned.  She describes laryngitis ear pain cough headaches sore throat runny nose brain fog increased fatigue and sleeping.  COVID test x 2 was negative.  She called our office and we gave her antibiotic and prednisone  because the sputum is now yellow.  She is taking this but she wanted an acute visit today.  Some symptoms are better.  She is saying that her headache is better sore throat is better congestion is better and the tiredness is better but according to the daughter she still sleeping a lot.  Her cough nature has changed and she is got some yellow sputum now.  So some symptoms are not better.  Walking desaturation test is stable but she only walked 2 laps and she stopped because of fatigue.  The cough is particularly worse at night.  She does not want any cough medication  OV 07/08/2022  Subjective:  Patient ID: [name], female , DOB: 01-19-1941 , age 87 y.o. , MRN: [id number] , ADDRESS: [address] [place]  [place] [address] PCP Cleotilde Planas, MD Patient Care Team: Cleotilde Planas, MD as PCP - General (Family Medicine) Fernande Elspeth BROCKS, MD as PCP - Cardiology (Cardiology) Fernande Elspeth BROCKS, MD as Consulting Physician (Cardiology) Luis Purchase, MD as Consulting Physician (Gastroenterology) Federico Norleen ONEIDA MADISON, MD as Consulting Physician (Hematology and Oncology)  This Provider for this visit: Treatment Team:  Attending Provider: Geronimo Amel, MD     07/08/2022 -   Chief Complaint  Patient presents with   Follow-up    Spiro/dlco done today. She states had a hospitalization 06/20/22 with compression fx. Her cough is about the same- occ prod and has to clear throat but does not cough out the sputum. Breathing is at baseline.      HPI DOLLIE BRESSI 83 y.o.  -returns for follow-up.  I saw her just after Thanksgiving 2023 when she had come back from a cruise and was having respiratory symptoms of acute bronchitis.  She at that time was through her doxycycline  and prednisone  taper she had significant cough.  It appears that after that visit her cough continued unabated.  She was coughing a lot at night.  Then she ended up in December 2023 according to history and review of the external records in the hospital with L2 spinal compression fracture.  She believes the cough caused it.  I did explain to her that I have clinically not seen lumbar spinal compression fracture from cough [I subsequently discussed with Dr. Ozell America about cough expert and he has seen a few patients with lumbar spine compression fracture with severe cough].  In the hospital hemoglobin was 12.3 g%.  And a creatinine was 0.8 mg percent.  She is now back home she is improving.  She is status post kyphoplasty 06/30/2022.  She still has a cough she says she is 80% better compared to when she saw me in November 2023.  She still has some nighttime cough.  She is willing to try some Tessalon  Perles all she is tried so far is Delsym cough syrup.  She continues on her chronic immunosuppressive medications.  At the last visit I ordered an echocardiogram: This is currently pending and is going to be done soon  At the last visit I ordered high-resolution CT chest but in the hospital she did have a CT angiogram: Mild ILD is reported although this is a contrasted CT.  This was done on 06/20/2022.  I personally visualized and independently agree with the findings  She did a pulmonary function test today: This is stable.     OV 11/24/2022  Subjective:  Patient ID: [name], female , DOB: April 03, 1941 , age 24 y.o. , MRN: [id number] , ADDRESS: [address] [place] [address] PCP Cleotilde Planas, MD Patient Care Team: Cleotilde Planas, MD as PCP - General (Family Medicine) Fernande Elspeth BROCKS, MD as PCP - Cardiology (Cardiology) Fernande Elspeth BROCKS, MD as Consulting Physician (Cardiology) Luis Purchase, MD as Consulting Physician (Gastroenterology) Federico Norleen ONEIDA MADISON, MD as Consulting Physician (Hematology and Oncology)  This Provider for this visit: Treatment Team:  Attending Provider: Geronimo Amel, MD  11/24/2022 -   Chief Complaint  Patient presents with   Follow-up    Annual f/up     HPI [name] 83 y.o. -returns for follow-up.  She says her shortness of breath itself is stable.  Exercise size sit/stand desaturation test today stable.  No new medical problems.  No emergency room visits.  However on general dynamics  day weekend she did go to the emergency room and had an infection of the right small toe.  She is on doxycycline .  Other than that she is well.  But the main issue is that her chronic severe cough persist.  This cough got worse in November 2023/January 2024 after a cruise and viral infection and spinal fracture.  This continues unabated.  Wakes her up at night.  As a tickling of the chest there is no wheezing there is no fever there is a dry cough.  Review of the labs indicate she has never had allergy panel test.  Her eosinophils are slightly elevated.  Her last CT scan of the chest was in December 2023 but was an angiogram.  Showed mild ILD.   feNO could NOT do  OV 12/15/2022  Subjective:  Patient ID: [name], female , DOB: 1940/08/05 , age 82 y.o. , MRN: [id number] , ADDRESS: [address] [place] [address] PCP Cleotilde Planas, MD Patient Care Team: Cleotilde Planas, MD as PCP - General (Family Medicine) Fernande Elspeth BROCKS, MD as PCP - Cardiology (Cardiology) Fernande Elspeth BROCKS, MD as Consulting Physician (Cardiology) Luis Purchase, MD as Consulting Physician (Gastroenterology) Federico Norleen ONEIDA MADISON, MD as Consulting Physician (Hematology and Oncology)  This Provider for this visit: Treatment Team:  Attending Provider: Geronimo Amel,  MD   12/15/2022 -   Chief Complaint  Patient presents with   Follow-up    F/up on PFT and blood work results   Type of visit: Video Virtual Visit Identification of patient AFIYA FERREBEE with [birth year] and MRN [id number] - 2 person identifier Risks: Risks, benefits, limitations of telephone visit explained. Patient understood and verbalized agreement to proceed Anyone else on call: just patien Patient location: her home This provider location: [address]; [place]; [place] [address]. [facility]. [phone]    HPI SAHMYA ARAI 83 y.o. -returns for follow-up on this video visit.  This video visit is to discuss some of the evaluation we did for chronic cough.  I noticed that her cat dander is slightly positive but she tells me that she has no cats at all and no exposure to cats recently.  Otherwise allergy panel is negative.  She did have pulmonary function test yesterday and shows continued stability.  Overall in terms of her symptoms she tells me she stable she is continuing her medications.  She says the cough is slightly better.  The frequency is much less but when it comes to severe it is the same but overall she feels improved and stable.  She feels it was the allergy season that made her cough more.  We discussed the potential role of bronchoscopy with lavage rule out opportunistic infections which I said was low probability of finding alternative etiologies for cough.  We also discussed the option of waiting and watching.  She prefer the latter.  Will see her back in 6 months with a spirometry and DLCO.     OV 07/06/2023  Subjective:  Patient ID: [name], female , DOB: 1940/11/09 , age 20 y.o. , MRN: [id number] , ADDRESS: [address] [place] [address] PCP Cleotilde Planas, MD Patient Care Team: Cleotilde Planas, MD as PCP - General (Family Medicine) Fernande Elspeth BROCKS, MD as PCP - Cardiology (Cardiology) Fernande Elspeth BROCKS, MD as  Consulting Physician (Cardiology) Luis Purchase, MD as Consulting Physician (Gastroenterology) Federico Norleen ONEIDA MADISON, MD as Consulting Physician (Hematology and  Oncology)  This Provider for this visit: Treatment Team:  Attending Provider: Geronimo Amel, MD    Follow-up mildy sllowy progressive - stable interstitial lung disease with 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  Has associated hemochromatosis  Has associated atrial fibrillation on Xarelto   Mild associated diastolic dysfunction in pst but not on echo  Feb 2023  LBP with L2 compression frcture status post kyphoplasty 1//24.- dec 2023 after viral inection during cruise nov 2023  07/06/2023 -   Chief Complaint  Patient presents with   Follow-up    Pft f/u, denies any concerns states she is the same.      HPI ARLISA LECLERE 83 y.o. -returns for a 76-month follow-up.  She says she has been doing well from a respiratory standpoint.  She continues on Myfortic  without any side effects other than very occasional diarrhea.  She continues low-dose prednisone  5 mg and also Bactrim  prophylaxis.  For associated emphysema she continue Spiriva .  She says she safety labs not otherwise specified at primary care office last month.  She is reluctant to get them tested today.  Most recent labs in October 2024 liver function test and chemistries were normal within our system.  Therefore I have accepted that.  She states on June 25, 2023 she fell on a staircase and sustained a 7-10 cm longitudinal laceration on the distal third of the left shin.  It is nonhealing at this point.  There was a lot of oozing of blood.  Since yesterday she is at the wound center.  Did explain  to her along with aging and chronic prednisone  contributing to poor wound healing.  Asked if she is willing to taper the prednisone  slowly.  She is willing to give this a try.  She had pulmonary function test today and her lung function stable for the last 2 years.  Last CT scan was 2 years ago and she is willing to have another 1 at follow-up.     OV 11/16/2023  Subjective:  Patient ID: [name], female , DOB: 30-Nov-1940 , age 17 y.o. , MRN: [id number] , ADDRESS: [address] [place] [address] PCP Cleotilde Planas, MD Patient Care Team: Cleotilde Planas, MD as PCP - General (Family Medicine) Fernande Elspeth BROCKS, MD as PCP - Cardiology (Cardiology) Fernande Elspeth BROCKS, MD as Consulting Physician (Cardiology) Luis Purchase, MD as Consulting Physician (Gastroenterology) Federico Norleen ONEIDA MADISON, MD as Consulting Physician (Hematology and Oncology)  This Provider for this visit: Treatment Team:  Attending Provider: Geronimo Amel, MD    11/16/2023 -   Chief Complaint  Patient presents with   Follow-up    Breathing is doing well and she denies any new co's.      HPI ALEXANDRINA FIORINI 83 y.o. -returns for follow-up.  She reports overall stability in pulmonary health.  Last visit because of prolonged prednisone  intake for many years and ongoing pulmonary stability and because of shin ulcer I recommend coming off prednisone .  By March April 2025 she had significant prednisone  withdrawal and that she started having cough and fatigue and feeling poorly.  She went back on prednisone  and since then she is feeling well.  The cough is just minimal.  Shortness of  breath is stable.  She CT scan of the chest that I personally visualized show due to her ILD stable now for couple of years.  She is pleased by this.  In April 2025 she did have blood labs that can be considered as part of Myfortic  monitoring and these are stable.  Since her last visit she has seen oncology who is monitoring her anemia.   She is also seeing GI because of her diarrhea and they are going to do procedures on her.  She and I discussed the fact diarrhea could be because of Myfortic   She also had a CT head as follow-up with her neurologist.   CT Chest data from date: apiril 2025  - personally visualized and independently interpreted : yes - my findings are: as below Narrative & Impression  CLINICAL DATA:  Diffuse/interstitial lung disease.   EXAM: CT CHEST WITHOUT CONTRAST   TECHNIQUE: Multidetector CT imaging of the chest was performed following the standard protocol without intravenous contrast. High resolution imaging of the lungs, as well as inspiratory and expiratory imaging, was performed.   RADIATION DOSE REDUCTION: This exam was performed according to the departmental dose-optimization program which includes automated exposure control, adjustment of the mA and/or kV according to patient size and/or use of iterative reconstruction technique.   COMPARISON:  06/20/2022 and 08/24/2021.   FINDINGS: Cardiovascular: Atherosclerotic calcification of the aorta, aortic valve and coronary arteries. Enlarged pulmonic trunk and heart. No pericardial effusion.   Mediastinum/Nodes: No pathologically enlarged mediastinal or axillary lymph nodes. Hilar regions are difficult to definitively evaluate without IV contrast. Esophagus is grossly unremarkable.   Lungs/Pleura: Centrilobular and paraseptal emphysema. Mild basilar predominant subpleural reticular densities and ground-glass, similar to 06/20/2022. There may be minimal associated bronchiolectasis. No honeycombing. Posterior left lower lobe nodule measures 5 mm (4 x 6 mm, 7/81), unchanged from 06/20/2022. Per Fleischner Society guidelines, no follow-up is necessary. No new pulmonary nodules. No pleural fluid. Airway is unremarkable. Minimal air trapping.   Upper Abdomen: Small hiatal hernia. Visualized portions of the liver, gallbladder, adrenal  glands, kidneys, spleen, pancreas, stomach and bowel are otherwise grossly unremarkable. No upper abdominal adenopathy.   Musculoskeletal: Upper lumbar vertebral body augmentations. Osteopenia. Degenerative changes in the spine. Age indeterminate mild T10 compression fracture.   IMPRESSION: 1. Mild basilar predominant subpleural reticular densities and ground-glass with questionable bronchiolectasis, similar to 06/20/2022. Findings are categorized as probable UIP per consensus guidelines: Diagnosis of Idiopathic Pulmonary Fibrosis: An Official ATS/ERS/JRS/ALAT Clinical Practice Guideline. Am JINNY Honey Crit Care Med Vol 198, Iss 5, [phone], Feb 25 2017. Findings are indeterminate for UIP per consensus guidelines: Diagnosis of Idiopathic Pulmonary Fibrosis: An Official ATS/ERS/JRS/ALAT Clinical Practice Guideline. Am JINNY Honey Crit Care Med Vol 198, Iss 5, [id number], Feb 25 2017. 2. Minimal air trapping is indicative of small airways disease. 3. Age indeterminate mild T10 compression fracture. 4. Aortic atherosclerosis (ICD10-I70.0). Coronary artery calcification. 5. Enlarged pulmonic trunk, indicative of pulmonary arterial hypertension. 6.  Emphysema (ICD10-J43.9).     Electronically Signed   By: Newell Eke M.D.   On: 10/18/2023 15:57     OV 05/14/2024  Subjective:  Patient ID: [name], female , DOB: 25-Feb-1941 , age 77 y.o. , MRN: [id number] , ADDRESS: [address] [place] [address] PCP Cleotilde Planas, MD Patient Care Team: Cleotilde Planas, MD as PCP - General (Family Medicine) Almetta Donnice LABOR, MD as PCP - Electrophysiology (Cardiology) Luis Purchase, MD as Consulting Physician (Gastroenterology) Federico Norleen ONEIDA MADISON, MD  as Consulting Physician (Hematology and Oncology)  This Provider for this visit: Treatment Team:  Attending Provider: Geronimo Amel, MD    05/14/2024 -   Chief Complaint  Patient presents with   Interstitial Lung  Disease    Pt states since LOV breathing has gotten worse SOB w/ any activity,  Dry cough Pt states face has been puffy for a while now, thinks its fluid  Also pt stopped taking Bretzi stated it was not helping      HPI MAKESHA BELITZ 83 y.o. -EVA GRIFFO is an 83 year old female who presents with a persistent dry cough and shortness of breath. She is accompanied by her daughte ERIN (a reatlor ) She has been experiencing a persistent dry cough for the past few months, describing it as 'hard' and 'deep.' The cough has progressively worsened over time. She recalls a similar episode a few years ago following an RSV infection, which also resulted in a prolonged cough.  Significant fatigue has been present over the past few months, which she attributes to the persistent cough. The cough disrupts her sleep, waking her at night. Her family has noted wheezing, although she does not hear it herself due to wearing hearing aids.  In addition to the cough, she has experienced shortness of breath for the past three weeks. This occurs with minimal exertion, such as walking from the sofa to the kitchen while carrying dishes, and has progressively worsened, impacting her ability to perform daily activities.  Over the past week, she has noticed facial puffiness and swelling in her hands and feet. Her daughter observed the facial swelling during a recent trip. She has gained approximately seven pounds in the last few weeks, which she attributes to fluid retention, as evidenced by her shoes becoming tight.  No fever, chills, or vomiting, but she experiences some nausea.   Her current medications include Vifortic, prednisone , Bactrim , and Eliquis . She completed a course of prednisone  and azithromycin prescribed by her primary care physician a few weeks ago, which did not alleviate her symptoms. She drinks a lot of water and does not use oxygen at night.  CXR 2d ago visyualized - small new pleft pleural  effusion  Daughter is indy historian  SYMPTOM SCALE - ILD 07/06/2023 11/16/2023  05/14/2024 acute  Current weight     O2 use ra ra ra  Shortness of Breath 0 -> 5 scale with 5 being worst (score 6 If unable to do)    At rest 0 1 4  Simple tasks - showers, clothes change, eating, shaving 1 1.5 4  Household (dishes, doing bed, laundry) 1 1.5 4  Shopping 1 1 4   Walking level at own pace 1 1.5 4  Walking up Stairs 2 2 24   Total (30-36) Dyspnea Score 6 8.5 20  How bad is your cough? 3 2 5   How bad is your fatigue 2 2 4   appetire   3  How bad is nausea 1 0.5 1.5  How bad is vomiting?  0 0 0  How bad is diarrhea? 2 2,5 2.5  How bad is anxiety? 0 0 0  How bad is depression 0 0 0  Any chronic pain - if so where and how bad 1 0 3 fluid retenion        SIT STAND TEST - goal 15 times   11/16/2023    O2 used ra   PRobe - finter or forehead finger   Number sit and stand completed -  goal 15 15   Time taken to complete 45 sec   Resting Pulse Ox/HR/Dyspnea  99% and 63/min and dyspnea of 1/10    Peak measures 99 % and 77/min and dyspnea of 2/10   Final Pulse Ox/HR 96% and 69/min and dyspnea of 1/10   Desaturated </= 88% nono   Desaturated <= 3% points yes   Got Tachycardic >/= 90/min no   Miscellaneous comments x     Simple office walk 224 (66+46 x 2) feet Pod A at [facility] x  3 laps goal with forehead probe 05/14/2024    O2 used ra   Number laps completed 2 of 3   Comments about pace Norma pace   Resting Pulse Ox/HR 95% and 113/min   Final Pulse Ox/HR 94% and 115/min   Desaturated </= 88% no   Desaturated <= 3% points no   Got Tachycardic >/= 90/min yes   Symptoms at end of test dyspneic   Miscellaneous comments X- STOPPED ERARLY      PFT     Latest Ref Rng & Units 07/06/2023   11:26 AM 12/14/2022   10:27 AM 07/08/2022    2:57 PM 04/12/2022   10:27 AM 09/09/2021   10:46 AM 08/18/2020    2:49 PM 02/28/2020    4:16 PM  PFT Results  FVC-Pre L 2.57  2.57  2.52  2.48  2.67   2.78  2.90   FVC-Predicted Pre % 102  102  99  96  103  105  108   Pre FEV1/FVC % % 71  70  73  72  68  68  66   FEV1-Pre L 1.83  1.80  1.84  1.80  1.81  1.88  1.92   FEV1-Predicted Pre % 98  96  97  93  94  96  96   DLCO uncorrected ml/min/mmHg 11.05  11.48  11.51  9.26  11.33  11.74  11.88   DLCO UNC% % 60  62  61  49  60  62  63   DLCO corrected ml/min/mmHg 11.05  11.78  11.93  9.26  12.46  11.74  11.88   DLCO COR %Predicted % 60  63  64  49  66  62  63   DLVA Predicted % 66  71  73  57  70  65  66        LAB RESULTS last 96 hours No results found.       has a past medical history of Anxiety, Atrial fibrillation (HCC), Chest pain, GERD (gastroesophageal reflux disease), Hemochromatosis, History of syncope, Hypothyroidism, and Osteoporosis.   reports that she quit smoking about 26 years ago. Her smoking use included cigarettes. She started smoking about 68 years ago. She has a 42 pack-year smoking history. She has never been exposed to tobacco smoke. She has never used smokeless tobacco.  Past Surgical History:  Procedure Laterality Date   COLONOSCOPY     IR KYPHO LUMBAR INC FX REDUCE BONE BX UNI/BIL CANNULATION INC/IMAGING  06/30/2022   IR KYPHO LUMBAR INC FX REDUCE BONE BX UNI/BIL CANNULATION INC/IMAGING  08/16/2022   MOUTH SURGERY     SHOULDER ARTHROSCOPY Left    TOTAL ABDOMINAL HYSTERECTOMY     UPPER GASTROINTESTINAL ENDOSCOPY     VARICOSE VEIN SURGERY      No Known Allergies  Immunization History  Administered Date(s) Administered    sv, Bivalent, Protein Subunit Rsvpref,pf (Abrysvo) 06/14/2023   Fluad Quad(high Dose  65+) 02/18/2019, 04/21/2022   Fluzone Influenza virus vaccine,trivalent (IIV3), split virus 03/27/2019   INFLUENZA, HIGH DOSE SEASONAL PF 08/29/2017, 04/26/2018, 04/29/2020, 05/12/2021   PFIZER(Purple Top)SARS-COV-2 Vaccination 07/22/2019, 08/12/2019, 03/11/2020   PNEUMOCOCCAL CONJUGATE-20 05/31/2023   Pfizer(Comirnaty)Fall Seasonal Vaccine 12 years  and older 04/18/2024   Pneumococcal Conjugate-13 06/12/2014   Pneumococcal Polysaccharide-23 06/27/2010, 04/25/2018   Tdap 03/16/2011, 07/05/2019   Zoster Recombinant(Shingrix) 04/08/2019   Zoster, Live 06/27/2010, 04/08/2019    Family History  Problem Relation Age of Onset   Stroke Mother    Heart disease Father 46   Hemochromatosis Brother    Breast cancer Neg Hx    Colon cancer Neg Hx    Esophageal cancer Neg Hx    Rectal cancer Neg Hx    Stomach cancer Neg Hx      Current Outpatient Medications:    acetaminophen  (TYLENOL ) 325 MG tablet, Take 650 mg by mouth every 6 (six) hours as needed for mild pain or headache., Disp: , Rfl:    apixaban  (ELIQUIS ) 5 MG TABS tablet, Take 1 tablet (5 mg total) by mouth 2 (two) times daily., Disp: 180 tablet, Rfl: 3   atorvastatin  (LIPITOR) 10 MG tablet, Take 10 mg by mouth daily., Disp: , Rfl:    cetirizine (ZYRTEC) 10 MG tablet, Take 10 mg by mouth daily. (Patient taking differently: Take 10 mg by mouth as needed for allergies or rhinitis.), Disp: , Rfl:    cyanocobalamin  (VITAMIN B12) 500 MCG tablet, Take 500 mcg by mouth daily., Disp: , Rfl:    flecainide  (TAMBOCOR ) 100 MG tablet, TAKE 1 TABLET(100 MG) BY MOUTH TWICE DAILY, Disp: 180 tablet, Rfl: 3   folic acid  (FOLVITE ) 1 MG tablet, Take 1 mg by mouth daily., Disp: , Rfl:    levothyroxine  (SYNTHROID , LEVOTHROID) 112 MCG tablet, Take 112 mcg by mouth daily., Disp: , Rfl:    methocarbamol  (ROBAXIN ) 500 MG tablet, Take 1 tablet (500 mg total) by mouth daily as needed for muscle spasms., Disp: 30 tablet, Rfl: 0   metoprolol  succinate (TOPROL -XL) 50 MG 24 hr tablet, Take 1 tablet (50 mg total) by mouth every evening., Disp: 90 tablet, Rfl: 3   Multiple Vitamins-Minerals (PRESERVISION AREDS 2) CAPS, Take 1 capsule by mouth in the morning and at bedtime., Disp: , Rfl:    mycophenolate  (MYFORTIC ) 360 MG TBEC EC tablet, Take 1 tablet (360 mg total) by mouth 2 (two) times daily., Disp: 180 tablet, Rfl:  3   ondansetron  (ZOFRAN ) 4 MG tablet, Take 1 tablet (4 mg total) by mouth every 8 (eight) hours as needed for nausea or vomiting. (Patient taking differently: Take 4 mg by mouth as needed for nausea or vomiting.), Disp: 90 tablet, Rfl: 3   pantoprazole  (PROTONIX ) 40 MG tablet, Take 1 tablet (40 mg total) by mouth 2 (two) times daily. Take twice daily for 6 weeks , then reduce back to 40mg  daily (Patient taking differently: Take 40 mg by mouth daily. Take twice daily for 6 weeks , then reduce back to 40mg  daily), Disp: 180 tablet, Rfl: 3   Polyethyl Glycol-Propyl Glycol (SYSTANE) 0.4-0.3 % SOLN, 1 drop in each eye Ophthalmic as needed, Disp: , Rfl:    predniSONE  (DELTASONE ) 5 MG tablet, TAKE 1 TABLET(5 MG) BY MOUTH DAILY WITH BREAKFAST, Disp: 90 tablet, Rfl: 1   sulfamethoxazole -trimethoprim  (BACTRIM  DS) 800-160 MG tablet, TAKE 1 TABLET BY MOUTH ON MONDAY, WEDNESDAY AND FRIDAY AS DIRECTED, Disp: 90 tablet, Rfl: 3   Tiotropium Bromide  Monohydrate (SPIRIVA  RESPIMAT) 2.5 MCG/ACT AERS,  Inhale 2 puffs into the lungs daily., Disp: 4 g, Rfl: 8   Vitamin D , Cholecalciferol , 25 MCG (1000 UT) TABS, Take 1 tablet by mouth daily., Disp: , Rfl:    budesonide-glycopyrrolate-formoterol (BREZTRI AEROSPHERE) 160-9-4.8 MCG/ACT AERO inhaler, 4 samples (Patient not taking: Reported on 05/14/2024), Disp: , Rfl:       Objective:   Vitals:   05/14/24 1308  BP: 108/72  Pulse: (!) 105  Temp: 97.9 F (36.6 C)  TempSrc: Oral  SpO2: 93%  Weight: 140 lb 3.2 oz (63.6 kg)  Height: 5' 3 (1.6 m)    Estimated body mass index is 24.84 kg/m as calculated from the following:   Height as of this encounter: 5' 3 (1.6 m).   Weight as of this encounter: 140 lb 3.2 oz (63.6 kg).  @WEIGHTCHANGE @  Filed Weights   05/14/24 1308  Weight: 140 lb 3.2 oz (63.6 kg)     Physical Exam   General: No distress. Looks same but a bit more puffy O2 at rest: no Cane present: no Sitting in wheel chair: no Frail: no Obese:  no Neuro: Alert and Oriented x 3. GCS 15. Speech normal Psych: Pleasant Resp:  Barrel Chest - no.  Wheeze - no, Crackles - YES BASE, No overt respiratory distress CVS: Normal heart sounds. Murmurs - no Ext: Stigmata of Connective Tissue Disease - no HEENT: Normal upper airway. PEERL +. No post nasal drip        Assessment/     Assessment & Plan ILD (interstitial lung disease) (HCC)  Chronic cough  DOE (dyspnea on exertion)  Other fatigue  Edema, unspecified type  Polyuria  On Cellcept  therapy  Current chronic use of systemic steroids  Encounter for therapeutic drug monitoring    PLAN Patient Instructions  Cough Dyspnea Edema Fatigue  - subacue symptoms - did not resolve with antibtics and prednisone  - unclear cause but need to be open to idea that myfortic  can cause fluid retention - need to rule out ILD worsening or cardiopulm issues  Plan  - do blood work  - do echo  - do HRCT  Interstitial pulmonary disease (HCC) Scl-70 antibody positive [name] phenomenon without gangrene High risk medication use Therapeutic drug monitoring    - CLinically stable ILD x  2-3 years . CT  April 2025 with ild stble ILD and emphsye  - Could not tolertate coming off prednisone  - But need to rule out any worsening ILD   Plan -Continue prednisone  5mg  daily -Continue Myfortic  as before - Continue Bactrim  as before -DO HRCT supine and prone next few wweeks   Pulmonary emphysema, unspecified emphysema type (HCC)  Plan -  Continue spiriva    Followup 2-3  weeks s with Dr. Geronimo or APP but after echo and CT to discuss results    FOLLOWUP    Return for 2-3  weeks s with Dr. Geronimo or APP but after echo and CT to discuss results.    SIGNATURE    Dr. Dorethia Geronimo, M.D., F.C.C.P,  Pulmonary and Critical Care Medicine Staff Physician, [facility] Center Director - Interstitial Lung Disease  Program  Pulmonary Fibrosis [facility] at [facility] [place], [place], [address]  Pager: [phone], If no answer or between  15:00h - 7:00h: call [phone] Telephone: [phone]  1:40 PM 05/14/2024

## 2024-05-14 NOTE — Telephone Encounter (Signed)
    BNP high  Plan  - await echo  - lasix 20mg   daily  + 10 meq kcl dailt   - result given to patient 05/14/2024 5:56 PM      Latest Reference Range & Units 05/14/24 14:05  Pro B Natriuretic peptide (BNP) 0.0 - 100.0 pg/mL 754.0 (H)  (H): Data is abnormally high   LABS    PULMONARY No results for input(s): PHART, PCO2ART, PO2ART, HCO3, TCO2, O2SAT in the last 168 hours.  Invalid input(s): PCO2, PO2  CBC Recent Labs  Lab 05/14/24 1405  HGB 12.1  HCT 36.2  WBC 7.4  PLT 215.0    COAGULATION No results for input(s): INR in the last 168 hours.  CARDIAC  No results for input(s): TROPONINI in the last 168 hours. Recent Labs  Lab 05/14/24 1405  PROBNP 754.0*     CHEMISTRY Recent Labs  Lab 05/14/24 1405  NA 139  K 4.1  CL 102  CO2 30  GLUCOSE 109*  BUN 8  CREATININE 0.76  CALCIUM  8.5  MG 1.8  PHOS 3.0   Estimated Creatinine Clearance: 48.7 mL/min (by C-G formula based on SCr of 0.76 mg/dL).   LIVER Recent Labs  Lab 05/14/24 1405  AST 16  ALT 16  ALKPHOS 55  BILITOT 0.7  PROT 6.0  ALBUMIN 3.9     INFECTIOUS No results for input(s): LATICACIDVEN, PROCALCITON in the last 168 hours.   ENDOCRINE CBG (last 3)  No results for input(s): GLUCAP in the last 72 hours.       IMAGING x48h  - image(s) personally visualized  -   highlighted in bold No results found.

## 2024-05-14 NOTE — Patient Instructions (Addendum)
 Cough Dyspnea Edema Fatigue  - subacue symptoms - did not resolve with antibtics and prednisone  - unclear cause but need to be open to idea that myfortic  can cause fluid retention - need to rule out ILD worsening or cardiopulm issues  Plan  - do blood work  - do echo  - do HRCT  Interstitial pulmonary disease (HCC) Scl-70 antibody positive [name] phenomenon without gangrene High risk medication use Therapeutic drug monitoring    - CLinically stable ILD x  2-3 years . CT  April 2025 with ild stble ILD and emphsye  - Could not tolertate coming off prednisone  - But need to rule out any worsening ILD   Plan -Continue prednisone  5mg  daily -Continue Myfortic  as before - Continue Bactrim  as before -DO HRCT supine and prone next few wweeks   Pulmonary emphysema, unspecified emphysema type (HCC)  Plan -  Continue spiriva    Followup 2-3  weeks s with Dr. Geronimo or APP but after echo and CT to discuss results

## 2024-05-15 ENCOUNTER — Other Ambulatory Visit: Payer: Self-pay | Admitting: Internal Medicine

## 2024-05-15 LAB — IGE: IgE (Immunoglobulin E), Serum: 24 kU/L (ref <=114)

## 2024-05-15 LAB — HEMOGLOBIN A1C
Hgb A1c MFr Bld: 5.6 % (ref <5.7)
Mean Plasma Glucose: 114 mg/dL
eAG (mmol/L): 6.3 mmol/L

## 2024-05-18 ENCOUNTER — Ambulatory Visit (HOSPITAL_BASED_OUTPATIENT_CLINIC_OR_DEPARTMENT_OTHER)
Admission: RE | Admit: 2024-05-18 | Discharge: 2024-05-18 | Disposition: A | Discharge status: Home / Self Care | Source: Ambulatory Visit | Attending: Internal Medicine | Admitting: Internal Medicine

## 2024-05-18 DIAGNOSIS — R053 Chronic cough: Secondary | ICD-10-CM | POA: Diagnosis present

## 2024-05-18 DIAGNOSIS — R0609 Other forms of dyspnea: Secondary | ICD-10-CM | POA: Diagnosis present

## 2024-05-18 DIAGNOSIS — R5383 Other fatigue: Secondary | ICD-10-CM | POA: Insufficient documentation

## 2024-05-18 DIAGNOSIS — R609 Edema, unspecified: Secondary | ICD-10-CM | POA: Insufficient documentation

## 2024-05-18 DIAGNOSIS — J849 Interstitial pulmonary disease, unspecified: Secondary | ICD-10-CM | POA: Diagnosis present

## 2024-05-21 ENCOUNTER — Encounter: Payer: Self-pay | Admitting: Student in an Organized Health Care Education/Training Program

## 2024-05-21 ENCOUNTER — Encounter: Payer: Self-pay | Admitting: Physician Assistant

## 2024-05-21 ENCOUNTER — Ambulatory Visit
Attending: Student in an Organized Health Care Education/Training Program | Admitting: Student in an Organized Health Care Education/Training Program

## 2024-05-21 ENCOUNTER — Ambulatory Visit: Admitting: Physician Assistant

## 2024-05-21 VITALS — BP 101/67 | HR 113 | Ht 63.5 in | Wt 130.0 lb

## 2024-05-21 VITALS — BP 97/65 | HR 99 | Temp 97.7°F | Resp 14 | Ht 63.5 in | Wt 130.2 lb

## 2024-05-21 DIAGNOSIS — E559 Vitamin D deficiency, unspecified: Secondary | ICD-10-CM

## 2024-05-21 DIAGNOSIS — M19041 Primary osteoarthritis, right hand: Secondary | ICD-10-CM

## 2024-05-21 DIAGNOSIS — R760 Raised antibody titer: Secondary | ICD-10-CM

## 2024-05-21 DIAGNOSIS — J439 Emphysema, unspecified: Secondary | ICD-10-CM

## 2024-05-21 DIAGNOSIS — Z7952 Long term (current) use of systemic steroids: Secondary | ICD-10-CM

## 2024-05-21 DIAGNOSIS — M7061 Trochanteric bursitis, right hip: Secondary | ICD-10-CM

## 2024-05-21 DIAGNOSIS — I480 Paroxysmal atrial fibrillation: Secondary | ICD-10-CM

## 2024-05-21 DIAGNOSIS — M7062 Trochanteric bursitis, left hip: Secondary | ICD-10-CM

## 2024-05-21 DIAGNOSIS — M19042 Primary osteoarthritis, left hand: Secondary | ICD-10-CM

## 2024-05-21 DIAGNOSIS — M47816 Spondylosis without myelopathy or radiculopathy, lumbar region: Secondary | ICD-10-CM

## 2024-05-21 DIAGNOSIS — J849 Interstitial pulmonary disease, unspecified: Secondary | ICD-10-CM | POA: Diagnosis not present

## 2024-05-21 DIAGNOSIS — S32008S Other fracture of unspecified lumbar vertebra, sequela: Secondary | ICD-10-CM

## 2024-05-21 DIAGNOSIS — Z8639 Personal history of other endocrine, nutritional and metabolic disease: Secondary | ICD-10-CM

## 2024-05-21 DIAGNOSIS — M349 Systemic sclerosis, unspecified: Secondary | ICD-10-CM

## 2024-05-21 DIAGNOSIS — M810 Age-related osteoporosis without current pathological fracture: Secondary | ICD-10-CM

## 2024-05-21 DIAGNOSIS — Z5181 Encounter for therapeutic drug level monitoring: Secondary | ICD-10-CM

## 2024-05-21 DIAGNOSIS — Z8679 Personal history of other diseases of the circulatory system: Secondary | ICD-10-CM

## 2024-05-21 DIAGNOSIS — R918 Other nonspecific abnormal finding of lung field: Secondary | ICD-10-CM

## 2024-05-21 DIAGNOSIS — Z79899 Other long term (current) drug therapy: Secondary | ICD-10-CM

## 2024-05-21 DIAGNOSIS — Z87891 Personal history of nicotine dependence: Secondary | ICD-10-CM

## 2024-05-21 MED ORDER — METHOCARBAMOL 500 MG PO TABS: 500.0000 mg | ORAL_TABLET | Freq: Every day | ORAL | 0 refills | Status: AC | PRN

## 2024-05-21 NOTE — H&P (View-Only) (Signed)
 Cardiology Office Note   Date:  05/21/2024  ID:  [name], [name] [birth year], MRN [id number] PCP: Cleotilde Planas, MD  [facility] Providers Cardiologist:  None Electrophysiologist:  Donnice DELENA Primus, MD   History of Present Illness [name] is a 83 y.o. female with persistent AF on flecainide , pulmonary fibrosis, and orthostatic syncope presents for AF management.   History of Present Illness [name] is an 83 year old female with atrial fibrillation and pulmonary fibrosis who presents with worsening shortness of breath and fluid retention. She is accompanied by her granddaughter, Rocky.  She has a history of atrial fibrillation with increasing frequency of episodes. She has undergone two cardioversions, the last one over six years ago. Palpitations occur when her heart rate increases significantly, but she otherwise does not notice the atrial fibrillation. Her current medications for atrial fibrillation include flecainide  and metoprolol , with the latter recently increased to 50 mg, though she prefers a lower dose.  She has pulmonary fibrosis and scleroderma, with a recent CT scan showing mild pulmonary fibrosis. She experiences significant shortness of breath, particularly with exertion, and has had episodes of facial swelling. Recently, she was prescribed Lasix , resulting in a 14-pound weight loss over 24 hours. Her medications for pulmonary issues include Myfortic , Bactrim , and Spiriva .  She experienced two episodes of syncope, one after attending a senior prom a few years ago and another at home during the night. The first episode was suspected to be due to dehydration. She has a persistent cough that has been treated with a course of prednisone  and antibiotics.  Her current medications include flecainide , metoprolol , Myfortic , Bactrim , Spiriva , and Eliquis . She monitors her heart rhythm using a phone application and adjusts her Lasix  intake based on her  weight and symptoms.  She was last seen by Jodie Passey on 04/18/2024 and was in AF/RVR at that appointment.  She was doing okay until this past Saturday when she noticed significant weight gain and swelling in the face with profound dyspnea on exertion requiring her to stop multiple times while walking.  She has never had issues like this before.  She thought this was related to her pulmonary fibrosis and was evaluated by her pulmonologist and prescribed Lasix .  Her BNP was elevated on lab evaluation.  ROS: SOB/DOE, facial swelling, wt gain   Studies Reviewed  ECG review 05/21/24: AF/RVR 113. QRS 104, QT/c 282/386 04/18/24: AF/RVR 126, QRS 84, QT/c 326/472 11/21/23: NSR 68, PR 216, QRS 86, QT/c 442/469  TTE Result date: 11/21/23  1. Left ventricular ejection fraction, by estimation, is 65 to 70%. The  left ventricle has normal function. The left ventricle has no regional  wall motion abnormalities. There is mild asymmetric left ventricular  hypertrophy of the basal-septal segment.  Left ventricular diastolic parameters are consistent with Grade II  diastolic dysfunction (pseudonormalization).   2. Right ventricular systolic function is normal. The right ventricular  size is normal. Tricuspid regurgitation signal is inadequate for assessing  PA pressure.   3. The mitral valve is grossly normal. Mild mitral valve regurgitation.  No evidence of mitral stenosis.   4. The aortic valve is tricuspid. There is mild calcification of the  aortic valve. Aortic valve regurgitation is mild. Aortic valve sclerosis  is present, with no evidence of aortic valve stenosis.   5. The inferior vena cava is normal in size with greater than 50%  respiratory variability, suggesting right atrial pressure of 3 mmHg.   Risk Assessment/Calculations  CHA2DS2-VASc Score = 3  This indicates a 3.2% annual risk of stroke. The patient's score is based upon: CHF History: 0 HTN History: 0 Diabetes History:  0 Stroke History: 0 Vascular Disease History: 0 Age Score: 2 Gender Score: 1   Physical Exam VS:  BP 101/67   Pulse (!) 113   Ht 5' 3.5 (1.613 m)   Wt 130 lb (59 kg)   SpO2 96%   BMI 22.67 kg/m   Wt Readings from Last 3 Encounters:  05/21/24 130 lb (59 kg)  05/21/24 130 lb 3.2 oz (59.1 kg)  05/14/24 140 lb 3.2 oz (63.6 kg)    GEN: Well nourished, well developed in no acute distress NECK: No JVD; No carotid bruits CARDIAC: irregular rhythm, accelerated rate, no murmurs, rubs, gallops RESPIRATORY:  Clear to auscultation without rales, wheezing or rhonchi  EXTREMITIES:  No edema; No deformity   ASSESSMENT AND PLAN [name] is a 83 y.o. female with persistent AF on flecainide , pulmonary fibrosis, and orthostatic syncope presents for AF management.  Assessment & Plan Persistent atrial fibrillation with heart failure with preserved ejection fraction Persistent AFib exacerbated heart failure symptoms. CT and BNP indicate heart failure. AFib potentially triggered by steroids and possible infection. Elevated heart rate worsens symptoms. Cardioversion recommended to restore rhythm and alleviate symptoms. Discussed cardioversion risks, benefits and alternatives.  Also reviewed risk, benefits and alternatives of ablation however she has not had cardioversion in over 6 years and this episode of persistent AF may have been triggered by URI with subsequent steroid treatment.  For now would not proceed directly to ablation with her age and comorbidities. - Schedule cardioversion. - Continue Lasix  20 mg daily, increase to 40 mg if no diuresis by evening. - Continue Eliquis  for stroke prevention. - Monitor weight and symptoms, adjust Lasix  as needed. - Reassess heart function with echocardiogram post-cardioversion.  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 Symptoms may be exacerbated by heart failure. Discussed heart-lung  interactions due to scleroderma. - Continue Myfortic , Bactrim , and Spiriva . - Monitor respiratory symptoms, adjust treatment as needed.  A total of 50 minutes was spent preparing for the patient, reviewing history, performing exam, document encounter, coordinating care and counseling the patient. 33 minutes was spent with direct patient care.  Informed Consent   Shared Decision Making/Informed Consent The risks (stroke, cardiac arrhythmias rarely resulting in the need for a temporary or permanent pacemaker, skin irritation or burns and complications associated with conscious sedation including aspiration, arrhythmia, respiratory failure and death), benefits (restoration of normal sinus rhythm) and alternatives of a direct current cardioversion were explained in detail to Ms. Wallner and she agrees to proceed.   Dispo: RTC post DCCV  Signed, Donnice DELENA Primus, MD

## 2024-05-21 NOTE — Patient Instructions (Addendum)
 Medication Instructions:  Your physician recommends that you continue on your current medications as directed. Please refer to the Current Medication list given to you today.  *If you need a refill on your cardiac medications before your next appointment, please call your pharmacy*  Lab Work: None ordered.  If you have labs (blood work) drawn today and your tests are completely normal, you will receive your results only by: MyChart Message (if you have MyChart) OR A paper copy in the mail If you have any lab test that is abnormal or we need to change your treatment, we will call you to review the results.  Testing/Procedures: We will call you to schedule cardioversion. Your physician has recommended that you have a Cardioversion (DCCV). Electrical Cardioversion uses a jolt of electricity to your heart either through paddles or wired patches attached to your chest. This is a controlled, usually prescheduled, procedure. Defibrillation is done under light anesthesia in the hospital, and you usually go home the day of the procedure. This is done to get your heart back into a normal rhythm. You are not awake for the procedure. Please see the instruction sheet given to you today.   Follow-Up: At [facility], you and your health needs are our priority.  As part of our continuing mission to provide you with exceptional heart care, our providers are all part of one team.  This team includes your primary Cardiologist (physician) and Advanced Practice Providers or APPs (Physician Assistants and Nurse Practitioners) who all work together to provide you with the care you need, when you need it.  Your next appointment:   1 month with Dr Almetta

## 2024-05-21 NOTE — Progress Notes (Deleted)
  Cardiology Office Note   Date:  05/21/2024  ID:  [name], DOB [birth year], MRN [id number] PCP: Cleotilde Planas, MD  [facility] Providers Cardiologist:  None Electrophysiologist:  Donnice DELENA Primus, MD { Click to update primary MD,subspecialty MD or APP then REFRESH:1}    History of Present Illness [name] is a 83 y.o. female ***  ROS: ***  Studies Reviewed      *** Risk Assessment/Calculations {Does this patient have ATRIAL FIBRILLATION?:[phone]}         Physical Exam VS:  BP 101/67   Pulse (!) 113   Ht 5' 3.5 (1.613 m)   Wt 130 lb (59 kg)   SpO2 96%   BMI 22.67 kg/m        Wt Readings from Last 3 Encounters:  05/21/24 130 lb (59 kg)  05/21/24 130 lb 3.2 oz (59.1 kg)  05/14/24 140 lb 3.2 oz (63.6 kg)    GEN: Well nourished, well developed in no acute distress NECK: No JVD; No carotid bruits CARDIAC: ***RRR, no murmurs, rubs, gallops RESPIRATORY:  Clear to auscultation without rales, wheezing or rhonchi  ABDOMEN: Soft, non-tender, non-distended EXTREMITIES:  No edema; No deformity   ASSESSMENT AND PLAN ***    {Are you ordering a CV Procedure (e.g. stress test, cath, DCCV, TEE, etc)?   Press F2        :[id number]}  Dispo: ***  Signed, Donnice DELENA Primus, MD

## 2024-05-21 NOTE — Progress Notes (Signed)
 Cardiology Office Note   Date:  05/21/2024  ID:  [name], [name] [birth year], MRN [id number] PCP: Cleotilde Planas, MD  [facility] Providers Cardiologist:  None Electrophysiologist:  Donnice DELENA Primus, MD   History of Present Illness [name] is a 83 y.o. female with persistent AF on flecainide , pulmonary fibrosis, and orthostatic syncope presents for AF management.   History of Present Illness [name] is an 83 year old female with atrial fibrillation and pulmonary fibrosis who presents with worsening shortness of breath and fluid retention. She is accompanied by her granddaughter, Rocky.  She has a history of atrial fibrillation with increasing frequency of episodes. She has undergone two cardioversions, the last one over six years ago. Palpitations occur when her heart rate increases significantly, but she otherwise does not notice the atrial fibrillation. Her current medications for atrial fibrillation include flecainide  and metoprolol , with the latter recently increased to 50 mg, though she prefers a lower dose.  She has pulmonary fibrosis and scleroderma, with a recent CT scan showing mild pulmonary fibrosis. She experiences significant shortness of breath, particularly with exertion, and has had episodes of facial swelling. Recently, she was prescribed Lasix , resulting in a 14-pound weight loss over 24 hours. Her medications for pulmonary issues include Myfortic , Bactrim , and Spiriva .  She experienced two episodes of syncope, one after attending a senior prom a few years ago and another at home during the night. The first episode was suspected to be due to dehydration. She has a persistent cough that has been treated with a course of prednisone  and antibiotics.  Her current medications include flecainide , metoprolol , Myfortic , Bactrim , Spiriva , and Eliquis . She monitors her heart rhythm using a phone application and adjusts her Lasix  intake based on her  weight and symptoms.  She was last seen by Jodie Passey on 04/18/2024 and was in AF/RVR at that appointment.  She was doing okay until this past Saturday when she noticed significant weight gain and swelling in the face with profound dyspnea on exertion requiring her to stop multiple times while walking.  She has never had issues like this before.  She thought this was related to her pulmonary fibrosis and was evaluated by her pulmonologist and prescribed Lasix .  Her BNP was elevated on lab evaluation.  ROS: SOB/DOE, facial swelling, wt gain   Studies Reviewed  ECG review 05/21/24: AF/RVR 113. QRS 104, QT/c 282/386 04/18/24: AF/RVR 126, QRS 84, QT/c 326/472 11/21/23: NSR 68, PR 216, QRS 86, QT/c 442/469  TTE Result date: 11/21/23  1. Left ventricular ejection fraction, by estimation, is 65 to 70%. The  left ventricle has normal function. The left ventricle has no regional  wall motion abnormalities. There is mild asymmetric left ventricular  hypertrophy of the basal-septal segment.  Left ventricular diastolic parameters are consistent with Grade II  diastolic dysfunction (pseudonormalization).   2. Right ventricular systolic function is normal. The right ventricular  size is normal. Tricuspid regurgitation signal is inadequate for assessing  PA pressure.   3. The mitral valve is grossly normal. Mild mitral valve regurgitation.  No evidence of mitral stenosis.   4. The aortic valve is tricuspid. There is mild calcification of the  aortic valve. Aortic valve regurgitation is mild. Aortic valve sclerosis  is present, with no evidence of aortic valve stenosis.   5. The inferior vena cava is normal in size with greater than 50%  respiratory variability, suggesting right atrial pressure of 3 mmHg.   Risk Assessment/Calculations  CHA2DS2-VASc Score = 3  This indicates a 3.2% annual risk of stroke. The patient's score is based upon: CHF History: 0 HTN History: 0 Diabetes History:  0 Stroke History: 0 Vascular Disease History: 0 Age Score: 2 Gender Score: 1   Physical Exam VS:  BP 101/67   Pulse (!) 113   Ht 5' 3.5 (1.613 m)   Wt 130 lb (59 kg)   SpO2 96%   BMI 22.67 kg/m   Wt Readings from Last 3 Encounters:  05/21/24 130 lb (59 kg)  05/21/24 130 lb 3.2 oz (59.1 kg)  05/14/24 140 lb 3.2 oz (63.6 kg)    GEN: Well nourished, well developed in no acute distress NECK: No JVD; No carotid bruits CARDIAC: irregular rhythm, accelerated rate, no murmurs, rubs, gallops RESPIRATORY:  Clear to auscultation without rales, wheezing or rhonchi  EXTREMITIES:  No edema; No deformity   ASSESSMENT AND PLAN [name] is a 83 y.o. female with persistent AF on flecainide , pulmonary fibrosis, and orthostatic syncope presents for AF management.  Assessment & Plan Persistent atrial fibrillation with heart failure with preserved ejection fraction Persistent AFib exacerbated heart failure symptoms. CT and BNP indicate heart failure. AFib potentially triggered by steroids and possible infection. Elevated heart rate worsens symptoms. Cardioversion recommended to restore rhythm and alleviate symptoms. Discussed cardioversion risks, benefits and alternatives.  Also reviewed risk, benefits and alternatives of ablation however she has not had cardioversion in over 6 years and this episode of persistent AF may have been triggered by URI with subsequent steroid treatment.  For now would not proceed directly to ablation with her age and comorbidities. - Schedule cardioversion. - Continue Lasix  20 mg daily, increase to 40 mg if no diuresis by evening. - Continue Eliquis  for stroke prevention. - Monitor weight and symptoms, adjust Lasix  as needed. - Reassess heart function with echocardiogram post-cardioversion.  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 Symptoms may be exacerbated by heart failure. Discussed heart-lung  interactions due to scleroderma. - Continue Myfortic , Bactrim , and Spiriva . - Monitor respiratory symptoms, adjust treatment as needed.  A total of 50 minutes was spent preparing for the patient, reviewing history, performing exam, document encounter, coordinating care and counseling the patient. 33 minutes was spent with direct patient care.  Informed Consent   Shared Decision Making/Informed Consent The risks (stroke, cardiac arrhythmias rarely resulting in the need for a temporary or permanent pacemaker, skin irritation or burns and complications associated with conscious sedation including aspiration, arrhythmia, respiratory failure and death), benefits (restoration of normal sinus rhythm) and alternatives of a direct current cardioversion were explained in detail to [name] and she agrees to proceed.   Dispo: RTC post DCCV  Signed, Donnice DELENA Primus, MD

## 2024-05-28 ENCOUNTER — Ambulatory Visit: Admitting: Rheumatology

## 2024-05-29 ENCOUNTER — Other Ambulatory Visit: Payer: Self-pay

## 2024-05-29 ENCOUNTER — Encounter (HOSPITAL_COMMUNITY)
Admission: RE | Disposition: A | Discharge status: Home / Self Care | Payer: Self-pay | Source: Home / Self Care | Attending: Cardiology

## 2024-05-29 ENCOUNTER — Encounter (HOSPITAL_COMMUNITY): Payer: Self-pay | Admitting: Cardiology

## 2024-05-29 ENCOUNTER — Ambulatory Visit (HOSPITAL_COMMUNITY)
Admission: RE | Admit: 2024-05-29 | Discharge: 2024-05-29 | Disposition: A | Attending: Cardiology | Admitting: Cardiology

## 2024-05-29 ENCOUNTER — Ambulatory Visit (HOSPITAL_COMMUNITY)

## 2024-05-29 DIAGNOSIS — J439 Emphysema, unspecified: Secondary | ICD-10-CM

## 2024-05-29 DIAGNOSIS — E039 Hypothyroidism, unspecified: Secondary | ICD-10-CM

## 2024-05-29 DIAGNOSIS — I4891 Unspecified atrial fibrillation: Secondary | ICD-10-CM

## 2024-05-29 DIAGNOSIS — Z006 Encounter for examination for normal comparison and control in clinical research program: Secondary | ICD-10-CM

## 2024-05-29 DIAGNOSIS — F419 Anxiety disorder, unspecified: Secondary | ICD-10-CM | POA: Diagnosis not present

## 2024-05-29 DIAGNOSIS — Z87891 Personal history of nicotine dependence: Secondary | ICD-10-CM

## 2024-05-29 DIAGNOSIS — I480 Paroxysmal atrial fibrillation: Secondary | ICD-10-CM

## 2024-05-29 HISTORY — PX: CARDIOVERSION: EP1203

## 2024-05-29 MED ORDER — SODIUM CHLORIDE 0.9% FLUSH
INTRAVENOUS | Status: DC | PRN
  Administered 2024-05-29: 3 mL via INTRAVENOUS

## 2024-05-29 MED ORDER — SODIUM CHLORIDE 0.9% FLUSH: 3.0000 mL | INTRAVENOUS | Status: DC | PRN

## 2024-05-29 MED ORDER — PROPOFOL 10 MG/ML IV BOLUS
INTRAVENOUS | Status: DC | PRN
  Administered 2024-05-29: 40 mg via INTRAVENOUS

## 2024-05-29 MED ORDER — SODIUM CHLORIDE 0.9% FLUSH: 3.0000 mL | Freq: Two times a day (BID) | INTRAVENOUS | Status: DC

## 2024-05-29 MED ORDER — LIDOCAINE 2% (20 MG/ML) 5 ML SYRINGE
INTRAMUSCULAR | Status: DC | PRN
  Administered 2024-05-29: 60 mg via INTRAVENOUS

## 2024-05-29 MED ORDER — SODIUM CHLORIDE 0.9 % IV SOLN: 250.0000 mL | INTRAVENOUS | Status: DC | PRN

## 2024-05-29 NOTE — Anesthesia Preprocedure Evaluation (Signed)
 Anesthesia Evaluation  Patient identified by MRN, date of birth, ID band Patient awake    Reviewed: Allergy & Precautions, H&P , NPO status , Patient's Chart, lab work & pertinent test results  History of Anesthesia Complications Negative for: history of anesthetic complications  Airway Mallampati: II  TM Distance: >3 FB Neck ROM: Full    Dental no notable dental hx.    Pulmonary neg shortness of breath, COPD, former smoker Pulmonary emphysema    Pulmonary exam normal breath sounds clear to auscultation       Cardiovascular (-) angina (-) Past MI + dysrhythmias Atrial Fibrillation  Rhythm:Irregular Rate:Tachycardia     Neuro/Psych neg Seizures PSYCHIATRIC DISORDERS Anxiety     negative neurological ROS     GI/Hepatic Neg liver ROS,GERD  ,,  Endo/Other  Hypothyroidism    Renal/GU negative Renal ROS  negative genitourinary   Musculoskeletal negative musculoskeletal ROS (+)    Abdominal   Peds negative pediatric ROS (+)  Hematology Last eliquis  dose 12/3   Anesthesia Other Findings   Reproductive/Obstetrics negative OB ROS                              Anesthesia Physical Anesthesia Plan  ASA: 3  Anesthesia Plan: General   Post-op Pain Management:    Induction: Intravenous  PONV Risk Score and Plan: 3 and Treatment may vary due to age or medical condition  Airway Management Planned: Natural Airway and Simple Face Mask  Additional Equipment: None  Intra-op Plan:   Post-operative Plan: Extubation in OR  Informed Consent: I have reviewed the patients History and Physical, chart, labs and discussed the procedure including the risks, benefits and alternatives for the proposed anesthesia with the patient or authorized representative who has indicated his/her understanding and acceptance.     Dental advisory given  Plan Discussed with: CRNA  Anesthesia Plan Comments:           Anesthesia Quick Evaluation

## 2024-05-29 NOTE — Interval H&P Note (Signed)
 History and Physical Interval Note:  05/29/2024 10:04 AM  [name]  has presented today for surgery, with the diagnosis of AFIB.  The various methods of treatment have been discussed with the patient and family. After consideration of risks, benefits and other options for treatment, the patient has consented to  Procedure(s): CARDIOVERSION (N/A) as a surgical intervention.  The patient's history has been reviewed, patient examined, no change in status, stable for surgery.  I have reviewed the patient's chart and labs.  Questions were answered to the patient's satisfaction.     Izen Petz

## 2024-05-29 NOTE — Transfer of Care (Signed)
 Immediate Anesthesia Transfer of Care Note  Patient: [name]  Procedure(s) Performed: CARDIOVERSION  Patient Location: Cath Lab  Anesthesia Type:General  Level of Consciousness: awake, alert , and oriented  Airway & Oxygen Therapy: Patient Spontanous Breathing and Patient connected to nasal cannula oxygen  Post-op Assessment: Report given to RN, Post -op Vital signs reviewed and stable, and Patient moving all extremities X 4  Post vital signs: Reviewed and stable  Last Vitals:  Vitals Value Taken Time  BP    Temp    Pulse    Resp    SpO2      Last Pain:  Vitals:   05/29/24 1149  TempSrc:   PainSc: 0-No pain         Complications: No notable events documented.

## 2024-05-29 NOTE — Research (Signed)
 [facility] Cardioversion Informed Consent   Subject Name: [name]  Subject met inclusion and exclusion criteria.  The informed consent form, study requirements and expectations were reviewed with the subject and questions and concerns were addressed prior to the signing of the consent form.  The subject verbalized understanding of the trial requirements.  The subject agreed to participate in the [facility] Cardioversion trial and signed the informed consent at 1118 on 03/Dec/2025.  The informed consent was obtained prior to performance of any protocol-specific procedures for the subject.  A copy of the signed informed consent was given to the subject and a copy was placed in the subject's medical record.   [name] [name]

## 2024-05-29 NOTE — Anesthesia Postprocedure Evaluation (Signed)
 Anesthesia Post Note  Patient: [name]  Procedure(s) Performed: CARDIOVERSION     Patient location during evaluation: PACU Anesthesia Type: General Level of consciousness: awake and alert Pain management: pain level controlled Vital Signs Assessment: post-procedure vital signs reviewed and stable Respiratory status: spontaneous breathing, nonlabored ventilation, respiratory function stable and patient connected to nasal cannula oxygen Cardiovascular status: blood pressure returned to baseline and stable Postop Assessment: no apparent nausea or vomiting Anesthetic complications: no   No notable events documented.  Last Vitals:  Vitals:   05/29/24 1204 05/29/24 1205  BP:  (!) 91/58  Pulse:  73  Resp:  (!) 27  Temp: 36.8 C   SpO2:  94%    Last Pain:  Vitals:   05/29/24 1204  TempSrc: Temporal  PainSc: 0-No pain                 Thom JONELLE Peoples

## 2024-06-05 NOTE — Patient Instructions (Addendum)
 Cough Dyspnea Edema Fatigue  - subacue symptoms in Nov 2025 -> BNP 700 and much improved but nto fully as of 06/06/2024 - concern is worsening diastolic chf or new onset systolic CHF esp in setting of recent ATrial Fibrillation - ILD itslf seems table on HRCT  Plan  - do blood work for bnp 06/06/2024 as followup  - awiait echo results   Interstitial pulmonary disease (HCC) Scl-70 antibody positive [name] phenomenon without gangrene High risk medication use Therapeutic drug monitoring    - CLinically stable ILD x  2-3 years . CT  April 2025 and Dec 2025 with ild stble ILD and emphsye  - Could not tolertate coming off prednisone   Plan -Continue prednisone  5mg  daily -Continue Myfortic  as before but checking with Cheryl Birmingham if we can stop this - Continue Bactrim  as before  - keep pft appotimet in Jan 2026   Pulmonary emphysema, unspecified emphysema type [facility])  Plan -  Continue spiriva    Followup Jan 2026 with Dr Geronimo as already scheduled

## 2024-06-05 NOTE — Progress Notes (Signed)
 PCP [name],[name], PA-C   HPI  IOV 02/10/2016  Chief Complaint  Patient presents with   Pulmonary Consult    Pt referred by Dr. Sophronia for chronic cough x 1 year. Pt states she feels she has a tickle that is causing her dry cough. Pt states she has DOE when climbing stairs. Pt deneis CP/tightness.     83 year old female with hemochromatosis homozygous gene followed by Dr. Freddie (last phlebotomy many years ago and is on serial monitoring) with children and siblings with active disease. She has also atrial fibrillation followed by Dr. Fernande. Reports insidious onset of chronic cough in the last 1 year. It is stable since onset. It fluctuates between mild and severe in severity. It is mostly dry in quality. Mostly present in the daytime but sometimes also wakes her up at night. It is definitely not progressive. It is episodic and present every day. Aggravated by talking sometimes and then the throat feels dry associated with tickle in the throat and also sensation the cough is coming from the upper chest retrosternally and sometimes relieved by drinking water or chewing on a lozenge. Also aggravated by seasonal changes particularly in the spring and the fall which makes her think she has allergies. There is sometimes associated gag. She also reports nonspecific occasional wheezing and associated shortness of breath that is nonspecific but present with exertion and relieved by rest No other clear cut aggravating or relieving factors.   cough associated history  - Medications: She is not on fish oil or ACE inhibitors - Sinus drainage: She does admit to spring allergies. She did have something removed from her hard palate several years ago by ENT does not know details - Acid reflux: She says she is significant acid reflux and is on Prilosec. Without which she'll have significant symptoms - Pulmonary disease: FenO 12 02/10/2016  and normal. She denies any personal history of asthma or  pulmonary fibrosis of COPD or emphysema smoked one pack per day started smoking at age 44 and quit in 1999. Making a 42 pack smoking history. Chest x-ray 08/26/2014 personally visualized is clear - Tobacco :  reports that she quit smoking about 18 years ago. She has a 42.00 pack-year smoking history. She has never used smokeless tobacco.       OV 02/18/2016  Chief Complaint  Patient presents with   Follow-up    Pt here after PFT and HRCT. Pt denies changes in SOB and cough. Pt denies any new complaints at this time.    Follow-up chronic cough. This visit is to follow-up on test results of function test and high resolution CT chest which are described below9 results show isolated reduction in diffusion capacity which can be explained by possible early ILD and also emphysema. There are also new findings of nodule 6 mm bilaterally on lower lobes. She prefers a very cautious approach to this evaluation.     Pulmonary function test 02/12/2016 - FVC 2.8 cm/101%, FEV1 1.9 L/91%. Ratio 60/90%. Total lung capacity 115%. DLCO reduced at 14.5/60%. She is isolated reduction in diffusion capacity    HRCT chest 02/16/16 IMPRESSION: 1. Mild patchy subpleural reticulation in both lungs with a basilar predominance. No significant traction bronchiectasis. No frank honeycombing. Findings could represent an interstitial lung disease such as nonspecific interstitial pneumonia (NSIP), with early usual interstitial pneumonia (UIP) not excluded. A follow-up high-resolution chest CT study in 12 months is recommended to assess temporal pattern stability. 2. Bilateral lower  lobe solid pulmonary nodules, largest 6 mm. Non-contrast chest CT at 3-6 months is recommended. If the nodules are stable at time of repeat CT, then future CT at 18-24 months (from today's scan) is considered optional for low-risk patients, but is recommended for high-risk patients. This recommendation follows the consensus statement:  Guidelines for Management of Incidental Pulmonary Nodules Detected on CT Images:From the Fleischner Society 2017; published online before print (10.1148/radiol.[id number]). 3. Additional findings include mild-to-moderate centrilobular emphysema, aortic atherosclerosis and 2 vessel coronary atherosclerosis.     Electronically Signed   By: Selinda DELENA Blue M.D.   On: 02/16/2016 14:14    OV 08/31/2016  Chief Complaint  Patient presents with   Follow-up    HRCT was never scheduled. Pt states that the cough is still present >> slightly improved since last OV. Pt states that she feels her allergies has gotten it ramped up again.    Follow-up multifactorial cough associated with autoimmune antibody positive, interstitial lung disease and emphysema previous history of smoking  After last visit in August 2017 she had high resolution CT chest in December 2017 that showed persistence of ILD. I personally visualized the CT chest. He comes for follow-up. She tells me that since August 2017 she has insidious onset of shortness of breath and is progressively worse. It is mild and present only on inclines is not therefore tenderness. In terms of her cough is persistent. It is associated with significant sinus drainage that she thinks is allergy related. She constantly clears her throat. Lab review shows autoimmune antibody positive in August 2017 with scleroderma antibody slightly elevated at 6.4. I referred her to rheumatology but she does not remember this and she did not make this follow-up. She also tells me that she definitely has a long history of [name] phenomena in her fingers but no one has ever formally diagnosed with connective tissu  e disease.  OV 11/28/2016  Chief Complaint  Patient presents with   Follow-up    Pt here after PFT. Pt states her breathing is uchanged since last OV. Pt c/o dry cough.- pt states this has slightly improved since last OV. Pt denies CP/tightness and f/c/s.      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 Since then she has seen rheumatologis Dr. JONETTA. I reviewd the notes and also discussed with the patient wa a good understading of what s going on. She tells me that some of the lupus antibodies have been positive correlating with Raynaud. Patient is on beta blocker for atrial fbrillation and it is the preference by the rheumatologist Dr. D that she switc to calcium  channel blocker. She has a follow-up appointment pending with her electrophysiologist Dr. Fernande.  She is scleroderma antibody positive but according to her rheumtolgist she does not have dermatologic  Manifestation of the disese. 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 She pulm  function test today and this is stable. In terms of arrest or symptoms she stable. The cough is actually improved. But she does get dyspneic walking up stairs especially a few flights. She does not wnt her to pulmonary habilitation because se exrcises on the treadmill although she does not monitor her saturations.   reports that she quit smoking about 19 years ago. She has a 42.00 pack-year smoking history. she has never used smokeless tobacco.   OV  06/12/2017  Chief Complaint  Patient presents with   Follow-up    Pt states that she has been doing good since last visit. States that she has a tickle cough that is sporadic and has SOB that she states is when she climbs 2 flights of stairs. Denies any CP.    Follow-up combined emphysema was interstitial lung disease [autoimmune undifferentiated connective tissue disease interstitial lung disease]. Autoimmune features from May 2018 rheumatology noted with Dr. JONETTA: Raynaud phenomenon positive without gangrene, positive anti-cardiolipin and positive IgM and SCL-70 positive without clinical features of  scleroderma    Last seen June 2018. Since then she's stable. Overall she tells me that only problem is a tickle in her throat and slight cough. This is up and down depending on the pollen exposure she gets. She gets dyspneic for climbing few to several flights of stairs. This is unchanged. She did have full function test today and that shows mild significant worsening though overall gradient is only mild.. However she's not feeling this. She has appointment with Dr. JONETTA  pending    OV 07/18/2017  Chief Complaint  Patient presents with   Follow-up    HRCT done 07/03/17.  Pt states she is the same as she was at last visit. SOB with exertion.    Follow-up combined emphysema was interstitial lung disease [autoimmune undifferentiated connective tissue disease interstitial lung disease]. Autoimmune features from May 2018 rheumatology noted with Dr. JONETTA: Raynaud phenomenon positive without gangrene, positive anti-cardiolipin and positive IgM and SCL-70 positive without clinical features of scleroderma    ANIYIA RANE presents for follow-up.  She is here to discuss the results of a high-resolution CT scan of the chest.  The scan was reviewed by Dr. Jeremy thoracic radiology who feels that patient has probable UIP pattern that is definitely progressive compared to August 2017 although there is no comment about progression since June 2018 CT scan.  The pulmonary nodules itself are stable since August 2017 and are likely benign.  The change in the CT scan which is mild progression pulmonary fibrosis corresponds with the pulmonary function test that shows mild progression.  She is now here with her daughter Rocky and her husband.  She also states that 1 of her other daughters has rheumatoid arthritis and is on TNF alpha blockade.  All her 3 daughters have hemo-chromatosis    OV 08/29/2017  Chief Complaint  Patient presents with   Follow-up    Pt states she has been doing good since last visit. Pt  recently went to [place]  and had some mild problems with SOB due to heat.  Pt was prescribed cellcept  and bactrim  but has not yet started meds due to questions with other meds she is taking.    83 year old female with ILD secondary to autoimmune disease clinically suspicious of scleroderma but also previous history of hemochromatosis with family history of hemochromatosis.  She is here with her husband.  This visit is only a discussion visit.  Because she has many questions about starting CellCept  before she actually does.  We did hepatitis virus panel, QuantiFERON gold, G6PD and all this is normal.  Lab work is normal.  I checked with [facility] pharmacist about interactions and was cleared for her to start CellCept .  The only recommendation was for patient to come off Prilosec because of reduced effect of CellCept .  Patient questions revolved around CellCept : She does not want to come off PPI because of hiatal hernia and has had  bad acid reflux.  So I would not Isaiah Medley asking for any alternative PPI.  In addition she states she takes multiple multivitamins including Biotene, vitamin D3, vitamin D , vitamin B12 and folic acid .  She also takes Allegra occasionally.  She wants to make sure it is all okay with CellCept .  She has not had a flu shot today and she is asking if she should have it.  She has never had flu shot before.  She is also wondering about anticoagulation in the setting of CellCept  and thyroid  issues in the setting of Bactrim .  In terms of her genetics: Initially I corresponded with our local geneticist who thought she might be better served at [facility] but the [facility] genetics person wrote to me saying that patient should first be seen by [facility] rheumatology and hematology.  Patient's not so sure she wants to see the subspecialist.  She will speak to Dr. Freddie her local hematologist to see if it is worthwhile seeing the hematology department at [facility].   Lungs/Pleura: Moderate centrilobular emphysema. Mild basilar predominant subpleural reticulation and ground-glass, increased from 02/16/2016. No traction bronchiectasis/ bronchiolectasis, architectural distortion or honeycombing. Side-by-side nodules in the medial left lower lobe measure up to 6 mm (series 3, image 95), unchanged from 02/16/2016 and considered benign. No air trapping. No pleural fluid. Airway is unremarkable.  IMPRESSION: 1. Mild progression in basilar predominant subpleural fibrosis from 02/16/2016, raising suspicion for usual interstitial pneumonitis. 2. Aortic atherosclerosis (ICD10-170.0). Moderate coronary artery calcification. 3.  Emphysema (ICD10-J43.9).     Electronically Signed   By: Newell Eke M.D.   On: 07/03/2017 09:54    OV 09/19/2017  Chief Complaint  Patient presents with   Follow-up    Pt states she has been doing good. Currently on cellcept  and bactrim  and has been doing well on it once put back on omeprazole. Pt still has the cough and sometimes when trying to take a deep breath can't fully get all O2 out.   Mathea Frieling returns for follow-up.  This is for autoimmune interstitial lung disease.  She was started on CellCept  recently.  She is here to follow-up for therapeutic drug monitoring.  Because she was on omeprazole and we were initiating CellCept  with Bactrim  we asked her to hold off on omeprazole because the omeprazole impairs drug levels of CellCept .  However she tried it for a week and her acid reflux was significant despite ranitidine so she went back on omeprazole.  Pharmacist Isaiah Medley is advising changing the CellCept  to equivaekbtdose of Myfortic . So far tolerating cellcept /pred/bactrimm well. Has seen dR G in heme and on observation. Had PFT today on cellcept /pred/bactrim  and is better! Continue siwht spriva  OV 10/31/2017  . Chief Complaint  Patient presents with   Follow-up    Pt currently taking  myfortic  and states she has been well on that. Pt states she has some mild nausea and has some problems with sleeping at night. SOB is stable,mild coughing. Denies any CP.    Follow-up progressive interstitial lung disease with SCL-70 antibody positive and Raynard Follow-up associated emphysema.  Last visit September 19, 2017.  At that time she had intolerance to the CellCept  therefore we switched her to Myfortic .  She is here to report for follow-up about this.  She is tolerating Myfortic  just well.  She had some transient insomnia for a few days last week but this resolved.  She only has mild intermittent occasional nausea with the Myfortic  but  otherwise is tolerating it really well at the low-dose of 360 mg twice daily.  She continues on prednisone  10 mg daily and Bactrim  3 times a week.  She is due for blood work today.  There are no other new issues.  She continues on Spiriva  for her emphysema   OV 02/01/2018  Chief Complaint  Patient presents with   Follow-up    PFT performed today.  Pt stated other than having shingles earlier, things have been doing good for her. Pt states SOB is stable and states she still has an occ cough.    Follow-up progressive interstitial lung disease with SCL-70 antibody positive and Raynud - IPAF Follow-up associated emphysema   TRAVIS PURK presents for routine follow-up with interstitial lung disease clinic. She has the above problems. In the interim she tells me she had developed shingles on the right neck but this is resolved. This is despite having zoster vaccine in 2012. She was yet to have the bnnew  shingles vaccine.in any event currently she is feeling well. She only has occasional post residual neuropathic pain. In terms of her shortness of breath it is stable and minimal. Walking desaturation test shows stability. Pulmicort function test below show stability. She is tolerating the mycophenolate  myfortic  well at 360 mg twice daily and prednisone  10 mg  daily and Bactrim  for PCP prophylaxis. Overall she feels stable and feels that it is best medicines on touch in terms of dosing change  OV 08/14/2018  Subjective:  Patient ID: [name], female , DOB: 1941/04/10 , age 41 y.o. , MRN: [id number] , ADDRESS: [address] [place] [address]   08/14/2018 -   Chief Complaint  Patient presents with   Follow-up    PFT performed today.  Pt states she is slowly getting better after her recent pna. States she still has some issues with SOB and an occ cough. Pt also states that she feels like she has a weight on her chest at times.    Follow-up progressive interstitial lung disease with SCL-70 antibody positive and Raynud - IPAF - on myfortic  since march 2019 with pred 10 and bactrim   Follow-up associated emphysema - on spiriva     HPIfortic COTI BURD 83 y.o. -returns for 17-month follow-up of her interstitial lung disease associated with emphysema.  She presents with her middle daughter Rocky who is met me before.  She continues on Myfortic  and prednisone  10 mg and Bactrim  prophylaxis.  She continues on Spiriva .  She had an episode of atrial fibrillation according to review of the chart and my on history with in the middle of January 2020.  At this time she had a chest x-ray that showed asymptomatic upper lobe pneumonia.  I personally visualized the chest x-ray and confirmed the findings.  Antibiotic was not prescribed.  Then I follow this up with a CT scan of the chest [we canceled her elective colonoscopy].  CT chest showed improved pneumonia findings.  I prescribed even though she was feeling well cephalexin  for a week.  She says she took it.  She continues to feel well except she has baseline amount of symptoms that is documented below.  Of note the high-resolution CT chest showed an improvement in ILD compared to a year ago.  In fact her pulmonary function tests are also showing stability/improvement as documented below.  She is very  happy about this outcome with immunosuppression .  However, she is a little bit unsure why she is  symptomatic in terms of shortness of breath and cough.  She is very concerned that her fibrosis is getting worse.  She wants to have closer follow-up than every 6 months.   IMPRESSION: HRCT Jan 2020 1. Vaguely bandlike patchy consolidation and ground-glass opacity at the periphery of the right upper and superior segment right lower lobes, new since 07/03/2017 chest CT, improving compared to 07/10/2018 chest radiograph, favoring resolving pneumonia with evolving postinfectious scarring. 2. Previously described basilar predominant subpleural reticulation and ground-glass attenuation is minimal and appears decreased in the interval. No bronchiectasis or honeycombing. Findings may represent an interstitial lung disease such as nonspecific interstitial pneumonia (NSIP) or less likely early usual interstitial pneumonia (UIP). Findings are indeterminate for UIP per consensus guidelines: Diagnosis of Idiopathic Pulmonary Fibrosis: An Official ATS/ERS/JRS/ALAT Clinical Practice Guideline. Am JINNY Honey Crit Care Med Vol 198, Iss 5, [phone], Feb 25 2017. 3. Moderate centrilobular emphysema with mild diffuse bronchial wall thickening, suggesting COPD. 4. Two-vessel coronary atherosclerosis. 5. Small hiatal hernia.   Aortic Atherosclerosis (ICD10-I70.0) and Emphysema (ICD10-J43.9). ROS - per HPI    OV 04/22/2019  Subjective:  Patient ID: [name], female , DOB: 1940/08/31 , age 54 y.o. , MRN: [id number] , ADDRESS: [address] [place] [address]   Follow-up slowly progressive interstitial lung disease with SCL-70 antibody positive and Raynud - IPAF - on myfortic  since march 2019 with pred 5 and bactrim  3x/week  Follow-up associated emphysema - on spiriva    04/22/2019 -   Chief Complaint  Patient presents with   Follow-up    Patient reports that her breathing is doing well  at this time.      HPI JAQUISHA FRECH 83 y.o. -presents for follow-up.  Last seen by myself in early part of 2020 before the pandemic.  At that time CT chest suggested possible progression.  She saw a nurse practitioner in the interim after the onset of the pandemic.  She was anxious about the progression of her ILD so she asked for a CT scan in less than 1 year.  A CT scan of the chest was done in September 2020.  This is deemed stable since early part of the year but with possible progression versus stability since January 2019.  In terms of her symptoms she continues to be stable with very minimal shortness of breath and cough.  Her pulmonary function test below also shows stability.  Her walking desaturation test also shows stability.  She is tolerating her Myfortic , prednisone  and 3 times a week of Bactrim  just fine.  There are no side effects.  Last blood work was in August 2020 and this was reviewed.  We discussed the possibility of switching to antifibrotic nintedanib instead of taking immunosuppressants.  The rational for this would be it is a safer drug with less long-term side effect such as lack of immunosuppression.  However she feels her current regimen is working well for her.  She also feels that she would benefit more from a immunosuppressive drug because of her autoimmune antibodies and therefore she is more inclined to continue with her current immunosuppressive regimen of prednisone  5 mg/day and Myfortic  and Bactrim  3 times a week.        OV 07/04/2019  Subjective:  Patient ID: [name], female , DOB: 15-Nov-1940 , age 21 y.o. , MRN: [id number] , ADDRESS: [address] [place] [address]   Follow-up mildy progressive - stable interstitial lung disease with SCL-70 antibody positive and  Raynud - IPAF - on myfortic  since march 2019 with pred 5 and bactrim  3x/week  Follow-up associated emphysema  Follow-up Raynard  Has associated hemochromatosis  Has  associated atrial fibrillation on Xarelto    07/04/2019 -   Chief Complaint  Patient presents with   Follow-up    Pt states she has been doing well since last visit and denies any complaints.     HPI [name] 83 y.o. -last visit was in October 2020.  At that time she was doing well.  Given her continued immunosuppression with prednisone  and Myfortic  we discussed the possibility of switching the Myfortic  to the nintedanib but understanding that there uncertainties whether this is superior approach and if she could end up with temporary side effects of the nintedanib.  Based on that she decided to continue with prednisone , Myfortic  and Bactrim  as she was doing because things are working well.  However she later called in and wanted this change to nintedanib.  We did this change to nintedanib but then her co-pay ended up being over 500+ dollars and then she started also getting worried about the side effects with nintedanib particularly GI.  She is also on Xarelto  for atrial fibrillation.  Therefore she made this visit ahead of schedule to discuss the possibility of making the switch.  I understand from her that it will be 500+ dollars every month with nintedanib as a co-pay.  In addition she is worried about the superiority of taking nintedanib.  She fully understands that Myfortic  over length of time can carry cancer risk and immunosuppression risk but at this point in time she feels well and she is tolerating it well.  She also understands that nintedanib can carry temporary GI side effects.  She also understands that it may not be a superior approach.  She understands it could be an equivalent approach and definitely lower immunosuppression.  Her main systemic immunosuppressive features are lung disease and Raynaud.  She is worried this might flareup.  In terms of her symptoms there is stable as documented below.  Her walking desaturation test is also stable.   Last pulmonary function test  was in August r 2020 Last CT scan of the chest was in September 2020 Last blood work October 2020 Last echocardiogram 2012.  ROS - per HPI  OV 10/22/2019  Subjective:  Patient ID: [name], female , DOB: [birth year] , age 69 y.o. , MRN: [id number] , ADDRESS: [address] [place] [address]   Follow-up mildy progressive - stable interstitial lung disease with SCL-70 antibody positive and Raynud - IPAF - on myfortic  since march 2019 with pred 5 and bactrim  3x/week  Follow-up associated emphysema  Follow-up Raynaud  Has associated hemochromatosis  Has associated atrial fibrillation on Xarelto     10/22/2019 -   Chief Complaint  Patient presents with   Follow-up    PFT performed today. Pt states she has been doing good since last visit. Denies any complaints with breathing.     HPI SADHANA FRATER 83 y.o. -returns for routine follow-up.  Overall doing well.  She feels shortness of breath is stable.  She feels she is tolerating Myfortic  and prednisone  fine no new interim issues she has had a Covid vaccine.  She continues on high risk immunosuppressive therapy.  There is some mild diarrhea with the Myfortic  but it is tolerable.  The shortness of breath scale is stable walking desaturation test is stable.  Her pulmonary function test shows stable  FVC with a possible reduction in diffusion capacity.  I do not know what to make of this at this point in time.  It could be a technical issue because she is feeling stable.  She did have echocardiogram that showed grade 2 diastolic dysfunction.   She continues on her Spiriva  for associated emphysema.  ROS - per HPI     OV 03/03/2020   Subjective:  Patient ID: [name], female , DOB: June 11, 1941, age 65 y.o. years. , MRN: [id number],  ADDRESS: [address] 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 -returns for follow-up.  Overall she is doing well.  However in the last 1-2 months she says she is slightly more short of breath than usual.  She is also having slight increase in dry cough.  Based on her subjective symptom questionnaires it seems the cough itself is stable but she is definitely saying it is worse.  She is seeing the dyspnea is overall stable compared to the cough but looking at the symptom questionnaire is maybe it is a little worse.  Is hard to discern.  It has been approximately 1 year since she had a high-resolution CT chest.  She is wondering if she can have this repeated.  She is on immunosuppression.  She is also noticing -that at home her pulse oximetry on her finger varies a lot [she does have [name]]) and sometimes can be as low as 88% - 93%.  She does acknowledge that the circulation in her hands is not as good.  Pulse ox here at rest was normal.  She had pulmonary function test that shows stability.-With the FVC although I am not sure what to make of the DLCO.  She has had a Covid booster vaccine  Last echocardiogram January 2021 with mild diastolic dysfunction    IMPRESSION: 1. Very mild basilar predominant subpleural reticulation and ground-glass, similar to minimally progressive from 07/03/2017. Nonspecific interstitial pneumonitis is favored. Findings are indeterminate for UIP per consensus guidelines: Diagnosis of Idiopathic Pulmonary Fibrosis: An Official ATS/ERS/JRS/ALAT Clinical Practice Guideline. Am JINNY Honey Crit Care Med Vol 198, Iss 5, [phone], Feb 25 2017. 2. Aortic atherosclerosis (ICD10-170.0). Coronary artery calcification. 3. Enlarged pulmonic trunk, indicative of pulmonary arterial hypertension. 4.  Emphysema (ICD10-J43.9).     Electronically Signed   By: Newell Eke M.D.   On: 03/20/2019 14:47  Echocardiogram -January 2021: Shows grade 2 diastolic dysfunction but otherwise unremarkable.   ROS - per HPI  HPI [name] 83 y.o. -last visit was in September 2021.  She continues to do well overall.  Shortness of breath symptom question is stable.  Walking desaturation test is stable.  She is compliant with her medications.  In September 2021 there is concern that her ILD was getting worse because of change in PFT.  So we did a CT scan of the chest radiologist reported this to be stable since 2017.  Most  recently she had blood work in December 2021 chemistry panel is normal and I reviewed it.  CBC panel is also normal with stable mild anemia hemoglobin 12.4 g%.  She is up-to-date with her COVID vaccine including booster.  She had pulmonary function test today that showed some fluctuation.  Her main concern is that she is coughing more  In terms of her cough it is dry and has a barking quality to it.  Or laryngeal quality to it.  It is mostly at day and night but never wakes her up at night.  When she has it at night it is only when she wakes up.  Talking makes it worse.  Laughing makes it worse.  Drinking water makes it better.  She thinks it is progressive.  Symptom score showed the cough is getting worse and deep.  She has no fever or chills.  No desaturations.  We discussed the possibility of opportunistic infection but she rightfully questant if it could be opportunistic infection if everything else is stable.  There is no wheezing.  She is wondering if it could be because of allergies.  She has seasonal allergies.  Review of the records indicate this has not been  investigated.  She is willing to have an investigation for this.  We discussed the possibility of cough neuropathy.  She recollects has had gabapentin  before when she had shingles.  She did not have any side effects from it.     CT Chest data sept 2021  TECHNIQUE: Multidetector CT imaging of the chest was performed following the standard protocol without intravenous contrast. High resolution imaging of the lungs, as well as inspiratory and expiratory imaging, was performed.   COMPARISON:  09/09/2019, 03/20/2019, 07/03/2017, 12/06/2016, 06/15/2016   FINDINGS: Cardiovascular: Aortic atherosclerosis. Normal heart size. Three-vessel coronary artery calcifications. No pericardial effusion.   Mediastinum/Nodes: No enlarged mediastinal, hilar, or axillary lymph nodes. Thyroid  gland, trachea, and esophagus demonstrate no significant findings.   Lungs/Pleura: Moderate centrilobular emphysema. No significant interval change in subtle irregular peripheral interstitial opacity predominantly seen in the bilateral lung bases, although also noted in nondependent portions of the right middle lobe and lingula. Mild lobular air trapping on expiratory phase imaging. Stable, definitively benign and faintly rim calcified 6 mm nodule of the dependent left lower lobe (series 5, image 86). No pleural effusion or pneumothorax.   Upper Abdomen: No acute abnormality.   Musculoskeletal: No chest wall mass or suspicious bone lesions identified.   IMPRESSION: 1. No significant interval change in subtle irregular peripheral interstitial opacity predominantly seen in the bilateral lung bases, although also noted in nondependent portions of the right middle lobe and lingula. There is no appreciable change in these findings on multiple prior examinations dating back to 2017. Bland post infectious or inflammatory scarring is favored, although minimal fibrotic interstitial lung disease is not excluded and  if characterized by ATS pulmonary fibrosis criteria, findings are consistent with an early indeterminate for UIP pattern. Consider ongoing follow-up CT to assess for stability of fibrotic findings. Findings are indeterminate for UIP per consensus guidelines: Diagnosis of Idiopathic Pulmonary Fibrosis: An Official ATS/ERS/JRS/ALAT Clinical Practice Guideline. Am JINNY Honey Crit Care Med Vol 198, Iss 5, [phone], Feb 25 2017. 2. Emphysema (ICD10-J43.9). 3. Coronary artery disease.  Aortic Atherosclerosis (ICD10-I70.0).     Electronically Signed   By: Marolyn Jaksch M.D.   On: 03/16/2020 14:12  OV 02/23/2021  Subjective:  Patient ID: [name], female , DOB: 03/14/1941 , age 53  y.o. , MRN: [id number] , ADDRESS: [address] [place] [address] PCP Cleotilde Planas, MD Patient Care Team: Cleotilde Planas, MD as PCP - General (Family Medicine) Fernande Elspeth BROCKS, MD as PCP - Cardiology (Cardiology) Freddie Lynwood HERO, MD as Consulting Physician (Hematology) Fernande Elspeth BROCKS, MD as Consulting Physician (Cardiology) Luis Purchase, MD as Consulting Physician (Gastroenterology)  This Provider for this visit: Treatment Team:  Attending Provider: Geronimo Amel, MD    02/23/2021 -   Chief Complaint  Patient presents with   Follow-up    Pt states she has been doing okay since last visit and denies any complaints.    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  Follow-up associated emphysema  Follow-up Raynaud  Has associated hemochromatosis  Has associated atrial fibrillation on Xarelto   Mild associated diastolic dysfunction and echo January 2021  HPI [name] 83 y.o. -returns for her 45-month follow-up.  Last visit with me was in February 2022.  Is   She continues on Myfortic  prednisone  and Bactrim .  We  held off on nintedanib given stability.  She did have spirometry in February 2022.?  There is a slight decline but she feels stable.  Symptom scores are stable.  Walking desaturation test is below and is stable versus a three-point drop that is?  Significant..  Last CT scan September 2021.  But clinically she feels stable.  We held off on nintedanib last visit because of stability.       OV 07/23/2021  Subjective:  Patient ID: [name], female , DOB: 12/04/1940 , age 31 y.o. , MRN: [id number] , ADDRESS: [address] [place] [address] PCP Cleotilde Planas, MD Patient Care Team: Cleotilde Planas, MD as PCP - General (Family Medicine) Fernande Elspeth BROCKS, MD as PCP - Cardiology (Cardiology) Freddie Lynwood HERO, MD as Consulting Physician (Hematology) Fernande Elspeth BROCKS, MD as Consulting Physician (Cardiology) Luis Purchase, MD as Consulting Physician (Gastroenterology)  This Provider for this visit: Treatment Team:  Attending Provider: Geronimo Amel, MD    07/23/2021 -   Chief Complaint  Patient presents with   Follow-up    Pt states she has been doing okay and denies any complaints.    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  Follow-up associated emphysema  Follow-up Raynaud  Has associated hemochromatosis  Has associated atrial fibrillation on Xarelto   Mild associated diastolic dysfunction and echo January 2021  HPI [name] 83 y.o. -returns for 69-month follow-up.  Last seen in August 2022.  She feels since then dyspnea slightly worse.  This is reflected in the symptom score below.  In December 2022 he did have COVID and she recovered from it.  She does not think the decline is because of the COVID.  She continues on her prednisone  Bactrim  and Myfortic .  Her last CT scan of the chest was in  September 2021.  Last echocardiogram was in January 2021.  Last pulmonary function test was almost 1 year ago in February 2022.  She is wondering about repeat investigations.  She is concerned about worsening ILD.    Of note she just turned 83 years old.  OV 09/09/2021  Subjective:  Patient ID:  [name], female , DOB: 1940-12-30 , age 8 y.o. , MRN: [id number] , ADDRESS: [address] [place] [address] PCP Cleotilde Planas, MD Patient Care Team: Cleotilde Planas, MD as PCP - General (Family Medicine) Fernande Elspeth BROCKS, MD as PCP - Cardiology (Cardiology) Freddie Lynwood HERO, MD as Consulting Physician (Hematology) Fernande Elspeth BROCKS, MD as Consulting Physician (Cardiology) Luis Purchase, MD as Consulting Physician (Gastroenterology)  This Provider for this visit: Treatment Team:  Attending Provider: Geronimo Amel, MD    09/09/2021 -   Chief Complaint  Patient presents with   Follow-up    PFT performed today. Pt states she has been doing okay and denies any complaints.    HPI [name] 83 y.o. -returns for follow-up.  At this point in time she is feeling stable.  She had work-up to see if her ILD is worse or she has developed pulmonary hypertension.  CT scan shows stability since 2021.  Her pulmonary function test shows a 3% decline in DLCO.  On an average she has 3% decline per year over the last 4 years.  At this point in time she is feeling well.  No side effects from Myfortic .  She asked about options if she were to get worse.  We discussed about nintedanib.  Also indicated there is an injection [tocilizumab] but at this point in time she is fine with the choices.  She does understand Myfortic  is immunosuppressant and there is also cancer risk.  She is decided to continue.  She also has mild emphysema for which she is on Spiriva  and it is stable.  I gave her a book called Thursdays with Sherwood written by the patient who has pulmonary fibrosis and scleroderma.  She  is going to read it.  We did discuss briefly clinical trials as a care option and she is interested  -No clearing of the throat.  -She has seen rheumatology recently.  She continues to have [name].  They have a nitroglycerin .  She does not want to try it.  We discussed about using heart warmers.  She is going to buy this   CT Chest data  XR C-ARM NO REPORT  Result Date: 09/07/2021 Please see Notes tab for imaging impression.      OV 04/21/2022  Subjective:  Patient ID: [name], female , DOB: Jun 22, 1941 , age 39 y.o. , MRN: [id number] , ADDRESS: [address] [place] [address] PCP Cleotilde Planas, MD Patient Care Team: Cleotilde Planas, MD as PCP - General (Family Medicine) Fernande Elspeth BROCKS, MD as PCP - Cardiology (Cardiology) Freddie Lynwood HERO, MD as Consulting Physician (Hematology) Fernande Elspeth BROCKS, MD as Consulting Physician (Cardiology) Luis Purchase, MD as Consulting Physician (Gastroenterology)  This Provider for this visit: Treatment Team:  Attending Provider: Geronimo Amel, MD  04/21/2022 -   Chief Complaint  Patient presents with   Follow-up    6 Follow-up PT states no changes, DOE (upstairs, fast paced walking)     HPI [name] 83 y.o. -returns for follow-up.  She tells me that since last seeing me since the summer or so she is slightly more short of breath especially when she climbs stairs but overall symptom score is stable.  In September 2023 she went on vacation to [facility] [place]  and came back with her girlfriends and she had a great time but she did notice more shortness of breath than usual.  She attributed this to heavier exertion but nevertheless with stairs  she is finding herself more short of breath.  She is compliant with Spiriva  for associated emphysema Myfortic  Bactrim  and daily prednisone .  2 days ago she saw Cheryl Birmingham rheumatology PA for her scleroderma and has been deemed stable  Pulmonary function test shows  slight reduction in FVC over time but still within normal limits but the DLCO is dramatically low.  There is no anemia correction on the DLCO.  When I did a rough anemia correction it appears that DLCO might be okay.  Of asked for a formal correction on the DLCO for anemia.  Given her anemia which appears to be relatively more pronounced compared to 2023-05-22greater than 1.5 g% drop she has been referred by rheumatology to hematology because of history of hemochromatosis.  Her last echo and CT scan of the chest were earlier this year  We discussed about the 3 different respiratory vaccines.  Strongly recommended COVID mRNA because she is immunocompromised.  She did have outpatient COVID December 2022 but it was a different strain.  Also recommend high-dose flu shot which she will get today and we discussed RSV vaccine.     PFT  OV 05/26/2022  Subjective:  Patient ID: [name], female , DOB: 08-26-1940 , age 76 y.o. , MRN: [id number] , ADDRESS: [address] [place] [address] PCP Cleotilde Planas, MD Patient Care Team: Cleotilde Planas, MD as PCP - General (Family Medicine) Fernande Elspeth BROCKS, MD as PCP - Cardiology (Cardiology) Fernande Elspeth BROCKS, MD as Consulting Physician (Cardiology) Luis Purchase, MD as Consulting Physician (Gastroenterology) Federico Norleen ONEIDA MADISON, MD as Consulting Physician (Hematology and Oncology)  This Provider for this visit: Treatment Team:  Attending Provider: Geronimo Amel, MD    05/26/2022 -   Chief Complaint  Patient presents with   Acute Visit    Pt started having complaints of dry cough, wheezing, and chest discomfort which began 1 week ago. Has done multiple covid tests which all have come back negative. States she is now coughing up phlegm and also has complaints of a headache and has no energy.   HPI [name] 83 y.o. -she is making an acute visit.  She presents with her daughter Rocky who I have met before.  She was on a  [place] cruise out of [place] between May 14, 2022 and Saturday after Thanksgiving May 20, 2022.  On Friday after Thanksgiving she suddenly took ill.  Symptoms got worse on Saturday when she returned.  She describes laryngitis ear pain cough headaches sore throat runny nose brain fog increased fatigue and sleeping.  COVID test x 2 was negative.  She called our office and we gave her antibiotic and prednisone  because the sputum is now yellow.  She is taking this but she wanted an acute visit today.  Some symptoms are better.  She is saying that her headache is better sore throat is better congestion is better and the tiredness is better but according to the daughter she still sleeping a lot.  Her cough nature has changed and she is got some yellow sputum now.  So some symptoms are not better.  Walking desaturation test is stable but she only walked 2 laps and she stopped because of fatigue.  The cough is particularly worse at night.  She does not want any cough medication  OV 07/08/2022  Subjective:  Patient ID: [name], female , DOB: [birth year] , age 37 y.o. , MRN: [id number] , ADDRESS: [address]  [place] [address] PCP Cleotilde Planas, MD Patient Care Team: Cleotilde Planas, MD as PCP - General (Family Medicine) Fernande Elspeth BROCKS, MD as PCP - Cardiology (Cardiology) Fernande Elspeth BROCKS, MD as Consulting Physician (Cardiology) Luis Purchase, MD as Consulting Physician (Gastroenterology) Federico Norleen ONEIDA MADISON, MD as Consulting Physician (Hematology and Oncology)  This Provider for this visit: Treatment Team:  Attending Provider: Geronimo Amel, MD     07/08/2022 -   Chief Complaint  Patient presents with   Follow-up    Spiro/dlco done today. She states had a hospitalization 06/20/22 with compression fx. Her cough is about the same- occ prod and has to clear throat but does not cough out the sputum. Breathing is at baseline.      HPI [name] 83 y.o.  -returns for follow-up.  I saw her just after Thanksgiving 2023 when she had come back from a cruise and was having respiratory symptoms of acute bronchitis.  She at that time was through her doxycycline  and prednisone  taper she had significant cough.  It appears that after that visit her cough continued unabated.  She was coughing a lot at night.  Then she ended up in December 2023 according to history and review of the external records in the hospital with L2 spinal compression fracture.  She believes the cough caused it.  I did explain to her that I have clinically not seen lumbar spinal compression fracture from cough [I subsequently discussed with Dr. Ozell America about cough expert and he has seen a few patients with lumbar spine compression fracture with severe cough].  In the hospital hemoglobin was 12.3 g%.  And a creatinine was 0.8 mg percent.  She is now back home she is improving.  She is status post kyphoplasty 06/30/2022.  She still has a cough she says she is 80% better compared to when she saw me in November 2023.  She still has some nighttime cough.  She is willing to try some Tessalon  Perles all she is tried so far is Delsym cough syrup.  She continues on her chronic immunosuppressive medications.  At the last visit I ordered an echocardiogram: This is currently pending and is going to be done soon  At the last visit I ordered high-resolution CT chest but in the hospital she did have a CT angiogram: Mild ILD is reported although this is a contrasted CT.  This was done on 06/20/2022.  I personally visualized and independently agree with the findings  She did a pulmonary function test today: This is stable.     OV 11/24/2022  Subjective:  Patient ID: [name], female , DOB: 1940-11-14 , age 30 y.o. , MRN: [id number] , ADDRESS: [address] [place] [address] PCP Cleotilde Planas, MD Patient Care Team: Cleotilde Planas, MD as PCP - General (Family Medicine) Fernande Elspeth BROCKS, MD as PCP - Cardiology (Cardiology) Fernande Elspeth BROCKS, MD as Consulting Physician (Cardiology) Luis Purchase, MD as Consulting Physician (Gastroenterology) Federico Norleen ONEIDA MADISON, MD as Consulting Physician (Hematology and Oncology)  This Provider for this visit: Treatment Team:  Attending Provider: Geronimo Amel, MD  11/24/2022 -   Chief Complaint  Patient presents with   Follow-up    Annual f/up     HPI [name] 83 y.o. -returns for follow-up.  She says her shortness of breath itself is stable.  Exercise size sit/stand desaturation test today stable.  No new medical problems.  No emergency room visits.  However  on memorial day weekend she did go to the emergency room and had an infection of the right small toe.  She is on doxycycline .  Other than that she is well.  But the main issue is that her chronic severe cough persist.  This cough got worse in November 2023/January 2024 after a cruise and viral infection and spinal fracture.  This continues unabated.  Wakes her up at night.  As a tickling of the chest there is no wheezing there is no fever there is a dry cough.  Review of the labs indicate she has never had allergy panel test.  Her eosinophils are slightly elevated.  Her last CT scan of the chest was in December 2023 but was an angiogram.  Showed mild ILD.   feNO could NOT do  OV 12/15/2022  Subjective:  Patient ID: [name], female , DOB: [birth year] , age 51 y.o. , MRN: [id number] , ADDRESS: [address] [place] [address] PCP Cleotilde Planas, MD Patient Care Team: Cleotilde Planas, MD as PCP - General (Family Medicine) Fernande Elspeth BROCKS, MD as PCP - Cardiology (Cardiology) Fernande Elspeth BROCKS, MD as Consulting Physician (Cardiology) Luis Purchase, MD as Consulting Physician (Gastroenterology) Federico Norleen ONEIDA MADISON, MD as Consulting Physician (Hematology and Oncology)  This Provider for this visit: Treatment Team:  Attending Provider: Geronimo Amel,  MD   12/15/2022 -   Chief Complaint  Patient presents with   Follow-up    F/up on PFT and blood work results   Type of visit: Video Virtual Visit Identification of patient ASUCENA GALER with Aug 24, 1940 and MRN [id number] - 2 person identifier Risks: Risks, benefits, limitations of telephone visit explained. Patient understood and verbalized agreement to proceed Anyone else on call: just patien Patient location: her home This provider location: [address]; [place]; [place] [address]. [facility]. [phone]    HPI [name] 83 y.o. -returns for follow-up on this video visit.  This video visit is to discuss some of the evaluation we did for chronic cough.  I noticed that her cat dander is slightly positive but she tells me that she has no cats at all and no exposure to cats recently.  Otherwise allergy panel is negative.  She did have pulmonary function test yesterday and shows continued stability.  Overall in terms of her symptoms she tells me she stable she is continuing her medications.  She says the cough is slightly better.  The frequency is much less but when it comes to severe it is the same but overall she feels improved and stable.  She feels it was the allergy season that made her cough more.  We discussed the potential role of bronchoscopy with lavage rule out opportunistic infections which I said was low probability of finding alternative etiologies for cough.  We also discussed the option of waiting and watching.  She prefer the latter.  Will see her back in 6 months with a spirometry and DLCO.     OV 07/06/2023  Subjective:  Patient ID: [name], female , DOB: 07-22-1940 , age 71 y.o. , MRN: [id number] , ADDRESS: [address] [place] [address] PCP Cleotilde Planas, MD Patient Care Team: Cleotilde Planas, MD as PCP - General (Family Medicine) Fernande Elspeth BROCKS, MD as PCP - Cardiology (Cardiology) Fernande Elspeth BROCKS, MD as  Consulting Physician (Cardiology) Luis Purchase, MD as Consulting Physician (Gastroenterology) Federico Norleen ONEIDA MADISON, MD as Consulting Physician (  Hematology and Oncology)  This Provider for this visit: Treatment Team:  Attending Provider: Geronimo Amel, MD   07/06/2023 -   Chief Complaint  Patient presents with   Follow-up    Pft f/u, denies any concerns states she is the same.      HPI FEIGE LOWDERMILK 83 y.o. -returns for a 67-month follow-up.  She says she has been doing well from a respiratory standpoint.  She continues on Myfortic  without any side effects other than very occasional diarrhea.  She continues low-dose prednisone  5 mg and also Bactrim  prophylaxis.  For associated emphysema she continue Spiriva .  She says she safety labs not otherwise specified at primary care office last month.  She is reluctant to get them tested today.  Most recent labs in October 2024 liver function test and chemistries were normal within our system.  Therefore I have accepted that.  She states on June 25, 2023 she fell on a staircase and sustained a 7-10 cm longitudinal laceration on the distal third of the left shin.  It is nonhealing at this point.  There was a lot of oozing of blood.  Since yesterday she is at the wound center.  Did explain to her along with aging and chronic prednisone  contributing to poor wound healing.  Asked if she is willing to taper the prednisone  slowly.  She is willing to give this a try.  She had pulmonary function test today and her lung function stable for the last 2 years.  Last CT scan was 2 years ago and she is willing to have another 1 at follow-up.     OV 11/16/2023  Subjective:  Patient ID: [name], female , DOB: 08-13-1940 , age 34 y.o. , MRN: [id number] , ADDRESS: [address] [place] [address] PCP Cleotilde Planas, MD Patient Care Team: Cleotilde Planas, MD as PCP - General (Family Medicine) Fernande Elspeth BROCKS, MD as PCP - Cardiology  (Cardiology) Fernande Elspeth BROCKS, MD as Consulting Physician (Cardiology) Luis Purchase, MD as Consulting Physician (Gastroenterology) Federico Norleen ONEIDA MADISON, MD as Consulting Physician (Hematology and Oncology)  This Provider for this visit: Treatment Team:  Attending Provider: Geronimo Amel, MD    11/16/2023 -   Chief Complaint  Patient presents with   Follow-up    Breathing is doing well and she denies any new co's.      HPI ERNESTYNE CALDWELL 83 y.o. -returns for follow-up.  She reports overall stability in pulmonary health.  Last visit because of prolonged prednisone  intake for many years and ongoing pulmonary stability and because of shin ulcer I recommend coming off prednisone .  By March April 2025 she had significant prednisone  withdrawal and that she started having cough and fatigue and feeling poorly.  She went back on prednisone  and since then she is feeling well.  The cough is just minimal.  Shortness of breath is stable.  She CT scan of the chest that I personally visualized show due to her ILD stable now for couple of years.  She is pleased by this.  In April 2025 she did have blood labs that can be considered as part of Myfortic  monitoring and these are stable.  Since her last visit she has seen oncology who is monitoring her anemia.  She is also seeing GI because of her diarrhea and they are going to do procedures on her.  She and I discussed the fact diarrhea could be because of Myfortic   She also had a CT head  as follow-up with her neurologist.   CT Chest data from date: apiril 2025  - personally visualized and independently interpreted : yes - my findings are: as below Narrative & Impression  CLINICAL DATA:  Diffuse/interstitial lung disease.   EXAM: CT CHEST WITHOUT CONTRAST   TECHNIQUE: Multidetector CT imaging of the chest was performed following the standard protocol without intravenous contrast. High resolution imaging of the lungs, as well as inspiratory and  expiratory imaging, was performed.   RADIATION DOSE REDUCTION: This exam was performed according to the departmental dose-optimization program which includes automated exposure control, adjustment of the mA and/or kV according to patient size and/or use of iterative reconstruction technique.   COMPARISON:  06/20/2022 and 08/24/2021.   FINDINGS: Cardiovascular: Atherosclerotic calcification of the aorta, aortic valve and coronary arteries. Enlarged pulmonic trunk and heart. No pericardial effusion.   Mediastinum/Nodes: No pathologically enlarged mediastinal or axillary lymph nodes. Hilar regions are difficult to definitively evaluate without IV contrast. Esophagus is grossly unremarkable.   Lungs/Pleura: Centrilobular and paraseptal emphysema. Mild basilar predominant subpleural reticular densities and ground-glass, similar to 06/20/2022. There may be minimal associated bronchiolectasis. No honeycombing. Posterior left lower lobe nodule measures 5 mm (4 x 6 mm, 7/81), unchanged from 06/20/2022. Per Fleischner Society guidelines, no follow-up is necessary. No new pulmonary nodules. No pleural fluid. Airway is unremarkable. Minimal air trapping.   Upper Abdomen: Small hiatal hernia. Visualized portions of the liver, gallbladder, adrenal glands, kidneys, spleen, pancreas, stomach and bowel are otherwise grossly unremarkable. No upper abdominal adenopathy.   Musculoskeletal: Upper lumbar vertebral body augmentations. Osteopenia. Degenerative changes in the spine. Age indeterminate mild T10 compression fracture.   IMPRESSION: 1. Mild basilar predominant subpleural reticular densities and ground-glass with questionable bronchiolectasis, similar to 06/20/2022. Findings are categorized as probable UIP per consensus guidelines: Diagnosis of Idiopathic Pulmonary Fibrosis: An Official ATS/ERS/JRS/ALAT Clinical Practice Guideline. Am JINNY Honey Crit Care Med Vol 198, Iss 5, [phone], Feb 25 2017. Findings are indeterminate for UIP per consensus guidelines: Diagnosis of Idiopathic Pulmonary Fibrosis: An Official ATS/ERS/JRS/ALAT Clinical Practice Guideline. Am JINNY Honey Crit Care Med Vol 198, Iss 5, [phone], Feb 25 2017. 2. Minimal air trapping is indicative of small airways disease. 3. Age indeterminate mild T10 compression fracture. 4. Aortic atherosclerosis (ICD10-I70.0). Coronary artery calcification. 5. Enlarged pulmonic trunk, indicative of pulmonary arterial hypertension. 6.  Emphysema (ICD10-J43.9).     Electronically Signed   By: Newell Eke M.D.   On: 10/18/2023 15:57     OV 05/14/2024  Subjective:  Patient ID: [name], female , DOB: 11/16/1940 , age 44 y.o. , MRN: [id number] , ADDRESS: [address] [place] [address] PCP Cleotilde Planas, MD Patient Care Team: Cleotilde Planas, MD as PCP - General (Family Medicine) Almetta Donnice LABOR, MD as PCP - Electrophysiology (Cardiology) Luis Purchase, MD as Consulting Physician (Gastroenterology) Federico Norleen ONEIDA MADISON, MD as Consulting Physician (Hematology and Oncology)  This Provider for this visit: Treatment Team:  Attending Provider: Geronimo Amel, MD    05/14/2024 -   Chief Complaint  Patient presents with   Interstitial Lung Disease    Pt states since LOV breathing has gotten worse SOB w/ any activity,  Dry cough Pt states face has been puffy for a while now, thinks its fluid  Also pt stopped taking Cathey stated it was not helping      HPI [name] 83 y.o. -OLIVINE HIERS is an 83 year old female who presents with a persistent dry  cough and shortness of breath. She is accompanied by her daughte ERIN (a reatlor ) She has been experiencing a persistent dry cough for the past few months, describing it as 'hard' and 'deep.' The cough has progressively worsened over time. She recalls a similar episode a few years ago following an RSV infection, which also resulted in  a prolonged cough.  Significant fatigue has been present over the past few months, which she attributes to the persistent cough. The cough disrupts her sleep, waking her at night. Her family has noted wheezing, although she does not hear it herself due to wearing hearing aids.  In addition to the cough, she has experienced shortness of breath for the past three weeks. This occurs with minimal exertion, such as walking from the sofa to the kitchen while carrying dishes, and has progressively worsened, impacting her ability to perform daily activities.  Over the past week, she has noticed facial puffiness and swelling in her hands and feet. Her daughter observed the facial swelling during a recent trip. She has gained approximately seven pounds in the last few weeks, which she attributes to fluid retention, as evidenced by her shoes becoming tight.  No fever, chills, or vomiting, but she experiences some nausea.   Her current medications include Vifortic, prednisone , Bactrim , and Eliquis . She completed a course of prednisone  and azithromycin prescribed by her primary care physician a few weeks ago, which did not alleviate her symptoms. She drinks a lot of water and does not use oxygen at night.  CXR 2d ago visyualized - small new pleft pleural effusion  Daughter is indy historian  OV 06/06/2024  Subjective:  Patient ID: [name], female , DOB: 24-Jan-1941 , age 89 y.o. , MRN: [id number] , ADDRESS: [address] [place] [address] PCP Cleotilde Planas, MD Patient Care Team: Cleotilde Planas, MD as PCP - General (Family Medicine) Almetta Donnice LABOR, MD as PCP - Electrophysiology (Cardiology) Luis Purchase, MD as Consulting Physician (Gastroenterology) Federico Norleen ONEIDA MADISON, MD as Consulting Physician (Hematology and Oncology)  This Provider for this visit: Treatment Team:  Attending Provider: Geronimo Amel, MD    06/06/2024 -   Chief Complaint  Patient presents with    Interstitial Lung Disease    Pt states since LOV breathing has been better SOB occurs w/ exertion Dry cough     Follow-up mildy sllowy progressive - stable interstitial lung disease with 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  Has associated hemochromatosis  Has associated atrial fibrillation on Xarelto   Mild associated diastolic dysfunction in pst but not on echo  Feb 2023  LBP with L2 compression frcture status post kyphoplasty 1//24.- dec 2023 after viral inection during cruise nov 2023  HPI [name] 83 y.o. -[name] is an 83 year old female withd scleroderma and Mild ILD on ccellcept.    She was seen acutely a few weeks ago.  At the time she had signs of pulmonary congestion with volume overload.  Following a visit the BNP was greater than 700.  She is known to have grade 2 diastolic dysfunction.  Started on Lasix .  With the Lasix  she tells me nowShe has experienced significant weight loss, losing 14 pounds within 36  hours after taking a lower dose of Lasix , followed by an additional 4 pounds over the next few days. This weight loss is attributed to fluid reduction, as there was a marked improvement in shortness of breath and puffiness after taking Lasix . She is currently using Lasix  on an as-needed basis.  She is now seen electrophysiologist Dr. Almetta and subsequent to this . She underwent cardioversion on December 3rd and is participating in a research study related to cardioversion.  All this is improved her shortness of breath but she is not fully back to baseline yet.  Because worsening ILD was in the differential diagnosis we also got a CT scan of the chest.  The ILD is very mild and it  is stable.  The radiologist felt there is evidence of pulmonary congestion still going on.  I personally visualized the CT chest and showed it to her.  I did indicate to her that the ILD is mild and she has been on Myfortic  form of mycophenolate  for many years and wondered if we could stop it.  She is kind of nervous about stopping it.  She feels it has helped put her disease in remission.  She recently saw Cheryl Birmingham the rheumatologist PA.  I reviewed her notes.  I have no reach out to her to see if patient can come off Myfortic     SYMPTOM SCALE - ILD 07/06/2023 11/16/2023  05/14/2024 Acute -> BNP > 700 and srarted lasix  06/06/2024 Now lasix  prn . Lost weight  Current weight      O2 use ra ra ra   Shortness of Breath 0 -> 5 scale with 5 being worst (score 6 If unable to do)     At rest 0 1 4 1   Simple tasks - showers, clothes change, eating, shaving 1 1.5 4 2.5  Household (dishes, doing bed, laundry) 1 1.5 4 2.5  Shopping 1 1 4  2.5  Walking level at own pace 1 1.5 4 2.5  Walking up Stairs 2 2 24 3   Total (30-36) Dyspnea Score 6 8.5 20 14   How bad is your cough? 3 2 5 4   How bad is your fatigue 2 2 4 3   appetire   3 0  How bad is nausea 1 0.5 1.5 0  How bad is vomiting?  0 0 0 2.5  How bad is diarrhea? 2 2,5 2.5 0  How bad is anxiety? 0 0 0 0  How bad is depression 0 0 0 0  Any chronic pain - if so where and how bad 1 0 3 fluid retenion           Simple office walk 224 (66+46 x 2) feet Pod A at [facility] x  3 laps goal with forehead probe 05/14/2024  06/06/2024   O2 used ra   Number laps completed 2 of 3   Comments about pace Norma pace   Resting Pulse Ox/HR 95% and 113/min   Final Pulse Ox/HR 94% and 115/min   Desaturated </= 88% no   Desaturated <= 3% points no   Got Tachycardic >/= 90/min yes   Symptoms at end of test dyspneic   Miscellaneous comments X- STOPPED ERARLY         SIT STAND TEST - goal 15 times   06/06/2024    O2 used ra   PRobe - finter or  forehead finager   Number sit and stand completed - goal 15 15   Time taken to complete 53  sec   Resting Pulse Ox/HR/Dyspnea  97% and 59/min and dyspnea of 1/10    Peak measures 98 % and 70/min and dyspnea of 2/10   Final Pulse Ox/HR 96% and 62/min and dyspnea of 2.5/10   Desaturated </= 88% no   Desaturated <= 3% points no   Got Tachycardic >/= 90/min no   Miscellaneous comments better       CT Chest data from date: November 2025  - personally visualized and independently interpreted : yes - my findings are: as below Narrative & Impression  CLINICAL DATA:  Diffuse/interstitial lung disease.   EXAM: CT CHEST WITHOUT CONTRAST   TECHNIQUE: Multidetector CT imaging of the chest was performed following the standard protocol without intravenous contrast. High resolution imaging of the lungs, as well as inspiratory and expiratory imaging, was performed.   RADIATION DOSE REDUCTION: This exam was performed according to the departmental dose-optimization program which includes automated exposure control, adjustment of the mA and/or kV according to patient size and/or use of iterative reconstruction technique.   COMPARISON:  11/20/2023 and 06/20/2022.   FINDINGS: Cardiovascular: Atherosclerotic calcification of the aorta, aortic valve and coronary arteries. Enlarged pulmonic trunk and heart. No pericardial effusion.   Mediastinum/Nodes: No pathologically enlarged mediastinal or axillary lymph nodes. Hilar regions are difficult to definitively evaluate without IV contrast. Esophagus is grossly unremarkable.   Lungs/Pleura: Centrilobular and paraseptal emphysema. Minimal peribronchovascular ground-glass in the right middle lobe, likely infectious/inflammatory in etiology. Mild basilar predominant subpleural reticular densities and ground-glass. Suspect superimposed septal thickening in the lung bases with tiny bilateral pleural effusions. 4 mm posterior left lower lobe nodule  (5/78), unchanged from 06/20/2022 and benign. Airway is unremarkable. No air trapping.   Upper Abdomen: Visualized portions of the liver, gallbladder, adrenal glands, kidneys, spleen, pancreas, stomach and bowel are grossly unremarkable. No upper abdominal adenopathy.   Musculoskeletal: Degenerative changes in the spine. Vertebral body augmentations. Osteopenia. T10 sclerosis may be due to mild compression, unchanged. Old sternal fracture.   IMPRESSION: 1. Suspect mild basilar interstitial lung abnormality or interstitial lung disease. Findings are categorized as probable UIP per consensus guidelines: Diagnosis of Idiopathic Pulmonary Fibrosis: An Official ATS/ERS/JRS/ALAT Clinical Practice Guideline. Am JINNY Honey Crit Care Med Vol 198, Iss 5, [phone], Feb 25 2017. 2. Favor mild congestive heart failure. 3. Aortic atherosclerosis (ICD10-I70.0). Coronary artery calcification. 4. Enlarged pulmonic trunk, indicative of pulmonary arterial hypertension. 5.  Emphysema (ICD10-J43.9).     Electronically Signed   By: Newell Eke M.D.   On: 05/20/2024 11:53    PFT     Latest Ref Rng & Units 07/06/2023   11:26 AM 12/14/2022   10:27 AM 07/08/2022    2:57 PM 04/12/2022   10:27 AM 09/09/2021   10:46 AM 08/18/2020    2:49 PM 02/28/2020    4:16 PM  PFT Results  FVC-Pre L 2.57  2.57  2.52  2.48  2.67  2.78  2.90   FVC-Predicted Pre % 102  102  99  96  103  105  108   Pre FEV1/FVC % % 71  70  73  72  68  68  66   FEV1-Pre L 1.83  1.80  1.84  1.80  1.81  1.88  1.92   FEV1-Predicted Pre % 98  96  97  93  94  96  96   DLCO uncorrected ml/min/mmHg 11.05  11.48  11.51  9.26  11.33  11.74  11.88   DLCO UNC% % 60  62  61  49  60  62  63   DLCO corrected ml/min/mmHg 11.05  11.78  11.93  9.26  12.46  11.74  11.88   DLCO COR %Predicted % 60  63  64  49  66  62  63   DLVA Predicted % 66  71  73  57  70  65  66        LAB RESULTS last 96 hours No results found.       has a past medical  history of Anxiety, Atrial fibrillation (HCC), Chest pain, GERD (gastroesophageal reflux disease), Hemochromatosis, History of syncope, Hypothyroidism, and Osteoporosis.   reports that she quit smoking about 26 years ago. Her smoking use included cigarettes. She started smoking about 68 years ago. She has a 42 pack-year smoking history. She has never been exposed to tobacco smoke. She has never used smokeless tobacco.  Past Surgical History:  Procedure Laterality Date   CARDIOVERSION N/A 05/29/2024   Procedure: CARDIOVERSION;  Surgeon: Sheena Pugh, DO;  Location: [facility];  Service: Cardiovascular;  Laterality: N/A;   COLONOSCOPY     IR KYPHO LUMBAR INC FX REDUCE BONE BX UNI/BIL CANNULATION INC/IMAGING  06/30/2022   IR KYPHO LUMBAR INC FX REDUCE BONE BX UNI/BIL CANNULATION INC/IMAGING  08/16/2022   MOUTH SURGERY     SHOULDER ARTHROSCOPY Left    TOTAL ABDOMINAL HYSTERECTOMY     UPPER GASTROINTESTINAL ENDOSCOPY     VARICOSE VEIN SURGERY      No Known Allergies  Immunization History  Administered Date(s) Administered    sv, Bivalent, Protein Subunit Rsvpref,pf Marlow) 06/14/2023   Fluad Quad(high Dose 65+) 02/18/2019, 04/21/2022   Fluzone Influenza virus vaccine,trivalent (IIV3), split virus 03/27/2019   INFLUENZA, HIGH DOSE SEASONAL PF 08/29/2017, 04/26/2018, 04/29/2020, 05/12/2021   PFIZER(Purple Top)SARS-COV-2 Vaccination 07/22/2019, 08/12/2019, 03/11/2020   PNEUMOCOCCAL CONJUGATE-20 05/31/2023   Pfizer(Comirnaty)Fall Seasonal Vaccine 12 years and older 04/18/2024   Pneumococcal Conjugate-13 06/12/2014   Pneumococcal Polysaccharide-23 06/27/2010, 04/25/2018   Tdap 03/16/2011, 07/05/2019   Zoster Recombinant(Shingrix) 04/08/2019   Zoster, Live 06/27/2010, 04/08/2019    Family History  Problem Relation Age of Onset   Stroke Mother    Heart disease Father 55   Hemochromatosis Brother    Breast cancer Neg Hx    Colon cancer Neg Hx    Esophageal cancer Neg Hx     Rectal cancer Neg Hx    Stomach cancer Neg Hx      Current Outpatient Medications:    acetaminophen  (TYLENOL ) 500 MG tablet, Take 500-1,000 mg by mouth every 6 (six) hours as needed for mild pain (pain score 1-3) or headache., Disp: , Rfl:    apixaban  (ELIQUIS ) 5 MG TABS tablet, Take 1 tablet (5 mg total) by mouth 2 (two) times daily., Disp: 180 tablet, Rfl: 3   atorvastatin  (LIPITOR) 20 MG tablet, Take 20 mg by mouth daily., Disp: , Rfl:    Calcium  Carbonate Antacid (TUMS PO), Take 1 tablet by mouth daily as needed (heartburn)., Disp: , Rfl:    cetirizine (ZYRTEC) 10 MG tablet, Take 10 mg by mouth daily. (Patient taking differently: Take 10 mg by mouth as needed for allergies or rhinitis.), Disp: , Rfl:    cyanocobalamin  1000 MCG tablet, Take 1,000 mcg by mouth 3 (three) times a week., Disp: , Rfl:    Dextromethorphan HBr (DELSYM PO), Take 10 mLs by mouth daily as needed (cough)., Disp: , Rfl:    Dihydroxyaluminum Sod Carb (ROLAIDS PO), Take  1 tablet by mouth daily as needed (heartburn)., Disp: , Rfl:    flecainide  (TAMBOCOR ) 100 MG tablet, TAKE 1 TABLET(100 MG) BY MOUTH TWICE DAILY, Disp: 180 tablet, Rfl: 3   folic acid  (FOLVITE ) 1 MG tablet, Take 1 mg by mouth daily., Disp: , Rfl:    furosemide  (LASIX ) 20 MG tablet, TAKE 1 TABLET(20 MG) BY MOUTH DAILY, Disp: 90 tablet, Rfl: 0   levothyroxine  (SYNTHROID ) 100 MCG tablet, 1 tablet in the morning on an empty stomach Orally Once a day, Disp: , Rfl:    methocarbamol  (ROBAXIN ) 500 MG tablet, Take 1 tablet (500 mg total) by mouth daily as needed for muscle spasms., Disp: 30 tablet, Rfl: 0   metoprolol  succinate (TOPROL -XL) 25 MG 24 hr tablet, Take 25 mg by mouth daily., Disp: , Rfl:    mycophenolate  (MYFORTIC ) 360 MG TBEC EC tablet, Take 1 tablet (360 mg total) by mouth 2 (two) times daily., Disp: 180 tablet, Rfl: 3   ondansetron  (ZOFRAN ) 4 MG tablet, Take 1 tablet (4 mg total) by mouth every 8 (eight) hours as needed for nausea or vomiting., Disp:  90 tablet, Rfl: 3   pantoprazole  (PROTONIX ) 20 MG tablet, Take 20 mg by mouth 2 (two) times daily., Disp: , Rfl:    Polyethyl Glycol-Propyl Glycol (SYSTANE) 0.4-0.3 % SOLN, Place 1 drop into both eyes 2 (two) times daily., Disp: , Rfl:    potassium chloride  (KLOR-CON  M) 10 MEQ tablet, Take 1 tablet (10 mEq total) by mouth daily., Disp: 30 tablet, Rfl: 0   predniSONE  (DELTASONE ) 5 MG tablet, TAKE 1 TABLET(5 MG) BY MOUTH DAILY WITH BREAKFAST, Disp: 90 tablet, Rfl: 1   sulfamethoxazole -trimethoprim  (BACTRIM  DS) 800-160 MG tablet, TAKE 1 TABLET BY MOUTH ON MONDAY, WEDNESDAY AND FRIDAY AS DIRECTED, Disp: 90 tablet, Rfl: 3   Tiotropium Bromide  Monohydrate (SPIRIVA  RESPIMAT) 2.5 MCG/ACT AERS, Inhale 2 puffs into the lungs daily., Disp: 4 g, Rfl: 8   Vitamin D , Cholecalciferol , 25 MCG (1000 UT) TABS, Take 1 tablet by mouth daily., Disp: , Rfl:    Multiple Vitamins-Minerals (PRESERVISION AREDS 2) CAPS, Take 1 capsule by mouth in the morning and at bedtime. (Patient not taking: Reported on 06/06/2024), Disp: , Rfl:       Objective:   Vitals:   06/06/24 1026  BP: 118/60  Pulse: (!) 56  Temp: 97.8 F (36.6 C)  TempSrc: Oral  SpO2: 96%  Weight: 129 lb 12.8 oz (58.9 kg)  Height: 5' 3.5 (1.613 m)    Estimated body mass index is 22.63 kg/m as calculated from the following:   Height as of this encounter: 5' 3.5 (1.613 m).   Weight as of this encounter: 129 lb 12.8 oz (58.9 kg).  @WEIGHTCHANGE @  [employer]   06/06/24 1026  Weight: 129 lb 12.8 oz (58.9 kg)     Physical Exam   General: No distress. Looks better O2 at rest: no Cane present: nio Sitting in wheel chair: o Frail: no Obese: non Neuro: Alert and Oriented x 3. GCS 15. Speech normal Psych: Pleasant Resp:  Barrel Chest - no.  Wheeze - no, Crackles - mild R Base, No overt respiratory distress CVS: Normal heart sounds. Murmurs - no Ext: Stigmata of Connective Tissue Disease - no but has mild sclderoema HEENT: Normal upper  airway. PEERL +. No post nasal drip        Assessment/     Assessment & Plan ILD (interstitial lung disease) (HCC)  Scl-70 antibody positive  [name] phenomenon without gangrene  High risk medication use  Encounter for therapeutic drug monitoring  Elevated brain natriuretic peptide (BNP) level  Grade II diastolic dysfunction    PLAN Patient Instructions  Cough Dyspnea Edema Fatigue  - subacue symptoms in Nov 2025 -> BNP 700 and much improved but nto fully as of 06/06/2024 - concern is worsening diastolic chf or new onset systolic CHF esp in setting of recent ATrial Fibrillation - ILD itslf seems table on HRCT  Plan  - do blood work for bnp 06/06/2024 as followup  - [facility] echo results   Interstitial pulmonary disease (HCC) Scl-70 antibody positive [name] phenomenon without gangrene High risk medication use Therapeutic drug monitoring    - CLinically stable ILD x  2-3 years . CT  April 2025 and Dec 2025 with ild stble ILD and emphsye  - Could not tolertate coming off prednisone   Plan -Continue prednisone  5mg  daily -Continue Myfortic  as before but checking with Cheryl [place] if we can stop this - Continue Bactrim  as before  - keep pft appotimet in Jan 2026   Pulmonary emphysema, unspecified emphysema type [facility])  Plan -  Continue spiriva    Followup Jan 2026 with Dr Geronimo as already scheduled    FOLLOWUP    Return for Jan 2026 with Dr Geronimo as already scheduled.    SIGNATURE    Dr. Dorethia Geronimo, M.D., F.C.C.P,  Pulmonary and Critical Care Medicine Staff Physician, [facility] Center Director - Interstitial Lung Disease  Program  Pulmonary Fibrosis [facility] Network at [facility] [place], [place], [address]  Pager: [phone], If no answer or between  15:00h - 7:00h: call [phone] Telephone: [phone]  4:23 PM 06/06/2024

## 2024-06-06 ENCOUNTER — Ambulatory Visit: Admitting: Internal Medicine

## 2024-06-06 ENCOUNTER — Encounter: Payer: Self-pay | Admitting: Internal Medicine

## 2024-06-06 ENCOUNTER — Telehealth: Payer: Self-pay | Admitting: Internal Medicine

## 2024-06-06 VITALS — BP 118/60 | HR 56 | Temp 97.8°F | Ht 63.5 in | Wt 129.8 lb

## 2024-06-06 DIAGNOSIS — I7300 Raynaud's syndrome without gangrene: Secondary | ICD-10-CM

## 2024-06-06 DIAGNOSIS — R7989 Other specified abnormal findings of blood chemistry: Secondary | ICD-10-CM

## 2024-06-06 DIAGNOSIS — I5189 Other ill-defined heart diseases: Secondary | ICD-10-CM | POA: Diagnosis not present

## 2024-06-06 DIAGNOSIS — J849 Interstitial pulmonary disease, unspecified: Secondary | ICD-10-CM

## 2024-06-06 DIAGNOSIS — R7689 Other specified abnormal immunological findings in serum: Secondary | ICD-10-CM | POA: Diagnosis not present

## 2024-06-06 DIAGNOSIS — J439 Emphysema, unspecified: Secondary | ICD-10-CM | POA: Diagnosis not present

## 2024-06-06 DIAGNOSIS — Z5181 Encounter for therapeutic drug level monitoring: Secondary | ICD-10-CM

## 2024-06-06 DIAGNOSIS — Z79899 Other long term (current) drug therapy: Secondary | ICD-10-CM

## 2024-06-06 LAB — BRAIN NATRIURETIC PEPTIDE: Pro B Natriuretic peptide (BNP): 377 pg/mL — ABNORMAL HIGH (ref 0.0–100.0)

## 2024-06-06 NOTE — Telephone Encounter (Signed)
 Hi Dr. Veleta the patient having frequent infections or what is the concern for continuing mycophenolate ?  She could always try reducing to once daily instead of twice daily if needed. From a rheumatologic standpoint for scleroderma-we usually continue mycophenolate  with close monitoring unless the patient is having side effects, frequent infections, or pancytopenia.   Let me know what you think,  -[place]

## 2024-06-06 NOTE — Telephone Encounter (Signed)
 [name] if from your perspective ok to stop [name] cellcept ? Or is there a lower dose for myfortic ?  THanks    SIGNATURE    Dr. Dorethia Cave, M.D., F.C.C.P,  Pulmonary and Critical Care Medicine Staff Physician, [facility] Center Director - Interstitial Lung Disease  Program  Pulmonary Fibrosis [facility] Network at [facility] [place], [place], [address]   Pager: [phone], If no answer  -> Check AMION or Try [phone] Telephone (clinical office): [phone] Telephone (research): [phone]  11:22 AM 06/06/2024

## 2024-06-07 NOTE — Telephone Encounter (Signed)
 We're ok with her reducing to once daily dosing if she is ok with that.

## 2024-06-07 NOTE — Telephone Encounter (Signed)
 Her ILD Is minimal and stable for long time. Long term cellcept  increases cancer risk and immune suppression risk. She feels it is controlling things and keeping her well but I am wondring about reducing long term risk.   Is there a balance? Lower dose and monitor?

## 2024-06-14 ENCOUNTER — Other Ambulatory Visit (INDEPENDENT_AMBULATORY_CARE_PROVIDER_SITE_OTHER)

## 2024-06-14 DIAGNOSIS — R053 Chronic cough: Secondary | ICD-10-CM | POA: Diagnosis not present

## 2024-06-14 DIAGNOSIS — R5383 Other fatigue: Secondary | ICD-10-CM

## 2024-06-14 DIAGNOSIS — R0609 Other forms of dyspnea: Secondary | ICD-10-CM

## 2024-06-14 DIAGNOSIS — J849 Interstitial pulmonary disease, unspecified: Secondary | ICD-10-CM

## 2024-06-14 DIAGNOSIS — R609 Edema, unspecified: Secondary | ICD-10-CM | POA: Diagnosis not present

## 2024-06-14 LAB — ECHOCARDIOGRAM COMPLETE
AR max vel: 1.71 cm2
AV Area VTI: 1.82 cm2
AV Area mean vel: 1.77 cm2
AV Mean grad: 4 mmHg
AV Peak grad: 7.4 mmHg
AV Vena cont: 0.36 cm
Ao pk vel: 1.36 m/s
Area-P 1/2: 4.12 cm2
MV VTI: 1.74 cm2
S' Lateral: 2.65 cm

## 2024-06-19 NOTE — Progress Notes (Signed)
 Echo with mild increase in Pulmonary ARtery pressure. Wil discuss at followup

## 2024-06-19 NOTE — Telephone Encounter (Signed)
 Thanks and I l talk to her Jan 2026 visit

## 2024-06-24 ENCOUNTER — Encounter: Payer: Self-pay | Admitting: Student in an Organized Health Care Education/Training Program

## 2024-06-24 ENCOUNTER — Ambulatory Visit
Attending: Student in an Organized Health Care Education/Training Program | Admitting: Student in an Organized Health Care Education/Training Program

## 2024-06-24 VITALS — BP 130/64 | HR 58 | Ht 63.0 in | Wt 129.4 lb

## 2024-06-24 DIAGNOSIS — Z79899 Other long term (current) drug therapy: Secondary | ICD-10-CM

## 2024-06-24 DIAGNOSIS — R55 Syncope and collapse: Secondary | ICD-10-CM

## 2024-06-24 DIAGNOSIS — I480 Paroxysmal atrial fibrillation: Secondary | ICD-10-CM | POA: Diagnosis not present

## 2024-06-24 DIAGNOSIS — Z5181 Encounter for therapeutic drug level monitoring: Secondary | ICD-10-CM | POA: Diagnosis not present

## 2024-06-24 NOTE — Progress Notes (Unsigned)
" °  Cardiology Office Note   Date:  06/24/2024  ID:  FERRAH PANAGOPOULOS, DOB 01-25-1941, MRN [id number] PCP: Cleotilde Planas, MD  [facility] Providers Cardiologist:  None Electrophysiologist:  Donnice DELENA Primus, MD { Click to update primary MD,subspecialty MD or APP then REFRESH:1}    History of Present Illness [name] is a 83 y.o. female ***  ROS: ***  Studies Reviewed      *** Risk Assessment/Calculations {Does this patient have ATRIAL FIBRILLATION?:[id number]}         Physical Exam VS:  BP 130/64 (BP Location: Right Arm, Patient Position: Sitting, Cuff Size: Normal)   Pulse (!) 58   Ht 5' 3 (1.6 m)   Wt 129 lb 6.4 oz (58.7 kg)   SpO2 97%   BMI 22.92 kg/m        Wt Readings from Last 3 Encounters:  06/24/24 129 lb 6.4 oz (58.7 kg)  06/06/24 129 lb 12.8 oz (58.9 kg)  05/29/24 126 lb 6.4 oz (57.3 kg)    GEN: Well nourished, well developed in no acute distress NECK: No JVD; No carotid bruits CARDIAC: ***RRR, no murmurs, rubs, gallops RESPIRATORY:  Clear to auscultation without rales, wheezing or rhonchi  ABDOMEN: Soft, non-tender, non-distended EXTREMITIES:  No edema; No deformity   ASSESSMENT AND PLAN ***    {Are you ordering a CV Procedure (e.g. stress test, cath, DCCV, TEE, etc)?   Press F2        :[id number]}  Dispo: ***  Signed, Donnice DELENA Primus, MD  "

## 2024-06-24 NOTE — Patient Instructions (Signed)
 Medication Instructions:  Your physician recommends that you continue on your current medications as directed. Please refer to the Current Medication list given to you today.  *If you need a refill on your cardiac medications before your next appointment, please call your pharmacy*  Lab Work: None ordered.  If you have labs (blood work) drawn today and your tests are completely normal, you will receive your results only by: MyChart Message (if you have MyChart) OR A paper copy in the mail If you have any lab test that is abnormal or we need to change your treatment, we will call you to review the results.  Testing/Procedures: None ordered.   Follow-Up: At [facility], you and your health needs are our priority.  As part of our continuing mission to provide you with exceptional heart care, our providers are all part of one team.  This team includes your primary Cardiologist (physician) and Advanced Practice Providers or APPs (Physician Assistants and Nurse Practitioners) who all work together to provide you with the care you need, when you need it.  Your next appointment:   6 months with Dr Almetta

## 2024-06-24 NOTE — Progress Notes (Unsigned)
" °  Cardiology Office Note   Date:  06/24/2024  ID:  [name], DOB 02-Mar-1941, MRN [id number] PCP: Cleotilde Planas, MD  [facility] Providers Cardiologist:  None Electrophysiologist:  Donnice DELENA Primus, MD { Click to update primary MD,subspecialty MD or APP then REFRESH:1}    History of Present Illness [name] is a 83 y.o. female ***  ROS: ***  Studies Reviewed      *** Risk Assessment/Calculations {Does this patient have ATRIAL FIBRILLATION?[phone]}         Physical Exam VS:  BP 130/64 (BP Location: Right Arm, Patient Position: Sitting, Cuff Size: Normal)   Pulse (!) 58   Ht 5' 3 (1.6 m)   Wt 129 lb 6.4 oz (58.7 kg)   SpO2 97%   BMI 22.92 kg/m        Wt Readings from Last 3 Encounters:  06/24/24 129 lb 6.4 oz (58.7 kg)  06/06/24 129 lb 12.8 oz (58.9 kg)  05/29/24 126 lb 6.4 oz (57.3 kg)    GEN: Well nourished, well developed in no acute distress NECK: No JVD; No carotid bruits CARDIAC: ***RRR, no murmurs, rubs, gallops RESPIRATORY:  Clear to auscultation without rales, wheezing or rhonchi  ABDOMEN: Soft, non-tender, non-distended EXTREMITIES:  No edema; No deformity   ASSESSMENT AND PLAN ***    {Are you ordering a CV Procedure (e.g. stress test, cath, DCCV, TEE, etc)?   Press F2        :[id number]}  Dispo: ***  Signed, Donnice DELENA Primus, MD  "

## 2024-07-05 ENCOUNTER — Encounter: Payer: Self-pay | Admitting: Internal Medicine

## 2024-07-05 ENCOUNTER — Telehealth: Payer: Self-pay | Admitting: Internal Medicine

## 2024-07-05 ENCOUNTER — Ambulatory Visit

## 2024-07-05 ENCOUNTER — Ambulatory Visit: Admitting: Internal Medicine

## 2024-07-05 VITALS — BP 126/76 | HR 56 | Ht 63.0 in | Wt 131.0 lb

## 2024-07-05 DIAGNOSIS — Z5181 Encounter for therapeutic drug level monitoring: Secondary | ICD-10-CM

## 2024-07-05 DIAGNOSIS — J439 Emphysema, unspecified: Secondary | ICD-10-CM | POA: Diagnosis not present

## 2024-07-05 DIAGNOSIS — J849 Interstitial pulmonary disease, unspecified: Secondary | ICD-10-CM

## 2024-07-05 DIAGNOSIS — K521 Toxic gastroenteritis and colitis: Secondary | ICD-10-CM

## 2024-07-05 DIAGNOSIS — Z87891 Personal history of nicotine dependence: Secondary | ICD-10-CM | POA: Diagnosis not present

## 2024-07-05 DIAGNOSIS — Z7952 Long term (current) use of systemic steroids: Secondary | ICD-10-CM

## 2024-07-05 DIAGNOSIS — R931 Abnormal findings on diagnostic imaging of heart and coronary circulation: Secondary | ICD-10-CM

## 2024-07-05 DIAGNOSIS — M349 Systemic sclerosis, unspecified: Secondary | ICD-10-CM

## 2024-07-05 DIAGNOSIS — Z79899 Other long term (current) drug therapy: Secondary | ICD-10-CM

## 2024-07-05 DIAGNOSIS — J432 Centrilobular emphysema: Secondary | ICD-10-CM

## 2024-07-05 DIAGNOSIS — R7689 Other specified abnormal immunological findings in serum: Secondary | ICD-10-CM

## 2024-07-05 DIAGNOSIS — I50810 Right heart failure, unspecified: Secondary | ICD-10-CM

## 2024-07-05 DIAGNOSIS — M359 Systemic involvement of connective tissue, unspecified: Secondary | ICD-10-CM

## 2024-07-05 LAB — PULMONARY FUNCTION TEST
DL/VA % pred: 65 %
DL/VA: 2.67 ml/min/mmHg/L
DLCO cor % pred: 52 %
DLCO cor: 9.64 ml/min/mmHg
DLCO unc % pred: 50 %
DLCO unc: 9.23 ml/min/mmHg
FEF 25-75 Pre: 0.96 L/s
FEF2575-%Pred-Pre: 75 %
FEV1-%Pred-Pre: 86 %
FEV1-Pre: 1.59 L
FEV1FVC-%Pred-Pre: 94 %
FEV6-%Pred-Pre: 97 %
FEV6-Pre: 2.25 L
FEV6FVC-%Pred-Pre: 104 %
FVC-%Pred-Pre: 93 %
FVC-Pre: 2.29 L
Pre FEV1/FVC ratio: 69 %
Pre FEV6/FVC Ratio: 98 %

## 2024-07-05 NOTE — Patient Instructions (Signed)
Spiro/DLCO performed today. 

## 2024-07-05 NOTE — Progress Notes (Signed)
 "   PCP [name],[name], PA-C   HPI  IOV 02/10/2016  Chief Complaint  Patient presents with   Pulmonary Consult    Pt referred by Dr. Sophronia for chronic cough x 1 year. Pt states she feels she has a tickle that is causing her dry cough. Pt states she has DOE when climbing stairs. Pt deneis CP/tightness.     84 year old female with hemochromatosis homozygous gene followed by Dr. Freddie (last phlebotomy many years ago and is on serial monitoring) with children and siblings with active disease. She has also atrial fibrillation followed by Dr. Fernande. Reports insidious onset of chronic cough in the last 1 year. It is stable since onset. It fluctuates between mild and severe in severity. It is mostly dry in quality. Mostly present in the daytime but sometimes also wakes her up at night. It is definitely not progressive. It is episodic and present every day. Aggravated by talking sometimes and then the throat feels dry associated with tickle in the throat and also sensation the cough is coming from the upper chest retrosternally and sometimes relieved by drinking water or chewing on a lozenge. Also aggravated by seasonal changes particularly in the spring and the fall which makes her think she has allergies. There is sometimes associated gag. She also reports nonspecific occasional wheezing and associated shortness of breath that is nonspecific but present with exertion and relieved by rest No other clear cut aggravating or relieving factors.   cough associated history  - Medications: She is not on fish oil or ACE inhibitors - Sinus drainage: She does admit to spring allergies. She did have something removed from her hard palate several years ago by ENT does not know details - Acid reflux: She says she is significant acid reflux and is on Prilosec. Without which she'll have significant symptoms - Pulmonary disease: FenO 12 02/10/2016  and normal. She denies any personal history of asthma or pulmonary  fibrosis of COPD or emphysema smoked one pack per day started smoking at age 53 and quit in 1999. Making a 42 pack smoking history. Chest x-ray 08/26/2014 personally visualized is clear - Tobacco :  reports that she quit smoking about 18 years ago. She has a 42.00 pack-year smoking history. She has never used smokeless tobacco.       OV 02/18/2016  Chief Complaint  Patient presents with   Follow-up    Pt here after PFT and HRCT. Pt denies changes in SOB and cough. Pt denies any new complaints at this time.    Follow-up chronic cough. This visit is to follow-up on test results of function test and high resolution CT chest which are described below9 results show isolated reduction in diffusion capacity which can be explained by possible early ILD and also emphysema. There are also new findings of nodule 6 mm bilaterally on lower lobes. She prefers a very cautious approach to this evaluation.     Pulmonary function test 02/12/2016 - FVC 2.8 cm/101%, FEV1 1.9 L/91%. Ratio 60/90%. Total lung capacity 115%. DLCO reduced at 14.5/60%. She is isolated reduction in diffusion capacity    HRCT chest 02/16/16 IMPRESSION: 1. Mild patchy subpleural reticulation in both lungs with a basilar predominance. No significant traction bronchiectasis. No frank honeycombing. Findings could represent an interstitial lung disease such as nonspecific interstitial pneumonia (NSIP), with early usual interstitial pneumonia (UIP) not excluded. A follow-up high-resolution chest CT study in 12 months is recommended to assess temporal pattern stability. 2. Bilateral lower lobe solid  pulmonary nodules, largest 6 mm. Non-contrast chest CT at 3-6 months is recommended. If the nodules are stable at time of repeat CT, then future CT at 18-24 months (from today's scan) is considered optional for low-risk patients, but is recommended for high-risk patients. This recommendation follows the consensus statement: Guidelines  for Management of Incidental Pulmonary Nodules Detected on CT Images:From the Fleischner Society 2017; published online before print (10.1148/radiol.[id number]). 3. Additional findings include mild-to-moderate centrilobular emphysema, aortic atherosclerosis and 2 vessel coronary atherosclerosis.     Electronically Signed   By: Selinda DELENA Blue M.D.   On: 02/16/2016 14:14    OV 08/31/2016  Chief Complaint  Patient presents with   Follow-up    HRCT was never scheduled. Pt states that the cough is still present >> slightly improved since last OV. Pt states that she feels her allergies has gotten it ramped up again.    Follow-up multifactorial cough associated with autoimmune antibody positive, interstitial lung disease and emphysema previous history of smoking  After last visit in August 2017 she had high resolution CT chest in December 2017 that showed persistence of ILD. I personally visualized the CT chest. He comes for follow-up. She tells me that since August 2017 she has insidious onset of shortness of breath and is progressively worse. It is mild and present only on inclines is not therefore tenderness. In terms of her cough is persistent. It is associated with significant sinus drainage that she thinks is allergy related. She constantly clears her throat. Lab review shows autoimmune antibody positive in August 2017 with scleroderma antibody slightly elevated at 6.4. I referred her to rheumatology but she does not remember this and she did not make this follow-up. She also tells me that she definitely has a long history of [name] phenomena in her fingers but no one has ever formally diagnosed with connective tissu  e disease.  OV 11/28/2016  Chief Complaint  Patient presents with   Follow-up    Pt here after PFT. Pt states her breathing is uchanged since last OV. Pt c/o dry cough.- pt states this has slightly improved since last OV. Pt denies CP/tightness and f/c/s.     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 Since then she has seen rheumatologis Dr. JONETTA. I reviewd the notes and also discussed with the patient wa a good understading of what s going on. She tells me that some of the lupus antibodies have been positive correlating with Raynaud. Patient is on beta blocker for atrial fbrillation and it is the preference by the rheumatologist Dr. D that she switc to calcium  channel blocker. She has a follow-up appointment pending with her electrophysiologist Dr. Fernande.  She is scleroderma antibody positive but according to her rheumtolgist she does not have dermatologic  Manifestation of the disese. 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 She pulm  function test today and this is stable. In terms of arrest or symptoms she stable. The cough is actually improved. But she does get dyspneic walking up stairs especially a few flights. She does not wnt her to pulmonary habilitation because se exrcises on the treadmill although she does not monitor her saturations.   reports that she quit smoking about 19 years ago. She has a 42.00 pack-year smoking history. she has never used smokeless tobacco.   OV 06/12/2017  Chief Complaint  Patient presents with   Follow-up    Pt states that she has been doing good since last visit. States that she has a tickle cough that is sporadic and has SOB that she states is when she climbs 2 flights of stairs. Denies any CP.    Follow-up combined emphysema was interstitial lung disease [autoimmune undifferentiated connective tissue disease interstitial lung disease]. Autoimmune features from May 2018 rheumatology noted with Dr. JONETTA: Raynaud phenomenon positive without gangrene, positive anti-cardiolipin and positive IgM and SCL-70 positive without clinical features of  scleroderma    Last seen June 2018. Since then she's stable. Overall she tells me that only problem is a tickle in her throat and slight cough. This is up and down depending on the pollen exposure she gets. She gets dyspneic for climbing few to several flights of stairs. This is unchanged. She did have full function test today and that shows mild significant worsening though overall gradient is only mild.. However she's not feeling this. She has appointment with Dr. JONETTA  pending    OV 07/18/2017  Chief Complaint  Patient presents with   Follow-up    HRCT done 07/03/17.  Pt states she is the same as she was at last visit. SOB with exertion.    Follow-up combined emphysema was interstitial lung disease [autoimmune undifferentiated connective tissue disease interstitial lung disease]. Autoimmune features from May 2018 rheumatology noted with Dr. JONETTA: Raynaud phenomenon positive without gangrene, positive anti-cardiolipin and positive IgM and SCL-70 positive without clinical features of scleroderma    [name] presents for follow-up.  She is here to discuss the results of a high-resolution CT scan of the chest.  The scan was reviewed by Dr. Jeremy thoracic radiology who feels that patient has probable UIP pattern that is definitely progressive compared to August 2017 although there is no comment about progression since June 2018 CT scan.  The pulmonary nodules itself are stable since August 2017 and are likely benign.  The change in the CT scan which is mild progression pulmonary fibrosis corresponds with the pulmonary function test that shows mild progression.  She is now here with her daughter Rocky and her husband.  She also states that 1 of her other daughters has rheumatoid arthritis and is on TNF alpha blockade.  All her 3 daughters have hemo-chromatosis    OV 08/29/2017  Chief Complaint  Patient presents with   Follow-up    Pt states she has been doing good since last visit. Pt  recently went to [place]  and had some mild problems with SOB due to heat.  Pt was prescribed cellcept  and bactrim  but has not yet started meds due to questions with other meds she is taking.    84 year old female with ILD secondary to autoimmune disease clinically suspicious of scleroderma but also previous history of hemochromatosis with family history of hemochromatosis.  She is here with her husband.  This visit is only a discussion visit.  Because she has many questions about starting CellCept  before she actually does.  We did hepatitis virus panel, QuantiFERON gold, G6PD and all this is normal.  Lab work is normal.  I checked with [facility] pharmacist about interactions and was cleared for her to start CellCept .  The only recommendation was for patient to come off Prilosec because of reduced effect of CellCept .  Patient questions revolved around CellCept : She does not want to come off PPI because of hiatal hernia and has had bad acid  reflux.  So I would not Isaiah Medley asking for any alternative PPI.  In addition she states she takes multiple multivitamins including Biotene, vitamin D3, vitamin D , vitamin B12 and folic acid .  She also takes Allegra occasionally.  She wants to make sure it is all okay with CellCept .  She has not had a flu shot today and she is asking if she should have it.  She has never had flu shot before.  She is also wondering about anticoagulation in the setting of CellCept  and thyroid  issues in the setting of Bactrim .  In terms of her genetics: Initially I corresponded with our local geneticist who thought she might be better served at [facility] but the [facility] genetics person wrote to me saying that patient should first be seen by [facility] rheumatology and hematology.  Patient's not so sure she wants to see the subspecialist.  She will speak to Dr. Freddie her local hematologist to see if it is worthwhile seeing the hematology department at [facility].   Lungs/Pleura: Moderate centrilobular emphysema. Mild basilar predominant subpleural reticulation and ground-glass, increased from 02/16/2016. No traction bronchiectasis/ bronchiolectasis, architectural distortion or honeycombing. Side-by-side nodules in the medial left lower lobe measure up to 6 mm (series 3, image 95), unchanged from 02/16/2016 and considered benign. No air trapping. No pleural fluid. Airway is unremarkable.  IMPRESSION: 1. Mild progression in basilar predominant subpleural fibrosis from 02/16/2016, raising suspicion for usual interstitial pneumonitis. 2. Aortic atherosclerosis (ICD10-170.0). Moderate coronary artery calcification. 3.  Emphysema (ICD10-J43.9).     Electronically Signed   By: Newell Eke M.D.   On: 07/03/2017 09:54    OV 09/19/2017  Chief Complaint  Patient presents with   Follow-up    Pt states she has been doing good. Currently on cellcept  and bactrim  and has been doing well on it once put back on omeprazole. Pt still has the cough and sometimes when trying to take a deep breath can't fully get all O2 out.   [name] returns for follow-up.  This is for autoimmune interstitial lung disease.  She was started on CellCept  recently.  She is here to follow-up for therapeutic drug monitoring.  Because she was on omeprazole and we were initiating CellCept  with Bactrim  we asked her to hold off on omeprazole because the omeprazole impairs drug levels of CellCept .  However she tried it for a week and her acid reflux was significant despite ranitidine so she went back on omeprazole.  Pharmacist Isaiah Medley is advising changing the CellCept  to equivaekbtdose of Myfortic . So far tolerating cellcept /pred/bactrimm well. Has seen dR G in heme and on observation. Had PFT today on cellcept /pred/bactrim  and is better! Continue siwht spriva  OV 10/31/2017  . Chief Complaint  Patient presents with   Follow-up    Pt currently taking  myfortic  and states she has been well on that. Pt states she has some mild nausea and has some problems with sleeping at night. SOB is stable,mild coughing. Denies any CP.    Follow-up progressive interstitial lung disease with SCL-70 antibody positive and Raynard Follow-up associated emphysema.  Last visit September 19, 2017.  At that time she had intolerance to the CellCept  therefore we switched her to Myfortic .  She is here to report for follow-up about this.  She is tolerating Myfortic  just well.  She had some transient insomnia for a few days last week but this resolved.  She only has mild intermittent occasional nausea with the Myfortic  but otherwise is  tolerating it really well at the low-dose of 360 mg twice daily.  She continues on prednisone  10 mg daily and Bactrim  3 times a week.  She is due for blood work today.  There are no other new issues.  She continues on Spiriva  for her emphysema   OV 02/01/2018  Chief Complaint  Patient presents with   Follow-up    PFT performed today.  Pt stated other than having shingles earlier, things have been doing good for her. Pt states SOB is stable and states she still has an occ cough.    Follow-up progressive interstitial lung disease with SCL-70 antibody positive and Raynud - IPAF Follow-up associated emphysema   KILYNN FITZSIMMONS presents for routine follow-up with interstitial lung disease clinic. She has the above problems. In the interim she tells me she had developed shingles on the right neck but this is resolved. This is despite having zoster vaccine in 2012. She was yet to have the bnnew  shingles vaccine.in any event currently she is feeling well. She only has occasional post residual neuropathic pain. In terms of her shortness of breath it is stable and minimal. Walking desaturation test shows stability. Pulmicort function test below show stability. She is tolerating the mycophenolate  myfortic  well at 360 mg twice daily and prednisone  10 mg  daily and Bactrim  for PCP prophylaxis. Overall she feels stable and feels that it is best medicines on touch in terms of dosing change  OV 08/14/2018  Subjective:  Patient ID: [name], female , DOB: 18-Apr-1941 , age 58 y.o. , MRN: [id number] , ADDRESS: [address] [place] [address]   08/14/2018 -   Chief Complaint  Patient presents with   Follow-up    PFT performed today.  Pt states she is slowly getting better after her recent pna. States she still has some issues with SOB and an occ cough. Pt also states that she feels like she has a weight on her chest at times.    Follow-up progressive interstitial lung disease with SCL-70 antibody positive and Raynud - IPAF - on myfortic  since march 2019 with pred 10 and bactrim   Follow-up associated emphysema - on spiriva     HPIfortic KARAN INCLAN 84 y.o. -returns for 46-month follow-up of her interstitial lung disease associated with emphysema.  She presents with her middle daughter Rocky who is met me before.  She continues on Myfortic  and prednisone  10 mg and Bactrim  prophylaxis.  She continues on Spiriva .  She had an episode of atrial fibrillation according to review of the chart and my on history with in the middle of January 2020.  At this time she had a chest x-ray that showed asymptomatic upper lobe pneumonia.  I personally visualized the chest x-ray and confirmed the findings.  Antibiotic was not prescribed.  Then I follow this up with a CT scan of the chest [we canceled her elective colonoscopy].  CT chest showed improved pneumonia findings.  I prescribed even though she was feeling well cephalexin  for a week.  She says she took it.  She continues to feel well except she has baseline amount of symptoms that is documented below.  Of note the high-resolution CT chest showed an improvement in ILD compared to a year ago.  In fact her pulmonary function tests are also showing stability/improvement as documented below.  She is very  happy about this outcome with immunosuppression .  However, she is a little bit unsure why she is symptomatic in  terms of shortness of breath and cough.  She is very concerned that her fibrosis is getting worse.  She wants to have closer follow-up than every 6 months.   IMPRESSION: HRCT Jan 2020 1. Vaguely bandlike patchy consolidation and ground-glass opacity at the periphery of the right upper and superior segment right lower lobes, new since 07/03/2017 chest CT, improving compared to 07/10/2018 chest radiograph, favoring resolving pneumonia with evolving postinfectious scarring. 2. Previously described basilar predominant subpleural reticulation and ground-glass attenuation is minimal and appears decreased in the interval. No bronchiectasis or honeycombing. Findings may represent an interstitial lung disease such as nonspecific interstitial pneumonia (NSIP) or less likely early usual interstitial pneumonia (UIP). Findings are indeterminate for UIP per consensus guidelines: Diagnosis of Idiopathic Pulmonary Fibrosis: An Official ATS/ERS/JRS/ALAT Clinical Practice Guideline. Am JINNY Honey Crit Care Med Vol 198, Iss 5, [phone], Feb 25 2017. 3. Moderate centrilobular emphysema with mild diffuse bronchial wall thickening, suggesting COPD. 4. Two-vessel coronary atherosclerosis. 5. Small hiatal hernia.   Aortic Atherosclerosis (ICD10-I70.0) and Emphysema (ICD10-J43.9). ROS - per HPI    OV 04/22/2019  Subjective:  Patient ID: [name], female , DOB: 1940/09/25 , age 19 y.o. , MRN: [id number] , ADDRESS: [address] [place] [address]   Follow-up slowly progressive interstitial lung disease with SCL-70 antibody positive and Raynud - IPAF - on myfortic  since march 2019 with pred 5 and bactrim  3x/week  Follow-up associated emphysema - on spiriva    04/22/2019 -   Chief Complaint  Patient presents with   Follow-up    Patient reports that her breathing is doing well  at this time.      HPI ZOEANN MOL 84 y.o. -presents for follow-up.  Last seen by myself in early part of 2020 before the pandemic.  At that time CT chest suggested possible progression.  She saw a nurse practitioner in the interim after the onset of the pandemic.  She was anxious about the progression of her ILD so she asked for a CT scan in less than 1 year.  A CT scan of the chest was done in September 2020.  This is deemed stable since early part of the year but with possible progression versus stability since January 2019.  In terms of her symptoms she continues to be stable with very minimal shortness of breath and cough.  Her pulmonary function test below also shows stability.  Her walking desaturation test also shows stability.  She is tolerating her Myfortic , prednisone  and 3 times a week of Bactrim  just fine.  There are no side effects.  Last blood work was in August 2020 and this was reviewed.  We discussed the possibility of switching to antifibrotic nintedanib instead of taking immunosuppressants.  The rational for this would be it is a safer drug with less long-term side effect such as lack of immunosuppression.  However she feels her current regimen is working well for her.  She also feels that she would benefit more from a immunosuppressive drug because of her autoimmune antibodies and therefore she is more inclined to continue with her current immunosuppressive regimen of prednisone  5 mg/day and Myfortic  and Bactrim  3 times a week.        OV 07/04/2019  Subjective:  Patient ID: [name], female , DOB: 08/14/1940 , age 59 y.o. , MRN: [id number] , ADDRESS: [address] [place] [address]   Follow-up mildy progressive - stable interstitial lung disease with SCL-70 antibody positive and Raynud -  IPAF - on myfortic  since march 2019 with pred 5 and bactrim  3x/week  Follow-up associated emphysema  Follow-up Raynard  Has associated hemochromatosis  Has  associated atrial fibrillation on Xarelto    07/04/2019 -   Chief Complaint  Patient presents with   Follow-up    Pt states she has been doing well since last visit and denies any complaints.     HPI [name] 84 y.o. -last visit was in October 2020.  At that time she was doing well.  Given her continued immunosuppression with prednisone  and Myfortic  we discussed the possibility of switching the Myfortic  to the nintedanib but understanding that there uncertainties whether this is superior approach and if she could end up with temporary side effects of the nintedanib.  Based on that she decided to continue with prednisone , Myfortic  and Bactrim  as she was doing because things are working well.  However she later called in and wanted this change to nintedanib.  We did this change to nintedanib but then her co-pay ended up being over 500+ dollars and then she started also getting worried about the side effects with nintedanib particularly GI.  She is also on Xarelto  for atrial fibrillation.  Therefore she made this visit ahead of schedule to discuss the possibility of making the switch.  I understand from her that it will be 500+ dollars every month with nintedanib as a co-pay.  In addition she is worried about the superiority of taking nintedanib.  She fully understands that Myfortic  over length of time can carry cancer risk and immunosuppression risk but at this point in time she feels well and she is tolerating it well.  She also understands that nintedanib can carry temporary GI side effects.  She also understands that it may not be a superior approach.  She understands it could be an equivalent approach and definitely lower immunosuppression.  Her main systemic immunosuppressive features are lung disease and Raynaud.  She is worried this might flareup.  In terms of her symptoms there is stable as documented below.  Her walking desaturation test is also stable.   Last pulmonary function test  was in August r 2020 Last CT scan of the chest was in September 2020 Last blood work October 2020 Last echocardiogram 2012.  ROS - per HPI  OV 10/22/2019  Subjective:  Patient ID: [name], female , DOB: May 05, 1941 , age 47 y.o. , MRN: [id number] , ADDRESS: [address] [place] [address]   Follow-up mildy progressive - stable interstitial lung disease with SCL-70 antibody positive and Raynud - IPAF - on myfortic  since march 2019 with pred 5 and bactrim  3x/week  Follow-up associated emphysema  Follow-up Raynaud  Has associated hemochromatosis  Has associated atrial fibrillation on Xarelto     10/22/2019 -   Chief Complaint  Patient presents with   Follow-up    PFT performed today. Pt states she has been doing good since last visit. Denies any complaints with breathing.     HPI TRITIA ENDO 84 y.o. -returns for routine follow-up.  Overall doing well.  She feels shortness of breath is stable.  She feels she is tolerating Myfortic  and prednisone  fine no new interim issues she has had a Covid vaccine.  She continues on high risk immunosuppressive therapy.  There is some mild diarrhea with the Myfortic  but it is tolerable.  The shortness of breath scale is stable walking desaturation test is stable.  Her pulmonary function test shows stable FVC with  a possible reduction in diffusion capacity.  I do not know what to make of this at this point in time.  It could be a technical issue because she is feeling stable.  She did have echocardiogram that showed grade 2 diastolic dysfunction.   She continues on her Spiriva  for associated emphysema.  ROS - per HPI     OV 03/03/2020   Subjective:  Patient ID: [name], female , DOB: 02-14-1941, age 25 y.o. years. 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 -returns for follow-up.  Overall she is doing well.  However in the last 1-2 months she says she is slightly more short of breath than usual.  She is also having slight increase in dry cough.  Based on her subjective symptom questionnaires it seems the cough itself is stable but she is definitely saying it is worse.  She is seeing the dyspnea is overall stable compared to the cough but looking at the symptom questionnaire is maybe it is a little worse.  Is hard to discern.  It has been approximately 1 year since she had a high-resolution CT chest.  She is wondering if she can have this repeated.  She is on immunosuppression.  She is also noticing -that at home her pulse oximetry on her finger varies a lot [she does have [name]]) and sometimes can be as low as 88% - 93%.  She does acknowledge that the circulation in her hands is not as good.  Pulse ox here at rest was normal.  She had pulmonary function test that shows stability.-With the FVC although I am not sure what to make of the DLCO.  She has had a Covid booster vaccine  Last echocardiogram January 2021 with mild diastolic dysfunction    IMPRESSION: 1. Very mild basilar predominant subpleural reticulation and ground-glass, similar to minimally progressive from 07/03/2017. Nonspecific interstitial pneumonitis is favored. Findings are indeterminate for UIP per consensus guidelines: Diagnosis of Idiopathic Pulmonary Fibrosis: An Official ATS/ERS/JRS/ALAT Clinical Practice Guideline. Am JINNY Honey Crit Care Med Vol 198, Iss 5, [phone], Feb 25 2017. 2. Aortic atherosclerosis (ICD10-170.0). Coronary artery calcification. 3. Enlarged pulmonic trunk, indicative of pulmonary arterial hypertension. 4.  Emphysema (ICD10-J43.9).     Electronically Signed   By: Newell Eke M.D.   On: 03/20/2019 14:47  Echocardiogram -January 2021: Shows grade 2 diastolic dysfunction but otherwise unremarkable.   ROS - per HPI  HPI [name] 84 y.o. -last visit was in September 2021.  She continues to do well overall.  Shortness of breath symptom question is stable.  Walking desaturation test is stable.  She is compliant with her medications.  In September 2021 there is concern that her ILD was getting worse because of change in PFT.  So we did a CT scan of the chest radiologist reported this to be stable since 2017.  Most recently she  had blood work in December 2021 chemistry panel is normal and I reviewed it.  CBC panel is also normal with stable mild anemia hemoglobin 12.4 g%.  She is up-to-date with her COVID vaccine including booster.  She had pulmonary function test today that showed some fluctuation.  Her main concern is that she is coughing more  In terms of her cough it is dry and has a barking quality to it.  Or laryngeal quality to it.  It is mostly at day and night but never wakes her up at night.  When she has it at night it is only when she wakes up.  Talking makes it worse.  Laughing makes it worse.  Drinking water makes it better.  She thinks it is progressive.  Symptom score showed the cough is getting worse and deep.  She has no fever or chills.  No desaturations.  We discussed the possibility of opportunistic infection but she rightfully questant if it could be opportunistic infection if everything else is stable.  There is no wheezing.  She is wondering if it could be because of allergies.  She has seasonal allergies.  Review of the records indicate this has not been  investigated.  She is willing to have an investigation for this.  We discussed the possibility of cough neuropathy.  She recollects has had gabapentin  before when she had shingles.  She did not have any side effects from it.     CT Chest data sept 2021  TECHNIQUE: Multidetector CT imaging of the chest was performed following the standard protocol without intravenous contrast. High resolution imaging of the lungs, as well as inspiratory and expiratory imaging, was performed.   COMPARISON:  09/09/2019, 03/20/2019, 07/03/2017, 12/06/2016, 06/15/2016   FINDINGS: Cardiovascular: Aortic atherosclerosis. Normal heart size. Three-vessel coronary artery calcifications. No pericardial effusion.   Mediastinum/Nodes: No enlarged mediastinal, hilar, or axillary lymph nodes. Thyroid  gland, trachea, and esophagus demonstrate no significant findings.   Lungs/Pleura: Moderate centrilobular emphysema. No significant interval change in subtle irregular peripheral interstitial opacity predominantly seen in the bilateral lung bases, although also noted in nondependent portions of the right middle lobe and lingula. Mild lobular air trapping on expiratory phase imaging. Stable, definitively benign and faintly rim calcified 6 mm nodule of the dependent left lower lobe (series 5, image 86). No pleural effusion or pneumothorax.   Upper Abdomen: No acute abnormality.   Musculoskeletal: No chest wall mass or suspicious bone lesions identified.   IMPRESSION: 1. No significant interval change in subtle irregular peripheral interstitial opacity predominantly seen in the bilateral lung bases, although also noted in nondependent portions of the right middle lobe and lingula. There is no appreciable change in these findings on multiple prior examinations dating back to 2017. Bland post infectious or inflammatory scarring is favored, although minimal fibrotic interstitial lung disease is not excluded and  if characterized by ATS pulmonary fibrosis criteria, findings are consistent with an early indeterminate for UIP pattern. Consider ongoing follow-up CT to assess for stability of fibrotic findings. Findings are indeterminate for UIP per consensus guidelines: Diagnosis of Idiopathic Pulmonary Fibrosis: An Official ATS/ERS/JRS/ALAT Clinical Practice Guideline. Am JINNY Honey Crit Care Med Vol 198, Iss 5, [phone], Feb 25 2017. 2. Emphysema (ICD10-J43.9). 3. Coronary artery disease.  Aortic Atherosclerosis (ICD10-I70.0).     Electronically Signed   By: Marolyn Jaksch M.D.   On: 03/16/2020 14:12  OV 02/23/2021  Subjective:  Patient ID: [name], female , DOB: 05-30-1941 , age 55 y.o. ,  MRN: [id number] , ADDRESS: [address] [place] [address] PCP Cleotilde Planas, MD Patient Care Team: Cleotilde Planas, MD as PCP - General (Family Medicine) Fernande Elspeth BROCKS, MD as PCP - Cardiology (Cardiology) Freddie Lynwood HERO, MD as Consulting Physician (Hematology) Fernande Elspeth BROCKS, MD as Consulting Physician (Cardiology) Luis Purchase, MD as Consulting Physician (Gastroenterology)  This Provider for this visit: Treatment Team:  Attending Provider: Geronimo Amel, MD    02/23/2021 -   Chief Complaint  Patient presents with   Follow-up    Pt states she has been doing okay since last visit and denies any complaints.    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  Follow-up associated emphysema  Follow-up Raynaud  Has associated hemochromatosis  Has associated atrial fibrillation on Xarelto   Mild associated diastolic dysfunction and echo January 2021  HPI [name] 84 y.o. -returns for her 73-month follow-up.  Last visit with me was in February 2022.  Is   She continues on Myfortic  prednisone  and Bactrim .  We  held off on nintedanib given stability.  She did have spirometry in February 2022.?  There is a slight decline but she feels stable.  Symptom scores are stable.  Walking desaturation test is below and is stable versus a three-point drop that is?  Significant..  Last CT scan September 2021.  But clinically she feels stable.  We held off on nintedanib last visit because of stability.       OV 07/23/2021  Subjective:  Patient ID: [name], female , DOB: Apr 28, 1941 , age 80 y.o. , MRN: [id number] , ADDRESS: [address] [place] [address] PCP Cleotilde Planas, MD Patient Care Team: Cleotilde Planas, MD as PCP - General (Family Medicine) Fernande Elspeth BROCKS, MD as PCP - Cardiology (Cardiology) Freddie Lynwood HERO, MD as Consulting Physician (Hematology) Fernande Elspeth BROCKS, MD as Consulting Physician (Cardiology) Luis Purchase, MD as Consulting Physician (Gastroenterology)  This Provider for this visit: Treatment Team:  Attending Provider: Geronimo Amel, MD    07/23/2021 -   Chief Complaint  Patient presents with   Follow-up    Pt states she has been doing okay and denies any complaints.    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  Follow-up associated emphysema  Follow-up Raynaud  Has associated hemochromatosis  Has associated atrial fibrillation on Xarelto   Mild associated diastolic dysfunction and echo January 2021  HPI [name] 84 y.o. -returns for 49-month follow-up.  Last seen in August 2022.  She feels since then dyspnea slightly worse.  This is reflected in the symptom score below.  In December 2022 he did have COVID and she recovered from it.  She does not think the decline is because of the COVID.  She continues on her prednisone  Bactrim  and Myfortic .  Her last CT scan of the chest was in  September 2021.  Last echocardiogram was in January 2021.  Last pulmonary function test was almost 1 year ago in February 2022.  She is wondering about repeat investigations.  She is concerned about worsening ILD.    Of note she just turned 84 years old.  OV 09/09/2021  Subjective:  Patient ID: Tara JINNY  Welford, female , DOB: [birth year] , age 18 y.o. , MRN: [id number] , ADDRESS: [address] [place] [address] PCP Cleotilde Planas, MD Patient Care Team: Cleotilde Planas, MD as PCP - General (Family Medicine) Fernande Elspeth BROCKS, MD as PCP - Cardiology (Cardiology) Freddie Lynwood HERO, MD as Consulting Physician (Hematology) Fernande Elspeth BROCKS, MD as Consulting Physician (Cardiology) Luis Purchase, MD as Consulting Physician (Gastroenterology)  This Provider for this visit: Treatment Team:  Attending Provider: Geronimo Amel, MD    09/09/2021 -   Chief Complaint  Patient presents with   Follow-up    PFT performed today. Pt states she has been doing okay and denies any complaints.    HPI [name] 84 y.o. -returns for follow-up.  At this point in time she is feeling stable.  She had work-up to see if her ILD is worse or she has developed pulmonary hypertension.  CT scan shows stability since 2021.  Her pulmonary function test shows a 3% decline in DLCO.  On an average she has 3% decline per year over the last 4 years.  At this point in time she is feeling well.  No side effects from Myfortic .  She asked about options if she were to get worse.  We discussed about nintedanib.  Also indicated there is an injection [tocilizumab] but at this point in time she is fine with the choices.  She does understand Myfortic  is immunosuppressant and there is also cancer risk.  She is decided to continue.  She also has mild emphysema for which she is on Spiriva  and it is stable.  I gave her a book called Thursdays with Sherwood written by the patient who has pulmonary fibrosis and scleroderma.  She  is going to read it.  We did discuss briefly clinical trials as a care option and she is interested  -No clearing of the throat.  -She has seen rheumatology recently.  She continues to have [name].  They have a nitroglycerin .  She does not want to try it.  We discussed about using heart warmers.  She is going to buy this   CT Chest data  XR C-ARM NO REPORT  Result Date: 09/07/2021 Please see Notes tab for imaging impression.      OV 04/21/2022  Subjective:  Patient ID: Tara JINNY Welford, female , DOB: 1940/09/24 , age 19 y.o. , MRN: [id number] , ADDRESS: [address] [place] [address] PCP Cleotilde Planas, MD Patient Care Team: Cleotilde Planas, MD as PCP - General (Family Medicine) Fernande Elspeth BROCKS, MD as PCP - Cardiology (Cardiology) Freddie Lynwood HERO, MD as Consulting Physician (Hematology) Fernande Elspeth BROCKS, MD as Consulting Physician (Cardiology) Luis Purchase, MD as Consulting Physician (Gastroenterology)  This Provider for this visit: Treatment Team:  Attending Provider: Geronimo Amel, MD  04/21/2022 -   Chief Complaint  Patient presents with   Follow-up    6 Follow-up PT states no changes, DOE (upstairs, fast paced walking)     HPI [name] 84 y.o. -returns for follow-up.  She tells me that since last seeing me since the summer or so she is slightly more short of breath especially when she climbs stairs but overall symptom score is stable.  In September 2023 she went on vacation to [facility] [place]  and came back with her girlfriends and she had a great time but she did notice more shortness of breath than usual.  She attributed this to heavier exertion but nevertheless with stairs she is  finding herself more short of breath.  She is compliant with Spiriva  for associated emphysema Myfortic  Bactrim  and daily prednisone .  2 days ago she saw Cheryl Birmingham rheumatology PA for her scleroderma and has been deemed stable  Pulmonary function test shows  slight reduction in FVC over time but still within normal limits but the DLCO is dramatically low.  There is no anemia correction on the DLCO.  When I did a rough anemia correction it appears that DLCO might be okay.  Of asked for a formal correction on the DLCO for anemia.  Given her anemia which appears to be relatively more pronounced compared to Jun 01, 2023greater than 1.5 g% drop she has been referred by rheumatology to hematology because of history of hemochromatosis.  Her last echo and CT scan of the chest were earlier this year  We discussed about the 3 different respiratory vaccines.  Strongly recommended COVID mRNA because she is immunocompromised.  She did have outpatient COVID December 2022 but it was a different strain.  Also recommend high-dose flu shot which she will get today and we discussed RSV vaccine.     PFT  OV 05/26/2022  Subjective:  Patient ID: [name], female , DOB: 12-28-1940 , age 48 y.o. , MRN: [id number] , ADDRESS: [address] [place] [address] PCP Cleotilde Planas, MD Patient Care Team: Cleotilde Planas, MD as PCP - General (Family Medicine) Fernande Elspeth BROCKS, MD as PCP - Cardiology (Cardiology) Fernande Elspeth BROCKS, MD as Consulting Physician (Cardiology) Luis Purchase, MD as Consulting Physician (Gastroenterology) Federico Norleen ONEIDA MADISON, MD as Consulting Physician (Hematology and Oncology)  This Provider for this visit: Treatment Team:  Attending Provider: Geronimo Amel, MD    05/26/2022 -   Chief Complaint  Patient presents with   Acute Visit    Pt started having complaints of dry cough, wheezing, and chest discomfort which began 1 week ago. Has done multiple covid tests which all have come back negative. States she is now coughing up phlegm and also has complaints of a headache and has no energy.   HPI [name] 84 y.o. -she is making an acute visit.  She presents with her daughter Rocky who I have met before.  She was on a  [place] cruise out of [place] between May 14, 2022 and Saturday after Thanksgiving May 20, 2022.  On Friday after Thanksgiving she suddenly took ill.  Symptoms got worse on Saturday when she returned.  She describes laryngitis ear pain cough headaches sore throat runny nose brain fog increased fatigue and sleeping.  COVID test x 2 was negative.  She called our office and we gave her antibiotic and prednisone  because the sputum is now yellow.  She is taking this but she wanted an acute visit today.  Some symptoms are better.  She is saying that her headache is better sore throat is better congestion is better and the tiredness is better but according to the daughter she still sleeping a lot.  Her cough nature has changed and she is got some yellow sputum now.  So some symptoms are not better.  Walking desaturation test is stable but she only walked 2 laps and she stopped because of fatigue.  The cough is particularly worse at night.  She does not want any cough medication  OV 07/08/2022  Subjective:  Patient ID: [name], female , DOB: 25-May-1941 , age 30 y.o. , MRN: [id number] , ADDRESS: [address] [place] [address] PCP Cleotilde Planas, MD Patient Care Team: Cleotilde Planas, MD as PCP - General (Family Medicine) Fernande Elspeth BROCKS, MD as PCP - Cardiology (Cardiology) Fernande Elspeth BROCKS, MD as Consulting Physician (Cardiology) Luis Purchase, MD as Consulting Physician (Gastroenterology) Federico Norleen ONEIDA MADISON, MD as Consulting Physician (Hematology and Oncology)  This Provider for this visit: Treatment Team:  Attending Provider: Geronimo Amel, MD     07/08/2022 -   Chief Complaint  Patient presents with   Follow-up    Spiro/dlco done today. She states had a hospitalization 06/20/22 with compression fx. Her cough is about the same- occ prod and has to clear throat but does not cough out the sputum. Breathing is at baseline.      HPI [name] 84 y.o.  -returns for follow-up.  I saw her just after Thanksgiving 2023 when she had come back from a cruise and was having respiratory symptoms of acute bronchitis.  She at that time was through her doxycycline  and prednisone  taper she had significant cough.  It appears that after that visit her cough continued unabated.  She was coughing a lot at night.  Then she ended up in December 2023 according to history and review of the external records in the hospital with L2 spinal compression fracture.  She believes the cough caused it.  I did explain to her that I have clinically not seen lumbar spinal compression fracture from cough [I subsequently discussed with Dr. Ozell America about cough expert and he has seen a few patients with lumbar spine compression fracture with severe cough].  In the hospital hemoglobin was 12.3 g%.  And a creatinine was 0.8 mg percent.  She is now back home she is improving.  She is status post kyphoplasty 06/30/2022.  She still has a cough she says she is 80% better compared to when she saw me in November 2023.  She still has some nighttime cough.  She is willing to try some Tessalon  Perles all she is tried so far is Delsym cough syrup.  She continues on her chronic immunosuppressive medications.  At the last visit I ordered an echocardiogram: This is currently pending and is going to be done soon  At the last visit I ordered high-resolution CT chest but in the hospital she did have a CT angiogram: Mild ILD is reported although this is a contrasted CT.  This was done on 06/20/2022.  I personally visualized and independently agree with the findings  She did a pulmonary function test today: This is stable.     OV 11/24/2022  Subjective:  Patient ID: [name], female , DOB: 1940-08-05 , age 23 y.o. , MRN: [id number] , ADDRESS: [address] [place] [address] PCP Cleotilde Planas, MD Patient Care Team: Cleotilde Planas, MD as PCP - General (Family Medicine) Fernande Elspeth BROCKS, MD as PCP - Cardiology (Cardiology) Fernande Elspeth BROCKS, MD as Consulting Physician (Cardiology) Luis Purchase, MD as Consulting Physician (Gastroenterology) Federico Norleen ONEIDA MADISON, MD as Consulting Physician (Hematology and Oncology)  This Provider for this visit: Treatment Team:  Attending Provider: Geronimo Amel, MD  11/24/2022 -   Chief Complaint  Patient presents with   Follow-up    Annual f/up     HPI [name] 84 y.o. -returns for follow-up.  She says her shortness of breath itself is stable.  Exercise size sit/stand desaturation test today stable.  No new medical problems.  No emergency room visits.  However on general dynamics  day weekend she did go to the emergency room and had an infection of the right small toe.  She is on doxycycline .  Other than that she is well.  But the main issue is that her chronic severe cough persist.  This cough got worse in November 2023/January 2024 after a cruise and viral infection and spinal fracture.  This continues unabated.  Wakes her up at night.  As a tickling of the chest there is no wheezing there is no fever there is a dry cough.  Review of the labs indicate she has never had allergy panel test.  Her eosinophils are slightly elevated.  Her last CT scan of the chest was in December 2023 but was an angiogram.  Showed mild ILD.   feNO could NOT do  OV 12/15/2022  Subjective:  Patient ID: [name], female , DOB: 17-Mar-1941 , age 18 y.o. , MRN: [id number] , ADDRESS: [address] [place] [address] PCP Cleotilde Planas, MD Patient Care Team: Cleotilde Planas, MD as PCP - General (Family Medicine) Fernande Elspeth BROCKS, MD as PCP - Cardiology (Cardiology) Fernande Elspeth BROCKS, MD as Consulting Physician (Cardiology) Luis Purchase, MD as Consulting Physician (Gastroenterology) Federico Norleen ONEIDA MADISON, MD as Consulting Physician (Hematology and Oncology)  This Provider for this visit: Treatment Team:  Attending Provider: Geronimo Amel,  MD   12/15/2022 -   Chief Complaint  Patient presents with   Follow-up    F/up on PFT and blood work results   Type of visit: Video Virtual Visit Identification of patient Tara HELLUMS with April 11, 1941 and MRN [id number] - 2 person identifier Risks: Risks, benefits, limitations of telephone visit explained. Patient understood and verbalized agreement to proceed Anyone else on call: just patien Patient location: her home This provider location: [address]; [place]; [place] [address]. [facility]. [phone]    HPI DENEISHA DADE 84 y.o. -returns for follow-up on this video visit.  This video visit is to discuss some of the evaluation we did for chronic cough.  I noticed that her cat dander is slightly positive but she tells me that she has no cats at all and no exposure to cats recently.  Otherwise allergy panel is negative.  She did have pulmonary function test yesterday and shows continued stability.  Overall in terms of her symptoms she tells me she stable she is continuing her medications.  She says the cough is slightly better.  The frequency is much less but when it comes to severe it is the same but overall she feels improved and stable.  She feels it was the allergy season that made her cough more.  We discussed the potential role of bronchoscopy with lavage rule out opportunistic infections which I said was low probability of finding alternative etiologies for cough.  We also discussed the option of waiting and watching.  She prefer the latter.  Will see her back in 6 months with a spirometry and DLCO.     OV 07/06/2023  Subjective:  Patient ID: [name], female , DOB: 11/15/1940 , age 66 y.o. , MRN: [id number] , ADDRESS: [address] [place] [address] PCP Cleotilde Planas, MD Patient Care Team: Cleotilde Planas, MD as PCP - General (Family Medicine) Fernande Elspeth BROCKS, MD as PCP - Cardiology (Cardiology) Fernande Elspeth BROCKS, MD as  Consulting Physician (Cardiology) Luis Purchase, MD as Consulting Physician (Gastroenterology) Federico Norleen ONEIDA MADISON, MD as Consulting Physician (Hematology and  Oncology)  This Provider for this visit: Treatment Team:  Attending Provider: Geronimo Amel, MD   07/06/2023 -   Chief Complaint  Patient presents with   Follow-up    Pft f/u, denies any concerns states she is the same.      HPI CYERA BALBONI 84 y.o. -returns for a 80-month follow-up.  She says she has been doing well from a respiratory standpoint.  She continues on Myfortic  without any side effects other than very occasional diarrhea.  She continues low-dose prednisone  5 mg and also Bactrim  prophylaxis.  For associated emphysema she continue Spiriva .  She says she safety labs not otherwise specified at primary care office last month.  She is reluctant to get them tested today.  Most recent labs in October 2024 liver function test and chemistries were normal within our system.  Therefore I have accepted that.  She states on June 25, 2023 she fell on a staircase and sustained a 7-10 cm longitudinal laceration on the distal third of the left shin.  It is nonhealing at this point.  There was a lot of oozing of blood.  Since yesterday she is at the wound center.  Did explain to her along with aging and chronic prednisone  contributing to poor wound healing.  Asked if she is willing to taper the prednisone  slowly.  She is willing to give this a try.  She had pulmonary function test today and her lung function stable for the last 2 years.  Last CT scan was 2 years ago and she is willing to have another 1 at follow-up.     OV 11/16/2023  Subjective:  Patient ID: [name], female , DOB: [birth year] , age 23 y.o. , MRN: [id number] , ADDRESS: [address] [place] [address] PCP Cleotilde Planas, MD Patient Care Team: Cleotilde Planas, MD as PCP - General (Family Medicine) Fernande Elspeth BROCKS, MD as PCP - Cardiology  (Cardiology) Fernande Elspeth BROCKS, MD as Consulting Physician (Cardiology) Luis Purchase, MD as Consulting Physician (Gastroenterology) Federico Norleen ONEIDA MADISON, MD as Consulting Physician (Hematology and Oncology)  This Provider for this visit: Treatment Team:  Attending Provider: Geronimo Amel, MD    11/16/2023 -   Chief Complaint  Patient presents with   Follow-up    Breathing is doing well and she denies any new co's.      HPI GIANNY KILLMAN 84 y.o. -returns for follow-up.  She reports overall stability in pulmonary health.  Last visit because of prolonged prednisone  intake for many years and ongoing pulmonary stability and because of shin ulcer I recommend coming off prednisone .  By March April 2025 she had significant prednisone  withdrawal and that she started having cough and fatigue and feeling poorly.  She went back on prednisone  and since then she is feeling well.  The cough is just minimal.  Shortness of breath is stable.  She CT scan of the chest that I personally visualized show due to her ILD stable now for couple of years.  She is pleased by this.  In April 2025 she did have blood labs that can be considered as part of Myfortic  monitoring and these are stable.  Since her last visit she has seen oncology who is monitoring her anemia.  She is also seeing GI because of her diarrhea and they are going to do procedures on her.  She and I discussed the fact diarrhea could be because of Myfortic   She also had a CT head as follow-up  with her neurologist.   CT Chest data from date: apiril 2025  - personally visualized and independently interpreted : yes - my findings are: as below Narrative & Impression  CLINICAL DATA:  Diffuse/interstitial lung disease.   EXAM: CT CHEST WITHOUT CONTRAST   TECHNIQUE: Multidetector CT imaging of the chest was performed following the standard protocol without intravenous contrast. High resolution imaging of the lungs, as well as inspiratory and  expiratory imaging, was performed.   RADIATION DOSE REDUCTION: This exam was performed according to the departmental dose-optimization program which includes automated exposure control, adjustment of the mA and/or kV according to patient size and/or use of iterative reconstruction technique.   COMPARISON:  06/20/2022 and 08/24/2021.   FINDINGS: Cardiovascular: Atherosclerotic calcification of the aorta, aortic valve and coronary arteries. Enlarged pulmonic trunk and heart. No pericardial effusion.   Mediastinum/Nodes: No pathologically enlarged mediastinal or axillary lymph nodes. Hilar regions are difficult to definitively evaluate without IV contrast. Esophagus is grossly unremarkable.   Lungs/Pleura: Centrilobular and paraseptal emphysema. Mild basilar predominant subpleural reticular densities and ground-glass, similar to 06/20/2022. There may be minimal associated bronchiolectasis. No honeycombing. Posterior left lower lobe nodule measures 5 mm (4 x 6 mm, 7/81), unchanged from 06/20/2022. Per Fleischner Society guidelines, no follow-up is necessary. No new pulmonary nodules. No pleural fluid. Airway is unremarkable. Minimal air trapping.   Upper Abdomen: Small hiatal hernia. Visualized portions of the liver, gallbladder, adrenal glands, kidneys, spleen, pancreas, stomach and bowel are otherwise grossly unremarkable. No upper abdominal adenopathy.   Musculoskeletal: Upper lumbar vertebral body augmentations. Osteopenia. Degenerative changes in the spine. Age indeterminate mild T10 compression fracture.   IMPRESSION: 1. Mild basilar predominant subpleural reticular densities and ground-glass with questionable bronchiolectasis, similar to 06/20/2022. Findings are categorized as probable UIP per consensus guidelines: Diagnosis of Idiopathic Pulmonary Fibrosis: An Official ATS/ERS/JRS/ALAT Clinical Practice Guideline. Am JINNY Honey Crit Care Med Vol 198, Iss 5, [phone], Feb 25 2017. Findings are indeterminate for UIP per consensus guidelines: Diagnosis of Idiopathic Pulmonary Fibrosis: An Official ATS/ERS/JRS/ALAT Clinical Practice Guideline. Am JINNY Honey Crit Care Med Vol 198, Iss 5, [phone], Feb 25 2017. 2. Minimal air trapping is indicative of small airways disease. 3. Age indeterminate mild T10 compression fracture. 4. Aortic atherosclerosis (ICD10-I70.0). Coronary artery calcification. 5. Enlarged pulmonic trunk, indicative of pulmonary arterial hypertension. 6.  Emphysema (ICD10-J43.9).     Electronically Signed   By: Newell Eke M.D.   On: 10/18/2023 15:57     OV 05/14/2024  Subjective:  Patient ID: [name], female , DOB: 14-Sep-1940 , age 35 y.o. , MRN: [id number] , ADDRESS: [address] [place] [address] PCP Cleotilde Planas, MD Patient Care Team: Cleotilde Planas, MD as PCP - General (Family Medicine) Almetta Donnice LABOR, MD as PCP - Electrophysiology (Cardiology) Luis Purchase, MD as Consulting Physician (Gastroenterology) Federico Norleen ONEIDA MADISON, MD as Consulting Physician (Hematology and Oncology)  This Provider for this visit: Treatment Team:  Attending Provider: Geronimo Amel, MD    05/14/2024 -   Chief Complaint  Patient presents with   Interstitial Lung Disease    Pt states since LOV breathing has gotten worse SOB w/ any activity,  Dry cough Pt states face has been puffy for a while now, thinks its fluid  Also pt stopped taking Cathey stated it was not helping      HPI ANNAMARY BUSCHMAN 84 y.o. -TRYPHENA PERKOVICH is an 84 year old female who presents with a persistent dry cough and  shortness of breath. She is accompanied by her daughte ERIN (a reatlor ) She has been experiencing a persistent dry cough for the past few months, describing it as 'hard' and 'deep.' The cough has progressively worsened over time. She recalls a similar episode a few years ago following an RSV infection, which also resulted in  a prolonged cough.  Significant fatigue has been present over the past few months, which she attributes to the persistent cough. The cough disrupts her sleep, waking her at night. Her family has noted wheezing, although she does not hear it herself due to wearing hearing aids.  In addition to the cough, she has experienced shortness of breath for the past three weeks. This occurs with minimal exertion, such as walking from the sofa to the kitchen while carrying dishes, and has progressively worsened, impacting her ability to perform daily activities.  Over the past week, she has noticed facial puffiness and swelling in her hands and feet. Her daughter observed the facial swelling during a recent trip. She has gained approximately seven pounds in the last few weeks, which she attributes to fluid retention, as evidenced by her shoes becoming tight.  No fever, chills, or vomiting, but she experiences some nausea.   Her current medications include Vifortic, prednisone , Bactrim , and Eliquis . She completed a course of prednisone  and azithromycin prescribed by her primary care physician a few weeks ago, which did not alleviate her symptoms. She drinks a lot of water and does not use oxygen at night.  CXR 2d ago visyualized - small new pleft pleural effusion  Daughter is indy historian  OV 06/06/2024  Subjective:  Patient ID: [name], female , DOB: 08-May-1941 , age 37 y.o. , MRN: [id number] , ADDRESS: [address] [place] [address] PCP Cleotilde Planas, MD Patient Care Team: Cleotilde Planas, MD as PCP - General (Family Medicine) Almetta Donnice LABOR, MD as PCP - Electrophysiology (Cardiology) Luis Purchase, MD as Consulting Physician (Gastroenterology) Federico Norleen ONEIDA MADISON, MD as Consulting Physician (Hematology and Oncology)  This Provider for this visit: Treatment Team:  Attending Provider: Geronimo Amel, MD    06/06/2024 -   Chief Complaint  Patient presents with    Interstitial Lung Disease    Pt states since LOV breathing has been better SOB occurs w/ exertion Dry cough    023  HPI JAKEISHA STRICKER 84 y.o. -REID NAWROT is an 84 year old female withd scleroderma and Mild ILD on Myfortic    She was seen acutely a few weeks ago.  At the time she had signs of pulmonary congestion with volume overload.  Following a visit the BNP was greater than 700.  She is known to have grade 2 diastolic dysfunction.  Started on Lasix .  With the Lasix  she tells me nowShe has experienced significant weight loss, losing 14 pounds within 36 hours after taking a lower dose of Lasix , followed by an additional 4 pounds over the next few days. This weight loss is attributed to fluid reduction, as there was a marked improvement in shortness of breath and puffiness after taking Lasix . She is currently using Lasix  on an as-needed basis.  She is now seen electrophysiologist Dr. Almetta and subsequent to this . She underwent cardioversion on December 3rd and is participating in a research study related to cardioversion.  All this is improved her shortness of breath but she is not fully back to baseline yet.  Because worsening ILD was in the differential diagnosis we also  got a CT scan of the chest.  The ILD is very mild and it is stable.  The radiologist felt there is evidence of pulmonary congestion still going on.  I personally visualized the CT chest and showed it to her.  I did indicate to her that the ILD is mild and she has been on Myfortic  form of mycophenolate  for many years and wondered if we could stop it.  She is kind of nervous about stopping it.  She feels it has helped put her disease in remission.  She recently saw Cheryl Birmingham the rheumatologist PA.  I reviewed her notes.  I have no reach out to her to see if patient can come off Myfortic       CT Chest data from date: November 2025  - personally visualized and independently interpreted : yes - my findings are: as  below Narrative & Impression  CLINICAL DATA:  Diffuse/interstitial lung disease.   EXAM: CT CHEST WITHOUT CONTRAST   TECHNIQUE: Multidetector CT imaging of the chest was performed following the standard protocol without intravenous contrast. High resolution imaging of the lungs, as well as inspiratory and expiratory imaging, was performed.   RADIATION DOSE REDUCTION: This exam was performed according to the departmental dose-optimization program which includes automated exposure control, adjustment of the mA and/or kV according to patient size and/or use of iterative reconstruction technique.   COMPARISON:  11/20/2023 and 06/20/2022.   FINDINGS: Cardiovascular: Atherosclerotic calcification of the aorta, aortic valve and coronary arteries. Enlarged pulmonic trunk and heart. No pericardial effusion.   Mediastinum/Nodes: No pathologically enlarged mediastinal or axillary lymph nodes. Hilar regions are difficult to definitively evaluate without IV contrast. Esophagus is grossly unremarkable.   Lungs/Pleura: Centrilobular and paraseptal emphysema. Minimal peribronchovascular ground-glass in the right middle lobe, likely infectious/inflammatory in etiology. Mild basilar predominant subpleural reticular densities and ground-glass. Suspect superimposed septal thickening in the lung bases with tiny bilateral pleural effusions. 4 mm posterior left lower lobe nodule (5/78), unchanged from 06/20/2022 and benign. Airway is unremarkable. No air trapping.   Upper Abdomen: Visualized portions of the liver, gallbladder, adrenal glands, kidneys, spleen, pancreas, stomach and bowel are grossly unremarkable. No upper abdominal adenopathy.   Musculoskeletal: Degenerative changes in the spine. Vertebral body augmentations. Osteopenia. T10 sclerosis may be due to mild compression, unchanged. Old sternal fracture.   IMPRESSION: 1. Suspect mild basilar interstitial lung abnormality  or interstitial lung disease. Findings are categorized as probable UIP per consensus guidelines: Diagnosis of Idiopathic Pulmonary Fibrosis: An Official ATS/ERS/JRS/ALAT Clinical Practice Guideline. Am JINNY Honey Crit Care Med Vol 198, Iss 5, [phone], Feb 25 2017. 2. Favor mild congestive heart failure. 3. Aortic atherosclerosis (ICD10-I70.0). Coronary artery calcification. 4. Enlarged pulmonic trunk, indicative of pulmonary arterial hypertension. 5.  Emphysema (ICD10-J43.9).     Electronically Signed   By: Newell Eke M.D.   On: 05/20/2024 11:53            OV 07/05/2024  Subjective:  Patient ID: [name], female , DOB: 07/31/1940 , age 56 y.o. , MRN: [id number] , ADDRESS: [address] [place] [address] PCP Cleotilde Planas, MD Patient Care Team: Cleotilde Planas, MD as PCP - General (Family Medicine) Almetta Donnice LABOR, MD as PCP - Electrophysiology (Cardiology) Luis Purchase, MD as Consulting Physician (Gastroenterology) Federico Norleen ONEIDA MADISON, MD as Consulting Physician (Hematology and Oncology)  This Provider for this visit: Treatment Team:  Attending Provider: Geronimo Amel, MD    07/05/2024 -   Chief Complaint  Patient  presents with   Medical Management of Chronic Issues   Interstitial Lung Disease    PFT repeated today. Breathing has been stable, maybe slightly worse. She has some non prod cough.      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  Has associated hemochromatosis  Has associated atrial fibrillation on Xarelto   Mild associated diastolic dysfunction in pst but not on echo  Feb  2023  LBP with L2 compression frcture status post kyphoplasty 1//24.- dec 2023 after viral inection during cruise nov 2  HPI [name] 84 y.o. -TAVIANA WESTERGREN is an 84 year old female withd scleroderma  ILD on Myfortic .  Mid November 2025 she started having evidence of cardiac decompensation and RV failure.  BNP was high she got diuresed and she got better.  Then there was question but her pulmonary fibrosis itself is stable.  So high-resolution CT chest 05/18/2024 showed and stability in the ILD.  7 I saw her in December 2025 because the ILD was mild and stable and she was doing otherwise well I was wondering about tapering her Myfortic  and I got approval for this from Cheryl Birmingham her rheumatology physician assistant.  Dale Taylor approved reduction in dose but not a.  Which because she is worried about scleroderma flaring up.  Moreover the ILD is mild.  She is currently taking Myfortic  as usual dose.  Today the purpose was to have this conversation and assessment but she feels the atrial fibrillation never responded to the cardioversion. She underwent cardioversion prior to her last visit in December. Three days ago, she felt that her atrial fibrillation had returned, with the sensation being more pronounced than usual. She believes the cardioversion did not work as intended.  She also believes She is experiencing increased shortness of breath. She has not had any recent emergency room or urgent care visits.Her past medical history includes scleroderma, and she has been experiencing symptoms such as swelling. Her weight has been stable, although she noted a three-pound increase since the previous day. She is monitoring her weight closely and uses Lasix  as needed for fluid management.  Deep down she feels it is a cardiac cause for having more dyspnea than baseline  Review of's dyspnea symptom score shows that while her dyspnea is better than November 2025 when she had acute heart failure  decompensation she still has not returned to the May 2025 baseline.  Her BNP though improved is still high at 377.  She had an echocardiogram that shows abnormal longitudinal strain but also very likely she has got pulmonary hypertension.  She had pulmonary function test that shows reduction in both FVC and DLCO but the ILD recently has been deemed stable.  And the reduction in DLCO seems to be a little out of proportion compared to the reduction in [facility] raising the possibility of pulmonary hypertension [WHO Group 1 versus group 3 versus both]       SYMPTOM SCALE - ILD 07/06/2023 11/16/2023  05/14/2024 Acute -> BNP > 700 and srarted lasix  06/06/2024 Now lasix  prn . Lost weight 07/05/2024   Current weight       O2 use ra ra ra  Shortness of Breath 0 -> 5 scale with 5 being worst (score 6 If unable to do)      At rest 0 1 4 1 1   Simple tasks - showers, clothes change, eating, shaving 1 1.5 4 2.5 2  Household (dishes, doing bed, laundry) 1 1.5 4 2.5 2  Shopping 1 1 4  2.5 2  Walking level at own pace 1 1.5 4 2.5 2.5  Walking up Stairs 2 2 24 3  2.5  Total (30-36) Dyspnea Score 6 8.5 20 14 12   How bad is your cough? 3 2 5 4 3   How bad is your fatigue 2 2 4 3 3   appetire   3 0 0  How bad is nausea 1 0.5 1.5 0 0  How bad is vomiting?  0 0 0 2.5 1.5  How bad is diarrhea? 2 2,5 2.5 0 0  How bad is anxiety? 0 0 0 0 0  How bad is depression 0 0 0 0 0  Any chronic pain - if so where and how bad 1 0 3 fluid retenion  0          Simple office walk 224 (66+46 x 2) feet Pod A at [facility] x  3 laps goal with forehead probe 05/14/2024  06/06/2024   O2 used ra   Number laps completed 2 of 3   Comments about pace Norma pace   Resting Pulse Ox/HR 95% and 113/min   Final Pulse Ox/HR 94% and 115/min   Desaturated </= 88% no   Desaturated <= 3% points no   Got Tachycardic >/= 90/min yes   Symptoms at end of test dyspneic   Miscellaneous comments X- STOPPED ERARLY         SIT STAND TEST  - goal 15 times   06/06/2024    O2 used ra   PRobe - finter or forehead finager   Number sit and stand completed - goal 15 15   Time taken to complete 53 sec   Resting Pulse Ox/HR/Dyspnea  97% and 59/min and dyspnea of 1/10    Peak measures 98 % and 70/min and dyspnea of 2/10   Final Pulse Ox/HR 96% and 62/min and dyspnea of 2.5/10   Desaturated </= 88% no   Desaturated <= 3% points no   Got Tachycardic >/= 90/min no   Miscellaneous comments better      PFT     Latest Ref Rng & Units 07/05/2024    8:19 AM 07/06/2023   11:26 AM 12/14/2022   10:27 AM 07/08/2022    2:57 PM 04/12/2022   10:27 AM 09/09/2021   10:46 AM 08/18/2020    2:49 PM  PFT Results  FVC-Pre L 2.29  P 2.57  2.57  2.52  2.48  2.67  2.78   FVC-Predicted Pre % 93  P 102  102  99  96  103  105   Pre FEV1/FVC % % 69  P 71  70  73  72  68  68   FEV1-Pre L 1.59  P 1.83  1.80  1.84  1.80  1.81  1.88   FEV1-Predicted Pre % 86  P 98  96  97  93  94  96   DLCO uncorrected ml/min/mmHg 9.23  P 11.05  11.48  11.51  9.26  11.33  11.74   DLCO UNC% % 50  P 60  62  61  49  60  62   DLCO corrected ml/min/mmHg  9.64  P 11.05  11.78  11.93  9.26  12.46  11.74   DLCO COR %Predicted % 52  P 60  63  64  49  66  62   DLVA Predicted % 65  P 66  71  73  57  70  65     P Preliminary result     Latest Reference Range & Units 05/14/24 14:05 06/06/24 11:24  Pro B Natriuretic peptide (BNP) 0.0 - 100.0 pg/mL 754.0 (H) 377.0 (H)  (H): Data is abnormally high   LAB RESULTS last 96 hours No results found.     IMPRESSIONS - ECHO 06/14/24    1. Left ventricular ejection fraction, by estimation, is 65 to 70%. Left  ventricular ejection fraction by 3D volume is 69 %. The left ventricle has  normal function. The left ventricle has no regional wall motion  abnormalities. indeterminate due to lack  of tissue doppler. The average left ventricular global longitudinal strain  is -15.2 %. The global longitudinal strain is abnormal.   2. Right  ventricular systolic function is normal. The right ventricular  size is normal. There is mildly elevated pulmonary artery systolic  pressure. The estimated right ventricular systolic pressure is 36.6 mmHg.   3. Left atrial size was moderately dilated.   4. Right atrial size was mildly dilated.   5. The mitral valve is abnormal. Mild mitral valve regurgitation.   6. The aortic valve is tricuspid. Aortic valve regurgitation is mild.  Aortic valve sclerosis/calcification is present, without any evidence of  aortic stenosis.   7. The inferior vena cava is normal in size with greater than 50%  respiratory variability, suggesting right atrial pressure of 3 mmHg.   Comparison(s): Changes from prior study are noted. 11/21/2023: LVEF 65-70%,  mild ASH, grade 2 DD, mild AI.    has a past medical history of Anxiety, Atrial fibrillation (HCC), Chest pain, GERD (gastroesophageal reflux disease), Hemochromatosis, History of syncope, Hypothyroidism, and Osteoporosis.   reports that she quit smoking about 27 years ago. Her smoking use included cigarettes. She started smoking about 69 years ago. She has a 42 pack-year smoking history. She has never been exposed to tobacco smoke. She has never used smokeless tobacco.  Past Surgical History:  Procedure Laterality Date   CARDIOVERSION N/A 05/29/2024   Procedure: CARDIOVERSION;  Surgeon: Sheena Pugh, DO;  Location: [facility];  Service: Cardiovascular;  Laterality: N/A;   COLONOSCOPY     IR KYPHO LUMBAR INC FX REDUCE BONE BX UNI/BIL CANNULATION INC/IMAGING  06/30/2022   IR KYPHO LUMBAR INC FX REDUCE BONE BX UNI/BIL CANNULATION INC/IMAGING  08/16/2022   MOUTH SURGERY     SHOULDER ARTHROSCOPY Left    TOTAL ABDOMINAL HYSTERECTOMY     UPPER GASTROINTESTINAL ENDOSCOPY     VARICOSE VEIN SURGERY      Allergies[1]  Immunization History  Administered Date(s) Administered    sv, Bivalent, Protein Subunit Rsvpref,pf (Abrysvo) 06/14/2023   Fluad  Quad(high Dose 65+) 02/18/2019, 04/21/2022   Fluzone Influenza virus vaccine,trivalent (IIV3), split virus 03/27/2019   INFLUENZA, HIGH DOSE SEASONAL PF 08/29/2017, 04/26/2018, 04/29/2020, 05/12/2021, 04/18/2024   PFIZER(Purple Top)SARS-COV-2 Vaccination 07/22/2019, 08/12/2019, 03/11/2020   PNEUMOCOCCAL CONJUGATE-20 05/31/2023   Pfizer(Comirnaty)Fall Seasonal Vaccine 12 years and older 04/18/2024   Pneumococcal Conjugate-13 06/12/2014   Pneumococcal Polysaccharide-23 06/27/2010, 04/25/2018   Tdap 03/16/2011, 07/05/2019   Zoster Recombinant(Shingrix) 04/08/2019   Zoster, Live 06/27/2010, 04/08/2019    Family History  Problem Relation Age of Onset  Stroke Mother    Heart disease Father 81   Hemochromatosis Brother    Breast cancer Neg Hx    Colon cancer Neg Hx    Esophageal cancer Neg Hx    Rectal cancer Neg Hx    Stomach cancer Neg Hx     Current Medications[2]      Objective:   Vitals:   07/05/24 0935  BP: 126/76  Pulse: (!) 56  SpO2: 94%  Weight: 131 lb (59.4 kg)  Height: 5' 3 (1.6 m)    Estimated body mass index is 23.21 kg/m as calculated from the following:   Height as of this encounter: 5' 3 (1.6 m).   Weight as of this encounter: 131 lb (59.4 kg).  @WEIGHTCHANGE @  Filed Weights   07/05/24 0935  Weight: 131 lb (59.4 kg)     Physical Exam   General: No distress. Looks wel O2 at rest: no Cane present: no Sitting in wheel chair: no Frail: no Obese: no Neuro: Alert and Oriented x 3. GCS 15. Speech normal Psych: Pleasant Resp:  Barrel Chest - no.  Wheeze - no, Crackles - mild maybe at base, No overt respiratory distress CVS: Normal heart sounds. Murmurs - no Ext: Stigmata of Connective Tissue Disease - Scleroderma HEENT: Normal upper airway. PEERL +. No post nasal drip        Assessment/     Assessment & Plan Right ventricular failure (HCC)  Interstitial lung disease due to connective tissue disease (HCC)    PLAN Patient  Instructions  Concern for pulmonary hypertension  - Given recent edema and BNP elevation, refractory A-fib, echo suggesting longitudinal strain along with elevations in pulmonary pressures and underlying scleroderma with ILD, along with DLCO decline and pulmonary function test out of proportion to [facility] decline I believe there is a probability that you have developed pulmonary hypertension  Plan  - Sent a message to Dr. Donnice Primus to see if he agrees and feels it is safe to do a right heart catheterization - I will get back to you about Dr. Garnette opinion   Interstitial pulmonary disease (HCC) Scl-70 antibody positive [name] phenomenon without gangrene High risk medication use Therapeutic drug monitoring    - CLinically stable ILD x  2-3 years . CT  April 2025 and Dec 2025 with ild stble ILD and emphsye  - Could not tolertate coming off prednisone  - Cheryl Birmingham your rheumatology PA is aligned in reducing Myfortic  but given current concern over pulmonary hypertension we going to keep you on the same dose.  Plan -Continue prednisone  5mg  daily -Continue Myfortic  as before   - Sometime in 2026 we can look at reducing this based on your other clinical behavior - Continue Bactrim  as before   Pulmonary emphysema, unspecified emphysema type (HCC)  Plan -  Continue spiriva    Followup -2 months Dr. Geronimo 30-minute visit; nurse practitioner if my appointments not available    FOLLOWUP    Return for -2 months Dr. Geronimo 30-minute visit; nurse practitioner if my appointments not available.  ( Level 05 visit E&M 2024: Estb >= 40 min   in  visit type: on-site physical face to visit  in total care time and counseling or/and coordination of care by this undersigned MD - Dr Dorethia Geronimo. This includes one or more of the following on this same day 07/05/2024: pre-charting, chart review, note writing, documentation discussion of test results, diagnostic or treatment  recommendations, prognosis, risks and benefits of management options, instructions, education,  compliance or risk-factor reduction. It excludes time spent by the CMA or office staff in the care of the patient. Actual time 45 min)   SIGNATURE    Dr. Dorethia Cave, M.D., F.C.C.P,  Pulmonary and Critical Care Medicine Staff Physician, [facility] Center Director - Interstitial Lung Disease  Program  Pulmonary Fibrosis [facility] at [facility] [place], [place], [address]  Pager: [phone], If no answer or between  15:00h - 7:00h: call [phone] Telephone: [phone]  12:57 PM 07/05/2024     [1] No Known Allergies [2]  Current Outpatient Medications:    acetaminophen  (TYLENOL ) 500 MG tablet, Take 500-1,000 mg by mouth every 6 (six) hours as needed for mild pain (pain score 1-3) or headache., Disp: , Rfl:    apixaban  (ELIQUIS ) 5 MG TABS tablet, Take 1 tablet (5 mg total) by mouth 2 (two) times daily., Disp: 180 tablet, Rfl: 3   atorvastatin  (LIPITOR) 20 MG tablet, Take 20 mg by mouth daily., Disp: , Rfl:    Calcium  Carbonate Antacid (TUMS PO), Take 1 tablet by mouth daily as needed (heartburn)., Disp: , Rfl:    cetirizine (ZYRTEC) 10 MG tablet, Take 10 mg by mouth daily., Disp: , Rfl:    cyanocobalamin  1000 MCG tablet, Take 1,000 mcg by mouth 3 (three) times a week., Disp: , Rfl:    Dextromethorphan HBr (DELSYM PO), Take 10 mLs by mouth daily as needed (cough)., Disp: , Rfl:    Dihydroxyaluminum Sod Carb (ROLAIDS PO), Take 1 tablet by mouth daily as needed (heartburn)., Disp: , Rfl:    flecainide  (TAMBOCOR ) 100 MG tablet, TAKE 1 TABLET(100 MG) BY MOUTH TWICE DAILY, Disp: 180 tablet, Rfl: 3   folic acid  (FOLVITE ) 1 MG tablet, Take 1 mg by mouth daily., Disp: , Rfl:    furosemide  (LASIX ) 20 MG tablet, TAKE 1 TABLET(20 MG) BY MOUTH DAILY (Patient taking differently: Take 20 mg by mouth daily as needed.), Disp: 90 tablet, Rfl: 0   levothyroxine   (SYNTHROID ) 100 MCG tablet, 1 tablet in the morning on an empty stomach Orally Once a day, Disp: , Rfl:    methocarbamol  (ROBAXIN ) 500 MG tablet, Take 1 tablet (500 mg total) by mouth daily as needed for muscle spasms., Disp: 30 tablet, Rfl: 0   metoprolol  succinate (TOPROL -XL) 25 MG 24 hr tablet, Take 25 mg by mouth daily., Disp: , Rfl:    Multiple Vitamins-Minerals (PRESERVISION AREDS 2) CAPS, Take 1 capsule by mouth in the morning and at bedtime., Disp: , Rfl:    mycophenolate  (MYFORTIC ) 360 MG TBEC EC tablet, Take 1 tablet (360 mg total) by mouth 2 (two) times daily., Disp: 180 tablet, Rfl: 3   ondansetron  (ZOFRAN ) 4 MG tablet, Take 1 tablet (4 mg total) by mouth every 8 (eight) hours as needed for nausea or vomiting., Disp: 90 tablet, Rfl: 3   pantoprazole  (PROTONIX ) 20 MG tablet, Take 20 mg by mouth 2 (two) times daily., Disp: , Rfl:    Polyethyl Glycol-Propyl Glycol (SYSTANE) 0.4-0.3 % SOLN, Place 1 drop into both eyes 2 (two) times daily., Disp: , Rfl:    potassium chloride  (KLOR-CON  M) 10 MEQ tablet, Take 1 tablet (10 mEq total) by mouth daily. (Patient taking differently: Take 10 mEq by mouth daily. Only if takes lasix ), Disp: 30 tablet, Rfl: 0   predniSONE  (DELTASONE ) 5 MG tablet, TAKE 1 TABLET(5 MG) BY MOUTH DAILY WITH BREAKFAST, Disp: 90 tablet, Rfl: 1   sulfamethoxazole -trimethoprim  (BACTRIM   DS) 800-160 MG tablet, TAKE 1 TABLET BY MOUTH ON MONDAY, WEDNESDAY AND FRIDAY AS DIRECTED, Disp: 90 tablet, Rfl: 3   Tiotropium Bromide  Monohydrate (SPIRIVA  RESPIMAT) 2.5 MCG/ACT AERS, Inhale 2 puffs into the lungs daily., Disp: 4 g, Rfl: 8   Vitamin D , Cholecalciferol , 25 MCG (1000 UT) TABS, Take 1 tablet by mouth daily., Disp: , Rfl:   "

## 2024-07-05 NOTE — Patient Instructions (Addendum)
 Concern for pulmonary hypertension  - Given recent edema and BNP elevation, refractory A-fib, echo suggesting longitudinal strain along with elevations in pulmonary pressures and underlying scleroderma with ILD, along with DLCO decline and pulmonary function test out of proportion to [facility] decline I believe there is a probability that you have developed pulmonary hypertension  Plan  - Sent a message to Dr. Donnice Primus to see if he agrees and feels it is safe to do a right heart catheterization - I will get back to you about Dr. Garnette opinion   Interstitial pulmonary disease (HCC) Scl-70 antibody positive [name] phenomenon without gangrene High risk medication use Therapeutic drug monitoring    - CLinically stable ILD x  2-3 years . CT  April 2025 and Dec 2025 with ild stble ILD and emphsye  - Could not tolertate coming off prednisone  - Cheryl Birmingham your rheumatology PA is aligned in reducing Myfortic  but given current concern over pulmonary hypertension we going to keep you on the same dose.  Plan -Continue prednisone  5mg  daily -Continue Myfortic  as before   - Sometime in 2026 we can look at reducing this based on your other clinical behavior - Continue Bactrim  as before   Pulmonary emphysema, unspecified emphysema type (HCC)  Plan -  Continue spiriva    Followup -2 months Dr. Geronimo 30-minute visit; nurse practitioner if my appointments not available

## 2024-07-05 NOTE — Telephone Encounter (Signed)
" ° °  Dear [name] = is a patient of ours.  As you know she is scleroderma.  Recently she has had decompensation from a cardiac standpoint with high BNP requiring Lasix  and also cardioversion for the A-fib.  Her interstitial lung disease is stable on the CT scan but on the pulmonary function test today the DLCO had a sharper decline.  This associated with her recent clinical behavior and also the recent echo showing elevated pulmonary pressures makes me concerned about scleroderma related pulmonary hypertension.    I believe a right heart cath catheterization is indicated with intention to treat..  If you are aligned please let me know.  I can then communicate with her.  There were choices of doing the same procedure through research protocol and also standard of care and I can get back to you on that   I did tell her that I would reach out to you  Thanks    SIGNATURE    Dr. Dorethia Cave, M.D., F.C.C.P,  Pulmonary and Critical Care Medicine Staff Physician, [facility] Center Director - Interstitial Lung Disease  Program  Pulmonary Fibrosis [facility] at [facility] [place], [place], [address]   Pager: [phone], If no answer  -> Check AMION or Try [phone] Telephone (clinical office): [phone] Telephone (research): [phone]  10:11 AM 07/05/2024  "

## 2024-07-05 NOTE — Progress Notes (Signed)
Spiro/DLCO performed today. 

## 2024-07-11 NOTE — Telephone Encounter (Signed)
 Called patient [name] -> 6:36 PM 07/11/2024 and presented my discussion with Dr. Almetta who is supportive getting right heart catheterization.  After the procedure either through standard of care or through a research program sponsored by [employer] called [facility] [same procedure done by Dr. Ezra Shuck or Dr. Toribio Fuel 0 same procedures] at the same location.  She is supportive of going through the research protocol  She wanted to make sure Dr. Almetta was aligned.  I am sending of this message.  Also copying Dr. Donnice Beals was PI

## 2024-07-17 NOTE — CV Procedure (Signed)
" ° °  Electrical Cardioversion Procedure Note [name] [id number] 1940/08/14  Procedure: Electrical Cardioversion Indications:  Atrial Fibrillation  Time Out: Verified patient identification, verified procedure,medications/allergies/relevent history reviewed, required imaging and test results available.  Performed  Procedure Details  The patient signed informed consent.   The patient was NPO past midnight. Has had therapeutic anticoagulation with Eliquis  greater than 3 weeks. The patient denies any interruption of anticoagulation.  Anesthesia was administered by Anesthesiology.  Adequate airway was maintained throughout and vital followed per protocol.  He was cardioverted x 1 with 200J of biphasic synchronized energy.  He converted to NSR.  There were no apparent complications.  The patient tolerated the procedure well and had normal neuro status and respiratory status post procedure with vitals stable as recorded elsewhere.     IMPRESSION:  Successful cardioversion of atrial fibrillation   Follow up:  We will arrange follow up with primary cardiologist .  He will continue on current medical therapy.  The patient advised to continue anticoagulation.  Cristen Bredeson 07/17/2024, 4:46 PM   "

## 2024-07-25 ENCOUNTER — Encounter: Admitting: Pulmonary Disease

## 2024-07-25 DIAGNOSIS — J8489 Other specified interstitial pulmonary diseases: Secondary | ICD-10-CM

## 2024-07-25 NOTE — Research (Signed)
 [place]: PHINDER: Pulmonary Hypertension Screening in Patients with Interstitial Lung Disease for Earlier Detection   Primary Endpoint To prospectively evaluate screening strategies for pulmonary hypertension (PH) in patients with ILD in an effort to promote awareness and encourage screening for PH in this patient population. Results from this study will be used to identify and weigh specific clinical parameters based on their prognostic significance for right heart catheterization (RHC)-confirmed PH in patients with ILD.   Study Code: GMS-PH-001 Protocol Edition: Amendment 1 dated 20 Sep 2021 Sponsor Name: [employer] Primary Investigator: Dr. Adina Hunsucker   Protocol Version for 09/12/2023 date of 20 Sep 2021  Consent Version for 09/12/2023 date of 07 Feb 2022   Lab Manual: v1 dated 03 Sep 2021    Key Inclusion Criteria:  Patients are eligible if they have a diagnosis of ILD based on computed tomography imaging, including:  Idiopathic interstitial pneumonia, including idiopathic pulmonary fibrosis  Connective tissue disease-associated ILD with forced vital capacity (FVC) <70%  Hypersensitivity pneumonitis  Scleroderma-related ILD  Autoimmune ILD  Nonspecific interstitial pneumonia  Occupational lung disease  Combined pulmonary fibrosis and emphysema   Additionally, patients must meet a total of 2 or more criteria from at least 2 distinct categories below (ie, Category 1, Category 2, Category 3) based on the Investigator's clinical judgment, within 180 days of Screening.   Category 1: Clinical tests: a) Pulmonary function tests (PFTs)  meeting specified study guidelines for DLCO. b) Historical HRCT with specified findings consistent with increased pulmonary arterial pressure.   Category 2: Symptoms, oxygenation, exercise capacity, and brain natriuretic peptide/N-terminal pro-brain natriuretic peptide indicative of worsening status. Category 3:  Echocardiography Specified findings of increased RV systolic pressure or RV enlargement.   If a patient does not have a historical PFT, HRCT, or echocardiogram needed to assess eligibility for the study, these procedures will be performed as part of study screening.   Key Exclusion Criteria:  Patients are excluded if any of the following criteria apply:  Prior RHC with mean pulmonary artery pressure >20 mmHg  Currently on an FDA-approved pulmonary arterial hypertension medication  Diagnosed with chronic obstructive pulmonary disease  Uncontrolled or untreated sleep apnea  Pulmonary embolism within the past 3 months  History of ischemic heart disease or left-sided myocardial dysfunction within 12 months of Screening, defined as left ventricular ejection fraction <40% or pulmonary capillary wedge pressure >15 mmHg   Safety Data: Not Applicable.      [facility] [place] Clinical Research Coordinator note:    This visit for Subject [name] with DOB: 03-Oct-1940 on 07/05/2024 for the above protocol is Visit/Encounter # screening and is for purpose of research.    The consent for this encounter is under Protocol Version amendment 1,  Consent Version Revised [id number] and  is currently IRB approved.     The Subject was informed that the PI  continues to have oversight of the subject's visits and course through relevant discussions, reviews, and also specifically of this visit by routing of this note to the PI.   1. This visit is a key visit of screening. The PI is  available for this visit.     2.  In addition, ahead of the key visit of screeningt the visit and subject were discussed with the PI as part of direct PI oversight.    Consent reviewed, questions answered, and screen visit assessments completed.  Please see subject binder for further details.     Signed by   Octaviano  Aubrey Voong COT, CCRP Clinical Research Coordinator  [facility]  [place], [place]

## 2024-07-26 ENCOUNTER — Other Ambulatory Visit: Payer: Self-pay

## 2024-07-26 DIAGNOSIS — Z006 Encounter for examination for normal comparison and control in clinical research program: Secondary | ICD-10-CM

## 2024-07-29 ENCOUNTER — Telehealth: Payer: Self-pay

## 2024-07-30 ENCOUNTER — Other Ambulatory Visit (HOSPITAL_COMMUNITY): Payer: Self-pay

## 2024-07-30 DIAGNOSIS — Z006 Encounter for examination for normal comparison and control in clinical research program: Secondary | ICD-10-CM

## 2024-07-30 NOTE — Telephone Encounter (Signed)
 RHC scheduled for 08/16/24. Instructions given to patient over the phone and sent via my chart

## 2024-08-01 ENCOUNTER — Ambulatory Visit: Admitting: Physician Assistant

## 2024-08-01 DIAGNOSIS — M1712 Unilateral primary osteoarthritis, left knee: Secondary | ICD-10-CM

## 2024-08-01 MED ORDER — METHYLPREDNISOLONE ACETATE 40 MG/ML IJ SUSP
40.0000 mg | INTRAMUSCULAR | Status: AC | PRN
  Administered 2024-08-01: 40 mg via INTRA_ARTICULAR

## 2024-08-01 MED ORDER — LIDOCAINE HCL 1 % IJ SOLN
2.0000 mL | INTRAMUSCULAR | Status: AC | PRN
  Administered 2024-08-01: 2 mL

## 2024-08-01 MED ORDER — BUPIVACAINE HCL 0.25 % IJ SOLN
2.0000 mL | INTRAMUSCULAR | Status: AC | PRN
  Administered 2024-08-01: 2 mL via INTRA_ARTICULAR

## 2024-08-01 NOTE — Progress Notes (Signed)
 "  Office Visit Note   Patient: [name]           Date of Birth: 01/11/1941           MRN: [id number] Visit Date: 08/01/2024              Requested by: Cleotilde Planas, MD [address] [place],  [place] [address] PCP: Cleotilde Planas, MD   Assessment & Plan: Visit Diagnoses:  1. Unilateral primary osteoarthritis, left knee     Plan: Impression is left knee osteoarthritis.  Today we discussed repeat cortisone injection for which she would like to proceed.  She will follow-up as needed.  Follow-Up Instructions: Return if symptoms worsen or fail to improve.   Orders:  Orders Placed This Encounter  Procedures   Large Joint Inj: L knee   No orders of the defined types were placed in this encounter.     Procedures: Large Joint Inj: L knee on 08/01/2024 10:24 AM Indications: pain Details: 22 G needle, anterolateral approach Medications: 2 mL lidocaine  1 %; 2 mL bupivacaine  0.25 %; 40 mg methylPREDNISolone  acetate 40 MG/ML      Clinical Data: No additional findings.   Subjective: Chief Complaint  Patient presents with   Left Knee - Pain    HPI patient is a very pleasant 84 year old female who comes in today with recurrent left knee pain.  History of osteoarthritis.  She was seen by me in August of last year where cortisone injections performed.  She had great relief until about a month ago.  Her symptoms have gradually started to return.  Pain appears to be worse going from a seated to standing position but does somewhat improve after walking for a bit.  She has plans to travel to [place] in April and would like a repeat cortisone injection today.  Review of Systems as detailed in HPI.  All others reviewed and are negative.   Objective: Vital Signs: There were no vitals taken for this visit.  Physical Exam well-developed well-nourished female in no acute distress.  Alert and oriented x 3.  Ortho Exam left knee exam: Range of motion 0 to 120 degrees.  Trace  effusion.  No joint line tenderness.  She is neurovascular intact distally.  Specialty Comments:  May need valium pre-ESI  EXAM: MRI LUMBAR SPINE WITHOUT CONTRAST   TECHNIQUE: Multiplanar, multisequence MR imaging of the lumbar spine was performed. No intravenous contrast was administered.   COMPARISON:  MR lumbar 05/01/2015; X-ray lumbar 10/05/2021.   FINDINGS: Segmentation: Standard; the lowest formed disc space is designated L5-S1.   Alignment: There is mild levocurvature centered at L2. Trace anterolisthesis of L4 on L5 is unchanged. There is no other antero or retrolisthesis.   Vertebrae: There is mild compression deformity of the L3 vertebral body with up to approximately 10% loss of vertebral body height anteriorly, new since 2016, but without marrow edema to suggest acute or subacute fracture. The appearance is likely unchanged compared to the radiographs from 01/22/2019. The other vertebral body heights are preserved. Background marrow signal is normal. There is a small intraosseous hemangioma in the right L5 vertebral body. There is no suspicious marrow signal abnormality or marrow edema.   Conus medullaris and cauda equina: Conus extends to the L1 level. Conus and cauda equina appear normal.   Paraspinal and other soft tissues: Unremarkable.   Disc levels:   There is mild multilevel disc desiccation and narrowing, overall progressed since 2016. There is multilevel facet  arthropathy, most advanced at L4-L5 and worse on the right at this level.   T12-L1: No significant spinal canal or neural foraminal stenosis   L1-L2: There is a mild disc bulge eccentric to the left and mild facet arthropathy resulting in mild left and no significant right neural foraminal stenosis and no significant spinal canal stenosis, not significantly changed.   L2-L3: There is a mild disc bulge and mild bilateral facet arthropathy resulting in mild right and no significant left  neural foraminal stenosis without significant spinal canal stenosis minimally progressed since 2016.   L3-L4: There is a mild disc bulge and mild bilateral facet arthropathy resulting in mild narrowing of the left subarticular zone without evidence of frank nerve root impingement and mild left and no significant right neural foraminal stenosis slightly progressed since 2016.   L4-L5: There is a mild disc bulge and mild bilateral facet arthropathy with a trace right effusion without significant spinal canal or neural foraminal stenosis. The facet arthropathy has slightly progressed since 2016   L5-S1: There is a broad-based left subarticular/foraminal protrusion and mild bilateral facet arthropathy. The protrusion abuts but does not appear to impinge the traversing left S1 nerve root. There is mild left and no significant right neural foraminal stenosis. Findings are slightly progressed since 2016.   IMPRESSION: 1. Mild anterior compression deformity of the L3 vertebral body is new since 2016 but without marrow edema to suggest acute fracture, similar in appearance to the radiographs from 2020. Otherwise, no evidence of acute injury in the lumbar spine. 2. Multilevel facet arthropathy, most advanced at L4-L5 and worse on the right, slightly progressed since 2016. 3. Otherwise, overall mild multilevel degenerative changes as detailed above have slightly progressed since 2016, but with no high-grade spinal canal or neural foraminal stenosis, and no evidence of nerve root impingement to explain the patient's right-sided pain. 4. Mild levoscoliosis centered at L2.     Electronically Signed By: Maude Harry M.D. On: 10/11/2021 10:04  Imaging: No results found.   PMFS History: Patient Active Problem List   Diagnosis Date Noted   Paroxysmal atrial fibrillation with RVR (HCC) 11/20/2023   Malnutrition of moderate degree 06/25/2022   Closed compression fracture of L2 lumbar  vertebra, initial encounter (HCC) 06/21/2022   Intractable back pain 06/20/2022   High risk medication use 02/18/2019   Therapeutic drug monitoring 02/18/2019   Healthcare maintenance 02/18/2019   ILD (interstitial lung disease) (HCC) 11/28/2016   Multiple lung nodules on CT 11/28/2016   Abnormal laboratory test 11/18/2016   DDD L spine 11/18/2016   Scl-70 antibody positive 11/10/2016   History of hypothyroidism 11/10/2016   History of hemochromatosis 11/10/2016   Pulmonary emphysema (HCC) 08/31/2016   [name] phenomenon without gangrene 08/31/2016   Sinusitis, chronic 08/31/2016   Chronic cough 02/10/2016   Irritable larynx 02/10/2016   Stopped smoking with greater than 40 pack year history 02/10/2016   Chest pain, unspecified 10/06/2010   Shortness of breath 10/06/2010   Hypothyroidism    Hereditary hemochromatosis 04/22/2010   ATRIAL FIBRILLATION 10/20/2008   PAC 10/20/2008   Syncope and collapse 10/20/2008   Past Medical History:  Diagnosis Date   Anxiety    hx of- no longer needs medication   Atrial fibrillation (HCC)    a. Flecainide  therapy;  b. event monitor 4/12   Chest pain    a. GXT myoview  4/12: no isch., EF 86%;   b. echo 4/12: EF 55-65%, grade 1 diast dysfxn, LAE   GERD (  gastroesophageal reflux disease)    Hemochromatosis    indentified by the C282Y gene mutation; Dr. Freddie   History of syncope    Hypothyroidism    Osteoporosis     Family History  Problem Relation Age of Onset   Stroke Mother    Heart disease Father 41   Hemochromatosis Brother    Breast cancer Neg Hx    Colon cancer Neg Hx    Esophageal cancer Neg Hx    Rectal cancer Neg Hx    Stomach cancer Neg Hx     Past Surgical History:  Procedure Laterality Date   CARDIOVERSION N/A 05/29/2024   Procedure: CARDIOVERSION;  Surgeon: Sheena Pugh, DO;  Location: [facility];  Service: Cardiovascular;  Laterality: N/A;   COLONOSCOPY     IR [facility]PHO LUMBAR INC FX REDUCE BONE BX  UNI/BIL CANNULATION INC/IMAGING  06/30/2022   IR [facility]PH[facility] LUMBAR INC FX REDUCE BONE BX UNI/BIL CANNULATION INC/IMAGING  08/16/2022   MOUTH SURGERY     SHOULDER ARTHROSCOPY Left    TOTAL ABDOMINAL HYSTERECTOMY     UPPER GASTROINTESTINAL ENDOSCOPY     VARICOSE VEIN SURGERY     Social History   Occupational History   Occupation: RETIRED    Employer: RETIRED   Occupation: retired  Tobacco Use   Smoking status: Former    Current packs/day: 0.00    Average packs/day: 1 pack/day for 42.0 years (42.0 ttl pk-yrs)    Types: Cigarettes    Start date: 06/28/1955    Quit date: 06/27/1997    Years since quitting: 27.1    Passive exposure: Never   Smokeless tobacco: Never  Vaping Use   Vaping status: Never Used  Substance and Sexual Activity   Alcohol  use: Yes    Comment: occ   Drug use: No   Sexual activity: Not on file        "

## 2024-08-14 ENCOUNTER — Ambulatory Visit (HOSPITAL_COMMUNITY)

## 2024-08-16 ENCOUNTER — Encounter (HOSPITAL_COMMUNITY): Payer: Self-pay

## 2024-08-16 ENCOUNTER — Ambulatory Visit (HOSPITAL_COMMUNITY): Admit: 2024-08-16 | Admitting: Cardiology

## 2024-08-22 ENCOUNTER — Encounter (INDEPENDENT_AMBULATORY_CARE_PROVIDER_SITE_OTHER): Payer: Medicare Other | Admitting: Ophthalmology

## 2024-10-23 ENCOUNTER — Other Ambulatory Visit

## 2024-10-23 ENCOUNTER — Ambulatory Visit: Admitting: Hematology and Oncology

## 2024-10-25 ENCOUNTER — Ambulatory Visit: Admitting: Internal Medicine

## 2024-11-20 ENCOUNTER — Ambulatory Visit: Admitting: Rheumatology
